# Patient Record
Sex: Male | Born: 1970
Health system: Southern US, Community
[De-identification: ages and names within clinical notes are randomized; demographics above are authoritative.]

## PROBLEM LIST (undated history)

## (undated) DIAGNOSIS — I639 Cerebral infarction, unspecified: Secondary | ICD-10-CM

## (undated) DIAGNOSIS — C8290 Follicular lymphoma, unspecified, unspecified site: Secondary | ICD-10-CM

## (undated) DIAGNOSIS — E7800 Pure hypercholesterolemia, unspecified: Secondary | ICD-10-CM

## (undated) DIAGNOSIS — R7303 Prediabetes: Secondary | ICD-10-CM

## (undated) DIAGNOSIS — M7989 Other specified soft tissue disorders: Secondary | ICD-10-CM

## (undated) DIAGNOSIS — R002 Palpitations: Secondary | ICD-10-CM

## (undated) DIAGNOSIS — M2550 Pain in unspecified joint: Secondary | ICD-10-CM

## (undated) DIAGNOSIS — Z91018 Allergy to other foods: Secondary | ICD-10-CM

## (undated) DIAGNOSIS — E119 Type 2 diabetes mellitus without complications: Secondary | ICD-10-CM

## (undated) DIAGNOSIS — R5383 Other fatigue: Secondary | ICD-10-CM

## (undated) DIAGNOSIS — Z9889 Other specified postprocedural states: Secondary | ICD-10-CM

## (undated) DIAGNOSIS — R0602 Shortness of breath: Secondary | ICD-10-CM

## (undated) DIAGNOSIS — C8280 Other types of follicular lymphoma, unspecified site: Secondary | ICD-10-CM

## (undated) DIAGNOSIS — E739 Lactose intolerance, unspecified: Secondary | ICD-10-CM

## (undated) DIAGNOSIS — G4730 Sleep apnea, unspecified: Secondary | ICD-10-CM

## (undated) DIAGNOSIS — T7840XA Allergy, unspecified, initial encounter: Secondary | ICD-10-CM

## (undated) DIAGNOSIS — F419 Anxiety disorder, unspecified: Secondary | ICD-10-CM

## (undated) DIAGNOSIS — R112 Nausea with vomiting, unspecified: Secondary | ICD-10-CM

## (undated) HISTORY — PX: SUTURE REMOVAL: SHX1060

## (undated) HISTORY — DX: Lactose intolerance, unspecified: E73.9

## (undated) HISTORY — DX: Other specified soft tissue disorders: M79.89

## (undated) HISTORY — DX: Cerebral infarction, unspecified: I63.9

## (undated) HISTORY — DX: Palpitations: R00.2

## (undated) HISTORY — PX: ACHILLES TENDON SURGERY: SHX542

## (undated) HISTORY — DX: Shortness of breath: R06.02

## (undated) HISTORY — DX: Prediabetes: R73.03

## (undated) HISTORY — DX: Type 2 diabetes mellitus without complications: E11.9

## (undated) HISTORY — DX: Allergy to other foods: Z91.018

## (undated) HISTORY — DX: Pain in unspecified joint: M25.50

## (undated) HISTORY — PX: HERNIA REPAIR: SHX51

## (undated) HISTORY — DX: Other types of follicular lymphoma, unspecified site: C82.80

## (undated) HISTORY — DX: Allergy, unspecified, initial encounter: T78.40XA

## (undated) HISTORY — DX: Other fatigue: R53.83

---
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
## 1992-02-03 HISTORY — PX: VENTRAL HERNIA REPAIR: SHX424

## 2009-10-17 ENCOUNTER — Encounter: Payer: Self-pay | Admitting: Family Medicine

## 2009-10-28 ENCOUNTER — Ambulatory Visit: Payer: Self-pay | Admitting: Sports Medicine

## 2009-10-28 ENCOUNTER — Encounter: Payer: Self-pay | Admitting: Family Medicine

## 2009-10-28 DIAGNOSIS — M7660 Achilles tendinitis, unspecified leg: Secondary | ICD-10-CM | POA: Insufficient documentation

## 2009-11-04 ENCOUNTER — Encounter: Payer: Self-pay | Admitting: Family Medicine

## 2010-01-12 ENCOUNTER — Encounter: Payer: Self-pay | Admitting: Family Medicine

## 2010-03-04 NOTE — Letter (Signed)
Summary: [facility]  [facility]   Imported By: Marily Memos 10/18/2009 11:51:56  _____________________________________________________________________  External Attachment:    Type:   Image     Comment:   External Document

## 2010-03-04 NOTE — Consult Note (Signed)
Summary: [facility]   Imported By: Marily Memos 11/11/2009 11:34:10  _____________________________________________________________________  External Attachment:    Type:   Image     Comment:   External Document

## 2010-03-04 NOTE — Letter (Signed)
Summary: [facility]  [facility]   Imported By: Marily Memos 10/29/2009 15:06:26  _____________________________________________________________________  External Attachment:    Type:   Image     Comment:   External Document

## 2010-03-04 NOTE — Assessment & Plan Note (Signed)
Summary: NP,U/S ACHILLIES TENDON RUPTURE,MC   Vital Signs:  Patient profile:   40 year old male Height:      71.75 inches Weight:      210 pounds BMI:     28.78 Pulse rate:   83 / minute BP sitting:   112 / 75  (right arm) CC: swollen l achilles x 1.5 months   CC:  swollen l achilles x 1.5 months.  History of Present Illness: Left achilles swelling with mild intermittent pain for several months. Increasing in size. recals no specific injury--did have surgery on this foot as a  child for congenital club foot--was in a cast for a long time and has had chronic calf atrophy since. Has nbeen wearing a cam walker boot for last week and that seems to help a litle bit. Hikes seveal times a week until about a mointh ago when this started to bother him. Tendon has continued to swell  in a nodular area.  No new activities. Recalls no specific injury.   Current Medications (verified): 1)  None  Past History:  Past Surgical History: right foot surgery as a child for congenital club foot  Social History: graduate srudent in accounting---former banker  Review of Systems  The patient denies anorexia, fever, and muscle weakness.         over last year he has lost 60 pounds, intentional and related to the stress of his employment status  Physical Exam  General:  alert, well-developed, well-nourished, and well-hydrated.   Msk:  LEFT calf is smaller than right, itis symmetrical with no obvious defect other than the 2 cm firm nodule in the achilles that is just proximal to the insertion of the achilles on the  calcaneus. Nonmobile. Plantar and dorsiflexion sytrength is 5/5.ROM is  intact i.  SKIN no redness, no lesions or skin changes around the area of the nodule.  NEURO: intact sensation to soft touch B in feet and lower legs  PUKSES: 2+ B = DP with normal cap refill. Additional Exam:  [place] Left achilles. Right achilles was ultrasounded for comparison and revealed a normal achilles in  architecture and size. The LEFT achilles was normal in architecture except fo rthe area of the nodule--at this area there was a swirl effect of the architecture that was inhomogeneous. No obvious defectnoted but definite abnormal architecture of  the nodule area seen on clinical exam. At the actual insertion onto the calcaneous, the tendon architecture and returned to normal.   Impression & Recommendations:  Problem # 1:  ACHILLES BURSITIS OR TENDINITIS (ICD-726.71)  Orders: Orthopedic Surgeon Referral (Ortho Surgeon) [place] LIMITED [phone]) This may be scar from a partial rupture or it could be from  his surgery as a child. Odd  taht it has en;larged so rapidly in last 1-2 months. Cannot definitively rule out intratendinous tumor. Refer. [place] images are printed and given to patient to take with him.  Patient Instructions: 1)  Appointment with Dr. Lajoyce Corners 11/04/09 @ 1:15pm. [address]. [phone].

## 2010-03-06 NOTE — Consult Note (Signed)
Summary: [facility]   Imported By: Marily Memos 01/14/2010 14:41:58  _____________________________________________________________________  External Attachment:    Type:   Image     Comment:   External Document

## 2011-06-25 DIAGNOSIS — Z720 Tobacco use: Secondary | ICD-10-CM | POA: Insufficient documentation

## 2015-11-12 ENCOUNTER — Other Ambulatory Visit: Payer: Self-pay | Admitting: Family

## 2015-11-12 ENCOUNTER — Emergency Department (HOSPITAL_COMMUNITY): Payer: 59 | Admitting: Anesthesiology

## 2015-11-12 ENCOUNTER — Encounter (HOSPITAL_COMMUNITY): Admission: EM | Disposition: A | Discharge status: Home / Self Care | Payer: Self-pay | Source: Home / Self Care

## 2015-11-12 ENCOUNTER — Encounter (HOSPITAL_COMMUNITY): Payer: Self-pay | Admitting: Emergency Medicine

## 2015-11-12 ENCOUNTER — Emergency Department (HOSPITAL_COMMUNITY): Payer: 59

## 2015-11-12 ENCOUNTER — Inpatient Hospital Stay (HOSPITAL_COMMUNITY)
Admission: EM | Admit: 2015-11-12 | Discharge: 2015-11-18 | DRG: 339 | Disposition: A | Discharge status: Home / Self Care | Payer: 59 | Attending: General Surgery | Admitting: General Surgery

## 2015-11-12 DIAGNOSIS — K3580 Unspecified acute appendicitis: Secondary | ICD-10-CM | POA: Diagnosis present

## 2015-11-12 DIAGNOSIS — K353 Acute appendicitis with localized peritonitis, without perforation or gangrene: Secondary | ICD-10-CM

## 2015-11-12 DIAGNOSIS — R1031 Right lower quadrant pain: Secondary | ICD-10-CM | POA: Diagnosis not present

## 2015-11-12 DIAGNOSIS — R339 Retention of urine, unspecified: Secondary | ICD-10-CM | POA: Diagnosis not present

## 2015-11-12 DIAGNOSIS — E669 Obesity, unspecified: Secondary | ICD-10-CM | POA: Diagnosis present

## 2015-11-12 DIAGNOSIS — R739 Hyperglycemia, unspecified: Secondary | ICD-10-CM | POA: Diagnosis present

## 2015-11-12 DIAGNOSIS — E785 Hyperlipidemia, unspecified: Secondary | ICD-10-CM | POA: Diagnosis present

## 2015-11-12 DIAGNOSIS — R000 Tachycardia, unspecified: Secondary | ICD-10-CM | POA: Diagnosis not present

## 2015-11-12 DIAGNOSIS — Z6830 Body mass index (BMI) 30.0-30.9, adult: Secondary | ICD-10-CM

## 2015-11-12 DIAGNOSIS — K567 Ileus, unspecified: Secondary | ICD-10-CM | POA: Diagnosis not present

## 2015-11-12 DIAGNOSIS — K3532 Acute appendicitis with perforation and localized peritonitis, without abscess: Secondary | ICD-10-CM | POA: Diagnosis present

## 2015-11-12 DIAGNOSIS — F17200 Nicotine dependence, unspecified, uncomplicated: Secondary | ICD-10-CM | POA: Diagnosis present

## 2015-11-12 HISTORY — DX: Pure hypercholesterolemia, unspecified: E78.00

## 2015-11-12 HISTORY — DX: Other specified postprocedural states: Z98.890

## 2015-11-12 HISTORY — DX: Sleep apnea, unspecified: G47.30

## 2015-11-12 HISTORY — PX: APPENDECTOMY: SHX54

## 2015-11-12 HISTORY — DX: Other specified postprocedural states: R11.2

## 2015-11-12 HISTORY — PX: LAPAROSCOPIC APPENDECTOMY: SHX408

## 2015-11-12 HISTORY — DX: Nausea with vomiting, unspecified: R11.2

## 2015-11-12 LAB — COMPREHENSIVE METABOLIC PANEL
ALBUMIN: 3.8 g/dL (ref 3.5–5.0)
ALK PHOS: 83 U/L (ref 38–126)
ALT: 17 U/L (ref 17–63)
ANION GAP: 8 (ref 5–15)
AST: 18 U/L (ref 15–41)
BUN: 8 mg/dL (ref 6–20)
CALCIUM: 9.4 mg/dL (ref 8.9–10.3)
CHLORIDE: 102 mmol/L (ref 101–111)
CO2: 28 mmol/L (ref 22–32)
Creatinine, Ser: 1.06 mg/dL (ref 0.61–1.24)
GFR calc Af Amer: >60 mL/min (ref >60)
GFR calc non Af Amer: >60 mL/min (ref >60)
GLUCOSE: 123 mg/dL — AB (ref 65–99)
Potassium: 4.6 mmol/L (ref 3.5–5.1)
SODIUM: 138 mmol/L (ref 135–145)
TOTAL PROTEIN: 7.1 g/dL (ref 6.5–8.1)
Total Bilirubin: 0.7 mg/dL (ref 0.3–1.2)

## 2015-11-12 LAB — CBC
HEMATOCRIT: 42.4 % (ref 39.0–52.0)
HEMOGLOBIN: 14.6 g/dL (ref 13.0–17.0)
MCH: 31.3 pg (ref 26.0–34.0)
MCHC: 34.4 g/dL (ref 30.0–36.0)
MCV: 90.8 fL (ref 78.0–100.0)
Platelets: 229 10*3/uL (ref 150–400)
RBC: 4.67 MIL/uL (ref 4.22–5.81)
RDW: 12.9 % (ref 11.5–15.5)
WBC: 12.9 10*3/uL — AB (ref 4.0–10.5)

## 2015-11-12 LAB — URINALYSIS, ROUTINE W REFLEX MICROSCOPIC
BILIRUBIN URINE: NEGATIVE
Glucose, UA: NEGATIVE mg/dL
Hgb urine dipstick: NEGATIVE
Ketones, ur: NEGATIVE mg/dL
LEUKOCYTES UA: NEGATIVE
NITRITE: NEGATIVE
PH: 6 (ref 5.0–8.0)
Protein, ur: NEGATIVE mg/dL
SPECIFIC GRAVITY, URINE: 1.01 (ref 1.005–1.030)

## 2015-11-12 LAB — LIPASE, BLOOD: LIPASE: 19 U/L (ref 11–51)

## 2015-11-12 LAB — GLUCOSE, CAPILLARY: Glucose-Capillary: 161 mg/dL — ABNORMAL HIGH (ref 65–99)

## 2015-11-12 SURGERY — APPENDECTOMY, LAPAROSCOPIC
Anesthesia: General | Site: Abdomen

## 2015-11-12 MED ORDER — BUPIVACAINE-EPINEPHRINE 0.25% -1:200000 IJ SOLN
INTRAMUSCULAR | Status: DC | PRN
  Administered 2015-11-12: 8 mL

## 2015-11-12 MED ORDER — LACTATED RINGERS IV SOLN
INTRAVENOUS | Status: DC
  Administered 2015-11-12 (×4): via INTRAVENOUS

## 2015-11-12 MED ORDER — INSULIN ASPART 100 UNIT/ML ~~LOC~~ SOLN
0.0000 [IU] | SUBCUTANEOUS | Status: DC
  Administered 2015-11-12: 2 [IU] via SUBCUTANEOUS
  Administered 2015-11-13 (×3): 1 [IU] via SUBCUTANEOUS
  Administered 2015-11-13: 2 [IU] via SUBCUTANEOUS
  Administered 2015-11-13: 1 [IU] via SUBCUTANEOUS
  Administered 2015-11-14 (×2): 2 [IU] via SUBCUTANEOUS
  Administered 2015-11-14: 1 [IU] via SUBCUTANEOUS
  Administered 2015-11-14 (×3): 2 [IU] via SUBCUTANEOUS
  Administered 2015-11-15 (×2): 1 [IU] via SUBCUTANEOUS
  Administered 2015-11-15 (×3): 2 [IU] via SUBCUTANEOUS
  Administered 2015-11-16: 1 [IU] via SUBCUTANEOUS

## 2015-11-12 MED ORDER — FENTANYL CITRATE (PF) 100 MCG/2ML IJ SOLN
INTRAMUSCULAR | Status: AC
  Filled 2015-11-12: qty 2

## 2015-11-12 MED ORDER — ROCURONIUM BROMIDE 100 MG/10ML IV SOLN
INTRAVENOUS | Status: DC | PRN
  Administered 2015-11-12 (×2): 10 mg via INTRAVENOUS
  Administered 2015-11-12: 20 mg via INTRAVENOUS
  Administered 2015-11-12: 10 mg via INTRAVENOUS
  Administered 2015-11-12: 50 mg via INTRAVENOUS
  Administered 2015-11-12: 20 mg via INTRAVENOUS
  Administered 2015-11-12: 30 mg via INTRAVENOUS

## 2015-11-12 MED ORDER — FAMOTIDINE IN NACL 20-0.9 MG/50ML-% IV SOLN
20.0000 mg | Freq: Two times a day (BID) | INTRAVENOUS | Status: DC
  Administered 2015-11-12 – 2015-11-17 (×10): 20 mg via INTRAVENOUS
  Filled 2015-11-12 (×11): qty 50

## 2015-11-12 MED ORDER — ACETAMINOPHEN 325 MG PO TABS
650.0000 mg | ORAL_TABLET | Freq: Four times a day (QID) | ORAL | Status: DC | PRN
  Filled 2015-11-12: qty 2

## 2015-11-12 MED ORDER — HYDROMORPHONE HCL 1 MG/ML IJ SOLN
2.0000 mg | Freq: Once | INTRAMUSCULAR | Status: AC
  Administered 2015-11-12: 2 mg via INTRAVENOUS
  Filled 2015-11-12: qty 2

## 2015-11-12 MED ORDER — MIDAZOLAM HCL 2 MG/2ML IJ SOLN
INTRAMUSCULAR | Status: AC
  Filled 2015-11-12: qty 2

## 2015-11-12 MED ORDER — SODIUM CHLORIDE 0.9 % IV BOLUS (SEPSIS)
1000.0000 mL | Freq: Once | INTRAVENOUS | Status: AC
  Administered 2015-11-12: 1000 mL via INTRAVENOUS

## 2015-11-12 MED ORDER — BUPIVACAINE-EPINEPHRINE 0.25% -1:200000 IJ SOLN
INTRAMUSCULAR | Status: AC
  Filled 2015-11-12: qty 1

## 2015-11-12 MED ORDER — MIDAZOLAM HCL 5 MG/5ML IJ SOLN
INTRAMUSCULAR | Status: DC | PRN
Start: 2015-11-12 — End: 2015-11-12
  Administered 2015-11-12: 2 mg via INTRAVENOUS

## 2015-11-12 MED ORDER — ACETAMINOPHEN 650 MG RE SUPP
650.0000 mg | Freq: Four times a day (QID) | RECTAL | Status: DC | PRN
  Administered 2015-11-13 (×2): 650 mg via RECTAL
  Filled 2015-11-12 (×2): qty 1

## 2015-11-12 MED ORDER — HYDROMORPHONE HCL 1 MG/ML IJ SOLN
0.5000 mg | INTRAMUSCULAR | Status: DC | PRN
  Administered 2015-11-13: 1 mg via INTRAVENOUS
  Administered 2015-11-13 (×2): 2 mg via INTRAVENOUS
  Filled 2015-11-12: qty 2
  Filled 2015-11-12: qty 1
  Filled 2015-11-12: qty 2

## 2015-11-12 MED ORDER — PHENYLEPHRINE HCL 10 MG/ML IJ SOLN
INTRAMUSCULAR | Status: DC | PRN
  Administered 2015-11-12 (×3): 80 ug via INTRAVENOUS

## 2015-11-12 MED ORDER — ONDANSETRON HCL 4 MG/2ML IJ SOLN: 4.0000 mg | Freq: Four times a day (QID) | INTRAMUSCULAR | Status: DC | PRN

## 2015-11-12 MED ORDER — PROMETHAZINE HCL 25 MG/ML IJ SOLN: 6.2500 mg | INTRAMUSCULAR | Status: DC | PRN

## 2015-11-12 MED ORDER — PROPOFOL 10 MG/ML IV BOLUS
INTRAVENOUS | Status: AC
  Filled 2015-11-12: qty 20

## 2015-11-12 MED ORDER — SUGAMMADEX SODIUM 500 MG/5ML IV SOLN
INTRAVENOUS | Status: DC | PRN
  Administered 2015-11-12: 200 mg via INTRAVENOUS

## 2015-11-12 MED ORDER — 0.9 % SODIUM CHLORIDE (POUR BTL) OPTIME
TOPICAL | Status: DC | PRN
  Administered 2015-11-12: 1000 mL

## 2015-11-12 MED ORDER — PROPOFOL 10 MG/ML IV BOLUS
INTRAVENOUS | Status: DC | PRN
  Administered 2015-11-12: 200 mg via INTRAVENOUS

## 2015-11-12 MED ORDER — SODIUM CHLORIDE 0.9 % IR SOLN
Status: DC | PRN
Start: 2015-11-12 — End: 2015-11-12
  Administered 2015-11-12 (×4): 1000 mL

## 2015-11-12 MED ORDER — FENTANYL CITRATE (PF) 100 MCG/2ML IJ SOLN
INTRAMUSCULAR | Status: AC
  Filled 2015-11-12: qty 4

## 2015-11-12 MED ORDER — SUGAMMADEX SODIUM 200 MG/2ML IV SOLN
INTRAVENOUS | Status: AC
  Filled 2015-11-12: qty 2

## 2015-11-12 MED ORDER — ONDANSETRON 4 MG PO TBDP: 4.0000 mg | ORAL_TABLET | Freq: Four times a day (QID) | ORAL | Status: DC | PRN

## 2015-11-12 MED ORDER — CEFOXITIN SODIUM 1 G IV SOLR
INTRAVENOUS | Status: AC
  Filled 2015-11-12 (×2): qty 1

## 2015-11-12 MED ORDER — PHENYLEPHRINE 40 MCG/ML (10ML) SYRINGE FOR IV PUSH (FOR BLOOD PRESSURE SUPPORT)
PREFILLED_SYRINGE | INTRAVENOUS | Status: AC
  Filled 2015-11-12: qty 10

## 2015-11-12 MED ORDER — DEXTROSE 5 % IV SOLN
2.0000 g | INTRAVENOUS | Status: AC
  Administered 2015-11-12: 2 g via INTRAVENOUS
  Filled 2015-11-12: qty 2

## 2015-11-12 MED ORDER — MORPHINE SULFATE (PF) 2 MG/ML IV SOLN
1.0000 mg | INTRAVENOUS | Status: DC | PRN
  Administered 2015-11-12: 2 mg via INTRAVENOUS
  Filled 2015-11-12: qty 1

## 2015-11-12 MED ORDER — KCL IN DEXTROSE-NACL 20-5-0.45 MEQ/L-%-% IV SOLN
INTRAVENOUS | Status: DC
  Administered 2015-11-12 – 2015-11-14 (×5): via INTRAVENOUS
  Administered 2015-11-15: 1 mL via INTRAVENOUS
  Administered 2015-11-15 – 2015-11-18 (×5): via INTRAVENOUS
  Filled 2015-11-12 (×11): qty 1000

## 2015-11-12 MED ORDER — ONDANSETRON HCL 4 MG/2ML IJ SOLN
INTRAMUSCULAR | Status: DC | PRN
  Administered 2015-11-12: 4 mg via INTRAVENOUS

## 2015-11-12 MED ORDER — DIPHENHYDRAMINE HCL 50 MG/ML IJ SOLN: 12.5000 mg | Freq: Four times a day (QID) | INTRAMUSCULAR | Status: DC | PRN

## 2015-11-12 MED ORDER — FENTANYL CITRATE (PF) 100 MCG/2ML IJ SOLN
INTRAMUSCULAR | Status: DC | PRN
  Administered 2015-11-12 (×2): 50 ug via INTRAVENOUS
  Administered 2015-11-12: 100 ug via INTRAVENOUS
  Administered 2015-11-12 (×8): 50 ug via INTRAVENOUS

## 2015-11-12 MED ORDER — ONDANSETRON HCL 4 MG/2ML IJ SOLN
4.0000 mg | Freq: Four times a day (QID) | INTRAMUSCULAR | Status: DC | PRN
  Administered 2015-11-14: 4 mg via INTRAVENOUS
  Filled 2015-11-12 (×2): qty 2

## 2015-11-12 MED ORDER — DIPHENHYDRAMINE HCL 12.5 MG/5ML PO ELIX: 12.5000 mg | ORAL_SOLUTION | Freq: Four times a day (QID) | ORAL | Status: DC | PRN

## 2015-11-12 MED ORDER — ENOXAPARIN SODIUM 40 MG/0.4ML ~~LOC~~ SOLN
40.0000 mg | SUBCUTANEOUS | Status: DC
Start: 2015-11-13 — End: 2015-11-18
  Administered 2015-11-13 – 2015-11-17 (×5): 40 mg via SUBCUTANEOUS
  Filled 2015-11-12 (×5): qty 0.4

## 2015-11-12 MED ORDER — IOPAMIDOL (ISOVUE-300) INJECTION 61%
INTRAVENOUS | Status: AC
  Administered 2015-11-12: 100 mL
  Filled 2015-11-12: qty 100

## 2015-11-12 MED ORDER — PIPERACILLIN-TAZOBACTAM 3.375 G IVPB
3.3750 g | Freq: Three times a day (TID) | INTRAVENOUS | Status: DC
  Administered 2015-11-12 – 2015-11-18 (×17): 3.375 g via INTRAVENOUS
  Filled 2015-11-12 (×19): qty 50

## 2015-11-12 MED ORDER — SUCCINYLCHOLINE CHLORIDE 20 MG/ML IJ SOLN
INTRAMUSCULAR | Status: DC | PRN
  Administered 2015-11-12: 120 mg via INTRAVENOUS

## 2015-11-12 MED ORDER — FENTANYL CITRATE (PF) 100 MCG/2ML IJ SOLN
25.0000 ug | INTRAMUSCULAR | Status: DC | PRN
Start: 2015-11-12 — End: 2015-11-12
  Administered 2015-11-12 (×2): 50 ug via INTRAVENOUS

## 2015-11-12 MED ORDER — PROMETHAZINE HCL 25 MG/ML IJ SOLN: 12.5000 mg | Freq: Four times a day (QID) | INTRAMUSCULAR | Status: DC | PRN

## 2015-11-12 SURGICAL SUPPLY — 70 items
APPLIER CLIP ROT 10 11.4 M/L (STAPLE) ×3
BANDAGE ADH SHEER 1  50/CT (GAUZE/BANDAGES/DRESSINGS) IMPLANT
BENZOIN TINCTURE PRP APPL 2/3 (GAUZE/BANDAGES/DRESSINGS) ×3 IMPLANT
BLADE SURG ROTATE 9660 (MISCELLANEOUS) ×3 IMPLANT
CANISTER SUCTION 2500CC (MISCELLANEOUS) ×3 IMPLANT
CHLORAPREP W/TINT 26ML (MISCELLANEOUS) ×3 IMPLANT
CLIP APPLIE ROT 10 11.4 M/L (STAPLE) ×1 IMPLANT
CLOSURE WOUND 1/2 X4 (GAUZE/BANDAGES/DRESSINGS)
COVER SURGICAL LIGHT HANDLE (MISCELLANEOUS) ×3 IMPLANT
CUTTER FLEX LINEAR 45M (STAPLE) ×3 IMPLANT
DERMABOND ADVANCED (GAUZE/BANDAGES/DRESSINGS)
DERMABOND ADVANCED .7 DNX12 (GAUZE/BANDAGES/DRESSINGS) IMPLANT
DEVICE TROCAR PUNCTURE CLOSURE (ENDOMECHANICALS) ×3 IMPLANT
DRAIN CHANNEL 19F RND (DRAIN) ×3 IMPLANT
DRAPE HALF SHEET 40X57 (DRAPES) ×3 IMPLANT
DRSG TEGADERM 2-3/8X2-3/4 SM (GAUZE/BANDAGES/DRESSINGS) ×6 IMPLANT
DRSG TEGADERM 4X4.75 (GAUZE/BANDAGES/DRESSINGS) IMPLANT
ELECT REM PT RETURN 9FT ADLT (ELECTROSURGICAL) ×3
ELECTRODE REM PT RTRN 9FT ADLT (ELECTROSURGICAL) ×1 IMPLANT
ENDOLOOP SUT PDS II  0 18 (SUTURE) ×2
ENDOLOOP SUT PDS II 0 18 (SUTURE) ×1 IMPLANT
EVACUATOR SILICONE 100CC (DRAIN) ×3 IMPLANT
GAUZE SPONGE 2X2 8PLY STRL LF (GAUZE/BANDAGES/DRESSINGS) ×1 IMPLANT
GLOVE BIOGEL M STRL SZ7.5 (GLOVE) ×3 IMPLANT
GLOVE BIOGEL PI IND STRL 7.5 (GLOVE) ×1 IMPLANT
GLOVE BIOGEL PI IND STRL 8 (GLOVE) ×1 IMPLANT
GLOVE BIOGEL PI IND STRL 8.5 (GLOVE) ×1 IMPLANT
GLOVE BIOGEL PI INDICATOR 7.5 (GLOVE) ×2
GLOVE BIOGEL PI INDICATOR 8 (GLOVE) ×2
GLOVE BIOGEL PI INDICATOR 8.5 (GLOVE) ×2
GLOVE ECLIPSE 6.5 STRL STRAW (GLOVE) ×3 IMPLANT
GLOVE SURG SS PI 7.5 STRL IVOR (GLOVE) ×3 IMPLANT
GOWN BRE IMP SLV AUR LG STRL (GOWN DISPOSABLE) ×9 IMPLANT
GOWN STRL REUS W/ TWL LRG LVL3 (GOWN DISPOSABLE) ×2 IMPLANT
GOWN STRL REUS W/ TWL LRG LVL4 (GOWN DISPOSABLE) ×1 IMPLANT
GOWN STRL REUS W/TWL 2XL LVL3 (GOWN DISPOSABLE) ×3 IMPLANT
GOWN STRL REUS W/TWL LRG LVL3 (GOWN DISPOSABLE) ×4
GOWN STRL REUS W/TWL LRG LVL4 (GOWN DISPOSABLE) ×2
GRASPER SUT TROCAR 14GX15 (MISCELLANEOUS) ×3 IMPLANT
KIT BASIN OR (CUSTOM PROCEDURE TRAY) ×3 IMPLANT
KIT ROOM TURNOVER OR (KITS) ×3 IMPLANT
NS IRRIG 1000ML POUR BTL (IV SOLUTION) ×3 IMPLANT
PAD ARMBOARD 7.5X6 YLW CONV (MISCELLANEOUS) ×6 IMPLANT
POUCH RETRIEVAL ECOSAC 10 (ENDOMECHANICALS) ×1 IMPLANT
POUCH RETRIEVAL ECOSAC 10MM (ENDOMECHANICALS) ×2
POUCH SPECIMEN RETRIEVAL 10MM (ENDOMECHANICALS) ×6 IMPLANT
RELOAD 45 VASCULAR/THIN (ENDOMECHANICALS) IMPLANT
RELOAD STAPLE TA45 3.5 REG BLU (ENDOMECHANICALS) IMPLANT
RELOAD STAPLER GREEN 60MM (STAPLE) ×2 IMPLANT
SCALPEL HARMONIC ACE (MISCELLANEOUS) ×3 IMPLANT
SCISSORS LAP 5X35 DISP (ENDOMECHANICALS) ×6 IMPLANT
SET IRRIG TUBING LAPAROSCOPIC (IRRIGATION / IRRIGATOR) ×3 IMPLANT
SPECIMEN JAR SMALL (MISCELLANEOUS) ×3 IMPLANT
SPONGE GAUZE 2X2 STER 10/PKG (GAUZE/BANDAGES/DRESSINGS) ×2
SPONGE GAUZE 4X4 12PLY STER LF (GAUZE/BANDAGES/DRESSINGS) ×3 IMPLANT
STAPLE ECHEON FLEX 60 POW ENDO (STAPLE) ×6 IMPLANT
STAPLER RELOAD GREEN 60MM (STAPLE) ×6
STRIP CLOSURE SKIN 1/2X4 (GAUZE/BANDAGES/DRESSINGS) IMPLANT
SUT ETHILON 2 0 FS 18 (SUTURE) ×3 IMPLANT
SUT MNCRL AB 4-0 PS2 18 (SUTURE) ×3 IMPLANT
SUT VICRYL 0 UR6 27IN ABS (SUTURE) ×9 IMPLANT
TAPE STRIPS DRAPE STRL (GAUZE/BANDAGES/DRESSINGS) ×3 IMPLANT
TOWEL OR 17X24 6PK STRL BLUE (TOWEL DISPOSABLE) ×3 IMPLANT
TOWEL OR 17X26 10 PK STRL BLUE (TOWEL DISPOSABLE) ×3 IMPLANT
TRAY FOLEY CATH 16FR SILVER (SET/KITS/TRAYS/PACK) ×3 IMPLANT
TRAY LAPAROSCOPIC MC (CUSTOM PROCEDURE TRAY) ×3 IMPLANT
TROCAR XCEL 12X100 BLDLESS (ENDOMECHANICALS) ×3 IMPLANT
TROCAR XCEL BLADELESS 5X75MML (TROCAR) ×6 IMPLANT
TROCAR XCEL BLUNT TIP 100MML (ENDOMECHANICALS) ×3 IMPLANT
TUBING INSUFFLATION (TUBING) ×3 IMPLANT

## 2015-11-12 NOTE — ED Notes (Signed)
Dr wilson at bedside

## 2015-11-12 NOTE — ED Notes (Signed)
Patient transported to CT 

## 2015-11-12 NOTE — Anesthesia Preprocedure Evaluation (Addendum)
Anesthesia Evaluation  Patient identified by MRN, date of birth, ID band Patient awake    Reviewed: Allergy & Precautions, NPO status , Patient's Chart, lab work & pertinent test results  History of Anesthesia Complications (+) PONV and history of anesthetic complications  Airway Mallampati: II  TM Distance: >3 FB Neck ROM: Full    Dental  (+) Teeth Intact, Dental Advisory Given   Pulmonary Current Smoker,    Pulmonary exam normal breath sounds clear to auscultation       Cardiovascular Exercise Tolerance: Good Normal cardiovascular exam Rhythm:Regular Rate:Normal  HLD   Neuro/Psych negative neurological ROS  negative psych ROS   GI/Hepatic Neg liver ROS, Appendicitis  previous ventral hernia repair with some sort of mesh in the 90's   Endo/Other  negative endocrine ROS  Renal/GU negative Renal ROS     Musculoskeletal negative musculoskeletal ROS (+)   Abdominal   Peds  Hematology negative hematology ROS (+)   Anesthesia Other Findings Day of surgery medications reviewed with the patient.  Reproductive/Obstetrics                            Anesthesia Physical Anesthesia Plan  ASA: II and emergent  Anesthesia Plan: General   Post-op Pain Management:    Induction: Intravenous and Rapid sequence  Airway Management Planned: Oral ETT  Additional Equipment:   Intra-op Plan:   Post-operative Plan: Extubation in OR  Informed Consent: I have reviewed the patients History and Physical, chart, labs and discussed the procedure including the risks, benefits and alternatives for the proposed anesthesia with the patient or authorized representative who has indicated his/her understanding and acceptance.   Dental advisory given  Plan Discussed with: CRNA  Anesthesia Plan Comments: (Risks/benefits of general anesthesia discussed with patient including risk of damage to teeth, lips, gum,  and tongue, nausea/vomiting, allergic reactions to medications, and the possibility of heart attack, stroke and death.  All patient questions answered.  Patient wishes to proceed.  Last meal 1100.  Will proceed as case is emergent.)       Anesthesia Quick Evaluation

## 2015-11-12 NOTE — Transfer of Care (Signed)
Immediate Anesthesia Transfer of Care Note  Patient: [facility]  Procedure(s) Performed: Procedure(s): APPENDECTOMY LAPAROSCOPIC (N/A)  Patient Location: PACU  Anesthesia Type:General  Level of Consciousness: awake  Airway & Oxygen Therapy: Patient Spontanous Breathing  Post-op Assessment: Report given to RN and Post -op Vital signs reviewed and stable  Post vital signs: Reviewed and stable  Last Vitals:  Vitals:   11/12/15 1500 11/12/15 2049  BP: 121/63   Pulse: 95   Resp: 16   Temp:  36.5 C    Last Pain:  Vitals:   11/12/15 1453  TempSrc: Oral  PainSc:          Complications: No apparent anesthesia complications

## 2015-11-12 NOTE — Brief Op Note (Signed)
11/12/2015  9:03 PM  PATIENT:  [facility]  45 y.o. male  PRE-OPERATIVE DIAGNOSIS:  Appendicitis  POST-OPERATIVE DIAGNOSIS:  Ruptured Appendicitis  PROCEDURE:  Procedure(s): APPENDECTOMY LAPAROSCOPIC (N/A)  SURGEON:  Surgeon(s) and Role:    * Greer Pickerel, MD - Primary  PHYSICIAN ASSISTANT:   ASSISTANTS: none  ANESTHESIA:   general  EBL:  Total I/O In: 1400 [I.V.:1400] Out: 175 [Urine:75; Blood:100]  BLOOD ADMINISTERED:none  DRAINS: (19 round) Jackson-Pratt drain(s) with closed bulb suction in the RLQ and Urinary Catheter (Foley)   LOCAL MEDICATIONS USED:  MARCAINE     SPECIMEN:  Source of Specimen:  appendix in pieces  DISPOSITION OF SPECIMEN:  PATHOLOGY  COUNTS:  YES  TOURNIQUET:  * No tourniquets in log *  DICTATION: .Other Dictation: Dictation Number [id number]  PLAN OF CARE: Admit to inpatient   PATIENT DISPOSITION:  PACU - hemodynamically stable.   Delay start of Pharmacological VTE agent (>24hrs) due to surgical blood loss or risk of bleeding: no  [name], MD, FACS General, Bariatric, & Minimally Invasive Surgery [facility], [place]

## 2015-11-12 NOTE — ED Provider Notes (Signed)
[facility] DEPT Provider Note   CSN: [id number] Arrival date & time: 11/12/15  1150     History   Chief Complaint Chief Complaint  Patient presents with  . Abdominal Pain    HPI James Jennings is a 45 y.o. male.  Patient is a 45 year old male who presents with abdominal pain. He states over the last 2 days he's had waxing and waning worsening pain to his right lower abdomen. It's an achy pain. He has no nausea or vomiting. He's having normal bowel movements although he's had a couple of loose stools. No urinary symptoms. He's had temperatures of 99 with some associated chills. He denies any prior abdominal surgeries. He has not really been taking anything at home for the pain. He saw his PCP who sent him here for further evaluation.    Abdominal Pain   Pertinent negatives include fever, diarrhea, nausea, vomiting, frequency, hematuria, headaches and arthralgias.    History reviewed. No pertinent past medical history.  Patient Active Problem List   Diagnosis Date Noted  . ACHILLES BURSITIS OR TENDINITIS 10/28/2009    History reviewed. No pertinent surgical history.     Home Medications    Prior to Admission medications   Not on File    Family History History reviewed. No pertinent family history.  Social History Social History  Substance Use Topics  . Smoking status: Current Every Day Smoker  . Smokeless tobacco: Never Used  . Alcohol use Yes     Allergies   Shellfish allergy   Review of Systems Review of Systems  Constitutional: Negative for chills, diaphoresis, fatigue and fever.  HENT: Negative for congestion, rhinorrhea and sneezing.   Eyes: Negative.   Respiratory: Negative for cough, chest tightness and shortness of breath.   Cardiovascular: Negative for chest pain and leg swelling.  Gastrointestinal: Positive for abdominal pain. Negative for blood in stool, diarrhea, nausea and vomiting.  Genitourinary: Negative for difficulty urinating, flank  pain, frequency and hematuria.  Musculoskeletal: Negative for arthralgias and back pain.  Skin: Negative for rash.  Neurological: Negative for dizziness, speech difficulty, weakness, numbness and headaches.     Physical Exam Updated Vital Signs BP 107/68   Pulse 90   Temp 98.9 F (37.2 C) (Oral)   Resp 13   SpO2 97%   Physical Exam  Constitutional: He is oriented to person, place, and time. He appears well-developed and well-nourished.  HENT:  Head: Normocephalic and atraumatic.  Eyes: Pupils are equal, round, and reactive to light.  Neck: Normal range of motion. Neck supple.  Cardiovascular: Normal rate, regular rhythm and normal heart sounds.   Pulmonary/Chest: Effort normal and breath sounds normal. No respiratory distress. He has no wheezes. He has no rales. He exhibits no tenderness.  Abdominal: Soft. Bowel sounds are normal. There is tenderness (Moderate tenderness to the right lower quadrant). There is no rebound and no guarding.  Musculoskeletal: Normal range of motion. He exhibits no edema.  Lymphadenopathy:    He has no cervical adenopathy.  Neurological: He is alert and oriented to person, place, and time.  Skin: Skin is warm and dry. No rash noted.  Psychiatric: He has a normal mood and affect.     ED Treatments / Results  Labs (all labs ordered are listed, but only abnormal results are displayed) Labs Reviewed  COMPREHENSIVE METABOLIC PANEL - Abnormal; Notable for the following:       Result Value   Glucose, Bld 123 (*)    All other components within  normal limits  CBC - Abnormal; Notable for the following:    WBC 12.9 (*)    All other components within normal limits  LIPASE, BLOOD  URINALYSIS, ROUTINE W REFLEX MICROSCOPIC (NOT AT [facility])    EKG  EKG Interpretation None       Radiology Ct Abdomen Pelvis W Contrast  Result Date: 11/12/2015 CLINICAL DATA:  Right lower quadrant abdominal pain for 3 days with fever. History of ventral abdominal  hernia repair. EXAM: CT ABDOMEN AND PELVIS WITH CONTRAST TECHNIQUE: Multidetector CT imaging of the abdomen and pelvis was performed using the standard protocol following bolus administration of intravenous contrast. CONTRAST:  167mL ISOVUE-300 IOPAMIDOL (ISOVUE-300) INJECTION 61% COMPARISON:  None. FINDINGS: Lower chest: Calcified 10 mm left lower lobe granuloma. Hepatobiliary: Normal liver with no liver mass. Normal gallbladder with no radiopaque cholelithiasis. No biliary ductal dilatation. Pancreas: Normal, with no mass or duct dilation. Spleen: Normal size. No mass. Adrenals/Urinary Tract: Normal adrenals. Normal kidneys with no hydronephrosis and no renal mass. Normal bladder. Stomach/Bowel: Grossly normal stomach. Normal caliber small bowel. Appendix is prominently dilated (23 mm diameter). Diffuse appendiceal wall thickening. Prominent periappendiceal fat stranding. Findings are consistent with acute appendicitis. Mild reactive wall thickening in the terminal ileum and base of cecum. Vascular/Lymphatic: Atherosclerotic nonaneurysmal abdominal aorta. Patent portal, splenic, hepatic and renal veins. There is confluent soft tissue density partial encasing the lower inferior vena cava and lower abdominal aorta near the bifurcation, measuring up to the 2.4 cm thickness (series 2/ image 54). Otherwise no pathologically enlarged abdominopelvic lymph nodes. Reproductive: Normal size prostate. Other: No pneumoperitoneum, ascites or focal fluid collection. Musculoskeletal: No aggressive appearing focal osseous lesions. Mild thoracolumbar spondylosis. IMPRESSION: 1. Acute appendicitis.  No abscess or free intraperitoneal air. 2. Confluent soft tissue density partially encasing the lower IVC and abdominal aorta near the bifurcation, worrisome for lymphoma. PET-CT would be useful for further characterization, although should be delayed until the patient has recovered from the acute appendicitis. 3. Aortic  atherosclerosis. These results were called by telephone at the time of interpretation on 11/12/2015 at 2:21 pm to Dr. Threasa Beards Ouita Nish , who verbally acknowledged these results. Electronically Signed   By: Ilona Sorrel M.D.   On: 11/12/2015 14:23    Procedures Procedures (including critical care time)  Medications Ordered in ED Medications  sodium chloride 0.9 % bolus 1,000 mL (1,000 mLs Intravenous New Bag/Given 11/12/15 1336)  iopamidol (ISOVUE-300) 61 % injection (100 mLs  Contrast Given 11/12/15 1359)     Initial Impression / Assessment and Plan / ED Course  I have reviewed the triage vital signs and the nursing notes.  Pertinent labs & imaging results that were available during my care of the patient were reviewed by me and considered in my medical decision making (see chart for details).  Clinical Course    Patient has evidence of appendicitis on CT. I contacted the surgery team who will see the patient. I also notified them that patient does have a soft tissue mass that is present as well. I discussed this with the patient as well.  Final Clinical Impressions(s) / ED Diagnoses   Final diagnoses:  Acute appendicitis with localized peritonitis    New Prescriptions New Prescriptions   No medications on file     Malvin Johns, MD 11/12/15 1447

## 2015-11-12 NOTE — Anesthesia Procedure Notes (Signed)
Procedure Name: Intubation Date/Time: 11/12/2015 4:47 PM Performed by: Ollen Bowl Pre-anesthesia Checklist: Patient identified, Emergency Drugs available, Suction available, Patient being monitored and Timeout performed Patient Re-evaluated:Patient Re-evaluated prior to inductionOxygen Delivery Method: Circle system utilized and Simple face mask Preoxygenation: Pre-oxygenation with 100% oxygen Intubation Type: IV induction, Rapid sequence and Cricoid Pressure applied Laryngoscope Size: Miller and 3 Grade View: Grade I Tube type: Oral Tube size: 7.5 mm Number of attempts: 1 Airway Equipment and Method: Patient positioned with wedge pillow and Stylet Placement Confirmation: ETT inserted through vocal cords under direct vision,  positive ETCO2 and breath sounds checked- equal and bilateral Secured at: 22 cm Tube secured with: Tape Dental Injury: Teeth and Oropharynx as per pre-operative assessment

## 2015-11-12 NOTE — H&P (Signed)
James [facility] is an 45 y.o. male.   Chief Complaint: Abd pain HPI: [facility] was in his usual state of health until he woke up Saturday morning with RLQ abd pain. This has been present constantly since then though has gotten worse with eating/drinking and certain movements. Denies N/V. Has had some low-grade fevers, chills, and sweats. He saw his PCP yesterday who ordered a CT scan but due to a delay in obtaining insurance authorization they told him to come to ED today. A CT scan showed acute appendicitis and incidentally a soft tissue mass adjacent to the aorta.  He admits to a previous ventral hernia repair with some sort of mesh in the 90's.  History reviewed. No pertinent past medical history.  History reviewed. No pertinent surgical history.  History reviewed. No pertinent family history. Social History:  reports that he has been smoking.  He has never used smokeless tobacco. He reports that he drinks alcohol. He reports that he does not use drugs.  Allergies:  Allergies  Allergen Reactions  . Shellfish Allergy     Results for orders placed or performed during the hospital encounter of 11/12/15 (from the past 48 hour(s))  Lipase, blood     Status: None   Collection Time: 11/12/15 12:15 PM  Result Value Ref Range   Lipase 19 11 - 51 U/L  Comprehensive metabolic panel     Status: Abnormal   Collection Time: 11/12/15 12:15 PM  Result Value Ref Range   Sodium 138 135 - 145 mmol/L   Potassium 4.6 3.5 - 5.1 mmol/L   Chloride 102 101 - 111 mmol/L   CO2 28 22 - 32 mmol/L   Glucose, Bld 123 (H) 65 - 99 mg/dL   BUN 8 6 - 20 mg/dL   Creatinine, Ser 1.06 0.61 - 1.24 mg/dL   Calcium 9.4 8.9 - 10.3 mg/dL   Total Protein 7.1 6.5 - 8.1 g/dL   Albumin 3.8 3.5 - 5.0 g/dL   AST 18 15 - 41 U/L   ALT 17 17 - 63 U/L   Alkaline Phosphatase 83 38 - 126 U/L   Total Bilirubin 0.7 0.3 - 1.2 mg/dL   GFR calc non Af Amer >60 >60 mL/min   GFR calc Af Amer >60 >60 mL/min    Comment: (NOTE) The eGFR has  been calculated using the CKD EPI equation. This calculation has not been validated in all clinical situations. eGFR's persistently <60 mL/min signify possible Chronic Kidney Disease.    Anion gap 8 5 - 15  CBC     Status: Abnormal   Collection Time: 11/12/15 12:15 PM  Result Value Ref Range   WBC 12.9 (H) 4.0 - 10.5 K/uL   RBC 4.67 4.22 - 5.81 MIL/uL   Hemoglobin 14.6 13.0 - 17.0 g/dL   HCT 42.4 39.0 - 52.0 %   MCV 90.8 78.0 - 100.0 fL   MCH 31.3 26.0 - 34.0 pg   MCHC 34.4 30.0 - 36.0 g/dL   RDW 12.9 11.5 - 15.5 %   Platelets 229 150 - 400 K/uL   Ct Abdomen Pelvis W Contrast  Result Date: 11/12/2015 CLINICAL DATA:  Right lower quadrant abdominal pain for 3 days with fever. History of ventral abdominal hernia repair. EXAM: CT ABDOMEN AND PELVIS WITH CONTRAST TECHNIQUE: Multidetector CT imaging of the abdomen and pelvis was performed using the standard protocol following bolus administration of intravenous contrast. CONTRAST:  138m ISOVUE-300 IOPAMIDOL (ISOVUE-300) INJECTION 61% COMPARISON:  None. FINDINGS: Lower chest: Calcified  10 mm left lower lobe granuloma. Hepatobiliary: Normal liver with no liver mass. Normal gallbladder with no radiopaque cholelithiasis. No biliary ductal dilatation. Pancreas: Normal, with no mass or duct dilation. Spleen: Normal size. No mass. Adrenals/Urinary Tract: Normal adrenals. Normal kidneys with no hydronephrosis and no renal mass. Normal bladder. Stomach/Bowel: Grossly normal stomach. Normal caliber small bowel. Appendix is prominently dilated (23 mm diameter). Diffuse appendiceal wall thickening. Prominent periappendiceal fat stranding. Findings are consistent with acute appendicitis. Mild reactive wall thickening in the terminal ileum and base of cecum. Vascular/Lymphatic: Atherosclerotic nonaneurysmal abdominal aorta. Patent portal, splenic, hepatic and renal veins. There is confluent soft tissue density partial encasing the lower inferior vena cava and  lower abdominal aorta near the bifurcation, measuring up to the 2.4 cm thickness (series 2/ image 54). Otherwise no pathologically enlarged abdominopelvic lymph nodes. Reproductive: Normal size prostate. Other: No pneumoperitoneum, ascites or focal fluid collection. Musculoskeletal: No aggressive appearing focal osseous lesions. Mild thoracolumbar spondylosis. IMPRESSION: 1. Acute appendicitis.  No abscess or free intraperitoneal air. 2. Confluent soft tissue density partially encasing the lower IVC and abdominal aorta near the bifurcation, worrisome for lymphoma. PET-CT would be useful for further characterization, although should be delayed until the patient has recovered from the acute appendicitis. 3. Aortic atherosclerosis. These results were called by telephone at the time of interpretation on 11/12/2015 at 2:21 pm to Dr. Threasa Beards BELFI , who verbally acknowledged these results. Electronically Signed   By: Ilona Sorrel M.D.   On: 11/12/2015 14:23    Review of Systems  Constitutional: Positive for chills and fever. Negative for weight loss.  HENT: Negative for sore throat.   Eyes: Negative for blurred vision.  Respiratory: Negative for cough.   Cardiovascular: Negative for chest pain.  Gastrointestinal: Positive for abdominal pain. Negative for blood in stool, constipation, diarrhea, nausea and vomiting.  Genitourinary: Negative for dysuria.  Musculoskeletal: Negative for myalgias.  Neurological: Positive for dizziness.  Endo/Heme/Allergies: Does not bruise/bleed easily.  Psychiatric/Behavioral: The patient does not have insomnia.     Blood pressure 132/74, pulse 95, temperature 98.7 F (37.1 C), temperature source Oral, resp. rate 19, SpO2 97 %. Physical Exam  Constitutional: He appears well-developed and well-nourished. No distress.  HENT:  Head: Normocephalic and atraumatic.  Right Ear: External ear normal.  Left Ear: External ear normal.  Mouth/Throat: Oropharynx is clear and moist.  No oropharyngeal exudate.  Eyes: Conjunctivae are normal. Right eye exhibits no discharge. Left eye exhibits no discharge. No scleral icterus.  Neck: Normal range of motion. Neck supple.  Cardiovascular: Normal rate, regular rhythm and normal heart sounds.  Exam reveals no gallop and no friction rub.   No murmur heard. Respiratory: Effort normal and breath sounds normal.  GI: Soft. Bowel sounds are decreased. There is tenderness in the right lower quadrant. There is tenderness at McBurney's point. There is no rigidity, no rebound and no guarding.  Musculoskeletal: Normal range of motion.  Lymphadenopathy:    He has no cervical adenopathy.  Neurological: He is alert.  Skin: Skin is warm and dry. He is not diaphoretic.  Psychiatric: He has a normal mood and affect. His behavior is normal.     Assessment/Plan Acute appendicitis -- Went over surgical and medical management and risks/benefits of both. He would like to proceed with appendectomy. RP mass -- CT worrisome for lymphoma. Will need OP workup. Hyperlipidemia -- Treating with diet Elevated blood sugars -- Will put on SSI, just treating with diet at home for now. New dx.  Tobacco use -- Counseled cessation    Lisette Abu, PA-C Pager: [phone] 11/12/2015, 2:59 PM

## 2015-11-12 NOTE — ED Triage Notes (Signed)
Pt c/o RLQ pain x 3 days with fever

## 2015-11-13 ENCOUNTER — Encounter (HOSPITAL_COMMUNITY): Payer: Self-pay | Admitting: General Surgery

## 2015-11-13 DIAGNOSIS — R739 Hyperglycemia, unspecified: Secondary | ICD-10-CM | POA: Diagnosis present

## 2015-11-13 DIAGNOSIS — Z6830 Body mass index (BMI) 30.0-30.9, adult: Secondary | ICD-10-CM | POA: Diagnosis not present

## 2015-11-13 DIAGNOSIS — E785 Hyperlipidemia, unspecified: Secondary | ICD-10-CM | POA: Diagnosis present

## 2015-11-13 DIAGNOSIS — K353 Acute appendicitis with localized peritonitis: Secondary | ICD-10-CM | POA: Diagnosis present

## 2015-11-13 DIAGNOSIS — R000 Tachycardia, unspecified: Secondary | ICD-10-CM | POA: Diagnosis not present

## 2015-11-13 DIAGNOSIS — K567 Ileus, unspecified: Secondary | ICD-10-CM | POA: Diagnosis not present

## 2015-11-13 DIAGNOSIS — R339 Retention of urine, unspecified: Secondary | ICD-10-CM | POA: Diagnosis not present

## 2015-11-13 DIAGNOSIS — K3532 Acute appendicitis with perforation and localized peritonitis, without abscess: Secondary | ICD-10-CM | POA: Diagnosis present

## 2015-11-13 DIAGNOSIS — R1031 Right lower quadrant pain: Secondary | ICD-10-CM | POA: Diagnosis present

## 2015-11-13 DIAGNOSIS — E669 Obesity, unspecified: Secondary | ICD-10-CM | POA: Diagnosis present

## 2015-11-13 DIAGNOSIS — F17200 Nicotine dependence, unspecified, uncomplicated: Secondary | ICD-10-CM | POA: Diagnosis present

## 2015-11-13 LAB — GLUCOSE, CAPILLARY
GLUCOSE-CAPILLARY: 169 mg/dL — AB (ref 65–99)
GLUCOSE-CAPILLARY: 142 mg/dL — AB (ref 65–99)
GLUCOSE-CAPILLARY: 129 mg/dL — AB (ref 65–99)
Glucose-Capillary: 140 mg/dL — ABNORMAL HIGH (ref 65–99)
Glucose-Capillary: 148 mg/dL — ABNORMAL HIGH (ref 65–99)

## 2015-11-13 LAB — BASIC METABOLIC PANEL
Anion gap: 6 (ref 5–15)
BUN: 6 mg/dL (ref 6–20)
CALCIUM: 8.1 mg/dL — AB (ref 8.9–10.3)
CO2: 27 mmol/L (ref 22–32)
Chloride: 103 mmol/L (ref 101–111)
Creatinine, Ser: 0.95 mg/dL (ref 0.61–1.24)
GFR calc Af Amer: >60 mL/min (ref >60)
GLUCOSE: 167 mg/dL — AB (ref 65–99)
Potassium: 4.2 mmol/L (ref 3.5–5.1)
SODIUM: 136 mmol/L (ref 135–145)

## 2015-11-13 LAB — CBC
HCT: 38.2 % — ABNORMAL LOW (ref 39.0–52.0)
Hemoglobin: 12.8 g/dL — ABNORMAL LOW (ref 13.0–17.0)
MCH: 30.8 pg (ref 26.0–34.0)
MCHC: 33.5 g/dL (ref 30.0–36.0)
MCV: 92 fL (ref 78.0–100.0)
PLATELETS: 190 10*3/uL (ref 150–400)
RBC: 4.15 MIL/uL — ABNORMAL LOW (ref 4.22–5.81)
RDW: 12.8 % (ref 11.5–15.5)
WBC: 11.5 10*3/uL — ABNORMAL HIGH (ref 4.0–10.5)

## 2015-11-13 LAB — MAGNESIUM: MAGNESIUM: 1.6 mg/dL — AB (ref 1.7–2.4)

## 2015-11-13 MED ORDER — MAGNESIUM SULFATE 2 GM/50ML IV SOLN
2.0000 g | Freq: Once | INTRAVENOUS | Status: AC
  Administered 2015-11-13: 2 g via INTRAVENOUS
  Filled 2015-11-13: qty 50

## 2015-11-13 MED ORDER — HYDROMORPHONE HCL 1 MG/ML IJ SOLN
0.5000 mg | INTRAMUSCULAR | Status: DC | PRN
  Administered 2015-11-13 – 2015-11-17 (×18): 1 mg via INTRAVENOUS
  Filled 2015-11-13 (×18): qty 1

## 2015-11-13 MED ORDER — DEXTROSE 5 % IV SOLN
1000.0000 mg | Freq: Four times a day (QID) | INTRAVENOUS | Status: DC | PRN
  Administered 2015-11-13 – 2015-11-14 (×3): 1000 mg via INTRAVENOUS
  Filled 2015-11-13 (×8): qty 10

## 2015-11-13 MED ORDER — OXYCODONE HCL 5 MG PO TABS
5.0000 mg | ORAL_TABLET | ORAL | Status: DC | PRN
  Administered 2015-11-14: 5 mg via ORAL
  Administered 2015-11-15 – 2015-11-18 (×10): 10 mg via ORAL
  Filled 2015-11-13 (×10): qty 2
  Filled 2015-11-13: qty 1
  Filled 2015-11-13: qty 2

## 2015-11-13 NOTE — Progress Notes (Signed)
Pt medicated with rectal tylenol for a fever of 100.4. Helped out of bed and assisted to the bathroom. Walked fairly well with assistance of 2. Has not voided since foley removal this morning. Pt now sitting up in chair

## 2015-11-13 NOTE — Progress Notes (Signed)
   11/13/15 1806  Urethral Catheter Ben Mikale Silversmith Latex 16 Fr.  Placement Date/Time: 11/13/15 1805   Person Inserting Catheter: Jolayne Panther  Person Assisting with Catheter Insertion: Davon  Catheter Type: Latex  Tube Size (Fr.): 16 Fr.  Catheter Balloon Size: 10 mL  Urine Returned: Yes  Patient/Family Education Prov...  Indication for Insertion or Continuance of Catheter Acute urinary retention  Site Assessment Clean;Intact;Dry  Catheter Maintenance Drainage bag/tubing not touching floor;Bag below level of bladder;Catheter secured  Collection Container Standard drainage bag  Pt was unable to void 6hrs after foley discontinuation this morning. Bladder scanned with 385cc at 1545hrs. MD gave a verbal order to reinsert foley if pt unable to void by 1700hrs

## 2015-11-13 NOTE — Progress Notes (Signed)
[facility] Progress Note  1 Day Post-Op  Subjective: NAE. Family in room. Abdominal pain improved with IV dilaudid, currently rated a 7/10. Reports IV Morphine does not help pain. Does not like how sedating dilaudid is. Denies flatus or BM. Denies fever, chills, nausea, vomiting.   Low-grade temp 100.4, mild tachycardia 103  Discussed likelihood of post-op ileus. discussed importance of IS and mobilization. Discussed gradual transition of IV to PO pain control.   RLQ Drain: 140cc/24h serosanguinous  Objective: Vital signs in last 24 hours: Temp:  [97.7 F (36.5 C)-100.4 F (38 C)] 99.7 F (37.6 C) (10/11 0945) Pulse Rate:  [90-120] 103 (10/11 0945) Resp:  [13-22] 18 (10/11 0945) BP: (107-153)/(57-83) 125/62 (10/11 0945) SpO2:  [92 %-98 %] 93 % (10/11 0945) Weight:  [218 lb 4.1 oz (99 kg)] 218 lb 4.1 oz (99 kg) (10/10 2152) Last BM Date: 11/12/15  Intake/Output from previous day: 10/10 0701 - 10/11 0700 In: 4595 [I.V.:3445; IV Piggyback:1150] Out: 1220 [Urine:980; Drains:140; Blood:100] Intake/Output this shift: Total I/O In: -  Out: 200 [Urine:200]  PE: Gen:  Sleepy, pleasant and cooperative Pulm:  CTA, no W/R/R Abd: Soft, appropriately tender, ND, few BS, incisions C/D/I, drain with mostly sanguinous drainage, no purulence Ext:  No erythema, edema, or tenderness   Lab Results:   Recent Labs  11/12/15 1215 11/13/15 0349  WBC 12.9* 11.5*  HGB 14.6 12.8*  HCT 42.4 38.2*  PLT 229 190   BMET  Recent Labs  11/12/15 1215 11/13/15 0349  NA 138 136  K 4.6 4.2  CL 102 103  CO2 28 27  GLUCOSE 123* 167*  BUN 8 6  CREATININE 1.06 0.95  CALCIUM 9.4 8.1*   CMP     Component Value Date/Time   NA 136 11/13/2015 0349   K 4.2 11/13/2015 0349   CL 103 11/13/2015 0349   CO2 27 11/13/2015 0349   GLUCOSE 167 (H) 11/13/2015 0349   BUN 6 11/13/2015 0349   CREATININE 0.95 11/13/2015 0349   CALCIUM 8.1 (L) 11/13/2015 0349   PROT 7.1 11/12/2015 1215    ALBUMIN 3.8 11/12/2015 1215   AST 18 11/12/2015 1215   ALT 17 11/12/2015 1215   ALKPHOS 83 11/12/2015 1215   BILITOT 0.7 11/12/2015 1215   GFRNONAA >60 11/13/2015 0349   GFRAA >60 11/13/2015 0349   Lipase     Component Value Date/Time   LIPASE 19 11/12/2015 1215   Studies/Results: Ct Abdomen Pelvis W Contrast  Result Date: 11/12/2015 CLINICAL DATA:  Right lower quadrant abdominal pain for 3 days with fever. History of ventral abdominal hernia repair. EXAM: CT ABDOMEN AND PELVIS WITH CONTRAST TECHNIQUE: Multidetector CT imaging of the abdomen and pelvis was performed using the standard protocol following bolus administration of intravenous contrast. CONTRAST:  114mL ISOVUE-300 IOPAMIDOL (ISOVUE-300) INJECTION 61% COMPARISON:  None. FINDINGS: Lower chest: Calcified 10 mm left lower lobe granuloma. Hepatobiliary: Normal liver with no liver mass. Normal gallbladder with no radiopaque cholelithiasis. No biliary ductal dilatation. Pancreas: Normal, with no mass or duct dilation. Spleen: Normal size. No mass. Adrenals/Urinary Tract: Normal adrenals. Normal kidneys with no hydronephrosis and no renal mass. Normal bladder. Stomach/Bowel: Grossly normal stomach. Normal caliber small bowel. Appendix is prominently dilated (23 mm diameter). Diffuse appendiceal wall thickening. Prominent periappendiceal fat stranding. Findings are consistent with acute appendicitis. Mild reactive wall thickening in the terminal ileum and base of cecum. Vascular/Lymphatic: Atherosclerotic nonaneurysmal abdominal aorta. Patent portal, splenic, hepatic and renal veins. There is confluent soft tissue  density partial encasing the lower inferior vena cava and lower abdominal aorta near the bifurcation, measuring up to the 2.4 cm thickness (series 2/ image 54). Otherwise no pathologically enlarged abdominopelvic lymph nodes. Reproductive: Normal size prostate. Other: No pneumoperitoneum, ascites or focal fluid collection.  Musculoskeletal: No aggressive appearing focal osseous lesions. Mild thoracolumbar spondylosis. IMPRESSION: 1. Acute appendicitis.  No abscess or free intraperitoneal air. 2. Confluent soft tissue density partially encasing the lower IVC and abdominal aorta near the bifurcation, worrisome for lymphoma. PET-CT would be useful for further characterization, although should be delayed until the patient has recovered from the acute appendicitis. 3. Aortic atherosclerosis. These results were called by telephone at the time of interpretation on 11/12/2015 at 2:21 pm to Dr. Threasa Beards BELFI , who verbally acknowledged these results. Electronically Signed   By: Ilona Sorrel M.D.   On: 11/12/2015 14:23    Anti-infectives: Anti-infectives    Start     Dose/Rate Route Frequency Ordered Stop   11/12/15 2200  piperacillin-tazobactam (ZOSYN) IVPB 3.375 g     3.375 g 12.5 mL/hr over 240 Minutes Intravenous Every 8 hours 11/12/15 2145     11/12/15 1645  cefOXitin (MEFOXIN) 2 g in dextrose 5 % 50 mL IVPB     2 g 100 mL/hr over 30 Minutes Intravenous To Short Stay 11/12/15 1625 11/12/15 1657   11/12/15 1631  dextrose 5 % with cefOXitin (MEFOXIN) ADS Med    Comments:  Merryl Hacker   : cabinet override      11/12/15 1631 11/13/15 0444     Assessment/Plan Ruptured appendicitis POD#1 S/p laparoscopic appendectomy and placement of RLQ JP drain, 11/12/15 Dr. Greer Pickerel - WBC 11.5, repeat CBC in AM - RLQ drain output serosanguinous - 140 cc/24 h - encourage IS and mobilize as tolerated  - pain control: IV dilaudid, Will add IV robaxin and PO pain meds   FEN: Sips/chips  ID: cefoxitin 10/10 once, Zosyn 10/10 >>  DVT Proph: Lovenox, SCD's    Plan: continue IV abx, follow CBC, await bowel function     LOS: 0 days    Jill Alexanders , [facility] 11/13/2015, 12:01 PM Pager: [phone] Consults: [phone] Mon-Fri 7:00 am-4:30 pm Sat-Sun 7:00 am-11:30 am

## 2015-11-13 NOTE — Op Note (Signed)
NAMELatravion, Schmalzried Jennings                   ACCOUNT NO.:  [id number]  MEDICAL RECORD NO.:  [id number]  LOCATION:  [id number]                        FACILITY:  [facility] [facility]  PHYSICIAN:  Khyden Davin. Redmond Pulling, MD, FACSDATE OF BIRTH:  Jun 23, 1970  DATE OF PROCEDURE:  11/12/2015 DATE OF DISCHARGE:                              OPERATIVE REPORT   PREOPERATIVE DIAGNOSIS:  Acute appendicitis.  POSTOPERATIVE DIAGNOSIS:  Ruptured appendicitis.  PROCEDURE:  Laparoscopic appendectomy.  SURGEON:  Decameron Hood. Redmond Pulling, MD, FACS.  ASSISTANT SURGEON:  None.  ANESTHESIA:  General.  ESTIMATED BLOOD LOSS:  100 mL.  SPECIMEN:  Appendix in pieces.  INDICATIONS FOR PROCEDURE:  The patient is a pleasant 45 year old gentleman who has had abdominal pain on and off since this past Friday. He had intermittent pain with intermittent nausea.  He ended up seeing urgent care yesterday and was recommended to get a CT scan, but they had difficulty getting insurance approval, so his doctor told him just to come to the emergency room for ongoing pain and he also started having subjective fever and chills last night.  A CT scan had evidence of acute appendicitis with periappendiceal stranding.  He was also found to have a soft tissue density around his aorta near the bifurcation around the IVC as well.  There was no other evidence of abdominal lymphadenopathy. I recommended addressing the more pressing infectious process at hand today, the acute appendicitis.  We discussed at length the risks and benefits of the procedure.  Please see my preoperative note for that discussion.  DESCRIPTION OF PROCEDURE:  After obtaining informed consent, the patient was taken to OR #1 at [facility], placed supine on the operating table.  General endotracheal anesthesia was established. Sequential compression devices were placed.  A Foley catheter was placed.  His left arm was tucked at the side with appropriate padding. A surgical time-out was  performed.  He received cefoxitin prior to skin incision.  Local was infiltrated in the infraumbilical position followed by incision with a #11 blade.  A surgical time-out had been performed prior to skin incision.  The fascia was grasped and lifted anteriorly. The fascia was incised and the abdominal cavity was entered.  A pursestring suture was placed around the fascial edges with 0 Vicryl.  A 12 mm Hasson trocar was placed, and abdominal cavity was entered. Pneumoperitoneum was smoothly established with no change in vital signs. The abdominal cavity was surveilled with the laparoscope.  There was no evidence of injury to surrounding structures.  A 5 mm trocar was placed in the suprapubic location and a 5-mm trocar was placed in the left lower quadrant all under direct visualization.  The patient was rotated to the left and placed in Trendelenburg.  The small bowel was reflected exposing very inflamed and densely adherent appendix to the right lateral abdominal wall, near the pelvic inlet.  I grasped the tip of it and the mid body of the appendix ruptured with stool spillage.  This was quickly evacuated.  He also had evidence of perforation that appeared to be near the base of the appendix.  This commenced several hour long mobilization of the mesoappendix and appendix.  I ended up upsizing the left lower quadrant trocar to a 12-mm trocar eventually.  The appendix ended up ripping in several pieces.  Some of the pieces came apart.  I placed him in an EndoCatch bag along with the fecaliths, which were numerous into the EndoCatch bag and removed it.  This was done twice.  I ended up mobilizing the terminal ilium off the sacral promontory in order to get a better visualization of the mesoappendix and attempt to find the base of the appendix to see if it would accommodate a stapler since there was a hole near the base of the appendix, at the junction of the cecum, and I wanted to make sure  that the terminal ilium would not be the staple line.  I came across the mesoappendix, just near the junction with the appendix with the Harmonic Scalpel.  There were some oozing intermittently from this.  I then took down some of the lateral attachments of the right colon and cecum.  I was able to get an atraumatic bowel grasper behind the base of the appendix, behind the tip of the cecum.  It looked like I could get a stapler across this.  I obtained an Ethicon Echelon laparoscopic stapler, 60 mm stapler with a green load and fired it.  The staple line looked good.  However, I did another load to completely transect it.  There was ample room and the compromise of the terminal ileum where it entered the cecum.  I then removed the remaining appendix distally, essentially just ripped in pieces from the remaining mesoappendix.  There was some bleeding from the remaining mesoappendix.  Hemostasis was achieved with a combination of clips, Harmonic Scalpel, and a PDS Endoloop.  I then irrigated the abdomen copiously with 4 L of saline.  There appeared to be a good hemostasis.  I reinspected the cecum and the staple line.  It was hemostatic.  There was no evidence of serosal tear.  There was a serosal tear to the cecum.  The staple lines appeared hemostatic and completely intact.  Due to the contamination of stool with fecaliths in the abdomen earlier, I did elect to leave a drain.  A 5-mm trocar was placed in the right mid abdomen to accommodate placement of a drain.  A 19-round Blake drain was introduced through the Hasson trocar.  The end of the drain was then brought out through the previously placed right upper quadrant trocar site.  It was secured to the skin with a 2-0 nylon suture.  The drain was positioned in the right pericolic gutter extending into the pelvis. At this point, the Hasson trocar was removed.  The pursestring suture that had been previously placed fell out.  So, I ended  up placing 2 interrupted 0 Vicryl sutures using an EndoClose suture device.  I then closed the 12-mm trocar site in the left lower quadrant under direct visualization using laparoscopic guidance with the PMI suture passer. There was no palpable defect.  Pneumoperitoneum was released.  The remaining 5-mm trocar in the suprapubic location was removed.  The skin incision at the umbilicus was irrigated with saline.  I then closed the 3 trocar sites with a 4-0 Monocryl in a subcuticular fashion followed by application of benzoin, Steri-Strips, and sterile bandages.  The patient was extubated, taken to recovery room in stable condition.  All needle and instrument counts were correct x2.     [name], MD, FACS     EMW/MEDQ  D:  11/12/2015  T:  11/13/2015  Job:  [id number]

## 2015-11-14 ENCOUNTER — Other Ambulatory Visit: Payer: Self-pay

## 2015-11-14 LAB — CBC WITH DIFFERENTIAL/PLATELET
Basophils Absolute: 0 10*3/uL (ref 0.0–0.1)
Basophils Relative: 0 %
EOS PCT: 1 %
Eosinophils Absolute: 0.1 10*3/uL (ref 0.0–0.7)
HCT: 42 % (ref 39.0–52.0)
HEMOGLOBIN: 14.3 g/dL (ref 13.0–17.0)
LYMPHS ABS: 1.3 10*3/uL (ref 0.7–4.0)
LYMPHS PCT: 9 %
MCH: 31.3 pg (ref 26.0–34.0)
MCHC: 34 g/dL (ref 30.0–36.0)
MCV: 91.9 fL (ref 78.0–100.0)
Monocytes Absolute: 1.2 10*3/uL — ABNORMAL HIGH (ref 0.1–1.0)
Monocytes Relative: 8 %
NEUTROS PCT: 82 %
Neutro Abs: 12 10*3/uL — ABNORMAL HIGH (ref 1.7–7.7)
Platelets: 203 10*3/uL (ref 150–400)
RBC: 4.57 MIL/uL (ref 4.22–5.81)
RDW: 13 % (ref 11.5–15.5)
WBC: 14.5 10*3/uL — AB (ref 4.0–10.5)

## 2015-11-14 LAB — BASIC METABOLIC PANEL
Anion gap: 8 (ref 5–15)
BUN: 7 mg/dL (ref 6–20)
CHLORIDE: 100 mmol/L — AB (ref 101–111)
CO2: 26 mmol/L (ref 22–32)
Calcium: 8.6 mg/dL — ABNORMAL LOW (ref 8.9–10.3)
Creatinine, Ser: 1.05 mg/dL (ref 0.61–1.24)
GFR calc Af Amer: >60 mL/min (ref >60)
GFR calc non Af Amer: >60 mL/min (ref >60)
GLUCOSE: 195 mg/dL — AB (ref 65–99)
POTASSIUM: 4.9 mmol/L (ref 3.5–5.1)
Sodium: 134 mmol/L — ABNORMAL LOW (ref 135–145)

## 2015-11-14 LAB — MAGNESIUM: Magnesium: 2.1 mg/dL (ref 1.7–2.4)

## 2015-11-14 LAB — GLUCOSE, CAPILLARY
GLUCOSE-CAPILLARY: 174 mg/dL — AB (ref 65–99)
GLUCOSE-CAPILLARY: 160 mg/dL — AB (ref 65–99)
Glucose-Capillary: 143 mg/dL — ABNORMAL HIGH (ref 65–99)
Glucose-Capillary: 168 mg/dL — ABNORMAL HIGH (ref 65–99)
Glucose-Capillary: 171 mg/dL — ABNORMAL HIGH (ref 65–99)
Glucose-Capillary: 196 mg/dL — ABNORMAL HIGH (ref 65–99)

## 2015-11-14 NOTE — Progress Notes (Signed)
2 Days Post-Op  Subjective: Complains of burning with foley. No flatus yet  Objective: Vital signs in last 24 hours: Temp:  [97.9 F (36.6 C)-101.7 F (38.7 C)] 97.9 F (36.6 C) (10/12 0537) Pulse Rate:  [101-112] 112 (10/12 0537) Resp:  [17-18] 17 (10/12 0537) BP: (121-144)/(68-79) 121/74 (10/12 0537) SpO2:  [90 %-94 %] 92 % (10/12 0537) Last BM Date: 11/12/15  Intake/Output from previous day: 10/11 0701 - 10/12 0700 In: 750 [I.V.:750] Out: 1450 [Urine:1125; Drains:325] Intake/Output this shift: No intake/output data recorded.  Resp: clear to auscultation bilaterally Cardio: regular rate and rhythm GI: soft, mild tenderness. incisions look good. few bs  Lab Results:   Recent Labs  11/13/15 0349 11/14/15 0503  WBC 11.5* 14.5*  HGB 12.8* 14.3  HCT 38.2* 42.0  PLT 190 203   BMET  Recent Labs  11/13/15 0349 11/14/15 0503  NA 136 134*  K 4.2 4.9  CL 103 100*  CO2 27 26  GLUCOSE 167* 195*  BUN 6 7  CREATININE 0.95 1.05  CALCIUM 8.1* 8.6*   PT/INR No results for input(s): LABPROT, INR in the last 72 hours. ABG No results for input(s): PHART, HCO3 in the last 72 hours.  Invalid input(s): PCO2, PO2  Studies/Results: Ct Abdomen Pelvis W Contrast  Result Date: 11/12/2015 CLINICAL DATA:  Right lower quadrant abdominal pain for 3 days with fever. History of ventral abdominal hernia repair. EXAM: CT ABDOMEN AND PELVIS WITH CONTRAST TECHNIQUE: Multidetector CT imaging of the abdomen and pelvis was performed using the standard protocol following bolus administration of intravenous contrast. CONTRAST:  157mL ISOVUE-300 IOPAMIDOL (ISOVUE-300) INJECTION 61% COMPARISON:  None. FINDINGS: Lower chest: Calcified 10 mm left lower lobe granuloma. Hepatobiliary: Normal liver with no liver mass. Normal gallbladder with no radiopaque cholelithiasis. No biliary ductal dilatation. Pancreas: Normal, with no mass or duct dilation. Spleen: Normal size. No mass. Adrenals/Urinary Tract:  Normal adrenals. Normal kidneys with no hydronephrosis and no renal mass. Normal bladder. Stomach/Bowel: Grossly normal stomach. Normal caliber small bowel. Appendix is prominently dilated (23 mm diameter). Diffuse appendiceal wall thickening. Prominent periappendiceal fat stranding. Findings are consistent with acute appendicitis. Mild reactive wall thickening in the terminal ileum and base of cecum. Vascular/Lymphatic: Atherosclerotic nonaneurysmal abdominal aorta. Patent portal, splenic, hepatic and renal veins. There is confluent soft tissue density partial encasing the lower inferior vena cava and lower abdominal aorta near the bifurcation, measuring up to the 2.4 cm thickness (series 2/ image 54). Otherwise no pathologically enlarged abdominopelvic lymph nodes. Reproductive: Normal size prostate. Other: No pneumoperitoneum, ascites or focal fluid collection. Musculoskeletal: No aggressive appearing focal osseous lesions. Mild thoracolumbar spondylosis. IMPRESSION: 1. Acute appendicitis.  No abscess or free intraperitoneal air. 2. Confluent soft tissue density partially encasing the lower IVC and abdominal aorta near the bifurcation, worrisome for lymphoma. PET-CT would be useful for further characterization, although should be delayed until the patient has recovered from the acute appendicitis. 3. Aortic atherosclerosis. These results were called by telephone at the time of interpretation on 11/12/2015 at 2:21 pm to Dr. Threasa Beards BELFI , who verbally acknowledged these results. Electronically Signed   By: Ilona Sorrel M.D.   On: 11/12/2015 14:23    Anti-infectives: Anti-infectives    Start     Dose/Rate Route Frequency Ordered Stop   11/12/15 2200  piperacillin-tazobactam (ZOSYN) IVPB 3.375 g     3.375 g 12.5 mL/hr over 240 Minutes Intravenous Every 8 hours 11/12/15 2145     11/12/15 1645  cefOXitin (MEFOXIN) 2 g  in dextrose 5 % 50 mL IVPB     2 g 100 mL/hr over 30 Minutes Intravenous To Short Stay  11/12/15 1625 11/12/15 1657   11/12/15 1631  dextrose 5 % with cefOXitin (MEFOXIN) ADS Med    Comments:  Merryl Hacker   : cabinet override      11/12/15 1631 11/13/15 0444      Assessment/Plan: s/p Procedure(s): APPENDECTOMY LAPAROSCOPIC (N/A) Ileus. advance diet when he seems to be able to tolerate  Urinary retention. Plan to try to remove foley again tomorrow am Zosyn for appendicitis ambulate  LOS: 1 day    TOTH III,PAUL S 11/14/2015

## 2015-11-14 NOTE — Progress Notes (Signed)
Patient seen and examined this afternoon. Resting comfortably. He had ambulated. No flatus. Abdomen still sore. Wife has questions  Asleep, easily arousable, nontoxic Clear Morbidly obese, soft, mild appropriate tenderness, drain serous  Urinary retention, continue Foley catheter Continue IV antibiotics due to ruptured appendicitis with contamination Await return of bowel function Ambulate, IS Chemical DVT prophylaxis Answered wife's questions. Reviewed intra-abdominal findings again with the wife & showed diagram of anatomy  James Jennings. Redmond Pulling, MD, FACS General, Bariatric, & Minimally Invasive Surgery [facility], [place]

## 2015-11-14 NOTE — Anesthesia Postprocedure Evaluation (Signed)
Anesthesia Post Note  Patient: James Jennings  Procedure(s) Performed: Procedure(s) (LRB): APPENDECTOMY LAPAROSCOPIC (N/A)  Patient location during evaluation: PACU Anesthesia Type: General Level of consciousness: awake and alert and patient cooperative Pain management: pain level controlled Vital Signs Assessment: post-procedure vital signs reviewed and stable Respiratory status: spontaneous breathing and respiratory function stable Cardiovascular status: stable Anesthetic complications: no    Last Vitals:  Vitals:   11/14/15 0307 11/14/15 0537  BP: 131/79 121/74  Pulse: (!) 112 (!) 112  Resp: 18 17  Temp: 37.4 C 36.6 C    Last Pain:  Vitals:   11/14/15 0612  TempSrc:   PainSc: Golden

## 2015-11-14 NOTE — Progress Notes (Signed)
Pt ambulated about 100 meters on unit with standby assistance this pm. Also was able to tolerate po pain medicine this evening. Pt more alert and interactive

## 2015-11-15 LAB — GLUCOSE, CAPILLARY
GLUCOSE-CAPILLARY: 136 mg/dL — AB (ref 65–99)
GLUCOSE-CAPILLARY: 151 mg/dL — AB (ref 65–99)
Glucose-Capillary: 156 mg/dL — ABNORMAL HIGH (ref 65–99)
Glucose-Capillary: 123 mg/dL — ABNORMAL HIGH (ref 65–99)
Glucose-Capillary: 120 mg/dL — ABNORMAL HIGH (ref 65–99)
Glucose-Capillary: 154 mg/dL — ABNORMAL HIGH (ref 65–99)

## 2015-11-15 LAB — CBC
HCT: 36.7 % — ABNORMAL LOW (ref 39.0–52.0)
Hemoglobin: 12.6 g/dL — ABNORMAL LOW (ref 13.0–17.0)
MCH: 31.3 pg (ref 26.0–34.0)
MCHC: 34.3 g/dL (ref 30.0–36.0)
MCV: 91.3 fL (ref 78.0–100.0)
PLATELETS: 187 10*3/uL (ref 150–400)
RBC: 4.02 MIL/uL — ABNORMAL LOW (ref 4.22–5.81)
RDW: 12.7 % (ref 11.5–15.5)
WBC: 10.6 10*3/uL — AB (ref 4.0–10.5)

## 2015-11-15 NOTE — Progress Notes (Signed)
3 Days Post-Op  Subjective: Patient resting comfortably in bed and is currently without complaints. States he is so thankful that Foley cath was removed early this morning and has already been up to restroom to urinate twice since catheter was removed. No flatus or BM yet. No nausea/vomiting. Pain manageable. Mother at bedside during exam and states she is quite concerned that patient's WBC count was rising yesterday and requests labs be re-checked today.   Objective: Vital signs in last 24 hours: Temp:  [98.7 F (37.1 C)-98.8 F (37.1 C)] 98.8 F (37.1 C) (10/13 0442) Pulse Rate:  [91-101] 91 (10/13 0442) Resp:  [16-18] 18 (10/13 0442) BP: (112-136)/(68-81) 112/81 (10/13 0442) SpO2:  [93 %-96 %] 94 % (10/13 0442) Last BM Date: 11/12/15  Intake/Output from previous day: 10/12 0701 - 10/13 0700 In: 3140 [P.O.:480; I.V.:2210; IV Piggyback:450] Out: [id number] [Urine:1120; Drains:565] Intake/Output this shift: Total I/O In: -  Out: 200 [Urine:200]  PE: Gen: WD obese male resting comfortably in bed CV: RRR PULM: CTA B/L ABD: soft and distended with expected incisional tenderness. Incisions C/D/I. Single JP drain with serous output (565 cc/24 hrs). Abdomen is quiet.    Lab Results:   Recent Labs  11/13/15 0349 11/14/15 0503  WBC 11.5* 14.5*  HGB 12.8* 14.3  HCT 38.2* 42.0  PLT 190 203   BMET  Recent Labs  11/13/15 0349 11/14/15 0503  NA 136 134*  K 4.2 4.9  CL 103 100*  CO2 27 26  GLUCOSE 167* 195*  BUN 6 7  CREATININE 0.95 1.05  CALCIUM 8.1* 8.6*   PT/INR No results for input(s): LABPROT, INR in the last 72 hours. CMP     Component Value Date/Time   NA 134 (L) 11/14/2015 0503   K 4.9 11/14/2015 0503   CL 100 (L) 11/14/2015 0503   CO2 26 11/14/2015 0503   GLUCOSE 195 (H) 11/14/2015 0503   BUN 7 11/14/2015 0503   CREATININE 1.05 11/14/2015 0503   CALCIUM 8.6 (L) 11/14/2015 0503   PROT 7.1 11/12/2015 1215   ALBUMIN 3.8 11/12/2015 1215   AST 18 11/12/2015  1215   ALT 17 11/12/2015 1215   ALKPHOS 83 11/12/2015 1215   BILITOT 0.7 11/12/2015 1215   GFRNONAA >60 11/14/2015 0503   GFRAA >60 11/14/2015 0503   Lipase     Component Value Date/Time   LIPASE 19 11/12/2015 1215       Studies/Results: No results found.  Anti-infectives: Anti-infectives    Start     Dose/Rate Route Frequency Ordered Stop   11/12/15 2200  piperacillin-tazobactam (ZOSYN) IVPB 3.375 g     3.375 g 12.5 mL/hr over 240 Minutes Intravenous Every 8 hours 11/12/15 2145     11/12/15 1645  cefOXitin (MEFOXIN) 2 g in dextrose 5 % 50 mL IVPB     2 g 100 mL/hr over 30 Minutes Intravenous To Short Stay 11/12/15 1625 11/12/15 1657   11/12/15 1631  dextrose 5 % with cefOXitin (MEFOXIN) ADS Med    Comments:  Merryl Hacker   : cabinet override      11/12/15 1631 11/13/15 0444       Assessment/Plan Urinary retention resolved. Continue IV antibiotics due to ruptured appendicitis with contamination. Await return of bowel function. Increase ambulation and time out of bed today. IS while in bed or chair. Chemical DVT prophylaxis On clears  LOS: 2 days    LEE Kahley Leib, [facility] 11/15/2015, 8:35 AM

## 2015-11-15 NOTE — Progress Notes (Signed)
Pt was complaining of pain and he wants to discontinue the foley cath, bladder scan shows 0 ml urine, no urinary retention, informed Dr. Georganna Skeans and ordered discontinue foley cath.

## 2015-11-16 LAB — GLUCOSE, CAPILLARY
GLUCOSE-CAPILLARY: 132 mg/dL — AB (ref 65–99)
GLUCOSE-CAPILLARY: 105 mg/dL — AB (ref 65–99)
Glucose-Capillary: 107 mg/dL — ABNORMAL HIGH (ref 65–99)

## 2015-11-16 NOTE — Progress Notes (Signed)
4 Days Post-Op  Subjective: No n/v. Tolerated clears. No flatus. No bm.  Ambulated  Objective: Vital signs in last 24 hours: Temp:  [98 F (36.7 C)-99.8 F (37.7 C)] 98 F (36.7 C) (10/14 0414) Pulse Rate:  [79-99] 87 (10/14 0414) Resp:  [18] 18 (10/14 0414) BP: (110-118)/(65-75) 112/65 (10/14 0414) SpO2:  [96 %-98 %] 96 % (10/14 0414) Last BM Date: 11/12/15  Intake/Output from previous day: 10/13 0701 - 10/14 0700 In: 2649 [P.O.:240; I.V.:2309; IV Piggyback:100] Out: 2080 [Urine:1850; Drains:230] Intake/Output this shift: No intake/output data recorded.  Alert, nad, not ill appearing cta Reg Soft, obese, min no TTP; drain serous  Lab Results:   Recent Labs  11/14/15 0503 11/15/15 0940  WBC 14.5* 10.6*  HGB 14.3 12.6*  HCT 42.0 36.7*  PLT 203 187   BMET  Recent Labs  11/14/15 0503  NA 134*  K 4.9  CL 100*  CO2 26  GLUCOSE 195*  BUN 7  CREATININE 1.05  CALCIUM 8.6*   PT/INR No results for input(s): LABPROT, INR in the last 72 hours. ABG No results for input(s): PHART, HCO3 in the last 72 hours.  Invalid input(s): PCO2, PO2  Studies/Results: No results found.  Anti-infectives: Anti-infectives    Start     Dose/Rate Route Frequency Ordered Stop   11/12/15 2200  piperacillin-tazobactam (ZOSYN) IVPB 3.375 g     3.375 g 12.5 mL/hr over 240 Minutes Intravenous Every 8 hours 11/12/15 2145     11/12/15 1645  cefOXitin (MEFOXIN) 2 g in dextrose 5 % 50 mL IVPB     2 g 100 mL/hr over 30 Minutes Intravenous To Short Stay 11/12/15 1625 11/12/15 1657   11/12/15 1631  dextrose 5 % with cefOXitin (MEFOXIN) ADS Med    Comments:  Merryl Hacker   : cabinet override      11/12/15 1631 11/13/15 0444      Assessment/Plan: s/p Procedure(s): APPENDECTOMY LAPAROSCOPIC (N/A) Urinary retention resolved. Continue IV antibiotics due to ruptured appendicitis with contamination. Total of 7 days Await return of bowel function. Increase ambulation and time out of  bed today. IS while in bed or chair. Chemical DVT prophylaxis Adv to fulls and hold diet until flatus Decrease IVF Stop blood sugar checks.  Discussed criteria for dc Explained today is last day on service for me.   [name], MD, FACS General, Bariatric, & Minimally Invasive Surgery [facility], [place]   LOS: 3 days    James Jennings, James Jennings 11/16/2015

## 2015-11-17 MED ORDER — FAMOTIDINE 20 MG PO TABS
20.0000 mg | ORAL_TABLET | Freq: Two times a day (BID) | ORAL | Status: DC
  Administered 2015-11-17: 20 mg via ORAL
  Filled 2015-11-17: qty 1

## 2015-11-17 NOTE — Progress Notes (Signed)
5 Days Post-Op  Subjective: Pt doing well + flatus tol FLD, with some cramping about 30 min after  Objective: Vital signs in last 24 hours: Temp:  [98.6 F (37 C)-98.9 F (37.2 C)] 98.6 F (37 C) (10/15 0536) Pulse Rate:  [82-94] 82 (10/15 0536) Resp:  [16-18] 16 (10/15 0536) BP: (117-127)/(65-77) 117/77 (10/15 0536) SpO2:  [96 %-100 %] 96 % (10/15 0536) Last BM Date: 11/12/15  Intake/Output from previous day: 10/14 0701 - 10/15 0700 In: 2349.2 [P.O.:360; I.V.:1559.2; IV Piggyback:430] Out: 270 [Drains:270] Intake/Output this shift: Total I/O In: 120 [P.O.:120] Out: -   General appearance: alert and cooperative GI: soft, non-tender; bowel sounds normal; no masses,  no organomegaly and JP serous  Lab Results:   Recent Labs  11/15/15 0940  WBC 10.6*  HGB 12.6*  HCT 36.7*  PLT 187   BMET No results for input(s): NA, K, CL, CO2, GLUCOSE, BUN, CREATININE, CALCIUM in the last 72 hours. PT/INR No results for input(s): LABPROT, INR in the last 72 hours. ABG No results for input(s): PHART, HCO3 in the last 72 hours.  Invalid input(s): PCO2, PO2  Studies/Results: No results found.  Anti-infectives: Anti-infectives    Start     Dose/Rate Route Frequency Ordered Stop   11/12/15 2200  piperacillin-tazobactam (ZOSYN) IVPB 3.375 g     3.375 g 12.5 mL/hr over 240 Minutes Intravenous Every 8 hours 11/12/15 2145     11/12/15 1645  cefOXitin (MEFOXIN) 2 g in dextrose 5 % 50 mL IVPB     2 g 100 mL/hr over 30 Minutes Intravenous To Short Stay 11/12/15 1625 11/12/15 1657   11/12/15 1631  dextrose 5 % with cefOXitin (MEFOXIN) ADS Med    Comments:  Merryl Hacker   : cabinet override      11/12/15 1631 11/13/15 0444      Assessment/Plan: s/p Procedure(s): APPENDECTOMY LAPAROSCOPIC (N/A) Advance diet as tol Await bowel function mobilize  LOS: 4 days    Rosario Jacks., [facility] 11/17/2015

## 2015-11-18 MED ORDER — OXYCODONE HCL 5 MG PO TABS: 5.0000 mg | ORAL_TABLET | ORAL | 0 refills | Status: DC | PRN

## 2015-11-18 MED ORDER — AMOXICILLIN-POT CLAVULANATE 875-125 MG PO TABS: 1.0000 | ORAL_TABLET | Freq: Two times a day (BID) | ORAL | 0 refills | Status: AC

## 2015-11-18 NOTE — Progress Notes (Addendum)
D/C papers gone over with pt. Prescriptions given to pt. IV taken out. No questions/complaints. Dressing changed prior to d/c and dressing materials (i.e. Gauze, tape) given to pt. To take home. JP teaching done. Output record sheet and measuring cup given to pt. And his wife. Pt. D/c'd successfully via w/c.

## 2015-11-18 NOTE — Discharge Instructions (Signed)
Bulb Drain Home Care A bulb drain consists of a thin rubber tube and a soft, round bulb that creates a gentle suction. The rubber tube is placed in the area where you had surgery. A bulb is attached to the end of the tube that is outside the body. The bulb drain removes excess fluid that normally builds up in a surgical wound after surgery. The color and amount of fluid will vary. Immediately after surgery, the fluid is bright red and is a little thicker than water. It may gradually change to a yellow or pink color and become more thin and water-like. When the amount decreases to about 1 or 2 tbsp in 24 hours, your health care provider will usually remove it. DAILY CARE  Keep the bulb flat (compressed) at all times, except while emptying it. The flatness creates suction. You can flatten the bulb by squeezing it firmly in the middle and then closing the cap.  Keep sites where the tube enters the skin dry and covered with a bandage (dressing).  Secure the tube 1-2 in (2.5-5.1 cm) below the insertion sites to keep it from pulling on your stitches. The tube is stitched in place and will not slip out.  Secure the bulb as directed by your health care provider.  For the first 3 days after surgery, there usually is more fluid in the bulb. Empty the bulb whenever it becomes half full because the bulb does not create enough suction if it is too full. The bulb could also overflow. Write down how much fluid you remove each time you empty your drain. Add up the amount removed in 24 hours.  Empty the bulb at the same time every day once the amount of fluid decreases and you only need to empty it once a day. Write down the amounts and the 24-hour totals to give to your health care provider. This helps your health care provider know when the tubes can be removed. EMPTYING THE BULB DRAIN Before emptying the bulb, get a measuring cup, a piece of paper and a pen, and wash your hands.  Gently run your fingers down the  tube (stripping) to empty any drainage from the tubing into the bulb. This may need to be done several times a day to clear the tubing of clots and tissue.  Open the bulb cap to release suction, which causes it to inflate. Do not touch the inside of the cap.  Gently run your fingers down the tube (stripping) to empty any drainage from the tubing into the bulb.  Hold the cap out of the way, and pour fluid into the measuring cup.   Squeeze the bulb to provide suction.  Replace the cap.   Check the tape that holds the tube to your skin. If it is becoming loose, you can remove the loose piece of tape and apply a new one. Then, pin the bulb to your shirt.   Write down the amount of fluid you emptied out. Write down the date and each time you emptied your bulb drain. (If there are 2 bulbs, note the amount of drainage from each bulb and keep the totals separate. Your health care provider will want to know the total amounts for each drain and which tube is draining more.)   Flush the fluid down the toilet and wash your hands.   Call your health care provider once you have less than 2 tbsp of fluid collecting in the bulb drain every 24 hours. If  there is drainage around the tube site, change dressings and keep the area dry. Cleanse around tube with sterile saline and place dry gauze around site. This gauze should be changed when it is soiled. If it stays clean and unsoiled, it should still be changed daily.  SEEK MEDICAL CARE IF:  Your drainage has a bad smell or is cloudy.   You have a fever.   Your drainage is increasing instead of decreasing.   Your tube fell out.   You have redness or swelling around the tube site.   You have drainage from a surgical wound.   Your bulb drain will not stay flat after you empty it.  MAKE SURE YOU:   Understand these instructions.  Will watch your condition.  Will get help right away if you are not doing well or get worse.   This  information is not intended to replace advice given to you by your health care provider. Make sure you discuss any questions you have with your health care provider.   Document Released: 01/17/2000 Document Revised: 02/09/2014 Document Reviewed: 08/08/2014 Elsevier Interactive Patient Education [employer].

## 2015-11-18 NOTE — Progress Notes (Signed)
Patient ID: James Jennings, male   DOB: Oct 20, 1970, 44 y.o.   MRN: [id number]  [facility] Surgery Progress Note  6 Days Post-Op  Subjective: No complaints. Tolerating small amounts of diet. Denies n/v. Has been ambulating. BM this AM.   Objective: Vital signs in last 24 hours: Temp:  [98.3 F (36.8 C)-98.8 F (37.1 C)] 98.3 F (36.8 C) (10/16 0556) Pulse Rate:  [76-98] 98 (10/16 0556) Resp:  [16] 16 (10/16 0556) BP: (113-117)/(62-83) 113/83 (10/16 0556) SpO2:  [90 %-96 %] 90 % (10/16 0556) Last BM Date: 11/17/15  Intake/Output from previous day: 10/15 0701 - 10/16 0700 In: 1420 [P.O.:360; I.V.:910; IV Piggyback:150] Out: 1465 [Urine:1200; Drains:265] Intake/Output this shift: No intake/output data recorded.  PE: Gen:  Alert, NAD, pleasant Abd: Soft, obese, mildly distended, +BS, incisions C/D/I, drain site cdi with minimal serous drainage  Right abdominal drain 265cc/24hr  Lab Results:  No results for input(s): WBC, HGB, HCT, PLT in the last 72 hours. BMET No results for input(s): NA, K, CL, CO2, GLUCOSE, BUN, CREATININE, CALCIUM in the last 72 hours. PT/INR No results for input(s): LABPROT, INR in the last 72 hours. CMP     Component Value Date/Time   NA 134 (L) 11/14/2015 0503   K 4.9 11/14/2015 0503   CL 100 (L) 11/14/2015 0503   CO2 26 11/14/2015 0503   GLUCOSE 195 (H) 11/14/2015 0503   BUN 7 11/14/2015 0503   CREATININE 1.05 11/14/2015 0503   CALCIUM 8.6 (L) 11/14/2015 0503   PROT 7.1 11/12/2015 1215   ALBUMIN 3.8 11/12/2015 1215   AST 18 11/12/2015 1215   ALT 17 11/12/2015 1215   ALKPHOS 83 11/12/2015 1215   BILITOT 0.7 11/12/2015 1215   GFRNONAA >60 11/14/2015 0503   GFRAA >60 11/14/2015 0503   Lipase     Component Value Date/Time   LIPASE 19 11/12/2015 1215       Studies/Results: No results found.  Anti-infectives: Anti-infectives    Start     Dose/Rate Route Frequency Ordered Stop   11/12/15 2200  piperacillin-tazobactam (ZOSYN) IVPB  3.375 g     3.375 g 12.5 mL/hr over 240 Minutes Intravenous Every 8 hours 11/12/15 2145     11/12/15 1645  cefOXitin (MEFOXIN) 2 g in dextrose 5 % 50 mL IVPB     2 g 100 mL/hr over 30 Minutes Intravenous To Short Stay 11/12/15 1625 11/12/15 1657   11/12/15 1631  dextrose 5 % with cefOXitin (MEFOXIN) ADS Med    Comments:  Merryl Hacker   : cabinet override      11/12/15 1631 11/13/15 0444       Assessment/Plan s/p Procedure(s): APPENDECTOMY LAPAROSCOPIC (N/A) 11/13/15 Dr. Redmond Pulling - POD 5 - Right abdominal drain 265cc/24hr - afebrile, pain well controlled - had BM this morning  ID - zosyn 10/10>> FEN - soft, advance as tolerated VTE - lovenox  Plan - ready for discharge. Will send patient home on 1 week of augmentin, leave drain in place (patient comfortable emptying drain on his own, does not need HH), and follow-up with Dr. Redmond Pulling next week.   LOS: 5 days    Jerrye Beavers , [facility] Surgery 11/18/2015, 9:50 AM Pager: [phone] Consults: [phone] Mon-Fri 7:00 am-4:30 pm Sat-Sun 7:00 am-11:30 am  Agree with above.  Alphonsa Overall, MD, [facility] Surgery Pager: [phone] Office phone:  [phone]

## 2015-11-20 NOTE — Discharge Summary (Signed)
[facility] Discharge Summary   Patient ID: James Jennings MRN: [id number] DOB/AGE: 47-Mar-1972 47 y.o.  Admit date: 11/12/2015 Discharge date: 11/20/2015  Admitting Diagnosis: Acute appendicitis  Discharge Diagnosis Patient Active Problem List   Diagnosis Date Noted  . Acute appendicitis with rupture 11/13/2015  . Acute appendicitis 11/12/2015  . ACHILLES BURSITIS OR TENDINITIS 10/28/2009    Consultants None  Imaging: CT abdomen pelvis w contrast 11/12/15: 1. Acute appendicitis.  No abscess or free intraperitoneal air. 2. Confluent soft tissue density partially encasing the lower IVC and abdominal aorta near the bifurcation, worrisome for lymphoma. PET-CT would be useful for further characterization, although should be delayed until the patient has recovered from the acute appendicitis. 3. Aortic atherosclerosis.  Procedures Dr. Redmond Pulling (11/12/15) - Laparoscopic Appendectomy  Hospital Course:  James Jennings is a 45yo male who presented to [facility] 11/12/15 with acute onset RLQ abdominal pain.  Workup included a CT scan which showed acute appendicitis.  Patient was admitted and underwent procedure listed above.  Tolerated procedure well and was transferred to the floor.  He did have a postoperative ileus that took some time to resolve.  A drain was placed during surgery and was draining 265cc/24hr on POD5. On POD5 the patient was voiding well, tolerating diet, ambulating well, pain well controlled, vital signs stable, incisions c/d/i and felt stable for discharge home.  He was discharged on 1 week of augmentin. He has an appointment in [place] office with a nurse later this week for drain removal. He will follow-up with Dr. Redmond Pulling after that appointment.  Physical Exam: Gen:  Alert, NAD, pleasant Abd: Soft, obese, mildly distended, +BS, incisions C/D/I, drain site cdi with minimal serous drainage  Right abdominal drain 265cc/24hr    Medication List    STOP taking these  medications   ibuprofen 200 MG tablet Commonly known as:  ADVIL,MOTRIN     TAKE these medications   amoxicillin-clavulanate 875-125 MG tablet Commonly known as:  AUGMENTIN Take 1 tablet by mouth 2 (two) times daily.   cetirizine 10 MG tablet Commonly known as:  ZYRTEC Take 10 mg by mouth daily as needed for allergies.   EXCEDRIN PM 38-500 MG Tabs Generic drug:  Diphenhydramine-APAP (sleep) Take 1 tablet by mouth at bedtime as needed (sleep).   multivitamin Tabs tablet Take 1 tablet by mouth daily.   oxyCODONE 5 MG immediate release tablet Commonly known as:  Oxy IR/ROXICODONE Take 1-2 tablets (5-10 mg total) by mouth every 4 (four) hours as needed for moderate pain or severe pain.        Follow-up Information    Cammy Copa, MD. Schedule an appointment as soon as possible for a visit today.   Specialty:  Family Medicine Why:  for a medical oncology referral in follow-up from the abnormality on recent CT scan. Contact information: [id number] [address], [address] [place] [address] [id number]        Gayland Curry, MD. Call today.   Specialty:  General Surgery Why:  Dr. Dois Davenport office will be contacting you with an appointment. Contact information: [address] [place] [address] [phone]        [facility], [place] .   Specialty:  General Surgery Why:  Your appointment is 11/21/15 at 2pm with nurse to have drain removed. Please arrive 30 minutes prior to your appointment time to fill out necessary paperwork. Contact information: [address] [place] [phone]  Signed: Jerrye Beavers, [facility] Surgery 11/20/2015, 2:32 PM Pager: [phone] Consults: [phone] Mon-Fri 7:00 am-4:30 pm Sat-Sun 7:00 am-11:30 am  Agree with above.  Alphonsa Overall, MD, [facility] Surgery Pager: [phone] Office phone:  [phone]

## 2015-12-04 ENCOUNTER — Telehealth: Payer: Self-pay | Admitting: Hematology

## 2015-12-04 NOTE — Telephone Encounter (Signed)
Pt's wife informed pt has an 1:45 f/u appt and requested a later time. 2:30 appt was scheduled pending approval of md. In basket provider

## 2015-12-06 ENCOUNTER — Ambulatory Visit (HOSPITAL_BASED_OUTPATIENT_CLINIC_OR_DEPARTMENT_OTHER): Payer: 59

## 2015-12-06 ENCOUNTER — Telehealth: Payer: Self-pay | Admitting: Hematology

## 2015-12-06 ENCOUNTER — Ambulatory Visit (HOSPITAL_BASED_OUTPATIENT_CLINIC_OR_DEPARTMENT_OTHER): Payer: 59 | Admitting: Hematology

## 2015-12-06 ENCOUNTER — Encounter: Payer: Self-pay | Admitting: Hematology

## 2015-12-06 VITALS — BP 142/84 | HR 113 | Temp 98.3°F | Resp 18 | Ht 71.0 in | Wt 234.2 lb

## 2015-12-06 DIAGNOSIS — F1021 Alcohol dependence, in remission: Secondary | ICD-10-CM | POA: Diagnosis not present

## 2015-12-06 DIAGNOSIS — R1900 Intra-abdominal and pelvic swelling, mass and lump, unspecified site: Secondary | ICD-10-CM

## 2015-12-06 DIAGNOSIS — Z87891 Personal history of nicotine dependence: Secondary | ICD-10-CM

## 2015-12-06 DIAGNOSIS — C801 Malignant (primary) neoplasm, unspecified: Secondary | ICD-10-CM

## 2015-12-06 DIAGNOSIS — E785 Hyperlipidemia, unspecified: Secondary | ICD-10-CM

## 2015-12-06 LAB — CBC & DIFF AND RETIC
BASO%: 0.4 % (ref 0.0–2.0)
BASOS ABS: 0 10*3/uL (ref 0.0–0.1)
EOS ABS: 0.4 10*3/uL (ref 0.0–0.5)
EOS%: 4.4 % (ref 0.0–7.0)
HEMATOCRIT: 40.9 % (ref 38.4–49.9)
HEMOGLOBIN: 14.2 g/dL (ref 13.0–17.1)
IMMATURE RETIC FRACT: 3.2 % (ref 3.00–10.60)
LYMPH%: 32.9 % (ref 14.0–49.0)
MCH: 31.1 pg (ref 27.2–33.4)
MCHC: 34.7 g/dL (ref 32.0–36.0)
MCV: 89.7 fL (ref 79.3–98.0)
MONO#: 0.7 10*3/uL (ref 0.1–0.9)
MONO%: 7.4 % (ref 0.0–14.0)
NEUT#: 5.2 10*3/uL (ref 1.5–6.5)
NEUT%: 54.9 % (ref 39.0–75.0)
NRBC: 0 % (ref 0–0)
Platelets: 257 10*3/uL (ref 140–400)
RBC: 4.56 10*6/uL (ref 4.20–5.82)
RDW: 13 % (ref 11.0–14.6)
Retic %: 1.07 % (ref 0.80–1.80)
Retic Ct Abs: 48.79 10*3/uL (ref 34.80–93.90)
WBC: 9.5 10*3/uL (ref 4.0–10.3)
lymph#: 3.1 10*3/uL (ref 0.9–3.3)

## 2015-12-06 LAB — COMPREHENSIVE METABOLIC PANEL
ALBUMIN: 3.6 g/dL (ref 3.5–5.0)
ALK PHOS: 124 U/L (ref 40–150)
ALT: 28 U/L (ref 0–55)
AST: 31 U/L (ref 5–34)
Anion Gap: 11 mEq/L (ref 3–11)
BILIRUBIN TOTAL: 0.37 mg/dL (ref 0.20–1.20)
BUN: 7.4 mg/dL (ref 7.0–26.0)
CO2: 25 mEq/L (ref 22–29)
Calcium: 9.6 mg/dL (ref 8.4–10.4)
Chloride: 106 mEq/L (ref 98–109)
Creatinine: 0.8 mg/dL (ref 0.7–1.3)
GLUCOSE: 92 mg/dL (ref 70–140)
Potassium: 4.2 mEq/L (ref 3.5–5.1)
SODIUM: 141 meq/L (ref 136–145)
TOTAL PROTEIN: 7.6 g/dL (ref 6.4–8.3)

## 2015-12-06 NOTE — Progress Notes (Signed)
[name]    HEMATOLOGY/ONCOLOGY CONSULTATION NOTE  Date of Service: 12/06/2015  Patient Care Team: Aura Dials, MD as PCP - General (Family Medicine)  CHIEF COMPLAINTS/PURPOSE OF CONSULTATION:  Retroperitoneal mass encasing IVC and aorta  HISTORY OF PRESENTING ILLNESS:   James Jennings is a wonderful 45 y.o. male who has been referred to [place] by Dr Greer Pickerel MD and .Cammy Copa, MD  for evaluation and management of newly diagnosed retroperitoneal mass.  Patient has a history of dyslipidemia, obesity, smoker 24pk-yrs old, sleep apnea and underwent a complicated laparoscopic appendectomy on 11/12/2015 for ruptured appendicitis with abscess . It apparently took 3 hours to identify and remove his appendix and he was eventually discharged on October 18 . His CT scan on admission that showed a soft tissue density partially encasing the lower IVC and abdominal aorta near the bifurcation which was worrisome for a tumor possibly lymphoma .  He was referred to [place] for further evaluation. This mass has not been biopsied at this time. Patient reports that he has been falling weight loss diet and has lost about 20 pounds with the diet. No abdominal pain at this time. Healing surgical incisions. He has never had a colonoscopy in the past. No change in bowel habits. No rectal bleeding or hematochezia no melena. No change in urinary symptoms. No testicular pain or swelling. No fevers no chills no night sweats. He has had extensive travel in the past while in the [employer] and was posted in [place], [place], [place] and was brought up in [place].   MEDICAL HISTORY:  Past Medical History:  Diagnosis Date  . High cholesterol   . PONV (postoperative nausea and vomiting)   . Sleep apnea    "dx'd ~ 2008; never RX'd mask" (11/12/2015)  Sleep apnea has not officially had a sleep study and does not use CPAP machine. Obesity .Body mass index is 32.66 kg/m.  Dyslipidemia Smoker one pack per day  since age 58 years Shingles 2 months ago. Ruptured appendicitis October 2017  SURGICAL HISTORY: Past Surgical History:  Procedure Laterality Date  . ACHILLES TENDON SURGERY Left ~ 2012  . APPENDECTOMY  11/12/2015   lap appy  . CLUB FOOT RELEASE Bilateral 1971  . HERNIA REPAIR    . LAPAROSCOPIC APPENDECTOMY N/A 11/12/2015   Procedure: APPENDECTOMY LAPAROSCOPIC;  Surgeon: Greer Pickerel, MD;  Location: [facility];  Service: General;  Laterality: N/A;  . SUTURE REMOVAL Left ~ 2016-2017 X 3   "achilles; under anesthesia"  . VENTRAL HERNIA REPAIR  1994    SOCIAL HISTORY: Social History   Social History  . Marital status: Married    Spouse name: N/A  . Number of children: N/A  . Years of education: N/A   Occupational History  . Not on file.   Social History Main Topics  . Smoking status: Current Every Day Smoker    Packs/day: 1.00    Years: 30.00  . Smokeless tobacco: Former Systems developer     Comment: 11/12/2015 "quit using chew in ~ 1997"  . Alcohol use 2.4 oz/week    4 Shots of liquor per week  . Drug use: No  . Sexual activity: Yes   Other Topics Concern  . Not on file   Social History Narrative  . No narrative on file  Previously had heavy alcohol consumption . Currently no alcohol use Drinks a lot of coffee about 40 ounces a day and 2-3 Pepsi's a day .  no other drug use.  FAMILY HISTORY:  has history of heart disease in the family . 2 uncles had heart disease in their 71s . No known family history of cancer or blood disorders.    ALLERGIES:  is allergic to shellfish allergy; lactose intolerance (gi); and other.  MEDICATIONS:  Current Outpatient Prescriptions  Medication Sig Dispense Refill  . cetirizine (ZYRTEC) 10 MG tablet Take 10 mg by mouth daily as needed for allergies.    . multivitamin (ONE-A-DAY MEN'S) TABS tablet Take 1 tablet by mouth daily.     No current facility-administered medications for this visit.     REVIEW OF SYSTEMS:    10 Point review of  Systems was done is negative except as noted above.  PHYSICAL EXAMINATION: ECOG PERFORMANCE STATUS: 1 - Symptomatic but completely ambulatory  . Vitals:   12/06/15 1434  BP: (!) 142/84  Pulse: (!) 113  Resp: 18  Temp: 98.3 F (36.8 C)   Filed Weights   12/06/15 1434  Weight: 234 lb 3.2 oz (106.2 kg)   .Body mass index is 32.66 kg/m.  GENERAL:alert, in no acute distress and comfortable SKIN: skin color, texture, turgor are normal, no rashes or significant lesions EYES: normal, conjunctiva are pink and non-injected, sclera clear OROPHARYNX:no exudate, no erythema and lips, buccal mucosa, and tongue normal  NECK: supple, no JVD, thyroid normal size, non-tender, without nodularity LYMPH:  no palpable lymphadenopathy in the cervical, axillary or inguinal LUNGS: clear to auscultation with normal respiratory effort HEART: regular rate & rhythm,  no murmurs and no lower extremity edema ABDOMEN: abdomen soft, non-tender, normoactive bowel sounds , Healing surgical incisions, no palpable hepatosplenomegaly . Musculoskeletal: no cyanosis of digits and no clubbing  PSYCH: alert & oriented x 3 with fluent speech NEURO: no focal motor/sensory deficits  LABORATORY DATA:  I have reviewed the data as listed  . CBC Latest Ref Rng & Units 12/06/2015 11/15/2015 11/14/2015  WBC 4.0 - 10.3 10e3/uL 9.5 10.6(H) 14.5(H)  Hemoglobin 13.0 - 17.1 g/dL 14.2 12.6(L) 14.3  Hematocrit 38.4 - 49.9 % 40.9 36.7(L) 42.0  Platelets 140 - 400 10e3/uL 257 187 203    . CMP Latest Ref Rng & Units 12/06/2015 11/14/2015 11/13/2015  Glucose 70 - 140 mg/dl 92 195(H) 167(H)  BUN 7.0 - 26.0 mg/dL 7.4 7 6   Creatinine 0.7 - 1.3 mg/dL 0.8 1.05 0.95  Sodium 136 - 145 mEq/L 141 134(L) 136  Potassium 3.5 - 5.1 mEq/L 4.2 4.9 4.2  Chloride 101 - 111 mmol/L - 100(L) 103  CO2 22 - 29 mEq/L 25 26 27   Calcium 8.4 - 10.4 mg/dL 9.6 8.6(L) 8.1(L)  Total Protein 6.4 - 8.3 g/dL 7.6 - -  Total Bilirubin 0.20 - 1.20 mg/dL 0.37  - -  Alkaline Phos 40 - 150 U/L 124 - -  AST 5 - 34 U/L 31 - -  ALT 0 - 55 U/L 28 - -   . Lab Results  Component Value Date   LDH 185 12/06/2015     RADIOGRAPHIC STUDIES: I have personally reviewed the radiological images as listed and agreed with the findings in the report. Ct Abdomen Pelvis W Contrast  Result Date: 11/12/2015 CLINICAL DATA:  Right lower quadrant abdominal pain for 3 days with fever. History of ventral abdominal hernia repair. EXAM: CT ABDOMEN AND PELVIS WITH CONTRAST TECHNIQUE: Multidetector CT imaging of the abdomen and pelvis was performed using the standard protocol following bolus administration of intravenous contrast. CONTRAST:  113mL ISOVUE-300 IOPAMIDOL (ISOVUE-300) INJECTION 61% COMPARISON:  None. FINDINGS: Lower chest:  Calcified 10 mm left lower lobe granuloma. Hepatobiliary: Normal liver with no liver mass. Normal gallbladder with no radiopaque cholelithiasis. No biliary ductal dilatation. Pancreas: Normal, with no mass or duct dilation. Spleen: Normal size. No mass. Adrenals/Urinary Tract: Normal adrenals. Normal kidneys with no hydronephrosis and no renal mass. Normal bladder. Stomach/Bowel: Grossly normal stomach. Normal caliber small bowel. Appendix is prominently dilated (23 mm diameter). Diffuse appendiceal wall thickening. Prominent periappendiceal fat stranding. Findings are consistent with acute appendicitis. Mild reactive wall thickening in the terminal ileum and base of cecum. Vascular/Lymphatic: Atherosclerotic nonaneurysmal abdominal aorta. Patent portal, splenic, hepatic and renal veins. There is confluent soft tissue density partial encasing the lower inferior vena cava and lower abdominal aorta near the bifurcation, measuring up to the 2.4 cm thickness (series 2/ image 54). Otherwise no pathologically enlarged abdominopelvic lymph nodes. Reproductive: Normal size prostate. Other: No pneumoperitoneum, ascites or focal fluid collection. Musculoskeletal: No  aggressive appearing focal osseous lesions. Mild thoracolumbar spondylosis. IMPRESSION: 1. Acute appendicitis.  No abscess or free intraperitoneal air. 2. Confluent soft tissue density partially encasing the lower IVC and abdominal aorta near the bifurcation, worrisome for lymphoma. PET-CT would be useful for further characterization, although should be delayed until the patient has recovered from the acute appendicitis. 3. Aortic atherosclerosis. These results were called by telephone at the time of interpretation on 11/12/2015 at 2:21 pm to Dr. Threasa Beards BELFI , who verbally acknowledged these results. Electronically Signed   By: Ilona Sorrel M.D.   On: 11/12/2015 14:23    ASSESSMENT & PLAN:   45 year old Caucasian male with a history of smoking, previous heavy alcohol use with recent ruptured appendicitis with abscess requiring laparoscopic appendectomy on 11/12/2015 with   1) Incidentally Noted Retroperitoneal mass partially encasing the lower IVC abd abdominal aorta near the bifurcation. This is worrisome for possible lymphoma Could also represent other potential tumors including retroperitoneal sarcoma including others (germ cell tumors, other abdominal primaries etc) This has not been biopsied currently. PLAN -The imaging findings were discussed with the patient and his wife in details including the possible differential diagnosis . -We discussed that the findings were concerning enough that a tissue diagnosis would need to be pursued as opposed to await and watch policy . The current tumor is in a difficult to biopsy area percutaneously. -We shall get a PET/CT scan to further evaluate this mass and the presence of other lymphadenopathy or disease that might suggest an alternative primary origin, easier to biopsy site and will help staging the tumor . -We will Order some tumor markers to try to evaluate the origin of this tumorous process.  -TB quantiferon assay.  2) dyslipidemia, sleep apnea  and other diagnoses -continue f/u with PCP   Return to care with Dr. Irene Limbo in 2 weeks with PET/CT and ordered lab results.  Orders Placed This Encounter  Procedures  . NM PET Image Initial (PI) Skull Base To Thigh    Standing Status:   Future    Number of Occurrences:   1    Standing Expiration Date:   01/09/2017    Order Specific Question:   Reason for exam:    Answer:   abdominal mass concerning for lymphoma/carcinoma of unknown primary    Order Specific Question:   Preferred imaging location?    Answer:   [facility]  . CBC & Diff and Retic    Standing Status:   Future    Number of Occurrences:   1    Standing Expiration  Date:   12/05/2016  . Comprehensive metabolic panel    Standing Status:   Future    Number of Occurrences:   1    Standing Expiration Date:   12/05/2016  . Lactate dehydrogenase    Standing Status:   Future    Number of Occurrences:   1    Standing Expiration Date:   12/05/2016  . AFP tumor marker    Standing Status:   Future    Number of Occurrences:   1    Standing Expiration Date:   12/05/2016  . CEA    Standing Status:   Future    Number of Occurrences:   1    Standing Expiration Date:   12/05/2016  . Cancer antigen 19-9    Standing Status:   Future    Number of Occurrences:   1    Standing Expiration Date:   01/09/2017  . Beta HCG, Quant (tumor marker)    Standing Status:   Future    Number of Occurrences:   1    Standing Expiration Date:   12/05/2016  . Protime-INR    Standing Status:   Future    Number of Occurrences:   1    Standing Expiration Date:   12/05/2016  . APTT    Standing Status:   Future    Number of Occurrences:   1    Standing Expiration Date:   12/05/2016  . Quantiferon tb gold assay    Standing Status:   Future    Number of Occurrences:   1    Standing Expiration Date:   12/05/2016      All of the patients questions were answered with apparent satisfaction. The patient knows to call the clinic with any problems,  questions or concerns.  I spent 45 minutes counseling the patient face to face. The total time spent in the appointment was 60 minutes and more than 50% was on counseling and direct patient cares.    Sullivan Lone MD [place] AAHIVMS [facility] Hematology/Oncology Physician [facility]  (Office):       [phone] (Work cell):  [phone] (Fax):           [phone]  12/06/2015 2:47 PM

## 2015-12-06 NOTE — Telephone Encounter (Signed)
Lab added for today, per 12/06/15 los. Follow up Appointments scheduled per 12/06/15 los. AVS report and appointment schedule given to patient, per 12/06/15 los.

## 2015-12-07 LAB — CANCER ANTIGEN 19-9: CAN 19-9: 7 U/mL (ref 0–35)

## 2015-12-07 LAB — PROTHROMBIN TIME (PT)
INR: 1 (ref 0.8–1.2)
Prothrombin Time: 10.3 s (ref 9.1–12.0)

## 2015-12-07 LAB — BETA HCG QUANT (REF LAB): hCG Quant: <1 m[IU]/mL (ref 0–3)

## 2015-12-07 LAB — APTT: APTT: 29 s (ref 24–33)

## 2015-12-07 LAB — AFP TUMOR MARKER: AFP, Serum, Tumor Marker: 2.4 ng/mL (ref 0.0–8.3)

## 2015-12-09 LAB — LACTATE DEHYDROGENASE: LDH: 185 U/L (ref 125–245)

## 2015-12-09 LAB — CEA (IN HOUSE-CHCC): CEA (CHCC-IN HOUSE): 2.65 ng/mL (ref 0.00–5.00)

## 2015-12-10 ENCOUNTER — Other Ambulatory Visit: Payer: Self-pay | Admitting: *Deleted

## 2015-12-10 LAB — QUANTIFERON TB GOLD ASSAY (BLOOD)
QFT TB AG MINUS NIL VALUE: 0 IU/mL
QUANTIFERON MITOGEN VALUE: 6.17 [IU]/mL
QUANTIFERON NIL VALUE: 0.02 [IU]/mL
QUANTIFERON TB AG VALUE: 0.02 [IU]/mL
QUANTIFERON TB GOLD: NEGATIVE

## 2015-12-20 ENCOUNTER — Encounter (HOSPITAL_COMMUNITY)
Admission: RE | Admit: 2015-12-20 | Discharge: 2015-12-20 | Disposition: A | Discharge status: Home / Self Care | Payer: 59 | Source: Ambulatory Visit | Attending: Hematology | Admitting: Hematology

## 2015-12-20 DIAGNOSIS — C801 Malignant (primary) neoplasm, unspecified: Secondary | ICD-10-CM | POA: Insufficient documentation

## 2015-12-20 DIAGNOSIS — R1900 Intra-abdominal and pelvic swelling, mass and lump, unspecified site: Secondary | ICD-10-CM | POA: Insufficient documentation

## 2015-12-20 LAB — GLUCOSE, CAPILLARY: Glucose-Capillary: 130 mg/dL — ABNORMAL HIGH (ref 65–99)

## 2015-12-20 MED ORDER — FLUDEOXYGLUCOSE F - 18 (FDG) INJECTION
12.2200 | Freq: Once | INTRAVENOUS | Status: AC | PRN
  Administered 2015-12-20: 12.22 via INTRAVENOUS

## 2015-12-23 ENCOUNTER — Ambulatory Visit (HOSPITAL_BASED_OUTPATIENT_CLINIC_OR_DEPARTMENT_OTHER): Payer: 59 | Admitting: Hematology

## 2015-12-23 ENCOUNTER — Encounter: Payer: Self-pay | Admitting: Hematology

## 2015-12-23 VITALS — BP 152/89 | HR 102 | Temp 98.5°F | Resp 18 | Ht 71.0 in | Wt 239.4 lb

## 2015-12-23 DIAGNOSIS — R591 Generalized enlarged lymph nodes: Secondary | ICD-10-CM

## 2015-12-23 DIAGNOSIS — R1900 Intra-abdominal and pelvic swelling, mass and lump, unspecified site: Secondary | ICD-10-CM | POA: Diagnosis not present

## 2015-12-23 DIAGNOSIS — C801 Malignant (primary) neoplasm, unspecified: Secondary | ICD-10-CM

## 2015-12-30 NOTE — Progress Notes (Signed)
[name]    HEMATOLOGY/ONCOLOGY CLINIC NOTE  Date of Service: .12/23/2015  Patient Care Team: Aura Dials, MD as PCP - General (Family Medicine) Greer Pickerel MD (General surgery)  CHIEF COMPLAINTS/PURPOSE OF CONSULTATION:  Retroperitoneal mass encasing IVC and aorta  HISTORY OF PRESENTING ILLNESS:   James Jennings is a wonderful 45 y.o. male who has been referred to [place] by Dr Greer Pickerel MD and .Cammy Copa, MD  for evaluation and management of newly diagnosed retroperitoneal mass.  Patient has a history of dyslipidemia, obesity, smoker 60pk-yrs old, sleep apnea and underwent a complicated laparoscopic appendectomy on 11/12/2015 for ruptured appendicitis with abscess . It apparently took 3 hours to identify and remove his appendix and he was eventually discharged on October 18 . His CT scan on admission that showed a soft tissue density partially encasing the lower IVC and abdominal aorta near the bifurcation which was worrisome for a tumor possibly lymphoma .  He was referred to [place] for further evaluation. This mass has not been biopsied at this time. Patient reports that he has been falling weight loss diet and has lost about 20 pounds with the diet. No abdominal pain at this time. Healing surgical incisions. He has never had a colonoscopy in the past. No change in bowel habits. No rectal bleeding or hematochezia no melena. No change in urinary symptoms. No testicular pain or swelling. No fevers no chills no night sweats. He has had extensive travel in the past while in the [employer] and was posted in [place], [place], [place], [place] and was brought up in [place].  INTERVAL HISTORY  Patient is here with his wife to f/u on the results of his PET/CT and labs. PET/CT shows Hypermetabolic 4.6 x 2.4 cm lower retroperitoneal mass partially encasing the lower abdominal aorta and IVC, stable in size since 11/12/2015, most consistent with lymphoma. 2. Solitary hypermetabolic right inguinal lymph  node, which is nonspecific and may be representative of the suspected lymphoma. 3. Interval appendectomy. Nonspecific ill-defined hypermetabolic 1.9 x 1.3 cm soft tissue density focus in the right pelvic peritoneal sidewall medial to the right common iliac vessels and superior to the distal sigmoid colon, new since 11/12/2015, favor an inflammatory focus, although neoplasm is not excluded. Tumor markers were unrevealing. No abdominal pain. No symptoms of vascular compromise in his lower extremities.  MEDICAL HISTORY:  Past Medical History:  Diagnosis Date  . High cholesterol   . PONV (postoperative nausea and vomiting)   . Sleep apnea    "dx'd ~ 2008; never RX'd mask" (11/12/2015)  Sleep apnea has not officially had a sleep study and does not use CPAP machine. Obesity .Body mass index is 33.39 kg/m.  Dyslipidemia Smoker one pack per day since age 57 years Shingles 2 months ago. Ruptured appendicitis October 2017  SURGICAL HISTORY: Past Surgical History:  Procedure Laterality Date  . ACHILLES TENDON SURGERY Left ~ 2012  . APPENDECTOMY  11/12/2015   lap appy  . CLUB FOOT RELEASE Bilateral 1971  . HERNIA REPAIR    . LAPAROSCOPIC APPENDECTOMY N/A 11/12/2015   Procedure: APPENDECTOMY LAPAROSCOPIC;  Surgeon: Greer Pickerel, MD;  Location: [facility];  Service: General;  Laterality: N/A;  . SUTURE REMOVAL Left ~ 2016-2017 X 3   "achilles; under anesthesia"  . VENTRAL HERNIA REPAIR  1994    SOCIAL HISTORY: Social History   Social History  . Marital status: Married    Spouse name: N/A  . Number of children: N/A  . Years of education: N/A  Occupational History  . Not on file.   Social History Main Topics  . Smoking status: Current Every Day Smoker    Packs/day: 1.00    Years: 30.00  . Smokeless tobacco: Former Systems developer     Comment: 11/12/2015 "quit using chew in ~ 1997"  . Alcohol use 2.4 oz/week    4 Shots of liquor per week  . Drug use: No  . Sexual activity: Yes   Other Topics  Concern  . Not on file   Social History Narrative  . No narrative on file  Previously had heavy alcohol consumption . Currently no alcohol use Drinks a lot of coffee about 40 ounces a day and 2-3 Pepsi's a day .  no other drug use.  FAMILY HISTORY:  has history of heart disease in the family . 2 uncles had heart disease in their 76s . No known family history of cancer or blood disorders.    ALLERGIES:  is allergic to shellfish allergy; lactose intolerance (gi); and other.  MEDICATIONS:  Current Outpatient Prescriptions  Medication Sig Dispense Refill  . cetirizine (ZYRTEC) 10 MG tablet Take 10 mg by mouth daily as needed for allergies.    . multivitamin (ONE-A-DAY MEN'S) TABS tablet Take 1 tablet by mouth daily.     No current facility-administered medications for this visit.     REVIEW OF SYSTEMS:    10 Point review of Systems was done is negative except as noted above.  PHYSICAL EXAMINATION: ECOG PERFORMANCE STATUS: 1 - Symptomatic but completely ambulatory  . Vitals:   12/23/15 1537  BP: (!) 152/89  Pulse: (!) 102  Resp: 18  Temp: 98.5 F (36.9 C)   Filed Weights   12/23/15 1537  Weight: 239 lb 6.4 oz (108.6 kg)   .Body mass index is 33.39 kg/m.  GENERAL:alert, in no acute distress and comfortable SKIN: skin color, texture, turgor are normal, no rashes or significant lesions EYES: normal, conjunctiva are pink and non-injected, sclera clear OROPHARYNX:no exudate, no erythema and lips, buccal mucosa, and tongue normal  NECK: supple, no JVD, thyroid normal size, non-tender, without nodularity LYMPH:  no palpable lymphadenopathy in the cervical, axillary or inguinal LUNGS: clear to auscultation with normal respiratory effort HEART: regular rate & rhythm,  no murmurs and no lower extremity edema ABDOMEN: abdomen soft, non-tender, normoactive bowel sounds , Healing surgical incisions, no palpable hepatosplenomegaly . Musculoskeletal: no cyanosis of digits and  no clubbing  PSYCH: alert & oriented x 3 with fluent speech NEURO: no focal motor/sensory deficits  LABORATORY DATA:  I have reviewed the data as listed  . CBC Latest Ref Rng & Units 12/06/2015 11/15/2015 11/14/2015  WBC 4.0 - 10.3 10e3/uL 9.5 10.6(H) 14.5(H)  Hemoglobin 13.0 - 17.1 g/dL 14.2 12.6(L) 14.3  Hematocrit 38.4 - 49.9 % 40.9 36.7(L) 42.0  Platelets 140 - 400 10e3/uL 257 187 203    . CMP Latest Ref Rng & Units 12/06/2015 11/14/2015 11/13/2015  Glucose 70 - 140 mg/dl 92 195(H) 167(H)  BUN 7.0 - 26.0 mg/dL 7.4 7 6   Creatinine 0.7 - 1.3 mg/dL 0.8 1.05 0.95  Sodium 136 - 145 mEq/L 141 134(L) 136  Potassium 3.5 - 5.1 mEq/L 4.2 4.9 4.2  Chloride 101 - 111 mmol/L - 100(L) 103  CO2 22 - 29 mEq/L 25 26 27   Calcium 8.4 - 10.4 mg/dL 9.6 8.6(L) 8.1(L)  Total Protein 6.4 - 8.3 g/dL 7.6 - -  Total Bilirubin 0.20 - 1.20 mg/dL 0.37 - -  Alkaline Phos 40 -  150 U/L 124 - -  AST 5 - 34 U/L 31 - -  ALT 0 - 55 U/L 28 - -   . Lab Results  Component Value Date   LDH 185 12/06/2015   Component     Latest Ref Rng & Units 12/06/2015  QUANTIFERON INCUBATION      Comment  QUANTIFERON TB GOLD     Negative Negative  QUANTIFERON CRITERIA      Comment  QUANTIFERON TB AG VALUE     IU/mL 0.02  Quantiferon Nil Value     IU/mL 0.02  QUANTIFERON MITOGEN VALUE     IU/mL 6.17  QFT TB AG MINUS NIL VALUE     IU/mL 0.00  Interpretation      Comment  LDH     125 - 245 U/L 185  AFP, Serum, Tumor Marker     0.0 - 8.3 ng/mL 2.4  CA 19-9     0 - 35 U/mL 7  hCG Quant     0 - 3 mIU/mL <1  CEA ([facility]In House)     0.00 - 5.00 ng/mL 2.65    RADIOGRAPHIC STUDIES: I have personally reviewed the radiological images as listed and agreed with the findings in the report. Nm Pet Image Initial (pi) Skull Base To Thigh  Result Date: 12/20/2015 CLINICAL DATA:  Initial treatment strategy for retroperitoneal mass partially encasing the lower aortocaval region, detected incidentally on recent CT  abdomen/pelvis study performed for acute appendicitis. EXAM: NUCLEAR MEDICINE PET SKULL BASE TO THIGH TECHNIQUE: 12.2 mCi F-18 FDG was injected intravenously. Full-ring PET imaging was performed from the skull base to thigh after the radiotracer. CT data was obtained and used for attenuation correction and anatomic localization. FASTING BLOOD GLUCOSE:  Value: 130 mg/dl COMPARISON:  11/12/2015 CT abdomen/pelvis. FINDINGS: NECK No hypermetabolic lymph nodes in the neck. Nonspecific symmetric hypermetabolism in the nasopharynx and palatine tonsils, without discrete mass correlate on the CT images, favor physiologic/reactive etiology. CHEST Coarsely calcified granulomatous right lower paratracheal and left hilar lymph nodes. No hypermetabolic axillary, mediastinal or hilar lymph nodes. No pleural effusions. Calcified 11 mm left lower lobe granuloma. Mild paraseptal emphysema. No acute consolidative airspace disease, lung masses or significant noncalcified pulmonary nodules. ABDOMEN/PELVIS Hypermetabolic 4.6 x 2.4 cm lower retroperitoneal mass partially encasing the lower abdominal aorta and IVC (series 4/image 146) with max SUV 8.7, stable in size since 11/12/2015. Interval appendectomy. Expected mild postoperative fat stranding adjacent to the appendectomy site. Nonspecific ill-defined hypermetabolic 1.9 x 1.3 cm soft tissue density focus in the right pelvic peritoneal sidewall with max SUV 17.2 (Series 4/image 169), located medial to the right common iliac vessels and superior to the distal sigmoid colon, new since 11/12/2015. Solitary hypermetabolic 1.3 cm right inguinal lymph node with max SUV 5.0 (series 4/image 190). No additional enlarged or hypermetabolic abdominopelvic lymph nodes. No abnormal hypermetabolic activity within the liver, pancreas, adrenal glands, or spleen. Atherosclerotic nonaneurysmal abdominal aorta. No pneumoperitoneum or ascites. SKELETON No focal hypermetabolic activity to suggest skeletal  metastasis. IMPRESSION: 1. Hypermetabolic 4.6 x 2.4 cm lower retroperitoneal mass partially encasing the lower abdominal aorta and IVC, stable in size since 11/12/2015, most consistent with lymphoma. 2. Solitary hypermetabolic right inguinal lymph node, which is nonspecific and may be representative of the suspected lymphoma. 3. Interval appendectomy. Nonspecific ill-defined hypermetabolic 1.9 x 1.3 cm soft tissue density focus in the right pelvic peritoneal sidewall medial to the right common iliac vessels and superior to the distal sigmoid colon, new since 11/12/2015,  favor an inflammatory focus, although neoplasm is not excluded. Recommend attention on short-term follow-up CT abdomen/pelvis with IV and oral contrast. 4. Normal size non-hypermetabolic spleen. 5. Nonspecific symmetric hypermetabolism in the nasopharynx and palatine tonsils, without discrete mass correlate on the CT images, favor physiologic/reactive etiology. 6. Aortic atherosclerosis. Electronically Signed   By: Ilona Sorrel M.D.   On: 12/20/2015 11:55    ASSESSMENT & PLAN:   45 year old Caucasian male with a history of smoking, previous heavy alcohol use with recent ruptured appendicitis with abscess requiring laparoscopic appendectomy on 11/12/2015 with   1) Incidentally Noted Retroperitoneal mass partially encasing the lower IVC abd abdominal aorta near the bifurcation. This likely represents a slow growing lymphoma given normal LDH, no size change since 11/12/2015 and SUV of 8.7 He is also noted to have a solitary rt inguinal LN that might represent the same lymphoma process and would be the easiest to biopsy.  Tumor marker levels are unrevealing as documented above.  PLAN  -discussed PET/CT results and lab results in details with the patient and his wife -we will refer him to Dr Greer Pickerel his surgeon to consider Rt inguinal LN excisional biopsy since lymphoma is primary concern. -I shall see him back about 4-5 days after LN  biopsy to discuss results.  2) dyslipidemia, sleep apnea and other diagnoses -continue f/u with PCP  Urgent referral to general surgery for rt inguinal LN biopsy for diagnosis. Return to care with Dr. Irene Limbo in 4-5 days after LN biopsy for review of pathology results and further management.  Orders Placed This Encounter  Procedures  . Ambulatory referral to General Surgery    Referral Priority:   Urgent    Referral Type:   Surgical    Referral Reason:   Specialty Services Required    Requested Specialty:   General Surgery    Number of Visits Requested:   1      All of the patients questions were answered with apparent satisfaction. The patient knows to call the clinic with any problems, questions or concerns.  I spent 20 minutes counseling the patient face to face. The total time spent in the appointment was 25 minutes and more than 50% was on counseling and direct patient cares.    Sullivan Lone MD [place] AAHIVMS [facility] Hematology/Oncology Physician [facility]  (Office):       [phone] (Work cell):  [phone] (Fax):           [phone]  01/13/2016 12:27 PM

## 2016-01-13 ENCOUNTER — Other Ambulatory Visit: Payer: Self-pay | Admitting: *Deleted

## 2016-01-13 ENCOUNTER — Telehealth: Payer: Self-pay | Admitting: *Deleted

## 2016-01-13 NOTE — Telephone Encounter (Signed)
Per Staff message, LVM for Dr. Greer Pickerel at surgical center for referral for urgent biopsy of right inguinal lymph node.  Will contact patient upon call back from surgical center.

## 2016-01-14 ENCOUNTER — Telehealth: Payer: Self-pay | Admitting: *Deleted

## 2016-01-14 NOTE — Telephone Encounter (Signed)
Spoke with patient regarding apt set up with Dr. Redmond Pulling at surgery center for consult for lymph node biopsy scheduled 12/13 @ 11am, explained that it is required to have consult prior to setting up apt for actual biopsy.

## 2016-01-15 ENCOUNTER — Ambulatory Visit: Payer: Self-pay | Admitting: General Surgery

## 2016-01-15 NOTE — H&P (Signed)
James Jennings 01/15/2016 11:08 AM Location: [facility] Patient #: [id number] DOB: 10-03-1970 Married / Language: English / Race: White Male  History of Present Illness James M. Kiegan Macaraeg MD; 01/15/2016 12:18 PM) The patient is a 45 year old male who presents with a complaint of enlarge lymph nodes. He comes in for additional follow-up after being initially met in the hospital for acute appendicitis. On his admitting CT he was found to have a soft tissue mass around the vena cava and abdominal aorta. He was referred to medical oncology and subsequent underwent a PET/CT. The lower retroperitoneal mass was hypermetabolic on PET imaging consistent with lymphoma. He was also found to have a hypermetabolic right inguinal lymph node measuring 1.3 cm. He is referred back to discuss excisional biopsy of the right inguinal lymph node. Otherwise he denies any changes since he was seen at his postoperative appointment. He denies any fever, chills, nausea, vomiting, diarrhea or constipation. He continues to smoke.   Problem List/Past Medical James M. Redmond Pulling, MD; 01/15/2016 12:18 PM) S/P LAPAROSCOPIC APPENDECTOMY (Z90.49) s/p lap appy 11/12/15 CHANGE OR REMOVAL OF DRAINS (Z48.03) s/p lap appy 11/12/15 LYMPHADENOPATHY, INGUINAL (R59.0) RETROPERITONEAL MASS (R19.00)  Past Surgical History James M. Redmond Pulling, MD; 01/15/2016 12:18 PM) Appendectomy Foot Surgery Left.  Diagnostic Studies History James M. Redmond Pulling, MD; 01/15/2016 12:18 PM) Colonoscopy never  Allergies (James Jennings, CMA; 01/15/2016 11:09 AM) Shellfish-derived Products Lactose Intolerance Diarrhea. No Known Drug Allergies 12/06/2015 (Marked as Inactive)  Medication History (James Jennings, CMA; 01/15/2016 11:09 AM) ZyrTEC (10MG Tablet, Oral) Active. Diphenhydramine w/APAP (38-500MG Tablet, Oral) Active. Multiple Vitamin (Oral) Active. (one a day men's) Excedrin (250-250-65MG Tablet, Oral) Active. Medications  Reconciled  Social History James M. Redmond Pulling, MD; 01/15/2016 12:18 PM) Tobacco use Current every day smoker. Illicit drug use Remotely quit drug use. Caffeine use Carbonated beverages, Coffee. Alcohol use Occasional alcohol use.  Family History James M. Redmond Pulling, MD; 01/15/2016 12:18 PM) No pertinent family history First Degree Relatives    Vitals (James Jennings; 01/15/2016 11:09 AM) 01/15/2016 11:09 AM Weight: 238 lb Height: 71in Body Surface Area: 2.27 m Body Mass Index: 33.19 kg/m  Temp.: 97.19F(Temporal)  Pulse: 81 (Regular)  BP: 124/74 (Sitting, Left Arm, Standard)      Physical Exam James Hiss M. Merilynn Haydu MD; 01/15/2016 12:16 PM)  General Mental Status-Alert. General Appearance-Consistent with stated age. Hydration-Well hydrated. Voice-Normal.  Head and Neck Head-normocephalic, atraumatic with no lesions or palpable masses. Trachea-midline. Thyroid Gland Characteristics - normal size and consistency.  Eye Eyeball - Bilateral-Normal. Sclera/Conjunctiva - Bilateral-No scleral icterus.  Chest and Lung Exam Chest and lung exam reveals -quiet, even and easy respiratory effort with no use of accessory muscles and on auscultation, normal breath sounds, no adventitious sounds and normal vocal resonance. Inspection Chest Wall - Normal. Back - normal.  Breast - Did not examine.  Cardiovascular Cardiovascular examination reveals -normal heart sounds, regular rate and rhythm with no murmurs and normal pedal pulses bilaterally.  Abdomen Inspection Inspection of the abdomen reveals - No Hernias. Skin - Scar - Note: well healed trocar scars. Palpation/Percussion Palpation and Percussion of the abdomen reveal - Soft, Non Tender, No Rebound tenderness, No Rigidity (guarding) and No hepatosplenomegaly. Auscultation Auscultation of the abdomen reveals - Bowel sounds normal.  Peripheral Vascular Upper Extremity Palpation - Pulses  bilaterally normal.  Neurologic Neurologic evaluation reveals -alert and oriented x 3 with no impairment of recent or remote memory. Mental Status-Normal.  Neuropsychiatric The patient's mood and affect are described as -normal. Judgment  and Insight-insight is appropriate concerning matters relevant to self.  Musculoskeletal Normal Exam - Left-Upper Extremity Strength Normal and Lower Extremity Strength Normal. Normal Exam - Right-Upper Extremity Strength Normal and Lower Extremity Strength Normal.  Lymphatic Head & Neck  General Head & Neck Lymphatics: Bilateral - Description - Normal. Axillary - Did not examine. Femoral & Inguinal -Note:no real enlarged LN.     Assessment & Plan James M.  MD; 01/15/2016 12:18 PM)  RETROPERITONEAL MASS (R19.00) Impression: has medical oncology f/u today; per oncology  LYMPHADENOPATHY, INGUINAL (R59.0) Impression: He has a slightly enlarged right inguinal lymph node on CT imaging. This would be the most likely target to go after for excisional biopsy. We discussed the risk and benefits of surgery including but not limited to bleeding, infection, injury to surrounding structures, seroma, lymph leak, blood clot formation, nondiagnostic specimen, perioperative cardiac and pulmonary events as well as typical recovery period. All of his questions were asked and answered. We discussed that he was at increased risk for infection due to his tobacco use.  Current Plans You are being scheduled for surgery- Our schedulers will call you.  You should hear from our office's scheduling department within 5 working days about the location, date, and time of surgery. We try to make accommodations for patient's preferences in scheduling surgery, but sometimes the OR schedule or the surgeon's schedule prevents [place] from making those accommodations.  If you have not heard from our office [phone]) in 5 working days, call the office and ask  for your surgeon's nurse.  If you have other questions about your diagnosis, plan, or surgery, call the office and ask for your surgeon's nurse.  James Ruff. Redmond Pulling, MD, FACS General, Bariatric, & Minimally Invasive Surgery [facility], [place]

## 2016-01-16 ENCOUNTER — Other Ambulatory Visit: Payer: Self-pay | Admitting: *Deleted

## 2016-01-17 ENCOUNTER — Other Ambulatory Visit: Payer: Self-pay | Admitting: *Deleted

## 2016-01-17 NOTE — Progress Notes (Signed)
Patient called with pre-op instructions. To arrive at 1230 Monday 01/20/2016. Instructed to remain NPO after midnight. May take Zyrtec in the am. Instruct ed to drink 1  Boost Breeze at 10:30 (two hours prior to arrival). Pt was instructed that he may pick up the boost here in our pre-admissions office or he may obtain it at a local pharmacy. Denies cardiac, renal, vascular or pulmonary disease.

## 2016-01-20 ENCOUNTER — Ambulatory Visit (HOSPITAL_COMMUNITY): Payer: 59 | Admitting: Anesthesiology

## 2016-01-20 ENCOUNTER — Encounter (HOSPITAL_COMMUNITY)
Admission: RE | Disposition: A | Discharge status: Home / Self Care | Payer: Self-pay | Source: Ambulatory Visit | Attending: General Surgery

## 2016-01-20 ENCOUNTER — Encounter (HOSPITAL_COMMUNITY): Payer: Self-pay | Admitting: *Deleted

## 2016-01-20 ENCOUNTER — Ambulatory Visit: Payer: 59 | Admitting: Hematology

## 2016-01-20 ENCOUNTER — Ambulatory Visit (HOSPITAL_COMMUNITY)
Admission: RE | Admit: 2016-01-20 | Discharge: 2016-01-20 | Disposition: A | Discharge status: Home / Self Care | Payer: 59 | Source: Ambulatory Visit | Attending: General Surgery | Admitting: General Surgery

## 2016-01-20 ENCOUNTER — Telehealth: Payer: Self-pay | Admitting: Hematology

## 2016-01-20 ENCOUNTER — Other Ambulatory Visit: Payer: Self-pay | Admitting: Lab

## 2016-01-20 ENCOUNTER — Other Ambulatory Visit (HOSPITAL_BASED_OUTPATIENT_CLINIC_OR_DEPARTMENT_OTHER): Payer: 59

## 2016-01-20 ENCOUNTER — Ambulatory Visit (HOSPITAL_BASED_OUTPATIENT_CLINIC_OR_DEPARTMENT_OTHER): Payer: 59 | Admitting: Hematology & Oncology

## 2016-01-20 VITALS — BP 144/86 | HR 85 | Temp 97.6°F | Wt 236.1 lb

## 2016-01-20 DIAGNOSIS — R590 Localized enlarged lymph nodes: Secondary | ICD-10-CM

## 2016-01-20 DIAGNOSIS — G4730 Sleep apnea, unspecified: Secondary | ICD-10-CM | POA: Insufficient documentation

## 2016-01-20 DIAGNOSIS — F17210 Nicotine dependence, cigarettes, uncomplicated: Secondary | ICD-10-CM | POA: Diagnosis not present

## 2016-01-20 DIAGNOSIS — E669 Obesity, unspecified: Secondary | ICD-10-CM | POA: Insufficient documentation

## 2016-01-20 DIAGNOSIS — C8513 Unspecified B-cell lymphoma, intra-abdominal lymph nodes: Secondary | ICD-10-CM | POA: Insufficient documentation

## 2016-01-20 DIAGNOSIS — Z6833 Body mass index (BMI) 33.0-33.9, adult: Secondary | ICD-10-CM | POA: Insufficient documentation

## 2016-01-20 DIAGNOSIS — R1909 Other intra-abdominal and pelvic swelling, mass and lump: Secondary | ICD-10-CM

## 2016-01-20 HISTORY — PX: INGUINAL LYMPH NODE BIOPSY: SHX5865

## 2016-01-20 LAB — COMPREHENSIVE METABOLIC PANEL
ALT: 19 U/L (ref 0–55)
AST: 17 U/L (ref 5–34)
Albumin: 3.7 g/dL (ref 3.5–5.0)
Alkaline Phosphatase: 132 U/L (ref 40–150)
Anion Gap: 10 mEq/L (ref 3–11)
BUN: 10.6 mg/dL (ref 7.0–26.0)
CHLORIDE: 106 meq/L (ref 98–109)
CO2: 26 meq/L (ref 22–29)
Calcium: 9.4 mg/dL (ref 8.4–10.4)
Creatinine: 0.8 mg/dL (ref 0.7–1.3)
EGFR: >90 mL/min/{1.73_m2} (ref >90)
GLUCOSE: 113 mg/dL (ref 70–140)
POTASSIUM: 4.4 meq/L (ref 3.5–5.1)
SODIUM: 142 meq/L (ref 136–145)
Total Bilirubin: 0.34 mg/dL (ref 0.20–1.20)
Total Protein: 7.5 g/dL (ref 6.4–8.3)

## 2016-01-20 LAB — CBC WITH DIFFERENTIAL (CANCER CENTER ONLY)
BASO#: 0 10*3/uL (ref 0.0–0.2)
BASO%: 0.1 % (ref 0.0–2.0)
EOS%: 3.6 % (ref 0.0–7.0)
Eosinophils Absolute: 0.3 10*3/uL (ref 0.0–0.5)
HCT: 43.3 % (ref 38.7–49.9)
HGB: 15 g/dL (ref 13.0–17.1)
LYMPH#: 2.3 10*3/uL (ref 0.9–3.3)
LYMPH%: 28.8 % (ref 14.0–48.0)
MCH: 30.9 pg (ref 28.0–33.4)
MCHC: 34.6 g/dL (ref 32.0–35.9)
MCV: 89 fL (ref 82–98)
MONO#: 0.5 10*3/uL (ref 0.1–0.9)
MONO%: 6.7 % (ref 0.0–13.0)
NEUT#: 4.9 10*3/uL (ref 1.5–6.5)
NEUT%: 60.8 % (ref 40.0–80.0)
PLATELETS: 249 10*3/uL (ref 145–400)
RBC: 4.85 10*6/uL (ref 4.20–5.70)
RDW: 13.1 % (ref 11.1–15.7)
WBC: 8 10*3/uL (ref 4.0–10.0)

## 2016-01-20 LAB — LACTATE DEHYDROGENASE: LDH: 173 U/L (ref 125–245)

## 2016-01-20 SURGERY — BIOPSY, LYMPH NODE, INGUINAL, OPEN
Anesthesia: General | Site: Groin | Laterality: Right

## 2016-01-20 MED ORDER — NEOSTIGMINE METHYLSULFATE 5 MG/5ML IV SOSY
PREFILLED_SYRINGE | INTRAVENOUS | Status: AC
  Filled 2016-01-20: qty 10

## 2016-01-20 MED ORDER — PROPOFOL 10 MG/ML IV BOLUS
INTRAVENOUS | Status: AC
  Filled 2016-01-20: qty 20

## 2016-01-20 MED ORDER — OXYCODONE HCL 5 MG PO TABS: 5.0000 mg | ORAL_TABLET | ORAL | Status: DC | PRN

## 2016-01-20 MED ORDER — BUPIVACAINE-EPINEPHRINE (PF) 0.25% -1:200000 IJ SOLN
INTRAMUSCULAR | Status: AC
  Filled 2016-01-20: qty 30

## 2016-01-20 MED ORDER — MIDAZOLAM HCL 2 MG/2ML IJ SOLN
INTRAMUSCULAR | Status: AC
  Filled 2016-01-20: qty 2

## 2016-01-20 MED ORDER — EPINEPHRINE PF 1 MG/ML IJ SOLN
INTRAMUSCULAR | Status: AC
  Filled 2016-01-20: qty 1

## 2016-01-20 MED ORDER — LACTATED RINGERS IV SOLN
INTRAVENOUS | Status: DC
  Administered 2016-01-20 (×2): via INTRAVENOUS

## 2016-01-20 MED ORDER — 0.9 % SODIUM CHLORIDE (POUR BTL) OPTIME
TOPICAL | Status: DC | PRN
  Administered 2016-01-20: 1000 mL

## 2016-01-20 MED ORDER — LIDOCAINE 2% (20 MG/ML) 5 ML SYRINGE
INTRAMUSCULAR | Status: AC
  Filled 2016-01-20: qty 20

## 2016-01-20 MED ORDER — LIDOCAINE HCL (CARDIAC) 20 MG/ML IV SOLN
INTRAVENOUS | Status: DC | PRN
  Administered 2016-01-20: 100 mg via INTRAVENOUS

## 2016-01-20 MED ORDER — EPHEDRINE 5 MG/ML INJ
INTRAVENOUS | Status: AC
  Filled 2016-01-20: qty 20

## 2016-01-20 MED ORDER — PHENYLEPHRINE HCL 10 MG/ML IJ SOLN
INTRAMUSCULAR | Status: DC | PRN
  Administered 2016-01-20 (×2): 80 ug via INTRAVENOUS
  Administered 2016-01-20: 40 ug via INTRAVENOUS

## 2016-01-20 MED ORDER — ACETAMINOPHEN 500 MG PO TABS
1000.0000 mg | ORAL_TABLET | ORAL | Status: AC
  Administered 2016-01-20: 1000 mg via ORAL
  Filled 2016-01-20: qty 2

## 2016-01-20 MED ORDER — PROPOFOL 10 MG/ML IV BOLUS
INTRAVENOUS | Status: DC | PRN
  Administered 2016-01-20: 200 mg via INTRAVENOUS

## 2016-01-20 MED ORDER — EPHEDRINE SULFATE 50 MG/ML IJ SOLN
INTRAMUSCULAR | Status: DC | PRN
  Administered 2016-01-20 (×2): 10 mg via INTRAVENOUS

## 2016-01-20 MED ORDER — CELECOXIB 200 MG PO CAPS
400.0000 mg | ORAL_CAPSULE | ORAL | Status: AC
  Administered 2016-01-20: 400 mg via ORAL
  Filled 2016-01-20: qty 2

## 2016-01-20 MED ORDER — OXYCODONE HCL 5 MG PO TABS: 5.0000 mg | ORAL_TABLET | Freq: Four times a day (QID) | ORAL | 0 refills | Status: DC | PRN

## 2016-01-20 MED ORDER — MIDAZOLAM HCL 5 MG/5ML IJ SOLN
INTRAMUSCULAR | Status: DC | PRN
  Administered 2016-01-20: 2 mg via INTRAVENOUS

## 2016-01-20 MED ORDER — BUPIVACAINE HCL (PF) 0.5 % IJ SOLN
INTRAMUSCULAR | Status: AC
  Filled 2016-01-20: qty 30

## 2016-01-20 MED ORDER — CEFAZOLIN SODIUM-DEXTROSE 2-4 GM/100ML-% IV SOLN
2.0000 g | INTRAVENOUS | Status: AC
  Administered 2016-01-20: 2 g via INTRAVENOUS
  Filled 2016-01-20: qty 100

## 2016-01-20 MED ORDER — GABAPENTIN 300 MG PO CAPS
300.0000 mg | ORAL_CAPSULE | ORAL | Status: AC
  Administered 2016-01-20: 300 mg via ORAL
  Filled 2016-01-20: qty 1

## 2016-01-20 MED ORDER — PHENYLEPHRINE 40 MCG/ML (10ML) SYRINGE FOR IV PUSH (FOR BLOOD PRESSURE SUPPORT)
PREFILLED_SYRINGE | INTRAVENOUS | Status: AC
  Filled 2016-01-20: qty 20

## 2016-01-20 MED ORDER — FENTANYL CITRATE (PF) 100 MCG/2ML IJ SOLN
INTRAMUSCULAR | Status: DC | PRN
  Administered 2016-01-20: 100 ug via INTRAVENOUS

## 2016-01-20 MED ORDER — FENTANYL CITRATE (PF) 100 MCG/2ML IJ SOLN: 25.0000 ug | INTRAMUSCULAR | Status: DC | PRN

## 2016-01-20 MED ORDER — FENTANYL CITRATE (PF) 100 MCG/2ML IJ SOLN
INTRAMUSCULAR | Status: AC
  Filled 2016-01-20: qty 2

## 2016-01-20 MED ORDER — KETOROLAC TROMETHAMINE 30 MG/ML IJ SOLN
INTRAMUSCULAR | Status: AC
  Filled 2016-01-20: qty 1

## 2016-01-20 MED ORDER — OXYCODONE HCL 5 MG/5ML PO SOLN: 5.0000 mg | Freq: Once | ORAL | Status: AC | PRN

## 2016-01-20 MED ORDER — KETOROLAC TROMETHAMINE 30 MG/ML IJ SOLN
INTRAMUSCULAR | Status: DC | PRN
  Administered 2016-01-20: 30 mg via INTRAVENOUS

## 2016-01-20 MED ORDER — BUPIVACAINE-EPINEPHRINE (PF) 0.25% -1:200000 IJ SOLN
INTRAMUSCULAR | Status: DC | PRN
  Administered 2016-01-20: 20 mL

## 2016-01-20 MED ORDER — OXYCODONE HCL 5 MG PO TABS
5.0000 mg | ORAL_TABLET | Freq: Once | ORAL | Status: AC | PRN
  Administered 2016-01-20: 5 mg via ORAL

## 2016-01-20 MED ORDER — DEXAMETHASONE SODIUM PHOSPHATE 10 MG/ML IJ SOLN
INTRAMUSCULAR | Status: DC | PRN
  Administered 2016-01-20: 10 mg via INTRAVENOUS

## 2016-01-20 MED ORDER — CHLORHEXIDINE GLUCONATE CLOTH 2 % EX PADS: 6.0000 | MEDICATED_PAD | Freq: Once | CUTANEOUS | Status: DC

## 2016-01-20 MED ORDER — ONDANSETRON HCL 4 MG/2ML IJ SOLN
INTRAMUSCULAR | Status: AC
  Filled 2016-01-20: qty 2

## 2016-01-20 MED ORDER — OXYCODONE HCL 5 MG PO TABS
ORAL_TABLET | ORAL | Status: AC
  Filled 2016-01-20: qty 1

## 2016-01-20 MED ORDER — ONDANSETRON HCL 4 MG/2ML IJ SOLN: 4.0000 mg | Freq: Four times a day (QID) | INTRAMUSCULAR | Status: DC | PRN

## 2016-01-20 SURGICAL SUPPLY — 41 items
CHLORAPREP W/TINT 26ML (MISCELLANEOUS) ×3 IMPLANT
CLIP TI LARGE 6 (CLIP) ×3 IMPLANT
CLIP TI MEDIUM 24 (CLIP) ×3 IMPLANT
CLIP TI WIDE RED SMALL 24 (CLIP) ×3 IMPLANT
CONT SPEC 4OZ CLIKSEAL STRL BL (MISCELLANEOUS) ×3 IMPLANT
COVER SURGICAL LIGHT HANDLE (MISCELLANEOUS) ×3 IMPLANT
DECANTER SPIKE VIAL GLASS SM (MISCELLANEOUS) ×3 IMPLANT
DERMABOND ADVANCED (GAUZE/BANDAGES/DRESSINGS) ×2
DERMABOND ADVANCED .7 DNX12 (GAUZE/BANDAGES/DRESSINGS) ×1 IMPLANT
DRAPE LAPAROTOMY 100X72 PEDS (DRAPES) ×3 IMPLANT
ELECT CAUTERY BLADE 6.4 (BLADE) ×3 IMPLANT
ELECT REM PT RETURN 9FT ADLT (ELECTROSURGICAL) ×3
ELECTRODE REM PT RTRN 9FT ADLT (ELECTROSURGICAL) ×1 IMPLANT
GAUZE SPONGE 4X4 16PLY XRAY LF (GAUZE/BANDAGES/DRESSINGS) ×3 IMPLANT
GLOVE BIO SURGEON STRL SZ 6.5 (GLOVE) ×2 IMPLANT
GLOVE BIO SURGEONS STRL SZ 6.5 (GLOVE) ×1
GLOVE BIOGEL M STRL SZ7.5 (GLOVE) ×3 IMPLANT
GLOVE BIOGEL PI IND STRL 6.5 (GLOVE) ×2 IMPLANT
GLOVE BIOGEL PI IND STRL 8 (GLOVE) ×1 IMPLANT
GLOVE BIOGEL PI INDICATOR 6.5 (GLOVE) ×4
GLOVE BIOGEL PI INDICATOR 8 (GLOVE) ×2
GOWN STRL REUS W/ TWL LRG LVL3 (GOWN DISPOSABLE) ×2 IMPLANT
GOWN STRL REUS W/TWL 2XL LVL3 (GOWN DISPOSABLE) ×3 IMPLANT
GOWN STRL REUS W/TWL LRG LVL3 (GOWN DISPOSABLE) ×4
KIT BASIN OR (CUSTOM PROCEDURE TRAY) ×3 IMPLANT
KIT ROOM TURNOVER OR (KITS) ×3 IMPLANT
NEEDLE HYPO 25GX1X1/2 BEV (NEEDLE) ×3 IMPLANT
NS IRRIG 1000ML POUR BTL (IV SOLUTION) ×3 IMPLANT
PACK SURGICAL SETUP 50X90 (CUSTOM PROCEDURE TRAY) ×3 IMPLANT
PAD ARMBOARD 7.5X6 YLW CONV (MISCELLANEOUS) ×3 IMPLANT
PENCIL BUTTON HOLSTER BLD 10FT (ELECTRODE) ×3 IMPLANT
SUT MNCRL AB 4-0 PS2 18 (SUTURE) ×3 IMPLANT
SUT VIC AB 3-0 SH 27 (SUTURE) ×2
SUT VIC AB 3-0 SH 27XBRD (SUTURE) ×1 IMPLANT
SYR BULB IRRIGATION 50ML (SYRINGE) ×3 IMPLANT
SYR CONTROL 10ML LL (SYRINGE) ×3 IMPLANT
TOWEL OR 17X24 6PK STRL BLUE (TOWEL DISPOSABLE) ×3 IMPLANT
TOWEL OR 17X26 10 PK STRL BLUE (TOWEL DISPOSABLE) ×3 IMPLANT
TUBE CONNECTING 12'X1/4 (SUCTIONS) ×1
TUBE CONNECTING 12X1/4 (SUCTIONS) ×2 IMPLANT
YANKAUER SUCT BULB TIP NO VENT (SUCTIONS) ×3 IMPLANT

## 2016-01-20 NOTE — Telephone Encounter (Signed)
lvm to inform pt of 12/18 appt date/time per LOS °

## 2016-01-20 NOTE — Anesthesia Procedure Notes (Signed)
Procedure Name: LMA Insertion Date/Time: 01/20/2016 4:22 PM Performed by: Ollen Bowl Pre-anesthesia Checklist: Patient identified, Emergency Drugs available, Suction available, Patient being monitored and Timeout performed Patient Re-evaluated:Patient Re-evaluated prior to inductionOxygen Delivery Method: Circle system utilized and Simple face mask Preoxygenation: Pre-oxygenation with 100% oxygen Intubation Type: IV induction Ventilation: Mask ventilation without difficulty LMA: LMA inserted LMA Size: 5.0 Number of attempts: 1 Airway Equipment and Method: Patient positioned with wedge pillow Placement Confirmation: positive ETCO2 and breath sounds checked- equal and bilateral Tube secured with: Tape Dental Injury: Teeth and Oropharynx as per pre-operative assessment

## 2016-01-20 NOTE — H&P (View-Only) (Signed)
James Jennings 01/15/2016 11:08 AM Location: [facility] Patient #: [id number] DOB: 10-03-1970 Married / Language: English / Race: White Male  History of Present Illness James M. Aitanna Haubner MD; 01/15/2016 12:18 PM) The patient is a 45 year old male who presents with a complaint of enlarge lymph nodes. He comes in for additional follow-up after being initially met in the hospital for acute appendicitis. On his admitting CT he was found to have a soft tissue mass around the vena cava and abdominal aorta. He was referred to medical oncology and subsequent underwent a PET/CT. The lower retroperitoneal mass was hypermetabolic on PET imaging consistent with lymphoma. He was also found to have a hypermetabolic right inguinal lymph node measuring 1.3 cm. He is referred back to discuss excisional biopsy of the right inguinal lymph node. Otherwise he denies any changes since he was seen at his postoperative appointment. He denies any fever, chills, nausea, vomiting, diarrhea or constipation. He continues to smoke.   Problem List/Past Medical James M. Redmond Pulling, MD; 01/15/2016 12:18 PM) S/P LAPAROSCOPIC APPENDECTOMY (Z90.49) s/p lap appy 11/12/15 CHANGE OR REMOVAL OF DRAINS (Z48.03) s/p lap appy 11/12/15 LYMPHADENOPATHY, INGUINAL (R59.0) RETROPERITONEAL MASS (R19.00)  Past Surgical History James M. Redmond Pulling, MD; 01/15/2016 12:18 PM) Appendectomy Foot Surgery Left.  Diagnostic Studies History James M. Redmond Pulling, MD; 01/15/2016 12:18 PM) Colonoscopy never  Allergies (James Jennings, CMA; 01/15/2016 11:09 AM) Shellfish-derived Products Lactose Intolerance Diarrhea. No Known Drug Allergies 12/06/2015 (Marked as Inactive)  Medication History (James Jennings, CMA; 01/15/2016 11:09 AM) ZyrTEC (10MG Tablet, Oral) Active. Diphenhydramine w/APAP (38-500MG Tablet, Oral) Active. Multiple Vitamin (Oral) Active. (one a day men's) Excedrin (250-250-65MG Tablet, Oral) Active. Medications  Reconciled  Social History James M. Redmond Pulling, MD; 01/15/2016 12:18 PM) Tobacco use Current every day smoker. Illicit drug use Remotely quit drug use. Caffeine use Carbonated beverages, Coffee. Alcohol use Occasional alcohol use.  Family History James M. Redmond Pulling, MD; 01/15/2016 12:18 PM) No pertinent family history First Degree Relatives    Vitals (James Jennings; 01/15/2016 11:09 AM) 01/15/2016 11:09 AM Weight: 238 lb Height: 71in Body Surface Area: 2.27 m Body Mass Index: 33.19 kg/m  Temp.: 97.19F(Temporal)  Pulse: 81 (Regular)  BP: 124/74 (Sitting, Left Arm, Standard)      Physical Exam James Hiss M. Santina Trillo MD; 01/15/2016 12:16 PM)  General Mental Status-Alert. General Appearance-Consistent with stated age. Hydration-Well hydrated. Voice-Normal.  Head and Neck Head-normocephalic, atraumatic with no lesions or palpable masses. Trachea-midline. Thyroid Gland Characteristics - normal size and consistency.  Eye Eyeball - Bilateral-Normal. Sclera/Conjunctiva - Bilateral-No scleral icterus.  Chest and Lung Exam Chest and lung exam reveals -quiet, even and easy respiratory effort with no use of accessory muscles and on auscultation, normal breath sounds, no adventitious sounds and normal vocal resonance. Inspection Chest Wall - Normal. Back - normal.  Breast - Did not examine.  Cardiovascular Cardiovascular examination reveals -normal heart sounds, regular rate and rhythm with no murmurs and normal pedal pulses bilaterally.  Abdomen Inspection Inspection of the abdomen reveals - No Hernias. Skin - Scar - Note: well healed trocar scars. Palpation/Percussion Palpation and Percussion of the abdomen reveal - Soft, Non Tender, No Rebound tenderness, No Rigidity (guarding) and No hepatosplenomegaly. Auscultation Auscultation of the abdomen reveals - Bowel sounds normal.  Peripheral Vascular Upper Extremity Palpation - Pulses  bilaterally normal.  Neurologic Neurologic evaluation reveals -alert and oriented x 3 with no impairment of recent or remote memory. Mental Status-Normal.  Neuropsychiatric The patient's mood and affect are described as -normal. Judgment  and Insight-insight is appropriate concerning matters relevant to self.  Musculoskeletal Normal Exam - Left-Upper Extremity Strength Normal and Lower Extremity Strength Normal. Normal Exam - Right-Upper Extremity Strength Normal and Lower Extremity Strength Normal.  Lymphatic Head & Neck  General Head & Neck Lymphatics: Bilateral - Description - Normal. Axillary - Did not examine. Femoral & Inguinal -Note:no real enlarged LN.     Assessment & Plan James M. Ilithyia Titzer MD; 01/15/2016 12:18 PM)  RETROPERITONEAL MASS (R19.00) Impression: has medical oncology f/u today; per oncology  LYMPHADENOPATHY, INGUINAL (R59.0) Impression: He has a slightly enlarged right inguinal lymph node on CT imaging. This would be the most likely target to go after for excisional biopsy. We discussed the risk and benefits of surgery including but not limited to bleeding, infection, injury to surrounding structures, seroma, lymph leak, blood clot formation, nondiagnostic specimen, perioperative cardiac and pulmonary events as well as typical recovery period. All of his questions were asked and answered. We discussed that he was at increased risk for infection due to his tobacco use.  Current Plans You are being scheduled for surgery- Our schedulers will call you.  You should hear from our office's scheduling department within 5 working days about the location, date, and time of surgery. We try to make accommodations for patient's preferences in scheduling surgery, but sometimes the OR schedule or the surgeon's schedule prevents [place] from making those accommodations.  If you have not heard from our office [phone]) in 5 working days, call the office and ask  for your surgeon's nurse.  If you have other questions about your diagnosis, plan, or surgery, call the office and ask for your surgeon's nurse.  James Ruff. Redmond Pulling, MD, FACS General, Bariatric, & Minimally Invasive Surgery [facility], [place]

## 2016-01-20 NOTE — Progress Notes (Signed)
Hematology and Oncology Follow Up Visit  James Jennings [id number] 26-May-1970 44 y.o. 01/20/2016   Principle Diagnosis:   Retroperitoneal mass  Current Therapy:    Observation     Interim History:  James Jennings is in for his first office visit. He had been seen at the main [facility]. However, it is logistically easier for him to come out to our office.  He has a very interesting history. He had a appendectomy back in October. Apparently there were some issues with the appendectomy. At the time, he had a CT scan which showed this retro-. [name] mass. This appeared to be encasing the lower inferior vena cava and lower abdominal aorta. It measured 2.4 cm in thickness. There do not appear to be any obvious enlarged lymph nodes. He was noted also have a calcified 10 mm left lower lobe granuloma.  He had lab work done. The lab work was pretty much on remarkable. His CEA was 2.65. His CA 19-9 was 7. His alpha-fetoprotein was 2.4.  His laboratory studies overall were unremarkable. His LDH was 173. His CBC also looked okay.  He recently had a PET scan done. This was done on November 17. This showed a hypermetabolic 4.6 x 2.4 cm retroperitoneal mass. This was stable in size compared to the CT scan. May solitary hypermetabolic right inguinal lymph node is noted. Otherwise, nothing else was noted. The spleen appeared to be non-metabolic. Bones all looked fine. The calcified granuloma in the left lung did not light up. However, a nonspecific 1.9 x 1.3 cm right pelvic sidewall mass was noted.  He does smoke. He probably has about a 35-pack-year history of tobacco use. He is not involve any type of occupational hazards.  There is no history of risk factors for hepatitis or HIV.  There is no family history of malignancy.  He comes in with his wife. Both are very very nice. They aren't very eloquent in [place] and can provide a lot of history.  He has his biopsy of the right inguinal lymph node  today.   Overall, his performance status is ECOG 0.  Medications: No current facility-administered medications for this visit.  No current outpatient prescriptions on file.  Facility-Administered Medications Ordered in Other Visits:  .  [START ON 01/21/2016] ceFAZolin (ANCEF) IVPB 2g/100 mL premix, 2 g, Intravenous, On Call to OR, Greer Pickerel, MD .  6 CHG cloth bath night before surgery, , , Once **AND** [START ON 01/21/2016] 6 CHG cloth bath AM of surgery, , , Once **AND** Chlorhexidine Gluconate Cloth 2 % PADS 6 each, 6 each, Topical, Once **AND** Chlorhexidine Gluconate Cloth 2 % PADS 6 each, 6 each, Topical, Once, Greer Pickerel, MD .  lactated ringers infusion, , Intravenous, Continuous, Oleta Mouse, MD, Last Rate: 10 mL/hr at 01/20/16 1323  Allergies:  Allergies  Allergen Reactions  . Shellfish Allergy Anaphylaxis  . Lactose Intolerance (Gi) Diarrhea  . Mango Flavor Swelling    MANGO(S) LIPS SWELL    Past Medical History, Surgical history, Social history, and Family History were reviewed and updated.  Review of Systems:  As above  Physical Exam:  weight is 236 lb 1.9 oz (107.1 kg). His oral temperature is 97.6 F (36.4 C). His blood pressure is 144/86 (abnormal) and his pulse is 85.   Wt Readings from Last 3 Encounters:  01/20/16 236 lb (107 kg)  01/20/16 236 lb 1.9 oz (107.1 kg)  12/23/15 239 lb 6.4 oz (108.6 kg)      Well-developed  and well-nourished white male in no obvious distress. Head and neck exam shows no ocular or oral lesions. There are no palpable cervical or supraclavicular lymph nodes. Lungs are clear bilaterally. Cardiac exam regular rate and rhythm with no murmurs, rubs or bruits. Abdomen is soft. He is mildly obese. He has no fluid wave. There is no palpable liver or spleen tip. Axillary exam shows no bilateral axillary adenopathy. Extremities shows no clubbing, cyanosis or edema. Neurological exam shows no focal neurological deficits. Skin exam shows  no rashes, ecchymoses or petechia.  Lab Results  Component Value Date   WBC 8.0 01/20/2016   HGB 15.0 01/20/2016   HCT 43.3 01/20/2016   MCV 89 01/20/2016   PLT 249 01/20/2016     Chemistry      Component Value Date/Time   NA 142 01/20/2016 0848   K 4.4 01/20/2016 0848   CL 100 (L) 11/14/2015 0503   CO2 26 01/20/2016 0848   BUN 10.6 01/20/2016 0848   CREATININE 0.8 01/20/2016 0848      Component Value Date/Time   CALCIUM 9.4 01/20/2016 0848   ALKPHOS 132 01/20/2016 0848   AST 17 01/20/2016 0848   ALT 19 01/20/2016 0848   BILITOT 0.34 01/20/2016 0848         Impression and Plan: James Jennings is A 52 her old white male. He was on have his retroperitoneal mass incidentally at the time of appendicitis workup.  One would have to think that this would be a lymphoma at this is a malignancy. A germ cell tumor also would be reasonable given that he is a male that is not that old. However, he has not noted any type of testicular swelling.  I suppose this also could be some type of nonmalignant process. Idiopathic retroperitoneal fibrosis is a possibility area and sarcoidosis is also possible although he is not of the right gender or ethnic group.  We will just have to wait to see what the pathology shows. Hopefully this lymph node is malignant and not reactive 20 had the appendicitis. If this is reactive, then he is going to need an open procedure to get at this retroperitoneal mass.  I spent about an hour with he and his wife. They're both very nice. They have a 35-year-old son. We need to make sure that he is around to see his son graduated from college and get married.  It was fun talking with he and his wife. Again they're both very astute and understand very well what needs to be done.  Once again the path report back, then we will move ahead and get him back in and talk what treatment options might be.   [name], MD 12/18/20173:14 PM

## 2016-01-20 NOTE — Interval H&P Note (Signed)
History and Physical Interval Note:  01/20/2016 3:09 PM  James Jennings  has presented today for surgery, with the diagnosis of enlarged right inguinal lymph node  The various methods of treatment have been discussed with the patient and family. After consideration of risks, benefits and other options for treatment, the patient has consented to  Procedure(s): EXCISIONAL BIOPSY OF RIGHT INGUINAL LYMPH NODE (Right) as a surgical intervention .  The patient's history has been reviewed, patient examined, no change in status, stable for surgery.  I have reviewed the patient's chart and labs.  Questions were answered to the patient's satisfaction.    [name], MD, [place], Bariatric, & Minimally Invasive Surgery [facility], [place]  [facility]

## 2016-01-20 NOTE — Transfer of Care (Signed)
Immediate Anesthesia Transfer of Care Note  Patient: James Jennings  Procedure(s) Performed: Procedure(s): EXCISIONAL BIOPSY OF RIGHT INGUINAL LYMPH NODE (Right)  Patient Location: PACU  Anesthesia Type:General  Level of Consciousness: awake, alert  and oriented  Airway & Oxygen Therapy: Patient Spontanous Breathing and Patient connected to nasal cannula oxygen  Post-op Assessment: Report given to RN and Post -op Vital signs reviewed and stable  Post vital signs: Reviewed and stable  Last Vitals:  Vitals:   01/20/16 1255 01/20/16 1734  BP: 132/78 130/79  Pulse: 91 100  Resp: 20 10  Temp: 36.8 C 36.2 C    Last Pain:  Vitals:   01/20/16 1255  TempSrc: Oral         Complications: No apparent anesthesia complications

## 2016-01-20 NOTE — Op Note (Signed)
01/20/2016  5:27 PM  PATIENT:  James Jennings  45 y.o. male  PRE-OPERATIVE DIAGNOSIS:  enlarged right inguinal lymph node, periaortic and vena caval soft tissue mass concerning for lymphoma  POST-OPERATIVE DIAGNOSIS:  Same  PROCEDURE:  Procedure(s): EXCISIONAL BIOPSY OF RIGHT INGUINAL LYMPH NODE  SURGEON:  Surgeon(s): Greer Pickerel, MD  ASSISTANTS: none   ANESTHESIA:   general  DRAINS: none   LOCAL MEDICATIONS USED:  MARCAINE     SPECIMEN:  Source of Specimen:  Right inguinal lymph node, lymphoma workup  EBL: Minimal  DISPOSITION OF SPECIMEN:  PATHOLOGY  COUNTS:  YES  INDICATION FOR PROCEDURE: 45 year old Caucasian male who was admitted last month with acute appendicitis. On his CT scan he was found to have a retroperitoneal soft tissue mass encasing the distal aorta and vena cava. This was concerning for lymphoma. He is referred to medical oncology to performed a PET/CT. The PET/CT did show positivity in this retroperitoneal mass along with a long nodule in right inguinal lymph node. This is all concerning for lymphoma. He was sent back to discuss excisional biopsy of the right internal lymph node for diagnosis. Please see my note for discussion regarding risk and benefits of surgery  PROCEDURE: After attaining informed consent and marking the right groin in the holding area with the patient confirming the operative side he was taken to the OR 12 at [facility] placed supine on the operating table. Sequential compression devices were placed. His lower abdomen right groin and proximal thigh were then prepped and draped in the usual standard surgical fashion with ChloraPrep. He received IV antibiotic prior to skin incision. He received oral Tylenol and Neurontin preoperatively. A surgical timeout was performed. I reviewed the imaging which revealed a superficial lymph node lateral to the femoral vessels about 1.5 cm from the skin surface. Upon deep palpation of the right groin I was  unable to palpate a firm soft tissue mass. Oblique incision was made directly over this for possibly 5 cm. Subcutaneous tissue was divided with electrocautery. A self-retaining retractors placed. I was able to palpate and visualize the abnormal lymph node. It was circumferentially mobilized from surrounding structures with care using the Bovie electrocautery as well as permanent clips. The specimen was excised in its entirety. There is no palpable remaining abnormalities. This lymph node was superficial and lateral to the femoral vessels. The cavity was irrigated and hemostasis was achieved. Local was infiltrated. The deep subcutaneous tissue was closed with inverted interrupted 3-0 Vicryl sutures. Skin was closed with a running 4-0 Monocryl in a subsequent fashion. Dermabond was applied. The patient was extubated and taken to the recovery room in stable condition. All needle, instrument, and sponge counts were correct 2.  PLAN OF CARE: Discharge to home after PACU  PATIENT DISPOSITION:  PACU - hemodynamically stable.   Delay start of Pharmacological VTE agent (>24hrs) due to surgical blood loss or risk of bleeding:  not applicable  [name], MD, FACS General, Bariatric, & Minimally Invasive Surgery [facility], [place]

## 2016-01-20 NOTE — Anesthesia Preprocedure Evaluation (Addendum)
Anesthesia Evaluation  Patient identified by MRN, date of birth, ID band Patient awake    Reviewed: Allergy & Precautions, H&P , NPO status , Patient's Chart, lab work & pertinent test results  History of Anesthesia Complications (+) PONV  Airway Mallampati: II   Neck ROM: full    Dental   Pulmonary sleep apnea , Current Smoker,    breath sounds clear to auscultation       Cardiovascular negative cardio ROS   Rhythm:regular Rate:Normal     Neuro/Psych    GI/Hepatic   Endo/Other  obese  Renal/GU      Musculoskeletal   Abdominal   Peds  Hematology   Anesthesia Other Findings   Reproductive/Obstetrics                            Anesthesia Physical Anesthesia Plan  ASA: II  Anesthesia Plan: General   Post-op Pain Management:    Induction: Intravenous  Airway Management Planned: LMA  Additional Equipment:   Intra-op Plan:   Post-operative Plan: Extubation in OR  Informed Consent: I have reviewed the patients History and Physical, chart, labs and discussed the procedure including the risks, benefits and alternatives for the proposed anesthesia with the patient or authorized representative who has indicated his/her understanding and acceptance.   Dental advisory given  Plan Discussed with: CRNA  Anesthesia Plan Comments:        Anesthesia Quick Evaluation

## 2016-01-20 NOTE — Discharge Instructions (Signed)
[place] Office Phone Number [phone]   POST OP INSTRUCTIONS  Always review your discharge instruction sheet given to you by the facility where your surgery was performed.  IF YOU HAVE DISABILITY OR FAMILY LEAVE FORMS, YOU MUST BRING THEM TO THE OFFICE FOR PROCESSING.  DO NOT GIVE THEM TO YOUR DOCTOR.  1. A prescription for pain medication may be given to you upon discharge.  Take your pain medication as prescribed, if needed.  If narcotic pain medicine is not needed, then you may take acetaminophen (Tylenol) &/or ibuprofen (Advil) as needed. 2. Take your usually prescribed medications unless otherwise directed 3. If you need a refill on your pain medication, please contact your pharmacy.  They will contact our office to request authorization.  Prescriptions will not be filled after 5pm or on week-ends. 4. You should eat very light the first 24 hours after surgery, such as soup, crackers, pudding, etc.  Resume your normal diet the day after surgery. 5. Most patients will experience some swelling and bruising in the incision.  Ice packs will help.  Swelling and bruising can take several days to resolve.  6. It is common to experience some constipation if taking pain medication after surgery.  Increasing fluid intake and taking a stool softener will usually help or prevent this problem from occurring.  A mild laxative (Milk of Magnesia or Miralax) should be taken according to package directions if there are no bowel movements after 48 hours. 7.  If your surgeon used skin glue on the incision, you may shower in 24 hours.  The glue will flake off over the next 2-3 weeks.  Any sutures or staples will be removed at the office during your follow-up visit. 8. ACTIVITIES:  You may resume regular daily activities (gradually increasing) beginning the next day.  Wearing a good support bra or sports bra minimizes pain and swelling.  You may have sexual intercourse when it is  comfortable. a. You may drive when you no longer are taking prescription pain medication, you can comfortably wear a seatbelt, and you can safely maneuver your car and apply brakes. b. RETURN TO WORK:   9. You should see your doctor in the office for a follow-up appointment approximately two weeks after your surgery.  Your doctors nurse will typically make your follow-up appointment when she calls you with your pathology report.  Expect your pathology report 5 business days after your surgery.  You may call to check if you do not hear from [place] after three days. OTHER INSTRUCTIONS:  WHEN TO CALL YOUR DOCTOR: 1. Fever over 101.0 2. Nausea and/or vomiting. 3. Extreme swelling or bruising. 4. Continued bleeding from incision. 5. Increased pain, redness, or drainage from the incision.  The clinic staff is available to answer your questions during regular business hours.  Please dont hesitate to call and ask to speak to one of the nurses for clinical concerns.  If you have a medical emergency, go to the nearest emergency room or call 911.  A surgeon from [facility] is always on call at the hospital.  For further questions, please visit [email or web address]

## 2016-01-21 ENCOUNTER — Encounter (HOSPITAL_COMMUNITY): Payer: Self-pay | Admitting: General Surgery

## 2016-01-21 LAB — BETA HCG QUANT (REF LAB)

## 2016-01-21 LAB — AFP TUMOR MARKER: AFP, SERUM, TUMOR MARKER: 2.4 ng/mL (ref 0.0–8.3)

## 2016-01-21 LAB — IGG, IGA, IGM
IGA/IMMUNOGLOBULIN A, SERUM: 170 mg/dL (ref 90–386)
IGG (IMMUNOGLOBIN G), SERUM: 865 mg/dL (ref 700–1600)
IgM, Qn, Serum: 89 mg/dL (ref 20–172)

## 2016-01-21 LAB — BETA 2 MICROGLOBULIN, SERUM: BETA 2: 1.6 mg/L (ref 0.6–2.4)

## 2016-01-21 NOTE — Anesthesia Postprocedure Evaluation (Signed)
Anesthesia Post Note  Patient: James Jennings  Procedure(s) Performed: Procedure(s) (LRB): EXCISIONAL BIOPSY OF RIGHT INGUINAL LYMPH NODE (Right)  Patient location during evaluation: PACU Anesthesia Type: General Level of consciousness: awake and alert and patient cooperative Pain management: pain level controlled Vital Signs Assessment: post-procedure vital signs reviewed and stable Respiratory status: spontaneous breathing and respiratory function stable Cardiovascular status: stable Anesthetic complications: no        Last Vitals:  Vitals:   01/20/16 1820 01/20/16 1823  BP: 134/76   Pulse:  97  Resp:  19  Temp:      Last Pain:  Vitals:   01/20/16 1803  TempSrc:   PainSc: 4    Pain Goal:                 Stephen Turnbaugh S

## 2016-01-22 ENCOUNTER — Telehealth: Payer: Self-pay | Admitting: *Deleted

## 2016-01-22 NOTE — Telephone Encounter (Addendum)
Patient aware of results  ----- Message from [name], MD sent at 01/21/2016  8:28 AM EST ----- Call - immunoglobulin level is ok!!  pete

## 2016-01-30 ENCOUNTER — Other Ambulatory Visit: Payer: Self-pay | Admitting: Hematology & Oncology

## 2016-01-30 DIAGNOSIS — C8223 Follicular lymphoma grade III, unspecified, intra-abdominal lymph nodes: Secondary | ICD-10-CM

## 2016-02-04 ENCOUNTER — Other Ambulatory Visit: Payer: Self-pay | Admitting: General Surgery

## 2016-02-05 ENCOUNTER — Ambulatory Visit (HOSPITAL_COMMUNITY)
Admission: RE | Admit: 2016-02-05 | Discharge: 2016-02-05 | Disposition: A | Discharge status: Home / Self Care | Payer: 59 | Source: Ambulatory Visit | Attending: Hematology & Oncology | Admitting: Hematology & Oncology

## 2016-02-05 ENCOUNTER — Encounter (HOSPITAL_COMMUNITY): Payer: Self-pay

## 2016-02-05 DIAGNOSIS — Z79899 Other long term (current) drug therapy: Secondary | ICD-10-CM | POA: Insufficient documentation

## 2016-02-05 DIAGNOSIS — G4730 Sleep apnea, unspecified: Secondary | ICD-10-CM | POA: Diagnosis not present

## 2016-02-05 DIAGNOSIS — F17210 Nicotine dependence, cigarettes, uncomplicated: Secondary | ICD-10-CM | POA: Diagnosis not present

## 2016-02-05 DIAGNOSIS — C8223 Follicular lymphoma grade III, unspecified, intra-abdominal lymph nodes: Secondary | ICD-10-CM | POA: Diagnosis present

## 2016-02-05 DIAGNOSIS — E7800 Pure hypercholesterolemia, unspecified: Secondary | ICD-10-CM | POA: Diagnosis not present

## 2016-02-05 DIAGNOSIS — D7589 Other specified diseases of blood and blood-forming organs: Secondary | ICD-10-CM | POA: Diagnosis not present

## 2016-02-05 DIAGNOSIS — C8510 Unspecified B-cell lymphoma, unspecified site: Secondary | ICD-10-CM | POA: Diagnosis not present

## 2016-02-05 LAB — PROTIME-INR
INR: 0.88
Prothrombin Time: 11.9 seconds (ref 11.4–15.2)

## 2016-02-05 LAB — CBC
HEMATOCRIT: 42.4 % (ref 39.0–52.0)
HEMOGLOBIN: 15.2 g/dL (ref 13.0–17.0)
MCH: 31.4 pg (ref 26.0–34.0)
MCHC: 35.8 g/dL (ref 30.0–36.0)
MCV: 87.6 fL (ref 78.0–100.0)
Platelets: 223 10*3/uL (ref 150–400)
RBC: 4.84 MIL/uL (ref 4.22–5.81)
RDW: 13 % (ref 11.5–15.5)
WBC: 8 10*3/uL (ref 4.0–10.5)

## 2016-02-05 LAB — BONE MARROW EXAM

## 2016-02-05 LAB — APTT: APTT: 31 s (ref 24–36)

## 2016-02-05 MED ORDER — MIDAZOLAM HCL 2 MG/2ML IJ SOLN
INTRAMUSCULAR | Status: AC | PRN
  Administered 2016-02-05 (×5): 1 mg via INTRAVENOUS

## 2016-02-05 MED ORDER — FENTANYL CITRATE (PF) 100 MCG/2ML IJ SOLN
INTRAMUSCULAR | Status: AC
  Filled 2016-02-05: qty 4

## 2016-02-05 MED ORDER — FENTANYL CITRATE (PF) 100 MCG/2ML IJ SOLN
INTRAMUSCULAR | Status: AC | PRN
  Administered 2016-02-05: 25 ug via INTRAVENOUS
  Administered 2016-02-05: 50 ug via INTRAVENOUS
  Administered 2016-02-05: 25 ug via INTRAVENOUS

## 2016-02-05 MED ORDER — MIDAZOLAM HCL 2 MG/2ML IJ SOLN
INTRAMUSCULAR | Status: AC
  Filled 2016-02-05: qty 6

## 2016-02-05 MED ORDER — SODIUM CHLORIDE 0.9 % IV SOLN
INTRAVENOUS | Status: DC
  Administered 2016-02-05: 08:00:00 via INTRAVENOUS

## 2016-02-05 MED ORDER — HYDROCODONE-ACETAMINOPHEN 5-325 MG PO TABS: 1.0000 | ORAL_TABLET | ORAL | Status: DC | PRN

## 2016-02-05 NOTE — Discharge Instructions (Signed)
Moderate Conscious Sedation, Adult, Care After °These instructions provide you with information about caring for yourself after your procedure. Your health care provider may also give you more specific instructions. Your treatment has been planned according to current medical practices, but problems sometimes occur. Call your health care provider if you have any problems or questions after your procedure. °What can I expect after the procedure? °After your procedure, it is common: °· To feel sleepy for several hours. °· To feel clumsy and have poor balance for several hours. °· To have poor judgment for several hours. °· To vomit if you eat too soon. °Follow these instructions at home: °For at least 24 hours after the procedure:  ° °· Do not: °¨ Participate in activities where you could fall or become injured. °¨ Drive. °¨ Use heavy machinery. °¨ Drink alcohol. °¨ Take sleeping pills or medicines that cause drowsiness. °¨ Make important decisions or sign legal documents. °¨ Take care of children on your own. °· Rest. °Eating and drinking  °· Follow the diet recommended by your health care provider. °· If you vomit: °¨ Drink water, juice, or soup when you can drink without vomiting. °¨ Make sure you have little or no nausea before eating solid foods. °General instructions  °· Have a responsible adult stay with you until you are awake and alert. °· Take over-the-counter and prescription medicines only as told by your health care provider. °· If you smoke, do not smoke without supervision. °· Keep all follow-up visits as told by your health care provider. This is important. °Contact a health care provider if: °· You keep feeling nauseous or you keep vomiting. °· You feel light-headed. °· You develop a rash. °· You have a fever. °Get help right away if: °· You have trouble breathing. °This information is not intended to replace advice given to you by your health care provider. Make sure you discuss any questions you  have with your health care provider. °Document Released: 11/09/2012 Document Revised: 06/24/2015 Document Reviewed: 05/11/2015 °Elsevier Interactive Patient Education © 2017 [employer] ° ° °Bone Marrow Aspiration and Bone Marrow Biopsy, Adult, Care After °This sheet gives you information about how to care for yourself after your procedure. Your health care provider may also give you more specific instructions. If you have problems or questions, contact your health care provider. °What can I expect after the procedure? °After the procedure, it is common to have: °· Mild pain and tenderness. °· Swelling. °· Bruising. °Follow these instructions at home: °· Take over-the-counter or prescription medicines only as told by your health care provider. °· Do not take baths, swim, or use a hot tub until your health care provider approves. Ask if you can take a shower or have a sponge bath. °· Follow instructions from your health care provider about how to take care of the puncture site. Make sure you: °¨ Wash your hands with soap and water before you change your bandage (dressing). If soap and water are not available, use hand sanitizer. °¨ Change your dressing as told by your health care provider. °· Check your puncture site every day for signs of infection. Check for: °¨ More redness, swelling, or pain. °¨ More fluid or blood. °¨ Warmth. °¨ Pus or a bad smell. °· Return to your normal activities as told by your health care provider. Ask your health care provider what activities are safe for you. °· Do not drive for 24 hours if you were given a medicine to help   all follow-up visits as told by your health care provider. This is important. Contact a health care provider if:  You have more redness, swelling, or pain around the puncture site.  You have more fluid or blood coming from the puncture site.  Your puncture site feels warm to the touch.  You have pus or a bad smell coming from the  puncture site.  You have a fever.  Your pain is not controlled with medicine. This information is not intended to replace advice given to you by your health care provider. Make sure you discuss any questions you have with your health care provider. Document Released: 08/08/2004 Document Revised: 08/09/2015 Document Reviewed: 07/03/2015 Elsevier Interactive Patient Education  2017 [employer].

## 2016-02-05 NOTE — Procedures (Signed)
Successful CT-guided bone marrow biopsy.  Two aspirates and two cores obtained.  No immediate complication.  Minimal blood loss.

## 2016-02-05 NOTE — H&P (Signed)
Chief Complaint: Patient was seen in consultation today for lymphoma  Referring Physician(s):  Dr. [name]  Supervising Physician: Markus Daft  Patient Status: [facility] - Out-pt  History of Present Illness: [facility] is a 46 y.o. male with past medical history of sleep apnea, hypercholesterolemia, presented to [facility] with abdominal pain found to have acute appendicitis.  CT imaging for his appendicitis also showed confluent soft tissue density around the lower IVC and abdominal aorta worrisome for lymphoma.   PET scan obtained 12/20/15 showed: 1. Hypermetabolic 4.6 x 2.4 cm lower retroperitoneal mass partially encasing the lower abdominal aorta and IVC, stable in size since 11/12/2015, most consistent with lymphoma. 2. Solitary hypermetabolic right inguinal lymph node, which is nonspecific and may be representative of the suspected lymphoma. 3. Interval appendectomy. Nonspecific ill-defined hypermetabolic 1.9 x 1.3 cm soft tissue density focus in the right pelvic peritoneal sidewall medial to the right common iliac vessels and superior to the distal sigmoid colon, new since 11/12/2015, favor an inflammatory focus, although neoplasm is not excluded. Recommend attention on short-term follow-up CT abdomen/pelvis with IV and oral contrast. 4. Normal size non-hypermetabolic spleen. 5. Nonspecific symmetric hypermetabolism in the nasopharynx and palatine tonsils, without discrete mass correlate on the CT images, favor physiologic/reactive etiology. 6. Aortic atherosclerosis.  Patient underwent surgical excision of the right inguinal lymph node 01/20/16 by Dr. Greer Pickerel.  Results from this excision showed markedly atypical lymphoid proliferation consistent with non-hodgkin's B-cell lymphoma.  Patient presents to IR today for bone marrow biopsy at the request of Dr. Burney Gauze for further evaluation of his lymphoma.   Patient has been NPO since last night.  He does not take blood  thinners.   Past Medical History:  Diagnosis Date  . H/O ventral hernia   . High cholesterol   . PONV (postoperative nausea and vomiting)   . Sleep apnea    "dx'd ~ 2008; never RX'd mask" (11/12/2015)    Past Surgical History:  Procedure Laterality Date  . ACHILLES TENDON SURGERY Left ~ 2012  . APPENDECTOMY  11/12/2015   lap appy  . CLUB FOOT RELEASE Bilateral 1971  . HERNIA REPAIR    . INGUINAL LYMPH NODE BIOPSY Right 01/20/2016   Procedure: EXCISIONAL BIOPSY OF RIGHT INGUINAL LYMPH NODE;  Surgeon: Greer Pickerel, MD;  Location: [facility];  Service: General;  Laterality: Right;  . LAPAROSCOPIC APPENDECTOMY N/A 11/12/2015   Procedure: APPENDECTOMY LAPAROSCOPIC;  Surgeon: Greer Pickerel, MD;  Location: [facility];  Service: General;  Laterality: N/A;  . SUTURE REMOVAL Left ~ 2016-2017 X 3   "achilles; under anesthesia"  . VENTRAL HERNIA REPAIR  1994    Allergies: Shellfish allergy; Lactose intolerance (gi); and Mango flavor  Medications: Prior to Admission medications   Medication Sig Start Date End Date Taking? Authorizing Provider  cetirizine (ZYRTEC) 10 MG tablet Take 10 mg by mouth daily as needed for allergies.   Yes Historical Provider, MD  Ibuprofen 200 MG CAPS Take 400 mg by mouth at bedtime as needed.   Yes Historical Provider, MD  multivitamin (ONE-A-DAY MEN'S) TABS tablet Take 1 tablet by mouth daily.   Yes Historical Provider, MD  oxyCODONE (OXY IR/ROXICODONE) 5 MG immediate release tablet Take 1-2 tablets (5-10 mg total) by mouth every 6 (six) hours as needed for moderate pain, severe pain or breakthrough pain. 01/20/16  Yes Greer Pickerel, MD     History reviewed. No pertinent family history.  Social History   Social History  . Marital  status: Married    Spouse name: N/A  . Number of children: N/A  . Years of education: N/A   Social History Main Topics  . Smoking status: Current Every Day Smoker    Packs/day: 1.00    Years: 30.00    Types: Cigarettes  . Smokeless  tobacco: Former Systems developer    Types: Snuff     Comment: 11/12/2015 "quit using chew in ~ 1997"  . Alcohol use 2.4 oz/week    4 Shots of liquor per week     Comment: occas drink 1-2 times per week   . Drug use: No  . Sexual activity: Yes   Other Topics Concern  . None   Social History Narrative  . None     Review of Systems  Constitutional: Negative for activity change, appetite change, fatigue and fever.  Respiratory: Negative for cough and shortness of breath.   Cardiovascular: Negative for chest pain.  Gastrointestinal: Negative for abdominal pain.  Musculoskeletal: Negative for back pain.  Psychiatric/Behavioral: Negative for behavioral problems and confusion.    Vital Signs: BP 131/77 (BP Location: Left Arm)   Pulse 90   Temp 97.9 F (36.6 C) (Oral)   Resp 18   Ht 5' 11.75" (1.822 m)   Wt 234 lb (106.1 kg)   SpO2 99%   BMI 31.96 kg/m   Physical Exam  Constitutional: He is oriented to person, place, and time. He appears well-developed. No distress.  Cardiovascular: Normal rate, regular rhythm and normal heart sounds.   Pulmonary/Chest: Effort normal and breath sounds normal. No respiratory distress.  Abdominal: Soft.  Neurological: He is alert and oriented to person, place, and time.  Skin: Skin is warm and dry.  Psychiatric: He has a normal mood and affect. His behavior is normal. Judgment and thought content normal.  Nursing note and vitals reviewed.   Mallampati Score:  MD Evaluation Airway: WNL Heart: WNL Abdomen: WNL Chest/ Lungs: WNL ASA  Classification: 3 Mallampati/Airway Score: Two  Imaging: No results found.  Labs:  CBC:  Recent Labs  11/15/15 0940 12/06/15 1606 01/20/16 0848 02/05/16 0729  WBC 10.6* 9.5 8.0 8.0  HGB 12.6* 14.2 15.0 15.2  HCT 36.7* 40.9 43.3 42.4  PLT 187 257 249 223    COAGS:  Recent Labs  12/06/15 1607 02/05/16 0729  INR 1.0 0.88  APTT  --  31    BMP:  Recent Labs  11/12/15 1215 11/13/15 0349  11/14/15 0503 12/06/15 1606 01/20/16 0848  NA 138 136 134* 141 142  K 4.6 4.2 4.9 4.2 4.4  CL 102 103 100*  --   --   CO2 _0 GLUCOSE 123* 167* 195* 92 113  BUN _1 7.4 10.6  CALCIUM 9.4 8.1* 8.6* 9.6 9.4  CREATININE 1.06 0.95 1.05 0.8 0.8  GFRNONAA >60 >60 >60  --   --   GFRAA >60 >60 >60  --   --     LIVER FUNCTION TESTS:  Recent Labs  11/12/15 1215 12/06/15 1606 01/20/16 0848  BILITOT 0.7 0.37 0.34  AST _2 ALT _3 ALKPHOS 83 124 132  PROT 7.1 7.6 7.5  ALBUMIN 3.8 3.6 3.7    TUMOR MARKERS: No results for input(s): AFPTM, CEA, CA199, CHROMGRNA in the last 8760 hours.  Assessment and Plan: Patient with history of appendicitis found to have retroperitoneal mass at time of surgery has since started evaluation for possible lymphoma.  IR consulted  for bone marrow biopsy at the request of Dr. Marin Olp for further assessment of the extent of his disease.  Patient has been NPO.  He is not on any blood thinners.  Risks and benefits discussed with the patient including, but not limited to bleeding, infection, damage to adjacent structures or low yield requiring additional tests. All of the patient's questions were answered, patient is agreeable to proceed. Consent signed and in chart.   Thank you for this interesting consult.  I greatly enjoyed meeting James [facility] and look forward to participating in their care.  A copy of this report was sent to the requesting provider on this date.  Electronically Signed: Docia Barrier 02/05/2016, 8:43 AM   I spent a total of  30 Minutes  in face to face in clinical consultation, greater than 50% of which was counseling/coordinating care for lymphoma.

## 2016-02-07 ENCOUNTER — Ambulatory Visit (HOSPITAL_BASED_OUTPATIENT_CLINIC_OR_DEPARTMENT_OTHER): Payer: 59 | Admitting: Hematology & Oncology

## 2016-02-07 ENCOUNTER — Other Ambulatory Visit (HOSPITAL_BASED_OUTPATIENT_CLINIC_OR_DEPARTMENT_OTHER): Payer: 59

## 2016-02-07 VITALS — BP 140/78 | HR 67 | Temp 97.9°F | Resp 16 | Wt 240.0 lb

## 2016-02-07 DIAGNOSIS — C8223 Follicular lymphoma grade III, unspecified, intra-abdominal lymph nodes: Secondary | ICD-10-CM

## 2016-02-07 LAB — CBC WITH DIFFERENTIAL (CANCER CENTER ONLY)
BASO#: 0 10*3/uL (ref 0.0–0.2)
BASO%: 0.3 % (ref 0.0–2.0)
EOS ABS: 0.3 10*3/uL (ref 0.0–0.5)
EOS%: 4.6 % (ref 0.0–7.0)
HEMATOCRIT: 42.8 % (ref 38.7–49.9)
HGB: 14.6 g/dL (ref 13.0–17.1)
LYMPH#: 2.4 10*3/uL (ref 0.9–3.3)
LYMPH%: 32.1 % (ref 14.0–48.0)
MCH: 30.7 pg (ref 28.0–33.4)
MCHC: 34.1 g/dL (ref 32.0–35.9)
MCV: 90 fL (ref 82–98)
MONO#: 0.6 10*3/uL (ref 0.1–0.9)
MONO%: 8.5 % (ref 0.0–13.0)
NEUT#: 4 10*3/uL (ref 1.5–6.5)
NEUT%: 54.5 % (ref 40.0–80.0)
PLATELETS: 229 10*3/uL (ref 145–400)
RBC: 4.75 10*6/uL (ref 4.20–5.70)
RDW: 12.9 % (ref 11.1–15.7)
WBC: 7.4 10*3/uL (ref 4.0–10.0)

## 2016-02-07 LAB — COMPREHENSIVE METABOLIC PANEL
ALBUMIN: 3.9 g/dL (ref 3.5–5.0)
ALK PHOS: 127 U/L (ref 40–150)
ALT: 16 U/L (ref 0–55)
ANION GAP: 9 meq/L (ref 3–11)
AST: 17 U/L (ref 5–34)
BILIRUBIN TOTAL: 0.42 mg/dL (ref 0.20–1.20)
BUN: 10.9 mg/dL (ref 7.0–26.0)
CALCIUM: 9.6 mg/dL (ref 8.4–10.4)
CHLORIDE: 105 meq/L (ref 98–109)
CO2: 27 mEq/L (ref 22–29)
Creatinine: 0.9 mg/dL (ref 0.7–1.3)
EGFR: >90 mL/min/{1.73_m2} (ref >90)
Glucose: 82 mg/dl (ref 70–140)
Potassium: 4.3 mEq/L (ref 3.5–5.1)
Sodium: 141 mEq/L (ref 136–145)
Total Protein: 7.4 g/dL (ref 6.4–8.3)

## 2016-02-07 LAB — TISSUE HYBRIDIZATION TO NCBH

## 2016-02-07 LAB — LACTATE DEHYDROGENASE: LDH: 166 U/L (ref 125–245)

## 2016-02-07 MED ORDER — FAMCICLOVIR 500 MG PO TABS: 500.0000 mg | ORAL_TABLET | Freq: Every day | ORAL | 12 refills | Status: DC

## 2016-02-07 NOTE — Progress Notes (Signed)
START ON PATHWAY REGIMEN - Lymphoma and CLL  LYOS275: Bendamustine + Rituximab (90/375) q28 Days x 6 Cycles   A cycle is every 28 days:     Bendamustine (Bendeka(TM)) 90 mg/m2 in 50 mL NS IV over 10 minutes days 1 and 2. Administer first Dose Mod: None     Rituximab (Rituxan(R)) 375 mg/m2 in _____ mL NS IV day 1 only.  Initiate first dose at a rate of 50 mg/hr.  In the absence of infusion toxicity, increase infusion rate by 50 mg/hr increments every 30 minutes, to a maximum of 400 mg/hr. If first dose  tolerated, may initiate subsequent infusions at an initial rate of 100 mg/hr.  In the absence of infusion toxicity, may increase rate by 100 mg/hr increments at 30-min intervals to a maximum of 400 mg/hr. For follicular and DLBC lymphoma patients see  "Rapid infusion protocol" link for more information about accelerating the infusion time of rituximab Dose Mod: None Additional Orders: Bendamustine use not recommended in patients with CrCl < 40 mL/min or with moderate (AST/ALT 2.5-10 xULN and total bili 1.5-3 xULN) or severe (total bili > 3 xULN) hepatic impairment. Hepatitis B&C testing recommended prior to  rituximab use on all patients. Final concentration of rituximab must be between 1 and 4 mg/mL.  **Always confirm dose/schedule in your pharmacy ordering system**    Patient Characteristics: Follicular, Grades 1, 2, and 3A, First Line, Stage III / IV, Symptomatic or Bulky Disease Disease Type: Follicular Disease Type: Not Applicable Grade: Grades 1, 2, and 3A [place] Stage: IIIAE Line of therapy: First Line Disease Characteristics: Symptomatic or Bulky Disease  Intent of Therapy: Curative Intent, Discussed with Patient

## 2016-02-07 NOTE — Progress Notes (Signed)
Hematology and Oncology Follow Up Visit  James [facility] [id number] Oct 01, 1970 46 y.o. 02/07/2016   Principle Diagnosis:   Follicular large cell non-Hodgkin's lymphoma (FLIPI-2 = 0)  Current Therapy:    Rituxan/bendamustine-start on 02/13/2016     Interim History:  James [facility] is back for follow-up. We do have a diagnosis now. He underwent lymph node biopsy on December 18. He had an excisional biopsy done. Dr. Redmond Pulling did a very good job. The pathology report ([id number]) showed a non-Hodgkin's lymphoma that appear to be consistent with a large cell follicular lymphoma. This is a B cell lymphoma as it was CD20 positive.  He did have a bone marrow biopsy done. This was done on January 3. The pathology report ([id number]) showed a normal bone marrow without evidence of lymphoma.  By his FLIPI-2 Score of 0, he is in a very good prognostic group.  He now comes in with his wife to talk about chemotherapy.  Of note, when he had his PET scan done in November, this showed the hypermetabolic mass in the retroperitoneum that measured 4.6 x 2.4 cm. Also noted was the right peritoneal sidewall mass. He had the inguinal lymph node. As such, I think his disease is pretty much confined to his abdomen.  He has a very good performance status. His ECOG status is 0.  His appetite is good. He's had a problem with bowels or bladder. He's had no cough or shortness of breath. He's had no rashes.  Medications:  Current Outpatient Prescriptions:  .  cetirizine (ZYRTEC) 10 MG tablet, Take 10 mg by mouth daily as needed for allergies., Disp: , Rfl:  .  Ibuprofen 200 MG CAPS, Take 400 mg by mouth at bedtime as needed., Disp: , Rfl:  .  multivitamin (ONE-A-DAY MEN'S) TABS tablet, Take 1 tablet by mouth daily., Disp: , Rfl:  .  oxyCODONE (OXY IR/ROXICODONE) 5 MG immediate release tablet, Take 1-2 tablets (5-10 mg total) by mouth every 6 (six) hours as needed for moderate pain, severe pain or breakthrough pain., Disp: 15  tablet, Rfl: 0 .  famciclovir (FAMVIR) 500 MG tablet, Take 1 tablet (500 mg total) by mouth daily., Disp: 30 tablet, Rfl: 12  Allergies:  Allergies  Allergen Reactions  . Shellfish Allergy Anaphylaxis  . Lactose Intolerance (Gi) Diarrhea  . Mango Flavor Swelling    MANGO(S) LIPS SWELL    Past Medical History, Surgical history, Social history, and Family History were reviewed and updated.  Review of Systems:  As above  Physical Exam:  weight is 240 lb (108.9 kg). His oral temperature is 97.9 F (36.6 C). His blood pressure is 140/78 and his pulse is 67. His respiration is 16 and oxygen saturation is 100%.   Wt Readings from Last 3 Encounters:  02/07/16 240 lb (108.9 kg)  02/05/16 234 lb (106.1 kg)  01/20/16 236 lb (107 kg)      Well-developed and well-nourished white male in no obvious distress. Head and neck exam shows no ocular or oral lesions. He has no palpable cervical or supraclavicular lymph nodes. Lungs are clear bilaterally. Cardiac exam regular rate and rhythm with no murmurs, rubs or bruits. Abdomen is soft. He has good bowel sounds. Has the laparoscopic scar from his recent lymph node biopsy and appendectomy. There is no palpable liver or spleen tip. Back exam shows no tenderness over the spine, ribs or hips. Extremities shows no clubbing, cyanosis or edema. He has good range of motion of his joints. Skin exam shows no  rashes, ecchymosis or petechia. Neurological exam shows no focal neurological deficits.  Lab Results  Component Value Date   WBC 7.4 02/07/2016   HGB 14.6 02/07/2016   HCT 42.8 02/07/2016   MCV 90 02/07/2016   PLT 229 02/07/2016     Chemistry      Component Value Date/Time   NA 142 01/20/2016 0848   K 4.4 01/20/2016 0848   CL 100 (L) 11/14/2015 0503   CO2 26 01/20/2016 0848   BUN 10.6 01/20/2016 0848   CREATININE 0.8 01/20/2016 0848      Component Value Date/Time   CALCIUM 9.4 01/20/2016 0848   ALKPHOS 132 01/20/2016 0848   AST 17  01/20/2016 0848   ALT 19 01/20/2016 0848   BILITOT 0.34 01/20/2016 0848         Impression and Plan: James [facility] is A 46 year old white male. He has a follicular large cell non-Hodgkin's lymphoma. He has a very good FLIPI-2 score.  I think he will be a very good candidate for systemic chemotherapy. I would utilize Rituxan/bendamustine. I believe this would be considered standard of care for front-line treatment for follicular non-Hodgkin's lymphoma.  He is going to try to go without a Port-A-Cath. He does not have the best peripheral access. However, we will try to treat him without a Port-A-Cath.  I gave him his wife information sheets about the medications. I went over the side effects. I will call in some Famvir for him.  He agrees to start therapy. We will start next week.  Isolated him the risk of fatigue, reaction to the Rituxan. I told him that the Rituxan reaction show only happen with the first infusion. I told him that the first infusion takes about 6 hours or so. After that, the next infusions only take about an hour and a half. I suppose we could use the new subcutaneous Rituxan. I'm unsure if our pharmacy carries this. I explained him the possibility of infection, diarrhea, mouth sores, cough, rashes. I think the risk of these happening probably would be less than 10%.  I spent about 50 minutes with he and his wife. I gave him a prayer blanket when I first saw him. He is very thankful for this. I answered all their questions.  They feel very assured as to the outcome. I would think given his very low FLIPI-2 score, his 3 year survival she easily be above 90%.  I will see him back when he starts his second cycle of treatment. I will then repeat a PET scan after his second cycle of treatment.  [name], MD 1/5/20183:20 PM

## 2016-02-08 LAB — BETA 2 MICROGLOBULIN, SERUM: Beta-2: 1.6 mg/L (ref 0.6–2.4)

## 2016-02-10 ENCOUNTER — Other Ambulatory Visit: Payer: Self-pay | Admitting: *Deleted

## 2016-02-10 DIAGNOSIS — C8223 Follicular lymphoma grade III, unspecified, intra-abdominal lymph nodes: Secondary | ICD-10-CM

## 2016-02-11 ENCOUNTER — Encounter: Payer: Self-pay | Admitting: *Deleted

## 2016-02-11 ENCOUNTER — Other Ambulatory Visit: Payer: 59

## 2016-02-13 ENCOUNTER — Ambulatory Visit (HOSPITAL_BASED_OUTPATIENT_CLINIC_OR_DEPARTMENT_OTHER): Payer: 59

## 2016-02-13 ENCOUNTER — Other Ambulatory Visit: Payer: Self-pay | Admitting: Family

## 2016-02-13 ENCOUNTER — Other Ambulatory Visit: Payer: 59

## 2016-02-13 ENCOUNTER — Other Ambulatory Visit: Payer: Self-pay

## 2016-02-13 ENCOUNTER — Encounter: Payer: Self-pay | Admitting: Hematology & Oncology

## 2016-02-13 VITALS — BP 113/67 | HR 84 | Temp 97.9°F | Resp 18

## 2016-02-13 DIAGNOSIS — C8223 Follicular lymphoma grade III, unspecified, intra-abdominal lymph nodes: Secondary | ICD-10-CM

## 2016-02-13 DIAGNOSIS — Z5112 Encounter for antineoplastic immunotherapy: Secondary | ICD-10-CM

## 2016-02-13 LAB — CHROMOSOME ANALYSIS, BONE MARROW

## 2016-02-13 MED ORDER — LORAZEPAM 0.5 MG PO TABS: 0.5000 mg | ORAL_TABLET | Freq: Four times a day (QID) | ORAL | 0 refills | Status: DC | PRN

## 2016-02-13 MED ORDER — DEXAMETHASONE SODIUM PHOSPHATE 10 MG/ML IJ SOLN
INTRAMUSCULAR | Status: AC
  Filled 2016-02-13: qty 1

## 2016-02-13 MED ORDER — FAMOTIDINE IN NACL 20-0.9 MG/50ML-% IV SOLN
20.0000 mg | Freq: Once | INTRAVENOUS | Status: AC | PRN
  Administered 2016-02-13: 40 mg via INTRAVENOUS

## 2016-02-13 MED ORDER — SODIUM CHLORIDE 0.9% FLUSH
10.0000 mL | INTRAVENOUS | Status: DC | PRN
  Filled 2016-02-13: qty 10

## 2016-02-13 MED ORDER — LORATADINE 10 MG PO TABS
10.0000 mg | ORAL_TABLET | Freq: Once | ORAL | Status: AC
  Administered 2016-02-13: 10 mg via ORAL
  Filled 2016-02-13: qty 1

## 2016-02-13 MED ORDER — DIPHENHYDRAMINE HCL 25 MG PO CAPS
50.0000 mg | ORAL_CAPSULE | Freq: Once | ORAL | Status: AC
  Administered 2016-02-13: 50 mg via ORAL

## 2016-02-13 MED ORDER — ACYCLOVIR 400 MG PO TABS: 400.0000 mg | ORAL_TABLET | Freq: Every day | ORAL | 3 refills | Status: DC

## 2016-02-13 MED ORDER — DIPHENHYDRAMINE HCL 50 MG/ML IJ SOLN
50.0000 mg | Freq: Once | INTRAMUSCULAR | Status: AC | PRN
  Administered 2016-02-13: 25 mg via INTRAVENOUS

## 2016-02-13 MED ORDER — ONDANSETRON HCL 8 MG PO TABS: 8.0000 mg | ORAL_TABLET | Freq: Two times a day (BID) | ORAL | 1 refills | Status: DC | PRN

## 2016-02-13 MED ORDER — SODIUM CHLORIDE 0.9 % IV SOLN
375.0000 mg/m2 | Freq: Once | INTRAVENOUS | Status: AC
  Administered 2016-02-13: 900 mg via INTRAVENOUS
  Filled 2016-02-13: qty 50

## 2016-02-13 MED ORDER — FAMOTIDINE IN NACL 20-0.9 MG/50ML-% IV SOLN
INTRAVENOUS | Status: AC
  Filled 2016-02-13: qty 50

## 2016-02-13 MED ORDER — METHYLPREDNISOLONE SODIUM SUCC 125 MG IJ SOLR
125.0000 mg | Freq: Once | INTRAMUSCULAR | Status: AC | PRN
  Administered 2016-02-13: 125 mg via INTRAVENOUS

## 2016-02-13 MED ORDER — SODIUM CHLORIDE 0.9 % IV SOLN
90.0000 mg/m2 | Freq: Once | INTRAVENOUS | Status: AC
  Administered 2016-02-13: 200 mg via INTRAVENOUS
  Filled 2016-02-13: qty 4

## 2016-02-13 MED ORDER — DEXAMETHASONE SODIUM PHOSPHATE 10 MG/ML IJ SOLN
10.0000 mg | Freq: Once | INTRAMUSCULAR | Status: AC
  Administered 2016-02-13: 10 mg via INTRAVENOUS

## 2016-02-13 MED ORDER — PALONOSETRON HCL INJECTION 0.25 MG/5ML
0.2500 mg | Freq: Once | INTRAVENOUS | Status: AC
  Administered 2016-02-13: 0.25 mg via INTRAVENOUS

## 2016-02-13 MED ORDER — SODIUM CHLORIDE 0.9 % IV SOLN
Freq: Once | INTRAVENOUS | Status: AC
  Administered 2016-02-13: 09:00:00 via INTRAVENOUS

## 2016-02-13 MED ORDER — PALONOSETRON HCL INJECTION 0.25 MG/5ML
INTRAVENOUS | Status: AC
  Filled 2016-02-13: qty 5

## 2016-02-13 MED ORDER — DIPHENHYDRAMINE HCL 25 MG PO CAPS
ORAL_CAPSULE | ORAL | Status: AC
  Filled 2016-02-13: qty 2

## 2016-02-13 MED ORDER — PROCHLORPERAZINE MALEATE 10 MG PO TABS: 10.0000 mg | ORAL_TABLET | Freq: Four times a day (QID) | ORAL | 1 refills | Status: DC | PRN

## 2016-02-13 MED ORDER — DEXAMETHASONE 4 MG PO TABS: 8.0000 mg | ORAL_TABLET | Freq: Every day | ORAL | 1 refills | Status: DC

## 2016-02-13 MED ORDER — ACETAMINOPHEN 325 MG PO TABS
ORAL_TABLET | ORAL | Status: AC
  Filled 2016-02-13: qty 2

## 2016-02-13 MED ORDER — HEPARIN SOD (PORK) LOCK FLUSH 100 UNIT/ML IV SOLN
500.0000 [IU] | Freq: Once | INTRAVENOUS | Status: DC | PRN
  Filled 2016-02-13: qty 5

## 2016-02-13 MED ORDER — HEPARIN SOD (PORK) LOCK FLUSH 100 UNIT/ML IV SOLN
250.0000 [IU] | Freq: Once | INTRAVENOUS | Status: DC | PRN
  Filled 2016-02-13: qty 5

## 2016-02-13 MED ORDER — ALTEPLASE 2 MG IJ SOLR
2.0000 mg | Freq: Once | INTRAMUSCULAR | Status: DC | PRN
  Filled 2016-02-13: qty 2

## 2016-02-13 MED ORDER — SODIUM CHLORIDE 0.9% FLUSH
3.0000 mL | INTRAVENOUS | Status: DC | PRN
  Filled 2016-02-13: qty 10

## 2016-02-13 MED ORDER — ACETAMINOPHEN 325 MG PO TABS
650.0000 mg | ORAL_TABLET | Freq: Once | ORAL | Status: AC
  Administered 2016-02-13: 650 mg via ORAL

## 2016-02-13 MED ORDER — ALLOPURINOL 300 MG PO TABS: 300.0000 mg | ORAL_TABLET | Freq: Every day | ORAL | 3 refills | Status: DC

## 2016-02-13 MED ORDER — SODIUM CHLORIDE 0.9 % IV SOLN: 10.0000 mg | Freq: Once | INTRAVENOUS | Status: DC

## 2016-02-13 NOTE — Patient Instructions (Addendum)
Bendamustine Injection What is this medicine? BENDAMUSTINE (BEN da MUS teen) is a chemotherapy drug. It is used to treat chronic lymphocytic leukemia and non-Hodgkin lymphoma. COMMON BRAND NAME(S): BENDEKA, Treanda What should I tell my health care provider before I take this medicine? They need to know if you have any of these conditions: -infection (especially a virus infection such as chickenpox, cold sores, or herpes) -kidney disease -liver disease -an unusual or allergic reaction to bendamustine, mannitol, other medicines, foods, dyes, or preservatives -pregnant or trying to get pregnant -breast-feeding How should I use this medicine? This medicine is for infusion into a vein. It is given by a health care professional in a hospital or clinic setting. Talk to your pediatrician regarding the use of this medicine in children. Special care may be needed. What if I miss a dose? It is important not to miss your dose. Call your doctor or health care professional if you are unable to keep an appointment. What may interact with this medicine? Do not take this medicine with any of the following medications: -clozapine This medicine may also interact with the following medications: -atazanavir -cimetidine -ciprofloxacin -enoxacin -fluvoxamine -medicines for seizures like carbamazepine and phenobarbital -mexiletine -rifampin -tacrine -thiabendazole -zileuton What should I watch for while using this medicine? This drug may make you feel generally unwell. This is not uncommon, as chemotherapy can affect healthy cells as well as cancer cells. Report any side effects. Continue your course of treatment even though you feel ill unless your doctor tells you to stop. You may need blood work done while you are taking this medicine. Call your doctor or health care professional for advice if you get a fever, chills or sore throat, or other symptoms of a cold or flu. Do not treat yourself. This drug  decreases your body's ability to fight infections. Try to avoid being around people who are sick. This medicine may increase your risk to bruise or bleed. Call your doctor or health care professional if you notice any unusual bleeding. Talk to your doctor about your risk of cancer. You may be more at risk for certain types of cancers if you take this medicine. Do not become pregnant while taking this medicine or for 3 months after stopping it. Women should inform their doctor if they wish to become pregnant or think they might be pregnant. Men should not father a child while taking this medicine and for 3 months after stopping it.There is a potential for serious side effects to an unborn child. Talk to your health care professional or pharmacist for more information. Do not breast-feed an infant while taking this medicine. This medicine may interfere with the ability to have a child. You should talk with your doctor or health care professional if you are concerned about your fertility. What side effects may I notice from receiving this medicine? Side effects that you should report to your doctor or health care professional as soon as possible: -allergic reactions like skin rash, itching or hives, swelling of the face, lips, or tongue -low blood counts - this medicine may decrease the number of white blood cells, red blood cells and platelets. You may be at increased risk for infections and bleeding. -redness, blistering, peeling or loosening of the skin, including inside the mouth -signs of infection - fever or chills, cough, sore throat, pain or difficulty passing urine -signs of decreased platelets or bleeding - bruising, pinpoint red spots on the skin, black, tarry stools, blood in the urine -signs  of decreased red blood cells - unusually weak or tired, fainting spells, lightheadedness -signs and symptoms of kidney injury like trouble passing urine or change in the amount of urine -signs and  symptoms of liver injury like dark yellow or brown urine; general ill feeling or flu-like symptoms; light-colored stools; loss of appetite; nausea; right upper belly pain; unusually weak or tired; yellowing of the eyes or skin Side effects that usually do not require medical attention (report to your doctor or health care professional if they continue or are bothersome): -constipation -decreased appetite -diarrhea -headache -mouth sores -nausea/vomiting -tiredness Where should I keep my medicine? This drug is given in a hospital or clinic and will not be stored at home.  2017 Elsevier/Gold Standard (2014-11-22 08:45:41) Rituximab injection What is this medicine? RITUXIMAB (ri TUX i mab) is a monoclonal antibody. It is used to treat non-Hodgkin lymphoma and chronic lymphocytic leukemia. It is also used to treat rheumatoid arthritis (RA). In RA, this medicine slows the inflammatory process and help reduce joint pain and swelling. This medicine is often used with other cancer or arthritis medications. This medicine may be used for other purposes; ask your health care provider or pharmacist if you have questions. COMMON BRAND NAME(S): Rituxan What should I tell my health care provider before I take this medicine? They need to know if you have any of these conditions: -heart disease -infection (especially a virus infection such as hepatitis B, chickenpox, cold sores, or herpes) -immune system problems -irregular heartbeat -kidney disease -lung or breathing disease, like asthma -recently received or scheduled to receive a vaccine -an unusual or allergic reaction to rituximab, mouse proteins, other medicines, foods, dyes, or preservatives -pregnant or trying to get pregnant -breast-feeding How should I use this medicine? This medicine is for infusion into a vein. It is administered in a hospital or clinic by a specially trained health care professional. A special MedGuide will be given to  you by the pharmacist with each prescription and refill. Be sure to read this information carefully each time. Talk to your pediatrician regarding the use of this medicine in children. This medicine is not approved for use in children. Overdosage: If you think you have taken too much of this medicine contact a poison control center or emergency room at once. NOTE: This medicine is only for you. Do not share this medicine with others. What if I miss a dose? It is important not to miss a dose. Call your doctor or health care professional if you are unable to keep an appointment. What may interact with this medicine? -cisplatin -medicines for blood pressure -some other medicines for arthritis -vaccines This list may not describe all possible interactions. Give your health care provider a list of all the medicines, herbs, non-prescription drugs, or dietary supplements you use. Also tell them if you smoke, drink alcohol, or use illegal drugs. Some items may interact with your medicine. What should I watch for while using this medicine? Report any side effects that you notice during your treatment right away, such as changes in your breathing, fever, chills, dizziness or lightheadedness. These effects are more common with the first dose. Visit your prescriber or health care professional for checks on your progress. You will need to have regular blood work. Report any other side effects. The side effects of this medicine can continue after you finish your treatment. Continue your course of treatment even though you feel ill unless your doctor tells you to stop. Call your doctor or  health care professional for advice if you get a fever, chills or sore throat, or other symptoms of a cold or flu. Do not treat yourself. This drug decreases your body's ability to fight infections. Try to avoid being around people who are sick. This medicine may increase your risk to bruise or bleed. Call your doctor or health  care professional if you notice any unusual bleeding. Be careful brushing and flossing your teeth or using a toothpick because you may get an infection or bleed more easily. If you have any dental work done, tell your dentist you are receiving this medicine. Avoid taking products that contain aspirin, acetaminophen, ibuprofen, naproxen, or ketoprofen unless instructed by your doctor. These medicines may hide a fever. Do not become pregnant while taking this medicine. Women should inform their doctor if they wish to become pregnant or think they might be pregnant. There is a potential for serious side effects to an unborn child. Talk to your health care professional or pharmacist for more information. Do not breast-feed an infant while taking this medicine. What side effects may I notice from receiving this medicine? Side effects that you should report to your doctor or health care professional as soon as possible: -allergic reactions like skin rash, itching or hives, swelling of the face, lips, or tongue -low blood counts - this medicine may decrease the number of white blood cells, red blood cells and platelets. You may be at increased risk for infections and bleeding. -signs of infection - fever or chills, cough, sore throat, pain or difficulty passing urine -signs of decreased platelets or bleeding - bruising, pinpoint red spots on the skin, black, tarry stools, blood in the urine -signs of decreased red blood cells - unusually weak or tired, fainting spells, lightheadedness -breathing problems -confused, not responsive -chest pain -fast, irregular heartbeat -feeling faint or lightheaded, falls -mouth sores -redness, blistering, peeling or loosening of the skin, including inside the mouth -stomach pain -swelling of the ankles, feet, or hands -trouble passing urine or change in the amount of urine Side effects that usually do not require medical attention (report to your doctor or health care  professional if they continue or are bothersome): -anxiety -headache -loss of appetite -muscle aches -nausea -night sweats This list may not describe all possible side effects. Call your doctor for medical advice about side effects. You may report side effects to FDA at 1-800-FDA-1088. Where should I keep my medicine? This drug is given in a hospital or clinic and will not be stored at home. NOTE: This sheet is a summary. It may not cover all possible information. If you have questions about this medicine, talk to your doctor, pharmacist, or health care provider.  2017 Elsevier/Gold Standard (2015-08-01 17:23:26) [facility] Discharge Instructions for Patients Receiving Chemotherapy  Today you received the following chemotherapy agents Rituxan, Bendeka.  To help prevent nausea and vomiting after your treatment, we encourage you to take your nausea medication  Zofran: Saturday - Monday Decadron: Saturday & Sunday Compazine & Ativan: As needed.   If you develop nausea and vomiting that is not controlled by your nausea medication, call the clinic.   BELOW ARE SYMPTOMS THAT SHOULD BE REPORTED IMMEDIATELY:  *FEVER GREATER THAN 100.5 F  *CHILLS WITH OR WITHOUT FEVER  NAUSEA AND VOMITING THAT IS NOT CONTROLLED WITH YOUR NAUSEA MEDICATION  *UNUSUAL SHORTNESS OF BREATH  *UNUSUAL BRUISING OR BLEEDING  TENDERNESS IN MOUTH AND THROAT WITH OR WITHOUT PRESENCE OF ULCERS  *URINARY PROBLEMS  *  BOWEL PROBLEMS  UNUSUAL RASH Items with * indicate a potential emergency and should be followed up as soon as possible.  Feel free to call the clinic you have any questions or concerns. The clinic phone number is [phone].  Please show the [place] at check-in to the Emergency Department and triage nurse.

## 2016-02-14 ENCOUNTER — Other Ambulatory Visit: Payer: Self-pay | Admitting: *Deleted

## 2016-02-14 ENCOUNTER — Ambulatory Visit (HOSPITAL_BASED_OUTPATIENT_CLINIC_OR_DEPARTMENT_OTHER): Payer: 59

## 2016-02-14 ENCOUNTER — Other Ambulatory Visit: Payer: Self-pay | Admitting: Hematology & Oncology

## 2016-02-14 VITALS — BP 118/73 | HR 98 | Temp 98.0°F | Resp 18

## 2016-02-14 DIAGNOSIS — C8293 Follicular lymphoma, unspecified, intra-abdominal lymph nodes: Secondary | ICD-10-CM

## 2016-02-14 DIAGNOSIS — Z5111 Encounter for antineoplastic chemotherapy: Secondary | ICD-10-CM

## 2016-02-14 DIAGNOSIS — C8223 Follicular lymphoma grade III, unspecified, intra-abdominal lymph nodes: Secondary | ICD-10-CM

## 2016-02-14 LAB — HEPATITIS B CORE ANTIBODY, TOTAL: Hep B Core Ab, Tot: NEGATIVE

## 2016-02-14 LAB — HEPATITIS B SURFACE ANTIGEN: HBsAg Screen: NEGATIVE

## 2016-02-14 LAB — HEPATITIS B SURFACE ANTIBODY,QUALITATIVE: Hep B Surface Ab, Qual: NONREACTIVE

## 2016-02-14 MED ORDER — PROCHLORPERAZINE MALEATE 10 MG PO TABS
ORAL_TABLET | ORAL | Status: AC
  Filled 2016-02-14: qty 1

## 2016-02-14 MED ORDER — PROCHLORPERAZINE MALEATE 10 MG PO TABS
10.0000 mg | ORAL_TABLET | Freq: Once | ORAL | Status: AC
  Administered 2016-02-14: 10 mg via ORAL

## 2016-02-14 MED ORDER — SODIUM CHLORIDE 0.9 % IV SOLN
Freq: Once | INTRAVENOUS | Status: AC
  Administered 2016-02-14: 09:00:00 via INTRAVENOUS

## 2016-02-14 MED ORDER — ONDANSETRON HCL 8 MG PO TABS: ORAL_TABLET | ORAL | 1 refills | Status: DC

## 2016-02-14 MED ORDER — SODIUM CHLORIDE 0.9 % IV SOLN
90.0000 mg/m2 | Freq: Once | INTRAVENOUS | Status: AC
  Administered 2016-02-14: 200 mg via INTRAVENOUS
  Filled 2016-02-14: qty 8

## 2016-02-14 MED ORDER — ONDANSETRON HCL 8 MG PO TABS: 8.0000 mg | ORAL_TABLET | Freq: Two times a day (BID) | ORAL | 1 refills | Status: DC | PRN

## 2016-02-14 NOTE — Patient Instructions (Signed)
[place] Discharge Instructions for Patients Receiving Chemotherapy  Today you received the following chemotherapy agents Bendeka.  To help prevent nausea and vomiting after your treatment, we encourage you to take your nausea medication as directed. NO ZOFRAN FOR 2 MORE DAYS - TAKE COMPAZINE INSTEAD.   If you develop nausea and vomiting that is not controlled by your nausea medication, call the clinic.   BELOW ARE SYMPTOMS THAT SHOULD BE REPORTED IMMEDIATELY:  *FEVER GREATER THAN 100.5 F  *CHILLS WITH OR WITHOUT FEVER  NAUSEA AND VOMITING THAT IS NOT CONTROLLED WITH YOUR NAUSEA MEDICATION  *UNUSUAL SHORTNESS OF BREATH  *UNUSUAL BRUISING OR BLEEDING  TENDERNESS IN MOUTH AND THROAT WITH OR WITHOUT PRESENCE OF ULCERS  *URINARY PROBLEMS  *BOWEL PROBLEMS  UNUSUAL RASH Items with * indicate a potential emergency and should be followed up as soon as possible.  Feel free to call the clinic you have any questions or concerns. The clinic phone number is [phone].  Please show the [place] at check-in to the Emergency Department and triage nurse.

## 2016-02-14 NOTE — Progress Notes (Signed)
CHEMO FOLLOW UP - done here in clinic when patient returned. He felt well last night and slept well but this morning he has some mild nausea and felt flush with increased pulse. He denies any pain or vomiting. He did not take any home nausea medicine - we reviewed his medications needs and orders together and with spouse.

## 2016-02-14 NOTE — Progress Notes (Signed)
NO more Decadron today per MD Marin Olp

## 2016-02-19 ENCOUNTER — Other Ambulatory Visit: Payer: Self-pay | Admitting: Radiology

## 2016-02-21 ENCOUNTER — Inpatient Hospital Stay (HOSPITAL_COMMUNITY): Admission: RE | Admit: 2016-02-21 | Payer: 59 | Source: Ambulatory Visit

## 2016-02-21 ENCOUNTER — Ambulatory Visit (HOSPITAL_COMMUNITY): Admission: RE | Admit: 2016-02-21 | Payer: 59 | Source: Ambulatory Visit

## 2016-02-24 ENCOUNTER — Telehealth: Payer: Self-pay | Admitting: *Deleted

## 2016-02-24 ENCOUNTER — Other Ambulatory Visit: Payer: Self-pay | Admitting: General Surgery

## 2016-02-24 NOTE — Telephone Encounter (Signed)
Patient c/o rash to his torso, back, shoulders and thighs. He just noticed it last pm. He is 10 days post treatment. He denies any known new exposures.  The rash is red with small round spots. It is raised in some areas. It is not itchy, nor does it hurt. Not warm to the touch.   He hasn't tried anything to treat the rash.  Spoke with Dr Marin Olp. He would like the patient to try OTC Benadryl and an antihistamine. Patient is aware of instructions. Verified with teach back.

## 2016-02-25 ENCOUNTER — Telehealth: Payer: Self-pay | Admitting: *Deleted

## 2016-02-25 ENCOUNTER — Encounter (HOSPITAL_COMMUNITY): Payer: Self-pay

## 2016-02-25 ENCOUNTER — Ambulatory Visit (HOSPITAL_COMMUNITY): Admission: RE | Admit: 2016-02-25 | Payer: 59 | Source: Ambulatory Visit

## 2016-02-25 ENCOUNTER — Other Ambulatory Visit (HOSPITAL_COMMUNITY): Payer: 59

## 2016-02-25 NOTE — Telephone Encounter (Signed)
Patient states has had a fever of 99.7 which seems to go up and down.  Still has rash .  Dr. Marin Olp wants to see rash sometime on Wednesday.  Patient notified of this informaiton.

## 2016-02-26 ENCOUNTER — Encounter (HOSPITAL_BASED_OUTPATIENT_CLINIC_OR_DEPARTMENT_OTHER): Payer: Self-pay

## 2016-02-26 ENCOUNTER — Emergency Department (HOSPITAL_BASED_OUTPATIENT_CLINIC_OR_DEPARTMENT_OTHER): Payer: 59

## 2016-02-26 ENCOUNTER — Ambulatory Visit (HOSPITAL_BASED_OUTPATIENT_CLINIC_OR_DEPARTMENT_OTHER): Payer: 59

## 2016-02-26 ENCOUNTER — Other Ambulatory Visit: Payer: Self-pay

## 2016-02-26 ENCOUNTER — Encounter: Payer: Self-pay | Admitting: Hematology & Oncology

## 2016-02-26 ENCOUNTER — Encounter: Payer: Self-pay | Admitting: Family

## 2016-02-26 ENCOUNTER — Ambulatory Visit (HOSPITAL_BASED_OUTPATIENT_CLINIC_OR_DEPARTMENT_OTHER): Payer: 59 | Admitting: Family

## 2016-02-26 ENCOUNTER — Inpatient Hospital Stay (HOSPITAL_BASED_OUTPATIENT_CLINIC_OR_DEPARTMENT_OTHER)
Admission: EM | Admit: 2016-02-26 | Discharge: 2016-03-01 | DRG: 315 | Disposition: A | Discharge status: Home / Self Care | Payer: 59 | Attending: Family Medicine | Admitting: Family Medicine

## 2016-02-26 ENCOUNTER — Ambulatory Visit: Payer: 59

## 2016-02-26 VITALS — BP 106/66 | HR 128 | Temp 102.0°F | Resp 18

## 2016-02-26 DIAGNOSIS — M2550 Pain in unspecified joint: Secondary | ICD-10-CM | POA: Diagnosis present

## 2016-02-26 DIAGNOSIS — R21 Rash and other nonspecific skin eruption: Secondary | ICD-10-CM

## 2016-02-26 DIAGNOSIS — R6521 Severe sepsis with septic shock: Secondary | ICD-10-CM | POA: Diagnosis not present

## 2016-02-26 DIAGNOSIS — M791 Myalgia: Secondary | ICD-10-CM | POA: Diagnosis present

## 2016-02-26 DIAGNOSIS — E1165 Type 2 diabetes mellitus with hyperglycemia: Secondary | ICD-10-CM | POA: Diagnosis present

## 2016-02-26 DIAGNOSIS — Z91013 Allergy to seafood: Secondary | ICD-10-CM | POA: Diagnosis not present

## 2016-02-26 DIAGNOSIS — I9589 Other hypotension: Secondary | ICD-10-CM | POA: Diagnosis not present

## 2016-02-26 DIAGNOSIS — C8280 Other types of follicular lymphoma, unspecified site: Secondary | ICD-10-CM | POA: Diagnosis not present

## 2016-02-26 DIAGNOSIS — R519 Headache, unspecified: Secondary | ICD-10-CM

## 2016-02-26 DIAGNOSIS — R112 Nausea with vomiting, unspecified: Secondary | ICD-10-CM

## 2016-02-26 DIAGNOSIS — T451X5A Adverse effect of antineoplastic and immunosuppressive drugs, initial encounter: Secondary | ICD-10-CM | POA: Diagnosis present

## 2016-02-26 DIAGNOSIS — R262 Difficulty in walking, not elsewhere classified: Secondary | ICD-10-CM

## 2016-02-26 DIAGNOSIS — E739 Lactose intolerance, unspecified: Secondary | ICD-10-CM | POA: Diagnosis present

## 2016-02-26 DIAGNOSIS — R1310 Dysphagia, unspecified: Secondary | ICD-10-CM | POA: Diagnosis present

## 2016-02-26 DIAGNOSIS — Z87892 Personal history of anaphylaxis: Secondary | ICD-10-CM

## 2016-02-26 DIAGNOSIS — Z8776 Personal history of (corrected) congenital malformations of integument, limbs and musculoskeletal system: Secondary | ICD-10-CM | POA: Diagnosis not present

## 2016-02-26 DIAGNOSIS — Z91018 Allergy to other foods: Secondary | ICD-10-CM | POA: Diagnosis not present

## 2016-02-26 DIAGNOSIS — Z7952 Long term (current) use of systemic steroids: Secondary | ICD-10-CM | POA: Diagnosis not present

## 2016-02-26 DIAGNOSIS — Y92009 Unspecified place in unspecified non-institutional (private) residence as the place of occurrence of the external cause: Secondary | ICD-10-CM | POA: Diagnosis not present

## 2016-02-26 DIAGNOSIS — C8223 Follicular lymphoma grade III, unspecified, intra-abdominal lymph nodes: Secondary | ICD-10-CM

## 2016-02-26 DIAGNOSIS — R509 Fever, unspecified: Secondary | ICD-10-CM | POA: Diagnosis not present

## 2016-02-26 DIAGNOSIS — J029 Acute pharyngitis, unspecified: Secondary | ICD-10-CM | POA: Diagnosis present

## 2016-02-26 DIAGNOSIS — A419 Sepsis, unspecified organism: Secondary | ICD-10-CM | POA: Diagnosis present

## 2016-02-26 DIAGNOSIS — Z79899 Other long term (current) drug therapy: Secondary | ICD-10-CM | POA: Diagnosis not present

## 2016-02-26 DIAGNOSIS — I952 Hypotension due to drugs: Secondary | ICD-10-CM | POA: Diagnosis present

## 2016-02-26 DIAGNOSIS — Z720 Tobacco use: Secondary | ICD-10-CM | POA: Diagnosis present

## 2016-02-26 DIAGNOSIS — M436 Torticollis: Secondary | ICD-10-CM

## 2016-02-26 DIAGNOSIS — B349 Viral infection, unspecified: Secondary | ICD-10-CM | POA: Diagnosis present

## 2016-02-26 DIAGNOSIS — J390 Retropharyngeal and parapharyngeal abscess: Secondary | ICD-10-CM | POA: Diagnosis not present

## 2016-02-26 DIAGNOSIS — R51 Headache: Secondary | ICD-10-CM | POA: Diagnosis not present

## 2016-02-26 DIAGNOSIS — Z792 Long term (current) use of antibiotics: Secondary | ICD-10-CM

## 2016-02-26 DIAGNOSIS — C8590 Non-Hodgkin lymphoma, unspecified, unspecified site: Secondary | ICD-10-CM | POA: Diagnosis not present

## 2016-02-26 DIAGNOSIS — R05 Cough: Secondary | ICD-10-CM | POA: Diagnosis not present

## 2016-02-26 DIAGNOSIS — F17210 Nicotine dependence, cigarettes, uncomplicated: Secondary | ICD-10-CM | POA: Diagnosis present

## 2016-02-26 HISTORY — DX: Follicular lymphoma, unspecified, unspecified site: C82.90

## 2016-02-26 LAB — CMP (CANCER CENTER ONLY)
ALK PHOS: 96 U/L — AB (ref 26–84)
ALT: 29 U/L (ref 10–47)
AST: 26 U/L (ref 11–38)
Albumin: 3.6 g/dL (ref 3.3–5.5)
BUN, Bld: 8 mg/dL (ref 7–22)
CALCIUM: 8.9 mg/dL (ref 8.0–10.3)
CHLORIDE: 101 meq/L (ref 98–108)
CO2: 25 mEq/L (ref 18–33)
Creat: 1.1 mg/dl (ref 0.6–1.2)
GLUCOSE: 141 mg/dL — AB (ref 73–118)
POTASSIUM: 4 meq/L (ref 3.3–4.7)
Sodium: 136 mEq/L (ref 128–145)
TOTAL PROTEIN: 6.8 g/dL (ref 6.4–8.1)
Total Bilirubin: 0.9 mg/dl (ref 0.20–1.60)

## 2016-02-26 LAB — CBC WITH DIFFERENTIAL (CANCER CENTER ONLY)
BASO#: 0 10*3/uL (ref 0.0–0.2)
BASO%: 0.2 % (ref 0.0–2.0)
EOS ABS: 0.1 10*3/uL (ref 0.0–0.5)
EOS%: 0.7 % (ref 0.0–7.0)
HEMATOCRIT: 43.8 % (ref 38.7–49.9)
HGB: 15.4 g/dL (ref 13.0–17.1)
LYMPH#: 0.7 10*3/uL — ABNORMAL LOW (ref 0.9–3.3)
LYMPH%: 6.2 % — AB (ref 14.0–48.0)
MCH: 31 pg (ref 28.0–33.4)
MCHC: 35.2 g/dL (ref 32.0–35.9)
MCV: 88 fL (ref 82–98)
MONO#: 0.6 10*3/uL (ref 0.1–0.9)
MONO%: 5.9 % (ref 0.0–13.0)
NEUT#: 9.3 10*3/uL — ABNORMAL HIGH (ref 1.5–6.5)
NEUT%: 87 % — AB (ref 40.0–80.0)
PLATELETS: 164 10*3/uL (ref 145–400)
RBC: 4.96 10*6/uL (ref 4.20–5.70)
RDW: 13.2 % (ref 11.1–15.7)
WBC: 10.7 10*3/uL — ABNORMAL HIGH (ref 4.0–10.0)

## 2016-02-26 LAB — COMPREHENSIVE METABOLIC PANEL
ALT: 23 U/L (ref 17–63)
AST: 26 U/L (ref 15–41)
Albumin: 3.5 g/dL (ref 3.5–5.0)
Alkaline Phosphatase: 82 U/L (ref 38–126)
Anion gap: 7 (ref 5–15)
BUN: 9 mg/dL (ref 6–20)
CHLORIDE: 99 mmol/L — AB (ref 101–111)
CO2: 25 mmol/L (ref 22–32)
Calcium: 8.4 mg/dL — ABNORMAL LOW (ref 8.9–10.3)
Creatinine, Ser: 1.06 mg/dL (ref 0.61–1.24)
GFR calc Af Amer: >60 mL/min (ref >60)
GFR calc non Af Amer: >60 mL/min (ref >60)
Glucose, Bld: 154 mg/dL — ABNORMAL HIGH (ref 65–99)
POTASSIUM: 3.6 mmol/L (ref 3.5–5.1)
SODIUM: 131 mmol/L — AB (ref 135–145)
Total Bilirubin: 0.7 mg/dL (ref 0.3–1.2)
Total Protein: 6.8 g/dL (ref 6.5–8.1)

## 2016-02-26 LAB — URINALYSIS, ROUTINE W REFLEX MICROSCOPIC
Bilirubin Urine: NEGATIVE
GLUCOSE, UA: NEGATIVE mg/dL
HGB URINE DIPSTICK: NEGATIVE
Ketones, ur: NEGATIVE mg/dL
Leukocytes, UA: NEGATIVE
Nitrite: NEGATIVE
PH: 6 (ref 5.0–8.0)
Protein, ur: NEGATIVE mg/dL
SPECIFIC GRAVITY, URINE: 1.013 (ref 1.005–1.030)

## 2016-02-26 LAB — GLUCOSE, CAPILLARY: GLUCOSE-CAPILLARY: 147 mg/dL — AB (ref 65–99)

## 2016-02-26 LAB — MRSA PCR SCREENING: MRSA by PCR: NEGATIVE

## 2016-02-26 LAB — INFLUENZA PANEL BY PCR (TYPE A & B)
INFLAPCR: NEGATIVE
Influenza B By PCR: NEGATIVE

## 2016-02-26 LAB — TECHNOLOGIST REVIEW CHCC SATELLITE

## 2016-02-26 LAB — I-STAT CG4 LACTIC ACID, ED
LACTIC ACID, VENOUS: 1.41 mmol/L (ref 0.5–1.9)
Lactic Acid, Venous: 0.78 mmol/L (ref 0.5–1.9)

## 2016-02-26 LAB — LACTATE DEHYDROGENASE: LDH: 241 U/L (ref 125–245)

## 2016-02-26 MED ORDER — SODIUM CHLORIDE 0.9 % IV SOLN
INTRAVENOUS | Status: AC
  Administered 2016-02-26: 150 mL/h via INTRAVENOUS

## 2016-02-26 MED ORDER — INSULIN ASPART 100 UNIT/ML ~~LOC~~ SOLN
0.0000 [IU] | SUBCUTANEOUS | Status: DC
  Administered 2016-02-27 (×2): 2 [IU] via SUBCUTANEOUS
  Administered 2016-02-27: 1 [IU] via SUBCUTANEOUS
  Administered 2016-02-27 – 2016-02-28 (×2): 2 [IU] via SUBCUTANEOUS
  Administered 2016-02-28 – 2016-03-01 (×5): 1 [IU] via SUBCUTANEOUS

## 2016-02-26 MED ORDER — VALACYCLOVIR HCL 500 MG PO TABS
1000.0000 mg | ORAL_TABLET | Freq: Every day | ORAL | Status: DC
  Administered 2016-02-27 – 2016-03-01 (×4): 1000 mg via ORAL
  Filled 2016-02-26 (×4): qty 2

## 2016-02-26 MED ORDER — SODIUM CHLORIDE 0.9 % IV BOLUS (SEPSIS)
1000.0000 mL | Freq: Once | INTRAVENOUS | Status: AC
  Administered 2016-02-26: 1000 mL via INTRAVENOUS

## 2016-02-26 MED ORDER — PIPERACILLIN-TAZOBACTAM 3.375 G IVPB
3.3750 g | Freq: Three times a day (TID) | INTRAVENOUS | Status: DC
  Administered 2016-02-27 – 2016-02-28 (×4): 3.375 g via INTRAVENOUS
  Filled 2016-02-26 (×5): qty 50

## 2016-02-26 MED ORDER — SODIUM CHLORIDE 0.9 % IV SOLN: Freq: Once | INTRAVENOUS | Status: DC

## 2016-02-26 MED ORDER — EPINEPHRINE 30 MG/30ML IJ SOLN
INTRAMUSCULAR | Status: AC
  Filled 2016-02-26: qty 1

## 2016-02-26 MED ORDER — NOREPINEPHRINE BITARTRATE 1 MG/ML IV SOLN
0.0000 ug/min | Freq: Once | INTRAVENOUS | Status: AC
  Administered 2016-02-26: 2 ug/min via INTRAVENOUS
  Filled 2016-02-26: qty 4

## 2016-02-26 MED ORDER — VANCOMYCIN HCL IN DEXTROSE 1-5 GM/200ML-% IV SOLN
1000.0000 mg | Freq: Three times a day (TID) | INTRAVENOUS | Status: DC
  Administered 2016-02-27 – 2016-02-28 (×5): 1000 mg via INTRAVENOUS
  Filled 2016-02-26 (×6): qty 200

## 2016-02-26 MED ORDER — IBUPROFEN 800 MG PO TABS
800.0000 mg | ORAL_TABLET | Freq: Once | ORAL | Status: AC
  Administered 2016-02-26: 800 mg via ORAL
  Filled 2016-02-26: qty 1

## 2016-02-26 MED ORDER — ACETAMINOPHEN 500 MG PO TABS
1000.0000 mg | ORAL_TABLET | Freq: Once | ORAL | Status: AC
  Administered 2016-02-26: 1000 mg via ORAL
  Filled 2016-02-26: qty 2

## 2016-02-26 MED ORDER — HEPARIN SODIUM (PORCINE) 5000 UNIT/ML IJ SOLN
5000.0000 [IU] | Freq: Three times a day (TID) | INTRAMUSCULAR | Status: DC
  Administered 2016-02-26 – 2016-03-01 (×11): 5000 [IU] via SUBCUTANEOUS
  Filled 2016-02-26 (×12): qty 1

## 2016-02-26 MED ORDER — EPINEPHRINE PF 1 MG/ML IJ SOLN
INTRAMUSCULAR | Status: AC
  Filled 2016-02-26: qty 4

## 2016-02-26 MED ORDER — VANCOMYCIN HCL IN DEXTROSE 1-5 GM/200ML-% IV SOLN
1000.0000 mg | INTRAVENOUS | Status: AC
  Administered 2016-02-26: 1000 mg via INTRAVENOUS

## 2016-02-26 MED ORDER — DOPAMINE-DEXTROSE 3.2-5 MG/ML-% IV SOLN
2.0000 ug/kg/min | Freq: Once | INTRAVENOUS | Status: AC
  Administered 2016-02-26: 5 ug/kg/min via INTRAVENOUS

## 2016-02-26 MED ORDER — VANCOMYCIN HCL IN DEXTROSE 1-5 GM/200ML-% IV SOLN
1000.0000 mg | Freq: Once | INTRAVENOUS | Status: DC
  Filled 2016-02-26: qty 200

## 2016-02-26 MED ORDER — SODIUM CHLORIDE 0.9 % IV BOLUS (SEPSIS): 1000.0000 mL | Freq: Once | INTRAVENOUS | Status: DC

## 2016-02-26 MED ORDER — OSELTAMIVIR PHOSPHATE 75 MG PO CAPS
75.0000 mg | ORAL_CAPSULE | Freq: Once | ORAL | Status: AC
  Administered 2016-02-26: 75 mg via ORAL
  Filled 2016-02-26: qty 1

## 2016-02-26 MED ORDER — ACETAMINOPHEN 325 MG PO TABS
650.0000 mg | ORAL_TABLET | ORAL | Status: DC | PRN
  Administered 2016-02-27 – 2016-02-29 (×4): 650 mg via ORAL
  Filled 2016-02-26 (×4): qty 2

## 2016-02-26 MED ORDER — ONDANSETRON HCL 4 MG/2ML IJ SOLN: 4.0000 mg | Freq: Four times a day (QID) | INTRAMUSCULAR | Status: DC | PRN

## 2016-02-26 MED ORDER — DOPAMINE-DEXTROSE 3.2-5 MG/ML-% IV SOLN
INTRAVENOUS | Status: AC
  Filled 2016-02-26: qty 250

## 2016-02-26 MED ORDER — FAMOTIDINE IN NACL 20-0.9 MG/50ML-% IV SOLN
20.0000 mg | Freq: Two times a day (BID) | INTRAVENOUS | Status: DC
  Administered 2016-02-26 – 2016-02-27 (×2): 20 mg via INTRAVENOUS
  Filled 2016-02-26 (×2): qty 50

## 2016-02-26 MED ORDER — DEXTROSE 5 % IV SOLN
0.0000 ug/min | INTRAVENOUS | Status: DC
  Administered 2016-02-26: 3 ug/min via INTRAVENOUS
  Filled 2016-02-26: qty 4

## 2016-02-26 MED ORDER — PIPERACILLIN-TAZOBACTAM 3.375 G IVPB 30 MIN
3.3750 g | Freq: Once | INTRAVENOUS | Status: AC
  Administered 2016-02-26: 3.375 g via INTRAVENOUS
  Filled 2016-02-26 (×2): qty 50

## 2016-02-26 MED ORDER — EPINEPHRINE PF 1 MG/ML IJ SOLN
0.5000 ug/min | INTRAVENOUS | Status: DC
  Filled 2016-02-26: qty 4

## 2016-02-26 MED ORDER — SODIUM CHLORIDE 0.9 % IV SOLN
Freq: Once | INTRAVENOUS | Status: AC
  Administered 2016-02-26: 15:00:00 via INTRAVENOUS

## 2016-02-26 MED ORDER — SODIUM CHLORIDE 0.9 % IV SOLN
250.0000 mL | INTRAVENOUS | Status: DC | PRN
  Administered 2016-02-27: 250 mL via INTRAVENOUS

## 2016-02-26 NOTE — Progress Notes (Signed)
Pharmacy Antibiotic Note  James Jennings is a 46 y.o. male admitted on 02/26/2016 with sepsis.  Pharmacy has been consulted for vancomycin and zosyn dosing.  WBC 10.7, ANC 9.3, creat 1.1, lactate 1.41, temp 102.4, wt 109.3 kg. Pt being treated for lymphoma, c/o rash x 4 days, fever x 3 days, vomiting.   Plan: vanc 1000 mg x 2 doses to make 2 gm loading dose Vancomycin 1000 mg IV every 8 hours.  Goal trough 15-20 mcg/mL. Zosyn 3.375g IV q8h (4 hour infusion).  F/u renal fxn, wbc, temp, culture data Vancomycin levels as needed  Height: 5' 11.75" (182.2 cm) Weight: 241 lb (109.3 kg) IBW/kg (Calculated) : 77.03  Temp (24hrs), Avg:102.4 F (39.1 C), Min:102 F (38.9 C), Max:102.8 F (39.3 C)   Recent Labs Lab 02/26/16 1002 02/26/16 1305  WBC 10.7*  --   CREATININE 1.1  --   LATICACIDVEN  --  1.41    Estimated Creatinine Clearance: 105.6 mL/min (by C-G formula based on SCr of 1.1 mg/dL).    Allergies  Allergen Reactions  . Shellfish Allergy Anaphylaxis  . Lactose Intolerance (Gi) Diarrhea  . Mango Flavor Swelling    MANGO(S) LIPS SWELL   Eudelia Bunch, Pharm.D. [id number] 02/26/2016 1:38 PM

## 2016-02-26 NOTE — ED Notes (Signed)
Report given to [facility] 

## 2016-02-26 NOTE — ED Notes (Signed)
Pt c/o "something not right" pt c/o seeing spots and room getting dark, BP 70/40, MD made aware , no new orders

## 2016-02-26 NOTE — ED Notes (Signed)
MD made aware of continuing low BP , 2nd lactic acid drawn

## 2016-02-26 NOTE — ED Notes (Signed)
Report given to [facility] RN ICU

## 2016-02-26 NOTE — Progress Notes (Signed)
eLink Physician-Brief Progress Note Patient Name: James Jennings DOB: [birth year] MRN: [id number]   Date of Service  02/26/2016  HPI/Events of Note  Patient was transferred from [place] center.   He has lymphoma, presenting with hypotension. Received 6 L IV fluids. Is on 150 and most of IV fluids prior to transport.   Being treated as sepsis, unknown source.  Patient seen, comfortable, communicating with the residents, not in distress.   Blood pressure 104/70, heart rate 120, respiratory rate 24, sats 97% on nasal cannula.   eICU Interventions  Cont current management. Cont abx. Cont IVF.      Intervention Category Evaluation Type: New Patient Evaluation  Rush Landmark 02/26/2016, 10:06 PM

## 2016-02-26 NOTE — ED Triage Notes (Addendum)
Pt sent from oncology in the building-being treated for lymphoma-pt c/o rash x 4 days-fever x 3 days-vomited x 1 after drinking water then was able to handle po-NAD-steady gait

## 2016-02-26 NOTE — Progress Notes (Signed)
Hematology and Oncology Follow Up Visit  Khyree Catton [id number] [birth year] 46 y.o. 02/26/2016   Principle Diagnosis:  Follicular large cell non-Hodgkin's lymphoma (FLIPI-2 = 0)  Current Therapy:   Rituxan/bendamustine-start on 02/13/2016    Interim History:  Mr. Moscone is her today with c/o rash that started on Sunday after he showered. Since then he has developed n/v, headache, sensitivity to light/"eyes hurt", neck stiffness and fever. He is dehydrated at this time and admits that he has not been able to stay well hydrated. He is tachycardic with a heart rate of 128. temp is 102.0. His states that his throat is scratchy but not sore.  He has a raised red sand paper rash from head to toe that is not painful or itchy.  He had called the on call service last night and they recommended he go to the ED but he preferred to stay home so they called in Augmentin for him and he has had one dose so far.  He has been taking Tylenol for his fever without much relief. His fever returns once the tylenol wears off.  His wife works at a local high school and their three year old son attends a local day care. No cough, dizziness, SOB, chest pain, palpitations, abdominal pain or changes in bowel or bladder habits. No liver or spleen tip palpated on exam.  He is able to turn his head from side to side and up and down without difficulty but his neck is tender. No swelling, tenderness, numbness or tingling in his extremities. His weight is stable.    Medications:  Allergies as of 02/26/2016      Reactions   Shellfish Allergy Anaphylaxis   Lactose Intolerance (gi) Diarrhea   Mango Flavor Swelling   MANGO(S) LIPS SWELL      Medication List       Accurate as of 02/26/16 10:23 AM. Always use your most recent med list.          cetirizine 10 MG tablet Commonly known as:  ZYRTEC Take 10 mg by mouth daily as needed for allergies.   dexamethasone 4 MG tablet Commonly known as:  DECADRON Take 2 tablets  (8 mg total) by mouth daily. Start the day after bendamustine chemotherapy for 2 days. Take with food.   famciclovir 500 MG tablet Commonly known as:  FAMVIR Take 1 tablet (500 mg total) by mouth daily.   Ibuprofen 200 MG Caps Take 400 mg by mouth at bedtime as needed.   LORazepam 0.5 MG tablet Commonly known as:  ATIVAN Take 1 tablet (0.5 mg total) by mouth every 6 (six) hours as needed (Nausea or vomiting).   multivitamin Tabs tablet Take 1 tablet by mouth daily.   ondansetron 8 MG tablet Commonly known as:  ZOFRAN Start on day 2 after bendamustine chemotherapy.   oxyCODONE 5 MG immediate release tablet Commonly known as:  Oxy IR/ROXICODONE Take 1-2 tablets (5-10 mg total) by mouth every 6 (six) hours as needed for moderate pain, severe pain or breakthrough pain.   prochlorperazine 10 MG tablet Commonly known as:  COMPAZINE Take 1 tablet (10 mg total) by mouth every 6 (six) hours as needed (Nausea or vomiting).       Allergies:  Allergies  Allergen Reactions  . Shellfish Allergy Anaphylaxis  . Lactose Intolerance (Gi) Diarrhea  . Mango Flavor Swelling    MANGO(S) LIPS SWELL    Past Medical History, Surgical history, Social history, and Family History were reviewed and updated.  Review of Systems: All other 10 point review of systems is negative.   Physical Exam:  vitals were not taken for this visit.  Wt Readings from Last 3 Encounters:  02/07/16 240 lb (108.9 kg)  02/05/16 234 lb (106.1 kg)  01/20/16 236 lb (107 kg)    Ocular: Sclerae unicteric, pupils equal, round and reactive to light Ear-nose-throat: Oropharynx clear, dentition fair Lymphatic: No cervical supraclavicular or axillary adenopathy Lungs no rales or rhonchi, good excursion bilaterally Heart regular rate and rhythm, no murmur appreciated Abd soft, nontender, positive bowel sounds, no liver or spleen tip palpated on exam MSK no focal spinal tenderness, no joint edema Neuro: non-focal,  well-oriented, appropriate affect Breasts: Deferred  Lab Results  Component Value Date   WBC 7.4 02/07/2016   HGB 14.6 02/07/2016   HCT 42.8 02/07/2016   MCV 90 02/07/2016   PLT 229 02/07/2016   No results found for: FERRITIN, IRON, TIBC, UIBC, IRONPCTSAT Lab Results  Component Value Date   RETICCTPCT 1.07 12/06/2015   RBC 4.75 02/07/2016   RETICCTABS 48.79 12/06/2015   No results found for: Nils Pyle [facility] Lab Results  Component Value Date   IGGSERUM 865 01/20/2016   IGMSERUM 89 01/20/2016   No results found for: Odetta Pink, SPEI   Chemistry      Component Value Date/Time   NA 141 02/07/2016 1149   K 4.3 02/07/2016 1149   CL 100 (L) 11/14/2015 0503   CO2 27 02/07/2016 1149   BUN 10.9 02/07/2016 1149   CREATININE 0.9 02/07/2016 1149      Component Value Date/Time   CALCIUM 9.6 02/07/2016 1149   ALKPHOS 127 02/07/2016 1149   AST 17 02/07/2016 1149   ALT 16 02/07/2016 1149   BILITOT 0.42 02/07/2016 1149     Impression and Plan: Mr. Griesser is a pleasant 46 yo white male with follicular large cell non-Hodgkin's lymphoma. He received his first cycle of Rituxan/bendamustine 2 weeks ago and tolerated this well. He [place] taking Famvir 500 mg PO daily as prescribed. He is not on Allopurinol.  He has presented today with a progressive head to toe red, raised, sand paper rash that started on Sunday after he showered. He is now fatigued and weak with n/v, headache, sensitivity to light/"eyes hurt", neck stiffness and fever. His HR is 128 and he is febrile with a temp of 102.0. He has had one dose of Augmentin.  He has had mono in the past. He does not have a sore throat and no abdominal pain.  We will go ahead and send him down stairs to the ED for further work up. Both he and his wife are in agreement with the plan.  Dr. Marin Olp will follow-up with the patient in hospital in the morning.   Eliezer Bottom, NP 1/24/201810:23 AM

## 2016-02-26 NOTE — ED Notes (Addendum)
TIME OUT .... Linker MD , right patient , right procedure . NO consent signed for central line procedure, d/t emergent need, pt verbalized understanding of reason for procedure and agreed

## 2016-02-26 NOTE — ED Provider Notes (Signed)
[facility] DEPT [facility] Provider Note   CSN: [id number] Arrival date & time: 02/26/16  1132     History   Chief Complaint Chief Complaint  Patient presents with  . Fever    HPI James Jennings is a 46 y.o. male.  HPI  Pt presenting with c/;o fever. He has hx of lymphoma and is currently undergoing chemotherapy.  Pt had last infusion approx 2 weeks ago.  He has had fever for the past 2 days.  No significant cough or congestion.  No sore throat.  No abdominal pain.  Had one episode of emesis this morning.  Has been able to drink fluids since that time without further vomiting.  No diarrhea.  Pt currently c/o feeling weak, body aches.  He was seen at Dr. Antonieta Pert office upstairs and referred to the ED due to fever.  There are no other associated systemic symptoms, there are no other alleviating or modifying factors.  Pt also developed a red bumpy rash over right flank and now on back, arms, chest.  Not itchy or painful.  This began 2 day ago with fever.  Fever was initially 99, then this morning tmax at home was 101.7.   Past Medical History:  Diagnosis Date  . H/O ventral hernia   . High cholesterol   . PONV (postoperative nausea and vomiting)   . Sleep apnea    "dx'd ~ 2008; never RX'd mask" (11/12/2015)    Patient Active Problem List   Diagnosis Date Noted  . Septic shock ([place]) 02/26/2016  . Follicular lymphoma grade III of intra-abdominal lymph nodes ([place]) 01/30/2016  . Acute appendicitis with rupture 11/13/2015  . Acute appendicitis 11/12/2015  . Tobacco use 06/25/2011  . ACHILLES BURSITIS OR TENDINITIS 10/28/2009    Past Surgical History:  Procedure Laterality Date  . ACHILLES TENDON SURGERY Left ~ 2012  . APPENDECTOMY  11/12/2015   lap appy  . CLUB FOOT RELEASE Bilateral 1971  . HERNIA REPAIR    . INGUINAL LYMPH NODE BIOPSY Right 01/20/2016   Procedure: EXCISIONAL BIOPSY OF RIGHT INGUINAL LYMPH NODE;  Surgeon: Greer Pickerel, MD;  Location: [facility];  Service: General;   Laterality: Right;  . LAPAROSCOPIC APPENDECTOMY N/A 11/12/2015   Procedure: APPENDECTOMY LAPAROSCOPIC;  Surgeon: Greer Pickerel, MD;  Location: [facility];  Service: General;  Laterality: N/A;  . SUTURE REMOVAL Left ~ 2016-2017 X 3   "achilles; under anesthesia"  . VENTRAL HERNIA REPAIR  1994       Home Medications    Prior to Admission medications   Medication Sig Start Date End Date Taking? Authorizing Provider  Amoxicillin-Pot Clavulanate (AUGMENTIN PO) Take by mouth.   Yes Historical Provider, MD  cetirizine (ZYRTEC) 10 MG tablet Take 10 mg by mouth daily as needed for allergies.    Historical Provider, MD  dexamethasone (DECADRON) 4 MG tablet Take 2 tablets (8 mg total) by mouth daily. Start the day after bendamustine chemotherapy for 2 days. Take with food. 02/13/16   [name], MD  famciclovir (FAMVIR) 500 MG tablet Take 1 tablet (500 mg total) by mouth daily. 02/07/16   [name], MD  Ibuprofen 200 MG CAPS Take 400 mg by mouth at bedtime as needed.    Historical Provider, MD  LORazepam (ATIVAN) 0.5 MG tablet Take 1 tablet (0.5 mg total) by mouth every 6 (six) hours as needed (Nausea or vomiting). 02/13/16   [name], MD  multivitamin (ONE-A-DAY MEN'S) TABS tablet Take 1 tablet by mouth daily.  Historical Provider, MD  ondansetron (ZOFRAN) 8 MG tablet Start on day 2 after bendamustine chemotherapy. 02/14/16   [name], MD  oxyCODONE (OXY IR/ROXICODONE) 5 MG immediate release tablet Take 1-2 tablets (5-10 mg total) by mouth every 6 (six) hours as needed for moderate pain, severe pain or breakthrough pain. 01/20/16   Greer Pickerel, MD  prochlorperazine (COMPAZINE) 10 MG tablet Take 1 tablet (10 mg total) by mouth every 6 (six) hours as needed (Nausea or vomiting). Patient not taking: Reported on 02/26/2016 02/13/16   [name], MD    Family History No family history on file.  Social History Social History  Substance Use Topics  . Smoking status: Current Every  Day Smoker    Packs/day: 1.00    Years: 30.00    Types: Cigarettes  . Smokeless tobacco: Former Systems developer    Types: Snuff     Comment: 11/12/2015 "quit using chew in ~ 1997"  . Alcohol use 2.4 oz/week    4 Shots of liquor per week     Comment: occas drink 1-2 times per week      Allergies   Shellfish allergy; Lactose intolerance (gi); and Mango flavor   Review of Systems Review of Systems  ROS reviewed and all otherwise negative except for mentioned in HPI   Physical Exam Updated Vital Signs BP 96/79   Pulse 112   Temp 98.8 F (37.1 C)   Resp 20   Ht 5' 11.75" (1.822 m)   Wt 241 lb (109.3 kg)   SpO2 100%   BMI 32.91 kg/m  Vitals reviewed Physical Exam Physical Examination: General appearance - alert, well appearing, and in no distress Mental status - alert, oriented to person, place, and time Eyes - no conjunctival injection, no scleral icterus Mouth - mucous membranes moist, pharynx normal without lesions Neck - supple, no significant adenopathy Chest - clear to auscultation, no wheezes, rales or rhonchi, symmetric air entry Heart - normal rate, regular rhythm, normal S1, S2, no murmurs, rubs, clicks or gallops Abdomen - soft, nontender, nondistended, no masses or organomegaly, nabs Neurological - alert, oriented, normal speech Extremities - peripheral pulses normal, no pedal edema, no clubbing or cyanosis Skin - normal coloration and turgor, scattered erythematous papular rash- over back, trunk, bilateral arms, thighs, no petechia, no vesicles  ED Treatments / Results  Labs (all labs ordered are listed, but only abnormal results are displayed) Labs Reviewed  COMPREHENSIVE METABOLIC PANEL - Abnormal; Notable for the following:       Result Value   Sodium 131 (*)    Chloride 99 (*)    Glucose, Bld 154 (*)    Calcium 8.4 (*)    All other components within normal limits  CULTURE, BLOOD (ROUTINE X 2)  CULTURE, BLOOD (ROUTINE X 2)  URINE CULTURE  URINALYSIS,  ROUTINE W REFLEX MICROSCOPIC  CBC WITH DIFFERENTIAL/PLATELET  CBC WITH DIFFERENTIAL/PLATELET  INFLUENZA PANEL BY PCR (TYPE A & B)  I-STAT CG4 LACTIC ACID, ED  I-STAT CG4 LACTIC ACID, ED  I-STAT CG4 LACTIC ACID, ED  I-STAT CG4 LACTIC ACID, ED    EKG  EKG Interpretation None     ED ECG REPORT   Date: 02/26/2016  Rate: 124  Rhythm: sinus tachycardia  QRS Axis: normal  Intervals: normal  ST/T Wave abnormalities: normal  Conduction Disutrbances:none  Narrative Interpretation:   Old EKG Reviewed: none available EKG link not available in epic for interpretation into muse    Radiology Dg Chest 2 View  Result Date: 02/26/2016 CLINICAL DATA:  Cough, rash and fever for 3 days.  Lymphoma. EXAM: CHEST  2 VIEW COMPARISON:  None. FINDINGS: Trachea is midline. Heart size normal. Lungs are clear. No pleural fluid. IMPRESSION: No acute findings. Electronically Signed   By: Lorin Picket M.D.   On: 02/26/2016 13:34    Procedures Procedures (including critical care time)  Medications Ordered in ED Medications  vancomycin (VANCOCIN) IVPB 1000 mg/200 mL premix (1,000 mg Intravenous New Bag/Given 02/26/16 1419)  piperacillin-tazobactam (ZOSYN) IVPB 3.375 g (not administered)  vancomycin (VANCOCIN) IVPB 1000 mg/200 mL premix (not administered)  DOPamine (INTROPIN) 3.2-5 MG/ML-% infusion (not administered)  EPINEPHrine (ADRENALIN) 4 mg in dextrose 5 % 250 mL (0.016 mg/mL) infusion (not administered)  EPINEPHrine (ADRENALIN) 1 MG/ML (not administered)  EPINEPHrine (ADRENALIN) 30 MG/30ML injection (not administered)  0.9 %  sodium chloride infusion (not administered)  sodium chloride 0.9 % bolus 1,000 mL (not administered)  ibuprofen (ADVIL,MOTRIN) tablet 800 mg (800 mg Oral Given 02/26/16 1202)  piperacillin-tazobactam (ZOSYN) IVPB 3.375 g (0 g Intravenous Stopped 02/26/16 1412)  sodium chloride 0.9 % bolus 1,000 mL (0 mLs Intravenous Stopped 02/26/16 1418)  sodium chloride 0.9 % bolus 1,000  mL (0 mLs Intravenous Stopped 02/26/16 1418)  0.9 %  sodium chloride infusion ( Intravenous Stopped 02/26/16 1704)  acetaminophen (TYLENOL) tablet 1,000 mg (1,000 mg Oral Given 02/26/16 1504)  sodium chloride 0.9 % bolus 1,000 mL (1,000 mLs Intravenous New Bag/Given 02/26/16 1503)  oseltamivir (TAMIFLU) capsule 75 mg (75 mg Oral Given 02/26/16 1536)  norepinephrine (LEVOPHED) 4 mg in dextrose 5 % 250 mL (0.016 mg/mL) infusion (30 mcg/min Intravenous Rate/Dose Change 02/26/16 1652)  sodium chloride 0.9 % bolus 1,000 mL (0 mLs Intravenous Stopped 02/26/16 1704)  DOPamine (INTROPIN) 800 mg in dextrose 5 % 250 mL (3.2 mg/mL) infusion (0 mcg/kg/min  109.3 kg Intravenous Paused 02/26/16 1610)   CRITICAL CARE Performed by: Threasa Beards Total critical care time: 90 minutes Critical care time was exclusive of separately billable procedures and treating other patients. Critical care was necessary to treat or prevent imminent or life-threatening deterioration. Critical care was time spent personally by me on the following activities: development of treatment plan with patient and/or surrogate as well as nursing, discussions with consultants, evaluation of patient's response to treatment, examination of patient, obtaining history from patient or surrogate, ordering and performing treatments and interventions, ordering and review of laboratory studies, ordering and review of radiographic studies, pulse oximetry and re-evaluation of patient's condition.  CENTRAL LINE Performed by: Threasa Beards Consent: The procedure was performed in an emergent situation. Required items: required blood products, implants, devices, and special equipment available Patient identity confirmed: arm band and provided demographic data Time out: Immediately prior to procedure a "time out" was called to verify the correct patient, procedure, equipment, support staff and site/side marked as required. Indications: vascular  access Anesthesia: local infiltration Local anesthetic: lidocaine 1% with epinephrine Anesthetic total: 3 ml Patient sedated: no Preparation: skin prepped with 2% chlorhexidine Skin prep agent dried: skin prep agent completely dried prior to procedure Sterile barriers: all five maximum sterile barriers used - cap, mask, sterile gown, sterile gloves, and large sterile sheet Hand hygiene: hand hygiene performed prior to central venous catheter insertion  Location details: right femoral vein  Catheter type: triple lumen Catheter size: 8 Fr Pre-procedure: landmarks identified Ultrasound guidance: yes, femoral vein and artery  Successful placement: yes Post-procedure: line sutured and dressing applied Assessment: blood return through all parts, free fluid  flow Patient tolerance: Patient tolerated the procedure well with no immediate complications.  Initial Impression / Assessment and Plan / ED Course  I have reviewed the triage vital signs and the nursing notes.  Pertinent labs & imaging results that were available during my care of the patient were reviewed by me and considered in my medical decision making (see chart for details).    1:47 PM cbc hemolyzed- but CBC from this morning at oncology office shows WBC 10.7- the remainder is in epic for review- no other significant abnormalities.    3:40 PM pt has had 30cc/kg bolus- fever is down, HR is down to 110s, blood pressure has decreased, but lactate is improved, pt is awake, alert- not symptomatic from blood pressure.  Calling to arrange for admission- will need step down bed.   3:53 PM on another recheck patient is more hypotensive, SBP in 70s- he is now feeling lightheaded- will start norepi drip and call ICU  5:04 PM have called multiple hospitals and there are no ICU beds avaialbe at [facility], [facility], [facility] or [facility].  I have talked with Dr. Nelda Marseille twice with critical care- he may be able to have patient come to the ED to  wait for a bed- would prefer [facility].  Requests that we check with [facility].  We have a call in to [facility] and are waiting for them to check their beds.  Pt most recent BP is systolic [id number]- weaning norepi now.  Will continue fluids at 150cc/hour- pt is awake, alert,  Have talked to him about central line placement.  He would prefer to wait to see if we can wean his meds first.  Although he would consent if BP remains low.    5:31 PM [facility] charge nurse states they are not able to take patient safely to their ED.  Charge nurse here has spoken to the [facility] who requested that I put the bed request in for [facility] ICU bed and she would do everything she could to get him the bed there.    6:31 PM pt now has [facility] ICU bed.  I have placed right femoral central line under sterile conditions, norepi is at 15, SBP is approx 100- pt remains awake, alert, interactive, tolerated procedure well. Of note, [facility] did call back and they have no ICU beds available.      Final Clinical Impressions(s) / ED Diagnoses   Final diagnoses:  Septic shock ([place])  Febrile illness    New Prescriptions New Prescriptions   No medications on file     Alfonzo Beers, MD 02/26/16 1842

## 2016-02-26 NOTE — ED Notes (Signed)
EDP made aware of patients BP and HR.  3rd liter bolus given.

## 2016-02-26 NOTE — ED Notes (Signed)
Md made aware of symptomatic low BP

## 2016-02-26 NOTE — ED Notes (Signed)
ED Provider at bedside. 

## 2016-02-26 NOTE — H&P (Signed)
Name: James Jennings MRN: [id number] DOB: 11-Apr-1970    LOS: 0  PCCM ADMISSION NOTE  History of Present Illness: Patient is a 46 yo M with pmhx of grade 3 non-hodgkin's B-cell lymphoma currently receiving chemotherapy (Rituxan/bendamustine-start on 02/13/2016). He was seen in clinic today by Dr. Marin Olp for  rash x 4 days, fever x 3 days, and one episode of emesis. He was subsequently sent to the ED for further evaluation. In the ED, patient was febrile (T max 102.8) and hypotensive (70s/50s) despite fluid resuscitation. A right femoral central line was placed per EDP and he was started on levo, epi, and dopamine with improvement in his pressures. Labs significant for a white count of 10.7. CMP unremarkable. LDH 241 and lactic acid 1.41. He received empiric tamiflu, vanc, and zosyn. He was transferred to [facility] for ICU admission.   On arrival to [facility], patient was hemodynamically stable (T 99.3, HR 123, RR 24, BP 104/78, Oxygen 99% on RA). Pressors were successfully weaned to levo alone. Patient was alert and speaking in full sentences. He reported that he first developed a diffuse, non painful, non itchy rash on Sunday. On Monday he developed intermittent fevers and chills. Today he woke up around 4am and tried to drink glass of water but developed emesis. He also developed a headache and neck stiffness. He reports having painful/sensative eyes but denies and visual changes. He also endorsed fatigue, sore throat, non productive cough, and diffuse muscle and joint pains. Sick contacts include his 50 year old son who is recovering from an ear infection and his wife who is a Education officer, museum. On ROS patient denied chest pain, SOB, abdominal pain, constipation, and diarrhea. He does report loose stool that started with his chemo last week but have since normalized.   Lines / Drains: 1/24 right femoral central line >>   Cultures: Blood 1/24 >>  Urine 1/24 >>  Influenza PCR 1/24 >> Checking RSV, CMV, EBV, Parvovirus  1/24 >>   Antibiotics: 1/24 Vanco >> 1/24 Zosyn >> 1/24 Tamiflu >>   Tests / Events: Transfer/admit 1/24 >>  Past Medical History:  Diagnosis Date  . H/O ventral hernia   . High cholesterol   . PONV (postoperative nausea and vomiting)   . Sleep apnea    "dx'd ~ 2008; never RX'd mask" (11/12/2015)   Past Surgical History:  Procedure Laterality Date  . ACHILLES TENDON SURGERY Left ~ 2012  . APPENDECTOMY  11/12/2015   lap appy  . CLUB FOOT RELEASE Bilateral 1971  . HERNIA REPAIR    . INGUINAL LYMPH NODE BIOPSY Right 01/20/2016   Procedure: EXCISIONAL BIOPSY OF RIGHT INGUINAL LYMPH NODE;  Surgeon: Greer Pickerel, MD;  Location: [facility];  Service: General;  Laterality: Right;  . LAPAROSCOPIC APPENDECTOMY N/A 11/12/2015   Procedure: APPENDECTOMY LAPAROSCOPIC;  Surgeon: Greer Pickerel, MD;  Location: [facility];  Service: General;  Laterality: N/A;  . SUTURE REMOVAL Left ~ 2016-2017 X 3   "achilles; under anesthesia"  . [place]   Prior to Admission medications   Medication Sig Start Date End Date Taking? Authorizing Provider  Amoxicillin-Pot Clavulanate (AUGMENTIN PO) Take by mouth.   Yes Historical Provider, MD  cetirizine (ZYRTEC) 10 MG tablet Take 10 mg by mouth daily as needed for allergies.    Historical Provider, MD  dexamethasone (DECADRON) 4 MG tablet Take 2 tablets (8 mg total) by mouth daily. Start the day after bendamustine chemotherapy for 2 days. Take with food. 02/13/16  [name], MD  famciclovir (FAMVIR) 500 MG tablet Take 1 tablet (500 mg total) by mouth daily. 02/07/16   [name], MD  Ibuprofen 200 MG CAPS Take 400 mg by mouth at bedtime as needed.    Historical Provider, MD  LORazepam (ATIVAN) 0.5 MG tablet Take 1 tablet (0.5 mg total) by mouth every 6 (six) hours as needed (Nausea or vomiting). 02/13/16   [name], MD  multivitamin (ONE-A-DAY MEN'S) TABS tablet Take 1 tablet by mouth daily.    Historical Provider, MD  ondansetron (ZOFRAN)  8 MG tablet Start on day 2 after bendamustine chemotherapy. 02/14/16   [name], MD  oxyCODONE (OXY IR/ROXICODONE) 5 MG immediate release tablet Take 1-2 tablets (5-10 mg total) by mouth every 6 (six) hours as needed for moderate pain, severe pain or breakthrough pain. 01/20/16   Greer Pickerel, MD  prochlorperazine (COMPAZINE) 10 MG tablet Take 1 tablet (10 mg total) by mouth every 6 (six) hours as needed (Nausea or vomiting). Patient not taking: Reported on 02/26/2016 02/13/16   [name], MD   Allergies Allergies  Allergen Reactions  . Shellfish Allergy Anaphylaxis  . Lactose Intolerance (Gi) Diarrhea  . Mango Flavor Swelling    MANGO(S) LIPS SWELL    Family History No family history on file.  Social History  reports that he has been smoking Cigarettes.  He has a 30.00 pack-year smoking history. He has quit using smokeless tobacco. His smokeless tobacco use included Snuff. He reports that he drinks about 2.4 oz of alcohol per week . He reports that he does not use drugs.  Review Of Systems  11 points review of systems is negative with an exception of listed in HPI.  Vital Signs: Temp:  [98.6 F (37 C)-102.8 F (39.3 C)] 99.3 F (37.4 C) (01/24 1941) Pulse Rate:  [91-138] 124 (01/24 1945) Resp:  [14-24] 24 (01/24 1945) BP: (71-126)/(49-85) 112/84 (01/24 1945) SpO2:  [96 %-100 %] 100 % (01/24 1945) Weight:  [109.3 kg (241 lb)] 109.3 kg (241 lb) (01/24 1257) No intake/output data recorded.  Physical Examination: General:  NAD Neuro:  A&O x 3, CN II - XII grossly intact, facial symmetry intact, no gross deficits, negative brudzinski sign HEENT: PERRL, normal oropharynx  Neck:  Supple, trachea midline  Cardiovascular:  Tachycardic but regular rhythm  Lungs:  CTAB, no wheezes, rales, or rhonchi  Abdomen:  Soft, non tender, non distended, normal bowel sounds  Extremties:  No edema, warm and well perfused Skin:  Diffuse, fine papular erythematous rash over his lower  extremities, abdomen, chest, and back   Ventilator settings:    Labs and Imaging:  Personally reviewed.  CXR 1/24 >> negative    Assessment and Plan:  PULMONARY  ASSESSMENT: No acute issues  Breathing comfortably on room air  PLAN:   Continue to monitor   CARDIOVASCULAR  ASSESSMENT:  Hypotension  Septic shock  PLAN:  Levophed  Off epi and dopamine   RENAL  ASSESSMENT:   No acute issues  PLAN:   Follow BMP  NS @ 125   GASTROINTESTINAL  ASSESSMENT:   Emesis, now tolerating some PO  PLAN:   Regular diet  Famotidine IV ppx  Zofran prn for nausea   HEMATOLOGIC  ASSESSMENT:   Mild leukocytosis  Immunocompromised  Follicular non-hodgkin's lymphoma grade III (follow with Dr. Marin Olp) PLAN:  Empiric abx  Follow CBC Sub q heparin for ppx   INFECTIOUS  ASSESSMENT:   Septic shock -  Fever and hypotension in an immunocompromised host, likely secondary to viral infection Unclear source, rash is likely viral exanthem vs drug reaction PLAN:   1/24 Vanco >> 1/24 Zosyn >> 1/24 Tamiflu >>  Home Famvir changed to valacyclovir for shingles ppx  Follow cultures  Follow influenza pcr  Checking parvovirus, EBV, CMV, RSV   Droplet precautions   ENDOCRINE  ASSESSMENT:   Hyperglycemia   PLAN:   SSI Follow CBGs Checking AM cortisol   NEUROLOGIC  ASSESSMENT:   No acute issues PLAN:   RASS goal 0 No sedation    Family Update: No family at bedside. Patient alert and provided history. Discussed code status, patient is full code and would like to be intubated if necessary.   The patient is critically ill with multiple organ systems failure and requires high complexity decision making for assessment and support, frequent evaluation and titration of therapies, application of advanced monitoring technologies and extensive interpretation of multiple databases. Critical Care Time devoted to patient care services described in this note is 35 minutes.  Velna Ochs 02/26/2016, 8:51 PM

## 2016-02-26 NOTE — ED Notes (Signed)
Pt is aware that a urine sample is needed but is unaware to obtain one at this time.

## 2016-02-26 NOTE — ED Notes (Signed)
Md aware of bp and new symptoms,

## 2016-02-26 NOTE — ED Notes (Signed)
Attempt to call report to ICU , nurse not available , in  Shift change

## 2016-02-27 ENCOUNTER — Other Ambulatory Visit: Payer: Self-pay | Admitting: Radiology

## 2016-02-27 ENCOUNTER — Inpatient Hospital Stay (HOSPITAL_COMMUNITY): Payer: 59

## 2016-02-27 DIAGNOSIS — R509 Fever, unspecified: Secondary | ICD-10-CM

## 2016-02-27 DIAGNOSIS — R6521 Severe sepsis with septic shock: Secondary | ICD-10-CM

## 2016-02-27 DIAGNOSIS — A419 Sepsis, unspecified organism: Secondary | ICD-10-CM

## 2016-02-27 LAB — GLUCOSE, CAPILLARY
GLUCOSE-CAPILLARY: 112 mg/dL — AB (ref 65–99)
GLUCOSE-CAPILLARY: 141 mg/dL — AB (ref 65–99)
GLUCOSE-CAPILLARY: 155 mg/dL — AB (ref 65–99)
GLUCOSE-CAPILLARY: 175 mg/dL — AB (ref 65–99)
Glucose-Capillary: 171 mg/dL — ABNORMAL HIGH (ref 65–99)
Glucose-Capillary: 108 mg/dL — ABNORMAL HIGH (ref 65–99)
Glucose-Capillary: 99 mg/dL (ref 65–99)

## 2016-02-27 LAB — BASIC METABOLIC PANEL
ANION GAP: 5 (ref 5–15)
BUN: <5 mg/dL — ABNORMAL LOW (ref 6–20)
CALCIUM: 7.2 mg/dL — AB (ref 8.9–10.3)
CO2: 21 mmol/L — ABNORMAL LOW (ref 22–32)
Chloride: 109 mmol/L (ref 101–111)
Creatinine, Ser: 0.91 mg/dL (ref 0.61–1.24)
Glucose, Bld: 113 mg/dL — ABNORMAL HIGH (ref 65–99)
POTASSIUM: 3.6 mmol/L (ref 3.5–5.1)
SODIUM: 135 mmol/L (ref 135–145)

## 2016-02-27 LAB — RESPIRATORY PANEL BY PCR
ADENOVIRUS-RVPPCR: NOT DETECTED
BORDETELLA PERTUSSIS-RVPCR: NOT DETECTED
CHLAMYDOPHILA PNEUMONIAE-RVPPCR: NOT DETECTED
CORONAVIRUS HKU1-RVPPCR: NOT DETECTED
CORONAVIRUS NL63-RVPPCR: NOT DETECTED
Coronavirus 229E: NOT DETECTED
Coronavirus OC43: NOT DETECTED
INFLUENZA A-RVPPCR: NOT DETECTED
Influenza B: NOT DETECTED
MYCOPLASMA PNEUMONIAE-RVPPCR: NOT DETECTED
Metapneumovirus: NOT DETECTED
Parainfluenza Virus 1: NOT DETECTED
Parainfluenza Virus 2: NOT DETECTED
Parainfluenza Virus 3: NOT DETECTED
Parainfluenza Virus 4: NOT DETECTED
RHINOVIRUS / ENTEROVIRUS - RVPPCR: NOT DETECTED
Respiratory Syncytial Virus: NOT DETECTED

## 2016-02-27 LAB — CBC
HCT: 32.8 % — ABNORMAL LOW (ref 39.0–52.0)
Hemoglobin: 11 g/dL — ABNORMAL LOW (ref 13.0–17.0)
MCH: 29.7 pg (ref 26.0–34.0)
MCHC: 33.5 g/dL (ref 30.0–36.0)
MCV: 88.6 fL (ref 78.0–100.0)
Platelets: 124 10*3/uL — ABNORMAL LOW (ref 150–400)
RBC: 3.7 MIL/uL — AB (ref 4.22–5.81)
RDW: 13.7 % (ref 11.5–15.5)
WBC: 7.4 10*3/uL (ref 4.0–10.5)

## 2016-02-27 LAB — URINE CULTURE: Culture: NO GROWTH

## 2016-02-27 LAB — PROCALCITONIN: Procalcitonin: 0.6 ng/mL

## 2016-02-27 LAB — IGG, IGA, IGM
IGM (IMMUNOGLOBIN M), SRM: 81 mg/dL (ref 20–172)
IgA, Qn, Serum: 173 mg/dL (ref 90–386)
IgG, Qn, Serum: 729 mg/dL (ref 700–1600)

## 2016-02-27 LAB — CK: CK TOTAL: 42 U/L — AB (ref 49–397)

## 2016-02-27 LAB — PHOSPHORUS: Phosphorus: 2 mg/dL — ABNORMAL LOW (ref 2.5–4.6)

## 2016-02-27 LAB — MAGNESIUM: MAGNESIUM: 1.3 mg/dL — AB (ref 1.7–2.4)

## 2016-02-27 LAB — BETA 2 MICROGLOBULIN, SERUM: Beta-2: 2.3 mg/L (ref 0.6–2.4)

## 2016-02-27 LAB — CORTISOL: CORTISOL PLASMA: 23 ug/dL

## 2016-02-27 MED ORDER — SODIUM CHLORIDE 0.9 % IV BOLUS (SEPSIS)
1000.0000 mL | Freq: Once | INTRAVENOUS | Status: AC
  Administered 2016-02-27: 1000 mL via INTRAVENOUS

## 2016-02-27 MED ORDER — OXYCODONE HCL 5 MG PO TABS
5.0000 mg | ORAL_TABLET | Freq: Four times a day (QID) | ORAL | Status: DC | PRN
  Administered 2016-02-27 – 2016-02-28 (×3): 5 mg via ORAL
  Filled 2016-02-27 (×3): qty 1

## 2016-02-27 MED ORDER — MAGNESIUM SULFATE 4 GM/100ML IV SOLN
4.0000 g | Freq: Once | INTRAVENOUS | Status: AC
  Administered 2016-02-27: 4 g via INTRAVENOUS
  Filled 2016-02-27: qty 100

## 2016-02-27 MED ORDER — POTASSIUM & SODIUM PHOSPHATES 280-160-250 MG PO PACK
2.0000 | PACK | Freq: Once | ORAL | Status: AC
  Administered 2016-02-27: 2 via ORAL
  Filled 2016-02-27: qty 2

## 2016-02-27 MED ORDER — IOPAMIDOL (ISOVUE-300) INJECTION 61%
INTRAVENOUS | Status: AC
  Administered 2016-02-27: 75 mL
  Filled 2016-02-27: qty 75

## 2016-02-27 MED ORDER — OXYCODONE HCL 5 MG PO TABS: 5.0000 mg | ORAL_TABLET | Freq: Four times a day (QID) | ORAL | Status: DC | PRN

## 2016-02-27 NOTE — Progress Notes (Signed)
Name: James Jennings MRN: [id number] DOB: 1970-10-13     History of Present Illness: Patient is a 46 yo M with pmhx of grade 3 non-hodgkin's B-cell lymphoma currently receiving chemotherapy (Rituxan/bendamustine-start on 02/13/2016). He was seen in clinic today by Dr. Marin Jennings for  rash x 4 days, fever x 3 days, and one episode of emesis. He was subsequently sent to the ED for further evaluation. In the ED, patient was febrile (T max 102.8) and hypotensive (70s/50s) despite fluid resuscitation. A right femoral central line was placed per EDP and he was started on levo, epi, and dopamine with improvement in his pressures. Labs significant for a white count of 10.7. CMP unremarkable. LDH 241 and lactic acid 1.41. He received empiric tamiflu, vanc, and zosyn. He was transferred to [facility] for ICU admission.   On arrival to [facility], patient was hemodynamically stable (T 99.3, HR 123, RR 24, BP 104/78, Oxygen 99% on RA). Pressors were successfully weaned to levo alone. Patient was alert and speaking in full sentences. He reported that he first developed a diffuse, non painful, non itchy rash on Sunday. On Monday he developed intermittent fevers and chills. Today he woke up around 4am and tried to drink glass of water but developed emesis. He also developed a headache and neck stiffness. He reports having painful/sensative eyes but denies and visual changes. He also endorsed fatigue, sore throat, non productive cough, and diffuse muscle and joint pains. Sick contacts include his 59 year old son who is recovering from an ear infection and his wife who is a Education officer, museum. On ROS patient denied chest pain, SOB, abdominal pain, constipation, and diarrhea. He does report loose stool that started with his chemo last week but have since normalized.   Lines / Drains: 1/24 right femoral central line >>   Cultures: Blood 1/24 >> pending Urine 1/24 >> pending Influenza PCR 1/24 >> pending Checking RSV, CMV, EBV, Parvovirus 1/24 >>  pending  Antibiotics: 1/24 Vanco >> 1/24 Zosyn >> 1/24 Tamiflu >>   Tests / Events: Transfer/admit 1/24 >>  Subjective: [name] reported that most of his symptoms including fever, joint pain and malaise started yesterday. He was recently started on chemotherapy on 1/11 and has received one session of chemo.  He has never had chemo therapy prior to 1/11.  During his office visit with Dr. Marin Jennings he was told to go to the ER.  Patient states he had a rash with raised bumps throughout his body that has improved. He states his other symptoms have improved slightly from yesterday.  He continues to have jaw pain and a sore throat but denies any difficulty breathing or swelling in his airway. He denies nuchal rigidity. Patient was started on broad-spectrum antibiotics and was febrile overnight.  Vital Signs: Temp:  [98.6 F (37 C)-102.8 F (39.3 C)] 101.9 F (38.8 C) (01/25 0337) Pulse Rate:  [91-138] 120 (01/25 0600) Resp:  [14-28] 20 (01/25 0600) BP: (71-126)/(49-85) 89/65 (01/25 0600) SpO2:  [96 %-100 %] 98 % (01/25 0600) Weight:  [109.3 kg (241 lb)-113.3 kg (249 lb 12.5 oz)] 113.3 kg (249 lb 12.5 oz) (01/25 0500) I/O last 3 completed shifts: In: 2849.8 [I.V.:2549.8; IV Piggyback:300] Out: 1450 [Urine:1450]  Physical Examination: General:  NAD Neuro:  A&O x 3, no nuchal rigidity  HEENT: PERRL, normal oropharynx, dry buccal mucosa   Neck:  Supple, trachea midline  Cardiovascular:  RRR, no m/g/r Lungs:  CTAB, no wheezes, rales, or rhonchi  Abdomen:  Soft, non tender, non  distended  Extremties:  No edema, warm and well perfused Skin:  Minimal, Diffuse, fine papular erythematous rash over his lower extremities, abdomen, and chest   Ventilator settings:    Labs and Imaging:  Personally reviewed.  CXR 1/24 >> negative    Assessment and Plan:  PULMONARY ASSESSMENT: No acute issues  Satting well on room air  PLAN:   Continue to monitor   CARDIOVASCULAR ASSESSMENT:   Hypotension  Septic shock  PLAN:  Levophed    RENAL ASSESSMENT:   No acute issues  PLAN:   Follow BMP    GASTROINTESTINAL ASSESSMENT:   Emesis, now tolerating some PO  PLAN:   Regular diet  Famotidine IV ppx  Zofran prn for nausea   HEMATOLOGIC ASSESSMENT:   Mild leukocytosis - resolved Immunocompromised  Follicular non-hodgkin's lymphoma grade III (follows with Dr. Marin Jennings) PLAN:  Empiric abx  Follow CBC Sub q heparin for ppx   INFECTIOUS ASSESSMENT:   Septic shock - Fever and hypotension in an immunocompromised host, likely secondary to viral infection Unclear source, rash is likely viral exanthem vs drug reaction PLAN:   1/24 Vanco >> 1/24 Zosyn >> 1/24 Tamiflu >>  Home Famvir changed to valacyclovir for shingles ppx  Follow cultures  Follow influenza pcr  Checking parvovirus, EBV, CMV, RSV   Droplet precautions   ENDOCRINE ASSESSMENT:   Hyperglycemia   Morning cortisol normal  PLAN:   SSI Follow CBGs  NEUROLOGIC ASSESSMENT:   No acute issues PLAN:   RASS goal 0 No sedation    Family Update: No family at bedside. Patient is full code and would like to be intubated if necessary.

## 2016-02-27 NOTE — Care Management Note (Signed)
Case Management Note  Patient Details  Name: Moices Morrice MRN: [id number] Date of Birth: 1970/02/16  Subjective/Objective:    Pt admitted with hypotension (requiring pressors), fever and rash                  Action/Plan:  Pt alert and oriented during assessment.   PTA from home independent.  CM will continue to follow for discharge needs   Expected Discharge Date:                  Expected Discharge Plan:  Home/Self Care  In-House Referral:     Discharge planning Services  CM Consult  Post Acute Care Choice:    Choice offered to:     DME Arranged:    DME Agency:     HH Arranged:    HH Agency:     Status of Service:  In process, will continue to follow  If discussed at Long Length of Stay Meetings, dates discussed:    Additional Comments:  Maryclare Labrador, RN 02/27/2016, 2:23 PM

## 2016-02-27 NOTE — H&P (Deleted)
Name: James Jennings MRN: [id number] DOB: 1970/07/15    LOS: 1   History of Present Illness: Patient is a 46 yo M with pmhx of grade 3 non-hodgkin's B-cell lymphoma currently receiving chemotherapy (Rituxan/bendamustine-start on 02/13/2016). He was seen in clinic today by Dr. Marin Jennings for  rash x 4 days, fever x 3 days, and one episode of emesis. He was subsequently sent to the ED for further evaluation. In the ED, patient was febrile (T max 102.8) and hypotensive (70s/50s) despite fluid resuscitation. A right femoral central line was placed per EDP and he was started on levo, epi, and dopamine with improvement in his pressures. Labs significant for a white count of 10.7. CMP unremarkable. LDH 241 and lactic acid 1.41. He received empiric tamiflu, vanc, and zosyn. He was transferred to [facility] for ICU admission.   On arrival to [facility], patient was hemodynamically stable (T 99.3, HR 123, RR 24, BP 104/78, Oxygen 99% on RA). Pressors were successfully weaned to levo alone. Patient was alert and speaking in full sentences. He reported that he first developed a diffuse, non painful, non itchy rash on Sunday. On Monday he developed intermittent fevers and chills. Today he woke up around 4am and tried to drink glass of water but developed emesis. He also developed a headache and neck stiffness. He reports having painful/sensative eyes but denies and visual changes. He also endorsed fatigue, sore throat, non productive cough, and diffuse muscle and joint pains. Sick contacts include his 49 year old son who is recovering from an ear infection and his wife who is a Education officer, museum. On ROS patient denied chest pain, SOB, abdominal pain, constipation, and diarrhea. He does report loose stool that started with his chemo last week but have since normalized.   Lines / Drains: 1/24 right femoral central line >>   Cultures: Blood 1/24 >> pending Urine 1/24 >> pending Influenza PCR 1/24 >> pending Checking RSV, CMV, EBV, Parvovirus  1/24 >> pending  Antibiotics: 1/24 Vanco >> 1/24 Zosyn >> 1/24 Tamiflu >>   Tests / Events: Transfer/admit 1/24 >>  Subjective: James Jennings reported that most of his symptoms including fever, joint pain and malaise started yesterday. He was recently started on chemotherapy on 1/11 and has received one session of chemo.  He has never had chemo therapy prior to 1/11.  During his office visit with Dr. Marin Jennings he was told to go to the ER.  Patient states he had a rash with raised bumps throughout his body that has improved. He states his other symptoms have improved slightly from yesterday.  He continues to have jaw pain and a sore throat but denies any difficulty breathing or swelling in his airway. Patient was started on broad-spectrum antibiotics and was febrile overnight.  Vital Signs: Temp:  [98.6 F (37 C)-102.8 F (39.3 C)] 101.9 F (38.8 C) (01/25 0337) Pulse Rate:  [91-138] 120 (01/25 0600) Resp:  [14-28] 20 (01/25 0600) BP: (71-126)/(49-85) 89/65 (01/25 0600) SpO2:  [96 %-100 %] 98 % (01/25 0600) Weight:  [109.3 kg (241 lb)-113.3 kg (249 lb 12.5 oz)] 113.3 kg (249 lb 12.5 oz) (01/25 0500) I/O last 3 completed shifts: In: 2849.8 [I.V.:2549.8; IV Piggyback:300] Out: 1450 [Urine:1450]  Physical Examination: General:  NAD Neuro:  A&O x 3 HEENT: PERRL, normal oropharynx, dry buccal mucosa   Neck:  Supple, trachea midline  Cardiovascular:  RRR, no m/g/r Lungs:  CTAB, no wheezes, rales, or rhonchi  Abdomen:  Soft, non tender, non distended  Extremties:  No  edema, warm and well perfused Skin:  Minimal, Diffuse, fine papular erythematous rash over his lower extremities, abdomen, and chest   Ventilator settings:    Labs and Imaging:  Personally reviewed.  CXR 1/24 >> negative    Assessment and Plan:  PULMONARY ASSESSMENT: No acute issues  Satting well on room air  PLAN:   Continue to monitor   CARDIOVASCULAR ASSESSMENT:  Hypotension  Septic shock  PLAN:  Levophed     RENAL ASSESSMENT:   No acute issues  PLAN:   Follow BMP    GASTROINTESTINAL ASSESSMENT:   Emesis, now tolerating some PO  PLAN:   Regular diet  Famotidine IV ppx  Zofran prn for nausea   HEMATOLOGIC ASSESSMENT:   Mild leukocytosis - resolved Immunocompromised  Follicular non-hodgkin's lymphoma grade III (follows with Dr. Marin Jennings) PLAN:  Empiric abx  Follow CBC Sub q heparin for ppx   INFECTIOUS ASSESSMENT:   Septic shock - Fever and hypotension in an immunocompromised host, likely secondary to viral infection Unclear source, rash is likely viral exanthem vs drug reaction PLAN:   1/24 Vanco >> 1/24 Zosyn >> 1/24 Tamiflu >>  Home Famvir changed to valacyclovir for shingles ppx  Follow cultures  Follow influenza pcr  Checking parvovirus, EBV, CMV, RSV   Droplet precautions   ENDOCRINE ASSESSMENT:   Hyperglycemia   Morning cortisol normal  PLAN:   SSI Follow CBGs  NEUROLOGIC ASSESSMENT:   No acute issues PLAN:   RASS goal 0 No sedation    Family Update: No family at bedside. Patient is full code and would like to be intubated if necessary.

## 2016-02-28 ENCOUNTER — Encounter (HOSPITAL_COMMUNITY): Payer: Self-pay | Admitting: General Practice

## 2016-02-28 DIAGNOSIS — R21 Rash and other nonspecific skin eruption: Secondary | ICD-10-CM

## 2016-02-28 DIAGNOSIS — C8280 Other types of follicular lymphoma, unspecified site: Secondary | ICD-10-CM

## 2016-02-28 LAB — PARVOVIRUS B19 ANTIBODY, IGG AND IGM
PAROVIRUS B19 IGG ABS: 2.1 {index} — AB (ref 0.0–0.8)
PAROVIRUS B19 IGM ABS: 0.2 {index} (ref 0.0–0.8)

## 2016-02-28 LAB — GLUCOSE, CAPILLARY
GLUCOSE-CAPILLARY: 117 mg/dL — AB (ref 65–99)
GLUCOSE-CAPILLARY: 124 mg/dL — AB (ref 65–99)
Glucose-Capillary: 152 mg/dL — ABNORMAL HIGH (ref 65–99)
Glucose-Capillary: 108 mg/dL — ABNORMAL HIGH (ref 65–99)
Glucose-Capillary: 134 mg/dL — ABNORMAL HIGH (ref 65–99)

## 2016-02-28 LAB — BASIC METABOLIC PANEL
Anion gap: 7 (ref 5–15)
BUN: 6 mg/dL (ref 6–20)
CALCIUM: 7.8 mg/dL — AB (ref 8.9–10.3)
CHLORIDE: 104 mmol/L (ref 101–111)
CO2: 25 mmol/L (ref 22–32)
Creatinine, Ser: 0.95 mg/dL (ref 0.61–1.24)
GFR calc non Af Amer: >60 mL/min (ref >60)
GLUCOSE: 148 mg/dL — AB (ref 65–99)
Potassium: 3.7 mmol/L (ref 3.5–5.1)
Sodium: 136 mmol/L (ref 135–145)

## 2016-02-28 LAB — CBC
HEMATOCRIT: 31.9 % — AB (ref 39.0–52.0)
HEMOGLOBIN: 10.8 g/dL — AB (ref 13.0–17.0)
MCH: 30.3 pg (ref 26.0–34.0)
MCHC: 33.9 g/dL (ref 30.0–36.0)
MCV: 89.4 fL (ref 78.0–100.0)
Platelets: 121 10*3/uL — ABNORMAL LOW (ref 150–400)
RBC: 3.57 MIL/uL — ABNORMAL LOW (ref 4.22–5.81)
RDW: 13.9 % (ref 11.5–15.5)
WBC: 6.3 10*3/uL (ref 4.0–10.5)

## 2016-02-28 LAB — EPSTEIN-BARR VIRUS VCA, IGM: EBV VCA IgM: <36 U/mL (ref 0.0–35.9)

## 2016-02-28 LAB — PROCALCITONIN: PROCALCITONIN: 0.63 ng/mL

## 2016-02-28 LAB — MAGNESIUM: Magnesium: 2 mg/dL (ref 1.7–2.4)

## 2016-02-28 LAB — EPSTEIN-BARR VIRUS VCA, IGG: EBV VCA IGG: 138 U/mL — AB (ref 0.0–17.9)

## 2016-02-28 LAB — CMV IGM: CMV IgM: <30 AU/mL (ref 0.0–29.9)

## 2016-02-28 LAB — PHOSPHORUS: Phosphorus: 2.2 mg/dL — ABNORMAL LOW (ref 2.5–4.6)

## 2016-02-28 MED ORDER — SODIUM PHOSPHATES 45 MMOLE/15ML IV SOLN
10.0000 mmol | Freq: Once | INTRAVENOUS | Status: AC
  Administered 2016-02-28: 10 mmol via INTRAVENOUS
  Filled 2016-02-28: qty 3.33

## 2016-02-28 NOTE — Progress Notes (Signed)
Rt Femoral central line removed per order, tolerated well. Pressure dressing applied, positioned flat in bed for 30 mts. No concerns.  James Jennings

## 2016-02-28 NOTE — Consult Note (Signed)
Referral MD  Reason for Referral: Sepsis, history of follicular large cell non-Hodgkin's lymphoma   Chief Complaint  Patient presents with  . Fever  : I had a fever and no rash.  HPI: James Jennings is well-known to me. He is a 46 year old white male. He is really diagnosed with follicular large cell lymphoma. He received his first cycle chemotherapy with Rituxan/bendamustine in January 11. He tolerated this well.  Earlier this week, he began to have a rash. He had a low-grade temperature. This appeared to worsen. He came to the office on Wednesday. He obviously was not feeling well. He was not neutropenic. He had no cough. He had no diarrhea. He had no mouth sores. There is no odynophagia.  He was admitted. He had hypotension. He is placed on levo fed. He's on IV antibiotics. All cultures are negative. Again he is not neutropenic.  Viral titers have all been negative. Influenza studies have been negative.  CT of the face. There is no retropharyngeal abscess.  He is feeling better. We are seeing him to try to help with any management issues.  His immunoglobulin studies have been okay. Sometimes, with lymphoma, these can be on the low side.  He says his rash is improving. His temperature is not as high. He said he had a high temperature of 102.5.  He'll normal pro calcitonin level.  His fosphenytoin easily have a low side. His renal function has been okay.  He's had some nausea. I'll think there's been any vomiting.  The staff in the ICU definitely have been doing a great job try to help him.      Past Medical History:  Diagnosis Date  . H/O ventral hernia   . High cholesterol   . PONV (postoperative nausea and vomiting)   . Sleep apnea    "dx'd ~ 2008; never RX'd mask" (11/12/2015)  :  Past Surgical History:  Procedure Laterality Date  . ACHILLES TENDON SURGERY Left ~ 2012  . APPENDECTOMY  11/12/2015   lap appy  . CLUB FOOT RELEASE Bilateral 1971  . HERNIA REPAIR    .  INGUINAL LYMPH NODE BIOPSY Right 01/20/2016   Procedure: EXCISIONAL BIOPSY OF RIGHT INGUINAL LYMPH NODE;  Surgeon: Greer Pickerel, MD;  Location: [facility];  Service: General;  Laterality: Right;  . LAPAROSCOPIC APPENDECTOMY N/A 11/12/2015   Procedure: APPENDECTOMY LAPAROSCOPIC;  Surgeon: Greer Pickerel, MD;  Location: [facility];  Service: General;  Laterality: N/A;  . SUTURE REMOVAL Left ~ 2016-2017 X 3   "achilles; under anesthesia"  . VENTRAL HERNIA REPAIR  1994  :   Current Facility-Administered Medications:  .  0.9 %  sodium chloride infusion, 250 mL, Intravenous, PRN, Florinda Marker, MD, Last Rate: 20 mL/hr at 02/27/16 0916, 250 mL at 02/27/16 0916 .  acetaminophen (TYLENOL) tablet 650 mg, 650 mg, Oral, Q4H PRN, Alexa R Burns, MD, 650 mg at 02/27/16 1707 .  heparin injection 5,000 Units, 5,000 Units, Subcutaneous, Q8H, Alexa R Burns, MD, 5,000 Units at 02/28/16 0600 .  insulin aspart (novoLOG) injection 0-9 Units, 0-9 Units, Subcutaneous, Q4H, Alexa Angela Burke, MD, 2 Units at 02/28/16 0359 .  norepinephrine (LEVOPHED) 4 mg in dextrose 5 % 250 mL (0.016 mg/mL) infusion, 0-40 mcg/min, Intravenous, Titrated, Alexa Angela Burke, MD, Stopped at 02/27/16 0100 .  ondansetron (ZOFRAN) injection 4 mg, 4 mg, Intravenous, Q6H PRN, Alexa R Burns, MD .  oxyCODONE (Oxy IR/ROXICODONE) immediate release tablet 5 mg, 5 mg, Oral, Q6H PRN, Valinda Party,  DO, 5 mg at 02/28/16 0415 .  piperacillin-tazobactam (ZOSYN) IVPB 3.375 g, 3.375 g, Intravenous, Q8H, Alfonzo Beers, MD, 3.375 g at 02/28/16 0400 .  sodium chloride 0.9 % bolus 1,000 mL, 1,000 mL, Intravenous, Once, Alfonzo Beers, MD .  valACYclovir (VALTREX) tablet 1,000 mg, 1,000 mg, Oral, Daily, Alexa R Quay Burow, MD, 1,000 mg at 02/27/16 0919 .  vancomycin (VANCOCIN) IVPB 1000 mg/200 mL premix, 1,000 mg, Intravenous, Q8H, Alfonzo Beers, MD, 1,000 mg at 02/27/16 2326:  . heparin  5,000 Units Subcutaneous Q8H  . insulin aspart  0-9 Units Subcutaneous Q4H  .  piperacillin-tazobactam (ZOSYN)  IV  3.375 g Intravenous Q8H  . sodium chloride  1,000 mL Intravenous Once  . valACYclovir  1,000 mg Oral Daily  . vancomycin  1,000 mg Intravenous Q8H  :  Allergies  Allergen Reactions  . Shellfish Allergy Anaphylaxis  . Lactose Intolerance (Gi) Diarrhea  . Mango Flavor Swelling and Other (See Comments)    MANGO(S) LIPS SWELL  :  No family history on file.:  Social History   Social History  . Marital status: Married    Spouse name: N/A  . Number of children: N/A  . Years of education: N/A   Occupational History  . Not on file.   Social History Main Topics  . Smoking status: Current Every Day Smoker    Packs/day: 1.00    Years: 30.00    Types: Cigarettes  . Smokeless tobacco: Former Systems developer    Types: Snuff     Comment: 11/12/2015 "quit using chew in ~ 1997"  . Alcohol use 2.4 oz/week    4 Shots of liquor per week     Comment: occas drink 1-2 times per week   . Drug use: No  . Sexual activity: Yes   Other Topics Concern  . Not on file   Social History Narrative  . No narrative on file  :  Pertinent items are noted in HPI.  Exam: Patient Vitals for the past 24 hrs:  BP Temp Temp src Pulse Resp SpO2 Weight  02/28/16 0600 (!) 88/59 - - 94 20 96 % -  02/28/16 0500 (!) 106/56 - - 90 (!) 21 97 % -  02/28/16 0409 - - - - - - 249 lb 12.5 oz (113.3 kg)  02/28/16 0400 96/61 - - 100 17 96 % -  02/28/16 0335 - 99.7 F (37.6 C) Oral - - - -  02/28/16 0300 (!) 95/55 - - (!) 109 14 96 % -  02/28/16 0200 (!) 84/55 - - (!) 108 (!) 22 94 % -  02/28/16 0100 113/69 - - 99 17 98 % -  02/28/16 0000 97/69 - - 98 (!) 22 96 % -  02/27/16 2317 - 97.8 F (36.6 C) Oral - - - -  02/27/16 2300 95/64 - - 98 (!) 22 98 % -  02/27/16 2200 (!) 91/52 - - 90 (!) 25 99 % -  02/27/16 2100 (!) 95/57 - - 96 (!) 21 96 % -  02/27/16 2000 (!) 97/50 - - (!) 103 (!) 22 96 % -  02/27/16 1959 - 99 F (37.2 C) Oral - - - -  02/27/16 1900 (!) 100/54 - - (!) 109 (!)  23 95 % -  02/27/16 1800 102/60 - - (!) 114 16 96 % -  02/27/16 1730 98/64 - - (!) 111 (!) 27 96 % -  02/27/16 1700 95/63 - - (!) 106 (!) 24 99 % -  02/27/16  1630 110/72 - - (!) 103 (!) 28 98 % -  02/27/16 1600 102/67 - - (!) 101 (!) 25 99 % -  02/27/16 1549 - 98.9 F (37.2 C) Oral - - - -  02/27/16 1300 (!) 87/62 - - (!) 116 (!) 26 93 % -  02/27/16 1230 106/61 - - (!) 117 (!) 21 98 % -  02/27/16 1200 121/73 - - (!) 111 (!) 23 97 % -  02/27/16 1138 - 99.3 F (37.4 C) Oral - - - -  02/27/16 1130 98/61 - - (!) 113 (!) 23 98 % -  02/27/16 1114 (!) 103/56 - - (!) 115 (!) 24 98 % -  02/27/16 1100 - - - (!) 111 (!) 22 97 % -  02/27/16 1000 106/66 - - (!) 108 19 96 % -  02/27/16 0900 (!) 81/65 - - (!) 109 (!) 27 98 % -  02/27/16 0801 - 99.8 F (37.7 C) Oral - - - -  02/27/16 0800 106/60 - - (!) 111 (!) 25 94 % -    As above    Recent Labs  02/27/16 0459 02/28/16 0406  WBC 7.4 6.3  HGB 11.0* 10.8*  HCT 32.8* 31.9*  PLT 124* 121*    Recent Labs  02/27/16 0459 02/28/16 0406  NA 135 136  K 3.6 3.7  CL 109 104  CO2 21* 25  GLUCOSE 113* 148*  BUN <5* 6  CREATININE 0.91 0.95  CALCIUM 7.2* 7.8*    Blood smear review:  None  Pathology: None     Assessment and Plan:  [name] is a 46 year old white male. He has a follicular large cell lymphoma. His prognostic category is very good. He really does not have bulky disease by any means.  I'll still unsure as to why he has this "sepsis". Cultures are all negative. I would not think that this is from chemotherapy. He has not been on allopurinol. I don't know if Famvir could cause the rash.  He has not been neutropenic. His hemoglobin is a little bit on the lower side but this is not surprising given his chemotherapy.  We will follow along. He is not immunosuppressed with respect to low immunoglobulins.  I appreciate all great help that he is getting for everybody in the ICU. Hopefully, he will be moved out of the ICU today or  tomorrow.  Lattie Haw, MD  Darlyn Chamber 29:11

## 2016-02-28 NOTE — Progress Notes (Signed)
Name: James Jennings MRN: [id number] DOB: Apr 03, 1970     History of Present Illness: Patient is a 46 yo M with pmhx of grade 3 non-hodgkin's B-cell lymphoma currently receiving chemotherapy (Rituxan/bendamustine-start on 02/13/2016). He was seen in clinic today by Dr. Marin Olp for  rash x 4 days, fever x 3 days, and one episode of emesis. He was subsequently sent to the ED for further evaluation. In the ED, patient was febrile (T max 102.8) and hypotensive (70s/50s) despite fluid resuscitation. A right femoral central line was placed per EDP and he was started on levo, epi, and dopamine with improvement in his pressures. Labs significant for a white count of 10.7. CMP unremarkable. LDH 241 and lactic acid 1.41. He received empiric tamiflu, vanc, and zosyn. He was transferred to [facility] for ICU admission.   On arrival to [facility], patient was hemodynamically stable (T 99.3, HR 123, RR 24, BP 104/78, Oxygen 99% on RA). Pressors were successfully weaned to levo alone. Patient was alert and speaking in full sentences. He reported that he first developed a diffuse, non painful, non itchy rash on Sunday. On Monday he developed intermittent fevers and chills. Today he woke up around 4am and tried to drink glass of water but developed emesis. He also developed a headache and neck stiffness. He reports having painful/sensative eyes but denies and visual changes. He also endorsed fatigue, sore throat, non productive cough, and diffuse muscle and joint pains. Sick contacts include his 89 year old son who is recovering from an ear infection and his wife who is a Education officer, museum. On ROS patient denied chest pain, SOB, abdominal pain, constipation, and diarrhea. He does report loose stool that started with his chemo last week but have since normalized.   Lines / Drains: 1/24 right femoral central line >>   Cultures: Blood 1/24 >> NGTD Urine 1/24 >> no growth Influenza PCR 1/24 >> not detected Respiratory panel 1/25 > none  detected Checking RSV, CMV, EBV, Parvovirus 1/24 >> pending  Antibiotics: 1/24 Vanco >> 1/24 Zosyn >> 1/24 Tamiflu >>1/25   Tests / Events: Transfer/admit 1/24 >> CT neck 1/23 > no evidence of inflammatory neck pathology. No evidence of retropharyngeal abscess.  Subjective: Afebrile for past 24 hrs. Patient states that his joint pains and myalgias have improved.  He states that his jaw pain has resolved.    Vital Signs: Temp:  [98.6 F (37 C)-102.8 F (39.3 C)] 101.9 F (38.8 C) (01/25 0337) Pulse Rate:  [91-138] 120 (01/25 0600) Resp:  [14-28] 20 (01/25 0600) BP: (71-126)/(49-85) 89/65 (01/25 0600) SpO2:  [96 %-100 %] 98 % (01/25 0600) Weight:  [109.3 kg (241 lb)-113.3 kg (249 lb 12.5 oz)] 113.3 kg (249 lb 12.5 oz) (01/25 0500) I/O last 3 completed shifts: In: 2849.8 [I.V.:2549.8; IV Piggyback:300] Out: 1450 [Urine:1450]  Physical Examination: General:  NAD Neuro:  A&O x 3 HEENT: PERRL, normal oropharynx Neck:  Supple, trachea midline  Cardiovascular:  RRR, no m/g/r Lungs:  CTAB, no wheezes, rales, or rhonchi  Abdomen:  Soft, non tender, non distended  Extremties:  No edema, warm and well perfused Skin:  Warm and dry, no rash   Ventilator settings:    Labs and Imaging:  Personally reviewed.  CXR 1/24 >> negative    Assessment and Plan:  PULMONARY ASSESSMENT: No acute issues  Satting well on room air  PLAN:   Continue to monitor   CARDIOVASCULAR ASSESSMENT:  Hypotension  Septic shock  PLAN:  Not on pressors Continue to monitor  BP    RENAL ASSESSMENT:   No acute issues  PLAN:   Follow BMP    GASTROINTESTINAL ASSESSMENT:   Emesis, now tolerating PO PLAN:   Regular diet  Zofran prn for nausea   HEMATOLOGIC ASSESSMENT:   Mild leukocytosis - resolved  Follicular non-hodgkin's lymphoma grade III (follows with Dr. Marin Olp) PLAN:  Empiric abx  Follow CBC Sub q heparin for ppx   INFECTIOUS ASSESSMENT:   Septic shock - Fever and  hypotension in an immunocompromised host, likely secondary to viral infection Rash - Unclear source, rash is likely viral exanthem vs drug reaction - resolved PLAN:   1/24 Vanco >> 1/24 Zosyn >> 1/24 Tamiflu >> 1/25 Home Famvir changed to valacyclovir for shingles ppx  Follow cultures  Follow influenza pcr - negative Checking parvovirus, EBV, CMV, RSV   Droplet precautions   ENDOCRINE ASSESSMENT:   Hyperglycemia   Morning cortisol normal  PLAN:   SSI Follow CBGs  NEUROLOGIC ASSESSMENT:   No acute issues PLAN:   RASS goal 0 No sedation    Family Update: No family at bedside. Patient is full code and would like to be intubated if necessary.

## 2016-02-29 DIAGNOSIS — C8223 Follicular lymphoma grade III, unspecified, intra-abdominal lymph nodes: Secondary | ICD-10-CM

## 2016-02-29 LAB — RSV(RESPIRATORY SYNCYTIAL VIRUS) AB, BLOOD: RSV Ab: 1:16 {titer} — ABNORMAL HIGH

## 2016-02-29 LAB — GLUCOSE, CAPILLARY
GLUCOSE-CAPILLARY: 126 mg/dL — AB (ref 65–99)
GLUCOSE-CAPILLARY: 86 mg/dL (ref 65–99)
Glucose-Capillary: 146 mg/dL — ABNORMAL HIGH (ref 65–99)
Glucose-Capillary: 99 mg/dL (ref 65–99)
Glucose-Capillary: 91 mg/dL (ref 65–99)
Glucose-Capillary: 114 mg/dL — ABNORMAL HIGH (ref 65–99)

## 2016-02-29 LAB — BASIC METABOLIC PANEL
Anion gap: 8 (ref 5–15)
BUN: 6 mg/dL (ref 6–20)
CO2: 26 mmol/L (ref 22–32)
Calcium: 8.3 mg/dL — ABNORMAL LOW (ref 8.9–10.3)
Chloride: 105 mmol/L (ref 101–111)
Creatinine, Ser: 0.77 mg/dL (ref 0.61–1.24)
Glucose, Bld: 106 mg/dL — ABNORMAL HIGH (ref 65–99)
Potassium: 3.9 mmol/L (ref 3.5–5.1)
SODIUM: 139 mmol/L (ref 135–145)

## 2016-02-29 LAB — CBC
HCT: 35.4 % — ABNORMAL LOW (ref 39.0–52.0)
Hemoglobin: 11.8 g/dL — ABNORMAL LOW (ref 13.0–17.0)
MCH: 29.7 pg (ref 26.0–34.0)
MCHC: 33.3 g/dL (ref 30.0–36.0)
MCV: 89.2 fL (ref 78.0–100.0)
PLATELETS: 152 10*3/uL (ref 150–400)
RBC: 3.97 MIL/uL — AB (ref 4.22–5.81)
RDW: 13.4 % (ref 11.5–15.5)
WBC: 4.1 10*3/uL (ref 4.0–10.5)

## 2016-02-29 LAB — MAGNESIUM: MAGNESIUM: 2.1 mg/dL (ref 1.7–2.4)

## 2016-02-29 LAB — PHOSPHORUS: PHOSPHORUS: 3.3 mg/dL (ref 2.5–4.6)

## 2016-02-29 NOTE — Progress Notes (Signed)
[facility]  PROGRESS NOTE  James Jennings [id number] DOB: Feb 24, 1970 DOA: 02/26/2016 PCP: Cammy Copa, MD   Brief HPI:    46 yo M with pmhx of grade 3 non-hodgkin's B-cell lymphoma currently receiving chemotherapy (Rituxan/bendamustine-start on 02/13/2016). He was seen in clinic today by Dr. Marin Olp for  rash x 4 days, fever x 3 days, and one episode of emesis. He was subsequently sent to the ED for further evaluation. In the ED, patient was febrile (T max 102.8) and hypotensive (70s/50s) despite fluid resuscitation. Patient was empirically started on vancomycin and Zosyn for sepsis from unknown source. At this time hypotension has resolved. Patient is off pressors, antibiotics have been discontinued. Patient was moved out of ICU and [facility] was called to resume care on 02/29/2016..    Subjective   This morning patient denies any chest pain or shortness of breath. Blood pressure has been stable off antibiotics.   Assessment/Plan:     1. Hypotension- Resolved, likely induced by a viral illness versus drug reaction from chemotherapeutic agent. Antibiotics have been discontinued. We will continue to monitor patient for next 24 hours. If blood pressure stable will discharge in a.m. 2. Diabetes mellitus-continue sliding scale insulin with NovoLog 3. Follicular non-Hodgkin lymphoma grade 3- followed by oncology as outpatient. Dr. Marin Olp has been following patient in the hospital. 4. Herpes zoster prophylaxis- continue Valtrex    DVT prophylaxis: Heparin  Code Status: Full code  Family Communication: No family present at bedside   Disposition Plan: Likely home in next 24-48 hours   Consultants:  PC CM  Procedures:  None  Continuous infusions     Antibiotics:   Anti-infectives    Start     Dose/Rate Route Frequency Ordered Stop   02/27/16 1000  valACYclovir (VALTREX) tablet 1,000 mg     1,000 mg Oral Daily 02/26/16 2209     02/27/16 0000   vancomycin (VANCOCIN) IVPB 1000 mg/200 mL premix  Status:  Discontinued     1,000 mg 200 mL/hr over 60 Minutes Intravenous Every 8 hours 02/26/16 1342 02/28/16 1020   02/26/16 2000  piperacillin-tazobactam (ZOSYN) IVPB 3.375 g  Status:  Discontinued     3.375 g 12.5 mL/hr over 240 Minutes Intravenous Every 8 hours 02/26/16 1342 02/28/16 1020   02/26/16 1530  oseltamivir (TAMIFLU) capsule 75 mg     75 mg Oral  Once 02/26/16 1526 02/26/16 1536   02/26/16 1345  vancomycin (VANCOCIN) IVPB 1000 mg/200 mL premix     1,000 mg 200 mL/hr over 60 Minutes Intravenous Every hour 02/26/16 1340 02/26/16 1544   02/26/16 1300  piperacillin-tazobactam (ZOSYN) IVPB 3.375 g     3.375 g 100 mL/hr over 30 Minutes Intravenous  Once 02/26/16 1252 02/26/16 1412   02/26/16 1300  vancomycin (VANCOCIN) IVPB 1000 mg/200 mL premix  Status:  Discontinued     1,000 mg 200 mL/hr over 60 Minutes Intravenous  Once 02/26/16 1252 02/26/16 1341       Objective   Vitals:   02/28/16 1330 02/28/16 1427 02/28/16 2228 02/29/16 0510  BP: 104/63 122/74 110/69 (!) 107/55  Pulse: 94 99 85 78  Resp: 20  20 18   Temp:  99.1 F (37.3 C) 97.7 F (36.5 C) 98.5 F (36.9 C)  TempSrc:  Oral Oral   SpO2: 97% 99% 98% 95%  Weight:    113.8 kg (250 lb 14.1 oz)  Height:        Intake/Output Summary (Last 24 hours) at 02/29/16 1257 Last data  filed at 02/29/16 0900  Gross per 24 hour  Intake              180 ml  Output              800 ml  Net             -620 ml   Filed Weights   02/27/16 0500 02/28/16 0409 02/29/16 0510  Weight: 113.3 kg (249 lb 12.5 oz) 113.3 kg (249 lb 12.5 oz) 113.8 kg (250 lb 14.1 oz)     Physical Examination:  General exam: Appears calm and comfortable. Respiratory system: Clear to auscultation. Respiratory effort normal. Cardiovascular system:  RRR. No  murmurs, rubs, gallops. No pedal edema. GI system: Abdomen is nondistended, soft and nontender. No organomegaly.  Central nervous system. No  focal neurological deficits. 5 x 5 power in all extremities. Skin: No rashes, lesions or ulcers. Psychiatry: Alert, oriented x 3.Judgement and insight appear normal. Affect normal.    Data Reviewed: I have personally reviewed following labs and imaging studies  CBG:  Recent Labs Lab 02/28/16 1608 02/28/16 2010 02/29/16 0005 02/29/16 0421 02/29/16 0811  GLUCAP 124* 134* 86 146* 99    CBC:  Recent Labs Lab 02/26/16 1002 02/27/16 0459 02/28/16 0406 02/29/16 0720  WBC 10.7* 7.4 6.3 4.1  NEUTROABS 9.3*  --   --   --   HGB 15.4 11.0* 10.8* 11.8*  HCT 43.8 32.8* 31.9* 35.4*  MCV 88 88.6 89.4 89.2  PLT 164 124* 121* 0000000    Basic Metabolic Panel:  Recent Labs Lab 02/26/16 1002 02/26/16 1258 02/27/16 0143 02/27/16 0459 02/28/16 0406 02/29/16 0720  NA 136 131*  --  135 136 139  K 4.0 3.6  --  3.6 3.7 3.9  CL 101 99*  --  109 104 105  CO2 25 25  --  21* 25 26  GLUCOSE 141* 154*  --  113* 148* 106*  BUN 8 9  --  <5* 6 6  CREATININE 1.1 1.06  --  0.91 0.95 0.77  CALCIUM 8.9 8.4*  --  7.2* 7.8* 8.3*  MG  --   --  1.3*  --  2.0 2.1  PHOS  --   --  2.0*  --  2.2* 3.3    Recent Results (from the past 240 hour(s))  Blood Culture (routine x 2)     Status: None (Preliminary result)   Collection Time: 02/26/16 12:55 PM  Result Value Ref Range Status   Specimen Description BLOOD RIGHT ANTECUBITAL  Final   Special Requests BOTTLES DRAWN AEROBIC AND ANAEROBIC 5CC  Final   Culture   Final    NO GROWTH 3 DAYS Performed at [facility], [address], [place], [place] [address]    Report Status PENDING  Incomplete  Blood Culture (routine x 2)     Status: None (Preliminary result)   Collection Time: 02/26/16  1:15 PM  Result Value Ref Range Status   Specimen Description BLOOD BLOOD LEFT WRIST  Final   Special Requests BOTTLES DRAWN AEROBIC AND ANAEROBIC 5CC  Final   Culture   Final    NO GROWTH 3 DAYS Performed at [facility], [place] [address]  [place], [place] [address]    Report Status PENDING  Incomplete  Urine culture     Status: None   Collection Time: 02/26/16  2:30 PM  Result Value Ref Range Status   Specimen Description URINE, RANDOM  Final  Special Requests NONE  Final   Culture   Final    NO GROWTH Performed at [facility], [place] [address], [place],  [address]    Report Status 02/27/2016 FINAL  Final  MRSA PCR Screening     Status: None   Collection Time: 02/26/16  9:17 PM  Result Value Ref Range Status   MRSA by PCR NEGATIVE NEGATIVE Final    Comment:        The GeneXpert MRSA Assay (FDA approved for NASAL specimens only), is one component of a comprehensive MRSA colonization surveillance program. It is not intended to diagnose MRSA infection nor to guide or monitor treatment for MRSA infections.   Respiratory Panel by PCR     Status: None   Collection Time: 02/27/16  1:04 AM  Result Value Ref Range Status   Adenovirus NOT DETECTED NOT DETECTED Final   Coronavirus 229E NOT DETECTED NOT DETECTED Final   Coronavirus HKU1 NOT DETECTED NOT DETECTED Final   Coronavirus NL63 NOT DETECTED NOT DETECTED Final   Coronavirus OC43 NOT DETECTED NOT DETECTED Final   Metapneumovirus NOT DETECTED NOT DETECTED Final   Rhinovirus / Enterovirus NOT DETECTED NOT DETECTED Final   Influenza A NOT DETECTED NOT DETECTED Final   Influenza B NOT DETECTED NOT DETECTED Final   Parainfluenza Virus 1 NOT DETECTED NOT DETECTED Final   Parainfluenza Virus 2 NOT DETECTED NOT DETECTED Final   Parainfluenza Virus 3 NOT DETECTED NOT DETECTED Final   Parainfluenza Virus 4 NOT DETECTED NOT DETECTED Final   Respiratory Syncytial Virus NOT DETECTED NOT DETECTED Final   Bordetella pertussis NOT DETECTED NOT DETECTED Final   Chlamydophila pneumoniae NOT DETECTED NOT DETECTED Final   Mycoplasma pneumoniae NOT DETECTED NOT DETECTED Final     Liver Function Tests:  Recent Labs Lab 02/26/16 1002 02/26/16 1258  AST 26 26  ALT  29 23  ALKPHOS 96* 82  BILITOT 0.90 0.7  PROT 6.8 6.8  ALBUMIN 3.6 3.5   No results for input(s): LIPASE, AMYLASE in the last 168 hours. No results for input(s): AMMONIA in the last 168 hours.  Cardiac Enzymes:  Recent Labs Lab 02/27/16 0115  CKTOTAL 42*   BNP (last 3 results) No results for input(s): BNP in the last 8760 hours.  ProBNP (last 3 results) No results for input(s): PROBNP in the last 8760 hours.    Studies: Ct Soft Tissue Neck W Contrast  Result Date: 02/27/2016 CLINICAL DATA:  Lymphoma with jaw pain over the last 2 weeks. Retropharyngeal abscess. EXAM: CT NECK WITH CONTRAST TECHNIQUE: Multidetector CT imaging of the neck was performed using the standard protocol following the bolus administration of intravenous contrast. CONTRAST:  30mL ISOVUE-300 IOPAMIDOL (ISOVUE-300) INJECTION 61% COMPARISON:  None. FINDINGS: Pharynx and larynx: No mucosal or submucosal lesion is seen. Question paresis of the right vocal fold which is slightly midline and associated with slight asymmetric prominence of the right piriform sinus. No evidence of tonsillar or peritonsillar inflammation. No evidence of retropharyngeal effusion, inflammatory change or abscess. Salivary glands: Submandibular and parotid glands are symmetric and normal. Thyroid: Normal Lymph nodes: No enlarged or low-density nodes on either side of the neck. Vascular: Mild atherosclerotic change of the carotid bifurcations without stenosis. No acute vascular finding. Limited intracranial: Normal Visualized orbits: Normal Mastoids and visualized paranasal sinuses: Clear Skeleton: Spine is negative. No evidence of mandibular or maxillary pathology. No evidence of dental or periodontal disease. Upper chest: Emphysema at the lung apices.  No acute finding.  Other: None significant IMPRESSION: No evidence of inflammatory neck pathology. No evidence of retropharyngeal abscess. No cause of jaw pain identified. Emphysema at the lung  apices. Atherosclerotic change of the carotid bifurcations without stenosis, premature for age. Question right vocal fold paresis. Vocal fold is more apposed to the midline and there is slight asymmetric prominence of the right piriform sinus. Electronically Signed   By: Nelson Chimes M.D.   On: 02/27/2016 15:42    Scheduled Meds: . heparin  5,000 Units Subcutaneous Q8H  . insulin aspart  0-9 Units Subcutaneous Q4H  . sodium chloride  1,000 mL Intravenous Once  . valACYclovir  1,000 mg Oral Daily      Time spent: 25 min  [facility] Pager [phone]. If 7PM-7AM, please contact night-coverage at [email or web address], Office  [phone]  password [email or web address] 02/29/2016, 12:57 PM  LOS: 3 days

## 2016-02-29 NOTE — Evaluation (Signed)
Physical Therapy Evaluation Patient Details Name: Tristyn Harne MRN: [id number] DOB: 28-Mar-1970 Today's Date: 02/29/2016   History of Present Illness   46 yo M with pmhx of grade 3 non-hodgkin's B-cell lymphoma currently receiving chemotherapy (Rituxan/bendamustine-start on 02/13/2016) admitted 02-26-16 for septic shock.   Clinical Impression  PT eval complete. Pt is independent with all functional mobility. No further PT services indicated. PT signing off.    Follow Up Recommendations No PT follow up    Equipment Recommendations  None recommended by PT    Recommendations for Other Services       Precautions / Restrictions Precautions Precautions: None      Mobility  Bed Mobility Overal bed mobility: Independent                Transfers Overall transfer level: Independent                  Ambulation/Gait Ambulation/Gait assistance: Independent Ambulation Distance (Feet): 500 Feet Assistive device: None Gait Pattern/deviations: WFL(Within Functional Limits)   Gait velocity interpretation: >2.62 ft/sec, indicative of independent community ambulator    Stairs Stairs: Yes Stairs assistance: Modified independent (Device/Increase time) Stair Management: One rail Left;Forwards;Alternating pattern Number of Stairs: 4    Wheelchair Mobility    Modified Rankin (Stroke Patients Only)       Balance Overall balance assessment: No apparent balance deficits (not formally assessed)                                           Pertinent Vitals/Pain Pain Assessment: No/denies pain    Home Living Family/patient expects to be discharged to:: Private residence Living Arrangements: Spouse/significant other;Children Available Help at Discharge: Family;Available 24 hours/day Type of Home: House Home Access: Stairs to enter   [employer] of Steps: 2 Home Layout: Two level;Bed/bath upstairs Home Equipment: Shower seat      Prior  Function Level of Independence: Independent         Comments: Works outside the home. Drives. Active in community.     Hand Dominance        Extremity/Trunk Assessment   Upper Extremity Assessment Upper Extremity Assessment: Overall WFL for tasks assessed    Lower Extremity Assessment Lower Extremity Assessment: Overall WFL for tasks assessed    Cervical / Trunk Assessment Cervical / Trunk Assessment: Normal  Communication   Communication: No difficulties  Cognition Arousal/Alertness: Awake/alert Behavior During Therapy: WFL for tasks assessed/performed Overall Cognitive Status: Within Functional Limits for tasks assessed                      General Comments      Exercises     Assessment/Plan    PT Assessment Patent does not need any further PT services  PT Problem List            PT Treatment Interventions      PT Goals (Current goals can be found in the Care Plan section)  Acute Rehab PT Goals Patient Stated Goal: home PT Goal Formulation: All assessment and education complete, DC therapy    Frequency     Barriers to discharge        Co-evaluation               End of Session Equipment Utilized During Treatment: Gait belt Activity Tolerance: Patient tolerated treatment well Patient left: in bed;with call bell/phone  within reach Nurse Communication: Mobility status         Time: 0910-0922 PT Time Calculation (min) (ACUTE ONLY): 12 min   Charges:   PT Evaluation $PT Eval Low Complexity: 1 Procedure     PT G Codes:        Lorriane Shire 02/29/2016, 9:44 AM

## 2016-03-01 LAB — MAGNESIUM: MAGNESIUM: 1.9 mg/dL (ref 1.7–2.4)

## 2016-03-01 LAB — GLUCOSE, CAPILLARY
GLUCOSE-CAPILLARY: 109 mg/dL — AB (ref 65–99)
GLUCOSE-CAPILLARY: 128 mg/dL — AB (ref 65–99)
Glucose-Capillary: 109 mg/dL — ABNORMAL HIGH (ref 65–99)

## 2016-03-01 LAB — PHOSPHORUS: PHOSPHORUS: 3.3 mg/dL (ref 2.5–4.6)

## 2016-03-01 NOTE — Discharge Summary (Signed)
Physician Discharge Summary  Eliyahu Bille IRW:[id number] DOB: [birth year] DOA: 02/26/2016  PCP: Cammy Copa, MD  Admit date: 02/26/2016 Discharge date: 03/01/2016  Time spent: 25* minutes  Recommendations for Outpatient Follow-up:  1. Follow up Oncology as outpatient 2. Follow up PCP in one week 3. Follow final blood culture report   Discharge Diagnoses:  Active Problems:   Follicular lymphoma grade III of intra-abdominal lymph nodes ([name])   Tobacco use   Septic shock ([name])   Febrile illness   Discharge Condition: Stable  Diet recommendation: Regular diet  Filed Weights   02/28/16 0409 02/29/16 0510 03/01/16 0417  Weight: 113.3 kg (249 lb 12.5 oz) 113.8 kg (250 lb 14.1 oz) 107.2 kg (236 lb 4.8 oz)    History of present illness:  46 yo M with pmhx of grade 3 non-hodgkin's B-cell lymphoma currently receiving chemotherapy (Rituxan/bendamustine-start on 02/13/2016). He was seen in clinic today by Dr. Marin Olp for rash x 4 days, fever x 3 days, and one episode of emesis. He was subsequently sent to the ED for further evaluation. In the ED, patient was febrile (T max 102.8) and hypotensive (70s/50s) despite fluid resuscitation. Patient was empirically started on vancomycin and Zosyn for sepsis from unknown source. At this time hypotension has resolved. Patient is off pressors, antibiotics have been discontinued. Patient was moved out of ICU and [facility] was called to resume care on 02/29/2016.[name]  Hospital Course:  1. Hypotension- Resolved, likely induced by a viral illness versus drug reaction from chemotherapeutic agent. Antibiotics have been discontinued. blood pressure stable for past 24 hrs. Will discharge home. Blood cultures negative to date. 2. Diabetes mellitus- not on medications at home. Blood glucose was stable in the hospital, need to follow up PCP for further recommendations. 3. Follicular non-Hodgkin lymphoma grade 3- followed by oncology as outpatient.   4. Herpes zoster prophylaxis- continue Valtrex  Procedures:  None   Consultations:  PCCM  Discharge Exam: Vitals:   02/29/16 2020 03/01/16 0417  BP: 115/63 116/62  Pulse: 72 77  Resp: 18 17  Temp: 97.8 F (36.6 C) 97.6 F (36.4 C)    General: Appears in no acute distress Cardiovascular: RRR Respiratory: Clear bilaterally  Discharge Instructions   Discharge Instructions    Diet - low sodium heart healthy    Complete by:  As directed    Increase activity slowly    Complete by:  As directed      Current Discharge Medication List    CONTINUE these medications which have NOT CHANGED   Details  acetaminophen (TYLENOL) 325 MG tablet Take 650 mg by mouth every 6 (six) hours as needed for moderate pain or headache.    cetirizine (ZYRTEC) 10 MG tablet Take 10 mg by mouth daily as needed for allergies.    dexamethasone (DECADRON) 4 MG tablet Take 2 tablets (8 mg total) by mouth daily. Start the day after bendamustine chemotherapy for 2 days. Take with food. Qty: 30 tablet, Refills: 1   Associated Diagnoses: Follicular lymphoma grade III of intra-abdominal lymph nodes ([name])    famciclovir (FAMVIR) 500 MG tablet Take 1 tablet (500 mg total) by mouth daily. Qty: 30 tablet, Refills: 12   Associated Diagnoses: Follicular lymphoma grade III of intra-abdominal lymph nodes ([name])    LORazepam (ATIVAN) 0.5 MG tablet Take 1 tablet (0.5 mg total) by mouth every 6 (six) hours as needed (Nausea or vomiting). Qty: 30 tablet, Refills: 0   Associated Diagnoses: Follicular lymphoma grade III of intra-abdominal lymph nodes (  [place])    multivitamin (ONE-A-DAY MEN'S) TABS tablet Take 1 tablet by mouth daily.    ondansetron (ZOFRAN) 8 MG tablet Start on day 2 after bendamustine chemotherapy. Qty: 30 tablet, Refills: 1   Associated Diagnoses: Follicular lymphoma grade III of intra-abdominal lymph nodes ([name])    prochlorperazine (COMPAZINE) 10 MG tablet Take 1 tablet (10 mg total) by mouth  every 6 (six) hours as needed (Nausea or vomiting). Qty: 30 tablet, Refills: 1   Associated Diagnoses: Follicular lymphoma grade III of intra-abdominal lymph nodes ([name])    oxyCODONE (OXY IR/ROXICODONE) 5 MG immediate release tablet Take 1-2 tablets (5-10 mg total) by mouth every 6 (six) hours as needed for moderate pain, severe pain or breakthrough pain. Qty: 15 tablet, Refills: 0      STOP taking these medications     amoxicillin-clavulanate (AUGMENTIN) 875-125 MG tablet        Allergies  Allergen Reactions  . Shellfish Allergy Anaphylaxis  . Lactose Intolerance (Gi) Diarrhea  . Mango Flavor Swelling and Other (See Comments)    MANGO(S) LIPS SWELL      The results of significant diagnostics from this hospitalization (including imaging, microbiology, ancillary and laboratory) are listed below for reference.    Significant Diagnostic Studies: Dg Chest 2 View  Result Date: 02/26/2016 CLINICAL DATA:  Cough, rash and fever for 3 days.  Lymphoma. EXAM: CHEST  2 VIEW COMPARISON:  None. FINDINGS: Trachea is midline. Heart size normal. Lungs are clear. No pleural fluid. IMPRESSION: No acute findings. Electronically Signed   By: Lorin Picket M.D.   On: 02/26/2016 13:34   Ct Soft Tissue Neck W Contrast  Result Date: 02/27/2016 CLINICAL DATA:  Lymphoma with jaw pain over the last 2 weeks. Retropharyngeal abscess. EXAM: CT NECK WITH CONTRAST TECHNIQUE: Multidetector CT imaging of the neck was performed using the standard protocol following the bolus administration of intravenous contrast. CONTRAST:  64m ISOVUE-300 IOPAMIDOL (ISOVUE-300) INJECTION 61% COMPARISON:  None. FINDINGS: Pharynx and larynx: No mucosal or submucosal lesion is seen. Question paresis of the right vocal fold which is slightly midline and associated with slight asymmetric prominence of the right piriform sinus. No evidence of tonsillar or peritonsillar inflammation. No evidence of retropharyngeal effusion, inflammatory  change or abscess. Salivary glands: Submandibular and parotid glands are symmetric and normal. Thyroid: Normal Lymph nodes: No enlarged or low-density nodes on either side of the neck. Vascular: Mild atherosclerotic change of the carotid bifurcations without stenosis. No acute vascular finding. Limited intracranial: Normal Visualized orbits: Normal Mastoids and visualized paranasal sinuses: Clear Skeleton: Spine is negative. No evidence of mandibular or maxillary pathology. No evidence of dental or periodontal disease. Upper chest: Emphysema at the lung apices.  No acute finding. Other: None significant IMPRESSION: No evidence of inflammatory neck pathology. No evidence of retropharyngeal abscess. No cause of jaw pain identified. Emphysema at the lung apices. Atherosclerotic change of the carotid bifurcations without stenosis, premature for age. Question right vocal fold paresis. Vocal fold is more apposed to the midline and there is slight asymmetric prominence of the right piriform sinus. Electronically Signed   By: MNelson ChimesM.D.   On: 02/27/2016 15:42   Ct Biopsy  Result Date: 02/05/2016 INDICATION: 46year old with a diagnosis of B-cell lymphoma and request for bone marrow biopsy. EXAM: CT GUIDED BONE MARROW ASPIRATES AND BIOPSY Physician: AStephan Minister HAnselm Pancoast MD MEDICATIONS: None. ANESTHESIA/SEDATION: Fentanyl 5.0 mcg IV; Versed 100 mg IV Moderate Sedation Time:  18 minutes The patient was continuously monitored during the  procedure by the interventional radiology nurse under my direct supervision. COMPLICATIONS: None immediate. PROCEDURE: The procedure was explained to the patient. The risks and benefits of the procedure were discussed and the patient's questions were addressed. Informed consent was obtained from the patient. The patient was placed prone on CT scan. Images of the pelvis were obtained. The right side of back was prepped and draped in sterile fashion. The skin and right posterior iliac bone were  anesthetized with 1% lidocaine. 11 gauge bone needle was directed into the right iliac bone with CT guidance. Two aspirates and two core biopsies obtained. Bandage placed over the puncture site. FINDINGS: Biopsy needle directed into the posterior right ilium. IMPRESSION: CT guided bone marrow aspirates and core biopsy. Electronically Signed   By: Markus Daft M.D.   On: 02/05/2016 10:55    Microbiology: Recent Results (from the past 240 hour(s))  Blood Culture (routine x 2)     Status: None (Preliminary result)   Collection Time: 02/26/16 12:55 PM  Result Value Ref Range Status   Specimen Description BLOOD RIGHT ANTECUBITAL  Final   Special Requests BOTTLES DRAWN AEROBIC AND ANAEROBIC 5CC  Final   Culture   Final    NO GROWTH 3 DAYS Performed at [facility], [address], [place], [place] [address]    Report Status PENDING  Incomplete  Blood Culture (routine x 2)     Status: None (Preliminary result)   Collection Time: 02/26/16  1:15 PM  Result Value Ref Range Status   Specimen Description BLOOD BLOOD LEFT WRIST  Final   Special Requests BOTTLES DRAWN AEROBIC AND ANAEROBIC 5CC  Final   Culture   Final    NO GROWTH 3 DAYS Performed at [facility], [place] [address] [place], [place] [address]    Report Status PENDING  Incomplete  Urine culture     Status: None   Collection Time: 02/26/16  2:30 PM  Result Value Ref Range Status   Specimen Description URINE, RANDOM  Final   Special Requests NONE  Final   Culture   Final    NO GROWTH Performed at [facility], [place] [address], [place],  [address]    Report Status 02/27/2016 FINAL  Final  MRSA PCR Screening     Status: None   Collection Time: 02/26/16  9:17 PM  Result Value Ref Range Status   MRSA by PCR NEGATIVE NEGATIVE Final    Comment:        The GeneXpert MRSA Assay (FDA approved for NASAL specimens only), is one component of a comprehensive MRSA colonization surveillance program. It is not intended  to diagnose MRSA infection nor to guide or monitor treatment for MRSA infections.   Respiratory Panel by PCR     Status: None   Collection Time: 02/27/16  1:04 AM  Result Value Ref Range Status   Adenovirus NOT DETECTED NOT DETECTED Final   Coronavirus 229E NOT DETECTED NOT DETECTED Final   Coronavirus HKU1 NOT DETECTED NOT DETECTED Final   Coronavirus NL63 NOT DETECTED NOT DETECTED Final   Coronavirus OC43 NOT DETECTED NOT DETECTED Final   Metapneumovirus NOT DETECTED NOT DETECTED Final   Rhinovirus / Enterovirus NOT DETECTED NOT DETECTED Final   Influenza A NOT DETECTED NOT DETECTED Final   Influenza B NOT DETECTED NOT DETECTED Final   Parainfluenza Virus 1 NOT DETECTED NOT DETECTED Final   Parainfluenza Virus 2 NOT DETECTED NOT DETECTED Final   Parainfluenza Virus  3 NOT DETECTED NOT DETECTED Final   Parainfluenza Virus 4 NOT DETECTED NOT DETECTED Final   Respiratory Syncytial Virus NOT DETECTED NOT DETECTED Final   Bordetella pertussis NOT DETECTED NOT DETECTED Final   Chlamydophila pneumoniae NOT DETECTED NOT DETECTED Final   Mycoplasma pneumoniae NOT DETECTED NOT DETECTED Final     Labs: Basic Metabolic Panel:  Recent Labs Lab 02/26/16 1002 02/26/16 1258 02/27/16 0143 02/27/16 0459 02/28/16 0406 02/29/16 0720 03/01/16 0510  NA 136 131*  --  135 136 139  --   K 4.0 3.6  --  3.6 3.7 3.9  --   CL 101 99*  --  109 104 105  --   CO2 25 25  --  21* 25 26  --   GLUCOSE 141* 154*  --  113* 148* 106*  --   BUN 8 9  --  <5* 6 6  --   CREATININE 1.1 1.06  --  0.91 0.95 0.77  --   CALCIUM 8.9 8.4*  --  7.2* 7.8* 8.3*  --   MG  --   --  1.3*  --  2.0 2.1 1.9  PHOS  --   --  2.0*  --  2.2* 3.3 3.3   Liver Function Tests:  Recent Labs Lab 02/26/16 1002 02/26/16 1258  AST 26 26  ALT 29 23  ALKPHOS 96* 82  BILITOT 0.90 0.7  PROT 6.8 6.8  ALBUMIN 3.6 3.5   No results for input(s): LIPASE, AMYLASE in the last 168 hours. No results for input(s): AMMONIA in the last  168 hours. CBC:  Recent Labs Lab 02/26/16 1002 02/27/16 0459 02/28/16 0406 02/29/16 0720  WBC 10.7* 7.4 6.3 4.1  NEUTROABS 9.3*  --   --   --   HGB 15.4 11.0* 10.8* 11.8*  HCT 43.8 32.8* 31.9* 35.4*  MCV 88 88.6 89.4 89.2  PLT 164 124* 121* 152   Cardiac Enzymes:  Recent Labs Lab 02/27/16 0115  CKTOTAL 42*   BNP:  CBG:  Recent Labs Lab 02/29/16 1639 02/29/16 2020 03/01/16 0007 03/01/16 0414 03/01/16 0814  GLUCAP 114* 126* 128* 109* 109*       Signed:  Oswald Hillock MD.  [facility] 03/01/2016, 8:52 AM

## 2016-03-02 ENCOUNTER — Ambulatory Visit (HOSPITAL_COMMUNITY): Payer: 59

## 2016-03-02 ENCOUNTER — Inpatient Hospital Stay (HOSPITAL_COMMUNITY): Admission: RE | Admit: 2016-03-02 | Payer: 59 | Source: Ambulatory Visit

## 2016-03-02 LAB — CULTURE, BLOOD (ROUTINE X 2)
CULTURE: NO GROWTH
Culture: NO GROWTH

## 2016-03-11 ENCOUNTER — Other Ambulatory Visit: Payer: Self-pay | Admitting: *Deleted

## 2016-03-11 DIAGNOSIS — C8223 Follicular lymphoma grade III, unspecified, intra-abdominal lymph nodes: Secondary | ICD-10-CM

## 2016-03-12 ENCOUNTER — Other Ambulatory Visit: Payer: 59

## 2016-03-12 ENCOUNTER — Ambulatory Visit (HOSPITAL_BASED_OUTPATIENT_CLINIC_OR_DEPARTMENT_OTHER): Payer: 59 | Admitting: Hematology & Oncology

## 2016-03-12 ENCOUNTER — Ambulatory Visit (HOSPITAL_BASED_OUTPATIENT_CLINIC_OR_DEPARTMENT_OTHER): Payer: 59

## 2016-03-12 VITALS — BP 114/75 | HR 110 | Temp 98.4°F | Resp 18 | Wt 237.0 lb

## 2016-03-12 VITALS — HR 95

## 2016-03-12 DIAGNOSIS — C8223 Follicular lymphoma grade III, unspecified, intra-abdominal lymph nodes: Secondary | ICD-10-CM

## 2016-03-12 DIAGNOSIS — R0602 Shortness of breath: Secondary | ICD-10-CM

## 2016-03-12 DIAGNOSIS — R509 Fever, unspecified: Secondary | ICD-10-CM | POA: Diagnosis not present

## 2016-03-12 DIAGNOSIS — Z5111 Encounter for antineoplastic chemotherapy: Secondary | ICD-10-CM

## 2016-03-12 DIAGNOSIS — R21 Rash and other nonspecific skin eruption: Secondary | ICD-10-CM

## 2016-03-12 LAB — CMP (CANCER CENTER ONLY)
ALBUMIN: 3.3 g/dL (ref 3.3–5.5)
ALK PHOS: 116 U/L — AB (ref 26–84)
ALT: 22 U/L (ref 10–47)
AST: 24 U/L (ref 11–38)
BUN: 9 mg/dL (ref 7–22)
CHLORIDE: 102 meq/L (ref 98–108)
CO2: 29 mEq/L (ref 18–33)
Calcium: 9.3 mg/dL (ref 8.0–10.3)
Creat: 0.9 mg/dl (ref 0.6–1.2)
Glucose, Bld: 111 mg/dL (ref 73–118)
Potassium: 4.2 mEq/L (ref 3.3–4.7)
Sodium: 143 mEq/L (ref 128–145)
TOTAL PROTEIN: 7.4 g/dL (ref 6.4–8.1)
Total Bilirubin: 0.6 mg/dl (ref 0.20–1.60)

## 2016-03-12 LAB — CBC WITH DIFFERENTIAL (CANCER CENTER ONLY)
BASO#: 0 10*3/uL (ref 0.0–0.2)
BASO%: 0.4 % (ref 0.0–2.0)
EOS%: 3.7 % (ref 0.0–7.0)
Eosinophils Absolute: 0.3 10*3/uL (ref 0.0–0.5)
HCT: 39.9 % (ref 38.7–49.9)
HEMOGLOBIN: 13.6 g/dL (ref 13.0–17.1)
LYMPH#: 0.8 10*3/uL — ABNORMAL LOW (ref 0.9–3.3)
LYMPH%: 11 % — AB (ref 14.0–48.0)
MCH: 30.6 pg (ref 28.0–33.4)
MCHC: 34.1 g/dL (ref 32.0–35.9)
MCV: 90 fL (ref 82–98)
MONO#: 0.8 10*3/uL (ref 0.1–0.9)
MONO%: 11 % (ref 0.0–13.0)
NEUT%: 73.9 % (ref 40.0–80.0)
NEUTROS ABS: 5.4 10*3/uL (ref 1.5–6.5)
PLATELETS: 305 10*3/uL (ref 145–400)
RBC: 4.45 10*6/uL (ref 4.20–5.70)
RDW: 13.3 % (ref 11.1–15.7)
WBC: 7.3 10*3/uL (ref 4.0–10.0)

## 2016-03-12 MED ORDER — DEXAMETHASONE SODIUM PHOSPHATE 10 MG/ML IJ SOLN
10.0000 mg | Freq: Once | INTRAMUSCULAR | Status: AC
  Administered 2016-03-12: 10 mg via INTRAVENOUS

## 2016-03-12 MED ORDER — DEXAMETHASONE SODIUM PHOSPHATE 10 MG/ML IJ SOLN
INTRAMUSCULAR | Status: AC
  Filled 2016-03-12: qty 1

## 2016-03-12 MED ORDER — PALONOSETRON HCL INJECTION 0.25 MG/5ML
INTRAVENOUS | Status: AC
Start: 2016-03-12 — End: 2016-03-12
  Filled 2016-03-12: qty 5

## 2016-03-12 MED ORDER — SODIUM CHLORIDE 0.9 % IV SOLN
90.0000 mg/m2 | Freq: Once | INTRAVENOUS | Status: AC
  Administered 2016-03-12: 200 mg via INTRAVENOUS
  Filled 2016-03-12: qty 8

## 2016-03-12 MED ORDER — PALONOSETRON HCL INJECTION 0.25 MG/5ML
0.2500 mg | Freq: Once | INTRAVENOUS | Status: AC
  Administered 2016-03-12: 0.25 mg via INTRAVENOUS

## 2016-03-12 MED ORDER — SODIUM CHLORIDE 0.9 % IV SOLN
Freq: Once | INTRAVENOUS | Status: AC
  Administered 2016-03-12: 10:00:00 via INTRAVENOUS

## 2016-03-12 NOTE — Patient Instructions (Signed)
[facility] Discharge Instructions for Patients Receiving Chemotherapy  Today you received the following chemotherapy agents Bendeka.  To help prevent nausea and vomiting after your treatment, we encourage you to take your nausea medication as directed. NO ZOFRAN FOR 3 DAYS. If you develop nausea and vomiting that is not controlled by your nausea medication, call the clinic.   BELOW ARE SYMPTOMS THAT SHOULD BE REPORTED IMMEDIATELY:  *FEVER GREATER THAN 100.5 F  *CHILLS WITH OR WITHOUT FEVER  NAUSEA AND VOMITING THAT IS NOT CONTROLLED WITH YOUR NAUSEA MEDICATION  *UNUSUAL SHORTNESS OF BREATH  *UNUSUAL BRUISING OR BLEEDING  TENDERNESS IN MOUTH AND THROAT WITH OR WITHOUT PRESENCE OF ULCERS  *URINARY PROBLEMS  *BOWEL PROBLEMS  UNUSUAL RASH Items with * indicate a potential emergency and should be followed up as soon as possible.  Feel free to call the clinic you have any questions or concerns. The clinic phone number is [phone].  Please show the CHEMO ALERT CARD at check-in to the Emergency Department and triage nurse.    

## 2016-03-12 NOTE — Progress Notes (Signed)
No Tylenol or Benadryl since NO rituxan per MD Ennever

## 2016-03-12 NOTE — Progress Notes (Signed)
Hematology and Oncology Follow Up Visit  James Jennings [id number] 1970/11/07 46 y.o. 03/12/2016   Principle Diagnosis:  Follicular large cell non-Hodgkin's lymphoma (FLIPI-2 = 0)  Current Therapy:   Rituxan/bendamustine-s/p cycle #1 (Will hold Rituxan today)    Interim History:  James Jennings is back for follow-up. He was hospitalized after his first cycle of chemotherapy. He had a fever. He had a rash. All cultures were negative. I'm not sure if this may been some, reaction to Rituxan. As such, I want to hold Rituxan this cycle.  He looks quite good. He's had a problem with cough or shortness of breath. He's had no nausea or vomiting. He's had no rashes. He's had no leg swelling. He's had no abdominal pain.  He's had no headache. He's had no mouth sores. The been no visual problems.  Overall, his performance status is ECOG 0. Eyes Medications:  Allergies as of 03/12/2016      Reactions   Shellfish Allergy Anaphylaxis   Lactose Intolerance (gi) Diarrhea   Mango Flavor Swelling, Other (See Comments)   MANGO(S) LIPS SWELL      Medication List       Accurate as of 03/12/16  9:30 AM. Always use your most recent med list.          acetaminophen 325 MG tablet Commonly known as:  TYLENOL Take 650 mg by mouth every 6 (six) hours as needed for moderate pain or headache.   cetirizine 10 MG tablet Commonly known as:  ZYRTEC Take 10 mg by mouth daily as needed for allergies.   dexamethasone 4 MG tablet Commonly known as:  DECADRON Take 2 tablets (8 mg total) by mouth daily. Start the day after bendamustine chemotherapy for 2 days. Take with food.   famciclovir 500 MG tablet Commonly known as:  FAMVIR Take 1 tablet (500 mg total) by mouth daily.   LORazepam 0.5 MG tablet Commonly known as:  ATIVAN Take 1 tablet (0.5 mg total) by mouth every 6 (six) hours as needed (Nausea or vomiting).   multivitamin Tabs tablet Take 1 tablet by mouth daily.   ondansetron 8 MG tablet Commonly  known as:  ZOFRAN Start on day 2 after bendamustine chemotherapy.   prochlorperazine 10 MG tablet Commonly known as:  COMPAZINE Take 1 tablet (10 mg total) by mouth every 6 (six) hours as needed (Nausea or vomiting).       Allergies:  Allergies  Allergen Reactions  . Shellfish Allergy Anaphylaxis  . Lactose Intolerance (Gi) Diarrhea  . Mango Flavor Swelling and Other (See Comments)    MANGO(S) LIPS SWELL    Past Medical History, Surgical history, Social history, and Family History were reviewed and updated.  Review of Systems: All other 10 point review of systems is negative.   Physical Exam:  weight is 237 lb (107.5 kg). His oral temperature is 98.4 F (36.9 C). His blood pressure is 114/75 and his pulse is 110 (abnormal). His respiration is 18 and oxygen saturation is 99%.   Wt Readings from Last 3 Encounters:  03/12/16 237 lb (107.5 kg)  03/01/16 236 lb 4.8 oz (107.2 kg)  02/07/16 240 lb (108.9 kg)    Well-developed well-nourished white male. Head and neck exam shows no ocular or oral lesions. He has no palpable adenopathy in the neck. Thyroid is nonpalpable. Lungs are clear bilaterally. Cardiac exam regular rate and rhythm with no murmurs, rubs or bruits. Abdomen is soft. Has good bowel sounds. There is no fluid wave. There is  no palpable liver or spleen tip. Back exam shows no tenderness over the spine, ribs or hips. Extremities shows no clubbing, cyanosis or edema. Neurological exam shows no focal neurological deficits. Skin exam shows no rashes, ecchymosis or petechia.  Lab Results  Component Value Date   WBC 7.3 03/12/2016   HGB 13.6 03/12/2016   HCT 39.9 03/12/2016   MCV 90 03/12/2016   PLT 305 03/12/2016   No results found for: FERRITIN, IRON, TIBC, UIBC, IRONPCTSAT Lab Results  Component Value Date   RETICCTPCT 1.07 12/06/2015   RBC 4.45 03/12/2016   RETICCTABS 48.79 12/06/2015   No results found for: Nils Pyle [facility] Lab Results   Component Value Date   IGGSERUM 729 02/26/2016   IGMSERUM 81 02/26/2016   No results found for: Odetta Pink, SPEI   Chemistry      Component Value Date/Time   NA 139 02/29/2016 0720   NA 136 02/26/2016 1002   NA 141 02/07/2016 1149   K 3.9 02/29/2016 0720   K 4.0 02/26/2016 1002   K 4.3 02/07/2016 1149   CL 105 02/29/2016 0720   CL 101 02/26/2016 1002   CO2 26 02/29/2016 0720   CO2 25 02/26/2016 1002   CO2 27 02/07/2016 1149   BUN 6 02/29/2016 0720   BUN 8 02/26/2016 1002   BUN 10.9 02/07/2016 1149   CREATININE 0.77 02/29/2016 0720   CREATININE 1.1 02/26/2016 1002   CREATININE 0.9 02/07/2016 1149      Component Value Date/Time   CALCIUM 8.3 (L) 02/29/2016 0720   CALCIUM 8.9 02/26/2016 1002   CALCIUM 9.6 02/07/2016 1149   ALKPHOS 82 02/26/2016 1258   ALKPHOS 96 (H) 02/26/2016 1002   ALKPHOS 127 02/07/2016 1149   AST 26 02/26/2016 1258   AST 26 02/26/2016 1002   AST 17 02/07/2016 1149   ALT 23 02/26/2016 1258   ALT 29 02/26/2016 1002   ALT 16 02/07/2016 1149   BILITOT 0.7 02/26/2016 1258   BILITOT 0.90 02/26/2016 1002   BILITOT 0.42 02/07/2016 1149     Impression and Plan: James Jennings is a pleasant 46 yo white male with follicular large cell non-Hodgkin's lymphoma.   We'll go ahead with his second cycle of treatment. Again, we are holding the Rituxan. I'm still not sure as to what happened to him last visit. I'm not sure what may have caused this symptoms that he had. Maybe he did have some count of viral syndrome.  After this cycle, we will repeat a PET scan and see how things look. I'll like to hope that he is in remission.   If he has no problems with this cycle of treatment, I may re-add the Rituxan with his next cycle. I truly would like to give Rituxan if possible as I think this really makes a significant benefit   We will see him back in one month.   [name], MD 2/8/20189:30 AM

## 2016-03-13 ENCOUNTER — Ambulatory Visit (HOSPITAL_BASED_OUTPATIENT_CLINIC_OR_DEPARTMENT_OTHER): Payer: 59

## 2016-03-13 VITALS — BP 130/65 | HR 99 | Temp 97.8°F | Resp 18

## 2016-03-13 DIAGNOSIS — Z5111 Encounter for antineoplastic chemotherapy: Secondary | ICD-10-CM

## 2016-03-13 DIAGNOSIS — C8223 Follicular lymphoma grade III, unspecified, intra-abdominal lymph nodes: Secondary | ICD-10-CM

## 2016-03-13 MED ORDER — HEPARIN SOD (PORK) LOCK FLUSH 100 UNIT/ML IV SOLN
500.0000 [IU] | Freq: Once | INTRAVENOUS | Status: DC | PRN
  Filled 2016-03-13: qty 5

## 2016-03-13 MED ORDER — DEXAMETHASONE SODIUM PHOSPHATE 10 MG/ML IJ SOLN
10.0000 mg | Freq: Once | INTRAMUSCULAR | Status: AC
  Administered 2016-03-13: 10 mg via INTRAVENOUS

## 2016-03-13 MED ORDER — DEXAMETHASONE SODIUM PHOSPHATE 10 MG/ML IJ SOLN
INTRAMUSCULAR | Status: AC
  Filled 2016-03-13: qty 1

## 2016-03-13 MED ORDER — SODIUM CHLORIDE 0.9% FLUSH
10.0000 mL | INTRAVENOUS | Status: DC | PRN
  Filled 2016-03-13: qty 10

## 2016-03-13 MED ORDER — SODIUM CHLORIDE 0.9 % IV SOLN
Freq: Once | INTRAVENOUS | Status: AC
  Administered 2016-03-13: 09:00:00 via INTRAVENOUS

## 2016-03-13 MED ORDER — SODIUM CHLORIDE 0.9 % IV SOLN
90.0000 mg/m2 | Freq: Once | INTRAVENOUS | Status: AC
  Administered 2016-03-13: 200 mg via INTRAVENOUS
  Filled 2016-03-13: qty 8

## 2016-03-13 NOTE — Patient Instructions (Signed)
Bendamustine Injection What is this medicine? BENDAMUSTINE (BEN da MUS teen) is a chemotherapy drug. It is used to treat chronic lymphocytic leukemia and non-Hodgkin lymphoma. COMMON BRAND NAME(S): BENDEKA, Treanda What should I tell my health care provider before I take this medicine? They need to know if you have any of these conditions: -infection (especially a virus infection such as chickenpox, cold sores, or herpes) -kidney disease -liver disease -an unusual or allergic reaction to bendamustine, mannitol, other medicines, foods, dyes, or preservatives -pregnant or trying to get pregnant -breast-feeding How should I use this medicine? This medicine is for infusion into a vein. It is given by a health care professional in a hospital or clinic setting. Talk to your pediatrician regarding the use of this medicine in children. Special care may be needed. What if I miss a dose? It is important not to miss your dose. Call your doctor or health care professional if you are unable to keep an appointment. What may interact with this medicine? Do not take this medicine with any of the following medications: -clozapine This medicine may also interact with the following medications: -atazanavir -cimetidine -ciprofloxacin -enoxacin -fluvoxamine -medicines for seizures like carbamazepine and phenobarbital -mexiletine -rifampin -tacrine -thiabendazole -zileuton What should I watch for while using this medicine? This drug may make you feel generally unwell. This is not uncommon, as chemotherapy can affect healthy cells as well as cancer cells. Report any side effects. Continue your course of treatment even though you feel ill unless your doctor tells you to stop. You may need blood work done while you are taking this medicine. Call your doctor or health care professional for advice if you get a fever, chills or sore throat, or other symptoms of a cold or flu. Do not treat yourself. This drug  decreases your body's ability to fight infections. Try to avoid being around people who are sick. This medicine may increase your risk to bruise or bleed. Call your doctor or health care professional if you notice any unusual bleeding. Talk to your doctor about your risk of cancer. You may be more at risk for certain types of cancers if you take this medicine. Do not become pregnant while taking this medicine or for 3 months after stopping it. Women should inform their doctor if they wish to become pregnant or think they might be pregnant. Men should not father a child while taking this medicine and for 3 months after stopping it.There is a potential for serious side effects to an unborn child. Talk to your health care professional or pharmacist for more information. Do not breast-feed an infant while taking this medicine. This medicine may interfere with the ability to have a child. You should talk with your doctor or health care professional if you are concerned about your fertility. What side effects may I notice from receiving this medicine? Side effects that you should report to your doctor or health care professional as soon as possible: -allergic reactions like skin rash, itching or hives, swelling of the face, lips, or tongue -low blood counts - this medicine may decrease the number of white blood cells, red blood cells and platelets. You may be at increased risk for infections and bleeding. -redness, blistering, peeling or loosening of the skin, including inside the mouth -signs of infection - fever or chills, cough, sore throat, pain or difficulty passing urine -signs of decreased platelets or bleeding - bruising, pinpoint red spots on the skin, black, tarry stools, blood in the urine -signs  of decreased red blood cells - unusually weak or tired, fainting spells, lightheadedness -signs and symptoms of kidney injury like trouble passing urine or change in the amount of urine -signs and  symptoms of liver injury like dark yellow or brown urine; general ill feeling or flu-like symptoms; light-colored stools; loss of appetite; nausea; right upper belly pain; unusually weak or tired; yellowing of the eyes or skin Side effects that usually do not require medical attention (report to your doctor or health care professional if they continue or are bothersome): -constipation -decreased appetite -diarrhea -headache -mouth sores -nausea/vomiting -tiredness Where should I keep my medicine? This drug is given in a hospital or clinic and will not be stored at home.  2017 Elsevier/Gold Standard (2014-11-22 08:45:41)

## 2016-03-18 DIAGNOSIS — R05 Cough: Secondary | ICD-10-CM | POA: Diagnosis not present

## 2016-03-19 ENCOUNTER — Telehealth: Payer: Self-pay | Admitting: *Deleted

## 2016-03-19 NOTE — Telephone Encounter (Signed)
Complete

## 2016-04-03 ENCOUNTER — Ambulatory Visit (HOSPITAL_COMMUNITY)
Admission: RE | Admit: 2016-04-03 | Discharge: 2016-04-03 | Disposition: A | Discharge status: Home / Self Care | Payer: 59 | Source: Ambulatory Visit | Attending: Hematology & Oncology | Admitting: Hematology & Oncology

## 2016-04-03 DIAGNOSIS — C8223 Follicular lymphoma grade III, unspecified, intra-abdominal lymph nodes: Secondary | ICD-10-CM | POA: Insufficient documentation

## 2016-04-03 LAB — GLUCOSE, CAPILLARY: GLUCOSE-CAPILLARY: 128 mg/dL — AB (ref 65–99)

## 2016-04-03 MED ORDER — FLUDEOXYGLUCOSE F - 18 (FDG) INJECTION
11.7600 | Freq: Once | INTRAVENOUS | Status: AC | PRN
  Administered 2016-04-03: 11.76 via INTRAVENOUS

## 2016-04-09 ENCOUNTER — Ambulatory Visit (HOSPITAL_BASED_OUTPATIENT_CLINIC_OR_DEPARTMENT_OTHER): Payer: 59 | Admitting: Hematology & Oncology

## 2016-04-09 ENCOUNTER — Other Ambulatory Visit: Payer: 59

## 2016-04-09 ENCOUNTER — Other Ambulatory Visit (HOSPITAL_BASED_OUTPATIENT_CLINIC_OR_DEPARTMENT_OTHER): Payer: 59

## 2016-04-09 ENCOUNTER — Encounter: Payer: Self-pay | Admitting: Hematology & Oncology

## 2016-04-09 ENCOUNTER — Ambulatory Visit (HOSPITAL_BASED_OUTPATIENT_CLINIC_OR_DEPARTMENT_OTHER): Payer: 59

## 2016-04-09 VITALS — BP 125/81 | HR 99 | Temp 98.2°F | Resp 16 | Ht 71.0 in | Wt 241.0 lb

## 2016-04-09 DIAGNOSIS — C8223 Follicular lymphoma grade III, unspecified, intra-abdominal lymph nodes: Secondary | ICD-10-CM

## 2016-04-09 DIAGNOSIS — Z5111 Encounter for antineoplastic chemotherapy: Secondary | ICD-10-CM

## 2016-04-09 LAB — CMP (CANCER CENTER ONLY)
ALBUMIN: 3.4 g/dL (ref 3.3–5.5)
ALT(SGPT): 26 U/L (ref 10–47)
AST: 27 U/L (ref 11–38)
Alkaline Phosphatase: 109 U/L — ABNORMAL HIGH (ref 26–84)
BUN, Bld: 9 mg/dL (ref 7–22)
CALCIUM: 9.4 mg/dL (ref 8.0–10.3)
CHLORIDE: 103 meq/L (ref 98–108)
CO2: 27 meq/L (ref 18–33)
Creat: 0.9 mg/dl (ref 0.6–1.2)
GLUCOSE: 153 mg/dL — AB (ref 73–118)
POTASSIUM: 4.1 meq/L (ref 3.3–4.7)
Sodium: 141 mEq/L (ref 128–145)
Total Bilirubin: 0.6 mg/dl (ref 0.20–1.60)
Total Protein: 6.9 g/dL (ref 6.4–8.1)

## 2016-04-09 LAB — CBC WITH DIFFERENTIAL (CANCER CENTER ONLY)
BASO#: 0 10*3/uL (ref 0.0–0.2)
BASO%: 0.2 % (ref 0.0–2.0)
EOS ABS: 0.2 10*3/uL (ref 0.0–0.5)
EOS%: 4.1 % (ref 0.0–7.0)
HEMATOCRIT: 38.5 % — AB (ref 38.7–49.9)
HGB: 13.3 g/dL (ref 13.0–17.1)
LYMPH#: 0.7 10*3/uL — AB (ref 0.9–3.3)
LYMPH%: 13.6 % — ABNORMAL LOW (ref 14.0–48.0)
MCH: 31.3 pg (ref 28.0–33.4)
MCHC: 34.5 g/dL (ref 32.0–35.9)
MCV: 91 fL (ref 82–98)
MONO#: 0.5 10*3/uL (ref 0.1–0.9)
MONO%: 9.9 % (ref 0.0–13.0)
NEUT#: 3.7 10*3/uL (ref 1.5–6.5)
NEUT%: 72.2 % (ref 40.0–80.0)
Platelets: 220 10*3/uL (ref 145–400)
RBC: 4.25 10*6/uL (ref 4.20–5.70)
RDW: 13.6 % (ref 11.1–15.7)
WBC: 5.2 10*3/uL (ref 4.0–10.0)

## 2016-04-09 LAB — LACTATE DEHYDROGENASE: LDH: 157 U/L (ref 125–245)

## 2016-04-09 MED ORDER — PALONOSETRON HCL INJECTION 0.25 MG/5ML
INTRAVENOUS | Status: AC
  Filled 2016-04-09: qty 5

## 2016-04-09 MED ORDER — SODIUM CHLORIDE 0.9 % IV SOLN: 90.0000 mg/m2 | Freq: Once | INTRAVENOUS | Status: DC

## 2016-04-09 MED ORDER — BENDAMUSTINE HCL (LYOPHILIZED PWD) CHEMO INJECTION 100MG
86.0000 mg/m2 | Freq: Once | INTRAVENOUS | Status: AC
  Administered 2016-04-09: 200 mg via INTRAVENOUS
  Filled 2016-04-09: qty 40

## 2016-04-09 MED ORDER — DEXAMETHASONE SODIUM PHOSPHATE 10 MG/ML IJ SOLN
10.0000 mg | Freq: Once | INTRAMUSCULAR | Status: AC
  Administered 2016-04-09: 10 mg via INTRAVENOUS

## 2016-04-09 MED ORDER — DEXAMETHASONE SODIUM PHOSPHATE 10 MG/ML IJ SOLN
INTRAMUSCULAR | Status: AC
  Filled 2016-04-09: qty 1

## 2016-04-09 MED ORDER — SODIUM CHLORIDE 0.9 % IV SOLN
Freq: Once | INTRAVENOUS | Status: AC
  Administered 2016-04-09: 10:00:00 via INTRAVENOUS

## 2016-04-09 MED ORDER — PALONOSETRON HCL INJECTION 0.25 MG/5ML
0.2500 mg | Freq: Once | INTRAVENOUS | Status: AC
  Administered 2016-04-09: 0.25 mg via INTRAVENOUS

## 2016-04-09 NOTE — Patient Instructions (Signed)

## 2016-04-09 NOTE — Progress Notes (Signed)
Hematology and Oncology Follow Up Visit  James Jennings [id number] 07-01-1970 46 y.o. 04/09/2016   Principle Diagnosis:  Follicular large cell non-Hodgkin's lymphoma (FLIPI-2 = 0)  Current Therapy:   Rituxan/bendamustine-s/p cycle #2  (Will hold Rituxan today)    Interim History:  James Jennings is back for follow-up. He looks quite good. He feels good. We held Rituxan with his second cycle of treatment. I'm not sure if he had a delayed reaction to Rituxan that cost him to be in the ICU after his first cycle.  We did go ahead and do a PET scan on him. The PET scan showed resolution of lymphadenopathy and activity in his retroperitoneum. There was some activity localized and some mediastinal calcified lymph nodes.  He has had no problems with fever. He's had a problem with abdominal pain. He's had no cough. He's had no chest wall pain. He's had no change in bowel or bladder habits.   Overall, his performance status is ECOG 0.  Medications:  Allergies as of 04/09/2016      Reactions   Shellfish Allergy Anaphylaxis   Lactose Intolerance (gi) Diarrhea   Mango Flavor Swelling, Other (See Comments)   MANGO(S) LIPS SWELL      Medication List       Accurate as of 04/09/16  9:22 AM. Always use your most recent med list.          acetaminophen 325 MG tablet Commonly known as:  TYLENOL Take 650 mg by mouth every 6 (six) hours as needed for moderate pain or headache.   cetirizine 10 MG tablet Commonly known as:  ZYRTEC Take 10 mg by mouth daily as needed for allergies.   dexamethasone 4 MG tablet Commonly known as:  DECADRON Take 2 tablets (8 mg total) by mouth daily. Start the day after bendamustine chemotherapy for 2 days. Take with food.   famciclovir 500 MG tablet Commonly known as:  FAMVIR Take 1 tablet (500 mg total) by mouth daily.   LORazepam 0.5 MG tablet Commonly known as:  ATIVAN Take 1 tablet (0.5 mg total) by mouth every 6 (six) hours as needed (Nausea or vomiting).    multivitamin Tabs tablet Take 1 tablet by mouth daily.   ondansetron 8 MG tablet Commonly known as:  ZOFRAN Start on day 2 after bendamustine chemotherapy.   prochlorperazine 10 MG tablet Commonly known as:  COMPAZINE Take 1 tablet (10 mg total) by mouth every 6 (six) hours as needed (Nausea or vomiting).       Allergies:  Allergies  Allergen Reactions  . Shellfish Allergy Anaphylaxis  . Lactose Intolerance (Gi) Diarrhea  . Mango Flavor Swelling and Other (See Comments)    MANGO(S) LIPS SWELL    Past Medical History, Surgical history, Social history, and Family History were reviewed and updated.  Review of Systems: All other 10 point review of systems is negative.   Physical Exam:  height is 5\' 11"  (1.803 m) and weight is 241 lb (109.3 kg). His oral temperature is 98.2 F (36.8 C). His blood pressure is 125/81 and his pulse is 99. His respiration is 16.   Wt Readings from Last 3 Encounters:  04/09/16 241 lb (109.3 kg)  03/12/16 237 lb (107.5 kg)  03/01/16 236 lb 4.8 oz (107.2 kg)    Well-developed well-nourished white male. Head and neck exam shows no ocular or oral lesions. He has no palpable adenopathy in the neck. Thyroid is nonpalpable. Lungs are clear bilaterally. Cardiac exam regular rate and rhythm  with no murmurs, rubs or bruits. Abdomen is soft. Has good bowel sounds. There is no fluid wave. There is no palpable liver or spleen tip. Back exam shows no tenderness over the spine, ribs or hips. Extremities shows no clubbing, cyanosis or edema. Neurological exam shows no focal neurological deficits. Skin exam shows no rashes, ecchymosis or petechia.  Lab Results  Component Value Date   WBC 5.2 04/09/2016   HGB 13.3 04/09/2016   HCT 38.5 (L) 04/09/2016   MCV 91 04/09/2016   PLT 220 04/09/2016   No results found for: FERRITIN, IRON, TIBC, UIBC, IRONPCTSAT Lab Results  Component Value Date   RETICCTPCT 1.07 12/06/2015   RBC 4.25 04/09/2016   RETICCTABS 48.79  12/06/2015   No results found for: Nils P[facility] Lab Results  Component Value Date   IGGSERUM 729 02/26/2016   IGMSERUM 81 02/26/2016   No results found for: Odetta Pink, SPEI   Chemistry      Component Value Date/Time   NA 141 04/09/2016 0819   NA 141 02/07/2016 1149   K 4.1 04/09/2016 0819   K 4.3 02/07/2016 1149   CL 103 04/09/2016 0819   CO2 27 04/09/2016 0819   CO2 27 02/07/2016 1149   BUN 9 04/09/2016 0819   BUN 10.9 02/07/2016 1149   CREATININE 0.9 04/09/2016 0819   CREATININE 0.9 02/07/2016 1149      Component Value Date/Time   CALCIUM 9.4 04/09/2016 0819   CALCIUM 9.6 02/07/2016 1149   ALKPHOS 109 (H) 04/09/2016 0819   ALKPHOS 127 02/07/2016 1149   AST 27 04/09/2016 0819   AST 17 02/07/2016 1149   ALT 26 04/09/2016 0819   ALT 16 02/07/2016 1149   BILITOT 0.60 04/09/2016 0819   BILITOT 0.42 02/07/2016 1149     Impression and Plan: James Jennings is a pleasant 46 yo white male with follicular large cell non-Hodgkin's lymphoma.   Since he has had such a good response to treatment, I think we will hold the Rituxan. I just do not want to risk another potential reaction. Again are not sure if Rituxan caused him to have the hypotension and possible sepsis. I think that since his responsive been so good, we can go ahead and just treat with bendamustine.  We will treat him today. We will then get him back in one month. If I still plan for a total of 6 cycles of treatment. I will repeat the PET scan after cycle 4.  I spent about 25 minutes with he and his wife. I showed him the PET scan and went over the results   [name], MD 3/8/20189:22 AM

## 2016-04-10 ENCOUNTER — Ambulatory Visit (HOSPITAL_BASED_OUTPATIENT_CLINIC_OR_DEPARTMENT_OTHER): Payer: 59

## 2016-04-10 VITALS — BP 126/81 | HR 85 | Temp 97.7°F | Resp 18

## 2016-04-10 DIAGNOSIS — C8223 Follicular lymphoma grade III, unspecified, intra-abdominal lymph nodes: Secondary | ICD-10-CM | POA: Diagnosis not present

## 2016-04-10 DIAGNOSIS — Z5111 Encounter for antineoplastic chemotherapy: Secondary | ICD-10-CM | POA: Diagnosis not present

## 2016-04-10 LAB — BETA 2 MICROGLOBULIN, SERUM: BETA 2: 1.4 mg/L (ref 0.6–2.4)

## 2016-04-10 MED ORDER — DEXAMETHASONE SODIUM PHOSPHATE 10 MG/ML IJ SOLN
INTRAMUSCULAR | Status: AC
  Filled 2016-04-10: qty 1

## 2016-04-10 MED ORDER — SODIUM CHLORIDE 0.9 % IV SOLN
200.0000 mg | Freq: Once | INTRAVENOUS | Status: AC
  Administered 2016-04-10: 200 mg via INTRAVENOUS
  Filled 2016-04-10: qty 500

## 2016-04-10 MED ORDER — SODIUM CHLORIDE 0.9 % IV SOLN
Freq: Once | INTRAVENOUS | Status: AC
  Administered 2016-04-10: 09:00:00 via INTRAVENOUS

## 2016-04-10 MED ORDER — DEXAMETHASONE SODIUM PHOSPHATE 10 MG/ML IJ SOLN
10.0000 mg | Freq: Once | INTRAMUSCULAR | Status: AC
  Administered 2016-04-10: 10 mg via INTRAVENOUS

## 2016-04-17 ENCOUNTER — Other Ambulatory Visit: Payer: Self-pay | Admitting: *Deleted

## 2016-04-17 DIAGNOSIS — C8223 Follicular lymphoma grade III, unspecified, intra-abdominal lymph nodes: Secondary | ICD-10-CM

## 2016-04-17 MED ORDER — FAMCICLOVIR 500 MG PO TABS: 500.0000 mg | ORAL_TABLET | Freq: Every day | ORAL | 12 refills | Status: DC

## 2016-04-27 DIAGNOSIS — M25571 Pain in right ankle and joints of right foot: Secondary | ICD-10-CM | POA: Diagnosis not present

## 2016-04-27 DIAGNOSIS — R509 Fever, unspecified: Secondary | ICD-10-CM | POA: Diagnosis not present

## 2016-04-27 DIAGNOSIS — J069 Acute upper respiratory infection, unspecified: Secondary | ICD-10-CM | POA: Diagnosis not present

## 2016-05-04 DIAGNOSIS — J069 Acute upper respiratory infection, unspecified: Secondary | ICD-10-CM | POA: Diagnosis not present

## 2016-05-07 ENCOUNTER — Ambulatory Visit (HOSPITAL_BASED_OUTPATIENT_CLINIC_OR_DEPARTMENT_OTHER): Payer: 59 | Admitting: Hematology & Oncology

## 2016-05-07 ENCOUNTER — Other Ambulatory Visit (HOSPITAL_BASED_OUTPATIENT_CLINIC_OR_DEPARTMENT_OTHER): Payer: 59

## 2016-05-07 ENCOUNTER — Ambulatory Visit (HOSPITAL_BASED_OUTPATIENT_CLINIC_OR_DEPARTMENT_OTHER)
Admission: RE | Admit: 2016-05-07 | Discharge: 2016-05-07 | Disposition: A | Discharge status: Home / Self Care | Payer: 59 | Source: Ambulatory Visit | Attending: Hematology & Oncology | Admitting: Hematology & Oncology

## 2016-05-07 ENCOUNTER — Ambulatory Visit (HOSPITAL_BASED_OUTPATIENT_CLINIC_OR_DEPARTMENT_OTHER): Payer: 59

## 2016-05-07 VITALS — BP 118/65 | HR 86 | Temp 98.2°F | Resp 18 | Wt 249.0 lb

## 2016-05-07 DIAGNOSIS — C8223 Follicular lymphoma grade III, unspecified, intra-abdominal lymph nodes: Secondary | ICD-10-CM | POA: Diagnosis not present

## 2016-05-07 DIAGNOSIS — R05 Cough: Secondary | ICD-10-CM

## 2016-05-07 DIAGNOSIS — Z5111 Encounter for antineoplastic chemotherapy: Secondary | ICD-10-CM | POA: Diagnosis not present

## 2016-05-07 DIAGNOSIS — C8290 Follicular lymphoma, unspecified, unspecified site: Secondary | ICD-10-CM

## 2016-05-07 DIAGNOSIS — R053 Chronic cough: Secondary | ICD-10-CM

## 2016-05-07 DIAGNOSIS — J209 Acute bronchitis, unspecified: Secondary | ICD-10-CM

## 2016-05-07 LAB — CBC WITH DIFFERENTIAL (CANCER CENTER ONLY)
BASO#: 0 10*3/uL (ref 0.0–0.2)
BASO%: 0.2 % (ref 0.0–2.0)
EOS%: 4.3 % (ref 0.0–7.0)
Eosinophils Absolute: 0.3 10*3/uL (ref 0.0–0.5)
HCT: 39.5 % (ref 38.7–49.9)
HEMOGLOBIN: 13.7 g/dL (ref 13.0–17.1)
LYMPH#: 0.7 10*3/uL — ABNORMAL LOW (ref 0.9–3.3)
LYMPH%: 12 % — AB (ref 14.0–48.0)
MCH: 31.6 pg (ref 28.0–33.4)
MCHC: 34.7 g/dL (ref 32.0–35.9)
MCV: 91 fL (ref 82–98)
MONO#: 0.7 10*3/uL (ref 0.1–0.9)
MONO%: 11.4 % (ref 0.0–13.0)
NEUT%: 72.1 % (ref 40.0–80.0)
NEUTROS ABS: 4.2 10*3/uL (ref 1.5–6.5)
PLATELETS: 229 10*3/uL (ref 145–400)
RBC: 4.33 10*6/uL (ref 4.20–5.70)
RDW: 13.7 % (ref 11.1–15.7)
WBC: 5.8 10*3/uL (ref 4.0–10.0)

## 2016-05-07 LAB — CMP (CANCER CENTER ONLY)
ALT(SGPT): 24 U/L (ref 10–47)
AST: 28 U/L (ref 11–38)
Albumin: 3.4 g/dL (ref 3.3–5.5)
Alkaline Phosphatase: 113 U/L — ABNORMAL HIGH (ref 26–84)
BUN: 9 mg/dL (ref 7–22)
CHLORIDE: 105 meq/L (ref 98–108)
CO2: 28 meq/L (ref 18–33)
Calcium: 9.4 mg/dL (ref 8.0–10.3)
Creat: 1.1 mg/dl (ref 0.6–1.2)
Glucose, Bld: 133 mg/dL — ABNORMAL HIGH (ref 73–118)
POTASSIUM: 3.9 meq/L (ref 3.3–4.7)
Sodium: 141 mEq/L (ref 128–145)
TOTAL PROTEIN: 6.9 g/dL (ref 6.4–8.1)
Total Bilirubin: 0.6 mg/dl (ref 0.20–1.60)

## 2016-05-07 LAB — LACTATE DEHYDROGENASE: LDH: 181 U/L (ref 125–245)

## 2016-05-07 MED ORDER — CEFDINIR 300 MG PO CAPS: 600.0000 mg | ORAL_CAPSULE | Freq: Every day | ORAL | 0 refills | Status: DC

## 2016-05-07 MED ORDER — DEXTROSE 5 % IV SOLN
2.0000 g | INTRAVENOUS | Status: DC
  Administered 2016-05-07: 2 g via INTRAVENOUS
  Filled 2016-05-07: qty 2

## 2016-05-07 MED ORDER — PALONOSETRON HCL INJECTION 0.25 MG/5ML
INTRAVENOUS | Status: AC
  Filled 2016-05-07: qty 5

## 2016-05-07 MED ORDER — DEXAMETHASONE SODIUM PHOSPHATE 10 MG/ML IJ SOLN
10.0000 mg | Freq: Once | INTRAMUSCULAR | Status: AC
  Administered 2016-05-07: 10 mg via INTRAVENOUS

## 2016-05-07 MED ORDER — SODIUM CHLORIDE 0.9 % IV SOLN
Freq: Once | INTRAVENOUS | Status: AC
  Administered 2016-05-07: 11:00:00 via INTRAVENOUS

## 2016-05-07 MED ORDER — DEXAMETHASONE SODIUM PHOSPHATE 10 MG/ML IJ SOLN
INTRAMUSCULAR | Status: AC
  Filled 2016-05-07: qty 1

## 2016-05-07 MED ORDER — PALONOSETRON HCL INJECTION 0.25 MG/5ML
0.2500 mg | Freq: Once | INTRAVENOUS | Status: AC
  Administered 2016-05-07: 0.25 mg via INTRAVENOUS

## 2016-05-07 MED ORDER — SODIUM CHLORIDE 0.9 % IV SOLN
200.0000 mg | Freq: Once | INTRAVENOUS | Status: AC
  Administered 2016-05-07: 200 mg via INTRAVENOUS
  Filled 2016-05-07: qty 500

## 2016-05-07 NOTE — Patient Instructions (Signed)
[facility] Discharge Instructions for Patients Receiving Chemotherapy  Today you received the following chemotherapy agents Bendeka and Rocephin.  To help prevent nausea and vomiting after your treatment, we encourage you to take your nausea medication as directed. NO ZOFRAN FOR 2 MORE DAYS - TAKE COMPAZINE INSTEAD.   If you develop nausea and vomiting that is not controlled by your nausea medication, call the clinic.   BELOW ARE SYMPTOMS THAT SHOULD BE REPORTED IMMEDIATELY:  *FEVER GREATER THAN 100.5 F  *CHILLS WITH OR WITHOUT FEVER  NAUSEA AND VOMITING THAT IS NOT CONTROLLED WITH YOUR NAUSEA MEDICATION  *UNUSUAL SHORTNESS OF BREATH  *UNUSUAL BRUISING OR BLEEDING  TENDERNESS IN MOUTH AND THROAT WITH OR WITHOUT PRESENCE OF ULCERS  *URINARY PROBLEMS  *BOWEL PROBLEMS  UNUSUAL RASH Items with * indicate a potential emergency and should be followed up as soon as possible.  Feel free to call the clinic you have any questions or concerns. The clinic phone number is [phone].  Please show the [facility] at check-in to the Emergency Department and triage nurse.

## 2016-05-07 NOTE — Progress Notes (Signed)
Hematology and Oncology Follow Up Visit  James Jennings [id number] 06/13/1970 46 y.o. 05/07/2016   Principle Diagnosis:  Follicular large cell non-Hodgkin's lymphoma (FLIPI-2 = 0)  Current Therapy:   Rituxan/bendamustine-s/p cycle #3  (Will hold Rituxan today)    Interim History:  James Jennings is back for follow-up. He looks quite good. He feels good. His only complaint is that he's had this cough. According to his wife, he's had a cough for about 3 weeks. It is non-productive. He's had no fever.  We will go ahead and get a chest x-ray on him.  He did well with his cycle #2 of treatment. We are holding the Rituxan for right now. He has done well with the bendamustine.  He has had no problems with bowels or bladder. He's had no mouth sores. He's had no headache. He's had no leg swelling. He's had no rashes.  He and his family had a nice Easter..   Overall, his performance status is ECOG 0.  Medications:  Allergies as of 05/07/2016      Reactions   Shellfish Allergy Anaphylaxis   Lactose Intolerance (gi) Diarrhea   Mango Flavor Swelling, Other (See Comments)   MANGO(S) LIPS SWELL      Medication List       Accurate as of 05/07/16  5:52 PM. Always use your most recent med list.          acetaminophen 325 MG tablet Commonly known as:  TYLENOL Take 650 mg by mouth every 6 (six) hours as needed for moderate pain or headache.   benzonatate 100 MG capsule Commonly known as:  TESSALON Take 100 mg by mouth 3 (three) times daily.   cetirizine 10 MG tablet Commonly known as:  ZYRTEC Take 10 mg by mouth daily as needed for allergies.   dexamethasone 4 MG tablet Commonly known as:  DECADRON Take 2 tablets (8 mg total) by mouth daily. Start the day after bendamustine chemotherapy for 2 days. Take with food.   famciclovir 500 MG tablet Commonly known as:  FAMVIR Take 1 tablet (500 mg total) by mouth daily.   LORazepam 0.5 MG tablet Commonly known as:  ATIVAN Take 1 tablet (0.5 mg  total) by mouth every 6 (six) hours as needed (Nausea or vomiting).   multivitamin Tabs tablet Take 1 tablet by mouth daily.   ondansetron 8 MG tablet Commonly known as:  ZOFRAN Start on day 2 after bendamustine chemotherapy.   prochlorperazine 10 MG tablet Commonly known as:  COMPAZINE Take 1 tablet (10 mg total) by mouth every 6 (six) hours as needed (Nausea or vomiting).       Allergies:  Allergies  Allergen Reactions  . Shellfish Allergy Anaphylaxis  . Lactose Intolerance (Gi) Diarrhea  . Mango Flavor Swelling and Other (See Comments)    MANGO(S) LIPS SWELL    Past Medical History, Surgical history, Social history, and Family History were reviewed and updated.  Review of Systems: All other 10 point review of systems is negative.   Physical Exam:  weight is 249 lb (112.9 kg). His oral temperature is 98.2 F (36.8 C). His blood pressure is 118/65 and his pulse is 86. His respiration is 18 and oxygen saturation is 96%.   Wt Readings from Last 3 Encounters:  05/07/16 249 lb (112.9 kg)  04/09/16 241 lb (109.3 kg)  03/12/16 237 lb (107.5 kg)    Well-developed well-nourished white male. Head and neck exam shows no ocular or oral lesions. He has no palpable  adenopathy in the neck. Thyroid is nonpalpable. Lungs are clear bilaterally. Cardiac exam regular rate and rhythm with no murmurs, rubs or bruits. Abdomen is soft. Has good bowel sounds. There is no fluid wave. There is no palpable liver or spleen tip. Back exam shows no tenderness over the spine, ribs or hips. Extremities shows no clubbing, cyanosis or edema. Neurological exam shows no focal neurological deficits. Skin exam shows no rashes, ecchymosis or petechia.  Lab Results  Component Value Date   WBC 5.8 05/07/2016   HGB 13.7 05/07/2016   HCT 39.5 05/07/2016   MCV 91 05/07/2016   PLT 229 05/07/2016   No results found for: FERRITIN, IRON, TIBC, UIBC, IRONPCTSAT Lab Results  Component Value Date   RETICCTPCT  1.07 12/06/2015   RBC 4.33 05/07/2016   RETICCTABS 48.79 12/06/2015   No results found for: Nils Pyle [facility] Lab Results  Component Value Date   IGGSERUM 729 02/26/2016   IGMSERUM 81 02/26/2016   No results found for: Odetta Pink, SPEI   Chemistry      Component Value Date/Time   NA 141 05/07/2016 0840   NA 141 02/07/2016 1149   K 3.9 05/07/2016 0840   K 4.3 02/07/2016 1149   CL 105 05/07/2016 0840   CO2 28 05/07/2016 0840   CO2 27 02/07/2016 1149   BUN 9 05/07/2016 0840   BUN 10.9 02/07/2016 1149   CREATININE 1.1 05/07/2016 0840   CREATININE 0.9 02/07/2016 1149      Component Value Date/Time   CALCIUM 9.4 05/07/2016 0840   CALCIUM 9.6 02/07/2016 1149   ALKPHOS 113 (H) 05/07/2016 0840   ALKPHOS 127 02/07/2016 1149   AST 28 05/07/2016 0840   AST 17 02/07/2016 1149   ALT 24 05/07/2016 0840   ALT 16 02/07/2016 1149   BILITOT 0.60 05/07/2016 0840   BILITOT 0.42 02/07/2016 1149     Impression and Plan: [name] is a pleasant 46 yo white male with follicular large cell non-Hodgkin's lymphoma.   We did get the chest x-ray on him. Thankfully, there is no obvious pneumonia. I think he probably has some bronchitis. Given his chemotherapy, I do believe that he has some amino suppression. I feel that we have to treat him with antibiotics. I will give him antibiotics with Rocephin today and tomorrow. I will then put him on some oral antibiotics over the weekend.   We will proceed with his fourth cycle of treatment.  I don't see that we have to do any scans on him until after the sixth cycle.  We will plan to get him back in one month. He knows that he gets sore come back sooner if he has any issues.   [name], MD 4/5/20185:52 PM

## 2016-05-08 ENCOUNTER — Ambulatory Visit (HOSPITAL_BASED_OUTPATIENT_CLINIC_OR_DEPARTMENT_OTHER): Payer: 59

## 2016-05-08 VITALS — BP 129/83 | HR 85 | Temp 97.5°F | Resp 17

## 2016-05-08 DIAGNOSIS — C8223 Follicular lymphoma grade III, unspecified, intra-abdominal lymph nodes: Secondary | ICD-10-CM

## 2016-05-08 DIAGNOSIS — R05 Cough: Secondary | ICD-10-CM | POA: Diagnosis not present

## 2016-05-08 DIAGNOSIS — Z5111 Encounter for antineoplastic chemotherapy: Secondary | ICD-10-CM

## 2016-05-08 DIAGNOSIS — C8290 Follicular lymphoma, unspecified, unspecified site: Secondary | ICD-10-CM

## 2016-05-08 LAB — BETA 2 MICROGLOBULIN, SERUM: BETA 2: 1.8 mg/L (ref 0.6–2.4)

## 2016-05-08 MED ORDER — DEXAMETHASONE SODIUM PHOSPHATE 10 MG/ML IJ SOLN
10.0000 mg | Freq: Once | INTRAMUSCULAR | Status: AC
  Administered 2016-05-08: 10 mg via INTRAVENOUS

## 2016-05-08 MED ORDER — DEXAMETHASONE SODIUM PHOSPHATE 10 MG/ML IJ SOLN
INTRAMUSCULAR | Status: AC
  Filled 2016-05-08: qty 1

## 2016-05-08 MED ORDER — BENDAMUSTINE HCL (LYOPHILIZED PWD) CHEMO INJECTION 100MG
200.0000 mg | Freq: Once | INTRAVENOUS | Status: AC
  Administered 2016-05-08: 200 mg via INTRAVENOUS
  Filled 2016-05-08: qty 500

## 2016-05-08 MED ORDER — SODIUM CHLORIDE 0.9 % IV SOLN
Freq: Once | INTRAVENOUS | Status: AC
  Administered 2016-05-08: 10:00:00 via INTRAVENOUS

## 2016-05-08 MED ORDER — DEXTROSE 5 % IV SOLN
2.0000 g | Freq: Once | INTRAVENOUS | Status: AC
  Administered 2016-05-08: 2 g via INTRAVENOUS
  Filled 2016-05-08: qty 2

## 2016-05-08 NOTE — Patient Instructions (Signed)
[facility] Discharge Instructions for Patients Receiving Chemotherapy  Today you received the following chemotherapy agents Bendeka and Rocephin.  To help prevent nausea and vomiting after your treatment, we encourage you to take your nausea medication as directed. NO ZOFRAN UNTIL SUNDAY- TAKE COMPAZINE INSTEAD.   If you develop nausea and vomiting that is not controlled by your nausea medication, call the clinic.   BELOW ARE SYMPTOMS THAT SHOULD BE REPORTED IMMEDIATELY:  *FEVER GREATER THAN 100.5 F  *CHILLS WITH OR WITHOUT FEVER  NAUSEA AND VOMITING THAT IS NOT CONTROLLED WITH YOUR NAUSEA MEDICATION  *UNUSUAL SHORTNESS OF BREATH  *UNUSUAL BRUISING OR BLEEDING  TENDERNESS IN MOUTH AND THROAT WITH OR WITHOUT PRESENCE OF ULCERS  *URINARY PROBLEMS  *BOWEL PROBLEMS  UNUSUAL RASH Items with * indicate a potential emergency and should be followed up as soon as possible.  Feel free to call the clinic you have any questions or concerns. The clinic phone number is [phone].  Please show the [facility] at check-in to the Emergency Department and triage nurse.

## 2016-05-13 ENCOUNTER — Telehealth: Payer: Self-pay | Admitting: *Deleted

## 2016-05-13 NOTE — Telephone Encounter (Signed)
Patient reporting low grade temp of 99.6. He just had treatment last week. He often has low grade temps the week after treatment. He has no other symptoms or concerns.  Spoke to Dr Marin Olp. He wants to observe and patient to use tylenol to treat. If he develops any new symptoms or his fever rises, he is to call the office back. Patient understands all instructions.

## 2016-05-22 DIAGNOSIS — M25571 Pain in right ankle and joints of right foot: Secondary | ICD-10-CM | POA: Diagnosis not present

## 2016-06-04 ENCOUNTER — Other Ambulatory Visit (HOSPITAL_BASED_OUTPATIENT_CLINIC_OR_DEPARTMENT_OTHER): Payer: 59

## 2016-06-04 ENCOUNTER — Ambulatory Visit (HOSPITAL_BASED_OUTPATIENT_CLINIC_OR_DEPARTMENT_OTHER): Payer: 59

## 2016-06-04 ENCOUNTER — Other Ambulatory Visit: Payer: Self-pay | Admitting: Hematology & Oncology

## 2016-06-04 ENCOUNTER — Ambulatory Visit (HOSPITAL_BASED_OUTPATIENT_CLINIC_OR_DEPARTMENT_OTHER): Payer: 59 | Admitting: Hematology & Oncology

## 2016-06-04 VITALS — BP 124/75 | HR 81 | Temp 98.1°F | Resp 18 | Wt 247.1 lb

## 2016-06-04 DIAGNOSIS — Z5111 Encounter for antineoplastic chemotherapy: Secondary | ICD-10-CM

## 2016-06-04 DIAGNOSIS — R05 Cough: Secondary | ICD-10-CM

## 2016-06-04 DIAGNOSIS — R053 Chronic cough: Secondary | ICD-10-CM

## 2016-06-04 DIAGNOSIS — C8223 Follicular lymphoma grade III, unspecified, intra-abdominal lymph nodes: Secondary | ICD-10-CM | POA: Diagnosis not present

## 2016-06-04 DIAGNOSIS — J209 Acute bronchitis, unspecified: Secondary | ICD-10-CM | POA: Diagnosis not present

## 2016-06-04 DIAGNOSIS — C8290 Follicular lymphoma, unspecified, unspecified site: Secondary | ICD-10-CM | POA: Diagnosis not present

## 2016-06-04 LAB — CBC WITH DIFFERENTIAL (CANCER CENTER ONLY)
BASO#: 0 10*3/uL (ref 0.0–0.2)
BASO%: 0.6 % (ref 0.0–2.0)
EOS ABS: 0.2 10*3/uL (ref 0.0–0.5)
EOS%: 4.7 % (ref 0.0–7.0)
HCT: 40.8 % (ref 38.7–49.9)
HEMOGLOBIN: 14.2 g/dL (ref 13.0–17.1)
LYMPH#: 0.6 10*3/uL — ABNORMAL LOW (ref 0.9–3.3)
LYMPH%: 11.4 % — AB (ref 14.0–48.0)
MCH: 31.8 pg (ref 28.0–33.4)
MCHC: 34.8 g/dL (ref 32.0–35.9)
MCV: 92 fL (ref 82–98)
MONO#: 0.6 10*3/uL (ref 0.1–0.9)
MONO%: 11.4 % (ref 0.0–13.0)
NEUT%: 71.9 % (ref 40.0–80.0)
NEUTROS ABS: 3.5 10*3/uL (ref 1.5–6.5)
PLATELETS: 228 10*3/uL (ref 145–400)
RBC: 4.46 10*6/uL (ref 4.20–5.70)
RDW: 13.5 % (ref 11.1–15.7)
WBC: 4.9 10*3/uL (ref 4.0–10.0)

## 2016-06-04 LAB — CMP (CANCER CENTER ONLY)
ALBUMIN: 3.7 g/dL (ref 3.3–5.5)
ALT(SGPT): 27 U/L (ref 10–47)
AST: 28 U/L (ref 11–38)
Alkaline Phosphatase: 109 U/L — ABNORMAL HIGH (ref 26–84)
BILIRUBIN TOTAL: 0.7 mg/dL (ref 0.20–1.60)
BUN: 11 mg/dL (ref 7–22)
CHLORIDE: 106 meq/L (ref 98–108)
CO2: 27 meq/L (ref 18–33)
CREATININE: 1 mg/dL (ref 0.6–1.2)
Calcium: 9.6 mg/dL (ref 8.0–10.3)
GLUCOSE: 108 mg/dL (ref 73–118)
Potassium: 3.9 mEq/L (ref 3.3–4.7)
SODIUM: 140 meq/L (ref 128–145)
Total Protein: 7.2 g/dL (ref 6.4–8.1)

## 2016-06-04 LAB — LACTATE DEHYDROGENASE: LDH: 173 U/L (ref 125–245)

## 2016-06-04 MED ORDER — SODIUM CHLORIDE 0.9 % IV SOLN
Freq: Once | INTRAVENOUS | Status: AC
  Administered 2016-06-04: 10:00:00 via INTRAVENOUS

## 2016-06-04 MED ORDER — SODIUM CHLORIDE 0.9 % IV SOLN
90.0000 mg/m2 | Freq: Once | INTRAVENOUS | Status: AC
  Administered 2016-06-04: 200 mg via INTRAVENOUS
  Filled 2016-06-04: qty 8

## 2016-06-04 MED ORDER — PALONOSETRON HCL INJECTION 0.25 MG/5ML
0.2500 mg | Freq: Once | INTRAVENOUS | Status: AC
  Administered 2016-06-04: 0.25 mg via INTRAVENOUS

## 2016-06-04 MED ORDER — DEXAMETHASONE SODIUM PHOSPHATE 10 MG/ML IJ SOLN
INTRAMUSCULAR | Status: AC
  Filled 2016-06-04: qty 1

## 2016-06-04 MED ORDER — PALONOSETRON HCL INJECTION 0.25 MG/5ML
INTRAVENOUS | Status: AC
  Filled 2016-06-04: qty 5

## 2016-06-04 MED ORDER — DEXAMETHASONE SODIUM PHOSPHATE 10 MG/ML IJ SOLN
10.0000 mg | Freq: Once | INTRAMUSCULAR | Status: AC
  Administered 2016-06-04: 10 mg via INTRAVENOUS

## 2016-06-04 NOTE — Patient Instructions (Signed)
[facility] Discharge Instructions for Patients Receiving Chemotherapy  Today you received the following chemotherapy agents Bendeka and Rocephin.  To help prevent nausea and vomiting after your treatment, we encourage you to take your nausea medication as directed. NO ZOFRAN UNTIL SUNDAY- TAKE COMPAZINE INSTEAD.   If you develop nausea and vomiting that is not controlled by your nausea medication, call the clinic.   BELOW ARE SYMPTOMS THAT SHOULD BE REPORTED IMMEDIATELY:  *FEVER GREATER THAN 100.5 F  *CHILLS WITH OR WITHOUT FEVER  NAUSEA AND VOMITING THAT IS NOT CONTROLLED WITH YOUR NAUSEA MEDICATION  *UNUSUAL SHORTNESS OF BREATH  *UNUSUAL BRUISING OR BLEEDING  TENDERNESS IN MOUTH AND THROAT WITH OR WITHOUT PRESENCE OF ULCERS  *URINARY PROBLEMS  *BOWEL PROBLEMS  UNUSUAL RASH Items with * indicate a potential emergency and should be followed up as soon as possible.  Feel free to call the clinic you have any questions or concerns. The clinic phone number is [phone].  Please show the [facility] at check-in to the Emergency Department and triage nurse.
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
## 2016-06-04 NOTE — Addendum Note (Signed)
Addended by: Burney Gauze R on: 06/04/2016 10:28 AM   Modules accepted: Orders

## 2016-06-05 ENCOUNTER — Ambulatory Visit (HOSPITAL_BASED_OUTPATIENT_CLINIC_OR_DEPARTMENT_OTHER): Payer: 59

## 2016-06-05 VITALS — BP 118/69 | HR 92 | Temp 98.0°F | Resp 17

## 2016-06-05 DIAGNOSIS — Z5111 Encounter for antineoplastic chemotherapy: Secondary | ICD-10-CM | POA: Diagnosis not present

## 2016-06-05 DIAGNOSIS — C8223 Follicular lymphoma grade III, unspecified, intra-abdominal lymph nodes: Secondary | ICD-10-CM | POA: Diagnosis not present

## 2016-06-05 LAB — BETA 2 MICROGLOBULIN, SERUM: Beta-2: 1.5 mg/L (ref 0.6–2.4)

## 2016-06-05 LAB — IGG, IGA, IGM
IGG (IMMUNOGLOBIN G), SERUM: 785 mg/dL (ref 700–1600)
IGM (IMMUNOGLOBIN M), SRM: 52 mg/dL (ref 20–172)
IgA, Qn, Serum: 186 mg/dL (ref 90–386)

## 2016-06-05 MED ORDER — DEXAMETHASONE SODIUM PHOSPHATE 10 MG/ML IJ SOLN
10.0000 mg | Freq: Once | INTRAMUSCULAR | Status: AC
  Administered 2016-06-05: 10 mg via INTRAVENOUS

## 2016-06-05 MED ORDER — DEXAMETHASONE SODIUM PHOSPHATE 10 MG/ML IJ SOLN
INTRAMUSCULAR | Status: AC
  Filled 2016-06-05: qty 1

## 2016-06-05 MED ORDER — SODIUM CHLORIDE 0.9 % IV SOLN
Freq: Once | INTRAVENOUS | Status: AC
  Administered 2016-06-05: 09:00:00 via INTRAVENOUS

## 2016-06-05 MED ORDER — BENDAMUSTINE HCL CHEMO INJECTION 100 MG/4ML
90.0000 mg/m2 | Freq: Once | INTRAVENOUS | Status: AC
  Administered 2016-06-05: 200 mg via INTRAVENOUS
  Filled 2016-06-05: qty 8

## 2016-06-05 NOTE — Patient Instructions (Signed)
[facility] Discharge Instructions for Patients Receiving Chemotherapy  Today you received the following chemotherapy agents Bendeka.   To help prevent nausea and vomiting after your treatment, we encourage you to take your nausea medication as directed.    If you develop nausea and vomiting that is not controlled by your nausea medication, call the clinic.   BELOW ARE SYMPTOMS THAT SHOULD BE REPORTED IMMEDIATELY:  *FEVER GREATER THAN 100.5 F  *CHILLS WITH OR WITHOUT FEVER  NAUSEA AND VOMITING THAT IS NOT CONTROLLED WITH YOUR NAUSEA MEDICATION  *UNUSUAL SHORTNESS OF BREATH  *UNUSUAL BRUISING OR BLEEDING  TENDERNESS IN MOUTH AND THROAT WITH OR WITHOUT PRESENCE OF ULCERS  *URINARY PROBLEMS  *BOWEL PROBLEMS  UNUSUAL RASH Items with * indicate a potential emergency and should be followed up as soon as possible.  Feel free to call the clinic you have any questions or concerns. The clinic phone number is [phone].  Please show the CHEMO ALERT CARD at check-in to the Emergency Department and triage nurse.   

## 2016-07-02 ENCOUNTER — Other Ambulatory Visit (HOSPITAL_BASED_OUTPATIENT_CLINIC_OR_DEPARTMENT_OTHER): Payer: 59

## 2016-07-02 ENCOUNTER — Ambulatory Visit (HOSPITAL_BASED_OUTPATIENT_CLINIC_OR_DEPARTMENT_OTHER): Payer: 59 | Admitting: Hematology & Oncology

## 2016-07-02 ENCOUNTER — Ambulatory Visit (HOSPITAL_BASED_OUTPATIENT_CLINIC_OR_DEPARTMENT_OTHER): Payer: 59

## 2016-07-02 VITALS — BP 122/83 | HR 93 | Temp 98.4°F | Resp 18 | Wt 252.0 lb

## 2016-07-02 DIAGNOSIS — C8223 Follicular lymphoma grade III, unspecified, intra-abdominal lymph nodes: Secondary | ICD-10-CM

## 2016-07-02 DIAGNOSIS — Z5111 Encounter for antineoplastic chemotherapy: Secondary | ICD-10-CM | POA: Diagnosis not present

## 2016-07-02 LAB — CBC WITH DIFFERENTIAL (CANCER CENTER ONLY)
BASO#: 0 10*3/uL (ref 0.0–0.2)
BASO%: 0.5 % (ref 0.0–2.0)
EOS%: 3.8 % (ref 0.0–7.0)
Eosinophils Absolute: 0.2 10*3/uL (ref 0.0–0.5)
HEMATOCRIT: 39.7 % (ref 38.7–49.9)
HEMOGLOBIN: 13.9 g/dL (ref 13.0–17.1)
LYMPH#: 0.5 10*3/uL — AB (ref 0.9–3.3)
LYMPH%: 11.1 % — AB (ref 14.0–48.0)
MCH: 32.3 pg (ref 28.0–33.4)
MCHC: 35 g/dL (ref 32.0–35.9)
MCV: 92 fL (ref 82–98)
MONO#: 0.6 10*3/uL (ref 0.1–0.9)
MONO%: 13.1 % — AB (ref 0.0–13.0)
NEUT#: 3.2 10*3/uL (ref 1.5–6.5)
NEUT%: 71.5 % (ref 40.0–80.0)
Platelets: 209 10*3/uL (ref 145–400)
RBC: 4.31 10*6/uL (ref 4.20–5.70)
RDW: 13.2 % (ref 11.1–15.7)
WBC: 4.4 10*3/uL (ref 4.0–10.0)

## 2016-07-02 LAB — CMP (CANCER CENTER ONLY)
ALT(SGPT): 29 U/L (ref 10–47)
AST: 26 U/L (ref 11–38)
Albumin: 3.5 g/dL (ref 3.3–5.5)
Alkaline Phosphatase: 100 U/L — ABNORMAL HIGH (ref 26–84)
BUN, Bld: 11 mg/dL (ref 7–22)
CO2: 27 meq/L (ref 18–33)
Calcium: 9 mg/dL (ref 8.0–10.3)
Chloride: 104 meq/L (ref 98–108)
Creat: 0.8 mg/dL (ref 0.6–1.2)
Glucose, Bld: 140 mg/dL — ABNORMAL HIGH (ref 73–118)
Potassium: 3.8 meq/L (ref 3.3–4.7)
Sodium: 138 meq/L (ref 128–145)
Total Bilirubin: 0.7 mg/dL (ref 0.20–1.60)
Total Protein: 6.8 g/dL (ref 6.4–8.1)

## 2016-07-02 LAB — LACTATE DEHYDROGENASE: LDH: 162 U/L (ref 125–245)

## 2016-07-02 MED ORDER — PALONOSETRON HCL INJECTION 0.25 MG/5ML
0.2500 mg | Freq: Once | INTRAVENOUS | Status: AC
  Administered 2016-07-02: 0.25 mg via INTRAVENOUS

## 2016-07-02 MED ORDER — SODIUM CHLORIDE 0.9 % IV SOLN
Freq: Once | INTRAVENOUS | Status: AC
Start: 2016-07-02 — End: 2016-07-02
  Administered 2016-07-02: 11:00:00 via INTRAVENOUS

## 2016-07-02 MED ORDER — PALONOSETRON HCL INJECTION 0.25 MG/5ML
INTRAVENOUS | Status: AC
  Filled 2016-07-02: qty 5

## 2016-07-02 MED ORDER — DEXAMETHASONE SODIUM PHOSPHATE 10 MG/ML IJ SOLN
10.0000 mg | Freq: Once | INTRAMUSCULAR | Status: AC
  Administered 2016-07-02: 10 mg via INTRAVENOUS

## 2016-07-02 MED ORDER — DEXAMETHASONE SODIUM PHOSPHATE 10 MG/ML IJ SOLN
INTRAMUSCULAR | Status: AC
  Filled 2016-07-02: qty 1

## 2016-07-02 MED ORDER — SODIUM CHLORIDE 0.9 % IV SOLN
90.0000 mg/m2 | Freq: Once | INTRAVENOUS | Status: AC
  Administered 2016-07-02: 200 mg via INTRAVENOUS
  Filled 2016-07-02: qty 8

## 2016-07-02 NOTE — Patient Instructions (Signed)

## 2016-07-02 NOTE — Progress Notes (Signed)
Hematology and Oncology Follow Up Visit  James Jennings [id number] May 30, 1970 46 y.o. 07/02/2016   Principle Diagnosis:  Follicular large cell non-Hodgkin's lymphoma (FLIPI-2 = 0)  Current Therapy:   Rituxan/bendamustine-s/p cycle #5 (Will hold Rituxan today)    Interim History:  Mr. Bunda is back for follow-up. He is done very well with treatment. He has had no problems with fever. He's had some nausea but no vomiting. Has been some fatigue.  He has been out to the [employer]. He's been working out there. He probably will go out there 3 or 4 times this summer.  He's had no rashes. He's had no cough. He is still smoking. He's going to try to stop smoking. He has Chantix to try and help him stop smoking.  Overall, his performance status is ECOG 0.  Medications:  Allergies as of 07/02/2016      Reactions   Shellfish Allergy Anaphylaxis   Lactose Intolerance (gi) Diarrhea   Mango Flavor Swelling, Other (See Comments)   MANGO(S) LIPS SWELL      Medication List       Accurate as of 07/02/16  9:59 AM. Always use your most recent med list.          acetaminophen 325 MG tablet Commonly known as:  TYLENOL Take 650 mg by mouth every 6 (six) hours as needed for moderate pain or headache.   cetirizine 10 MG tablet Commonly known as:  ZYRTEC Take 10 mg by mouth daily as needed for allergies.   dexamethasone 4 MG tablet Commonly known as:  DECADRON Take 2 tablets (8 mg total) by mouth daily. Start the day after bendamustine chemotherapy for 2 days. Take with food.   famciclovir 500 MG tablet Commonly known as:  FAMVIR Take 1 tablet (500 mg total) by mouth daily.   LORazepam 0.5 MG tablet Commonly known as:  ATIVAN TAKE 1 TABLET BY MOUTH EVERY 6 HOURS AS NEEDED FOR NAUSEA AND VOMITING   multivitamin Tabs tablet Take 1 tablet by mouth daily.   ondansetron 8 MG tablet Commonly known as:  ZOFRAN Start on day 2 after bendamustine chemotherapy.   prochlorperazine 10 MG  tablet Commonly known as:  COMPAZINE Take 1 tablet (10 mg total) by mouth every 6 (six) hours as needed (Nausea or vomiting).       Allergies:  Allergies  Allergen Reactions  . Shellfish Allergy Anaphylaxis  . Lactose Intolerance (Gi) Diarrhea  . Mango Flavor Swelling and Other (See Comments)    MANGO(S) LIPS SWELL    Past Medical History, Surgical history, Social history, and Family History were reviewed and updated.  Review of Systems: All other 10 point review of systems is negative.   Physical Exam:  weight is 252 lb (114.3 kg). His oral temperature is 98.4 F (36.9 C). His blood pressure is 122/83 and his pulse is 93. His respiration is 18 and oxygen saturation is 98%.   Wt Readings from Last 3 Encounters:  07/02/16 252 lb (114.3 kg)  06/04/16 247 lb 1.9 oz (112.1 kg)  05/07/16 249 lb (112.9 kg)    Well-developed well-nourished white male. Head and neck exam shows no ocular or oral lesions. He has no palpable adenopathy in the neck. Thyroid is nonpalpable. Lungs are clear bilaterally. Cardiac exam regular rate and rhythm with no murmurs, rubs or bruits. Abdomen is soft. Has good bowel sounds. There is no fluid wave. There is no palpable liver or spleen tip. Back exam shows no tenderness over the spine,  ribs or hips. Extremities shows no clubbing, cyanosis or edema. Neurological exam shows no focal neurological deficits. Skin exam shows no rashes, ecchymosis or petechia.  Lab Results  Component Value Date   WBC 4.4 07/02/2016   HGB 13.9 07/02/2016   HCT 39.7 07/02/2016   MCV 92 07/02/2016   PLT 209 07/02/2016   No results found for: FERRITIN, IRON, TIBC, UIBC, IRONPCTSAT Lab Results  Component Value Date   RETICCTPCT 1.07 12/06/2015   RBC 4.31 07/02/2016   RETICCTABS 48.79 12/06/2015   No results found for: Nils Pyle [facility] Lab Results  Component Value Date   IGGSERUM 785 06/04/2016   IGMSERUM 52 06/04/2016   No results found for:  Odetta Pink, SPEI   Chemistry      Component Value Date/Time   NA 140 06/04/2016 0845   NA 141 02/07/2016 1149   K 3.9 06/04/2016 0845   K 4.3 02/07/2016 1149   CL 106 06/04/2016 0845   CO2 27 06/04/2016 0845   CO2 27 02/07/2016 1149   BUN 11 06/04/2016 0845   BUN 10.9 02/07/2016 1149   CREATININE 1.0 06/04/2016 0845   CREATININE 0.9 02/07/2016 1149      Component Value Date/Time   CALCIUM 9.6 06/04/2016 0845   CALCIUM 9.6 02/07/2016 1149   ALKPHOS 109 (H) 06/04/2016 0845   ALKPHOS 127 02/07/2016 1149   AST 28 06/04/2016 0845   AST 17 02/07/2016 1149   ALT 27 06/04/2016 0845   ALT 16 02/07/2016 1149   BILITOT 0.70 06/04/2016 0845   BILITOT 0.42 02/07/2016 1149     Impression and Plan: Mr. Weida is a pleasant 46 yo white male with follicular large cell non-Hodgkin's lymphoma.   From my point, everything is looking quite good.  We will proceed with his 6th cycle of bendamustine. I will continue to hold the Rituxan for now.   I will do a PET scan in 4 weeks. I will then see him back in 6 weeks.  I would not think that we would have to do any kind of maintenance therapy with this lymphoma.  [name], MD 5/31/20189:59 AM

## 2016-07-03 ENCOUNTER — Ambulatory Visit (HOSPITAL_BASED_OUTPATIENT_CLINIC_OR_DEPARTMENT_OTHER): Payer: 59

## 2016-07-03 VITALS — BP 119/66 | HR 88 | Temp 97.8°F | Resp 16

## 2016-07-03 DIAGNOSIS — C8223 Follicular lymphoma grade III, unspecified, intra-abdominal lymph nodes: Secondary | ICD-10-CM

## 2016-07-03 DIAGNOSIS — Z5111 Encounter for antineoplastic chemotherapy: Secondary | ICD-10-CM

## 2016-07-03 MED ORDER — SODIUM CHLORIDE 0.9 % IV SOLN
Freq: Once | INTRAVENOUS | Status: AC
  Administered 2016-07-03: 10:00:00 via INTRAVENOUS

## 2016-07-03 MED ORDER — SODIUM CHLORIDE 0.9 % IV SOLN
90.0000 mg/m2 | Freq: Once | INTRAVENOUS | Status: AC
  Administered 2016-07-03: 200 mg via INTRAVENOUS
  Filled 2016-07-03: qty 8

## 2016-07-03 MED ORDER — DEXAMETHASONE SODIUM PHOSPHATE 10 MG/ML IJ SOLN
INTRAMUSCULAR | Status: AC
  Filled 2016-07-03: qty 1

## 2016-07-03 MED ORDER — DEXAMETHASONE SODIUM PHOSPHATE 10 MG/ML IJ SOLN
10.0000 mg | Freq: Once | INTRAMUSCULAR | Status: AC
  Administered 2016-07-03: 10 mg via INTRAVENOUS

## 2016-07-03 NOTE — Patient Instructions (Signed)

## 2016-07-10 ENCOUNTER — Telehealth: Payer: Self-pay | Admitting: *Deleted

## 2016-07-10 MED ORDER — AZITHROMYCIN 250 MG PO TABS: ORAL_TABLET | ORAL | 0 refills | Status: DC

## 2016-07-10 NOTE — Telephone Encounter (Signed)
Duplicate encounter

## 2016-07-10 NOTE — Telephone Encounter (Signed)
Received call from patient stating he picked up a bug from his son.  Fever 99-100.  Coughing up phlegm.  Dr. Marin Olp notified.  Zpack sent to pharmacy.

## 2016-07-30 ENCOUNTER — Ambulatory Visit (HOSPITAL_COMMUNITY)
Admission: RE | Admit: 2016-07-30 | Discharge: 2016-07-30 | Disposition: A | Discharge status: Home / Self Care | Payer: 59 | Source: Ambulatory Visit | Attending: Hematology & Oncology | Admitting: Hematology & Oncology

## 2016-07-30 DIAGNOSIS — J320 Chronic maxillary sinusitis: Secondary | ICD-10-CM | POA: Insufficient documentation

## 2016-07-30 DIAGNOSIS — C8223 Follicular lymphoma grade III, unspecified, intra-abdominal lymph nodes: Secondary | ICD-10-CM | POA: Diagnosis not present

## 2016-07-30 DIAGNOSIS — I700 Atherosclerosis of aorta: Secondary | ICD-10-CM | POA: Insufficient documentation

## 2016-07-30 DIAGNOSIS — J439 Emphysema, unspecified: Secondary | ICD-10-CM | POA: Diagnosis not present

## 2016-07-30 DIAGNOSIS — J8410 Pulmonary fibrosis, unspecified: Secondary | ICD-10-CM | POA: Diagnosis not present

## 2016-07-30 DIAGNOSIS — C8580 Other specified types of non-Hodgkin lymphoma, unspecified site: Secondary | ICD-10-CM | POA: Diagnosis not present

## 2016-07-30 LAB — GLUCOSE, CAPILLARY: Glucose-Capillary: 141 mg/dL — ABNORMAL HIGH (ref 65–99)

## 2016-07-30 MED ORDER — FLUDEOXYGLUCOSE F - 18 (FDG) INJECTION: 12.3000 | Freq: Once | INTRAVENOUS | Status: DC | PRN

## 2016-08-13 ENCOUNTER — Other Ambulatory Visit: Payer: 59

## 2016-08-13 ENCOUNTER — Ambulatory Visit: Payer: 59 | Admitting: Family

## 2016-08-18 DIAGNOSIS — M76822 Posterior tibial tendinitis, left leg: Secondary | ICD-10-CM | POA: Diagnosis not present

## 2016-08-18 DIAGNOSIS — Z9889 Other specified postprocedural states: Secondary | ICD-10-CM | POA: Diagnosis not present

## 2016-08-18 DIAGNOSIS — M76821 Posterior tibial tendinitis, right leg: Secondary | ICD-10-CM | POA: Diagnosis not present

## 2016-08-20 ENCOUNTER — Ambulatory Visit (HOSPITAL_BASED_OUTPATIENT_CLINIC_OR_DEPARTMENT_OTHER): Payer: 59 | Admitting: Hematology & Oncology

## 2016-08-20 ENCOUNTER — Other Ambulatory Visit (HOSPITAL_BASED_OUTPATIENT_CLINIC_OR_DEPARTMENT_OTHER): Payer: 59

## 2016-08-20 VITALS — BP 126/79 | HR 94 | Temp 98.3°F | Resp 16 | Wt 255.1 lb

## 2016-08-20 DIAGNOSIS — C8223 Follicular lymphoma grade III, unspecified, intra-abdominal lymph nodes: Secondary | ICD-10-CM

## 2016-08-20 DIAGNOSIS — Z720 Tobacco use: Secondary | ICD-10-CM

## 2016-08-20 LAB — CBC WITH DIFFERENTIAL (CANCER CENTER ONLY)
BASO#: 0 10*3/uL (ref 0.0–0.2)
BASO%: 0.2 % (ref 0.0–2.0)
EOS ABS: 0.2 10*3/uL (ref 0.0–0.5)
EOS%: 4.1 % (ref 0.0–7.0)
HEMATOCRIT: 37.3 % — AB (ref 38.7–49.9)
HGB: 13.3 g/dL (ref 13.0–17.1)
LYMPH#: 0.9 10*3/uL (ref 0.9–3.3)
LYMPH%: 20.4 % (ref 14.0–48.0)
MCH: 33.1 pg (ref 28.0–33.4)
MCHC: 35.7 g/dL (ref 32.0–35.9)
MCV: 93 fL (ref 82–98)
MONO#: 0.5 10*3/uL (ref 0.1–0.9)
MONO%: 12 % (ref 0.0–13.0)
NEUT#: 2.6 10*3/uL (ref 1.5–6.5)
NEUT%: 63.3 % (ref 40.0–80.0)
PLATELETS: 199 10*3/uL (ref 145–400)
RBC: 4.02 10*6/uL — ABNORMAL LOW (ref 4.20–5.70)
RDW: 12.9 % (ref 11.1–15.7)
WBC: 4.2 10*3/uL (ref 4.0–10.0)

## 2016-08-20 LAB — CMP (CANCER CENTER ONLY)
ALK PHOS: 98 U/L — AB (ref 26–84)
ALT(SGPT): 25 U/L (ref 10–47)
AST: 25 U/L (ref 11–38)
Albumin: 3.4 g/dL (ref 3.3–5.5)
BUN: 7 mg/dL (ref 7–22)
CALCIUM: 9.2 mg/dL (ref 8.0–10.3)
CHLORIDE: 106 meq/L (ref 98–108)
CO2: 27 mEq/L (ref 18–33)
Creat: 0.9 mg/dl (ref 0.6–1.2)
GLUCOSE: 109 mg/dL (ref 73–118)
POTASSIUM: 3.7 meq/L (ref 3.3–4.7)
Sodium: 139 mEq/L (ref 128–145)
Total Bilirubin: 0.7 mg/dl (ref 0.20–1.60)
Total Protein: 6.6 g/dL (ref 6.4–8.1)

## 2016-08-20 NOTE — Progress Notes (Signed)
Hematology and Oncology Follow Up Jennings  James Jennings [id number] 10/29/1970 46 y.o. 08/20/2016   Principle Diagnosis:  Follicular large cell non-Hodgkin's lymphoma (FLIPI-2 = 0)  Current Therapy:   Rituxan/bendamustine-s/p cycle #6 (will hold Rituxan today) - last dose on 07/02/2016    Interim History:  James Jennings is back for follow-up. He is done very well with treatment. He has had no problems with fever. He's had some nausea but no vomiting. There has been some fatigue.  We did go ahead and repeat a PET scan. This was done on June 28. The PET scan did not show any evidence of residual lymphoma. There is no activity noted. He had a calcified lymph nodes.  He has been working a lot. Has been traveling a lot. He is still smoking. Both he and his wife will try to stop smoking.  He's had no rashes. He does get a lot of insect bites. Thankfully, none of the insect bites become infected.  He's had no change in bowel or bladder habits. He's had no bleeding. He's had no bruising. He's had no leg swelling. He's had no cough or shortness of breath.  Overall, his performance status is ECOG 0.  Medications:  Allergies as of 08/20/2016      Reactions   Shellfish Allergy Anaphylaxis   Lactose Intolerance (gi) Diarrhea   Mango Flavor Swelling, Other (See Comments)   MANGO(S) LIPS SWELL      Medication List       Accurate as of 08/20/16  4:30 PM. Always use your most recent med list.          acetaminophen 325 MG tablet Commonly known as:  TYLENOL Take 650 mg by mouth every 6 (six) hours as needed for moderate pain or headache.   cetirizine 10 MG tablet Commonly known as:  ZYRTEC Take 10 mg by mouth daily as needed for allergies.   famciclovir 500 MG tablet Commonly known as:  FAMVIR Take 1 tablet (500 mg total) by mouth daily.   multivitamin Tabs tablet Take 1 tablet by mouth daily.       Allergies:  Allergies  Allergen Reactions  . Shellfish Allergy Anaphylaxis  .  Lactose Intolerance (Gi) Diarrhea  . Mango Flavor Swelling and Other (See Comments)    MANGO(S) LIPS SWELL    Past Medical History, Surgical history, Social history, and Family History were reviewed and updated.  Review of Systems: All other 10 point review of systems is negative.   Physical Exam:  weight is 255 lb 1.9 oz (115.7 kg). His oral temperature is 98.3 F (36.8 C). His blood pressure is 126/79 and his pulse is 94. His respiration is 16 and oxygen saturation is 99%.   Wt Readings from Last 3 Encounters:  08/20/16 255 lb 1.9 oz (115.7 kg)  07/02/16 252 lb (114.3 kg)  06/04/16 247 lb 1.9 oz (112.1 kg)    Well-developed well-nourished white male. Head and neck exam shows no ocular or oral lesions. He has no palpable adenopathy in the neck. Thyroid is nonpalpable. Lungs are clear bilaterally. Cardiac exam regular rate and rhythm with no murmurs, rubs or bruits. Abdomen is soft. Has good bowel sounds. There is no fluid wave. There is no palpable liver or spleen tip. Back exam shows no tenderness over the spine, ribs or hips. Extremities shows no clubbing, cyanosis or edema. Neurological exam shows no focal neurological deficits. Skin exam shows no rashes, ecchymosis or petechia.  Lab Results  Component Value Date  WBC 4.2 08/20/2016   HGB 13.3 08/20/2016   HCT 37.3 (L) 08/20/2016   MCV 93 08/20/2016   PLT 199 08/20/2016   No results found for: FERRITIN, IRON, TIBC, UIBC, IRONPCTSAT Lab Results  Component Value Date   RETICCTPCT 1.07 12/06/2015   RBC 4.02 (L) 08/20/2016   RETICCTABS 48.79 12/06/2015   No results found for: [facility]ils P[facility] Lab Results  Component Value Date   IGGSERUM 785 06/04/2016   IGMSERUM 52 06/04/2016   No results found for: Odetta Pink, SPEI   Chemistry      Component Value Date/Time   NA 139 08/20/2016 1532   NA 141 02/07/2016 1149   K 3.7 08/20/2016 1532   K 4.3  02/07/2016 1149   CL 106 08/20/2016 1532   CO2 27 08/20/2016 1532   CO2 27 02/07/2016 1149   BUN 7 08/20/2016 1532   BUN 10.9 02/07/2016 1149   CREATININE 0.9 08/20/2016 1532   CREATININE 0.9 02/07/2016 1149      Component Value Date/Time   CALCIUM 9.2 08/20/2016 1532   CALCIUM 9.6 02/07/2016 1149   ALKPHOS 98 (H) 08/20/2016 1532   ALKPHOS 127 02/07/2016 1149   AST 25 08/20/2016 1532   AST 17 02/07/2016 1149   ALT 25 08/20/2016 1532   ALT 16 02/07/2016 1149   BILITOT 0.70 08/20/2016 1532   BILITOT 0.42 02/07/2016 1149     Impression and Plan: James Jennings is a pleasant 46 yo white male with follicular large cell non-Hodgkin's lymphoma.   I believe that he is in remission. Hopefully, this will be a long-lasting remission. He really did not have a lot of lymphoma burden when we first treated him.  I do think that we have to follow up with another PET scan in 6 months. I'll then this will be the best way for [place] to identify any potential recurrence of his lymphoma.  I will like to see him back in 3 months. I think this would be reasonable.  To me, I think his biggest risk for malignancy will be from smoking. Hopefully, he will stop smoking. I really does not this is a difficult task but I know that both he and his wife are very motivated.   [name], MD 7/19/20184:30 PM

## 2016-08-21 LAB — LACTATE DEHYDROGENASE: LDH: 193 U/L (ref 125–245)

## 2016-10-19 DIAGNOSIS — R509 Fever, unspecified: Secondary | ICD-10-CM | POA: Diagnosis not present

## 2016-10-22 ENCOUNTER — Telehealth: Payer: Self-pay | Admitting: *Deleted

## 2016-10-22 NOTE — Telephone Encounter (Signed)
Patient has been diagnosed with a viral infection from PCP. Other family members have also been ill. Patient has had a fever over the last 5 days, T max 103. His last chemo treatment was in June of this year. The wife wants to make sure that the patient doesn't need any further workup with Dr Marin Olp.  Reviewed symptoms with Dr Marin Olp. At this time he doesn't feel like patient needs to have any further workup or assessment. Treat fever as needed and allow virus to run its course.  Wife is aware of Dr Antonieta Pert recommendations.

## 2016-10-23 DIAGNOSIS — R509 Fever, unspecified: Secondary | ICD-10-CM | POA: Diagnosis not present

## 2016-10-23 DIAGNOSIS — R05 Cough: Secondary | ICD-10-CM | POA: Diagnosis not present

## 2016-11-09 ENCOUNTER — Telehealth: Payer: Self-pay | Admitting: *Deleted

## 2016-11-09 ENCOUNTER — Encounter: Payer: Self-pay | Admitting: Hematology & Oncology

## 2016-11-09 ENCOUNTER — Other Ambulatory Visit: Payer: Self-pay | Admitting: Family

## 2016-11-09 DIAGNOSIS — R911 Solitary pulmonary nodule: Secondary | ICD-10-CM

## 2016-11-09 DIAGNOSIS — C8223 Follicular lymphoma grade III, unspecified, intra-abdominal lymph nodes: Secondary | ICD-10-CM

## 2016-11-09 NOTE — Telephone Encounter (Signed)
Patient had a chest xray completed per his PCP. The xray showed a small lung mass and it was recommended to follow up with CT scan. At this time a CT scan has not been ordered.  Results obtained from PCP's office and given to Dr Marin Olp; copy also placed in scan bin. Dr Marin Olp will follow up with patient/Dr Bjorn Loser regarding results.   Patient aware that Dr Marin Olp has report and that a f/u CT will be needed. He will call office back if he doesn't hear back from this office or PCP office to schedule scan.

## 2016-11-16 DIAGNOSIS — Z23 Encounter for immunization: Secondary | ICD-10-CM | POA: Diagnosis not present

## 2016-11-16 DIAGNOSIS — R0981 Nasal congestion: Secondary | ICD-10-CM | POA: Diagnosis not present

## 2016-11-16 DIAGNOSIS — R05 Cough: Secondary | ICD-10-CM | POA: Diagnosis not present

## 2016-12-03 ENCOUNTER — Encounter (HOSPITAL_BASED_OUTPATIENT_CLINIC_OR_DEPARTMENT_OTHER): Payer: Self-pay

## 2016-12-03 ENCOUNTER — Ambulatory Visit (HOSPITAL_BASED_OUTPATIENT_CLINIC_OR_DEPARTMENT_OTHER)
Admission: RE | Admit: 2016-12-03 | Discharge: 2016-12-03 | Disposition: A | Discharge status: Home / Self Care | Payer: 59 | Source: Ambulatory Visit | Attending: Family | Admitting: Family

## 2016-12-03 ENCOUNTER — Other Ambulatory Visit (HOSPITAL_BASED_OUTPATIENT_CLINIC_OR_DEPARTMENT_OTHER): Payer: 59

## 2016-12-03 ENCOUNTER — Ambulatory Visit (HOSPITAL_BASED_OUTPATIENT_CLINIC_OR_DEPARTMENT_OTHER): Payer: 59 | Admitting: Hematology & Oncology

## 2016-12-03 VITALS — BP 126/78 | HR 93 | Temp 98.0°F | Resp 18 | Wt 246.0 lb

## 2016-12-03 DIAGNOSIS — R911 Solitary pulmonary nodule: Secondary | ICD-10-CM | POA: Diagnosis present

## 2016-12-03 DIAGNOSIS — Z720 Tobacco use: Secondary | ICD-10-CM | POA: Diagnosis not present

## 2016-12-03 DIAGNOSIS — C8223 Follicular lymphoma grade III, unspecified, intra-abdominal lymph nodes: Secondary | ICD-10-CM | POA: Diagnosis not present

## 2016-12-03 DIAGNOSIS — J8410 Pulmonary fibrosis, unspecified: Secondary | ICD-10-CM | POA: Insufficient documentation

## 2016-12-03 DIAGNOSIS — R918 Other nonspecific abnormal finding of lung field: Secondary | ICD-10-CM

## 2016-12-03 LAB — CBC WITH DIFFERENTIAL (CANCER CENTER ONLY)
BASO#: 0 10*3/uL (ref 0.0–0.2)
BASO%: 0.3 % (ref 0.0–2.0)
EOS ABS: 0.3 10*3/uL (ref 0.0–0.5)
EOS%: 4.3 % (ref 0.0–7.0)
HCT: 41.1 % (ref 38.7–49.9)
HGB: 14.4 g/dL (ref 13.0–17.1)
LYMPH#: 1.1 10*3/uL (ref 0.9–3.3)
LYMPH%: 18.1 % (ref 14.0–48.0)
MCH: 32.4 pg (ref 28.0–33.4)
MCHC: 35 g/dL (ref 32.0–35.9)
MCV: 92 fL (ref 82–98)
MONO#: 0.5 10*3/uL (ref 0.1–0.9)
MONO%: 8.5 % (ref 0.0–13.0)
NEUT%: 68.8 % (ref 40.0–80.0)
NEUTROS ABS: 4 10*3/uL (ref 1.5–6.5)
PLATELETS: 227 10*3/uL (ref 145–400)
RBC: 4.45 10*6/uL (ref 4.20–5.70)
RDW: 12.9 % (ref 11.1–15.7)
WBC: 5.9 10*3/uL (ref 4.0–10.0)

## 2016-12-03 LAB — LACTATE DEHYDROGENASE: LDH: 177 U/L (ref 125–245)

## 2016-12-03 LAB — CMP (CANCER CENTER ONLY)
ALT(SGPT): 24 U/L (ref 10–47)
AST: 27 U/L (ref 11–38)
Albumin: 4.1 g/dL (ref 3.3–5.5)
Alkaline Phosphatase: 99 U/L — ABNORMAL HIGH (ref 26–84)
BUN: 10 mg/dL (ref 7–22)
CHLORIDE: 100 meq/L (ref 98–108)
CO2: 27 meq/L (ref 18–33)
CREATININE: 1 mg/dL (ref 0.6–1.2)
Calcium: 9.1 mg/dL (ref 8.0–10.3)
GLUCOSE: 128 mg/dL — AB (ref 73–118)
POTASSIUM: 3.8 meq/L (ref 3.3–4.7)
SODIUM: 141 meq/L (ref 128–145)
TOTAL PROTEIN: 7.1 g/dL (ref 6.4–8.1)
Total Bilirubin: 0.8 mg/dl (ref 0.20–1.60)

## 2016-12-03 MED ORDER — IOPAMIDOL (ISOVUE-300) INJECTION 61%
100.0000 mL | Freq: Once | INTRAVENOUS | Status: AC | PRN
  Administered 2016-12-03: 80 mL via INTRAVENOUS

## 2016-12-03 NOTE — Progress Notes (Signed)
Hematology and Oncology Follow Up Visit  James Jennings [id number] [birth year] 46 y.o. 12/03/2016   Principle Diagnosis:  Follicular large cell non-Hodgkin's lymphoma (FLIPI-2 = 0)  Current Therapy:   Rituxan/bendamustine-s/p cycle #6 (will hold Rituxan today) - last dose on 07/02/2016    Interim History:  James Jennings is back for follow-up. He is doing quite well. He really has had no problems outside of the fact that he had a cough a few weeks ago. He went to his family doctor. A chest x-ray was done. The chest x-ray showed a nodule in the left lung.  Because of his history of tobacco use, we did a CT scan on him today. The CT scan showed a calcified granuloma in the left lung that corresponded to the nodule seen on chest x-ray. However, the radiologist noted that he had a 5 mm nonspecific nodule in the right lung base. The radiologist recommended follow-up CT scan in 12 months.  I called James Jennings about this. He understands. He agrees to the CT scan in one year.  As far as the lymphoma is concerned, he is doing well with this. His last PET scan in June did not show any evidence of residual/recurrent lymphoma.  He is trying to stop smoking. He and his wife are doing this together.  He is still working. He travels quite a bit for his job. He goes out to [place].  He's had no fever. He's had no rashes. He's had no change in bowel or bladder habits. He's had no nausea or vomiting. He's had no palpable lymph nodes.  Overall, his performance status is ECOG 0.   Medications:  Allergies as of 12/03/2016      Reactions   Shellfish Allergy Anaphylaxis   Lactose Intolerance (gi) Diarrhea   Mango Flavor Swelling, Other (See Comments)   MANGO(S) LIPS SWELL      Medication List       Accurate as of 12/03/16 12:48 PM. Always use your most recent med list.          acetaminophen 325 MG tablet Commonly known as:  TYLENOL Take 650 mg by mouth every 6 (six) hours as needed for moderate pain  or headache.   cetirizine 10 MG tablet Commonly known as:  ZYRTEC Take 10 mg by mouth daily as needed for allergies.   multivitamin Tabs tablet Take 1 tablet by mouth daily.       Allergies:  Allergies  Allergen Reactions  . Shellfish Allergy Anaphylaxis  . Lactose Intolerance (Gi) Diarrhea  . Mango Flavor Swelling and Other (See Comments)    MANGO(S) LIPS SWELL    Past Medical History, Surgical history, Social history, and Family History were reviewed and updated.  Review of Systems: As stated in the interim history.   Physical Exam:  weight is 246 lb (111.6 kg). His oral temperature is 98 F (36.7 C). His blood pressure is 126/78 and his pulse is 93. His respiration is 18 and oxygen saturation is 99%.   Wt Readings from Last 3 Encounters:  12/03/16 246 lb (111.6 kg)  08/20/16 255 lb 1.9 oz (115.7 kg)  07/02/16 252 lb (114.3 kg)    Well-developed and well-nourished white male. He might be mildly obese. Head and neck exam shows no ocular or oral lesions. There are no palpable cervical or supraclavicular lymph nodes. Lungs are clear bilaterally. Cardiac exam regular rate and rhythm with no murmurs, rubs or bruits. Axillary exam shows no bilateral axillary adenopathy. Abdomen is soft.  He has mildly obese. He has no fluid wave. There is no guarding or rebound tenderness. There is no palpable liver or spleen tip. Back exam shows no tenderness over the spine, ribs or hips. Extremities shows no clubbing, cyanosis or edema. Neurological exam shows no focal neurological deficits. Skin exam shows no rashes, ecchymoses or petechia.   Lab Results  Component Value Date   WBC 5.9 12/03/2016   HGB 14.4 12/03/2016   HCT 41.1 12/03/2016   MCV 92 12/03/2016   PLT 227 12/03/2016   No results found for: FERRITIN, IRON, TIBC, UIBC, IRONPCTSAT Lab Results  Component Value Date   RETICCTPCT 1.07 12/06/2015   RBC 4.45 12/03/2016   RETICCTABS 48.79 12/06/2015   No results found for:  [facility]ils Pyle [facility] Lab Results  Component Value Date   IGGSERUM 785 06/04/2016   IGMSERUM 52 06/04/2016   No results found for: Odetta Pink, SPEI   Chemistry      Component Value Date/Time   NA 141 12/03/2016 1155   NA 141 02/07/2016 1149   K 3.8 12/03/2016 1155   K 4.3 02/07/2016 1149   CL 100 12/03/2016 1155   CO2 27 12/03/2016 1155   CO2 27 02/07/2016 1149   BUN 10 12/03/2016 1155   BUN 10.9 02/07/2016 1149   CREATININE 1.0 12/03/2016 1155   CREATININE 0.9 02/07/2016 1149      Component Value Date/Time   CALCIUM 9.1 12/03/2016 1155   CALCIUM 9.6 02/07/2016 1149   ALKPHOS 99 (H) 12/03/2016 1155   ALKPHOS 127 02/07/2016 1149   AST 27 12/03/2016 1155   AST 17 02/07/2016 1149   ALT 24 12/03/2016 1155   ALT 16 02/07/2016 1149   BILITOT 0.80 12/03/2016 1155   BILITOT 0.42 02/07/2016 1149     Impression and Plan: James Jennings is a pleasant 46 yo white male with follicular large cell non-Hodgkin's lymphoma. He completed 6 cycles of chemotherapy with bendamustine on 07/03/2016. He was not tolerant of Rituxan.  I still feel that he is in remission. I cannot find anything on his exam that suggest relapse of his lymphoma.  Again, with his tobacco use, we do have to be careful with this right lung nodule. We will have to set up a CT scan in one year.  I will like to see him back in 4 months. I think this would be reasonable. I don't see that we have to do a PET scan on him. His labs look okay.  I think that is the best thing he is doing for himself is quitting tobacco use. I applaud him for doing this.   [name], MD 11/1/201812:48 PM

## 2016-12-04 LAB — BETA 2 MICROGLOBULIN, SERUM: BETA 2: 1.6 mg/L (ref 0.6–2.4)

## 2017-04-02 ENCOUNTER — Inpatient Hospital Stay: Payer: 59

## 2017-04-02 ENCOUNTER — Inpatient Hospital Stay: Payer: 59 | Attending: Hematology & Oncology | Admitting: Hematology & Oncology

## 2017-04-02 VITALS — BP 146/76 | HR 96 | Temp 97.9°F | Resp 19 | Wt 257.5 lb

## 2017-04-02 DIAGNOSIS — Z79899 Other long term (current) drug therapy: Secondary | ICD-10-CM | POA: Diagnosis not present

## 2017-04-02 DIAGNOSIS — R05 Cough: Secondary | ICD-10-CM | POA: Diagnosis not present

## 2017-04-02 DIAGNOSIS — C8223 Follicular lymphoma grade III, unspecified, intra-abdominal lymph nodes: Secondary | ICD-10-CM | POA: Insufficient documentation

## 2017-04-02 DIAGNOSIS — Z9221 Personal history of antineoplastic chemotherapy: Secondary | ICD-10-CM | POA: Diagnosis not present

## 2017-04-02 DIAGNOSIS — F17210 Nicotine dependence, cigarettes, uncomplicated: Secondary | ICD-10-CM | POA: Insufficient documentation

## 2017-04-02 LAB — CBC WITH DIFFERENTIAL (CANCER CENTER ONLY)
BASOS ABS: 0 10*3/uL (ref 0.0–0.1)
Basophils Relative: 0 %
EOS ABS: 0.2 10*3/uL (ref 0.0–0.5)
EOS PCT: 2 %
HCT: 41.7 % (ref 38.7–49.9)
Hemoglobin: 14.6 g/dL (ref 13.0–17.1)
Lymphocytes Relative: 15 %
Lymphs Abs: 1 10*3/uL (ref 0.9–3.3)
MCH: 31.9 pg (ref 28.0–33.4)
MCHC: 35 g/dL (ref 32.0–35.9)
MCV: 91.2 fL (ref 82.0–98.0)
Monocytes Absolute: 0.6 10*3/uL (ref 0.1–0.9)
Monocytes Relative: 9 %
NEUTROS PCT: 74 %
Neutro Abs: 5.2 10*3/uL (ref 1.5–6.5)
PLATELETS: 221 10*3/uL (ref 145–400)
RBC: 4.57 MIL/uL (ref 4.20–5.70)
RDW: 12.6 % (ref 11.1–15.7)
WBC: 7 10*3/uL (ref 4.0–10.0)

## 2017-04-02 LAB — LACTATE DEHYDROGENASE: LDH: 135 U/L (ref 98–192)

## 2017-04-02 LAB — CMP (CANCER CENTER ONLY)
ALBUMIN: 3.7 g/dL (ref 3.5–5.0)
ALT: 22 U/L (ref 0–55)
AST: 21 U/L (ref 5–34)
Alkaline Phosphatase: 130 U/L (ref 40–150)
Anion gap: 12 — ABNORMAL HIGH (ref 3–11)
BUN: 15 mg/dL (ref 7–26)
CHLORIDE: 102 mmol/L (ref 98–109)
CO2: 26 mmol/L (ref 22–29)
CREATININE: 0.97 mg/dL (ref 0.70–1.30)
Calcium: 9.6 mg/dL (ref 8.4–10.4)
GFR, Est AFR Am: >60 mL/min (ref >60)
GFR, Estimated: >60 mL/min (ref >60)
GLUCOSE: 89 mg/dL (ref 70–140)
Potassium: 4.1 mmol/L (ref 3.5–5.1)
SODIUM: 140 mmol/L (ref 136–145)
Total Bilirubin: 0.5 mg/dL (ref 0.2–1.2)
Total Protein: 7.4 g/dL (ref 6.4–8.3)

## 2017-04-02 MED ORDER — AMOXICILLIN-POT CLAVULANATE 875-125 MG PO TABS: 1.0000 | ORAL_TABLET | Freq: Two times a day (BID) | ORAL | 0 refills | Status: DC

## 2017-04-02 NOTE — Progress Notes (Signed)
Hematology and Oncology Follow Up Visit  James Jennings [id number] 12/10/1970 47 y.o. 04/02/2017   Principle Diagnosis:  Follicular large cell non-Hodgkin's lymphoma (FLIPI-2 = 0)  Current Therapy:   Rituxan/bendamustine-s/p cycle #6 (will hold Rituxan today) - last dose on 07/02/2016    Interim History:  James Jennings is back for follow-up.  He comes in with his wife.  As always, there are a lot of fun to talk to.  He has almost stopped smoking.  He is down to 3 cigarettes a day.  He really is doing a great job with this.  He does complain of some cough.  He has sinus issues.  Both he and his wife have some sinus problems right now.  I will send in an antibiotic for him and see if this will help.  He has had no problems with bowels or bladder.  He has had no rashes.  His skin is a little dry.  He has had no leg swelling.  There is been no abdominal pain.  He has had no bleeding.  He has had no bruising.  He is busy at work.  He probably will go out to the [facility] next month.  Their 49 year-old boy is doing quite well.  He really enjoyed the big snow that we had in December.    Hopefully, he will start exercising.  He is gained a little bit of weight.    Overall, his performance status is ECOG 0.   Medications:  Allergies as of 04/02/2017      Reactions   Shellfish Allergy Anaphylaxis   Lactose Intolerance (gi) Diarrhea   Mango Flavor Swelling, Other (See Comments)   MANGO(S) LIPS SWELL      Medication List        Accurate as of 04/02/17 12:52 PM. Always use your most recent med list.          acetaminophen 325 MG tablet Commonly known as:  TYLENOL Take 650 mg by mouth every 6 (six) hours as needed for moderate pain or headache.   cetirizine 10 MG tablet Commonly known as:  ZYRTEC Take 10 mg by mouth daily as needed for allergies.   multivitamin Tabs tablet Take 1 tablet by mouth daily.   varenicline 0.5 MG X 11 & 1 MG X 42 tablet Commonly known as:  CHANTIX  PAK Take by mouth 2 (two) times daily. Take one 0.5 mg tablet by mouth once daily for 3 days, then increase to one 0.5 mg tablet twice daily for 4 days, then increase to one 1 mg tablet twice daily.       Allergies:  Allergies  Allergen Reactions  . Shellfish Allergy Anaphylaxis  . Lactose Intolerance (Gi) Diarrhea  . Mango Flavor Swelling and Other (See Comments)    MANGO(S) LIPS SWELL    Past Medical History, Surgical history, Social history, and Family History were reviewed and updated.  Review of Systems: Review of Systems  Constitutional: Negative.   Eyes: Negative.   Respiratory: Negative.   Cardiovascular: Negative.   Gastrointestinal: Negative.   Genitourinary: Negative.   Musculoskeletal: Negative.   Skin: Negative.   Neurological: Negative.   Endo/Heme/Allergies: Negative.   Psychiatric/Behavioral: Negative.     Physical Exam:  weight is 257 lb 8 oz (116.8 kg). His oral temperature is 97.9 F (36.6 C). His blood pressure is 146/76 (abnormal) and his pulse is 96. His respiration is 19 and oxygen saturation is 100%.   Wt Readings from Last 3  Encounters:  04/02/17 257 lb 8 oz (116.8 kg)  12/03/16 246 lb (111.6 kg)  08/20/16 255 lb 1.9 oz (115.7 kg)    Physical Exam  Constitutional: He is oriented to person, place, and time.  HENT:  Head: Normocephalic and atraumatic.  Mouth/Throat: Oropharynx is clear and moist.  Eyes: EOM are normal. Pupils are equal, round, and reactive to light.  Neck: Normal range of motion.  Cardiovascular: Normal rate, regular rhythm and normal heart sounds.  Pulmonary/Chest: Effort normal and breath sounds normal.  Abdominal: Soft. Bowel sounds are normal.  Musculoskeletal: Normal range of motion. He exhibits no edema, tenderness or deformity.  Lymphadenopathy:    He has no cervical adenopathy.  Neurological: He is alert and oriented to person, place, and time.  Skin: Skin is warm and dry. No rash noted. No erythema.   Psychiatric: He has a normal mood and affect. His behavior is normal. Judgment and thought content normal.  Vitals reviewed.    Lab Results  Component Value Date   WBC 7.0 04/02/2017   HGB 14.4 12/03/2016   HCT 41.7 04/02/2017   MCV 91.2 04/02/2017   PLT 221 04/02/2017   No results found for: FERRITIN, IRON, TIBC, UIBC, IRONPCTSAT Lab Results  Component Value Date   RETICCTPCT 1.07 12/06/2015   RBC 4.57 04/02/2017   RETICCTABS 48.79 12/06/2015   No results found for: [facility] Lab Results  Component Value Date   IGGSERUM 785 06/04/2016   IGMSERUM 52 06/04/2016   No results found for: Odetta Pink, SPEI   Chemistry      Component Value Date/Time   NA 141 12/03/2016 1155   NA 141 02/07/2016 1149   K 3.8 12/03/2016 1155   K 4.3 02/07/2016 1149   CL 100 12/03/2016 1155   CO2 27 12/03/2016 1155   CO2 27 02/07/2016 1149   BUN 10 12/03/2016 1155   BUN 10.9 02/07/2016 1149   CREATININE 1.0 12/03/2016 1155   CREATININE 0.9 02/07/2016 1149      Component Value Date/Time   CALCIUM 9.1 12/03/2016 1155   CALCIUM 9.6 02/07/2016 1149   ALKPHOS 99 (H) 12/03/2016 1155   ALKPHOS 127 02/07/2016 1149   AST 27 12/03/2016 1155   AST 17 02/07/2016 1149   ALT 24 12/03/2016 1155   ALT 16 02/07/2016 1149   BILITOT 0.80 12/03/2016 1155   BILITOT 0.42 02/07/2016 1149     Impression and Plan: James Jennings is a pleasant 47 yo white male with follicular large cell non-Hodgkin's lymphoma. He completed 6 cycles of chemotherapy with bendamustine on 07/03/2016. He was not tolerant of Rituxan.  I still feel that he is in remission. I cannot find anything on his exam that suggest relapse of his lymphoma.  I think that is great that he is almost stopped smoking.  I know this is been a huge achievement for him.  Hopefully, he will be able to lose some weight.  I know this is difficult with his job and the fact that  he travels a lot for his job.  We will plan to get him back in 4 more months.  I do not see that we need to do any scans on him as he really has no symptoms.     [name], MD 3/1/201912:52 PM

## 2017-04-03 LAB — BETA 2 MICROGLOBULIN, SERUM: BETA 2 MICROGLOBULIN: 1.7 mg/L (ref 0.6–2.4)

## 2017-07-14 DIAGNOSIS — Z719 Counseling, unspecified: Secondary | ICD-10-CM | POA: Diagnosis not present

## 2017-07-30 DIAGNOSIS — Z719 Counseling, unspecified: Secondary | ICD-10-CM | POA: Diagnosis not present

## 2017-08-04 DIAGNOSIS — E782 Mixed hyperlipidemia: Secondary | ICD-10-CM | POA: Diagnosis not present

## 2017-08-04 DIAGNOSIS — C8515 Unspecified B-cell lymphoma, lymph nodes of inguinal region and lower limb: Secondary | ICD-10-CM | POA: Diagnosis not present

## 2017-08-06 DIAGNOSIS — Z719 Counseling, unspecified: Secondary | ICD-10-CM | POA: Diagnosis not present

## 2017-08-13 DIAGNOSIS — Z719 Counseling, unspecified: Secondary | ICD-10-CM | POA: Diagnosis not present

## 2017-08-20 DIAGNOSIS — Z719 Counseling, unspecified: Secondary | ICD-10-CM | POA: Diagnosis not present

## 2017-11-10 DIAGNOSIS — E782 Mixed hyperlipidemia: Secondary | ICD-10-CM | POA: Diagnosis not present

## 2017-11-10 DIAGNOSIS — Z23 Encounter for immunization: Secondary | ICD-10-CM | POA: Diagnosis not present

## 2017-11-10 DIAGNOSIS — R739 Hyperglycemia, unspecified: Secondary | ICD-10-CM | POA: Diagnosis not present

## 2017-11-17 DIAGNOSIS — Z719 Counseling, unspecified: Secondary | ICD-10-CM | POA: Diagnosis not present

## 2017-11-24 DIAGNOSIS — Z719 Counseling, unspecified: Secondary | ICD-10-CM | POA: Diagnosis not present

## 2017-12-01 DIAGNOSIS — Z719 Counseling, unspecified: Secondary | ICD-10-CM | POA: Diagnosis not present

## 2017-12-08 DIAGNOSIS — Z719 Counseling, unspecified: Secondary | ICD-10-CM | POA: Diagnosis not present

## 2017-12-15 DIAGNOSIS — Z719 Counseling, unspecified: Secondary | ICD-10-CM | POA: Diagnosis not present

## 2017-12-22 DIAGNOSIS — Z719 Counseling, unspecified: Secondary | ICD-10-CM | POA: Diagnosis not present

## 2018-01-04 DIAGNOSIS — Z719 Counseling, unspecified: Secondary | ICD-10-CM | POA: Diagnosis not present

## 2018-01-14 DIAGNOSIS — Z719 Counseling, unspecified: Secondary | ICD-10-CM | POA: Diagnosis not present

## 2018-01-24 ENCOUNTER — Other Ambulatory Visit: Payer: Self-pay

## 2018-01-24 ENCOUNTER — Inpatient Hospital Stay: Payer: 59 | Attending: Hematology & Oncology | Admitting: Hematology & Oncology

## 2018-01-24 ENCOUNTER — Inpatient Hospital Stay: Payer: 59

## 2018-01-24 VITALS — BP 125/85 | HR 92 | Temp 97.8°F | Resp 20 | Wt 259.0 lb

## 2018-01-24 DIAGNOSIS — C8223 Follicular lymphoma grade III, unspecified, intra-abdominal lymph nodes: Secondary | ICD-10-CM

## 2018-01-24 DIAGNOSIS — Z9221 Personal history of antineoplastic chemotherapy: Secondary | ICD-10-CM

## 2018-01-24 DIAGNOSIS — Z87891 Personal history of nicotine dependence: Secondary | ICD-10-CM

## 2018-01-24 DIAGNOSIS — Z79899 Other long term (current) drug therapy: Secondary | ICD-10-CM

## 2018-01-24 DIAGNOSIS — Z8572 Personal history of non-Hodgkin lymphomas: Secondary | ICD-10-CM

## 2018-01-24 LAB — CBC WITH DIFFERENTIAL (CANCER CENTER ONLY)
Abs Immature Granulocytes: 0.01 10*3/uL (ref 0.00–0.07)
BASOS PCT: 1 %
Basophils Absolute: 0 10*3/uL (ref 0.0–0.1)
Eosinophils Absolute: 0.2 10*3/uL (ref 0.0–0.5)
Eosinophils Relative: 3 %
HCT: 40.5 % (ref 39.0–52.0)
Hemoglobin: 13.3 g/dL (ref 13.0–17.0)
IMMATURE GRANULOCYTES: 0 %
Lymphocytes Relative: 28 %
Lymphs Abs: 1.5 10*3/uL (ref 0.7–4.0)
MCH: 30.7 pg (ref 26.0–34.0)
MCHC: 32.8 g/dL (ref 30.0–36.0)
MCV: 93.5 fL (ref 80.0–100.0)
MONOS PCT: 8 %
Monocytes Absolute: 0.4 10*3/uL (ref 0.1–1.0)
NEUTROS ABS: 3.3 10*3/uL (ref 1.7–7.7)
Neutrophils Relative %: 60 %
PLATELETS: 217 10*3/uL (ref 150–400)
RBC: 4.33 MIL/uL (ref 4.22–5.81)
RDW: 12.1 % (ref 11.5–15.5)
WBC: 5.4 10*3/uL (ref 4.0–10.5)
nRBC: 0 % (ref 0.0–0.2)

## 2018-01-24 LAB — CMP (CANCER CENTER ONLY)
ALBUMIN: 4 g/dL (ref 3.5–5.0)
ALT: 18 U/L (ref 0–44)
ANION GAP: 7 (ref 5–15)
AST: 17 U/L (ref 15–41)
Alkaline Phosphatase: 102 U/L (ref 38–126)
BILIRUBIN TOTAL: 0.5 mg/dL (ref 0.3–1.2)
BUN: 17 mg/dL (ref 6–20)
CHLORIDE: 102 mmol/L (ref 98–111)
CO2: 29 mmol/L (ref 22–32)
Calcium: 9 mg/dL (ref 8.9–10.3)
Creatinine: 1.07 mg/dL (ref 0.61–1.24)
GFR, Est AFR Am: >60 mL/min (ref >60)
Glucose, Bld: 182 mg/dL — ABNORMAL HIGH (ref 70–99)
POTASSIUM: 4.5 mmol/L (ref 3.5–5.1)
Sodium: 138 mmol/L (ref 135–145)
TOTAL PROTEIN: 6.5 g/dL (ref 6.5–8.1)

## 2018-01-24 LAB — LACTATE DEHYDROGENASE: LDH: 150 U/L (ref 98–192)

## 2018-01-24 NOTE — Progress Notes (Signed)
Hematology and Oncology Follow Up Visit  James Jennings [id number] 01/12/1971 47 y.o. 01/24/2018   Principle Diagnosis:  Follicular large cell non-Hodgkin's lymphoma (FLIPI-2 = 0)  Current Therapy:   Rituxan/bendamustine-s/p cycle #6 (will hold Rituxan today) - last dose on 07/02/2016    Interim History:  James Jennings is back for follow-up.  Is been 9 months since I last saw him.  He has been doing quite well.  He comes in with his wife.  As always, we have a lot to talk about.  He is not smoking.  He stopped 8 months ago.  His wife is not smoking.  This is a fantastic job that they both have done.  He is trying to lose weight.  He gained quite a bit of weight after stopping cigarettes.  He travels quite a bit for his job.  This makes losing weight tough.  There is been no issues with fever.  He has had no rashes.  He has had no cough.  He is no headache.  Hopefully, he will lose weight.  Hopefully he will exercise.  Overall, his performance status is ECOG 0   Medications:  Allergies as of 01/24/2018      Reactions   Shellfish Allergy Anaphylaxis   Lactose Intolerance (gi) Diarrhea   Mango Flavor Swelling, Other (See Comments)   MANGO(S) LIPS SWELL      Medication List       Accurate as of January 24, 2018  7:59 AM. Always use your most recent med list.        acetaminophen 325 MG tablet Commonly known as:  TYLENOL Take 650 mg by mouth every 6 (six) hours as needed for moderate pain or headache.   amoxicillin-clavulanate 875-125 MG tablet Commonly known as:  AUGMENTIN Take 1 tablet by mouth 2 (two) times daily.   cetirizine 10 MG tablet Commonly known as:  ZYRTEC Take 10 mg by mouth daily as needed for allergies.   multivitamin Tabs tablet Take 1 tablet by mouth daily.   varenicline 0.5 MG X 11 & 1 MG X 42 tablet Commonly known as:  CHANTIX PAK Take by mouth 2 (two) times daily. Take one 0.5 mg tablet by mouth once daily for 3 days, then increase to one 0.5 mg  tablet twice daily for 4 days, then increase to one 1 mg tablet twice daily.       Allergies:  Allergies  Allergen Reactions  . Shellfish Allergy Anaphylaxis  . Lactose Intolerance (Gi) Diarrhea  . Mango Flavor Swelling and Other (See Comments)    MANGO(S) LIPS SWELL    Past Medical History, Surgical history, Social history, and Family History were reviewed and updated.  Review of Systems: Review of Systems  Constitutional: Negative.   Eyes: Negative.   Respiratory: Negative.   Cardiovascular: Negative.   Gastrointestinal: Negative.   Genitourinary: Negative.   Musculoskeletal: Negative.   Skin: Negative.   Neurological: Negative.   Endo/Heme/Allergies: Negative.   Psychiatric/Behavioral: Negative.     Physical Exam:  vitals were not taken for this visit.   Wt Readings from Last 3 Encounters:  04/02/17 257 lb 8 oz (116.8 kg)  12/03/16 246 lb (111.6 kg)  08/20/16 255 lb 1.9 oz (115.7 kg)    Physical Exam Vitals signs reviewed.  HENT:     Head: Normocephalic and atraumatic.  Eyes:     Pupils: Pupils are equal, round, and reactive to light.  Neck:     Musculoskeletal: Normal range of motion.  Cardiovascular:     Rate and Rhythm: Normal rate and regular rhythm.     Heart sounds: Normal heart sounds.  Pulmonary:     Effort: Pulmonary effort is normal.     Breath sounds: Normal breath sounds.  Abdominal:     General: Bowel sounds are normal.     Palpations: Abdomen is soft.  Musculoskeletal: Normal range of motion.        General: No tenderness or deformity.  Lymphadenopathy:     Cervical: No cervical adenopathy.  Skin:    General: Skin is warm and dry.     Findings: No erythema or rash.  Neurological:     Mental Status: He is alert and oriented to person, place, and time.  Psychiatric:        Behavior: Behavior normal.        Thought Content: Thought content normal.        Judgment: Judgment normal.      Lab Results  Component Value Date   WBC  5.4 01/24/2018   HGB 13.3 01/24/2018   HCT 40.5 01/24/2018   MCV 93.5 01/24/2018   PLT 217 01/24/2018   No results found for: FERRITIN, IRON, TIBC, UIBC, IRONPCTSAT Lab Results  Component Value Date   RETICCTPCT 1.07 12/06/2015   RBC 4.33 01/24/2018   RETICCTABS 48.79 12/06/2015   No results found for: Nils Pyle [facility] Lab Results  Component Value Date   IGGSERUM 785 06/04/2016   IGMSERUM 52 06/04/2016   No results found for: Odetta Pink, SPEI   Chemistry      Component Value Date/Time   NA 140 04/02/2017 1151   NA 141 12/03/2016 1155   NA 141 02/07/2016 1149   K 4.1 04/02/2017 1151   K 3.8 12/03/2016 1155   K 4.3 02/07/2016 1149   CL 102 04/02/2017 1151   CL 100 12/03/2016 1155   CO2 26 04/02/2017 1151   CO2 27 12/03/2016 1155   CO2 27 02/07/2016 1149   BUN 15 04/02/2017 1151   BUN 10 12/03/2016 1155   BUN 10.9 02/07/2016 1149   CREATININE 0.97 04/02/2017 1151   CREATININE 1.0 12/03/2016 1155   CREATININE 0.9 02/07/2016 1149      Component Value Date/Time   CALCIUM 9.6 04/02/2017 1151   CALCIUM 9.1 12/03/2016 1155   CALCIUM 9.6 02/07/2016 1149   ALKPHOS 130 04/02/2017 1151   ALKPHOS 99 (H) 12/03/2016 1155   ALKPHOS 127 02/07/2016 1149   AST 21 04/02/2017 1151   AST 17 02/07/2016 1149   ALT 22 04/02/2017 1151   ALT 24 12/03/2016 1155   ALT 16 02/07/2016 1149   BILITOT 0.5 04/02/2017 1151   BILITOT 0.42 02/07/2016 1149     Impression and Plan: [name] is a pleasant 48 yo white male with follicular large cell non-Hodgkin's lymphoma. He completed 6 cycles of chemotherapy with bendamustine on 07/03/2016. He was not tolerant of Rituxan.  From my point of view, everything looks fantastic.  I do not see any evidence of recurrent lymphoma.  I do not see any reason for [place] to do scans on him.  I will plan to see him back in another 6 months now.  [name], MD 12/23/20197:59  AM

## 2018-01-25 LAB — BETA 2 MICROGLOBULIN, SERUM: Beta-2 Microglobulin: 1.5 mg/L (ref 0.6–2.4)

## 2018-02-11 DIAGNOSIS — E782 Mixed hyperlipidemia: Secondary | ICD-10-CM | POA: Diagnosis not present

## 2018-02-11 DIAGNOSIS — E119 Type 2 diabetes mellitus without complications: Secondary | ICD-10-CM | POA: Diagnosis not present

## 2018-02-11 DIAGNOSIS — Z79899 Other long term (current) drug therapy: Secondary | ICD-10-CM | POA: Diagnosis not present

## 2018-05-18 DIAGNOSIS — E669 Obesity, unspecified: Secondary | ICD-10-CM | POA: Diagnosis not present

## 2018-05-18 DIAGNOSIS — E119 Type 2 diabetes mellitus without complications: Secondary | ICD-10-CM | POA: Diagnosis not present

## 2018-05-18 DIAGNOSIS — E782 Mixed hyperlipidemia: Secondary | ICD-10-CM | POA: Diagnosis not present

## 2018-07-25 ENCOUNTER — Other Ambulatory Visit: Payer: Self-pay | Admitting: *Deleted

## 2018-07-25 ENCOUNTER — Encounter: Payer: Self-pay | Admitting: Hematology & Oncology

## 2018-07-25 ENCOUNTER — Inpatient Hospital Stay: Payer: 59

## 2018-07-25 ENCOUNTER — Inpatient Hospital Stay: Payer: 59 | Attending: Hematology & Oncology | Admitting: Hematology & Oncology

## 2018-07-25 ENCOUNTER — Other Ambulatory Visit: Payer: Self-pay

## 2018-07-25 VITALS — BP 136/75 | HR 77 | Temp 98.8°F | Resp 18 | Wt 266.0 lb

## 2018-07-25 DIAGNOSIS — Z8572 Personal history of non-Hodgkin lymphomas: Secondary | ICD-10-CM | POA: Diagnosis not present

## 2018-07-25 DIAGNOSIS — C8223 Follicular lymphoma grade III, unspecified, intra-abdominal lymph nodes: Secondary | ICD-10-CM

## 2018-07-25 DIAGNOSIS — Z79899 Other long term (current) drug therapy: Secondary | ICD-10-CM | POA: Insufficient documentation

## 2018-07-25 DIAGNOSIS — Z9221 Personal history of antineoplastic chemotherapy: Secondary | ICD-10-CM | POA: Insufficient documentation

## 2018-07-25 LAB — LACTATE DEHYDROGENASE: LDH: 145 U/L (ref 98–192)

## 2018-07-25 LAB — CBC WITH DIFFERENTIAL (CANCER CENTER ONLY)
Abs Immature Granulocytes: 0.01 10*3/uL (ref 0.00–0.07)
Basophils Absolute: 0 10*3/uL (ref 0.0–0.1)
Basophils Relative: 0 %
Eosinophils Absolute: 0.2 10*3/uL (ref 0.0–0.5)
Eosinophils Relative: 4 %
HCT: 39.6 % (ref 39.0–52.0)
Hemoglobin: 13.3 g/dL (ref 13.0–17.0)
Immature Granulocytes: 0 %
Lymphocytes Relative: 33 %
Lymphs Abs: 1.6 10*3/uL (ref 0.7–4.0)
MCH: 31 pg (ref 26.0–34.0)
MCHC: 33.6 g/dL (ref 30.0–36.0)
MCV: 92.3 fL (ref 80.0–100.0)
Monocytes Absolute: 0.5 10*3/uL (ref 0.1–1.0)
Monocytes Relative: 10 %
Neutro Abs: 2.5 10*3/uL (ref 1.7–7.7)
Neutrophils Relative %: 53 %
Platelet Count: 197 10*3/uL (ref 150–400)
RBC: 4.29 MIL/uL (ref 4.22–5.81)
RDW: 12.4 % (ref 11.5–15.5)
WBC Count: 4.7 10*3/uL (ref 4.0–10.5)
nRBC: 0 % (ref 0.0–0.2)

## 2018-07-25 LAB — CMP (CANCER CENTER ONLY)
ALT: 18 U/L (ref 0–44)
AST: 18 U/L (ref 15–41)
Albumin: 4.2 g/dL (ref 3.5–5.0)
Alkaline Phosphatase: 93 U/L (ref 38–126)
Anion gap: 6 (ref 5–15)
BUN: 19 mg/dL (ref 6–20)
CO2: 31 mmol/L (ref 22–32)
Calcium: 9.3 mg/dL (ref 8.9–10.3)
Chloride: 103 mmol/L (ref 98–111)
Creatinine: 1.17 mg/dL (ref 0.61–1.24)
GFR, Est AFR Am: >60 mL/min (ref >60)
GFR, Estimated: >60 mL/min (ref >60)
Glucose, Bld: 132 mg/dL — ABNORMAL HIGH (ref 70–99)
Potassium: 5 mmol/L (ref 3.5–5.1)
Sodium: 140 mmol/L (ref 135–145)
Total Bilirubin: 0.5 mg/dL (ref 0.3–1.2)
Total Protein: 6.5 g/dL (ref 6.5–8.1)

## 2018-07-25 NOTE — Progress Notes (Signed)
Hematology and Oncology Follow Up Visit  James Jennings [id number] [birth year] 48 y.o. 07/25/2018   Principle Diagnosis:  Follicular large cell non-Hodgkin's lymphoma (FLIPI-2 = 0)  Current Therapy:   Rituxan/bendamustine-s/p cycle #6 (will hold Rituxan today) - last dose on 07/02/2016    Interim History:  James Jennings is back for follow-up.  He is doing pretty well.  Because of the coronavirus, he is not been able to travel for work.  Last travel back in March.  Both he and his wife are home working.  He is gaining weight.  He really wants to try to lose some weight.  Glad to see if there is a weight loss program that he can attend.  He is tried exercise.  He has had no problems with nausea or vomiting.  He has had no fever.  He has had no cough.  He is not smoking cigarettes any longer.  He has not noted any swollen lymph nodes.  Overall, his performance status is ECOG 0   Medications:  Allergies as of 07/25/2018      Reactions   Shellfish Allergy Anaphylaxis   Lactose Intolerance (gi) Diarrhea   Mango Flavor Swelling, Other (See Comments)   MANGO(S) LIPS SWELL      Medication List       Accurate as of July 25, 2018  8:18 AM. If you have any questions, ask your nurse or doctor.        acetaminophen 325 MG tablet Commonly known as: TYLENOL Take 650 mg by mouth every 6 (six) hours as needed for moderate pain or headache.   atorvastatin 10 MG tablet Commonly known as: LIPITOR Take 10 mg by mouth daily.   cetirizine 10 MG tablet Commonly known as: ZYRTEC Take 10 mg by mouth daily as needed for allergies.   multivitamin Tabs tablet Take 1 tablet by mouth daily.       Allergies:  Allergies  Allergen Reactions  . Shellfish Allergy Anaphylaxis  . Lactose Intolerance (Gi) Diarrhea  . Mango Flavor Swelling and Other (See Comments)    MANGO(S) LIPS SWELL    Past Medical History, Surgical history, Social history, and Family History were reviewed and  updated.  Review of Systems: Review of Systems  Constitutional: Negative.   Eyes: Negative.   Respiratory: Negative.   Cardiovascular: Negative.   Gastrointestinal: Negative.   Genitourinary: Negative.   Musculoskeletal: Negative.   Skin: Negative.   Neurological: Negative.   Endo/Heme/Allergies: Negative.   Psychiatric/Behavioral: Negative.     Physical Exam:  weight is 266 lb (120.7 kg). His temperature is 98.8 F (37.1 C). His blood pressure is 136/75 and his pulse is 77. His respiration is 18.   Wt Readings from Last 3 Encounters:  07/25/18 266 lb (120.7 kg)  01/24/18 259 lb (117.5 kg)  04/02/17 257 lb 8 oz (116.8 kg)    Physical Exam Vitals signs reviewed.  HENT:     Head: Normocephalic and atraumatic.  Eyes:     Pupils: Pupils are equal, round, and reactive to light.  Neck:     Musculoskeletal: Normal range of motion.  Cardiovascular:     Rate and Rhythm: Normal rate and regular rhythm.     Heart sounds: Normal heart sounds.  Pulmonary:     Effort: Pulmonary effort is normal.     Breath sounds: Normal breath sounds.  Abdominal:     General: Bowel sounds are normal.     Palpations: Abdomen is soft.  Musculoskeletal: Normal range of  motion.        General: No tenderness or deformity.  Lymphadenopathy:     Cervical: No cervical adenopathy.  Skin:    General: Skin is warm and dry.     Findings: No erythema or rash.  Neurological:     Mental Status: He is alert and oriented to person, place, and time.  Psychiatric:        Behavior: Behavior normal.        Thought Content: Thought content normal.        Judgment: Judgment normal.      Lab Results  Component Value Date   WBC 4.7 07/25/2018   HGB 13.3 07/25/2018   HCT 39.6 07/25/2018   MCV 92.3 07/25/2018   PLT 197 07/25/2018   No results found for: FERRITIN, IRON, TIBC, UIBC, IRONPCTSAT Lab Results  Component Value Date   RETICCTPCT 1.07 12/06/2015   RBC 4.29 07/25/2018   RETICCTABS 48.79  12/06/2015   No results found for: Nils Pyle [facility] Lab Results  Component Value Date   IGGSERUM 785 06/04/2016   IGMSERUM 52 06/04/2016   No results found for: Odetta Pink, SPEI   Chemistry      Component Value Date/Time   NA 138 01/24/2018 0740   NA 141 12/03/2016 1155   NA 141 02/07/2016 1149   K 4.5 01/24/2018 0740   K 3.8 12/03/2016 1155   K 4.3 02/07/2016 1149   CL 102 01/24/2018 0740   CL 100 12/03/2016 1155   CO2 29 01/24/2018 0740   CO2 27 12/03/2016 1155   CO2 27 02/07/2016 1149   BUN 17 01/24/2018 0740   BUN 10 12/03/2016 1155   BUN 10.9 02/07/2016 1149   CREATININE 1.07 01/24/2018 0740   CREATININE 1.0 12/03/2016 1155   CREATININE 0.9 02/07/2016 1149      Component Value Date/Time   CALCIUM 9.0 01/24/2018 0740   CALCIUM 9.1 12/03/2016 1155   CALCIUM 9.6 02/07/2016 1149   ALKPHOS 102 01/24/2018 0740   ALKPHOS 99 (H) 12/03/2016 1155   ALKPHOS 127 02/07/2016 1149   AST 17 01/24/2018 0740   AST 17 02/07/2016 1149   ALT 18 01/24/2018 0740   ALT 24 12/03/2016 1155   ALT 16 02/07/2016 1149   BILITOT 0.5 01/24/2018 0740   BILITOT 0.42 02/07/2016 1149     Impression and Plan: James Jennings is a pleasant 48 yo white male with follicular large cell non-Hodgkin's lymphoma. He completed 6 cycles of chemotherapy with bendamustine on 07/03/2016. He was not tolerant of Rituxan.  From my point of view, everything looks fantastic.  I do not see any evidence of recurrent lymphoma.  I do not see any reason for [place] to do scans on him.  We will see if we can get him into a weight loss program.  I will plan to see him back in another 6 months now.  [name], MD 6/22/20208:18 AM

## 2018-07-26 LAB — BETA 2 MICROGLOBULIN, SERUM: Beta-2 Microglobulin: 1.6 mg/L (ref 0.6–2.4)

## 2018-08-10 ENCOUNTER — Other Ambulatory Visit: Payer: Self-pay

## 2018-08-10 ENCOUNTER — Ambulatory Visit (INDEPENDENT_AMBULATORY_CARE_PROVIDER_SITE_OTHER): Payer: 59 | Admitting: Family Medicine

## 2018-08-10 ENCOUNTER — Encounter (INDEPENDENT_AMBULATORY_CARE_PROVIDER_SITE_OTHER): Payer: Self-pay | Admitting: Family Medicine

## 2018-08-10 VITALS — BP 121/75 | HR 74 | Temp 97.8°F | Ht 71.0 in | Wt 266.0 lb

## 2018-08-10 DIAGNOSIS — R0602 Shortness of breath: Secondary | ICD-10-CM

## 2018-08-10 DIAGNOSIS — R739 Hyperglycemia, unspecified: Secondary | ICD-10-CM | POA: Diagnosis not present

## 2018-08-10 DIAGNOSIS — Z1331 Encounter for screening for depression: Secondary | ICD-10-CM

## 2018-08-10 DIAGNOSIS — Z6837 Body mass index (BMI) 37.0-37.9, adult: Secondary | ICD-10-CM

## 2018-08-10 DIAGNOSIS — Z9189 Other specified personal risk factors, not elsewhere classified: Secondary | ICD-10-CM

## 2018-08-10 DIAGNOSIS — R5383 Other fatigue: Secondary | ICD-10-CM

## 2018-08-10 DIAGNOSIS — Z0289 Encounter for other administrative examinations: Secondary | ICD-10-CM

## 2018-08-10 NOTE — Progress Notes (Signed)
Office: [phone]  /  Fax: [phone]   Dear Dr. Marin Olp,   Thank you for referring Johanthan Kneeland to our clinic. The following note includes my evaluation and treatment recommendations.  HPI:   Chief Complaint: OBESITY    James Jennings has been referred by Rudell Cobb. Marin Olp, MD for consultation regarding his obesity and obesity related comorbidities.    Tawan Degroote (MR# [id number]) is a 48 y.o. male who presents on 08/10/2018 for obesity evaluation and treatment. Current BMI is Body mass index is 37.1 kg/m.[name] Deshone has been struggling with his weight for many years and has been unsuccessful in either losing weight, maintaining weight loss, or reaching his healthy weight goal.     Randall Hiss attended our information session and states he is currently in the action stage of change and ready to dedicate time achieving and maintaining a healthier weight. Dyshawn is interested in becoming our patient and working on intensive lifestyle modifications including (but not limited to) diet, exercise and weight loss.    Archie states his family eats meals together he thinks his family will eat healthier with  him his desired weight loss is 71 lbs. he started gaining weight in 2001(ish) his heaviest weight ever was 278 lbs. he has a history of lactose intolerance and he is able to tolerate dairy in small quantities he has coffee with cream (splash), splenda (64 oz), banana, oatmeal-2 packs of brown sugar (satisfied) for breakfast he eats lunch at 1:00 PM; sandwich with 2 ounces of [place], mayonnaise (2 tablespoons), Pringle[employer] chips (satisfied). he has 3 slices of pepperoni pizza with extra cheese (Domino[employer]) for dinner, and after dinner a snack of 3 to 6 Oreo's. he is a picky eater and doesn't like to eat healthier foods  he has significant food cravings issues  he snacks frequently in the evenings he skips meals frequently he is frequently drinking liquids with calories he frequently makes poor food choices he has  problems with excessive hunger  he frequently eats larger portions than normal  he has binge eating behaviors he struggles with emotional eating    Fatigue Eyan feels his energy is lower than it should be. This has worsened with weight gain and has not worsened recently. Turrell admits to daytime somnolence and he admits to waking up still tired. Patient is at risk for obstructive sleep apnea. Patent has a history of symptoms of daytime fatigue and morning fatigue. Patient generally gets 6 to 8 hours of sleep per night, and states they generally have restful sleep. Snoring is present. Apneic episodes are present. EKG was ordered today, which shows normal sinus rhythm at 75 BPM. Epworth Sleepiness Score is 6  Dyspnea on exertion Randall Hiss notes increasing shortness of breath with exercising and seems to be worsening over time with weight gain. He notes getting out of breath sooner with activity than he used to. This has not gotten worse recently. Shaquille denies orthopnea. EKG was ordered today, which shows NSR at 75 BPM.  Elevated Blood Sugar Daltin had fasting blood sugar of 132 on follow up with Dr. Marin Olp. He has no diagnosis.  At risk for diabetes Cotton is at higher than average risk for developing diabetes due to his obesity and elevated blood sugar. He currently denies polyuria or polydipsia.  Depression Screen Alassane's Food and Mood (modified PHQ-9) score was  Depression screen PHQ 2/9 08/10/2018  Decreased Interest 1  Down, Depressed, Hopeless 2  PHQ - 2 Score 3  Altered sleeping 0  Tired, decreased  energy 3  Change in appetite 2  Feeling bad or failure about yourself  3  Trouble concentrating 0  Moving slowly or fidgety/restless 1  Suicidal thoughts 0  PHQ-9 Score 12  Difficult doing work/chores Not difficult at all    ASSESSMENT AND PLAN:  Other fatigue - Plan: EKG 12-Lead, Vitamin B12, CBC With Differential, Comprehensive metabolic panel, Folate, T3, T4, free, TSH, VITAMIN D 25 Hydroxy  (Vit-D Deficiency, Fractures)  Shortness of breath on exertion - Plan: Lipid Panel With LDL/HDL Ratio  Elevated blood sugar - Plan: Hemoglobin A1c, Insulin, random  Depression screening  At risk for diabetes mellitus  Class 2 severe obesity with serious comorbidity and body mass index (BMI) of 37.0 to 37.9 in adult, unspecified obesity type ([name])  PLAN:  Fatigue Dejuan was informed that his fatigue may be related to obesity, depression or many other causes. Labs, EKG and indirect calorimetry will be ordered, and in the meanwhile Shailen has agreed to work on diet, exercise and weight loss to help with fatigue. Proper sleep hygiene was discussed including the need for 7-8 hours of quality sleep each night. A sleep study was not ordered based on symptoms and Epworth score.  Dyspnea on exertion Keath's shortness of breath appears to be obesity related and exercise induced. He has agreed to work on weight loss and gradually increase exercise to treat his exercise induced shortness of breath. EKG, indirect calorimetry and labs will be ordered today. If Kyjuan follows our instructions and loses weight without improvement of his shortness of breath, we will plan to refer to pulmonology. We will monitor this condition regularly. Jama agrees to this plan.  Elevated Blood Sugar We will check Hgb A1c and insulin level today and Colonel will follow up with our clinic in 2 weeks.  Diabetes risk counseling Dyshawn was given extended (15 minutes) diabetes prevention counseling today. He is 48 y.o. male and has risk factors for diabetes including obesity and elevated blood sugar. We discussed intensive lifestyle modifications today with an emphasis on weight loss as well as increasing exercise and decreasing simple carbohydrates in his diet.  Depression Screen Joahan had a moderately positive depression screening. Depression is commonly associated with obesity and often results in emotional eating behaviors. We will  monitor this closely and work on CBT to help improve the non-hunger eating patterns. Referral to Psychology may be required if no improvement is seen as he continues in our clinic.  Obesity Okey is currently in the action stage of change and his goal is to continue with weight loss efforts. I recommend Song begin the structured treatment plan as follows:  He has agreed to follow the category 4 plan  Ary has been instructed to eventually work up to a goal of 150 minutes of combined cardio and strengthening exercise per week for weight loss and overall health benefits. We discussed the following Behavioral Modification Strategies today: planning for success, keeping healthy foods in the home, increasing lean protein intake, increasing vegetables and work on meal planning and easy cooking plans   He was informed of the importance of frequent follow up visits to maximize his success with intensive lifestyle modifications for his multiple health conditions. He was informed we would discuss his lab results at his next visit unless there is a critical issue that needs to be addressed sooner. Moroni agreed to keep his next visit at the agreed upon time to discuss these results.  ALLERGIES: Allergies  Allergen Reactions  . Shellfish Allergy  Anaphylaxis  . Lactose Intolerance (Gi) Diarrhea  . Mango Flavor Swelling and Other (See Comments)    MANGO(S) LIPS SWELL    MEDICATIONS: Current Outpatient Medications on File Prior to Visit  Medication Sig Dispense Refill  . atorvastatin (LIPITOR) 10 MG tablet Take 10 mg by mouth daily.    . cetirizine (ZYRTEC) 10 MG tablet Take 10 mg by mouth daily as needed for allergies.    [name] ibuprofen (ADVIL) 400 MG tablet Take 400 mg by mouth once.    . multivitamin (ONE-A-DAY MEN'S) TABS tablet Take 1 tablet by mouth daily.    [name] acetaminophen (TYLENOL) 325 MG tablet Take 650 mg by mouth every 6 (six) hours as needed for moderate pain or headache.     No current  facility-administered medications on file prior to visit.     PAST MEDICAL HISTORY: Past Medical History:  Diagnosis Date  . Bilateral swelling of feet   . High cholesterol   . Joint pain   . Lactose intolerance   . Lymphoma, follicular ([place]) dx'd 25/0539  . Multiple food allergies   . PONV (postoperative nausea and vomiting)   . Sleep apnea    "dx'd ~ 2008; never RX'd mask" (02/28/2016)    PAST SURGICAL HISTORY: Past Surgical History:  Procedure Laterality Date  . ACHILLES TENDON SURGERY Left ~ 2012  . APPENDECTOMY  11/12/2015   lap appy  . CLUB FOOT RELEASE Bilateral 1971  . HERNIA REPAIR    . INGUINAL LYMPH NODE BIOPSY Right 01/20/2016   Procedure: EXCISIONAL BIOPSY OF RIGHT INGUINAL LYMPH NODE;  Surgeon: Greer Pickerel, MD;  Location: [facility];  Service: General;  Laterality: Right;  . LAPAROSCOPIC APPENDECTOMY N/A 11/12/2015   Procedure: APPENDECTOMY LAPAROSCOPIC;  Surgeon: Greer Pickerel, MD;  Location: [facility];  Service: General;  Laterality: N/A;  . SUTURE REMOVAL Left ~ 2016-2017 X 3   "had to use permanent sutures w/my achilles OR; my body rejects them at times & I have to have them surgically removed; under anesthesia"  . VENTRAL HERNIA REPAIR  1994    SOCIAL HISTORY: Social History   Tobacco Use  . Smoking status: Current Every Day Smoker    Packs/day: 1.00    Years: 30.00    Pack years: 30.00    Types: Cigarettes  . Smokeless tobacco: Former Systems developer    Types: Snuff    Quit date: 66  . Tobacco comment: 11/12/2015 "quit using chew in ~ 1997"  Substance Use Topics  . Alcohol use: Yes    Alcohol/week: 4.0 standard drinks    Types: 4 Shots of liquor per week  . Drug use: Yes    Types: Marijuana    Comment: "in the early 1990s; recreational"    FAMILY HISTORY: History reviewed. No pertinent family history.  ROS: Review of Systems  Constitutional: Positive for malaise/fatigue.  HENT: Positive for hearing loss.   Eyes:       + Wear Glasses or Contacts   Respiratory: Positive for shortness of breath (with activity).   Cardiovascular:       Positive for Calf/LEg Pain with Walking  Genitourinary: Negative for frequency.  Musculoskeletal:       Positive for Muscle or Joint Pain Positive for Red or Swollen Joints  Skin:       Positive for Dryness  Neurological: Positive for weakness.  Endo/Heme/Allergies: Negative for polydipsia.       Positive for Heat or Cold Intolerance  Psychiatric/Behavioral: Positive for depression.  Positive for Stress    PHYSICAL EXAM: Blood pressure 121/75, pulse 74, temperature 97.8 F (36.6 C), temperature source Oral, height 5\' 11"  (1.803 m), weight 266 lb (120.7 kg), SpO2 97 %. Body mass index is 37.1 kg/m. Physical Exam Vitals signs reviewed.  Constitutional:      Appearance: He is well-developed. He is obese.  HENT:     Head: Normocephalic and atraumatic.     Nose: Nose normal.  Eyes:     Extraocular Movements:     Right eye: Normal extraocular motion.     Left eye: Normal extraocular motion.  Neck:     Musculoskeletal: Normal range of motion and neck supple.     Thyroid: No thyromegaly.  Cardiovascular:     Rate and Rhythm: Normal rate and regular rhythm.  Pulmonary:     Effort: Pulmonary effort is normal. No respiratory distress.  Abdominal:     Palpations: Abdomen is soft.     Tenderness: There is no abdominal tenderness.  Musculoskeletal: Normal range of motion.     Comments: Range of Motion normal in all 4 extremities  Skin:    General: Skin is warm and dry.  Neurological:     Mental Status: He is alert and oriented to person, place, and time.  Psychiatric:        Behavior: Behavior normal.     RECENT LABS AND TESTS: BMET    Component Value Date/Time   NA 140 07/25/2018 0750   NA 141 12/03/2016 1155   NA 141 02/07/2016 1149   K 5.0 07/25/2018 0750   K 3.8 12/03/2016 1155   K 4.3 02/07/2016 1149   CL 103 07/25/2018 0750   CL 100 12/03/2016 1155   CO2 31 07/25/2018  0750   CO2 27 12/03/2016 1155   CO2 27 02/07/2016 1149   GLUCOSE 132 (H) 07/25/2018 0750   GLUCOSE 128 (H) 12/03/2016 1155   BUN 19 07/25/2018 0750   BUN 10 12/03/2016 1155   BUN 10.9 02/07/2016 1149   CREATININE 1.17 07/25/2018 0750   CREATININE 1.0 12/03/2016 1155   CREATININE 0.9 02/07/2016 1149   CALCIUM 9.3 07/25/2018 0750   CALCIUM 9.1 12/03/2016 1155   CALCIUM 9.6 02/07/2016 1149   GFRNONAA >60 07/25/2018 0750   GFRAA >60 07/25/2018 0750   No results found for: HGBA1C No results found for: INSULIN CBC    Component Value Date/Time   WBC 4.7 07/25/2018 0750   WBC 4.1 02/29/2016 0720   RBC 4.29 07/25/2018 0750   HGB 13.3 07/25/2018 0750   HGB 14.4 12/03/2016 1155   HGB 14.2 12/06/2015 1606   HCT 39.6 07/25/2018 0750   HCT 41.1 12/03/2016 1155   HCT 40.9 12/06/2015 1606   PLT 197 07/25/2018 0750   PLT 227 12/03/2016 1155   PLT 257 12/06/2015 1606   MCV 92.3 07/25/2018 0750   MCV 92 12/03/2016 1155   MCV 89.7 12/06/2015 1606   MCH 31.0 07/25/2018 0750   MCHC 33.6 07/25/2018 0750   RDW 12.4 07/25/2018 0750   RDW 12.9 12/03/2016 1155   RDW 13.0 12/06/2015 1606   LYMPHSABS 1.6 07/25/2018 0750   LYMPHSABS 1.1 12/03/2016 1155   LYMPHSABS 3.1 12/06/2015 1606   MONOABS 0.5 07/25/2018 0750   MONOABS 0.7 12/06/2015 1606   EOSABS 0.2 07/25/2018 0750   EOSABS 0.3 12/03/2016 1155   BASOSABS 0.0 07/25/2018 0750   BASOSABS 0.0 12/03/2016 1155   BASOSABS 0.0 12/06/2015 1606   Iron/TIBC/Ferritin/ %Sat No results found for:  IRON, TIBC, FERRITIN, IRONPCTSAT Lipid Panel  No results found for: CHOL, TRIG, HDL, CHOLHDL, VLDL, LDLCALC, LDLDIRECT Hepatic Function Panel     Component Value Date/Time   PROT 6.5 07/25/2018 0750   PROT 7.1 12/03/2016 1155   PROT 7.4 02/07/2016 1149   ALBUMIN 4.2 07/25/2018 0750   ALBUMIN 4.1 12/03/2016 1155   ALBUMIN 3.9 02/07/2016 1149   AST 18 07/25/2018 0750   AST 17 02/07/2016 1149   ALT 18 07/25/2018 0750   ALT 24 12/03/2016 1155    ALT 16 02/07/2016 1149   ALKPHOS 93 07/25/2018 0750   ALKPHOS 99 (H) 12/03/2016 1155   ALKPHOS 127 02/07/2016 1149   BILITOT 0.5 07/25/2018 0750   BILITOT 0.42 02/07/2016 1149   No results found for: TSH  ECG  shows NSR with a rate of 75 BPM INDIRECT CALORIMETER done today shows a VO2 of 353 and a REE of 2457.  His calculated basal metabolic rate is 6962 thus his basal metabolic rate is better than expected.      OBESITY BEHAVIORAL INTERVENTION VISIT  Today's visit was # 1   Starting weight: 266 lbs Starting date: 08/10/2018 Today's weight : 266 lbs Today's date: 08/10/2018 Total lbs lost to date: 0    08/10/2018  Height 5\' 11"  (1.803 m)  Weight 266 lb (120.7 kg)  BMI (Calculated) 37.12  BLOOD PRESSURE - SYSTOLIC 952  BLOOD PRESSURE - DIASTOLIC 75  Waist Measurement  52 inches   Body Fat % 45.6 %  Total Body Water (lbs) 98 lbs  RMR 2457    ASK: We discussed the diagnosis of obesity with Howie Ill today and Tagg agreed to give [place] permission to discuss obesity behavioral modification therapy today.  ASSESS: Arek has the diagnosis of obesity and his BMI today is 37.12 Kolton is in the action stage of change   ADVISE: Darcell was educated on the multiple health risks of obesity as well as the benefit of weight loss to improve his health. He was advised of the need for long term treatment and the importance of lifestyle modifications to improve his current health and to decrease his risk of future health problems.  AGREE: Multiple dietary modification options and treatment options were discussed and  Jahmar agreed to follow the recommendations documented in the above note.  ARRANGE: Antony was educated on the importance of frequent visits to treat obesity as outlined per CMS and USPSTF guidelines and agreed to schedule his next follow up appointment today.  I, Doreene Nest, am acting as transcriptionist for [name] Jones, MD   I have reviewed the above documentation for  accuracy and completeness, and I agree with the above. - Ilene Qua, MD

## 2018-08-11 LAB — CBC WITH DIFFERENTIAL
Basophils Absolute: 0 10*3/uL (ref 0.0–0.2)
Basos: 0 %
EOS (ABSOLUTE): 0.2 10*3/uL (ref 0.0–0.4)
Eos: 3 %
Hematocrit: 41.3 % (ref 37.5–51.0)
Hemoglobin: 14 g/dL (ref 13.0–17.7)
Immature Grans (Abs): 0 10*3/uL (ref 0.0–0.1)
Immature Granulocytes: 0 %
Lymphocytes Absolute: 1.5 10*3/uL (ref 0.7–3.1)
Lymphs: 30 %
MCH: 31.1 pg (ref 26.6–33.0)
MCHC: 33.9 g/dL (ref 31.5–35.7)
MCV: 92 fL (ref 79–97)
Monocytes Absolute: 0.4 10*3/uL (ref 0.1–0.9)
Monocytes: 9 %
Neutrophils Absolute: 2.8 10*3/uL (ref 1.4–7.0)
Neutrophils: 58 %
RBC: 4.5 x10E6/uL (ref 4.14–5.80)
RDW: 12.8 % (ref 11.6–15.4)
WBC: 4.9 10*3/uL (ref 3.4–10.8)

## 2018-08-11 LAB — LIPID PANEL WITH LDL/HDL RATIO
Cholesterol, Total: 170 mg/dL (ref 100–199)
HDL: 46 mg/dL (ref >39)
LDL Calculated: 97 mg/dL (ref 0–99)
LDl/HDL Ratio: 2.1 ratio (ref 0.0–3.6)
Triglycerides: 134 mg/dL (ref 0–149)
VLDL Cholesterol Cal: 27 mg/dL (ref 5–40)

## 2018-08-11 LAB — FOLATE: Folate: 17.6 ng/mL (ref >3.0)

## 2018-08-11 LAB — COMPREHENSIVE METABOLIC PANEL
ALT: 20 IU/L (ref 0–44)
AST: 20 IU/L (ref 0–40)
Albumin/Globulin Ratio: 2.1 (ref 1.2–2.2)
Albumin: 4.4 g/dL (ref 4.0–5.0)
Alkaline Phosphatase: 108 IU/L (ref 39–117)
BUN/Creatinine Ratio: 15 (ref 9–20)
BUN: 12 mg/dL (ref 6–24)
Bilirubin Total: 0.4 mg/dL (ref 0.0–1.2)
CO2: 23 mmol/L (ref 20–29)
Calcium: 8.9 mg/dL (ref 8.7–10.2)
Chloride: 103 mmol/L (ref 96–106)
Creatinine, Ser: 0.8 mg/dL (ref 0.76–1.27)
GFR calc Af Amer: 121 mL/min/{1.73_m2} (ref >59)
GFR calc non Af Amer: 105 mL/min/{1.73_m2} (ref >59)
Globulin, Total: 2.1 g/dL (ref 1.5–4.5)
Glucose: 109 mg/dL — ABNORMAL HIGH (ref 65–99)
Potassium: 4.3 mmol/L (ref 3.5–5.2)
Sodium: 143 mmol/L (ref 134–144)
Total Protein: 6.5 g/dL (ref 6.0–8.5)

## 2018-08-11 LAB — VITAMIN D 25 HYDROXY (VIT D DEFICIENCY, FRACTURES): Vit D, 25-Hydroxy: 51.2 ng/mL (ref 30.0–100.0)

## 2018-08-11 LAB — HEMOGLOBIN A1C
Est. average glucose Bld gHb Est-mCnc: 131 mg/dL
Hgb A1c MFr Bld: 6.2 % — ABNORMAL HIGH (ref 4.8–5.6)

## 2018-08-11 LAB — INSULIN, RANDOM: INSULIN: 24.6 u[IU]/mL (ref 2.6–24.9)

## 2018-08-11 LAB — TSH: TSH: 2.4 u[IU]/mL (ref 0.450–4.500)

## 2018-08-11 LAB — VITAMIN B12: Vitamin B-12: 653 pg/mL (ref 232–1245)

## 2018-08-11 LAB — T4, FREE: Free T4: 0.95 ng/dL (ref 0.82–1.77)

## 2018-08-11 LAB — T3: T3, Total: 147 ng/dL (ref 71–180)

## 2018-08-17 ENCOUNTER — Telehealth (INDEPENDENT_AMBULATORY_CARE_PROVIDER_SITE_OTHER): Payer: Self-pay | Admitting: Psychology

## 2018-08-17 ENCOUNTER — Ambulatory Visit (INDEPENDENT_AMBULATORY_CARE_PROVIDER_SITE_OTHER): Payer: 59 | Admitting: Psychology

## 2018-08-17 ENCOUNTER — Other Ambulatory Visit: Payer: Self-pay

## 2018-08-17 DIAGNOSIS — F3289 Other specified depressive episodes: Secondary | ICD-10-CM

## 2018-08-17 NOTE — Telephone Encounter (Signed)
  Office: [phone]  /  Fax: [phone]  Date of Call: August 17, 2018  Time of Call: 3:55pm Provider: Glennie Isle, PsyD  CONTENT: This provider called to discuss signing an authorization to speak with his wife. A HIPAA compliant voicemail was left requesting a call back.   PLAN: This provider will wait for Leonidus to call back.

## 2018-08-17 NOTE — Progress Notes (Signed)
Office: [phone]  /  Fax: [phone]    Date: August 17, 2018    Appointment Start Time: 11:06am Duration: 33 minutes Provider: Glennie Isle, Psy.D. Type of Session: Intake for Individual Therapy  Location of Patient: Home Location of Provider: Provider's Home Type of Contact: Telepsychological Visit via [employer] Web[employer]  Informed Consent: Today's appointment was initiated late due to technical difficulties. Prior to proceeding with today's appointment, two pieces of identifying information were obtained from Randall Hiss to verify identity. In addition, Elgie's physical location at the time of this appointment was obtained. Jayse reported he was at home and provided the address. In the event of technical difficulties, Sinjin shared a phone number he could be reached at. Randall Hiss and this provider participated in today's telepsychological service. Also, Marlos denied anyone else being present in the room or on the WebEx appointment.   The provider's role was explained to [facility]. The provider reviewed and discussed issues of confidentiality, privacy, and limits therein (e.g., reporting obligations). In addition to verbal informed consent, written informed consent for psychological services was obtained from [place] prior to the initial intake interview. Written consent included information concerning the practice, financial arrangements, and confidentiality and patients' rights. Since the clinic is not a 24/7 crisis center, mental health emergency resources were shared, and the provider explained MyChart, e-mail, voicemail, and/or other messaging systems should be utilized only for non-emergency reasons. This provider also explained that information obtained during appointments will be placed in [place] record in a confidential manner and relevant information will be shared with other providers at [facility] that he meets with for coordination of care. Yer verbally acknowledged understanding of  the aforementioned, and agreed to use mental health emergency resources discussed if needed. Moreover, Muhamad agreed information may be shared with other [facility] providers as needed for coordination of care. By signing the service agreement document, Cabot provided written consent for coordination of care.   Prior to initiating telepsychological services, Fedrick was provided with an informed consent document, which included the development of a safety plan (i.e., an emergency contact and emergency resources) in the event of an emergency/crisis. Anastasio expressed understanding of the rationale of the safety plan and provided consent for this provider to reach out to his emergency contact in the event of an emergency/crisis. Carmeron returned the completed consent form prior to today's appointment. This provider verbally reviewed the consent form during today's appointment prior to proceeding with the appointment. Bennie verbally acknowledged understanding that he is ultimately responsible for understanding his insurance benefits as it relates to reimbursement of telepsychological and in-person services. This provider also reviewed confidentiality, as it relates to telepsychological services, as well as the rationale for telepsychological services. More specifically, this provider's clinic is limiting in-person visits due to COVID-19. Therapeutic services will resume to in-person appointments once deemed appropriate. Garrett expressed understanding regarding the rationale for telepsychological services. In addition, this provider explained the telepsychological services informed consent document would be considered an addendum to the initial consent document/service agreement. Hesston verbally consented to proceed.   Chief Complaint/HPI: Vihan was referred by Dr. Ilene Qua. During the initial appointment with Dr. Ilene Qua at [facility] on August 10, 2018, Axtyn reported experiencing the  following: significant food cravings issues , snacking frequently in the evenings, frequently drinking liquids with calories, frequently making poor food choices, frequently eating larger portions than normal , binge eating behaviors and skipping meals frequently.   During today's appointment,  Daveion stated he eats when he is bored and is stressed. Broderic was verbally administered a questionnaire assessing various behaviors related to emotional eating. Ahmad endorsed the following: overeat when you are celebrating, experience food cravings on a regular basis, eat certain foods when you are anxious, stressed, depressed, or your feelings are hurt, find food is comforting to you, overeat when you are worried about something, overeat frequently when you are bored or lonely, not worry about what you eat when you are in a good mood and overeat when you are alone, but eat much less when you are with other people. Daxton believes the onset of emotional eating was likely in childhood. Currently, he described the frequency of emotional eating as "not that often." However, he acknowledged snacking in the evenings. He denied engaging in binge eating. Kiven denied a history of restricting food intake, purging and engagement in other compensatory strategies, and has never been diagnosed with an eating disorder. He also denied a history of treatment for emotional eating. He shared he craves sweets, such as donuts and chocolate. Moreover, Ira indicated stress and boredom triggers emotional eating, whereas staying active makes emotional eating better. Furthermore, Mckale denied other problems of concern.    Mental Status Examination:  Appearance: neat Behavior: cooperative Mood: euthymic Affect: mood congruent Speech: normal in rate, volume, and tone Eye Contact: appropriate Psychomotor Activity: appropriate Thought Process: linear, logical, and goal directed  Content/Perceptual Disturbances: denies suicidal and homicidal  ideation, plan, and intent and no hallucinations, delusions, bizarre thinking or behavior reported or observed Orientation: time, person, place and purpose of appointment Cognition/Sensorium: memory, attention, language, and fund of knowledge intact  Insight: good Judgment: good  Family & Psychosocial History: Claudia reported he is married and has one son (age 30). He indicated this provider meets with his wife. Due to confidentiality, this provider noted she could neither confirm nor deny the aforementioned. 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 Additionally, Tavin shared his highest level of education obtained is a master's degree. Currently, Sajan's social support system consists of his wife and co-workers. Moreover, Tennyson stated he resides with his wife, son, and 2 cats.   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   Multiple food allergies    PONV (postoperative nausea and vomiting)    Sleep apnea    "dx'd ~ 2008; never VF'I mask" (02/28/2016)   Past Surgical History:  Procedure Laterality Date   ACHILLES TENDON SURGERY Left ~ 2012   APPENDECTOMY  11/12/2015   lap appy   CLUB FOOT RELEASE Bilateral 1971   HERNIA REPAIR     INGUINAL LYMPH NODE BIOPSY Right 01/20/2016   Procedure: EXCISIONAL BIOPSY OF RIGHT INGUINAL LYMPH NODE;  Surgeon: Greer Pickerel, MD;  Location: [place];  Service: General;  Laterality: Right;   LAPAROSCOPIC APPENDECTOMY N/A 11/12/2015   Procedure: APPENDECTOMY LAPAROSCOPIC;  Surgeon: Greer Pickerel, MD;  Location: [place];  Service: General;  Laterality: N/A;   SUTURE REMOVAL Left ~ 2016-2017 X 3   "had to use permanent sutures w/my achilles OR; my body rejects them at times & I have to have them surgically removed; under anesthesia"   [place]   Current Outpatient Medications on  File Prior to Visit  Medication Sig Dispense Refill   acetaminophen (TYLENOL) 325  MG tablet Take 650 mg by mouth every 6 (six) hours as needed for moderate pain or headache.     atorvastatin (LIPITOR) 10 MG tablet Take 10 mg by mouth daily.     cetirizine (ZYRTEC) 10 MG tablet Take 10 mg by mouth daily as needed for allergies.     ibuprofen (ADVIL) 400 MG tablet Take 400 mg by mouth once.     multivitamin (ONE-A-DAY MEN'S) TABS tablet Take 1 tablet by mouth daily.     No current facility-administered medications on file prior to visit.   Dailey denied a history of head injuries and loss of consciousness.   Mental Health History: Glenwood denied a history of therapeutic services. Harout denied a history of hospitalizations for psychiatric concerns, and has never met with a psychiatrist. Jalani denied ever being prescribed psychotropic medications. Zakkary denied a family history of mental health related concerns. Jayjay denied a trauma history, including psychological, physical  and sexual abuse, as well as neglect.   Daulton described his typical mood as "pretty optimistic," but "more serious now" due to current events. Aside from concerns noted above and endorsed on the PHQ-9 and GAD-7, Mayan reported experiencing decreased self-esteem due to weight and worry about work and the pandemic. Andriel endorsed "very sporadic" alcohol use. He denied current tobacco use. He denied illicit substance use. Regarding caffeine intake, Jacquis reported drinking 24oz of coffee daily. Furthermore, Randall Hiss denied experiencing the following: hopelessness, hallucinations and delusions, paranoia, mania and decreased motivation. He also denied history of and current suicidal ideation, plan, and intent; history of and current homicidal ideation, plan, and intent; and history of and current engagement in self-harm.  The following strengths were reported by Randall Hiss: optimistic, will power, and devoted to family. The following strengths were observed  by this provider: ability to express thoughts and feelings during the therapeutic session, ability to establish and benefit from a therapeutic relationship, ability to learn and practice coping skills, willingness to work toward established goal(s) with the clinic and ability to engage in reciprocal conversation.  Legal History: Devell denied a history of legal involvement.   Structured Assessment Results: The Patient Health Questionnaire-9 (PHQ-9) is a self-report measure that assesses symptoms and severity of depression over the course of the last two weeks. Kendryck obtained a score of 2 suggesting minimal depression. Dewain finds the endorsed symptoms to be not difficult at all. Little interest or pleasure in doing things 0  Feeling down, depressed, or hopeless 0  Trouble falling or staying asleep, or sleeping too much 0  Feeling tired or having little energy 1  Poor appetite or overeating 0  Feeling bad about yourself --- or that you are a failure or have let yourself or your family down 1  Trouble concentrating on things, such as reading the newspaper or watching television 0  Moving or speaking so slowly that other people could have noticed? Or the opposite --- being so fidgety or restless that you have been moving around a lot more than usual 0  Thoughts that you would be better off dead or hurting yourself in some way 0  PHQ-9 Score 2    The Generalized Anxiety Disorder-7 (GAD-7) is a brief self-report measure that assesses symptoms of anxiety over the course of the last two weeks. Jessup obtained a score of 7 suggesting mild anxiety. Sparrow finds the endorsed symptoms to be not difficult at all. Feeling nervous, anxious, on edge 1  Not being able to stop or control worrying 0  Worrying too much about different things 0  Trouble relaxing 2  Being so restless that it's hard to sit still 1  Becoming easily annoyed or irritable 2  Feeling afraid as if something awful might happen 1  GAD-7 Score 7     Interventions: A chart review was conducted prior to the clinical intake interview. The PHQ-9, and GAD-7 were verbally administered as well as a Mood and Food questionnaire to assess various behaviors related to emotional eating. Throughout session, empathic reflections and validation was provided. Continuing treatment with this provider was discussed and a treatment goal was established. Psychoeducation regarding emotional versus physical hunger was provided. Marquez was sent a handout via e-mail to utilize between now and the next appointment to increase awareness of hunger patterns and subsequent eating. Milind provided verbal consent during today's appointment for this provider to send the handout via e-mail.   Provisional DSM-5 Diagnosis: 311 (F32.8) Other Specified Depressive Disorder, Emotional Eating Behaviors  Plan: Jyden appears able and willing to participate as evidenced by collaboration on a treatment goal, engagement in reciprocal conversation, and asking questions as needed for clarification. The next appointment will be scheduled in two weeks, which will be via [employer]. The following treatment goal was established: decrease emotional eating. Once this provider's office resumes in-person appointments and it is deemed appropriate, Sovereign will be notified. For the aforementioned goal, Ociel can benefit from biweekly individual therapy sessions that are brief in duration for approximately four to six sessions. The treatment modality will be individual therapeutic services, including an eclectic therapeutic approach utilizing techniques from Cognitive Behavioral Therapy, Patient Centered Therapy, Dialectical Behavior Therapy, Acceptance and Commitment Therapy, Interpersonal Therapy, and Cognitive Restructuring. Therapeutic approach will include various interventions as appropriate, such as validation, support, mindfulness, thought defusion, reframing, psychoeducation, values assessment, and role  playing. This provider will regularly review the treatment plan and medical chart to keep informed of status changes. Christphor expressed understanding and agreement with the initial treatment plan of care.

## 2018-08-17 NOTE — Telephone Encounter (Signed)
  Office: [phone]  /  Fax: [phone]  Date of Call: August 17, 2018  Time of Call: 5:11pm Duration of Call: 2 minutes Provider: Glennie Isle, PsyD  CONTENT: This provider was informed by her clinic staff that Randall Hiss returned this provider's call. As such, this provider called Jacory back. Kendrix was agreeable to completing authorizations for this provider to speak with his wife who was also identified as his emergency contact on new patient paperwork. The process of completing and returning the paperwork was shared, and Nikhil was receptive to this provider noting dates for disclosure.   PLAN: This provider will e-mail the authorization documents. Ayuub indicated he would complete and return the authorizations.

## 2018-08-18 ENCOUNTER — Telehealth (INDEPENDENT_AMBULATORY_CARE_PROVIDER_SITE_OTHER): Payer: Self-pay | Admitting: Psychology

## 2018-08-18 DIAGNOSIS — F3289 Other specified depressive episodes: Secondary | ICD-10-CM

## 2018-08-18 NOTE — Telephone Encounter (Signed)
  Office: [phone]  /  Fax: [phone]  Date of Call: August 18, 2018  Time of Call: 12:52pm Duration of Call: 5 minutes Provider: Glennie Isle, PsyD  CONTENT: This provider called James Jennings to discuss referral options due to a potential conflict of interest/multiple relationship. The circumstances were explained to James Jennings, and he acknowledged understanding. Options for referrals/finding a new provider were discussed, including James Jennings checking with his insurance company for providers; exploring [email or web address]; this provider sharing a list of providers in the area; or this provider placing a referral for [place]. James Jennings provided verbal consent for this provider to place a referral for [place] to address emotional eating.   PLAN: This provider will enter the referral.

## 2018-08-24 ENCOUNTER — Other Ambulatory Visit: Payer: Self-pay

## 2018-08-24 ENCOUNTER — Ambulatory Visit (INDEPENDENT_AMBULATORY_CARE_PROVIDER_SITE_OTHER): Payer: 59 | Admitting: Family Medicine

## 2018-08-24 ENCOUNTER — Encounter (INDEPENDENT_AMBULATORY_CARE_PROVIDER_SITE_OTHER): Payer: Self-pay | Admitting: Family Medicine

## 2018-08-24 VITALS — BP 121/77 | HR 91 | Temp 98.5°F | Ht 71.0 in | Wt 258.0 lb

## 2018-08-24 DIAGNOSIS — Z6836 Body mass index (BMI) 36.0-36.9, adult: Secondary | ICD-10-CM

## 2018-08-24 DIAGNOSIS — R7303 Prediabetes: Secondary | ICD-10-CM

## 2018-08-24 DIAGNOSIS — Z9189 Other specified personal risk factors, not elsewhere classified: Secondary | ICD-10-CM | POA: Diagnosis not present

## 2018-08-24 DIAGNOSIS — E7849 Other hyperlipidemia: Secondary | ICD-10-CM

## 2018-08-24 MED ORDER — METFORMIN HCL 500 MG PO TABS: 500.0000 mg | ORAL_TABLET | Freq: Every day | ORAL | 0 refills | Status: DC

## 2018-08-24 NOTE — Progress Notes (Signed)
Office: [phone]  /  Fax: [phone]   HPI:   Chief Complaint: OBESITY James Jennings is here to discuss his progress with his obesity treatment plan. He is on the Category 4 plan and is following his eating plan approximately 70 % of the time. He states he is rode his bike for 25 miles, and walked for 40 minutes 3-4 times per week. James Jennings found he was hungry between lunch and dinner. He started snacking between meals this week. He found dinner to be a large portion of meat. His snacks are celery and peanut butter. He is probably not getting all snack calories in.  His weight is 258 lb (117 kg) today and has had a weight loss of 8 pounds over a period of 2 weeks since his last visit. He has lost 8 lbs since starting treatment with [place].  Pre-Diabetes James Jennings has a new diagnosis of pre-diabetes based on his elevated Hgb A1c and was informed this puts him at greater risk of developing diabetes. Last Hgb A1c was of 6.2 and insulin of 24.6. He is not taking metformin currently and continues to work on diet and exercise to decrease risk of diabetes. He denies nausea or hypoglycemia.  At risk for diabetes James Jennings is at higher than average risk for developing diabetes due to his obesity and pre-diabetes. He currently denies polyuria or polydipsia.  Hyperlipidemia James Jennings has hyperlipidemia and has been trying to improve his cholesterol levels with intensive lifestyle modification including a low saturated fat diet, exercise and weight loss. Last LDL was controlled (on statin). He denies any chest pain, claudication or myalgias.  ASSESSMENT AND PLAN:  Prediabetes - Plan: metFORMIN (GLUCOPHAGE) 500 MG tablet  Other hyperlipidemia  At risk for diabetes mellitus  Class 2 severe obesity with serious comorbidity and body mass index (BMI) of 36.0 to 36.9 in adult, unspecified obesity type [facility])  PLAN:  Pre-Diabetes James Jennings will continue to work on weight loss, exercise, and decreasing simple carbohydrates in his  diet to help decrease the risk of diabetes. We dicussed metformin including benefits and risks. He was informed that eating too many simple carbohydrates or too many calories at one sitting increases the likelihood of GI side effects. James Jennings agrees to start metformin 500 mg PO daily #30 with no refills. [name] agrees to follow up with our clinic in 2 weeks as directed to monitor his progress.  Diabetes risk counseling [name] was given extended (30 minutes) diabetes prevention counseling today. He is 48 y.o. male and has risk factors for diabetes including obesity and pre-diabetes. We discussed intensive lifestyle modifications today with an emphasis on weight loss as well as increasing exercise and decreasing simple carbohydrates in his diet.  Hyperlipidemia [name] was informed of the American Heart Association Guidelines emphasizing intensive lifestyle modifications as the first line treatment for hyperlipidemia. We discussed many lifestyle modifications today in depth, and [name] will continue to work on decreasing saturated fats such as fatty red meat, butter and many fried foods. He will also increase vegetables and lean protein in his diet and continue to work on exercise and weight loss efforts. James Jennings agrees to continue taking statin, and he agrees to follow up with our clinic in 2 weeks.  Obesity James Jennings is currently in the action stage of change. As such, his goal is to continue with weight loss efforts He has agreed to follow the Category 4 plan James Jennings has been instructed to work up to a goal of 150 minutes of combined cardio and strengthening  exercise per week for weight loss and overall health benefits. We discussed the following Behavioral Modification Strategies today: increasing lean protein intake, increasing vegetables and work on meal planning and easy cooking plans, keeping healthy foods in the home, better snacking choices, and planning for success   James Jennings has agreed to follow up with our clinic in  2 weeks. He was informed of the importance of frequent follow up visits to maximize his success with intensive lifestyle modifications for his multiple health conditions.  ALLERGIES: Allergies  Allergen Reactions  . Shellfish Allergy Anaphylaxis  . Lactose Intolerance (Gi) Diarrhea  . Mango Flavor Swelling and Other (See Comments)    MANGO(S) LIPS SWELL    MEDICATIONS: Current Outpatient Medications on File Prior to Visit  Medication Sig Dispense Refill  . acetaminophen (TYLENOL) 325 MG tablet Take 650 mg by mouth every 6 (six) hours as needed for moderate pain or headache.    James Jennings atorvastatin (LIPITOR) 10 MG tablet Take 10 mg by mouth daily.    . cetirizine (ZYRTEC) 10 MG tablet Take 10 mg by mouth daily as needed for allergies.    James Jennings ibuprofen (ADVIL) 400 MG tablet Take 400 mg by mouth once.    . multivitamin (ONE-A-DAY MEN'S) TABS tablet Take 1 tablet by mouth daily.     No current facility-administered medications on file prior to visit.     PAST MEDICAL HISTORY: Past Medical History:  Diagnosis Date  . Bilateral swelling of feet   . High cholesterol   . Joint pain   . Lactose intolerance   . Lymphoma, follicular ([place]) dx'd 16/1096  . Multiple food allergies   . PONV (postoperative nausea and vomiting)   . Sleep apnea    "dx'd ~ 2008; never RX'd mask" (02/28/2016)    PAST SURGICAL HISTORY: Past Surgical History:  Procedure Laterality Date  . ACHILLES TENDON SURGERY Left ~ 2012  . APPENDECTOMY  11/12/2015   lap appy  . CLUB FOOT RELEASE Bilateral 1971  . HERNIA REPAIR    . INGUINAL LYMPH NODE BIOPSY Right 01/20/2016   Procedure: EXCISIONAL BIOPSY OF RIGHT INGUINAL LYMPH NODE;  Surgeon: Greer Pickerel, MD;  Location: [facility];  Service: General;  Laterality: Right;  . LAPAROSCOPIC APPENDECTOMY N/A 11/12/2015   Procedure: APPENDECTOMY LAPAROSCOPIC;  Surgeon: Greer Pickerel, MD;  Location: [facility];  Service: General;  Laterality: N/A;  . SUTURE REMOVAL Left ~ 2016-2017 X 3   "had  to use permanent sutures w/my achilles OR; my body rejects them at times & I have to have them surgically removed; under anesthesia"  . VENTRAL HERNIA REPAIR  1994    SOCIAL HISTORY: Social History   Tobacco Use  . Smoking status: Current Every Day Smoker    Packs/day: 1.00    Years: 30.00    Pack years: 30.00    Types: Cigarettes  . Smokeless tobacco: Former Systems developer    Types: Snuff    Quit date: 76  . Tobacco comment: 11/12/2015 "quit using chew in ~ 1997"  Substance Use Topics  . Alcohol use: Yes    Alcohol/week: 4.0 standard drinks    Types: 4 Shots of liquor per week  . Drug use: Yes    Types: Marijuana    Comment: "in the early 1990s; recreational"    FAMILY HISTORY: History reviewed. No pertinent family history.  ROS: Review of Systems  Constitutional: Positive for weight loss.  Cardiovascular: Negative for chest pain and claudication.  Gastrointestinal: Negative for nausea.  Genitourinary: Negative  for frequency.  Musculoskeletal: Negative for myalgias.  Endo/Heme/Allergies: Negative for polydipsia.       Negative hypoglycemia    PHYSICAL EXAM: Blood pressure 121/77, pulse 91, temperature 98.5 F (36.9 C), temperature source Oral, height 5\' 11"  (1.803 m), weight 258 lb (117 kg), SpO2 95 %. Body mass index is 35.98 kg/m. Physical Exam Vitals signs reviewed.  Constitutional:      Appearance: Normal appearance. He is obese.  Cardiovascular:     Rate and Rhythm: Normal rate.     Pulses: Normal pulses.  Pulmonary:     Effort: Pulmonary effort is normal.     Breath sounds: Normal breath sounds.  Musculoskeletal: Normal range of motion.  Skin:    General: Skin is warm and dry.  Neurological:     Mental Status: He is alert and oriented to person, place, and time.  Psychiatric:        Mood and Affect: Mood normal.        Behavior: Behavior normal.     RECENT LABS AND TESTS: BMET    Component Value Date/Time   NA 143 08/10/2018 1604   NA 141  12/03/2016 1155   NA 141 02/07/2016 1149   K 4.3 08/10/2018 1604   K 3.8 12/03/2016 1155   K 4.3 02/07/2016 1149   CL 103 08/10/2018 1604   CL 100 12/03/2016 1155   CO2 23 08/10/2018 1604   CO2 27 12/03/2016 1155   CO2 27 02/07/2016 1149   GLUCOSE 109 (H) 08/10/2018 1604   GLUCOSE 132 (H) 07/25/2018 0750   GLUCOSE 128 (H) 12/03/2016 1155   BUN 12 08/10/2018 1604   BUN 10 12/03/2016 1155   BUN 10.9 02/07/2016 1149   CREATININE 0.80 08/10/2018 1604   CREATININE 1.17 07/25/2018 0750   CREATININE 1.0 12/03/2016 1155   CREATININE 0.9 02/07/2016 1149   CALCIUM 8.9 08/10/2018 1604   CALCIUM 9.1 12/03/2016 1155   CALCIUM 9.6 02/07/2016 1149   GFRNONAA 105 08/10/2018 1604   GFRNONAA >60 07/25/2018 0750   GFRAA 121 08/10/2018 1604   GFRAA >60 07/25/2018 0750   Lab Results  Component Value Date   HGBA1C 6.2 (H) 08/10/2018   Lab Results  Component Value Date   INSULIN 24.6 08/10/2018   CBC    Component Value Date/Time   WBC 4.9 08/10/2018 1604   WBC 4.7 07/25/2018 0750   WBC 4.1 02/29/2016 0720   RBC 4.50 08/10/2018 1604   RBC 4.29 07/25/2018 0750   HGB 14.0 08/10/2018 1604   HGB 14.4 12/03/2016 1155   HGB 14.2 12/06/2015 1606   HCT 41.3 08/10/2018 1604   HCT 41.1 12/03/2016 1155   HCT 40.9 12/06/2015 1606   PLT 197 07/25/2018 0750   PLT 227 12/03/2016 1155   PLT 257 12/06/2015 1606   MCV 92 08/10/2018 1604   MCV 92 12/03/2016 1155   MCV 89.7 12/06/2015 1606   MCH 31.1 08/10/2018 1604   MCH 31.0 07/25/2018 0750   MCHC 33.9 08/10/2018 1604   MCHC 33.6 07/25/2018 0750   RDW 12.8 08/10/2018 1604   RDW 12.9 12/03/2016 1155   RDW 13.0 12/06/2015 1606   LYMPHSABS 1.5 08/10/2018 1604   LYMPHSABS 1.1 12/03/2016 1155   LYMPHSABS 3.1 12/06/2015 1606   MONOABS 0.5 07/25/2018 0750   MONOABS 0.7 12/06/2015 1606   EOSABS 0.2 08/10/2018 1604   EOSABS 0.3 12/03/2016 1155   BASOSABS 0.0 08/10/2018 1604   BASOSABS 0.0 12/03/2016 1155   BASOSABS 0.0 12/06/2015 1606  Iron/TIBC/Ferritin/ %Sat No results found for: IRON, TIBC, FERRITIN, IRONPCTSAT Lipid Panel     Component Value Date/Time   CHOL 170 08/10/2018 1604   TRIG 134 08/10/2018 1604   HDL 46 08/10/2018 1604   LDLCALC 97 08/10/2018 1604   Hepatic Function Panel     Component Value Date/Time   PROT 6.5 08/10/2018 1604   PROT 7.1 12/03/2016 1155   PROT 7.4 02/07/2016 1149   ALBUMIN 4.4 08/10/2018 1604   ALBUMIN 3.9 02/07/2016 1149   AST 20 08/10/2018 1604   AST 18 07/25/2018 0750   AST 17 02/07/2016 1149   ALT 20 08/10/2018 1604   ALT 18 07/25/2018 0750   ALT 24 12/03/2016 1155   ALT 16 02/07/2016 1149   ALKPHOS 108 08/10/2018 1604   ALKPHOS 99 (H) 12/03/2016 1155   ALKPHOS 127 02/07/2016 1149   BILITOT 0.4 08/10/2018 1604   BILITOT 0.5 07/25/2018 0750   BILITOT 0.42 02/07/2016 1149      Component Value Date/Time   TSH 2.400 08/10/2018 1604      OBESITY BEHAVIORAL INTERVENTION VISIT  Today's visit was # 2   Starting weight: 266 lbs Starting date: 08/10/2018 Today's weight : 258 lbs  Today's date: 08/24/2018 Total lbs lost to date: 8 At least 15 minutes were spent on discussing the following behavioral intervention visit.   ASK: We discussed the diagnosis of obesity with Howie Ill today and Kalim agreed to give [place] permission to discuss obesity behavioral modification therapy today.  ASSESS: Radin has the diagnosis of obesity and his BMI today is 76 Naser is in the action stage of change   ADVISE: Kymani was educated on the multiple health risks of obesity as well as the benefit of weight loss to improve his health. He was advised of the need for long term treatment and the importance of lifestyle modifications to improve his current health and to decrease his risk of future health problems.  AGREE: Multiple dietary modification options and treatment options were discussed and  Josiyah agreed to follow the recommendations documented in the above note.  ARRANGE: Mikal was  educated on the importance of frequent visits to treat obesity as outlined per CMS and USPSTF guidelines and agreed to schedule his next follow up appointment today.  I, Trixie Dredge, am acting as transcriptionist for Ilene Qua, MD  I have reviewed the above documentation for accuracy and completeness, and I agree with the above. - Ilene Qua, MD

## 2018-09-01 ENCOUNTER — Ambulatory Visit (INDEPENDENT_AMBULATORY_CARE_PROVIDER_SITE_OTHER): Payer: 59 | Admitting: Psychology

## 2018-09-01 DIAGNOSIS — F509 Eating disorder, unspecified: Secondary | ICD-10-CM

## 2018-09-01 DIAGNOSIS — E669 Obesity, unspecified: Secondary | ICD-10-CM | POA: Diagnosis not present

## 2018-09-13 ENCOUNTER — Other Ambulatory Visit: Payer: Self-pay

## 2018-09-13 ENCOUNTER — Ambulatory Visit (INDEPENDENT_AMBULATORY_CARE_PROVIDER_SITE_OTHER): Payer: 59 | Admitting: Family Medicine

## 2018-09-13 ENCOUNTER — Encounter (INDEPENDENT_AMBULATORY_CARE_PROVIDER_SITE_OTHER): Payer: Self-pay | Admitting: Family Medicine

## 2018-09-13 VITALS — BP 116/74 | HR 79 | Temp 97.9°F | Ht 71.0 in | Wt 256.0 lb

## 2018-09-13 DIAGNOSIS — E7849 Other hyperlipidemia: Secondary | ICD-10-CM

## 2018-09-13 DIAGNOSIS — R7303 Prediabetes: Secondary | ICD-10-CM | POA: Diagnosis not present

## 2018-09-13 DIAGNOSIS — Z6835 Body mass index (BMI) 35.0-35.9, adult: Secondary | ICD-10-CM

## 2018-09-14 NOTE — Progress Notes (Signed)
Office: [phone]  /  Fax: [phone]   HPI:   Chief Complaint: OBESITY James Jennings is here to discuss his progress with his obesity treatment plan. He is on the Category 4 plan and is following his eating plan approximately 95 to 96 % of the time. He states he is walking 2 miles and biking 40 minutes 5 times per week. James Jennings is feeling well and he feels following the plan is getting easier because it's more routine. He is occasionally branching out but patient voices he eats the same everyday. James Jennings has no consistent hunger, even with increased activity. His weight is 256 lb (116.1 kg) today and has had a weight loss of 2 pounds over a period of 3 weeks since his last visit. He has lost 10 lbs since starting treatment with [place].  Pre-Diabetes James Jennings has a diagnosis of prediabetes based on his elevated Hgb A1c and was informed this puts him at greater risk of developing diabetes. James Jennings has occasional indulgent eating, mostly with carbohydrates such as brown rice or ice cream. James Jennings is taking metformin currently and he continues to work on diet and exercise to decrease risk of diabetes. He denies nausea or hypoglycemia.  Hyperlipidemia James Jennings has hyperlipidemia and he has LDL of 97 and HDL of 46. James Jennings has been trying to improve his cholesterol levels with intensive lifestyle modification including a low saturated fat diet, exercise and weight loss. He denies any chest pain or myalgias.  ASSESSMENT AND PLAN:  Prediabetes  Other hyperlipidemia  Class 2 severe obesity with serious comorbidity and body mass index (BMI) of 35.0 to 35.9 in adult, unspecified obesity type James Jennings)  PLAN:  Pre-Diabetes James Jennings will continue to work on weight loss, exercise, and decreasing simple carbohydrates in his diet to help decrease the risk of diabetes. We dicussed metformin including benefits and risks. He was informed that eating too many simple carbohydrates or too many calories at one sitting increases the likelihood of  GI side effects. James Jennings will continue metformin for now and a prescription was not written today. James Jennings agreed to follow up with [place] as directed to monitor his progress.  Hyperlipidemia James Jennings was informed of the American Heart Association Guidelines emphasizing intensive lifestyle modifications as the first line treatment for hyperlipidemia. We discussed many lifestyle modifications today in depth, and James Jennings will continue to work on decreasing saturated fats such as fatty red meat, butter and many fried foods. He will also increase vegetables and lean protein in his diet and continue to work on exercise and weight loss efforts. We will repeat labs at the end of October and James Jennings will follow up as directed.  I spent > than 50% of the 15 minute visit on counseling as documented in the note.  Obesity James Jennings is currently in the action stage of change. As such, his goal is to continue with weight loss efforts He has agreed to follow the Category 4 plan James Jennings has been instructed to work up to a goal of 150 minutes of combined cardio and strengthening exercise per week for weight loss and overall health benefits. We discussed the following Behavioral Modification Strategies today: planning for success, keeping healthy foods in the home, increasing lean protein intake, increasing vegetables and work on meal planning and easy cooking plans  James Jennings has agreed to follow up with our clinic in 2 weeks. He was informed of the importance of frequent follow up visits to maximize his success with intensive lifestyle modifications for his multiple health conditions.  ALLERGIES: Allergies  Allergen Reactions  . Shellfish Allergy Anaphylaxis  . Lactose Intolerance (Gi) Diarrhea  . Mango Flavor Swelling and Other (See Comments)    MANGO(S) LIPS SWELL    MEDICATIONS: Current Outpatient Medications on File Prior to Visit  Medication Sig Dispense Refill  . acetaminophen (TYLENOL) 325 MG tablet Take 650 mg by mouth every 6  (six) hours as needed for moderate pain or headache.    [name] atorvastatin (LIPITOR) 10 MG tablet Take 10 mg by mouth daily.    . cetirizine (ZYRTEC) 10 MG tablet Take 10 mg by mouth daily as needed for allergies.    [name] ibuprofen (ADVIL) 400 MG tablet Take 400 mg by mouth once.    . metFORMIN (GLUCOPHAGE) 500 MG tablet Take 1 tablet (500 mg total) by mouth daily with breakfast. 30 tablet 0  . multivitamin (ONE-A-DAY MEN'S) TABS tablet Take 1 tablet by mouth daily.     No current facility-administered medications on file prior to visit.     PAST MEDICAL HISTORY: Past Medical History:  Diagnosis Date  . Bilateral swelling of feet   . High cholesterol   . Joint pain   . Lactose intolerance   . Lymphoma, follicular ([place]) dx'd 21/2248  . Multiple food allergies   . PONV (postoperative nausea and vomiting)   . Sleep apnea    "dx'd ~ 2008; never RX'd mask" (02/28/2016)    PAST SURGICAL HISTORY: Past Surgical History:  Procedure Laterality Date  . ACHILLES TENDON SURGERY Left ~ 2012  . APPENDECTOMY  11/12/2015   lap appy  . CLUB FOOT RELEASE Bilateral 1971  . HERNIA REPAIR    . INGUINAL LYMPH NODE BIOPSY Right 01/20/2016   Procedure: EXCISIONAL BIOPSY OF RIGHT INGUINAL LYMPH NODE;  Surgeon: Greer Pickerel, MD;  Location: [facility];  Service: General;  Laterality: Right;  . LAPAROSCOPIC APPENDECTOMY N/A 11/12/2015   Procedure: APPENDECTOMY LAPAROSCOPIC;  Surgeon: Greer Pickerel, MD;  Location: [facility];  Service: General;  Laterality: N/A;  . SUTURE REMOVAL Left ~ 2016-2017 X 3   "had to use permanent sutures w/my achilles OR; my body rejects them at times & I have to have them surgically removed; under anesthesia"  . VENTRAL HERNIA REPAIR  1994    SOCIAL HISTORY: Social History   Tobacco Use  . Smoking status: Current Every Day Smoker    Packs/day: 1.00    Years: 30.00    Pack years: 30.00    Types: Cigarettes  . Smokeless tobacco: Former Systems developer    Types: Snuff    Quit date: 62  . Tobacco  comment: 11/12/2015 "quit using chew in ~ 1997"  Substance Use Topics  . Alcohol use: Yes    Alcohol/week: 4.0 standard drinks    Types: 4 Shots of liquor per week  . Drug use: Yes    Types: Marijuana    Comment: "in the early 1990s; recreational"    FAMILY HISTORY: History reviewed. No pertinent family history.  ROS: Review of Systems  Constitutional: Positive for weight loss.  Cardiovascular: Negative for chest pain.  Gastrointestinal: Negative for nausea.  Musculoskeletal: Negative for myalgias.  Endo/Heme/Allergies:       Negative for hypoglycemia    PHYSICAL EXAM: Blood pressure 116/74, pulse 79, temperature 97.9 F (36.6 C), temperature source Oral, height 5\' 11"  (1.803 m), weight 256 lb (116.1 kg), SpO2 95 %. Body mass index is 35.7 kg/m. Physical Exam Vitals signs reviewed.  Constitutional:      Appearance: Normal appearance. He  is well-developed. He is obese.  Cardiovascular:     Rate and Rhythm: Normal rate.  Pulmonary:     Effort: Pulmonary effort is normal.  Musculoskeletal: Normal range of motion.  Skin:    General: Skin is warm and dry.  Neurological:     Mental Status: He is alert and oriented to person, place, and time.  Psychiatric:        Mood and Affect: Mood normal.        Behavior: Behavior normal.     RECENT LABS AND TESTS: BMET    Component Value Date/Time   NA 143 08/10/2018 1604   NA 141 12/03/2016 1155   NA 141 02/07/2016 1149   K 4.3 08/10/2018 1604   K 3.8 12/03/2016 1155   K 4.3 02/07/2016 1149   CL 103 08/10/2018 1604   CL 100 12/03/2016 1155   CO2 23 08/10/2018 1604   CO2 27 12/03/2016 1155   CO2 27 02/07/2016 1149   GLUCOSE 109 (H) 08/10/2018 1604   GLUCOSE 132 (H) 07/25/2018 0750   GLUCOSE 128 (H) 12/03/2016 1155   BUN 12 08/10/2018 1604   BUN 10 12/03/2016 1155   BUN 10.9 02/07/2016 1149   CREATININE 0.80 08/10/2018 1604   CREATININE 1.17 07/25/2018 0750   CREATININE 1.0 12/03/2016 1155   CREATININE 0.9  02/07/2016 1149   CALCIUM 8.9 08/10/2018 1604   CALCIUM 9.1 12/03/2016 1155   CALCIUM 9.6 02/07/2016 1149   GFRNONAA 105 08/10/2018 1604   GFRNONAA >60 07/25/2018 0750   GFRAA 121 08/10/2018 1604   GFRAA >60 07/25/2018 0750   Lab Results  Component Value Date   HGBA1C 6.2 (H) 08/10/2018   Lab Results  Component Value Date   INSULIN 24.6 08/10/2018   CBC    Component Value Date/Time   WBC 4.9 08/10/2018 1604   WBC 4.7 07/25/2018 0750   WBC 4.1 02/29/2016 0720   RBC 4.50 08/10/2018 1604   RBC 4.29 07/25/2018 0750   HGB 14.0 08/10/2018 1604   HGB 14.4 12/03/2016 1155   HGB 14.2 12/06/2015 1606   HCT 41.3 08/10/2018 1604   HCT 41.1 12/03/2016 1155   HCT 40.9 12/06/2015 1606   PLT 197 07/25/2018 0750   PLT 227 12/03/2016 1155   PLT 257 12/06/2015 1606   MCV 92 08/10/2018 1604   MCV 92 12/03/2016 1155   MCV 89.7 12/06/2015 1606   MCH 31.1 08/10/2018 1604   MCH 31.0 07/25/2018 0750   MCHC 33.9 08/10/2018 1604   MCHC 33.6 07/25/2018 0750   RDW 12.8 08/10/2018 1604   RDW 12.9 12/03/2016 1155   RDW 13.0 12/06/2015 1606   LYMPHSABS 1.5 08/10/2018 1604   LYMPHSABS 1.1 12/03/2016 1155   LYMPHSABS 3.1 12/06/2015 1606   MONOABS 0.5 07/25/2018 0750   MONOABS 0.7 12/06/2015 1606   EOSABS 0.2 08/10/2018 1604   EOSABS 0.3 12/03/2016 1155   BASOSABS 0.0 08/10/2018 1604   BASOSABS 0.0 12/03/2016 1155   BASOSABS 0.0 12/06/2015 1606   Iron/TIBC/Ferritin/ %Sat No results found for: IRON, TIBC, FERRITIN, IRONPCTSAT Lipid Panel     Component Value Date/Time   CHOL 170 08/10/2018 1604   TRIG 134 08/10/2018 1604   HDL 46 08/10/2018 1604   LDLCALC 97 08/10/2018 1604   Hepatic Function Panel     Component Value Date/Time   PROT 6.5 08/10/2018 1604   PROT 7.1 12/03/2016 1155   PROT 7.4 02/07/2016 1149   ALBUMIN 4.4 08/10/2018 1604   ALBUMIN 3.9 02/07/2016 1149  AST 20 08/10/2018 1604   AST 18 07/25/2018 0750   AST 17 02/07/2016 1149   ALT 20 08/10/2018 1604   ALT 18  07/25/2018 0750   ALT 24 12/03/2016 1155   ALT 16 02/07/2016 1149   ALKPHOS 108 08/10/2018 1604   ALKPHOS 99 (H) 12/03/2016 1155   ALKPHOS 127 02/07/2016 1149   BILITOT 0.4 08/10/2018 1604   BILITOT 0.5 07/25/2018 0750   BILITOT 0.42 02/07/2016 1149      Component Value Date/Time   TSH 2.400 08/10/2018 1604     Ref. Range 08/10/2018 16:04  Vitamin D, 25-Hydroxy Latest Ref Range: 30.0 - 100.0 ng/mL 51.2    OBESITY BEHAVIORAL INTERVENTION VISIT  Today's visit was # 3   Starting weight: 266 lbs Starting date: 08/10/18 Today's weight : 256 lbs Today's date: 09/13/2018 Total lbs lost to date: 10    09/13/2018  Height 5\' 11"  (1.803 m)  Weight 256 lb (116.1 kg)  BMI (Calculated) 35.72  BLOOD PRESSURE - SYSTOLIC 277  BLOOD PRESSURE - DIASTOLIC 74   Body Fat % 82.4 %  Total Body Water (lbs) 114 lbs    ASK: We discussed the diagnosis of obesity with James Jennings today and James Jennings agreed to give [place] permission to discuss obesity behavioral modification therapy today.  ASSESS: James Jennings has the diagnosis of obesity and his BMI today is 35.72 James Jennings is in the action stage of change   ADVISE: James Jennings was educated on the multiple health risks of obesity as well as the benefit of weight loss to improve his health. He was advised of the need for long term treatment and the importance of lifestyle modifications to improve his current health and to decrease his risk of future health problems.  AGREE: Multiple dietary modification options and treatment options were discussed and  James Jennings agreed to follow the recommendations documented in the above note.  ARRANGE: James Jennings was educated on the importance of frequent visits to treat obesity as outlined per CMS and USPSTF guidelines and agreed to schedule his next follow up appointment today.  I, Doreene Nest, am acting as transcriptionist for [name] Jones, MD  I have reviewed the above documentation for accuracy and completeness, and I agree with the  above. - Ilene Qua, MD

## 2018-09-16 ENCOUNTER — Ambulatory Visit: Payer: 59 | Admitting: Psychology

## 2018-09-20 ENCOUNTER — Ambulatory Visit (INDEPENDENT_AMBULATORY_CARE_PROVIDER_SITE_OTHER): Payer: 59 | Admitting: Psychology

## 2018-09-20 DIAGNOSIS — F509 Eating disorder, unspecified: Secondary | ICD-10-CM

## 2018-09-22 ENCOUNTER — Other Ambulatory Visit: Payer: Self-pay

## 2018-09-22 DIAGNOSIS — Z20822 Contact with and (suspected) exposure to covid-19: Secondary | ICD-10-CM

## 2018-09-23 LAB — NOVEL CORONAVIRUS, NAA: SARS-CoV-2, NAA: NOT DETECTED

## 2018-09-27 ENCOUNTER — Ambulatory Visit (INDEPENDENT_AMBULATORY_CARE_PROVIDER_SITE_OTHER): Payer: 59 | Admitting: Family Medicine

## 2018-09-27 ENCOUNTER — Encounter (INDEPENDENT_AMBULATORY_CARE_PROVIDER_SITE_OTHER): Payer: Self-pay | Admitting: Family Medicine

## 2018-09-27 ENCOUNTER — Other Ambulatory Visit: Payer: Self-pay

## 2018-09-27 DIAGNOSIS — R7303 Prediabetes: Secondary | ICD-10-CM

## 2018-09-27 DIAGNOSIS — E7849 Other hyperlipidemia: Secondary | ICD-10-CM | POA: Diagnosis not present

## 2018-09-27 DIAGNOSIS — Z6835 Body mass index (BMI) 35.0-35.9, adult: Secondary | ICD-10-CM

## 2018-09-27 MED ORDER — METFORMIN HCL 500 MG PO TABS: 500.0000 mg | ORAL_TABLET | Freq: Every day | ORAL | 0 refills | Status: DC

## 2018-09-29 NOTE — Progress Notes (Signed)
Office: [phone]  /  Fax: [phone] TeleHealth Visit:  James Jennings has verbally consented to this TeleHealth visit today. The patient is located at home, the provider is located at the [employer] [facility] office. The participants in this visit include the listed provider and patient and any and all parties involved. The visit was conducted today via WebEx.  HPI:   Chief Complaint: OBESITY James Jennings is here to discuss his progress with his obesity treatment plan. He is on the Category 4 plan and is following his eating plan approximately 90 to 95 % of the time. He states he is walking 2 miles 5 times per week and biking 30 miles 1 time per week. James Jennings has a weight of 255.6 today (09/27/18). He is getting over a sinus infection. James Jennings is planning to indulge in cake tonight for his son's birthday. He does mention that this weekend he will have visitors. We were unable to weigh the patient today for this TeleHealth visit. He feels as if he has lost weight since his last visit. He has lost 10 lbs since starting treatment with [place].  Pre-Diabetes James Jennings has a diagnosis of prediabetes based on his elevated Hgb A1c and was informed this puts him at greater risk of developing diabetes. Corinthian is on metformin and he denies any GI side effects. His last A1c was at 6.2 James Jennings continues to work on diet and exercise to decrease risk of diabetes. He denies nausea or hypoglycemia.  Vitamin D deficiency James Jennings has a diagnosis of vitamin D deficiency. He is currently taking a multi-vitamin. James Jennings admits fatigue and denies nausea, vomiting or muscle weakness.  ASSESSMENT AND PLAN:  Other hyperlipidemia  Prediabetes - Plan: metFORMIN (GLUCOPHAGE) 500 MG tablet  Class 2 severe obesity with serious comorbidity and body mass index (BMI) of 35.0 to 35.9 in adult, unspecified obesity type [facility])  PLAN:  Pre-Diabetes [name] will continue to work on weight loss, exercise, and decreasing simple carbohydrates in his diet  to help decrease the risk of diabetes. We dicussed metformin including benefits and risks. He was informed that eating too many simple carbohydrates or too many calories at one sitting increases the likelihood of GI side effects. Tobe agrees to continue metformin 500 mg daily #30 with no refills and follow up with [place] as directed to monitor his progress.  Vitamin D Deficiency [name] was informed that low vitamin D levels contributes to fatigue and are associated with obesity, breast, and colon cancer. [name] will continue taking multi-vitamin and he will follow up for routine testing of vitamin D, at least 2-3 times per year. He was informed of the risk of over-replacement of vitamin D and agrees to not increase his dose unless he discusses this with [place] first.  Obesity James Jennings is currently in the action stage of change. As such, his goal is to continue with weight loss efforts He has agreed to keep a food journal with 550 to 700 calories and 50+ grams of protein at supper daily and follow the Category 3 plan James Jennings has been instructed to work up to a goal of 150 minutes of combined cardio and strengthening exercise per week for weight loss and overall health benefits. We discussed the following Behavioral Modification Strategies today: planning for success, keeping healthy foods in the home, better snacking choices, increasing lean protein intake, increasing vegetables and work on meal planning and easy cooking plans  James Jennings has agreed to follow up with our clinic in 4 weeks. He was informed  of the importance of frequent follow up visits to maximize his success with intensive lifestyle modifications for his multiple health conditions.  ALLERGIES: Allergies  Allergen Reactions   Shellfish Allergy Anaphylaxis   Lactose Intolerance (Gi) Diarrhea   Mango Flavor Swelling and Other (See Comments)    MANGO(S) LIPS SWELL    MEDICATIONS: Current Outpatient Medications on File Prior to Visit  Medication Sig  Dispense Refill   acetaminophen (TYLENOL) 325 MG tablet Take 650 mg by mouth every 6 (six) hours as needed for moderate pain or headache.     atorvastatin (LIPITOR) 10 MG tablet Take 10 mg by mouth daily.     cetirizine (ZYRTEC) 10 MG tablet Take 10 mg by mouth daily as needed for allergies.     ibuprofen (ADVIL) 400 MG tablet Take 400 mg by mouth once.     multivitamin (ONE-A-DAY MEN'S) TABS tablet Take 1 tablet by mouth daily.     No current facility-administered medications on file prior to visit.     PAST MEDICAL HISTORY: Past Medical History:  Diagnosis Date   Bilateral swelling of feet    High cholesterol    Joint pain    Lactose intolerance    Lymphoma, follicular ([place]) dx'd [id number]   Multiple food allergies    PONV (postoperative nausea and vomiting)    Sleep apnea    "dx'd ~ 2008; never [id number] mask" (02/28/2016)    PAST SURGICAL HISTORY: Past Surgical History:  Procedure Laterality Date   ACHILLES TENDON SURGERY Left ~ 2012   APPENDECTOMY  11/12/2015   lap appy   CLUB FOOT RELEASE Bilateral 1971   HERNIA REPAIR     INGUINAL LYMPH NODE BIOPSY Right 01/20/2016   Procedure: EXCISIONAL BIOPSY OF RIGHT INGUINAL LYMPH NODE;  Surgeon: Greer Pickerel, MD;  Location: [facility];  Service: General;  Laterality: Right;   LAPAROSCOPIC APPENDECTOMY N/A 11/12/2015   Procedure: APPENDECTOMY LAPAROSCOPIC;  Surgeon: Greer Pickerel, MD;  Location: [facility];  Service: General;  Laterality: N/A;   SUTURE REMOVAL Left ~ 2016-2017 X 3   "had to use permanent sutures w/my achilles OR; my body rejects them at times & I have to have them surgically removed; under anesthesia"   [place]: Social History   Tobacco Use   Smoking status: Current Every Day Smoker    Packs/day: 1.00    Years: 30.00    Pack years: 30.00    Types: Cigarettes   Smokeless tobacco: Former Systems developer    Types: Snuff    Quit date: 1997   Tobacco comment: 11/12/2015 "quit  using chew in ~ 1997"  Substance Use Topics   Alcohol use: Yes    Alcohol/week: 4.0 standard drinks    Types: 4 Shots of liquor per week   Drug use: Yes    Types: Marijuana    Comment: "in the early 1990s; recreational"    FAMILY HISTORY: History reviewed. No pertinent family history.  ROS: Review of Systems  Constitutional: Positive for malaise/fatigue and weight loss.  Gastrointestinal: Negative for nausea and vomiting.  Musculoskeletal:       Negative for muscle weakness  Endo/Heme/Allergies:       Negative for hypoglycemia    PHYSICAL EXAM: Pt in no acute distress  RECENT LABS AND TESTS: BMET    Component Value Date/Time   NA 143 08/10/2018 1604   NA 141 12/03/2016 1155   NA 141 02/07/2016 1149   K 4.3 08/10/2018 1604  K 3.8 12/03/2016 1155   K 4.3 02/07/2016 1149   CL 103 08/10/2018 1604   CL 100 12/03/2016 1155   CO2 23 08/10/2018 1604   CO2 27 12/03/2016 1155   CO2 27 02/07/2016 1149   GLUCOSE 109 (H) 08/10/2018 1604   GLUCOSE 132 (H) 07/25/2018 0750   GLUCOSE 128 (H) 12/03/2016 1155   BUN 12 08/10/2018 1604   BUN 10 12/03/2016 1155   BUN 10.9 02/07/2016 1149   CREATININE 0.80 08/10/2018 1604   CREATININE 1.17 07/25/2018 0750   CREATININE 1.0 12/03/2016 1155   CREATININE 0.9 02/07/2016 1149   CALCIUM 8.9 08/10/2018 1604   CALCIUM 9.1 12/03/2016 1155   CALCIUM 9.6 02/07/2016 1149   GFRNONAA 105 08/10/2018 1604   GFRNONAA >60 07/25/2018 0750   GFRAA 121 08/10/2018 1604   GFRAA >60 07/25/2018 0750   Lab Results  Component Value Date   HGBA1C 6.2 (H) 08/10/2018   Lab Results  Component Value Date   INSULIN 24.6 08/10/2018   CBC    Component Value Date/Time   WBC 4.9 08/10/2018 1604   WBC 4.7 07/25/2018 0750   WBC 4.1 02/29/2016 0720   RBC 4.50 08/10/2018 1604   RBC 4.29 07/25/2018 0750   HGB 14.0 08/10/2018 1604   HGB 14.4 12/03/2016 1155   HGB 14.2 12/06/2015 1606   HCT 41.3 08/10/2018 1604   HCT 41.1 12/03/2016 1155   HCT 40.9  12/06/2015 1606   PLT 197 07/25/2018 0750   PLT 227 12/03/2016 1155   PLT 257 12/06/2015 1606   MCV 92 08/10/2018 1604   MCV 92 12/03/2016 1155   MCV 89.7 12/06/2015 1606   MCH 31.1 08/10/2018 1604   MCH 31.0 07/25/2018 0750   MCHC 33.9 08/10/2018 1604   MCHC 33.6 07/25/2018 0750   RDW 12.8 08/10/2018 1604   RDW 12.9 12/03/2016 1155   RDW 13.0 12/06/2015 1606   LYMPHSABS 1.5 08/10/2018 1604   LYMPHSABS 1.1 12/03/2016 1155   LYMPHSABS 3.1 12/06/2015 1606   MONOABS 0.5 07/25/2018 0750   MONOABS 0.7 12/06/2015 1606   EOSABS 0.2 08/10/2018 1604   EOSABS 0.3 12/03/2016 1155   BASOSABS 0.0 08/10/2018 1604   BASOSABS 0.0 12/03/2016 1155   BASOSABS 0.0 12/06/2015 1606   Iron/TIBC/Ferritin/ %Sat No results found for: IRON, TIBC, FERRITIN, IRONPCTSAT Lipid Panel     Component Value Date/Time   CHOL 170 08/10/2018 1604   TRIG 134 08/10/2018 1604   HDL 46 08/10/2018 1604   LDLCALC 97 08/10/2018 1604   Hepatic Function Panel     Component Value Date/Time   PROT 6.5 08/10/2018 1604   PROT 7.1 12/03/2016 1155   PROT 7.4 02/07/2016 1149   ALBUMIN 4.4 08/10/2018 1604   ALBUMIN 3.9 02/07/2016 1149   AST 20 08/10/2018 1604   AST 18 07/25/2018 0750   AST 17 02/07/2016 1149   ALT 20 08/10/2018 1604   ALT 18 07/25/2018 0750   ALT 24 12/03/2016 1155   ALT 16 02/07/2016 1149   ALKPHOS 108 08/10/2018 1604   ALKPHOS 99 (H) 12/03/2016 1155   ALKPHOS 127 02/07/2016 1149   BILITOT 0.4 08/10/2018 1604   BILITOT 0.5 07/25/2018 0750   BILITOT 0.42 02/07/2016 1149      Component Value Date/Time   TSH 2.400 08/10/2018 1604     Ref. Range 08/10/2018 16:04  Vitamin D, 25-Hydroxy Latest Ref Range: 30.0 - 100.0 ng/mL 51.2    I, Doreene Nest, am acting as Location manager for James Jennings Jones,  MD  I have reviewed the above documentation for accuracy and completeness, and I agree with the above. - Ilene Qua, MD

## 2018-09-29 NOTE — Telephone Encounter (Signed)
Please advise 

## 2018-10-25 ENCOUNTER — Other Ambulatory Visit: Payer: Self-pay

## 2018-10-25 ENCOUNTER — Ambulatory Visit (INDEPENDENT_AMBULATORY_CARE_PROVIDER_SITE_OTHER): Payer: 59 | Admitting: Family Medicine

## 2018-10-25 ENCOUNTER — Encounter (INDEPENDENT_AMBULATORY_CARE_PROVIDER_SITE_OTHER): Payer: Self-pay | Admitting: Family Medicine

## 2018-10-25 VITALS — HR 78 | Temp 98.1°F | Ht 71.0 in | Wt 253.0 lb

## 2018-10-25 DIAGNOSIS — R7303 Prediabetes: Secondary | ICD-10-CM | POA: Diagnosis not present

## 2018-10-25 DIAGNOSIS — E7849 Other hyperlipidemia: Secondary | ICD-10-CM | POA: Diagnosis not present

## 2018-10-25 DIAGNOSIS — Z9189 Other specified personal risk factors, not elsewhere classified: Secondary | ICD-10-CM | POA: Diagnosis not present

## 2018-10-25 DIAGNOSIS — Z6835 Body mass index (BMI) 35.0-35.9, adult: Secondary | ICD-10-CM

## 2018-10-25 MED ORDER — METFORMIN HCL 500 MG PO TABS: 500.0000 mg | ORAL_TABLET | Freq: Every day | ORAL | 0 refills | Status: DC

## 2018-10-27 NOTE — Progress Notes (Signed)
Office: [phone]  /  Fax: [phone]   HPI:   Chief Complaint: OBESITY James Jennings is here to discuss his progress with his obesity treatment plan. He is on the keep a food journal with 550-700 calories and 50+ grams of protein at supper daily and follow the Category 3 plan and is following his eating plan approximately 98 % of the time. He states he is walking 2 miles for 50 minutes, and bike riding 41 miles 1 time per week. James Jennings has been busy with work, play with his son, and starting to go into the office. He is planning to go into the office more frequently. He did go on a 41 mile bike ride a few days ago and was starving.  His weight is 253 lb (114.8 kg) today and has had a weight loss of 3 pounds over a period of 4 weeks since his last visit. He has lost 13 lbs since starting treatment with [place].  Pre-Diabetes James Jennings has a diagnosis of pre-diabetes based on his elevated Hgb A1c and was informed this puts him at greater risk of developing diabetes. Last Hgb A1c was of 6.2 and insulin of 24.6. He is taking metformin currently and continues to work on diet and exercise to decrease risk of diabetes. He denies hypoglycemia.  At risk for diabetes James Jennings is at higher than average risk for developing diabetes due to his obesity and pre-diabetes. He currently denies polyuria or polydipsia.  Hyperlipidemia James Jennings has hyperlipidemia and has been trying to improve his cholesterol levels with intensive lifestyle modification including a low saturated fat diet, exercise and weight loss. Last LDL was of 97 and HDL of 46. He is on statin and denies any chest pain, claudication or myalgias.  ASSESSMENT AND PLAN:  Prediabetes - Plan: metFORMIN (GLUCOPHAGE) 500 MG tablet  Other hyperlipidemia  At risk for diabetes mellitus  Class 2 severe obesity with serious comorbidity and body mass index (BMI) of 35.0 to 35.9 in adult, unspecified obesity type [facility])  PLAN:  Pre-Diabetes James Jennings will continue to work on  weight loss, exercise, and decreasing simple carbohydrates in his diet to help decrease the risk of diabetes. We dicussed metformin including benefits and risks. He was informed that eating too many simple carbohydrates or too many calories at one sitting increases the likelihood of GI side effects. James Jennings agrees to continue taking metformin 500 mg PO q AM #30 and we will refill for 1 month. James Jennings agrees to follow up with our clinic in 2 weeks as directed to monitor his progress.  Diabetes risk counseling James Jennings was given extended (15 minutes) diabetes prevention counseling today. He is 48 y.o. male and has risk factors for diabetes including obesity and pre-diabetes. We discussed intensive lifestyle modifications today with an emphasis on weight loss as well as increasing exercise and decreasing simple carbohydrates in his diet.  Hyperlipidemia James Jennings was informed of the American Heart Association Guidelines emphasizing intensive lifestyle modifications as the first line treatment for hyperlipidemia. We discussed many lifestyle modifications today in depth, and James Jennings will continue to work on decreasing saturated fats such as fatty red meat, butter and many fried foods. He will also increase vegetables and lean protein in his diet and continue to work on exercise and weight loss efforts. James Jennings agrees to continue taking statin, and he agrees to follow up with our clinic in 2 weeks.  Obesity James Jennings is currently in the action stage of change. As such, his goal is to continue with weight loss  efforts He has agreed to follow the Category 4 plan Lytle has been instructed to work up to a goal of 150 minutes of combined cardio and strengthening exercise per week for weight loss and overall health benefits. We discussed the following Behavioral Modification Strategies today: increasing lean protein intake, increasing vegetables and work on meal planning and easy cooking plans, keeping healthy foods in the home, and planning  for success   James Jennings has agreed to follow up with our clinic in 2 weeks. He was informed of the importance of frequent follow up visits to maximize his success with intensive lifestyle modifications for his multiple health conditions.  ALLERGIES: Allergies  Allergen Reactions  . Shellfish Allergy Anaphylaxis  . Lactose Intolerance (Gi) Diarrhea  . Mango Flavor Swelling and Other (See Comments)    MANGO(S) LIPS SWELL    MEDICATIONS: Current Outpatient Medications on File Prior to Visit  Medication Sig Dispense Refill  . acetaminophen (TYLENOL) 325 MG tablet Take 650 mg by mouth every 6 (six) hours as needed for moderate pain or headache.    [name] atorvastatin (LIPITOR) 10 MG tablet Take 10 mg by mouth daily.    . cetirizine (ZYRTEC) 10 MG tablet Take 10 mg by mouth daily as needed for allergies.    [name] ibuprofen (ADVIL) 400 MG tablet Take 400 mg by mouth once.    . multivitamin (ONE-A-DAY MEN'S) TABS tablet Take 1 tablet by mouth daily.     No current facility-administered medications on file prior to visit.     PAST MEDICAL HISTORY: Past Medical History:  Diagnosis Date  . Bilateral swelling of feet   . High cholesterol   . Joint pain   . Lactose intolerance   . Lymphoma, follicular ([place]) dx'd [id number]  . Multiple food allergies   . PONV (postoperative nausea and vomiting)   . Sleep apnea    "dx'd ~ 2008; never RX'd mask" (02/28/2016)    PAST SURGICAL HISTORY: Past Surgical History:  Procedure Laterality Date  . ACHILLES TENDON SURGERY Left ~ 2012  . APPENDECTOMY  11/12/2015   lap appy  . CLUB FOOT RELEASE Bilateral 1971  . HERNIA REPAIR    . INGUINAL LYMPH NODE BIOPSY Right 01/20/2016   Procedure: EXCISIONAL BIOPSY OF RIGHT INGUINAL LYMPH NODE;  Surgeon: Greer Pickerel, MD;  Location: [facility];  Service: General;  Laterality: Right;  . LAPAROSCOPIC APPENDECTOMY N/A 11/12/2015   Procedure: APPENDECTOMY LAPAROSCOPIC;  Surgeon: Greer Pickerel, MD;  Location: [facility];  Service: General;   Laterality: N/A;  . SUTURE REMOVAL Left ~ 2016-2017 X 3   "had to use permanent sutures w/my achilles OR; my body rejects them at times & I have to have them surgically removed; under anesthesia"  . VENTRAL HERNIA REPAIR  1994    SOCIAL HISTORY: Social History   Tobacco Use  . Smoking status: Current Every Day Smoker    Packs/day: 1.00    Years: 30.00    Pack years: 30.00    Types: Cigarettes  . Smokeless tobacco: Former Systems developer    Types: Snuff    Quit date: 3  . Tobacco comment: 11/12/2015 "quit using chew in ~ 1997"  Substance Use Topics  . Alcohol use: Yes    Alcohol/week: 4.0 standard drinks    Types: 4 Shots of liquor per week  . Drug use: Yes    Types: Marijuana    Comment: "in the early 1990s; recreational"    FAMILY HISTORY: History reviewed. No pertinent family history.  ROS:  Review of Systems  Constitutional: Positive for weight loss.  Cardiovascular: Negative for chest pain and claudication.  Genitourinary: Negative for frequency.  Musculoskeletal: Negative for myalgias.  Endo/Heme/Allergies: Negative for polydipsia.       Negative hypoglycemia    PHYSICAL EXAM: Pulse 78, temperature 98.1 F (36.7 C), temperature source Oral, height 5\' 11"  (1.803 m), weight 253 lb (114.8 kg), SpO2 96 %. Body mass index is 35.29 kg/m. Physical Exam Vitals signs reviewed.  Constitutional:      Appearance: Normal appearance. He is obese.  Cardiovascular:     Rate and Rhythm: Normal rate.     Pulses: Normal pulses.  Pulmonary:     Effort: Pulmonary effort is normal.     Breath sounds: Normal breath sounds.  Musculoskeletal: Normal range of motion.  Skin:    General: Skin is warm and dry.  Neurological:     Mental Status: He is alert and oriented to person, place, and time.  Psychiatric:        Mood and Affect: Mood normal.        Behavior: Behavior normal.     RECENT LABS AND TESTS: BMET    Component Value Date/Time   NA 143 08/10/2018 1604   NA 141  12/03/2016 1155   NA 141 02/07/2016 1149   K 4.3 08/10/2018 1604   K 3.8 12/03/2016 1155   K 4.3 02/07/2016 1149   CL 103 08/10/2018 1604   CL 100 12/03/2016 1155   CO2 23 08/10/2018 1604   CO2 27 12/03/2016 1155   CO2 27 02/07/2016 1149   GLUCOSE 109 (H) 08/10/2018 1604   GLUCOSE 132 (H) 07/25/2018 0750   GLUCOSE 128 (H) 12/03/2016 1155   BUN 12 08/10/2018 1604   BUN 10 12/03/2016 1155   BUN 10.9 02/07/2016 1149   CREATININE 0.80 08/10/2018 1604   CREATININE 1.17 07/25/2018 0750   CREATININE 1.0 12/03/2016 1155   CREATININE 0.9 02/07/2016 1149   CALCIUM 8.9 08/10/2018 1604   CALCIUM 9.1 12/03/2016 1155   CALCIUM 9.6 02/07/2016 1149   GFRNONAA 105 08/10/2018 1604   GFRNONAA >60 07/25/2018 0750   GFRAA 121 08/10/2018 1604   GFRAA >60 07/25/2018 0750   Lab Results  Component Value Date   HGBA1C 6.2 (H) 08/10/2018   Lab Results  Component Value Date   INSULIN 24.6 08/10/2018   CBC    Component Value Date/Time   WBC 4.9 08/10/2018 1604   WBC 4.7 07/25/2018 0750   WBC 4.1 02/29/2016 0720   RBC 4.50 08/10/2018 1604   RBC 4.29 07/25/2018 0750   HGB 14.0 08/10/2018 1604   HGB 14.4 12/03/2016 1155   HGB 14.2 12/06/2015 1606   HCT 41.3 08/10/2018 1604   HCT 41.1 12/03/2016 1155   HCT 40.9 12/06/2015 1606   PLT 197 07/25/2018 0750   PLT 227 12/03/2016 1155   PLT 257 12/06/2015 1606   MCV 92 08/10/2018 1604   MCV 92 12/03/2016 1155   MCV 89.7 12/06/2015 1606   MCH 31.1 08/10/2018 1604   MCH 31.0 07/25/2018 0750   MCHC 33.9 08/10/2018 1604   MCHC 33.6 07/25/2018 0750   RDW 12.8 08/10/2018 1604   RDW 12.9 12/03/2016 1155   RDW 13.0 12/06/2015 1606   LYMPHSABS 1.5 08/10/2018 1604   LYMPHSABS 1.1 12/03/2016 1155   LYMPHSABS 3.1 12/06/2015 1606   MONOABS 0.5 07/25/2018 0750   MONOABS 0.7 12/06/2015 1606   EOSABS 0.2 08/10/2018 1604   EOSABS 0.3 12/03/2016 1155  BASOSABS 0.0 08/10/2018 1604   BASOSABS 0.0 12/03/2016 1155   BASOSABS 0.0 12/06/2015 1606    Iron/TIBC/Ferritin/ %Sat No results found for: IRON, TIBC, FERRITIN, IRONPCTSAT Lipid Panel     Component Value Date/Time   CHOL 170 08/10/2018 1604   TRIG 134 08/10/2018 1604   HDL 46 08/10/2018 1604   LDLCALC 97 08/10/2018 1604   Hepatic Function Panel     Component Value Date/Time   PROT 6.5 08/10/2018 1604   PROT 7.1 12/03/2016 1155   PROT 7.4 02/07/2016 1149   ALBUMIN 4.4 08/10/2018 1604   ALBUMIN 3.9 02/07/2016 1149   AST 20 08/10/2018 1604   AST 18 07/25/2018 0750   AST 17 02/07/2016 1149   ALT 20 08/10/2018 1604   ALT 18 07/25/2018 0750   ALT 24 12/03/2016 1155   ALT 16 02/07/2016 1149   ALKPHOS 108 08/10/2018 1604   ALKPHOS 99 (H) 12/03/2016 1155   ALKPHOS 127 02/07/2016 1149   BILITOT 0.4 08/10/2018 1604   BILITOT 0.5 07/25/2018 0750   BILITOT 0.42 02/07/2016 1149      Component Value Date/Time   TSH 2.400 08/10/2018 1604      OBESITY BEHAVIORAL INTERVENTION VISIT  Today's visit was # 5   Starting weight: 266 lbs Starting date: 08/10/2018 Today's weight : 253 lbs Today's date: 10/25/2018 Total lbs lost to date: 13    ASK: We discussed the diagnosis of obesity with Howie Ill today and Devere agreed to give [place] permission to discuss obesity behavioral modification therapy today.  ASSESS: Dylanjames has the diagnosis of obesity and his BMI today is 35.3 Travarius is in the action stage of change   ADVISE: Jermarius was educated on the multiple health risks of obesity as well as the benefit of weight loss to improve his health. He was advised of the need for long term treatment and the importance of lifestyle modifications to improve his current health and to decrease his risk of future health problems.  AGREE: Multiple dietary modification options and treatment options were discussed and  Hilary agreed to follow the recommendations documented in the above note.  ARRANGE: Mizell was educated on the importance of frequent visits to treat obesity as outlined per CMS and  USPSTF guidelines and agreed to schedule his next follow up appointment today.  I, Trixie Dredge, am acting as transcriptionist for Ilene Qua, MD  I have reviewed the above documentation for accuracy and completeness, and I agree with the above. - Ilene Qua, MD

## 2018-11-15 ENCOUNTER — Ambulatory Visit (INDEPENDENT_AMBULATORY_CARE_PROVIDER_SITE_OTHER): Payer: 59 | Admitting: Physician Assistant

## 2019-01-13 ENCOUNTER — Other Ambulatory Visit: Payer: Self-pay

## 2019-01-13 DIAGNOSIS — Z20822 Contact with and (suspected) exposure to covid-19: Secondary | ICD-10-CM

## 2019-01-15 LAB — NOVEL CORONAVIRUS, NAA: SARS-CoV-2, NAA: NOT DETECTED

## 2019-01-17 ENCOUNTER — Ambulatory Visit: Payer: 59 | Admitting: Family

## 2019-01-17 ENCOUNTER — Other Ambulatory Visit: Payer: 59

## 2019-01-25 ENCOUNTER — Ambulatory Visit: Payer: 59 | Attending: Internal Medicine

## 2019-01-25 DIAGNOSIS — Z20822 Contact with and (suspected) exposure to covid-19: Secondary | ICD-10-CM

## 2019-01-26 LAB — NOVEL CORONAVIRUS, NAA: SARS-CoV-2, NAA: NOT DETECTED

## 2019-02-06 ENCOUNTER — Telehealth: Payer: Self-pay | Admitting: *Deleted

## 2019-02-06 ENCOUNTER — Telehealth: Payer: Self-pay | Admitting: Family

## 2019-02-06 NOTE — Telephone Encounter (Signed)
Received a call from patient that he needs to reschedule his apointment tomorrow because he has congestion, cough and sinus issues.  Got tested on 01/24/2019 and was negative for Covid however in light of his symptoms needs to reschedule.  Scheduling notified.  Appointments changed.  Patient had a rough experience with Rituxan a few years back.  Asked whether this means he will have a problem with COVID vaccine.  Dr. Marin Olp said this should not cause a problem.Ok to get vaccine.

## 2019-02-06 NOTE — Telephone Encounter (Signed)
Patient called to reschedule his 1/5 appointments.  Appointments moved to 1/19 as requested by patient

## 2019-02-07 ENCOUNTER — Inpatient Hospital Stay: Payer: 59 | Admitting: Family

## 2019-02-07 ENCOUNTER — Inpatient Hospital Stay: Payer: 59

## 2019-02-20 ENCOUNTER — Telehealth: Payer: Self-pay | Admitting: *Deleted

## 2019-02-20 NOTE — Telephone Encounter (Signed)
Message received from patient stating that he continues with chest congestion and cough and started on Augmentin last week per his PCP and would like to know if he should reschedule his appt for 02/21/19.  Message sent to scheduling to call patient and move his appt out by two weeks.

## 2019-02-21 ENCOUNTER — Inpatient Hospital Stay: Payer: 59

## 2019-02-21 ENCOUNTER — Inpatient Hospital Stay: Payer: 59 | Admitting: Family

## 2019-03-06 ENCOUNTER — Telehealth: Payer: Self-pay | Admitting: *Deleted

## 2019-03-06 NOTE — Telephone Encounter (Signed)
Patient called to check and see if he is ok to come in for appt on Wednesday.  Patient had covid test Dec 22 which was negative.  Had cough and congestion and was prescribed antibiotics at that time.  Patient cough and congestion have cleared.  Occasionally coughs to clear his throat but that is it.  No other signs or symptoms of covid.  Mier for appt on Wednesday.

## 2019-03-08 ENCOUNTER — Other Ambulatory Visit: Payer: Self-pay

## 2019-03-08 ENCOUNTER — Inpatient Hospital Stay: Payer: 59 | Attending: Family

## 2019-03-08 ENCOUNTER — Encounter: Payer: Self-pay | Admitting: Family

## 2019-03-08 ENCOUNTER — Inpatient Hospital Stay (HOSPITAL_BASED_OUTPATIENT_CLINIC_OR_DEPARTMENT_OTHER): Payer: 59 | Admitting: Family

## 2019-03-08 VITALS — BP 112/73 | HR 81 | Temp 97.5°F | Resp 18 | Ht 71.0 in | Wt 256.1 lb

## 2019-03-08 DIAGNOSIS — Z7984 Long term (current) use of oral hypoglycemic drugs: Secondary | ICD-10-CM | POA: Diagnosis not present

## 2019-03-08 DIAGNOSIS — Z85828 Personal history of other malignant neoplasm of skin: Secondary | ICD-10-CM | POA: Diagnosis not present

## 2019-03-08 DIAGNOSIS — Z79899 Other long term (current) drug therapy: Secondary | ICD-10-CM | POA: Insufficient documentation

## 2019-03-08 DIAGNOSIS — C8223 Follicular lymphoma grade III, unspecified, intra-abdominal lymph nodes: Secondary | ICD-10-CM | POA: Diagnosis not present

## 2019-03-08 DIAGNOSIS — Z9221 Personal history of antineoplastic chemotherapy: Secondary | ICD-10-CM | POA: Insufficient documentation

## 2019-03-08 DIAGNOSIS — R61 Generalized hyperhidrosis: Secondary | ICD-10-CM | POA: Insufficient documentation

## 2019-03-08 DIAGNOSIS — Z8572 Personal history of non-Hodgkin lymphomas: Secondary | ICD-10-CM | POA: Diagnosis not present

## 2019-03-08 LAB — CBC WITH DIFFERENTIAL (CANCER CENTER ONLY)
Abs Immature Granulocytes: 0.02 10*3/uL (ref 0.00–0.07)
Basophils Absolute: 0 10*3/uL (ref 0.0–0.1)
Basophils Relative: 1 %
Eosinophils Absolute: 0.2 10*3/uL (ref 0.0–0.5)
Eosinophils Relative: 3 %
HCT: 41.6 % (ref 39.0–52.0)
Hemoglobin: 14 g/dL (ref 13.0–17.0)
Immature Granulocytes: 0 %
Lymphocytes Relative: 23 %
Lymphs Abs: 1.5 10*3/uL (ref 0.7–4.0)
MCH: 30.2 pg (ref 26.0–34.0)
MCHC: 33.7 g/dL (ref 30.0–36.0)
MCV: 89.7 fL (ref 80.0–100.0)
Monocytes Absolute: 0.8 10*3/uL (ref 0.1–1.0)
Monocytes Relative: 12 %
Neutro Abs: 4 10*3/uL (ref 1.7–7.7)
Neutrophils Relative %: 61 %
Platelet Count: 227 10*3/uL (ref 150–400)
RBC: 4.64 MIL/uL (ref 4.22–5.81)
RDW: 12.1 % (ref 11.5–15.5)
WBC Count: 6.5 10*3/uL (ref 4.0–10.5)
nRBC: 0 % (ref 0.0–0.2)

## 2019-03-08 LAB — CMP (CANCER CENTER ONLY)
ALT: 17 U/L (ref 0–44)
AST: 11 U/L — ABNORMAL LOW (ref 15–41)
Albumin: 4.4 g/dL (ref 3.5–5.0)
Alkaline Phosphatase: 102 U/L (ref 38–126)
Anion gap: 7 (ref 5–15)
BUN: 22 mg/dL — ABNORMAL HIGH (ref 6–20)
CO2: 29 mmol/L (ref 22–32)
Calcium: 9.4 mg/dL (ref 8.9–10.3)
Chloride: 103 mmol/L (ref 98–111)
Creatinine: 0.98 mg/dL (ref 0.61–1.24)
GFR, Est AFR Am: >60 mL/min (ref >60)
GFR, Estimated: >60 mL/min (ref >60)
Glucose, Bld: 117 mg/dL — ABNORMAL HIGH (ref 70–99)
Potassium: 4.2 mmol/L (ref 3.5–5.1)
Sodium: 139 mmol/L (ref 135–145)
Total Bilirubin: 0.5 mg/dL (ref 0.3–1.2)
Total Protein: 7.1 g/dL (ref 6.5–8.1)

## 2019-03-08 LAB — LACTATE DEHYDROGENASE: LDH: 149 U/L (ref 98–192)

## 2019-03-08 NOTE — Progress Notes (Signed)
Hematology and Oncology Follow Up Visit  James Jennings [id number] 1970-12-17 49 y.o. 03/08/2019   Principle Diagnosis:  Follicular large cell non-Hodgkin's lymphoma (FLIPI-2 = 0)  Past Therapy:   Rituxan/bendamustine-s/p cycle 6 - completed on 07/03/2016  Current Therapy: Observation   Interim History:  James Jennings is here today for follow-up. He is doing well and has no complaints at this time. He is staying busy with work, family and a new puppy.  He states that since we last saw him he had a basal cell carcinoma removed from his left temple. He follows up regularly with his dermatologist.  No adenopathy noted on exam.  No fever, chills, n/v, cough, rash, dizziness, SOB, chest pain, palpitations, abdominal pain or changes in bowel or bladder habits.  He states that he still sometimes has night sweats since treatment.  No episodes of bleeding. No bruising or petechiae.  No swelling or tenderness in his extremities at this time.  No falls or syncopal episodes to report.  He is working on eating healthier and exercising to lose weight. He is hydrating well.   ECOG Performance Status: 0 - Asymptomatic  Medications:  Allergies as of 03/08/2019      Reactions   Mango Flavor Swelling, Other (See Comments)   MANGO(S) LIPS SWELL   Shellfish Allergy Anaphylaxis   Lactose Intolerance (gi) Diarrhea      Medication List       Accurate as of March 08, 2019  8:47 AM. If you have any questions, ask your nurse or doctor.        acetaminophen 325 MG tablet Commonly known as: TYLENOL Take 650 mg by mouth every 6 (six) hours as needed for moderate pain or headache.   atorvastatin 10 MG tablet Commonly known as: LIPITOR Take 10 mg by mouth daily.   cetirizine 10 MG tablet Commonly known as: ZYRTEC Take 10 mg by mouth daily as needed for allergies.   ibuprofen 400 MG tablet Commonly known as: ADVIL Take 400 mg by mouth once.   metFORMIN 500 MG tablet Commonly known as:  GLUCOPHAGE Take 1 tablet (500 mg total) by mouth daily with breakfast.   multivitamin Tabs tablet Take 1 tablet by mouth daily.       Allergies:  Allergies  Allergen Reactions  . Mango Flavor Swelling and Other (See Comments)    MANGO(S) LIPS SWELL  . Shellfish Allergy Anaphylaxis  . Lactose Intolerance (Gi) Diarrhea    Past Medical History, Surgical history, Social history, and Family History were reviewed and updated.  Review of Systems: All other 10 point review of systems is negative.   Physical Exam:  height is 5\' 11"  (1.803 m) and weight is 256 lb 1.9 oz (116.2 kg). His temporal temperature is 97.5 F (36.4 C) (abnormal). His blood pressure is 112/73 and his pulse is 81. His respiration is 18 and oxygen saturation is 100%.   Wt Readings from Last 3 Encounters:  03/08/19 256 lb 1.9 oz (116.2 kg)  10/25/18 253 lb (114.8 kg)  09/13/18 256 lb (116.1 kg)    Ocular: Sclerae unicteric, pupils equal, round and reactive to light Ear-nose-throat: Oropharynx clear, dentition fair Lymphatic: No cervical, supraclavicular or axillary adenopathy Lungs no rales or rhonchi, good excursion bilaterally Heart regular rate and rhythm, no murmur appreciated Abd soft, nontender, positive bowel sounds, no liver or spleen tip palpated on exam, no fluid wave  MSK no focal spinal tenderness, no joint edema Neuro: non-focal, well-oriented, appropriate affect Breasts: Deferred  Lab Results  Component Value Date   WBC 6.5 03/08/2019   HGB 14.0 03/08/2019   HCT 41.6 03/08/2019   MCV 89.7 03/08/2019   PLT 227 03/08/2019   No results found for: FERRITIN, IRON, TIBC, UIBC, IRONPCTSAT Lab Results  Component Value Date   RETICCTPCT 1.07 12/06/2015   RBC 4.64 03/08/2019   RETICCTABS 48.79 12/06/2015   No results found for: Nils Pyle [facility] Lab Results  Component Value Date   IGGSERUM 785 06/04/2016   IGMSERUM 52 06/04/2016   No results found for: Kathrynn Ducking, MSPIKE, SPEI   Chemistry      Component Value Date/Time   NA 143 08/10/2018 1604   NA 141 12/03/2016 1155   NA 141 02/07/2016 1149   K 4.3 08/10/2018 1604   K 3.8 12/03/2016 1155   K 4.3 02/07/2016 1149   CL 103 08/10/2018 1604   CL 100 12/03/2016 1155   CO2 23 08/10/2018 1604   CO2 27 12/03/2016 1155   CO2 27 02/07/2016 1149   BUN 12 08/10/2018 1604   BUN 10 12/03/2016 1155   BUN 10.9 02/07/2016 1149   CREATININE 0.80 08/10/2018 1604   CREATININE 1.17 07/25/2018 0750   CREATININE 1.0 12/03/2016 1155   CREATININE 0.9 02/07/2016 1149      Component Value Date/Time   CALCIUM 8.9 08/10/2018 1604   CALCIUM 9.1 12/03/2016 1155   CALCIUM 9.6 02/07/2016 1149   ALKPHOS 108 08/10/2018 1604   ALKPHOS 99 (H) 12/03/2016 1155   ALKPHOS 127 02/07/2016 1149   AST 20 08/10/2018 1604   AST 18 07/25/2018 0750   AST 17 02/07/2016 1149   ALT 20 08/10/2018 1604   ALT 18 07/25/2018 0750   ALT 24 12/03/2016 1155   ALT 16 02/07/2016 1149   BILITOT 0.4 08/10/2018 1604   BILITOT 0.5 07/25/2018 0750   BILITOT 0.42 02/07/2016 1149       Impression and Plan: James Jennings a pleasant 49 yo caucasian gentleman with history of follicular large cell non-Hodgkin's lymphoma. He completed 6 cycles of chemotherapy with bendamustine on 07/03/2016 but was not tolerant of Rituxan. He continues to do well and so far there has been no evidence of recurrence.  We will see him again in another 6 months.  He will contact our office with any questions or concerns. We can certainly see him sooner if needed.   Laverna Peace, NP 2/3/20218:47 AM

## 2019-07-04 HISTORY — PX: VASECTOMY: SHX75

## 2019-09-05 ENCOUNTER — Inpatient Hospital Stay: Payer: 59 | Attending: Hematology & Oncology | Admitting: Hematology & Oncology

## 2019-09-05 ENCOUNTER — Telehealth: Payer: Self-pay | Admitting: Hematology & Oncology

## 2019-09-05 ENCOUNTER — Inpatient Hospital Stay: Payer: 59

## 2019-09-05 ENCOUNTER — Other Ambulatory Visit: Payer: Self-pay

## 2019-09-05 ENCOUNTER — Encounter: Payer: Self-pay | Admitting: Hematology & Oncology

## 2019-09-05 VITALS — BP 147/75 | HR 75 | Temp 98.8°F | Resp 17 | Wt 276.0 lb

## 2019-09-05 DIAGNOSIS — C8223 Follicular lymphoma grade III, unspecified, intra-abdominal lymph nodes: Secondary | ICD-10-CM | POA: Insufficient documentation

## 2019-09-05 DIAGNOSIS — Z9221 Personal history of antineoplastic chemotherapy: Secondary | ICD-10-CM | POA: Diagnosis not present

## 2019-09-05 LAB — CBC WITH DIFFERENTIAL (CANCER CENTER ONLY)
Abs Immature Granulocytes: 0.02 10*3/uL (ref 0.00–0.07)
Basophils Absolute: 0 10*3/uL (ref 0.0–0.1)
Basophils Relative: 0 %
Eosinophils Absolute: 0.1 10*3/uL (ref 0.0–0.5)
Eosinophils Relative: 3 %
HCT: 39.5 % (ref 39.0–52.0)
Hemoglobin: 13.6 g/dL (ref 13.0–17.0)
Immature Granulocytes: 0 %
Lymphocytes Relative: 19 %
Lymphs Abs: 1 10*3/uL (ref 0.7–4.0)
MCH: 31.1 pg (ref 26.0–34.0)
MCHC: 34.4 g/dL (ref 30.0–36.0)
MCV: 90.2 fL (ref 80.0–100.0)
Monocytes Absolute: 0.6 10*3/uL (ref 0.1–1.0)
Monocytes Relative: 10 %
Neutro Abs: 3.6 10*3/uL (ref 1.7–7.7)
Neutrophils Relative %: 68 %
Platelet Count: 211 10*3/uL (ref 150–400)
RBC: 4.38 MIL/uL (ref 4.22–5.81)
RDW: 12.2 % (ref 11.5–15.5)
WBC Count: 5.3 10*3/uL (ref 4.0–10.5)
nRBC: 0 % (ref 0.0–0.2)

## 2019-09-05 LAB — LACTATE DEHYDROGENASE: LDH: 183 U/L (ref 98–192)

## 2019-09-05 LAB — CMP (CANCER CENTER ONLY)
ALT: 19 U/L (ref 0–44)
AST: 21 U/L (ref 15–41)
Albumin: 4.5 g/dL (ref 3.5–5.0)
Alkaline Phosphatase: 103 U/L (ref 38–126)
Anion gap: 7 (ref 5–15)
BUN: 18 mg/dL (ref 6–20)
CO2: 27 mmol/L (ref 22–32)
Calcium: 9.4 mg/dL (ref 8.9–10.3)
Chloride: 105 mmol/L (ref 98–111)
Creatinine: 0.96 mg/dL (ref 0.61–1.24)
GFR, Est AFR Am: >60 mL/min (ref >60)
GFR, Estimated: >60 mL/min (ref >60)
Glucose, Bld: 139 mg/dL — ABNORMAL HIGH (ref 70–99)
Potassium: 4.2 mmol/L (ref 3.5–5.1)
Sodium: 139 mmol/L (ref 135–145)
Total Bilirubin: 0.5 mg/dL (ref 0.3–1.2)
Total Protein: 6.9 g/dL (ref 6.5–8.1)

## 2019-09-05 NOTE — Telephone Encounter (Signed)
Appointments scheduled calendar mailed per 8/2 los

## 2019-09-05 NOTE — Progress Notes (Signed)
Hematology and Oncology Follow Up Visit  James Jennings [id number] 03-02-1970 49 y.o. 09/05/2019   Principle Diagnosis:  Follicular large cell non-Hodgkin's lymphoma (FLIPI-2 = 0)  Past Therapy:   Rituxan/bendamustine-s/p cycle 6 - completed on 07/03/2016  Current Therapy: Observation   Interim History:  James Jennings is here today for follow-up.  He is doing okay.  Unfortunately, he is getting quite a bit of weight secondary to the coronavirus.  He has not been able to exercise like he likes.  His wife, an 43, was diagnosed with DCIS.  She is being seen at the [facility].  It sounds like she will have a lumpectomy and radiation.  We will certainly pray hard for her.  He did have a vasectomy.  This was back in June.  He has noticed a lymph node or a nodule in the left inguinal area.  Given the fact that he does have a history of low-grade lymphoma, we are going to have to assess this.  I think he will need a CT scan so we can see what is going on down to the inguinal area but also elsewhere.  He is not traveling.  His company was bought out by [employer].  This is somewhat of an adjustment for him.  He seems to be having no problems with bowels or bladder.  He has had no cough or shortness of breath.  He is still smoke-free.  He has had no rashes.  Has had no fever.  Has had no bleeding.  Occasionally has swelling in the left leg.  His family doctor, James Jennings, is following up on this.       Medications:  Allergies as of 09/05/2019      Reactions   Mango Flavor Swelling, Other (See Comments)   MANGO(S) LIPS SWELL   Shellfish Allergy Anaphylaxis   Lactose Intolerance (gi) Diarrhea      Medication List       Accurate as of September 05, 2019  8:47 AM. If you have any questions, ask your nurse or doctor.        acetaminophen 325 MG tablet Commonly known as: TYLENOL Take 650 mg by mouth every 6 (six) hours as needed for moderate pain or headache.   atorvastatin 10 MG  tablet Commonly known as: LIPITOR Take 10 mg by mouth daily.   cetirizine 10 MG tablet Commonly known as: ZYRTEC Take 10 mg by mouth daily as needed for allergies.   ibuprofen 400 MG tablet Commonly known as: ADVIL Take 400 mg by mouth once.   metFORMIN 500 MG tablet Commonly known as: GLUCOPHAGE Take 1 tablet (500 mg total) by mouth daily with breakfast.   multivitamin Tabs tablet Take 1 tablet by mouth daily.       Allergies:  Allergies  Allergen Reactions  . Mango Flavor Swelling and Other (See Comments)    MANGO(S) LIPS SWELL  . Shellfish Allergy Anaphylaxis  . Lactose Intolerance (Gi) Diarrhea    Past Medical History, Surgical history, Social history, and Family History were reviewed and updated.  Review of Systems: Review of Systems  Constitutional: Negative.   HENT: Negative.   Eyes: Negative.   Respiratory: Negative.   Cardiovascular: Negative.   Gastrointestinal: Negative.   Genitourinary: Negative.   Musculoskeletal: Negative.   Skin: Negative.   Neurological: Negative.   Endo/Heme/Allergies: Negative.   Psychiatric/Behavioral: Negative.       Physical Exam:  weight is 276 lb (125.2 kg). His oral temperature is 98.8  F (37.1 C). His blood pressure is 147/75 (abnormal) and his pulse is 75. His respiration is 17 and oxygen saturation is 97%.   Wt Readings from Last 3 Encounters:  09/05/19 276 lb (125.2 kg)  03/08/19 256 lb 1.9 oz (116.2 kg)  10/25/18 253 lb (114.8 kg)    Physical Exam Vitals reviewed.  HENT:     Head: Normocephalic and atraumatic.  Eyes:     Pupils: Pupils are equal, round, and reactive to light.  Cardiovascular:     Rate and Rhythm: Normal rate and regular rhythm.     Heart sounds: Normal heart sounds.  Pulmonary:     Effort: Pulmonary effort is normal.     Breath sounds: Normal breath sounds.  Abdominal:     General: Bowel sounds are normal.     Palpations: Abdomen is soft.     Comments: Soft abdomen.  He is  somewhat obese.  He does have a palpable lymph node in the left inguinal area.  This point measures about 2 cm.  It is somewhat firm and nonmobile.  There is no palpable liver or spleen tip.  Musculoskeletal:        General: No tenderness or deformity. Normal range of motion.     Cervical back: Normal range of motion.  Lymphadenopathy:     Cervical: No cervical adenopathy.  Skin:    General: Skin is warm and dry.     Findings: No erythema or rash.  Neurological:     Mental Status: He is alert and oriented to person, place, and time.  Psychiatric:        Behavior: Behavior normal.        Thought Content: Thought content normal.        Judgment: Judgment normal.       Lab Results  Component Value Date   WBC 5.3 09/05/2019   HGB 13.6 09/05/2019   HCT 39.5 09/05/2019   MCV 90.2 09/05/2019   PLT 211 09/05/2019   No results found for: FERRITIN, IRON, TIBC, UIBC, IRONPCTSAT Lab Results  Component Value Date   RETICCTPCT 1.07 12/06/2015   RBC 4.38 09/05/2019   RETICCTABS 48.79 12/06/2015   No results found for: Nils Pyle [facility] Lab Results  Component Value Date   IGGSERUM 785 06/04/2016   IGMSERUM 52 06/04/2016   No results found for: Kathrynn Ducking, MSPIKE, SPEI   Chemistry      Component Value Date/Time   NA 139 09/05/2019 0808   NA 143 08/10/2018 1604   NA 141 12/03/2016 1155   NA 141 02/07/2016 1149   K 4.2 09/05/2019 0808   K 3.8 12/03/2016 1155   K 4.3 02/07/2016 1149   CL 105 09/05/2019 0808   CL 100 12/03/2016 1155   CO2 27 09/05/2019 0808   CO2 27 12/03/2016 1155   CO2 27 02/07/2016 1149   BUN 18 09/05/2019 0808   BUN 12 08/10/2018 1604   BUN 10 12/03/2016 1155   BUN 10.9 02/07/2016 1149   CREATININE 0.96 09/05/2019 0808   CREATININE 1.0 12/03/2016 1155   CREATININE 0.9 02/07/2016 1149      Component Value Date/Time   CALCIUM 9.4 09/05/2019 0808   CALCIUM 9.1 12/03/2016 1155   CALCIUM  9.6 02/07/2016 1149   ALKPHOS 103 09/05/2019 0808   ALKPHOS 99 (H) 12/03/2016 1155   ALKPHOS 127 02/07/2016 1149   AST 21 09/05/2019 0808   AST 17 02/07/2016 1149   ALT 19  09/05/2019 0808   ALT 24 12/03/2016 1155   ALT 16 02/07/2016 1149   BILITOT 0.5 09/05/2019 0808   BILITOT 0.42 02/07/2016 1149       Impression and Plan: [name] a pleasant 49 yo caucasian gentleman with history of follicular large cell non-Hodgkin's lymphoma. He completed 6 cycles of chemotherapy with bendamustine on 07/03/2016 but was not tolerant of Rituxan.  We will go ahead and get him set up with a CT scan.  This will be done hopefully this week or next week.  Again, I suspect that this lymph node might be reactive after the vasectomy.  But, given the fact that he has a low-grade lymphoma, we I think we do have to assess for recurrence.  If all looks good, we will get him back in 6 more months.  [name], MD 8/3/20218:47 AM

## 2019-09-08 ENCOUNTER — Encounter: Payer: Self-pay | Admitting: Hematology & Oncology

## 2019-09-08 ENCOUNTER — Other Ambulatory Visit: Payer: Self-pay

## 2019-09-08 ENCOUNTER — Encounter (HOSPITAL_BASED_OUTPATIENT_CLINIC_OR_DEPARTMENT_OTHER): Payer: Self-pay

## 2019-09-08 ENCOUNTER — Other Ambulatory Visit: Payer: Self-pay | Admitting: Hematology & Oncology

## 2019-09-08 ENCOUNTER — Ambulatory Visit (HOSPITAL_BASED_OUTPATIENT_CLINIC_OR_DEPARTMENT_OTHER)
Admission: RE | Admit: 2019-09-08 | Discharge: 2019-09-08 | Disposition: A | Discharge status: Home / Self Care | Payer: 59 | Source: Ambulatory Visit | Attending: Hematology & Oncology | Admitting: Hematology & Oncology

## 2019-09-08 DIAGNOSIS — C8223 Follicular lymphoma grade III, unspecified, intra-abdominal lymph nodes: Secondary | ICD-10-CM | POA: Insufficient documentation

## 2019-09-08 MED ORDER — IOHEXOL 300 MG/ML  SOLN
100.0000 mL | Freq: Once | INTRAMUSCULAR | Status: AC | PRN
  Administered 2019-09-08: 100 mL via INTRAVENOUS

## 2019-09-11 ENCOUNTER — Encounter (HOSPITAL_COMMUNITY): Payer: Self-pay | Admitting: Radiology

## 2019-09-11 NOTE — Progress Notes (Signed)
James Jennings Male, 49 y.o., 1970/10/15 MRN:  [id number] Phone:  [phone] [name]) PCP:  Aura Dials, MD Coverage:  [place] [facility]/[employer] Other Next Appt With Radiology ([facility]-US 2) 09/13/2019 at 1:00 PM  RE: STAT  US Core Biopsy (lymph Node) Received: 2 days ago Markus Daft, MD  Garth Bigness D US guided inguinal lymph node biopsy.   [place]       Previous Messages   ----- Message -----  From: Garth Bigness D  Sent: 09/09/2019 10:19 AM EDT  To: Ir Procedure Requests  Subject: STAT  US Core Biopsy (lymph Node)        Procedure: [place] CORE BIOPSY (LYMPH NODES)   Reason: Follicular lymphoma grade III of intra-abdominal lymph nodes, ?? recurrent NHL. MUST have multiple biopsies for molecular studies   History:  CT in computer   Provider: [name]   Provider Contact: [phone]

## 2019-09-12 ENCOUNTER — Other Ambulatory Visit: Payer: Self-pay | Admitting: Student

## 2019-09-12 ENCOUNTER — Encounter (HOSPITAL_COMMUNITY): Payer: Self-pay | Admitting: *Deleted

## 2019-09-12 NOTE — Progress Notes (Signed)
Pt called with questions regarding his procedure tomorrow. Advised pt to have nothing by mouth after 7am except sips of water with meds. Advised pt to arrive at admitting at 11am then procedure to follow at 1pm. Advised pt of visitation policy. Pt verbalized understanding.

## 2019-09-13 ENCOUNTER — Ambulatory Visit (HOSPITAL_COMMUNITY)
Admission: RE | Admit: 2019-09-13 | Discharge: 2019-09-13 | Disposition: A | Discharge status: Home / Self Care | Payer: 59 | Source: Ambulatory Visit | Attending: Hematology & Oncology | Admitting: Hematology & Oncology

## 2019-09-13 ENCOUNTER — Other Ambulatory Visit: Payer: Self-pay

## 2019-09-13 ENCOUNTER — Encounter (HOSPITAL_COMMUNITY): Payer: Self-pay

## 2019-09-13 DIAGNOSIS — C8330 Diffuse large B-cell lymphoma, unspecified site: Secondary | ICD-10-CM | POA: Diagnosis not present

## 2019-09-13 DIAGNOSIS — R590 Localized enlarged lymph nodes: Secondary | ICD-10-CM | POA: Insufficient documentation

## 2019-09-13 DIAGNOSIS — C8223 Follicular lymphoma grade III, unspecified, intra-abdominal lymph nodes: Secondary | ICD-10-CM

## 2019-09-13 DIAGNOSIS — Z79899 Other long term (current) drug therapy: Secondary | ICD-10-CM | POA: Insufficient documentation

## 2019-09-13 DIAGNOSIS — Z7984 Long term (current) use of oral hypoglycemic drugs: Secondary | ICD-10-CM | POA: Diagnosis not present

## 2019-09-13 MED ORDER — FENTANYL CITRATE (PF) 100 MCG/2ML IJ SOLN
INTRAMUSCULAR | Status: AC
  Filled 2019-09-13: qty 2

## 2019-09-13 MED ORDER — LIDOCAINE HCL (PF) 1 % IJ SOLN
INTRAMUSCULAR | Status: AC
  Filled 2019-09-13: qty 30

## 2019-09-13 MED ORDER — MIDAZOLAM HCL 2 MG/2ML IJ SOLN
INTRAMUSCULAR | Status: AC | PRN
  Administered 2019-09-13: 1 mg via INTRAVENOUS

## 2019-09-13 MED ORDER — SODIUM CHLORIDE 0.9 % IV SOLN: INTRAVENOUS | Status: DC

## 2019-09-13 MED ORDER — MIDAZOLAM HCL 2 MG/2ML IJ SOLN
INTRAMUSCULAR | Status: AC
  Filled 2019-09-13: qty 2

## 2019-09-13 MED ORDER — FENTANYL CITRATE (PF) 100 MCG/2ML IJ SOLN
INTRAMUSCULAR | Status: AC | PRN
  Administered 2019-09-13: 50 ug via INTRAVENOUS

## 2019-09-13 NOTE — Discharge Instructions (Signed)

## 2019-09-13 NOTE — H&P (Addendum)
Chief Complaint: Patient was seen in consultation today for left inguinal lymph node biopsy at the request of Ennever,Peter R  Referring Physician(s): Ennever,Peter R  Supervising Physician: Aletta Edouard  Patient Status: [facility] - Out-pt  History of Present Illness: James Jennings is a 49 y.o. male   Known Hx NHL - 2018 Follows with Dr Marin Olp Had vasectomy surgery 07/2019 and noticed left inguinal LN after that  CT 09/08/19: IMPRESSION: Chest Impression: 1. No evidence of lymphoma in the thorax. 2. Benign calcified mediastinal lymph nodes. Abdomen / Pelvis Impression: 1. Bulky LEFT inguinal adenopathy. 2. Bilateral iliac and periaortic lymphadenopathy. 3. Findings consistent lymphoma recurrence. 4. Normal volume spleen.  Scheduled now for Left inguinal LN biopsy    Past Medical History:  Diagnosis Date  . Bilateral swelling of feet   . High cholesterol   . Joint pain   . Lactose intolerance   . Lymphoma, follicular ([place]) dx'd 43/3295  . Multiple food allergies   . PONV (postoperative nausea and vomiting)   . Sleep apnea    "dx'd ~ 2008; never RX'd mask" (02/28/2016)    Past Surgical History:  Procedure Laterality Date  . ACHILLES TENDON SURGERY Left ~ 2012  . APPENDECTOMY  11/12/2015   lap appy  . CLUB FOOT RELEASE Bilateral 1971  . HERNIA REPAIR    . INGUINAL LYMPH NODE BIOPSY Right 01/20/2016   Procedure: EXCISIONAL BIOPSY OF RIGHT INGUINAL LYMPH NODE;  Surgeon: Greer Pickerel, MD;  Location: [place];  Service: General;  Laterality: Right;  . LAPAROSCOPIC APPENDECTOMY N/A 11/12/2015   Procedure: APPENDECTOMY LAPAROSCOPIC;  Surgeon: Greer Pickerel, MD;  Location: [place];  Service: General;  Laterality: N/A;  . SUTURE REMOVAL Left ~ 2016-2017 X 3   "had to use permanent sutures w/my achilles OR; my body rejects them at times & I have to have them surgically removed; under anesthesia"  . VASECTOMY Bilateral 07/2019  . VENTRAL HERNIA REPAIR  1994    Allergies: Mango  flavor, Shellfish allergy, and Lactose intolerance (gi)  Medications: Prior to Admission medications   Medication Sig Start Date End Date Taking? Authorizing Provider  atorvastatin (LIPITOR) 80 MG tablet Take 80 mg by mouth daily.    Yes [provider]  cetirizine (ZYRTEC) 10 MG tablet Take 10 mg by mouth daily as needed for allergies.    Yes [provider]  ibuprofen (ADVIL) 200 MG tablet Take 600 mg by mouth at bedtime.    Yes [provider]  metFORMIN (GLUCOPHAGE) 500 MG tablet Take 1 tablet (500 mg total) by mouth daily with breakfast. 10/25/18 09/12/19 Yes [name] Jones, MD  multivitamin (ONE-A-DAY MEN'S) TABS tablet Take 1 tablet by mouth daily.   Yes [provider]  acetaminophen (TYLENOL) 325 MG tablet Take 650 mg by mouth every 6 (six) hours as needed for moderate pain or headache. Patient not taking: Reported on 09/12/2019    [provider]     History reviewed. No pertinent family history.  Social History   Socioeconomic History  . Marital status: Married    Spouse name: Not on file  . Number of children: Not on file  . Years of education: Not on file  . Highest education level: Not on file  Occupational History  . Not on file  Tobacco Use  . Smoking status: Former Smoker    Packs/day: 1.00    Years: 30.00    Pack years: 30.00    Types: Cigarettes    Quit date:  05/05/2017    Years since quitting: 2.3  . Smokeless tobacco: Former Systems developer    Types: Snuff    Quit date: 34  . Tobacco comment: 11/12/2015 "quit using chew in ~ 1997"  Vaping Use  . Vaping Use: Former  . Quit date: 05/05/2017  Substance and Sexual Activity  . Alcohol use: Yes    Alcohol/week: 4.0 standard drinks    Types: 4 Shots of liquor per week  . Drug use: Yes    Types: Marijuana    Comment: "in the early 1990s; recreational"  . Sexual activity: Yes  Other Topics Concern  . Not on file  Social History Narrative  . Not on file   Social  Determinants of Jennings   Financial Resource Strain:   . Difficulty of Paying Living Expenses:   Food Insecurity:   . Worried About Charity fundraiser in the Last Year:   . Arboriculturist in the Last Year:   Transportation Needs:   . Film/video editor (Medical):   [name] Lack of Transportation (Non-Medical):   Physical Activity:   . Days of Exercise per Week:   . Minutes of Exercise per Session:   Stress:   . Feeling of Stress :   Social Connections:   . Frequency of Communication with Friends and Family:   . Frequency of Social Gatherings with Friends and Family:   . Attends Religious Services:   . Active Member of Clubs or Organizations:   . Attends Archivist Meetings:   [name] Marital Status:      Review of Systems: A 12 point ROS discussed and pertinent positives are indicated in the HPI above.  All other systems are negative.  Review of Systems  Constitutional: Negative for activity change, appetite change, fatigue, fever and unexpected weight change.  Respiratory: Negative for cough and shortness of breath.   Cardiovascular: Negative for chest pain.  Gastrointestinal: Negative for abdominal pain.  Neurological: Negative for weakness.  Psychiatric/Behavioral: Negative for behavioral problems and confusion.    Vital Signs: There were no vitals taken for this visit.  Physical Exam Vitals reviewed.  HENT:     Mouth/Throat:     Mouth: Mucous membranes are moist.  Cardiovascular:     Rate and Rhythm: Normal rate and regular rhythm.     Heart sounds: Normal heart sounds.  Pulmonary:     Effort: Pulmonary effort is normal.     Breath sounds: Normal breath sounds.  Abdominal:     Palpations: Abdomen is soft.  Musculoskeletal:        General: Normal range of motion.  Skin:    General: Skin is warm.  Neurological:     Mental Status: He is alert and oriented to person, place, and time.  Psychiatric:        Behavior: Behavior normal.     Imaging: CT  CHEST ABDOMEN PELVIS W CONTRAST  Result Date: 09/08/2019 CLINICAL DATA:  Low-grade non-Hodgkin's lymphoma. EXAM: CT CHEST, ABDOMEN, AND PELVIS WITH CONTRAST TECHNIQUE: Multidetector CT imaging of the chest, abdomen and pelvis was performed following the standard protocol during bolus administration of intravenous contrast. CONTRAST:  181mL OMNIPAQUE IOHEXOL 300 MG/ML  SOLN COMPARISON:  PET-CT scan 07/30/2016 FINDINGS: CT CHEST FINDINGS Cardiovascular: No significant vascular findings. Normal heart size. No pericardial effusion. Mediastinum/Nodes: Small calcified mediastinal lymph nodes again noted. No axillary adenopathy. No mediastinal adenopathy Lungs/Pleura: Calcified granuloma in the LEFT lower lobe. No suspicious pulmonary nodularity. Musculoskeletal: No aggressive osseous lesion.  CT ABDOMEN AND PELVIS FINDINGS Hepatobiliary: No focal hepatic lesion. No biliary ductal dilatation. Gallbladder is normal. Common bile duct is normal. Pancreas: Pancreas is normal. No ductal dilatation. No pancreatic inflammation. Spleen: Normal volume spleen Adrenals/urinary tract: Adrenal glands and kidneys are normal. The ureters and bladder normal. Stomach/Bowel: Stomach, small bowel, appendix, and cecum are normal. The colon and rectosigmoid colon are normal. Vascular/Lymphatic: Mild intimal calcification abdominal aorta normal caliber. Mild periaortic retroperitoneal lymphadenopathy. For example lymph node LEFT aorta at the bifurcation measures 16 mm short axis. Larger lymph nodes in the pelvis the pelvis. For example LEFT external iliac lymph node measures 21 mm (113/2). Bulky inguinal adenopathy. Large LEFT inguinal lymph node measures 30 mm (image 133/2. Reproductive: Prostate unremarkable. Other: No free fluid. Musculoskeletal: No aggressive osseous lesion. IMPRESSION: Chest Impression: 1. No evidence of lymphoma in the thorax. 2. Benign calcified mediastinal lymph nodes. Abdomen / Pelvis Impression: 1. Bulky LEFT inguinal  adenopathy. 2. Bilateral iliac and periaortic lymphadenopathy. 3. Findings consistent lymphoma recurrence. 4. Normal volume spleen. Electronically Signed   By: Suzy Bouchard M.D.   On: 09/08/2019 12:02    Labs:  CBC: Recent Labs    03/08/19 0823 09/05/19 0808  WBC 6.5 5.3  HGB 14.0 13.6  HCT 41.6 39.5  PLT 227 211    COAGS: No results for input(s): INR, APTT in the last 8760 hours.  BMP: Recent Labs    03/08/19 0823 09/05/19 0808  NA 139 139  K 4.2 4.2  CL 103 105  CO2 29 27  GLUCOSE 117* 139*  BUN 22* 18  CALCIUM 9.4 9.4  CREATININE 0.98 0.96  GFRNONAA >60 >60  GFRAA >60 >60    LIVER FUNCTION TESTS: Recent Labs    03/08/19 0823 09/05/19 0808  BILITOT 0.5 0.5  AST 11* 21  ALT 17 19  ALKPHOS 102 103  PROT 7.1 6.9  ALBUMIN 4.4 4.5    TUMOR MARKERS: No results for input(s): AFPTM, CEA, CA199, CHROMGRNA in the last 8760 hours.  Assessment and Plan:  Hx low grade Non Hodgkin's Lymphoma Left inguinal Lymph node enlargement post vasectomy in June 2021 Dr Marin Olp requesting biopsy of same Risks and benefits of left inguinal LN bx was discussed with the patient and/or patient's family including, but not limited to bleeding, infection, damage to adjacent structures or low yield requiring additional tests.  All of the questions were answered and there is agreement to proceed.  Consent signed and in chart.   Thank you for this interesting consult.  I greatly enjoyed meeting James Jennings and look forward to participating in their care.  A copy of this report was sent to the requesting provider on this date.  Electronically Signed: Lavonia Drafts, PA-C 09/13/2019, 11:49 AM   I spent a total of  30 Minutes   in face to face in clinical consultation, greater than 50% of which was counseling/coordinating care for left inguinal LN bx

## 2019-09-13 NOTE — Procedures (Signed)
Interventional Radiology Procedure Note  Procedure: US Guided Biopsy of left inguinal lymph node  Complications: None  Estimated Blood Loss: < 10 mL  Findings: 25 G core biopsy of left inguinal node performed under US guidance.  Five core samples obtained and sent to Pathology.  Venetia Night. Kathlene Cote, M.D Pager:  [phone]

## 2019-09-14 ENCOUNTER — Other Ambulatory Visit: Payer: Self-pay

## 2019-09-15 LAB — SURGICAL PATHOLOGY

## 2019-09-19 ENCOUNTER — Telehealth: Payer: Self-pay | Admitting: *Deleted

## 2019-09-19 ENCOUNTER — Other Ambulatory Visit: Payer: Self-pay | Admitting: Hematology & Oncology

## 2019-09-19 DIAGNOSIS — C8223 Follicular lymphoma grade III, unspecified, intra-abdominal lymph nodes: Secondary | ICD-10-CM

## 2019-09-19 NOTE — Telephone Encounter (Signed)
Received a phone call from patient stating that he was expecting a phone call from Dr Marin Olp yesterday but didn't get one.  Dr Marin Olp states he will call patient today.  Patient informed.

## 2019-09-27 ENCOUNTER — Other Ambulatory Visit: Payer: Self-pay

## 2019-09-27 ENCOUNTER — Ambulatory Visit (HOSPITAL_COMMUNITY)
Admission: RE | Admit: 2019-09-27 | Discharge: 2019-09-27 | Disposition: A | Discharge status: Home / Self Care | Payer: 59 | Source: Ambulatory Visit | Attending: Hematology & Oncology | Admitting: Hematology & Oncology

## 2019-09-27 DIAGNOSIS — C8223 Follicular lymphoma grade III, unspecified, intra-abdominal lymph nodes: Secondary | ICD-10-CM | POA: Insufficient documentation

## 2019-09-27 LAB — GLUCOSE, CAPILLARY: Glucose-Capillary: 157 mg/dL — ABNORMAL HIGH (ref 70–99)

## 2019-09-27 MED ORDER — FLUDEOXYGLUCOSE F - 18 (FDG) INJECTION: 13.7000 | Freq: Once | INTRAVENOUS | Status: DC | PRN

## 2019-09-28 ENCOUNTER — Inpatient Hospital Stay (HOSPITAL_BASED_OUTPATIENT_CLINIC_OR_DEPARTMENT_OTHER): Payer: 59 | Admitting: Hematology & Oncology

## 2019-09-28 ENCOUNTER — Telehealth: Payer: Self-pay | Admitting: Hematology & Oncology

## 2019-09-28 VITALS — BP 140/80 | HR 90 | Temp 98.6°F | Resp 18 | Wt 275.0 lb

## 2019-09-28 DIAGNOSIS — C8223 Follicular lymphoma grade III, unspecified, intra-abdominal lymph nodes: Secondary | ICD-10-CM | POA: Diagnosis not present

## 2019-09-28 MED ORDER — ZANUBRUTINIB 80 MG PO CAPS: 160.0000 mg | ORAL_CAPSULE | Freq: Two times a day (BID) | ORAL | 2 refills | Status: DC

## 2019-09-28 NOTE — Telephone Encounter (Signed)
Appointments scheduled and patient requested that he would get appt info from May Chart so I did not print an appt schedule per 8/26 los

## 2019-09-28 NOTE — Progress Notes (Signed)
Hematology and Oncology Follow Up Visit  James Jennings [id number] 28-Jun-1970 49 y.o. 09/28/2019   Principle Diagnosis:  Follicular B- cell non-Hodgkin's lymphoma - Relapsed  Past Therapy:   Rituxan/bendamustine-s/p cycle 6 - completed on 07/03/2016  Current Therapy: Brukinsa (zanubrutinib) 160 mg po BID   Interim History:  James Jennings is here today for follow-up.  Unfortunately, we have documented recurrent disease.  When I last saw him, he had noted a lymph node in the left groin area.  He we then did a CT scan on him which confirmed that he had a large lymph node in that area.  He then underwent a biopsy.  This was done by radiology.  The pathology report ([id number]) showed atypical lymphoid perforation which was similar to what he had previously.  It was CD20 positive.  He was felt to be consistent with his follicular lymphoma.  He had a PET scan done.  This was done on 09/27/2019.  This showed marked activity in the retroperitoneal, pelvic and inguinal lymph nodes.  There was no hypermetabolic lymph nodes noted elsewhere.  He is doing well.  He has had no complaints.  Unfortunate, his wife cannot be with this today as she was working.  I think that would be a reasonable option for him with recurrent disease would be the new [place] tyrosine kinase inhibitor-Brukinsa.  This has been shown to be quite active.  It also is less toxicity than Imbruvica.  James Jennings does have some cardiac risk factors.  I would like to try to avoid any agent that would increase his risk of cardiac toxicity.  Given that we does have 1 area of disease for the most part, I think would be worthwhile to try the [place] and I feel that we should get a response.  I think toxicity would be reasonable.  There is a risk of high blood pressure.  We will have to watch out for cytopenias.  There is an increased risk of elevated liver tests.  The dose of Brukinsa would be 160 mg p.o. twice daily.  He has stopped smoking.   This will definitely help him out.  There is been no problems with bowels or bladder.  He has had no abdominal pain.  He has had no cough or shortness of breath.  Overall, I would say his performance status is ECOG 0.    Medications:  Allergies as of 09/28/2019      Reactions   Mango Flavor Swelling, Other (See Comments)   LIPS SWELL   Shellfish Allergy Anaphylaxis   Lactose Intolerance (gi) Diarrhea      Medication List       Accurate as of September 28, 2019  5:34 PM. If you have any questions, ask your nurse or doctor.        acetaminophen 325 MG tablet Commonly known as: TYLENOL Take 650 mg by mouth every 6 (six) hours as needed for moderate pain or headache.   atorvastatin 80 MG tablet Commonly known as: LIPITOR Take 80 mg by mouth daily.   cetirizine 10 MG tablet Commonly known as: ZYRTEC Take 10 mg by mouth daily as needed for allergies.   ibuprofen 200 MG tablet Commonly known as: ADVIL Take 600 mg by mouth at bedtime.   metFORMIN 500 MG tablet Commonly known as: GLUCOPHAGE Take 1 tablet (500 mg total) by mouth daily with breakfast.   multivitamin Tabs tablet Take 1 tablet by mouth daily.   Zanubrutinib 80 MG Caps Take 160 mg  by mouth 2 (two) times daily. Started by: [name], MD       Allergies:  Allergies  Allergen Reactions  . Mango Flavor Swelling and Other (See Comments)    LIPS SWELL  . Shellfish Allergy Anaphylaxis  . Lactose Intolerance (Gi) Diarrhea    Past Medical History, Surgical history, Social history, and Family History were reviewed and updated.  Review of Systems: Review of Systems  Constitutional: Negative.   HENT: Negative.   Eyes: Negative.   Respiratory: Negative.   Cardiovascular: Negative.   Gastrointestinal: Negative.   Genitourinary: Negative.   Musculoskeletal: Negative.   Skin: Negative.   Neurological: Negative.   Endo/Heme/Allergies: Negative.   Psychiatric/Behavioral: Negative.       Physical  Exam:  weight is 275 lb (124.7 kg). His oral temperature is 98.6 F (37 C). His blood pressure is 140/80 and his pulse is 90. His respiration is 18 and oxygen saturation is 100%.   Wt Readings from Last 3 Encounters:  09/28/19 275 lb (124.7 kg)  09/13/19 275 lb 6.4 oz (124.9 kg)  09/05/19 276 lb (125.2 kg)    Physical Exam Vitals reviewed.  HENT:     Head: Normocephalic and atraumatic.  Eyes:     Pupils: Pupils are equal, round, and reactive to light.  Cardiovascular:     Rate and Rhythm: Normal rate and regular rhythm.     Heart sounds: Normal heart sounds.  Pulmonary:     Effort: Pulmonary effort is normal.     Breath sounds: Normal breath sounds.  Abdominal:     General: Bowel sounds are normal.     Palpations: Abdomen is soft.     Comments: Soft abdomen.  He is somewhat obese.  He does have a palpable lymph node in the left inguinal area.  This point measures about 2 cm.  It is somewhat firm and nonmobile.  There is no palpable liver or spleen tip.  Musculoskeletal:        General: No tenderness or deformity. Normal range of motion.     Cervical back: Normal range of motion.  Lymphadenopathy:     Cervical: No cervical adenopathy.  Skin:    General: Skin is warm and dry.     Findings: No erythema or rash.  Neurological:     Mental Status: He is alert and oriented to person, place, and time.  Psychiatric:        Behavior: Behavior normal.        Thought Content: Thought content normal.        Judgment: Judgment normal.       Lab Results  Component Value Date   WBC 5.3 09/05/2019   HGB 13.6 09/05/2019   HCT 39.5 09/05/2019   MCV 90.2 09/05/2019   PLT 211 09/05/2019   No results found for: FERRITIN, IRON, TIBC, UIBC, IRONPCTSAT Lab Results  Component Value Date   RETICCTPCT 1.07 12/06/2015   RBC 4.38 09/05/2019   RETICCTABS 48.79 12/06/2015   No results found for: Nils Pyle [facility] Lab Results  Component Value Date   IGGSERUM 785  06/04/2016   IGMSERUM 52 06/04/2016   No results found for: Odetta Pink, SPEI   Chemistry      Component Value Date/Time   NA 139 09/05/2019 0808   NA 143 08/10/2018 1604   NA 141 12/03/2016 1155   NA 141 02/07/2016 1149   K 4.2 09/05/2019 0808   K 3.8 12/03/2016 1155  K 4.3 02/07/2016 1149   CL 105 09/05/2019 0808   CL 100 12/03/2016 1155   CO2 27 09/05/2019 0808   CO2 27 12/03/2016 1155   CO2 27 02/07/2016 1149   BUN 18 09/05/2019 0808   BUN 12 08/10/2018 1604   BUN 10 12/03/2016 1155   BUN 10.9 02/07/2016 1149   CREATININE 0.96 09/05/2019 0808   CREATININE 1.0 12/03/2016 1155   CREATININE 0.9 02/07/2016 1149      Component Value Date/Time   CALCIUM 9.4 09/05/2019 0808   CALCIUM 9.1 12/03/2016 1155   CALCIUM 9.6 02/07/2016 1149   ALKPHOS 103 09/05/2019 0808   ALKPHOS 99 (H) 12/03/2016 1155   ALKPHOS 127 02/07/2016 1149   AST 21 09/05/2019 0808   AST 17 02/07/2016 1149   ALT 19 09/05/2019 0808   ALT 24 12/03/2016 1155   ALT 16 02/07/2016 1149   BILITOT 0.5 09/05/2019 0808   BILITOT 0.42 02/07/2016 1149       Impression and Plan: James Jennings a pleasant 49 yo caucasian gentleman with history of follicular large cell non-Hodgkin's lymphoma. He completed 6 cycles of chemotherapy with bendamustine on 07/03/2016 but was not tolerant of Rituxan.  Again, we now have recurrent disease.  I will try him on the oral agent Brukinsa.  Since James Jennings travels, having no oral therapy would be easiest.  I suspect he probably will take a good week or so before he will start that medication.  I sent the prescription to [facility], our wonderful oral chemotherapy pharmacist.  She was is a great job with getting medications to our patients.  I probably would not repeat another scan until November.  I would like to have him on Brukinsa for a good 2-3 months.  I spent a good 45 minutes with him today.  We had to talk about quite a bit.   I answered all of his questions.  We will plan to get him back in another month.    [name], MD 8/26/20215:34 PM

## 2019-09-29 ENCOUNTER — Telehealth: Payer: Self-pay | Admitting: Pharmacy Technician

## 2019-09-29 NOTE — Telephone Encounter (Signed)
Oral Oncology Patient Advocate Encounter   Received notification from [employer] that prior authorization for Brukinsa is required.   PA submitted on CoverMyMeds Key [id number] Status is pending   Oral Oncology Clinic will continue to follow.  [place] Patient [place] Phone [phone] Fax [phone] 10/02/2019 10:22 AM

## 2019-10-03 NOTE — Telephone Encounter (Signed)
Oral Oncology Patient Advocate Encounter  Received notification from [employer] that the request for prior authorization for Brukinsa has been denied due to not meeting health plan criteria for Brukinsa (Dx of Mantle Cell Lymphoma; at least one prior therapy for MCL or the drug has been recognized for treatment of the cancer by NCCN and Biologics Compendium).    This encounter will continue to be updated.  [name] Phone [phone] Fax [phone] 10/03/2019 10:27 AM

## 2019-10-05 NOTE — Telephone Encounter (Signed)
Oral Chemotherapy Pharmacist Encounter   MD office notified of the [place] denial and provided with a copy of the denial letter with the information on how to proceed with an appeal.  Darl Pikes, PharmD, BCPS, BCOP, CPP Hematology/Oncology Clinical Pharmacist [facility]/HP/AP [facility] [phone]  10/05/2019 1:14 PM

## 2019-10-17 ENCOUNTER — Telehealth: Payer: Self-pay | Admitting: *Deleted

## 2019-10-17 NOTE — Telephone Encounter (Signed)
Message received from patient stating that he tested positive for Covid-19 on Tuesday, 10/10/19 and would like to know what is next for him regarding treatment.  Dr. Marin Olp notified.  Call placed back to patient to let him know that Dr. Marin Olp has submitted a letter of medical necessity d/t Brukinsa has been denied by his insurance company. Pt appreciative of call back and has no further questions at this time.

## 2019-10-31 ENCOUNTER — Other Ambulatory Visit: Payer: Self-pay

## 2019-10-31 ENCOUNTER — Ambulatory Visit: Payer: 59 | Admitting: Family

## 2019-10-31 ENCOUNTER — Encounter: Payer: Self-pay | Admitting: Hematology & Oncology

## 2019-10-31 ENCOUNTER — Other Ambulatory Visit (HOSPITAL_BASED_OUTPATIENT_CLINIC_OR_DEPARTMENT_OTHER): Payer: Self-pay | Admitting: Internal Medicine

## 2019-10-31 ENCOUNTER — Ambulatory Visit: Payer: 59 | Attending: Internal Medicine

## 2019-10-31 ENCOUNTER — Other Ambulatory Visit: Payer: 59

## 2019-10-31 ENCOUNTER — Inpatient Hospital Stay (HOSPITAL_BASED_OUTPATIENT_CLINIC_OR_DEPARTMENT_OTHER): Payer: 59 | Admitting: Hematology & Oncology

## 2019-10-31 ENCOUNTER — Inpatient Hospital Stay: Payer: 59 | Attending: Hematology & Oncology

## 2019-10-31 VITALS — BP 135/86 | HR 79 | Temp 99.0°F | Resp 18 | Wt 279.0 lb

## 2019-10-31 DIAGNOSIS — Z7984 Long term (current) use of oral hypoglycemic drugs: Secondary | ICD-10-CM | POA: Insufficient documentation

## 2019-10-31 DIAGNOSIS — C8223 Follicular lymphoma grade III, unspecified, intra-abdominal lymph nodes: Secondary | ICD-10-CM

## 2019-10-31 DIAGNOSIS — Z9221 Personal history of antineoplastic chemotherapy: Secondary | ICD-10-CM | POA: Diagnosis not present

## 2019-10-31 DIAGNOSIS — Z79899 Other long term (current) drug therapy: Secondary | ICD-10-CM | POA: Diagnosis not present

## 2019-10-31 DIAGNOSIS — Z23 Encounter for immunization: Secondary | ICD-10-CM

## 2019-10-31 LAB — CBC WITH DIFFERENTIAL (CANCER CENTER ONLY)
Abs Immature Granulocytes: 0.01 10*3/uL (ref 0.00–0.07)
Basophils Absolute: 0 10*3/uL (ref 0.0–0.1)
Basophils Relative: 1 %
Eosinophils Absolute: 0.2 10*3/uL (ref 0.0–0.5)
Eosinophils Relative: 3 %
HCT: 39.2 % (ref 39.0–52.0)
Hemoglobin: 13.6 g/dL (ref 13.0–17.0)
Immature Granulocytes: 0 %
Lymphocytes Relative: 20 %
Lymphs Abs: 1.1 10*3/uL (ref 0.7–4.0)
MCH: 31.2 pg (ref 26.0–34.0)
MCHC: 34.7 g/dL (ref 30.0–36.0)
MCV: 89.9 fL (ref 80.0–100.0)
Monocytes Absolute: 0.6 10*3/uL (ref 0.1–1.0)
Monocytes Relative: 11 %
Neutro Abs: 3.4 10*3/uL (ref 1.7–7.7)
Neutrophils Relative %: 65 %
Platelet Count: 210 10*3/uL (ref 150–400)
RBC: 4.36 MIL/uL (ref 4.22–5.81)
RDW: 12.3 % (ref 11.5–15.5)
WBC Count: 5.3 10*3/uL (ref 4.0–10.5)
nRBC: 0 % (ref 0.0–0.2)

## 2019-10-31 LAB — HEPATITIS PANEL, ACUTE
HCV Ab: NONREACTIVE
Hep A IgM: NONREACTIVE
Hep B C IgM: NONREACTIVE
Hepatitis B Surface Ag: NONREACTIVE

## 2019-10-31 LAB — CMP (CANCER CENTER ONLY)
ALT: 17 U/L (ref 0–44)
AST: 17 U/L (ref 15–41)
Albumin: 4.3 g/dL (ref 3.5–5.0)
Alkaline Phosphatase: 95 U/L (ref 38–126)
Anion gap: 6 (ref 5–15)
BUN: 15 mg/dL (ref 6–20)
CO2: 30 mmol/L (ref 22–32)
Calcium: 9.6 mg/dL (ref 8.9–10.3)
Chloride: 104 mmol/L (ref 98–111)
Creatinine: 1.02 mg/dL (ref 0.61–1.24)
GFR, Est AFR Am: >60 mL/min (ref >60)
GFR, Estimated: >60 mL/min (ref >60)
Glucose, Bld: 129 mg/dL — ABNORMAL HIGH (ref 70–99)
Potassium: 4.3 mmol/L (ref 3.5–5.1)
Sodium: 140 mmol/L (ref 135–145)
Total Bilirubin: 0.7 mg/dL (ref 0.3–1.2)
Total Protein: 6.9 g/dL (ref 6.5–8.1)

## 2019-10-31 LAB — URIC ACID: Uric Acid, Serum: 5.7 mg/dL (ref 3.7–8.6)

## 2019-10-31 LAB — LACTATE DEHYDROGENASE: LDH: 163 U/L (ref 98–192)

## 2019-10-31 MED ORDER — MONTELUKAST SODIUM 10 MG PO TABS: 10.0000 mg | ORAL_TABLET | Freq: Every day | ORAL | 1 refills | Status: DC

## 2019-10-31 NOTE — Progress Notes (Signed)
Hematology and Oncology Follow Up Visit  James Jennings [id number] [birth year] 49 y.o. 10/31/2019   Principle Diagnosis:  Follicular B- cell non-Hodgkin's lymphoma - Relapsed  Past Therapy:   Rituxan/bendamustine-s/p cycle 6 - completed on 07/03/2016  Current Therapy: Gazyva  - Cytoxan/ Vincristine/Prednisone  - start cycle #1 on 11/07/2019   Interim History:  James Jennings is here today for follow-up.  He just does not look like his insurance company will allow [place] to use zanubrutinib.  I really cannot wait much longer to try to get them to approve this.  I think a reasonable option would be IV chemotherapy.  I will try him on Gazyva along with Cytoxan/vincristine/prednisone.  I realize that he had a horrible reaction to the Rituxan.  Hopefully, with Gazyva, we will not have this problem.  We will go ahead and put him on some Singulair (10 mg p.o. daily) to start 3 days before treatment.  We will need to have a Port-A-Cath put in.  I just worry that with his peripheral veins, there might be an issue with vincristine being pushed.  I think that we can get a very good response with the Gazyva/CVP protocol.  I think the response rate should be over 90%.  He is still doing well.  He still working from home.  He has had no problems with nausea or vomiting.  There has been no issues with pain.  He is working out.  He wants to try to lose a little bit of weight.  Currently, he is worried about his wife.  She apparently was just diagnosed with DCIS.  She is going to have a lumpectomy on October 15.  We need to make sure that he is going to be healthy to help her out.  Overall, his performance status is ECOG 0.  .   Medications:  Allergies as of 10/31/2019      Reactions   Mango Flavor Swelling, Other (See Comments)   LIPS SWELL   Shellfish Allergy Anaphylaxis   Lactose Intolerance (gi) Diarrhea      Medication List       Accurate as of October 31, 2019 10:57 AM. If you have any questions,  ask your nurse or doctor.        STOP taking these medications   Zanubrutinib 80 MG Caps Stopped by: [name], MD     TAKE these medications   acetaminophen 325 MG tablet Commonly known as: TYLENOL Take 650 mg by mouth every 6 (six) hours as needed for moderate pain or headache.   atorvastatin 80 MG tablet Commonly known as: LIPITOR Take 80 mg by mouth daily.   cetirizine 10 MG tablet Commonly known as: ZYRTEC Take 10 mg by mouth daily as needed for allergies.   ibuprofen 200 MG tablet Commonly known as: ADVIL Take 600 mg by mouth at bedtime.   metFORMIN 500 MG tablet Commonly known as: GLUCOPHAGE Take 1 tablet (500 mg total) by mouth daily with breakfast.   montelukast 10 MG tablet Commonly known as: Singulair Take 1 tablet (10 mg total) by mouth at bedtime. Start 3 days BEFORE chemo and take for 30 days Started by: [name], MD   multivitamin Tabs tablet Take 1 tablet by mouth daily.       Allergies:  Allergies  Allergen Reactions  . Mango Flavor Swelling and Other (See Comments)    LIPS SWELL  . Shellfish Allergy Anaphylaxis  . Lactose Intolerance (Gi) Diarrhea    Past  Medical History, Surgical history, Social history, and Family History were reviewed and updated.  Review of Systems: Review of Systems  Constitutional: Negative.   HENT: Negative.   Eyes: Negative.   Respiratory: Negative.   Cardiovascular: Negative.   Gastrointestinal: Negative.   Genitourinary: Negative.   Musculoskeletal: Negative.   Skin: Negative.   Neurological: Negative.   Endo/Heme/Allergies: Negative.   Psychiatric/Behavioral: Negative.       Physical Exam:  weight is 279 lb (126.6 kg). His oral temperature is 99 F (37.2 C). His blood pressure is 135/86 and his pulse is 79. His respiration is 18 and oxygen saturation is 99%.   Wt Readings from Last 3 Encounters:  10/31/19 279 lb (126.6 kg)  09/28/19 275 lb (124.7 kg)  09/13/19 275 lb 6.4 oz (124.9  kg)    Physical Exam Vitals reviewed.  HENT:     Head: Normocephalic and atraumatic.  Eyes:     Pupils: Pupils are equal, round, and reactive to light.  Cardiovascular:     Rate and Rhythm: Normal rate and regular rhythm.     Heart sounds: Normal heart sounds.  Pulmonary:     Effort: Pulmonary effort is normal.     Breath sounds: Normal breath sounds.  Abdominal:     General: Bowel sounds are normal.     Palpations: Abdomen is soft.     Comments: Soft abdomen.  He is somewhat obese.  He does have a palpable lymph node in the left inguinal area.  This point measures about 2 cm.  It is somewhat firm and nonmobile.  There is no palpable liver or spleen tip.  Musculoskeletal:        General: No tenderness or deformity. Normal range of motion.     Cervical back: Normal range of motion.  Lymphadenopathy:     Cervical: No cervical adenopathy.  Skin:    General: Skin is warm and dry.     Findings: No erythema or rash.  Neurological:     Mental Status: He is alert and oriented to person, place, and time.  Psychiatric:        Behavior: Behavior normal.        Thought Content: Thought content normal.        Judgment: Judgment normal.       Lab Results  Component Value Date   WBC 5.3 10/31/2019   HGB 13.6 10/31/2019   HCT 39.2 10/31/2019   MCV 89.9 10/31/2019   PLT 210 10/31/2019   No results found for: FERRITIN, IRON, TIBC, UIBC, IRONPCTSAT Lab Results  Component Value Date   RETICCTPCT 1.07 12/06/2015   RBC 4.36 10/31/2019   RETICCTABS 48.79 12/06/2015   No results found for: Nils Pyle [facility] Lab Results  Component Value Date   IGGSERUM 785 06/04/2016   IGMSERUM 52 06/04/2016   No results found for: Odetta Pink, SPEI   Chemistry      Component Value Date/Time   NA 140 10/31/2019 0850   NA 143 08/10/2018 1604   NA 141 12/03/2016 1155   NA 141 02/07/2016 1149   K 4.3 10/31/2019 0850   K  3.8 12/03/2016 1155   K 4.3 02/07/2016 1149   CL 104 10/31/2019 0850   CL 100 12/03/2016 1155   CO2 30 10/31/2019 0850   CO2 27 12/03/2016 1155   CO2 27 02/07/2016 1149   BUN 15 10/31/2019 0850   BUN 12 08/10/2018 1604   BUN 10 12/03/2016  1155   BUN 10.9 02/07/2016 1149   CREATININE 1.02 10/31/2019 0850   CREATININE 1.0 12/03/2016 1155   CREATININE 0.9 02/07/2016 1149      Component Value Date/Time   CALCIUM 9.6 10/31/2019 0850   CALCIUM 9.1 12/03/2016 1155   CALCIUM 9.6 02/07/2016 1149   ALKPHOS 95 10/31/2019 0850   ALKPHOS 99 (H) 12/03/2016 1155   ALKPHOS 127 02/07/2016 1149   AST 17 10/31/2019 0850   AST 17 02/07/2016 1149   ALT 17 10/31/2019 0850   ALT 24 12/03/2016 1155   ALT 16 02/07/2016 1149   BILITOT 0.7 10/31/2019 0850   BILITOT 0.42 02/07/2016 1149       Impression and Plan: James Jennings a pleasant 49 yo caucasian gentleman with history of follicular large cell non-Hodgkin's lymphoma. He completed 6 cycles of chemotherapy with bendamustine on 07/03/2016 but was not tolerant of Rituxan.  We will go ahead and move ahead with IV treatment.  Again I have to worry about him having a reaction with the Gazyva.  Hopefully he will not.  I will premedicate him with Singulair.  Hopefully this will help.  Again I think that he will respond very nicely to the Cytoxan/vincristine/prednisone.  We will plan for 2 additional cycles.  After the second cycle, we will then go ahead and plan for another PET scan.  The most cycles we will give would be 8.  Hopefully, we only have to give 6.  He will then need maintenance treatment if he can tolerate the Gazyva.  Given that this is relapsed, maintenance therapy I think will be critical.   We will try to get started on October 5.  We will plan treatment every 28 days.  This way, I think we can use full dose treatment and still not have to worry about his blood counts going down too much.    [name], MD 9/28/202110:57 AM

## 2019-10-31 NOTE — Progress Notes (Signed)
DISCONTINUE ON PATHWAY REGIMEN - Lymphoma and CLL     A cycle is every 28 days:     Bendamustine        Dose Mod: None     Rituximab        Dose Mod: None  **Always confirm dose/schedule in your pharmacy ordering system**  REASON: Other Reason PRIOR TREATMENT: [id number]: Bendamustine + Rituximab (90/375) q28 Days x 6 Cycles TREATMENT RESPONSE: Unable to Evaluate  START OFF PATHWAY REGIMEN - Lymphoma and CLL   [id number]:R-CVP q21 days:   A cycle is every 21 days:     Rituximab-xxxx      Cyclophosphamide      Vincristine      Prednisone   **Always confirm dose/schedule in your pharmacy ordering system**  Patient Characteristics: Follicular Lymphoma, Grades 1, 2, and 3A, Second Line, Prior Treatment with Bendamustine + Rituximab, Relapse > 24 Months Disease Type: Follicular Lymphoma, Grade 1, 2, or 3A Disease Type: Not Applicable Disease Type: Not Applicable Line of Therapy: Second Line Prior Treatment: Prior Treatment with Bendamustine + Rituximab Time to Relapse: Relapse > 24 Months Intent of Therapy: Curative Intent, Discussed with Patient

## 2019-10-31 NOTE — Progress Notes (Signed)
   Covid-19 Vaccination Clinic  Name:  Aja Bolander    MRN: [id number] DOB: 1970-02-06  10/31/2019  Mr. Muhlbauer was observed post Covid-19 immunization for 15 minutes without incident. He was provided with Vaccine Information Sheet and instruction to access the V-Safe system. Vaccinated By: Sarina Ser  Mr. Blew was instructed to call 911 with any severe reactions post vaccine: [name] Difficulty breathing  . Swelling of face and throat  . A fast heartbeat  . A bad rash all over body  . Dizziness and weakness

## 2019-11-01 ENCOUNTER — Other Ambulatory Visit: Payer: Self-pay | Admitting: Radiology

## 2019-11-01 ENCOUNTER — Telehealth: Payer: Self-pay | Admitting: Hematology & Oncology

## 2019-11-01 ENCOUNTER — Encounter: Payer: Self-pay | Admitting: Hematology & Oncology

## 2019-11-01 LAB — PROTEIN ELECTROPHORESIS, SERUM, WITH REFLEX
A/G Ratio: 1.3 (ref 0.7–1.7)
Albumin ELP: 3.7 g/dL (ref 2.9–4.4)
Alpha-1-Globulin: 0.2 g/dL (ref 0.0–0.4)
Alpha-2-Globulin: 0.7 g/dL (ref 0.4–1.0)
Beta Globulin: 1.1 g/dL (ref 0.7–1.3)
Gamma Globulin: 0.8 g/dL (ref 0.4–1.8)
Globulin, Total: 2.8 g/dL (ref 2.2–3.9)
Total Protein ELP: 6.5 g/dL (ref 6.0–8.5)

## 2019-11-01 NOTE — Progress Notes (Signed)
Pharmacist Chemotherapy Monitoring - Initial Assessment    Anticipated start date: 11/07/19          Regimen:  . Are orders appropriate based on the patient's diagnosis, regimen, and cycle? Yes . Does the plan date match the patient's scheduled date? Yes . Is the sequencing of drugs appropriate? Yes . Are the premedications appropriate for the patient's regimen? Yes . Prior Authorization for treatment is: Not Started o If applicable, is the correct biosimilar selected based on the patient's insurance? not applicable  Organ Function and Labs: [name] Are dose adjustments needed based on the patient's renal function, hepatic function, or hematologic function? No . Are appropriate labs ordered prior to the start of patient's treatment? Yes . Other organ system assessment, if indicated: N/A . The following baseline labs, if indicated, have been ordered: obinutuzumab: baseline Hepatitis B labs  Dose Assessment: . Are the drug doses appropriate? Yes . Are the following correct: o Drug concentrations Yes o IV fluid compatible with drug Yes o Administration routes Yes o Timing of therapy Yes . If applicable, does the patient have documented access for treatment and/or plans for port-a-cath placement? yes . If applicable, have lifetime cumulative doses been properly documented and assessed? not applicable Lifetime Dose Tracking  No doses have been documented on this patient for the following tracked chemicals: Doxorubicin, Epirubicin, Idarubicin, Daunorubicin, Mitoxantrone, Bleomycin, Oxaliplatin, Carboplatin, Liposomal Doxorubicin  o   Toxicity Monitoring/Prevention: . The patient has the following take home antiemetics prescribed: Ondansetron, Prochlorperazine, Dexamethasone and Lorazepam . The patient has the following take home medications prescribed: N/A . Medication allergies and previous infusion related reactions, if applicable, have been reviewed and addressed. Yes . The patient's current  medication list has been assessed for drug-drug interactions with their chemotherapy regimen. no significant drug-drug interactions were identified on review.  Order Review: . Are the treatment plan orders signed? Yes . Is the patient scheduled to see a provider prior to their treatment? No  I verify that I have reviewed each item in the above checklist and answered each question accordingly.  James Jennings, Jacqlyn Larsen 11/01/2019 8:38 AM

## 2019-11-01 NOTE — Telephone Encounter (Signed)
LMVM for patient to call me back regarding his New Chemo treatment plan.  He has questions about dates for treatment

## 2019-11-02 ENCOUNTER — Ambulatory Visit (HOSPITAL_COMMUNITY)
Admission: RE | Admit: 2019-11-02 | Discharge: 2019-11-02 | Disposition: A | Discharge status: Home / Self Care | Payer: 59 | Source: Ambulatory Visit | Attending: Hematology & Oncology | Admitting: Hematology & Oncology

## 2019-11-02 ENCOUNTER — Other Ambulatory Visit: Payer: Self-pay | Admitting: Hematology & Oncology

## 2019-11-02 ENCOUNTER — Other Ambulatory Visit: Payer: Self-pay

## 2019-11-02 ENCOUNTER — Encounter (HOSPITAL_COMMUNITY): Payer: Self-pay

## 2019-11-02 DIAGNOSIS — G4730 Sleep apnea, unspecified: Secondary | ICD-10-CM | POA: Diagnosis not present

## 2019-11-02 DIAGNOSIS — E7800 Pure hypercholesterolemia, unspecified: Secondary | ICD-10-CM | POA: Diagnosis not present

## 2019-11-02 DIAGNOSIS — C8223 Follicular lymphoma grade III, unspecified, intra-abdominal lymph nodes: Secondary | ICD-10-CM | POA: Diagnosis not present

## 2019-11-02 DIAGNOSIS — Z79899 Other long term (current) drug therapy: Secondary | ICD-10-CM | POA: Diagnosis not present

## 2019-11-02 HISTORY — PX: IR IMAGING GUIDED PORT INSERTION: IMG5740

## 2019-11-02 LAB — CBC WITH DIFFERENTIAL/PLATELET
Abs Immature Granulocytes: 0.01 10*3/uL (ref 0.00–0.07)
Basophils Absolute: 0 10*3/uL (ref 0.0–0.1)
Basophils Relative: 0 %
Eosinophils Absolute: 0.2 10*3/uL (ref 0.0–0.5)
Eosinophils Relative: 4 %
HCT: 39.9 % (ref 39.0–52.0)
Hemoglobin: 13.8 g/dL (ref 13.0–17.0)
Immature Granulocytes: 0 %
Lymphocytes Relative: 19 %
Lymphs Abs: 1 10*3/uL (ref 0.7–4.0)
MCH: 31.5 pg (ref 26.0–34.0)
MCHC: 34.6 g/dL (ref 30.0–36.0)
MCV: 91.1 fL (ref 80.0–100.0)
Monocytes Absolute: 0.6 10*3/uL (ref 0.1–1.0)
Monocytes Relative: 11 %
Neutro Abs: 3.4 10*3/uL (ref 1.7–7.7)
Neutrophils Relative %: 66 %
Platelets: 218 10*3/uL (ref 150–400)
RBC: 4.38 MIL/uL (ref 4.22–5.81)
RDW: 12.4 % (ref 11.5–15.5)
WBC: 5.2 10*3/uL (ref 4.0–10.5)
nRBC: 0 % (ref 0.0–0.2)

## 2019-11-02 LAB — PROTIME-INR
INR: 0.9 (ref 0.8–1.2)
Prothrombin Time: 11.8 seconds (ref 11.4–15.2)

## 2019-11-02 LAB — GLUCOSE, CAPILLARY: Glucose-Capillary: 141 mg/dL — ABNORMAL HIGH (ref 70–99)

## 2019-11-02 MED ORDER — FENTANYL CITRATE (PF) 100 MCG/2ML IJ SOLN
INTRAMUSCULAR | Status: AC | PRN
  Administered 2019-11-02 (×2): 50 ug via INTRAVENOUS

## 2019-11-02 MED ORDER — MIDAZOLAM HCL 2 MG/2ML IJ SOLN
INTRAMUSCULAR | Status: AC
  Filled 2019-11-02: qty 2

## 2019-11-02 MED ORDER — LIDOCAINE-EPINEPHRINE 1 %-1:100000 IJ SOLN
INTRAMUSCULAR | Status: AC
  Administered 2019-11-02: 20 mL
  Filled 2019-11-02: qty 1

## 2019-11-02 MED ORDER — FENTANYL CITRATE (PF) 100 MCG/2ML IJ SOLN
INTRAMUSCULAR | Status: AC
  Filled 2019-11-02: qty 2

## 2019-11-02 MED ORDER — DEXTROSE 5 % IV SOLN
3.0000 g | INTRAVENOUS | Status: AC
  Administered 2019-11-02: 3 g via INTRAVENOUS
  Filled 2019-11-02: qty 3

## 2019-11-02 MED ORDER — MIDAZOLAM HCL 2 MG/2ML IJ SOLN
INTRAMUSCULAR | Status: AC | PRN
  Administered 2019-11-02 (×2): 1 mg via INTRAVENOUS

## 2019-11-02 MED ORDER — HEPARIN SOD (PORK) LOCK FLUSH 100 UNIT/ML IV SOLN
INTRAVENOUS | Status: AC
  Filled 2019-11-02: qty 5

## 2019-11-02 MED ORDER — SODIUM CHLORIDE 0.9 % IV SOLN: INTRAVENOUS | Status: DC

## 2019-11-02 NOTE — H&P (Signed)
Referring Physician(s): Ennever,Peter R  Supervising Physician: Suttle,D  Patient Status:  [facility]  Chief Complaint: "I'm getting a port a cath"   Subjective: Patient familiar to IR service  from bone marrow biopsy in 2018 and left inguinal lymph node biopsy on 09/13/2019.  He has a history of relapsing follicular B cell/non-Hodgkin's lymphoma and presents today for Port-A-Cath placement for chemotherapy.  He currently denies fever, headache, chest pain, dyspnea, cough, abdominal/back pain, nausea, vomiting or bleeding.  Additional history as below.   Past Medical History:  Diagnosis Date  . Bilateral swelling of feet   . High cholesterol   . Joint pain   . Lactose intolerance   . Lymphoma, follicular ([place]) dx'd 95/6213  . Multiple food allergies   . PONV (postoperative nausea and vomiting)   . Sleep apnea    "dx'd ~ 2008; never RX'd mask" (02/28/2016)   Past Surgical History:  Procedure Laterality Date  . ACHILLES TENDON SURGERY Left ~ 2012  . APPENDECTOMY  11/12/2015   lap appy  . CLUB FOOT RELEASE Bilateral 1971  . HERNIA REPAIR    . INGUINAL LYMPH NODE BIOPSY Right 01/20/2016   Procedure: EXCISIONAL BIOPSY OF RIGHT INGUINAL LYMPH NODE;  Surgeon: Greer Pickerel, MD;  Location: [facility];  Service: General;  Laterality: Right;  . LAPAROSCOPIC APPENDECTOMY N/A 11/12/2015   Procedure: APPENDECTOMY LAPAROSCOPIC;  Surgeon: Greer Pickerel, MD;  Location: [facility];  Service: General;  Laterality: N/A;  . SUTURE REMOVAL Left ~ 2016-2017 X 3   "had to use permanent sutures w/my achilles OR; my body rejects them at times & I have to have them surgically removed; under anesthesia"  . VASECTOMY Bilateral 07/2019  . VENTRAL HERNIA REPAIR  1994         Allergies: Mango flavor, Shellfish allergy, and Lactose intolerance (gi)  Medications: Prior to Admission medications   Medication Sig Start Date End Date Taking? Authorizing Provider  atorvastatin (LIPITOR) 80 MG tablet Take 80 mg by  mouth daily.    Yes [provider]  cetirizine (ZYRTEC) 10 MG tablet Take 10 mg by mouth daily as needed for allergies.    Yes [provider]  ibuprofen (ADVIL) 200 MG tablet Take 600 mg by mouth at bedtime.    Yes [provider]  metFORMIN (GLUCOPHAGE) 500 MG tablet Take 1 tablet (500 mg total) by mouth daily with breakfast. 10/25/18 11/02/19 Yes [name] Jones, MD  multivitamin (ONE-A-DAY MEN'S) TABS tablet Take 1 tablet by mouth daily.   Yes [provider]  acetaminophen (TYLENOL) 325 MG tablet Take 650 mg by mouth every 6 (six) hours as needed for moderate pain or headache. Patient not taking: Reported on 09/12/2019    [provider]  montelukast (SINGULAIR) 10 MG tablet Take 1 tablet (10 mg total) by mouth at bedtime. Start 3 days BEFORE chemo and take for 30 days 10/31/19   [name], MD     Vital Signs: BP (!) 159/77   Pulse 89   Temp 98.5 F (36.9 C) (Oral)   Resp 18   Ht 5' 11.75" (1.822 m)   Wt 279 lb (126.6 kg)   SpO2 96%   BMI 38.10 kg/m   Physical Exam awake, alert.  Chest clear to auscultation bilaterally.  Heart with regular rate and rhythm.  Abdomen soft, positive bowel sounds, nontender.  Bilateral pretibial edema noted, more so on the left.  Imaging: No results found.  Labs:  CBC: Recent Labs  03/08/19 0823 09/05/19 0808 10/31/19 0850 11/02/19 0758  WBC 6.5 5.3 5.3 5.2  HGB 14.0 13.6 13.6 13.8  HCT 41.6 39.5 39.2 39.9  PLT 227 211 210 218    COAGS: Recent Labs    11/02/19 0758  INR 0.9    BMP: Recent Labs    03/08/19 0823 09/05/19 0808 10/31/19 0850  NA 139 139 140  K 4.2 4.2 4.3  CL 103 105 104  CO2 29 27 30  GLUCOSE 117* 139* 129*  BUN 22* 18 15  CALCIUM 9.4 9.4 9.6  CREATININE 0.98 0.96 1.02  GFRNONAA >60 >60 >60  GFRAA >60 >60 >60    LIVER FUNCTION TESTS: Recent Labs    03/08/19 0823 09/05/19 0808 10/31/19 0850  BILITOT 0.5 0.5 0.7  AST 11* 21 17  ALT 17 19  17  ALKPHOS 102 103 95  PROT 7.1 6.9 6.9  ALBUMIN 4.4 4.5 4.3    Assessment and Plan: Pt with history of relapsing follicular B cell/non-Hodgkin's lymphoma, initially diagnosed in 2018; presents today for Port-A-Cath placement for chemotherapy.Risks and benefits of image guided port-a-catheter placement was discussed with the patient including, but not limited to bleeding, infection, pneumothorax, or fibrin sheath development and need for additional procedures.  All of the patient's questions were answered, patient is agreeable to proceed. Consent signed and in chart.     Electronically Signed: D. Kevin Allred, PA-C 11/02/2019, 8:38 AM   I spent a total of 25 minutes at the the patient's bedside AND on the patient's hospital floor or unit, greater than 50% of which was counseling/coordinating care for Port-A-Cath placement      

## 2019-11-02 NOTE — Discharge Instructions (Signed)

## 2019-11-02 NOTE — Progress Notes (Unsigned)
Pharmacist Chemotherapy Monitoring - Initial Assessment    Anticipated start date: 11/07/19  Regimen:  . Are orders appropriate based on the patient's diagnosis, regimen, and cycle? {yes/no:20286} . Does the plan date match the patient's scheduled date? {yes/no:20286} . Is the sequencing of drugs appropriate? {yes/no:20286} . Are the premedications appropriate for the patient's regimen? {yes/no:20286} . Prior Authorization for treatment is: {Rx chemo PA CNOBSJ:62836} o If applicable, is the correct biosimilar selected based on the patient's insurance? {YES/NO/NOT APPLICABLE:20182}  Organ Function and Labs: [name] Are dose adjustments needed based on the patient's renal function, hepatic function, or hematologic function? {yes/no:20286} . Are appropriate labs ordered prior to the start of patient's treatment? {yes/no:20286} . Other organ system assessment, if indicated: {Rx chemo organ:23687} . The following baseline labs, if indicated, have been ordered: {Rx chemo other labs:23685}  Dose Assessment: . Are the drug doses appropriate? {yes/no:20286} . Are the following correct: o Drug concentrations {yes/no:20286} o IV fluid compatible with drug {yes/no:20286} o Administration routes {yes/no:20286} o Timing of therapy {yes/no:20286} . If applicable, does the patient have documented access for treatment and/or plans for port-a-cath placement? {YES/NO/NOT APPLICABLE:20182} . If applicable, have lifetime cumulative doses been properly documented and assessed? {YES/NO/NOT APPLICABLE:20182} Lifetime Dose Tracking  No doses have been documented on this patient for the following tracked chemicals: Doxorubicin, Epirubicin, Idarubicin, Daunorubicin, Mitoxantrone, Bleomycin, Oxaliplatin, Carboplatin, Liposomal Doxorubicin  o   Toxicity Monitoring/Prevention: . The patient has the following take home antiemetics prescribed: {Rx chemo antiemetics:23689} . The patient has the following take home  medications prescribed: {Rx chemo toxicity:23686} . Medication allergies and previous infusion related reactions, if applicable, have been reviewed and addressed. {yes/no:20286} . The patient's current medication list has been assessed for drug-drug interactions with their chemotherapy regimen. {No/  **:31982:o} significant drug-drug interactions were identified on review.  Order Review: . Are the treatment plan orders signed? {yes/no:20286} . Is the patient scheduled to see a provider prior to their treatment? {yes/no:20286}  I verify that I have reviewed each item in the above checklist and answered each question accordingly.  Jaimey Franchini, Jacqlyn Larsen 11/02/2019 3:38 PM

## 2019-11-02 NOTE — Procedures (Signed)
Interventional Radiology Procedure Note ° °Procedure: Single Lumen Power Port Placement   ° °Access:  Right internal jugular vein ° °Findings: Catheter tip positioned at cavoatrial junction. Port is ready for immediate use.  ° °Complications: None ° °EBL: < 10 mL ° °Recommendations:  °- Ok to shower in 24 hours °- Do not submerge for 7 days °- Routine line care  ° ° °Ruchi Stoney, MD °Pager: [phone] ° ° ° °

## 2019-11-03 MED FILL — MODERNA COVID-19 VACCINE 10: 100 | 1 days supply | Qty: 1 | Fill #0

## 2019-11-06 ENCOUNTER — Other Ambulatory Visit: Payer: Self-pay

## 2019-11-06 DIAGNOSIS — C8223 Follicular lymphoma grade III, unspecified, intra-abdominal lymph nodes: Secondary | ICD-10-CM

## 2019-11-07 ENCOUNTER — Other Ambulatory Visit: Payer: Self-pay

## 2019-11-07 ENCOUNTER — Inpatient Hospital Stay: Payer: 59

## 2019-11-07 ENCOUNTER — Other Ambulatory Visit: Payer: Self-pay | Admitting: Hematology & Oncology

## 2019-11-07 ENCOUNTER — Inpatient Hospital Stay: Payer: 59 | Attending: Hematology & Oncology

## 2019-11-07 VITALS — BP 134/75 | HR 111 | Resp 18

## 2019-11-07 DIAGNOSIS — Z79899 Other long term (current) drug therapy: Secondary | ICD-10-CM | POA: Diagnosis not present

## 2019-11-07 DIAGNOSIS — Z5111 Encounter for antineoplastic chemotherapy: Secondary | ICD-10-CM | POA: Insufficient documentation

## 2019-11-07 DIAGNOSIS — E7800 Pure hypercholesterolemia, unspecified: Secondary | ICD-10-CM | POA: Diagnosis not present

## 2019-11-07 DIAGNOSIS — C8223 Follicular lymphoma grade III, unspecified, intra-abdominal lymph nodes: Secondary | ICD-10-CM

## 2019-11-07 DIAGNOSIS — Z5112 Encounter for antineoplastic immunotherapy: Secondary | ICD-10-CM | POA: Diagnosis present

## 2019-11-07 LAB — CBC WITH DIFFERENTIAL (CANCER CENTER ONLY)
Abs Immature Granulocytes: 0.01 10*3/uL (ref 0.00–0.07)
Basophils Absolute: 0 10*3/uL (ref 0.0–0.1)
Basophils Relative: 0 %
Eosinophils Absolute: 0.2 10*3/uL (ref 0.0–0.5)
Eosinophils Relative: 4 %
HCT: 38.9 % — ABNORMAL LOW (ref 39.0–52.0)
Hemoglobin: 13.1 g/dL (ref 13.0–17.0)
Immature Granulocytes: 0 %
Lymphocytes Relative: 19 %
Lymphs Abs: 0.9 10*3/uL (ref 0.7–4.0)
MCH: 30.8 pg (ref 26.0–34.0)
MCHC: 33.7 g/dL (ref 30.0–36.0)
MCV: 91.3 fL (ref 80.0–100.0)
Monocytes Absolute: 0.6 10*3/uL (ref 0.1–1.0)
Monocytes Relative: 12 %
Neutro Abs: 3.1 10*3/uL (ref 1.7–7.7)
Neutrophils Relative %: 65 %
Platelet Count: 189 10*3/uL (ref 150–400)
RBC: 4.26 MIL/uL (ref 4.22–5.81)
RDW: 12.3 % (ref 11.5–15.5)
WBC Count: 4.8 10*3/uL (ref 4.0–10.5)
nRBC: 0 % (ref 0.0–0.2)

## 2019-11-07 LAB — CMP (CANCER CENTER ONLY)
ALT: 14 U/L (ref 0–44)
AST: 15 U/L (ref 15–41)
Albumin: 4.1 g/dL (ref 3.5–5.0)
Alkaline Phosphatase: 106 U/L (ref 38–126)
Anion gap: 5 (ref 5–15)
BUN: 17 mg/dL (ref 6–20)
CO2: 30 mmol/L (ref 22–32)
Calcium: 9.5 mg/dL (ref 8.9–10.3)
Chloride: 104 mmol/L (ref 98–111)
Creatinine: 0.97 mg/dL (ref 0.61–1.24)
GFR, Estimated: >60 mL/min (ref >60)
Glucose, Bld: 143 mg/dL — ABNORMAL HIGH (ref 70–99)
Potassium: 4.2 mmol/L (ref 3.5–5.1)
Sodium: 139 mmol/L (ref 135–145)
Total Bilirubin: 0.6 mg/dL (ref 0.3–1.2)
Total Protein: 6.6 g/dL (ref 6.5–8.1)

## 2019-11-07 MED ORDER — SODIUM CHLORIDE 0.9 % IV SOLN
20.0000 mg | Freq: Once | INTRAVENOUS | Status: AC
  Administered 2019-11-07: 20 mg via INTRAVENOUS
  Filled 2019-11-07: qty 2

## 2019-11-07 MED ORDER — SODIUM CHLORIDE 0.9 % IV SOLN
100.0000 mg | Freq: Once | INTRAVENOUS | Status: AC
  Administered 2019-11-07: 100 mg via INTRAVENOUS
  Filled 2019-11-07: qty 4

## 2019-11-07 MED ORDER — ACETAMINOPHEN 325 MG PO TABS
ORAL_TABLET | ORAL | Status: AC
  Filled 2019-11-07: qty 2

## 2019-11-07 MED ORDER — SODIUM CHLORIDE 0.9 % IV SOLN
790.0000 mg/m2 | Freq: Once | INTRAVENOUS | Status: AC
  Administered 2019-11-07: 2000 mg via INTRAVENOUS
  Filled 2019-11-07: qty 100

## 2019-11-07 MED ORDER — ACETAMINOPHEN 325 MG PO TABS: 650.0000 mg | ORAL_TABLET | Freq: Once | ORAL | Status: DC

## 2019-11-07 MED ORDER — DIPHENHYDRAMINE HCL 50 MG/ML IJ SOLN
INTRAMUSCULAR | Status: AC
  Filled 2019-11-07: qty 1

## 2019-11-07 MED ORDER — SODIUM CHLORIDE 0.9% FLUSH
10.0000 mL | INTRAVENOUS | Status: DC | PRN
  Administered 2019-11-07: 10 mL
  Filled 2019-11-07: qty 10

## 2019-11-07 MED ORDER — ONDANSETRON HCL 4 MG/2ML IJ SOLN
INTRAMUSCULAR | Status: AC
  Filled 2019-11-07: qty 8

## 2019-11-07 MED ORDER — DIPHENHYDRAMINE HCL 50 MG/ML IJ SOLN
50.0000 mg | Freq: Once | INTRAMUSCULAR | Status: AC
  Administered 2019-11-07: 50 mg via INTRAVENOUS

## 2019-11-07 MED ORDER — SODIUM CHLORIDE 0.9 % IV SOLN
Freq: Once | INTRAVENOUS | Status: AC
  Filled 2019-11-07: qty 250

## 2019-11-07 MED ORDER — ACETAMINOPHEN 325 MG PO TABS
650.0000 mg | ORAL_TABLET | Freq: Once | ORAL | Status: AC
  Administered 2019-11-07: 650 mg via ORAL

## 2019-11-07 MED ORDER — SODIUM CHLORIDE 0.9 % IV SOLN
16.0000 mg | Freq: Once | INTRAVENOUS | Status: AC
  Administered 2019-11-07: 16 mg via INTRAVENOUS
  Filled 2019-11-07: qty 8

## 2019-11-07 MED ORDER — DIPHENHYDRAMINE HCL 25 MG PO CAPS: 50.0000 mg | ORAL_CAPSULE | Freq: Once | ORAL | Status: DC

## 2019-11-07 MED ORDER — HEPARIN SOD (PORK) LOCK FLUSH 100 UNIT/ML IV SOLN
500.0000 [IU] | Freq: Once | INTRAVENOUS | Status: AC | PRN
  Administered 2019-11-07: 500 [IU]
  Filled 2019-11-07: qty 5

## 2019-11-07 MED ORDER — ONDANSETRON HCL 8 MG PO TABS
ORAL_TABLET | ORAL | Status: AC
  Filled 2019-11-07: qty 2

## 2019-11-07 MED ORDER — VINCRISTINE SULFATE CHEMO INJECTION 1 MG/ML
2.0000 mg | Freq: Once | INTRAVENOUS | Status: AC
  Administered 2019-11-07: 2 mg via INTRAVENOUS
  Filled 2019-11-07: qty 2

## 2019-11-07 MED ORDER — SODIUM CHLORIDE 0.9 % IV SOLN
10.0000 mg | Freq: Once | INTRAVENOUS | Status: AC
  Administered 2019-11-07: 10 mg via INTRAVENOUS
  Filled 2019-11-07: qty 1

## 2019-11-07 NOTE — Progress Notes (Unsigned)
Aftergiving the Benadryl UV, the pt. Stated that he feels "fuzzy". VS WNL. At 09:40 pt. Stated that he feels fine.

## 2019-11-07 NOTE — Patient Instructions (Signed)
[facility] Discharge Instructions for Patients Receiving Chemotherapy  Today you received the following chemotherapy agents Gazyva, Cytoxan, Vincristine  To help prevent nausea and vomiting after your treatment, we encourage you to take your nausea medication    If you develop nausea and vomiting that is not controlled by your nausea medication, call the clinic.   BELOW ARE SYMPTOMS THAT SHOULD BE REPORTED IMMEDIATELY:  *FEVER GREATER THAN 100.5 F  *CHILLS WITH OR WITHOUT FEVER  NAUSEA AND VOMITING THAT IS NOT CONTROLLED WITH YOUR NAUSEA MEDICATION  *UNUSUAL SHORTNESS OF BREATH  *UNUSUAL BRUISING OR BLEEDING  TENDERNESS IN MOUTH AND THROAT WITH OR WITHOUT PRESENCE OF ULCERS  *URINARY PROBLEMS  *BOWEL PROBLEMS  UNUSUAL RASH Items with * indicate a potential emergency and should be followed up as soon as possible.  Feel free to call the clinic should you have any questions or concerns. The clinic phone number is [phone].  Please show the CHEMO ALERT CARD at check-in to the Emergency Department and triage nurse.   

## 2019-11-07 NOTE — Patient Instructions (Signed)

## 2019-11-08 ENCOUNTER — Inpatient Hospital Stay: Payer: 59

## 2019-11-08 ENCOUNTER — Other Ambulatory Visit: Payer: Self-pay

## 2019-11-08 ENCOUNTER — Other Ambulatory Visit: Payer: Self-pay | Admitting: *Deleted

## 2019-11-08 VITALS — BP 116/60 | HR 90 | Temp 98.6°F | Resp 17

## 2019-11-08 DIAGNOSIS — C8223 Follicular lymphoma grade III, unspecified, intra-abdominal lymph nodes: Secondary | ICD-10-CM

## 2019-11-08 DIAGNOSIS — Z5112 Encounter for antineoplastic immunotherapy: Secondary | ICD-10-CM | POA: Diagnosis not present

## 2019-11-08 MED ORDER — DIPHENHYDRAMINE HCL 50 MG/ML IJ SOLN
50.0000 mg | Freq: Once | INTRAMUSCULAR | Status: AC
  Administered 2019-11-08: 50 mg via INTRAVENOUS

## 2019-11-08 MED ORDER — SODIUM CHLORIDE 0.9 % IV SOLN
900.0000 mg | Freq: Once | INTRAVENOUS | Status: AC
  Administered 2019-11-08: 900 mg via INTRAVENOUS
  Filled 2019-11-08: qty 36

## 2019-11-08 MED ORDER — SODIUM CHLORIDE 0.9 % IV SOLN
Freq: Once | INTRAVENOUS | Status: AC
  Filled 2019-11-08: qty 250

## 2019-11-08 MED ORDER — LIDOCAINE-PRILOCAINE 2.5-2.5 % EX CREA: 1.0000 "application " | TOPICAL_CREAM | CUTANEOUS | 0 refills | Status: DC | PRN

## 2019-11-08 MED ORDER — ACETAMINOPHEN 325 MG PO TABS
650.0000 mg | ORAL_TABLET | Freq: Once | ORAL | Status: AC
  Administered 2019-11-08: 650 mg via ORAL

## 2019-11-08 MED ORDER — SODIUM CHLORIDE 0.9 % IV SOLN
20.0000 mg | Freq: Once | INTRAVENOUS | Status: AC
  Administered 2019-11-08: 20 mg via INTRAVENOUS
  Filled 2019-11-08: qty 20

## 2019-11-08 MED ORDER — HEPARIN SOD (PORK) LOCK FLUSH 100 UNIT/ML IV SOLN
500.0000 [IU] | Freq: Once | INTRAVENOUS | Status: AC | PRN
  Administered 2019-11-08: 500 [IU]
  Filled 2019-11-08: qty 5

## 2019-11-08 MED ORDER — SODIUM CHLORIDE 0.9% FLUSH
10.0000 mL | INTRAVENOUS | Status: DC | PRN
  Administered 2019-11-08: 10 mL
  Filled 2019-11-08: qty 10

## 2019-11-08 MED ORDER — ONDANSETRON HCL 8 MG PO TABS: 8.0000 mg | ORAL_TABLET | Freq: Three times a day (TID) | ORAL | 0 refills | Status: DC | PRN

## 2019-11-08 MED ORDER — GABAPENTIN 300 MG PO CAPS: 300.0000 mg | ORAL_CAPSULE | Freq: Every day | ORAL | 1 refills | Status: DC

## 2019-11-08 MED ORDER — ACETAMINOPHEN 325 MG PO TABS
ORAL_TABLET | ORAL | Status: AC
  Filled 2019-11-08: qty 2

## 2019-11-08 MED ORDER — DIPHENHYDRAMINE HCL 50 MG/ML IJ SOLN
INTRAMUSCULAR | Status: AC
  Filled 2019-11-08: qty 1

## 2019-11-08 NOTE — Patient Instructions (Signed)
Obinutuzumab injection What is this medicine? OBINUTUZUMAB (OH bi nue TOOZ ue mab) is a monoclonal antibody. It is used to treat chronic lymphocytic leukemia (CLL) and a type of non-Hodgkin lymphoma (NHL), follicular lymphoma. This medicine may be used for other purposes; ask your health care provider or pharmacist if you have questions. COMMON BRAND NAME(S): GAZYVA What should I tell my health care provider before I take this medicine? They need to know if you have any of these conditions:  infection (especially a virus infection such as hepatitis B virus)  lung or breathing disease  heart disease  take medicines that treat or prevent blood clots  an unusual or allergic reaction to obinutuzumab, other medicines, foods, dyes, or preservatives  pregnant or trying to get pregnant  breast-feeding How should I use this medicine? This medicine is for infusion into a vein. It is given by a health care professional in a hospital or clinic setting. Talk to your pediatrician regarding the use of this medicine in children. Special care may be needed. Overdosage: If you think you have taken too much of this medicine contact a poison control center or emergency room at once. NOTE: This medicine is only for you. Do not share this medicine with others. What if I miss a dose? Keep appointments for follow-up doses as directed. It is important not to miss your dose. Call your doctor or health care professional if you are unable to keep an appointment. What may interact with this medicine?  live virus vaccines This list may not describe all possible interactions. Give your health care provider a list of all the medicines, herbs, non-prescription drugs, or dietary supplements you use. Also tell them if you smoke, drink alcohol, or use illegal drugs. Some items may interact with your medicine. What should I watch for while using this medicine? Report any side effects that you notice during your  treatment right away, such as changes in your breathing, fever, chills, dizziness or lightheadedness. These effects are more common with the first dose. Visit your prescriber or health care professional for checks on your progress. You will need to have regular blood work. Report any other side effects. The side effects of this medicine can continue after you finish your treatment. Continue your course of treatment even though you feel ill unless your doctor tells you to stop. Call your doctor or health care professional for advice if you get a fever, chills or sore throat, or other symptoms of a cold or flu. Do not treat yourself. This drug decreases your body's ability to fight infections. Try to avoid being around people who are sick. This medicine may increase your risk to bruise or bleed. Call your doctor or health care professional if you notice any unusual bleeding. Do not become pregnant while taking this medicine or for 6 months after stopping it. Women should inform their doctor if they wish to become pregnant or think they might be pregnant. There is a potential for serious side effects to an unborn child. Talk to your health care professional or pharmacist for more information. Do not breast-feed an infant while taking this medicine or for 6 months after stopping it. What side effects may I notice from receiving this medicine? Side effects that you should report to your doctor or health care professional as soon as possible:  allergic reactions like skin rash, itching or hives, swelling of the face, lips, or tongue  breathing problems  changes in vision  chest pain or chest   tightness  confusion  dizziness  loss of balance or coordination  low blood counts - this medicine may decrease the number of white blood cells, red blood cells and platelets. You may be at increased risk for infections and bleeding.  signs of decreased platelets or bleeding - bruising, pinpoint red spots on  the skin, black, tarry stools, blood in the urine  signs of infection - fever or chills, cough, sore throat, pain or trouble passing urine  signs and symptoms of liver injury like dark yellow or brown urine; general ill feeling or flu-like symptoms; light-colored stools; loss of appetite; nausea; right upper belly pain; unusually weak or tired; yellowing of the eyes or skin  trouble speaking or understanding  trouble walking  vomiting Side effects that usually do not require medical attention (report to your doctor or health care professional if they continue or are bothersome):  constipation  joint pain  muscle pain This list may not describe all possible side effects. Call your doctor for medical advice about side effects. You may report side effects to FDA at 1-800-FDA-1088. Where should I keep my medicine? This drug is only given in a hospital or clinic and will not be stored at home. NOTE: This sheet is a summary. It may not cover all possible information. If you have questions about this medicine, talk to your doctor, pharmacist, or health care provider.  2020 Elsevier/Gold Standard (2018-05-03 15:34:53)  

## 2019-11-09 ENCOUNTER — Telehealth: Payer: Self-pay | Admitting: *Deleted

## 2019-11-09 NOTE — Telephone Encounter (Signed)
Pt called lmovm Temp 99.4.Returned call to pt. Pt took tylenol 1hr ago, has had " some diarrhea", feels tired, nauseated. 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 Advised pt to recheck temp call bak if increased. Push fluids, stop working and or take a break from computer/screen time. Take Zofran for the nausea, recheck temp prior to be if elevated take tylenol. Recheck temp in AM, call office and give update on his condition.  Encouraged pt to take time out for his health and self care. Taking breaks throughout the day, rest periods, eating small healthy meals and hydrate.  Pt verbalized understanding and will call with additional feedback in the am. No further concerns.

## 2019-11-10 ENCOUNTER — Telehealth: Payer: Self-pay | Admitting: *Deleted

## 2019-11-10 NOTE — Telephone Encounter (Signed)
Message received from patient stating that he has a temperature of 100.4 this AM and is requesting a call back.  Dr. Marin Olp notified.  Call placed back to patient and patient states that he has no cough or SOB, is eating and drinking without difficulty and has no troubles with bowels or bladder.  Pt states that he is drinking 64 oz of water daily.  Pt instructed to call back if temperature reaches 101.0 per order of Dr. Marin Olp.  Pt appreciative of call back and has no further questions at this time.

## 2019-11-14 ENCOUNTER — Other Ambulatory Visit: Payer: Self-pay | Admitting: *Deleted

## 2019-11-14 ENCOUNTER — Other Ambulatory Visit: Payer: Self-pay

## 2019-11-14 ENCOUNTER — Inpatient Hospital Stay: Payer: 59

## 2019-11-14 ENCOUNTER — Inpatient Hospital Stay: Payer: 59 | Admitting: Nutrition

## 2019-11-14 VITALS — BP 111/70 | HR 82 | Resp 17

## 2019-11-14 DIAGNOSIS — C8223 Follicular lymphoma grade III, unspecified, intra-abdominal lymph nodes: Secondary | ICD-10-CM

## 2019-11-14 DIAGNOSIS — Z5112 Encounter for antineoplastic immunotherapy: Secondary | ICD-10-CM | POA: Diagnosis not present

## 2019-11-14 LAB — CBC WITH DIFFERENTIAL (CANCER CENTER ONLY)
Abs Immature Granulocytes: 0.04 10*3/uL (ref 0.00–0.07)
Basophils Absolute: 0 10*3/uL (ref 0.0–0.1)
Basophils Relative: 1 %
Eosinophils Absolute: 0.2 10*3/uL (ref 0.0–0.5)
Eosinophils Relative: 4 %
HCT: 38.6 % — ABNORMAL LOW (ref 39.0–52.0)
Hemoglobin: 13.4 g/dL (ref 13.0–17.0)
Immature Granulocytes: 1 %
Lymphocytes Relative: 14 %
Lymphs Abs: 0.6 10*3/uL — ABNORMAL LOW (ref 0.7–4.0)
MCH: 30.7 pg (ref 26.0–34.0)
MCHC: 34.7 g/dL (ref 30.0–36.0)
MCV: 88.5 fL (ref 80.0–100.0)
Monocytes Absolute: 0.3 10*3/uL (ref 0.1–1.0)
Monocytes Relative: 7 %
Neutro Abs: 3.1 10*3/uL (ref 1.7–7.7)
Neutrophils Relative %: 73 %
Platelet Count: 133 10*3/uL — ABNORMAL LOW (ref 150–400)
RBC: 4.36 MIL/uL (ref 4.22–5.81)
RDW: 11.8 % (ref 11.5–15.5)
WBC Count: 4.3 10*3/uL (ref 4.0–10.5)
nRBC: 0 % (ref 0.0–0.2)

## 2019-11-14 LAB — CMP (CANCER CENTER ONLY)
ALT: 19 U/L (ref 0–44)
AST: 14 U/L — ABNORMAL LOW (ref 15–41)
Albumin: 4.3 g/dL (ref 3.5–5.0)
Alkaline Phosphatase: 100 U/L (ref 38–126)
Anion gap: 7 (ref 5–15)
BUN: 20 mg/dL (ref 6–20)
CO2: 31 mmol/L (ref 22–32)
Calcium: 9.7 mg/dL (ref 8.9–10.3)
Chloride: 101 mmol/L (ref 98–111)
Creatinine: 1.01 mg/dL (ref 0.61–1.24)
GFR, Estimated: >60 mL/min (ref >60)
Glucose, Bld: 160 mg/dL — ABNORMAL HIGH (ref 70–99)
Potassium: 4.4 mmol/L (ref 3.5–5.1)
Sodium: 139 mmol/L (ref 135–145)
Total Bilirubin: 0.9 mg/dL (ref 0.3–1.2)
Total Protein: 6.7 g/dL (ref 6.5–8.1)

## 2019-11-14 MED ORDER — DIPHENHYDRAMINE HCL 50 MG/ML IJ SOLN
INTRAMUSCULAR | Status: AC
  Filled 2019-11-14: qty 1

## 2019-11-14 MED ORDER — SODIUM CHLORIDE 0.9 % IV SOLN
1000.0000 mg | Freq: Once | INTRAVENOUS | Status: AC
  Administered 2019-11-14: 1000 mg via INTRAVENOUS
  Filled 2019-11-14: qty 40

## 2019-11-14 MED ORDER — DIPHENHYDRAMINE HCL 50 MG/ML IJ SOLN
50.0000 mg | Freq: Once | INTRAMUSCULAR | Status: AC
  Administered 2019-11-14: 50 mg via INTRAVENOUS

## 2019-11-14 MED ORDER — PROCHLORPERAZINE MALEATE 10 MG PO TABS: 10.0000 mg | ORAL_TABLET | Freq: Four times a day (QID) | ORAL | 3 refills | Status: DC | PRN

## 2019-11-14 MED ORDER — SODIUM CHLORIDE 0.9% FLUSH
10.0000 mL | INTRAVENOUS | Status: DC | PRN
  Administered 2019-11-14: 10 mL
  Filled 2019-11-14: qty 10

## 2019-11-14 MED ORDER — ACETAMINOPHEN 325 MG PO TABS
ORAL_TABLET | ORAL | Status: AC
  Filled 2019-11-14: qty 2

## 2019-11-14 MED ORDER — SODIUM CHLORIDE 0.9 % IV SOLN
Freq: Once | INTRAVENOUS | Status: AC
  Filled 2019-11-14: qty 250

## 2019-11-14 MED ORDER — ACETAMINOPHEN 325 MG PO TABS
650.0000 mg | ORAL_TABLET | Freq: Once | ORAL | Status: AC
  Administered 2019-11-14: 650 mg via ORAL

## 2019-11-14 MED ORDER — SODIUM CHLORIDE 0.9 % IV SOLN
20.0000 mg | Freq: Once | INTRAVENOUS | Status: AC
  Administered 2019-11-14: 20 mg via INTRAVENOUS
  Filled 2019-11-14: qty 20

## 2019-11-14 MED ORDER — HEPARIN SOD (PORK) LOCK FLUSH 100 UNIT/ML IV SOLN
500.0000 [IU] | Freq: Once | INTRAVENOUS | Status: AC | PRN
  Administered 2019-11-14: 500 [IU]
  Filled 2019-11-14: qty 5

## 2019-11-14 NOTE — Patient Instructions (Signed)

## 2019-11-14 NOTE — Patient Instructions (Signed)
[facility] Discharge Instructions for Patients Receiving Chemotherapy  Today you received the following chemotherapy agents Gazyva,  To help prevent nausea and vomiting after your treatment, we encourage you to take your nausea medication    If you develop nausea and vomiting that is not controlled by your nausea medication, call the clinic.   BELOW ARE SYMPTOMS THAT SHOULD BE REPORTED IMMEDIATELY:  *FEVER GREATER THAN 100.5 F  *CHILLS WITH OR WITHOUT FEVER  NAUSEA AND VOMITING THAT IS NOT CONTROLLED WITH YOUR NAUSEA MEDICATION  *UNUSUAL SHORTNESS OF BREATH  *UNUSUAL BRUISING OR BLEEDING  TENDERNESS IN MOUTH AND THROAT WITH OR WITHOUT PRESENCE OF ULCERS  *URINARY PROBLEMS  *BOWEL PROBLEMS  UNUSUAL RASH Items with * indicate a potential emergency and should be followed up as soon as possible.  Feel free to call the clinic should you have any questions or concerns. The clinic phone number is [phone].  Please show the [place] at check-in to the Emergency Department and triage nurse.

## 2019-11-14 NOTE — Progress Notes (Signed)
49 year old male diagnosed with Relapsed Follicular B-Cell Lymphoma followed by Dr. Marin Olp. Patient receiving chemotherapy.  PMH includes hypercholesterolemia. Pre-diabetes per patient.  Medications include Glucophage, MVI, Zofran.  Labs include Glucose 143.  Height: 5\' 11"  Weight: 279 pounds on Sept 30. UBW: 250-260 pounds. BMI: 38.1 ECOG: 0  Patient's allergies noted. Reports he would like to lose weight. He reports nausea but no vomiting. He states his jaw is achy. Reports he would like to increase his physical activity and has discussed with his MD.  Nutrition Diagnosis: Food and Nutrition Related Knowledge Deficit related to lymphoma and associated treatments as evidenced by no prior need for nutrition related information.  Intervention: Educated on strategies for improving nausea. Provided fact sheets. Encouraged healthy diet but discouraged weight loss secondary to extreme calorie deficit. Safe 1-2 pounds weight loss per week acceptable with adequate protein intake. Continue activity per MD. Reviewed high protein foods. Questions answered. Teach back method used. Contact information given.  Monitoring, Evaluation, Goals: Patient will tolerate adequate calories and protein to minimize loss of lean body mass.  Next Visit: To be scheduled as needed.

## 2019-11-14 NOTE — Progress Notes (Signed)
Ok to treat pt with 10/5 labs per MD

## 2019-11-20 ENCOUNTER — Other Ambulatory Visit: Payer: Self-pay | Admitting: *Deleted

## 2019-11-20 DIAGNOSIS — C8223 Follicular lymphoma grade III, unspecified, intra-abdominal lymph nodes: Secondary | ICD-10-CM

## 2019-11-21 ENCOUNTER — Inpatient Hospital Stay: Payer: 59

## 2019-11-21 ENCOUNTER — Other Ambulatory Visit: Payer: Self-pay

## 2019-11-21 VITALS — BP 127/77 | HR 71 | Temp 98.6°F | Resp 16

## 2019-11-21 DIAGNOSIS — Z5112 Encounter for antineoplastic immunotherapy: Secondary | ICD-10-CM | POA: Diagnosis not present

## 2019-11-21 DIAGNOSIS — C8223 Follicular lymphoma grade III, unspecified, intra-abdominal lymph nodes: Secondary | ICD-10-CM

## 2019-11-21 LAB — CBC WITH DIFFERENTIAL (CANCER CENTER ONLY)
Abs Immature Granulocytes: 0.02 10*3/uL (ref 0.00–0.07)
Basophils Absolute: 0 10*3/uL (ref 0.0–0.1)
Basophils Relative: 1 %
Eosinophils Absolute: 0.1 10*3/uL (ref 0.0–0.5)
Eosinophils Relative: 8 %
HCT: 36.8 % — ABNORMAL LOW (ref 39.0–52.0)
Hemoglobin: 12.7 g/dL — ABNORMAL LOW (ref 13.0–17.0)
Immature Granulocytes: 1 %
Lymphocytes Relative: 26 %
Lymphs Abs: 0.5 10*3/uL — ABNORMAL LOW (ref 0.7–4.0)
MCH: 31.4 pg (ref 26.0–34.0)
MCHC: 34.5 g/dL (ref 30.0–36.0)
MCV: 90.9 fL (ref 80.0–100.0)
Monocytes Absolute: 0.6 10*3/uL (ref 0.1–1.0)
Monocytes Relative: 32 %
Neutro Abs: 0.6 10*3/uL — ABNORMAL LOW (ref 1.7–7.7)
Neutrophils Relative %: 32 %
Platelet Count: 154 10*3/uL (ref 150–400)
RBC: 4.05 MIL/uL — ABNORMAL LOW (ref 4.22–5.81)
RDW: 12.4 % (ref 11.5–15.5)
WBC Count: 1.8 10*3/uL — ABNORMAL LOW (ref 4.0–10.5)
nRBC: 0 % (ref 0.0–0.2)

## 2019-11-21 LAB — CMP (CANCER CENTER ONLY)
ALT: 13 U/L (ref 0–44)
AST: 12 U/L — ABNORMAL LOW (ref 15–41)
Albumin: 4 g/dL (ref 3.5–5.0)
Alkaline Phosphatase: 111 U/L (ref 38–126)
Anion gap: 8 (ref 5–15)
BUN: 17 mg/dL (ref 6–20)
CO2: 28 mmol/L (ref 22–32)
Calcium: 9.3 mg/dL (ref 8.9–10.3)
Chloride: 104 mmol/L (ref 98–111)
Creatinine: 0.95 mg/dL (ref 0.61–1.24)
GFR, Estimated: >60 mL/min (ref >60)
Glucose, Bld: 188 mg/dL — ABNORMAL HIGH (ref 70–99)
Potassium: 4 mmol/L (ref 3.5–5.1)
Sodium: 140 mmol/L (ref 135–145)
Total Bilirubin: 0.6 mg/dL (ref 0.3–1.2)
Total Protein: 6.6 g/dL (ref 6.5–8.1)

## 2019-11-21 MED ORDER — DIPHENHYDRAMINE HCL 50 MG/ML IJ SOLN
12.5000 mg | Freq: Once | INTRAMUSCULAR | Status: AC
  Administered 2019-11-21: 12.5 mg via INTRAVENOUS

## 2019-11-21 MED ORDER — SODIUM CHLORIDE 0.9 % IV SOLN
1000.0000 mg | Freq: Once | INTRAVENOUS | Status: AC
  Administered 2019-11-21: 1000 mg via INTRAVENOUS
  Filled 2019-11-21: qty 40

## 2019-11-21 MED ORDER — ACETAMINOPHEN 325 MG PO TABS
650.0000 mg | ORAL_TABLET | Freq: Once | ORAL | Status: AC
  Administered 2019-11-21: 650 mg via ORAL

## 2019-11-21 MED ORDER — HEPARIN SOD (PORK) LOCK FLUSH 100 UNIT/ML IV SOLN
500.0000 [IU] | Freq: Once | INTRAVENOUS | Status: AC | PRN
  Administered 2019-11-21: 500 [IU]
  Filled 2019-11-21: qty 5

## 2019-11-21 MED ORDER — SODIUM CHLORIDE 0.9% FLUSH
10.0000 mL | INTRAVENOUS | Status: DC | PRN
  Administered 2019-11-21: 10 mL
  Filled 2019-11-21: qty 10

## 2019-11-21 MED ORDER — DIPHENHYDRAMINE HCL 50 MG/ML IJ SOLN
INTRAMUSCULAR | Status: AC
  Filled 2019-11-21: qty 1

## 2019-11-21 MED ORDER — SODIUM CHLORIDE 0.9 % IV SOLN
Freq: Once | INTRAVENOUS | Status: AC
  Filled 2019-11-21: qty 250

## 2019-11-21 MED ORDER — SODIUM CHLORIDE 0.9 % IV SOLN
20.0000 mg | Freq: Once | INTRAVENOUS | Status: AC
  Administered 2019-11-21: 20 mg via INTRAVENOUS
  Filled 2019-11-21: qty 20

## 2019-11-21 MED ORDER — SODIUM CHLORIDE 0.9 % IV SOLN
40.0000 mg | Freq: Once | INTRAVENOUS | Status: AC
  Administered 2019-11-21: 40 mg via INTRAVENOUS
  Filled 2019-11-21: qty 4

## 2019-11-21 MED ORDER — ACETAMINOPHEN 325 MG PO TABS
ORAL_TABLET | ORAL | Status: AC
  Filled 2019-11-21: qty 2

## 2019-11-21 NOTE — Progress Notes (Signed)
Reviewed all labwork with Laverna Peace NP.  Ok to treat today in spite of [place] .6

## 2019-11-21 NOTE — Patient Instructions (Signed)
Obinutuzumab injection What is this medicine? OBINUTUZUMAB (OH bi nue TOOZ ue mab) is a monoclonal antibody. It is used to treat chronic lymphocytic leukemia (CLL) and a type of non-Hodgkin lymphoma (NHL), follicular lymphoma. This medicine may be used for other purposes; ask your health care provider or pharmacist if you have questions. COMMON BRAND NAME(S): GAZYVA What should I tell my health care provider before I take this medicine? They need to know if you have any of these conditions:  infection (especially a virus infection such as hepatitis B virus)  lung or breathing disease  heart disease  take medicines that treat or prevent blood clots  an unusual or allergic reaction to obinutuzumab, other medicines, foods, dyes, or preservatives  pregnant or trying to get pregnant  breast-feeding How should I use this medicine? This medicine is for infusion into a vein. It is given by a health care professional in a hospital or clinic setting. Talk to your pediatrician regarding the use of this medicine in children. Special care may be needed. Overdosage: If you think you have taken too much of this medicine contact a poison control center or emergency room at once. NOTE: This medicine is only for you. Do not share this medicine with others. What if I miss a dose? Keep appointments for follow-up doses as directed. It is important not to miss your dose. Call your doctor or health care professional if you are unable to keep an appointment. What may interact with this medicine?  live virus vaccines This list may not describe all possible interactions. Give your health care provider a list of all the medicines, herbs, non-prescription drugs, or dietary supplements you use. Also tell them if you smoke, drink alcohol, or use illegal drugs. Some items may interact with your medicine. What should I watch for while using this medicine? Report any side effects that you notice during your  treatment right away, such as changes in your breathing, fever, chills, dizziness or lightheadedness. These effects are more common with the first dose. Visit your prescriber or health care professional for checks on your progress. You will need to have regular blood work. Report any other side effects. The side effects of this medicine can continue after you finish your treatment. Continue your course of treatment even though you feel ill unless your doctor tells you to stop. Call your doctor or health care professional for advice if you get a fever, chills or sore throat, or other symptoms of a cold or flu. Do not treat yourself. This drug decreases your body's ability to fight infections. Try to avoid being around people who are sick. This medicine may increase your risk to bruise or bleed. Call your doctor or health care professional if you notice any unusual bleeding. Do not become pregnant while taking this medicine or for 6 months after stopping it. Women should inform their doctor if they wish to become pregnant or think they might be pregnant. There is a potential for serious side effects to an unborn child. Talk to your health care professional or pharmacist for more information. Do not breast-feed an infant while taking this medicine or for 6 months after stopping it. What side effects may I notice from receiving this medicine? Side effects that you should report to your doctor or health care professional as soon as possible:  allergic reactions like skin rash, itching or hives, swelling of the face, lips, or tongue  breathing problems  changes in vision  chest pain or chest  tightness  confusion  dizziness  loss of balance or coordination  low blood counts - this medicine may decrease the number of white blood cells, red blood cells and platelets. You may be at increased risk for infections and bleeding.  signs of decreased platelets or bleeding - bruising, pinpoint red spots on  the skin, black, tarry stools, blood in the urine  signs of infection - fever or chills, cough, sore throat, pain or trouble passing urine  signs and symptoms of liver injury like dark yellow or brown urine; general ill feeling or flu-like symptoms; light-colored stools; loss of appetite; nausea; right upper belly pain; unusually weak or tired; yellowing of the eyes or skin  trouble speaking or understanding  trouble walking  vomiting Side effects that usually do not require medical attention (report to your doctor or health care professional if they continue or are bothersome):  constipation  joint pain  muscle pain This list may not describe all possible side effects. Call your doctor for medical advice about side effects. You may report side effects to FDA at 1-800-FDA-1088. Where should I keep my medicine? This drug is only given in a hospital or clinic and will not be stored at home. NOTE: This sheet is a summary. It may not cover all possible information. If you have questions about this medicine, talk to your doctor, pharmacist, or health care provider.  2020 Elsevier/Gold Standard (2018-05-03 15:34:53) Neutropenia Neutropenia is a condition that occurs when you have a lower-than-normal level of a type of white blood cell (neutrophil) in your body. Neutrophils are made in the spongy center of large bones (bone marrow), and they fight infections. Neutrophils are your body's main defense against bacterial and fungal infections. The fewer neutrophils you have and the longer your body remains without them, the greater your risk of getting a severe infection. What are the causes? This condition can occur if your body uses up or destroys neutrophils faster than your bone marrow can make them. Neutropenia may be caused by:  A bacterial or fungal infection.  Allergic disorders.  Reactions to some medicines.  An autoimmune disease.  An enlarged spleen. This condition can also  occur if your bone marrow does not produce enough neutrophils. This problem may be caused by:  Cancer.  Cancer treatments, such as radiation or chemotherapy.  Viral infections.  Medicines, such as phenytoin.  Vitamin B12 deficiency.  Diseases of the bone marrow.  Environmental toxins, such as insecticides. What are the signs or symptoms? This condition does not usually cause symptoms. If symptoms are present, they are usually caused by an underlying infection. Symptoms of an infection may include:  Fever.  Chills.  Swollen glands.  Oral or anal ulcers.  Cough and shortness of breath.  Rash.  Skin infection.  Fatigue. How is this diagnosed? Your health care provider may suspect neutropenia if you have:  A condition that may cause neutropenia.  Symptoms during or after treatment for cancer.  Symptoms of infection, especially fever.  Frequent and unusual infections. This condition is diagnosed based on your medical history and a physical exam. Tests will also be done, such as:  A complete blood count (CBC).  A procedure to collect a sample of bone marrow for examination (bone marrow biopsy).  A chest X-ray.  A urine culture.  A blood culture. How is this treated? Treatment depends on the underlying cause and severity of your condition. Mild neutropenia may not require treatment. Treatment may include medicines, such as:  Antibiotic medicine given through an IV.  Antiviral medicines.  Antifungal medicines.  A medicine to increase neutrophil production (colony-stimulating factor). You may get this drug through an IV or by injection.  Steroids given through an IV. If an underlying condition is causing neutropenia, you may need treatment for that condition. If medicines or cancer treatments are causing neutropenia, your health care provider may have you stop the medicines or treatment. Follow these instructions at home: Medicines   Take over-the-counter  and prescription medicines only as told by your health care provider.  Get a seasonal flu shot (influenza vaccine).  Avoid people who received a vaccine in the past 30 days if that vaccine contained a live version of the germ (live vaccine). You should not get a live vaccine. Common live vaccines are polio, MMR, chicken pox, and shingles vaccines. Eating and drinking  Do not share food utensils.  Do not eat unpasteurized foods.  Do not eat raw or undercooked meat, eggs, or seafood.  Do not eat unwashed, raw fruits or vegetables. Lifestyle  Avoid exposure to groups of people or children.  Avoid being around people who are sick.  Avoid being around dirt or dust, such as in construction areas or gardens.  Do not provide direct care for pets. Avoid animal droppings. Do not clean litter boxes and bird cages.  Do not have sex unless your health care provider has approved. Hygiene   Bathe daily.  Clean the area between the genitals and the anus (perineal area) after you urinate or have a bowel movement. If you are male, wipe from front to back.  Brush your teeth with a soft toothbrush before and after meals.  Do not use a regular razor. Use an electric razor to remove hair.  Wash your hands often. Make sure others who come in contact with you also wash their hands. If soap and water are not available, use hand sanitizer. General instructions  Follow any precautions as told by your health care provider to reduce your risk for injury or infection.  Take actions to avoid cuts and burns. For example: ? Be cautious when you use knives. Always cut away from yourself. ? Keep knives in protective sheaths or guards when not in use. ? Use oven mitts when you cook with a hot stove, oven, or grill. ? Stand a safe distance away from open fires.  Do not use tampons, enemas, or rectal suppositories unless your health care provider has approved.  Keep all follow-up visits as told by your  health care provider. This is important. Contact a health care provider if:  You have: ? A sore throat. ? A warm, red, or tender area on your skin. ? A cough. ? Frequent or painful urination. ? Vaginal discharge or itching.  You develop: ? Sores in your mouth or anus. ? Swollen lymph nodes. ? Red streaks on the skin. ? A rash. Get help right away if:  You have: ? A fever. ? Chills, or you start to shake.  You feel: ? Nauseous, or you vomit. ? Very fatigued. ? Short of breath. Summary  Neutropenia is a condition that occurs when you have a lower-than-normal level of a type of white blood cell (neutrophil) in your body.  This condition can occur if your body uses up or destroys neutrophils faster than your bone marrow can make them.  Treatment depends on the underlying cause and severity of your condition. Mild neutropenia may not require treatment.  Follow any precautions  as told by your health care provider to reduce your risk for injury or infection. This information is not intended to replace advice given to you by your health care provider. Make sure you discuss any questions you have with your health care provider. Document Revised: 11/04/2017 Document Reviewed: 11/04/2017 Elsevier Patient Education  [place].

## 2019-11-21 NOTE — Patient Instructions (Signed)

## 2019-11-28 ENCOUNTER — Other Ambulatory Visit: Payer: Self-pay | Admitting: Hematology & Oncology

## 2019-12-05 ENCOUNTER — Inpatient Hospital Stay: Payer: 59

## 2019-12-05 ENCOUNTER — Inpatient Hospital Stay: Payer: 59 | Attending: Hematology & Oncology

## 2019-12-05 ENCOUNTER — Inpatient Hospital Stay (HOSPITAL_BASED_OUTPATIENT_CLINIC_OR_DEPARTMENT_OTHER): Payer: 59 | Admitting: Family

## 2019-12-05 ENCOUNTER — Other Ambulatory Visit: Payer: Self-pay

## 2019-12-05 ENCOUNTER — Encounter: Payer: Self-pay | Admitting: Family

## 2019-12-05 VITALS — BP 132/63 | HR 89 | Temp 98.2°F | Resp 18

## 2019-12-05 VITALS — BP 131/72 | HR 89 | Temp 98.5°F | Resp 18 | Wt 278.8 lb

## 2019-12-05 DIAGNOSIS — C8223 Follicular lymphoma grade III, unspecified, intra-abdominal lymph nodes: Secondary | ICD-10-CM | POA: Insufficient documentation

## 2019-12-05 DIAGNOSIS — Z7984 Long term (current) use of oral hypoglycemic drugs: Secondary | ICD-10-CM | POA: Diagnosis not present

## 2019-12-05 DIAGNOSIS — Z5112 Encounter for antineoplastic immunotherapy: Secondary | ICD-10-CM | POA: Diagnosis not present

## 2019-12-05 DIAGNOSIS — Z5111 Encounter for antineoplastic chemotherapy: Secondary | ICD-10-CM | POA: Diagnosis present

## 2019-12-05 DIAGNOSIS — Z79899 Other long term (current) drug therapy: Secondary | ICD-10-CM | POA: Diagnosis not present

## 2019-12-05 LAB — CMP (CANCER CENTER ONLY)
ALT: 18 U/L (ref 0–44)
AST: 15 U/L (ref 15–41)
Albumin: 4 g/dL (ref 3.5–5.0)
Alkaline Phosphatase: 111 U/L (ref 38–126)
Anion gap: 8 (ref 5–15)
BUN: 15 mg/dL (ref 6–20)
CO2: 29 mmol/L (ref 22–32)
Calcium: 9.3 mg/dL (ref 8.9–10.3)
Chloride: 104 mmol/L (ref 98–111)
Creatinine: 0.96 mg/dL (ref 0.61–1.24)
GFR, Estimated: >60 mL/min (ref >60)
Glucose, Bld: 175 mg/dL — ABNORMAL HIGH (ref 70–99)
Potassium: 4.1 mmol/L (ref 3.5–5.1)
Sodium: 141 mmol/L (ref 135–145)
Total Bilirubin: 0.5 mg/dL (ref 0.3–1.2)
Total Protein: 6.4 g/dL — ABNORMAL LOW (ref 6.5–8.1)

## 2019-12-05 LAB — CBC WITH DIFFERENTIAL (CANCER CENTER ONLY)
Abs Immature Granulocytes: 0.03 10*3/uL (ref 0.00–0.07)
Basophils Absolute: 0 10*3/uL (ref 0.0–0.1)
Basophils Relative: 0 %
Eosinophils Absolute: 0.2 10*3/uL (ref 0.0–0.5)
Eosinophils Relative: 4 %
HCT: 37.3 % — ABNORMAL LOW (ref 39.0–52.0)
Hemoglobin: 12.8 g/dL — ABNORMAL LOW (ref 13.0–17.0)
Immature Granulocytes: 1 %
Lymphocytes Relative: 11 %
Lymphs Abs: 0.6 10*3/uL — ABNORMAL LOW (ref 0.7–4.0)
MCH: 30.8 pg (ref 26.0–34.0)
MCHC: 34.3 g/dL (ref 30.0–36.0)
MCV: 89.7 fL (ref 80.0–100.0)
Monocytes Absolute: 0.7 10*3/uL (ref 0.1–1.0)
Monocytes Relative: 12 %
Neutro Abs: 3.9 10*3/uL (ref 1.7–7.7)
Neutrophils Relative %: 72 %
Platelet Count: 131 10*3/uL — ABNORMAL LOW (ref 150–400)
RBC: 4.16 MIL/uL — ABNORMAL LOW (ref 4.22–5.81)
RDW: 12.6 % (ref 11.5–15.5)
WBC Count: 5.4 10*3/uL (ref 4.0–10.5)
nRBC: 0 % (ref 0.0–0.2)

## 2019-12-05 LAB — LACTATE DEHYDROGENASE: LDH: 158 U/L (ref 98–192)

## 2019-12-05 MED ORDER — LIDOCAINE-PRILOCAINE 2.5-2.5 % EX CREA
TOPICAL_CREAM | CUTANEOUS | Status: AC
  Filled 2019-12-05: qty 30

## 2019-12-05 MED ORDER — HEPARIN SOD (PORK) LOCK FLUSH 100 UNIT/ML IV SOLN
500.0000 [IU] | Freq: Once | INTRAVENOUS | Status: DC | PRN
  Filled 2019-12-05: qty 5

## 2019-12-05 MED ORDER — SODIUM CHLORIDE 0.9 % IV SOLN
20.0000 mg | Freq: Once | INTRAVENOUS | Status: AC
  Administered 2019-12-05: 20 mg via INTRAVENOUS
  Filled 2019-12-05: qty 20

## 2019-12-05 MED ORDER — SODIUM CHLORIDE 0.9 % IV SOLN
Freq: Once | INTRAVENOUS | Status: DC
  Filled 2019-12-05: qty 250

## 2019-12-05 MED ORDER — DIPHENHYDRAMINE HCL 25 MG PO CAPS: 50.0000 mg | ORAL_CAPSULE | Freq: Once | ORAL | Status: DC

## 2019-12-05 MED ORDER — SODIUM CHLORIDE 0.9 % IV SOLN
790.0000 mg/m2 | Freq: Once | INTRAVENOUS | Status: AC
  Administered 2019-12-05: 2000 mg via INTRAVENOUS
  Filled 2019-12-05: qty 100

## 2019-12-05 MED ORDER — HEPARIN SOD (PORK) LOCK FLUSH 100 UNIT/ML IV SOLN
500.0000 [IU] | Freq: Once | INTRAVENOUS | Status: AC | PRN
  Administered 2019-12-05: 500 [IU]
  Filled 2019-12-05: qty 5

## 2019-12-05 MED ORDER — SODIUM CHLORIDE 0.9 % IV SOLN
16.0000 mg | Freq: Once | INTRAVENOUS | Status: AC
  Administered 2019-12-05: 16 mg via INTRAVENOUS
  Filled 2019-12-05: qty 8

## 2019-12-05 MED ORDER — SODIUM CHLORIDE 0.9% FLUSH
10.0000 mL | INTRAVENOUS | Status: DC | PRN
  Filled 2019-12-05: qty 10

## 2019-12-05 MED ORDER — SODIUM CHLORIDE 0.9% FLUSH
10.0000 mL | Freq: Once | INTRAVENOUS | Status: AC
  Administered 2019-12-05: 10 mL
  Filled 2019-12-05: qty 10

## 2019-12-05 MED ORDER — ACETAMINOPHEN 325 MG PO TABS
650.0000 mg | ORAL_TABLET | Freq: Once | ORAL | Status: AC
  Administered 2019-12-05: 650 mg via ORAL

## 2019-12-05 MED ORDER — VINCRISTINE SULFATE CHEMO INJECTION 1 MG/ML
2.0000 mg | Freq: Once | INTRAVENOUS | Status: AC
  Administered 2019-12-05: 2 mg via INTRAVENOUS
  Filled 2019-12-05: qty 2

## 2019-12-05 MED ORDER — SODIUM CHLORIDE 0.9 % IV SOLN: 10.0000 mg | Freq: Once | INTRAVENOUS | Status: DC

## 2019-12-05 MED ORDER — SODIUM CHLORIDE 0.9 % IV SOLN
Freq: Once | INTRAVENOUS | Status: AC
  Filled 2019-12-05: qty 250

## 2019-12-05 MED ORDER — DIPHENHYDRAMINE HCL 50 MG/ML IJ SOLN
INTRAMUSCULAR | Status: AC
  Filled 2019-12-05: qty 1

## 2019-12-05 MED ORDER — DIPHENHYDRAMINE HCL 50 MG/ML IJ SOLN
50.0000 mg | Freq: Once | INTRAMUSCULAR | Status: AC
  Administered 2019-12-05: 12.5 mg via INTRAVENOUS

## 2019-12-05 MED ORDER — SODIUM CHLORIDE 0.9 % IV SOLN
1000.0000 mg | Freq: Once | INTRAVENOUS | Status: AC
  Administered 2019-12-05: 1000 mg via INTRAVENOUS
  Filled 2019-12-05: qty 40

## 2019-12-05 MED ORDER — SODIUM CHLORIDE 0.9% FLUSH
10.0000 mL | INTRAVENOUS | Status: DC | PRN
  Administered 2019-12-05: 10 mL
  Filled 2019-12-05: qty 10

## 2019-12-05 MED ORDER — SODIUM CHLORIDE 0.9 % IV SOLN
40.0000 mg | Freq: Once | INTRAVENOUS | Status: AC
  Administered 2019-12-05: 40 mg via INTRAVENOUS
  Filled 2019-12-05: qty 4

## 2019-12-05 MED ORDER — ACETAMINOPHEN 325 MG PO TABS: 650.0000 mg | ORAL_TABLET | Freq: Once | ORAL | Status: DC

## 2019-12-05 MED ORDER — ACETAMINOPHEN 325 MG PO TABS
ORAL_TABLET | ORAL | Status: AC
  Filled 2019-12-05: qty 2

## 2019-12-05 NOTE — Patient Instructions (Signed)

## 2019-12-05 NOTE — Progress Notes (Signed)
Hematology and Oncology Follow Up Visit  James Jennings [id number] 1970-05-20 49 y.o. 12/05/2019   Principle Diagnosis:  Follicular B- cell non-Hodgkin's lymphoma - Relapsed  Past Therapy:             Rituxan/bendamustine-s/p cycle 6 - completed on 07/03/2016  Current Therapy: Gazyva  - Cytoxan/ Vincristine/Prednisone  - started 11/07/2019, s/p cycle 1   Interim History:  James Jennings is here today for follow-up and treatment. He is doing well with treatment so far. He has had some mild fatigue at times.  He states that the nausea has been minimal and ginger tea helps resolve his symptoms. No vomiting. He gets mildly winded with activity but states this resolves with taking a moment to rest.  No fever, chills, cough, rash, dizziness, chest pain, palpitations, abdominal pain or changes in bowel or bladder habits.  No episodes of bleeding. No bruising or petechiae.  The swelling in his left ankle seems better.  His enlarged left inguinal lymph node has decreased in size to approximately 1 cm on palpation.  No tenderness, numbness or tingling in his extremities.  No falls or syncopal episodes to report.   ECOG Performance Status: 1 - Symptomatic but completely ambulatory  Medications:  Allergies as of 12/05/2019      Reactions   Mango Flavor Swelling, Other (See Comments)   LIPS SWELL   Shellfish Allergy Anaphylaxis   Lactose Intolerance (gi) Diarrhea      Medication List       Accurate as of December 05, 2019  8:11 AM. If you have any questions, ask your nurse or doctor.        acetaminophen 325 MG tablet Commonly known as: TYLENOL Take 650 mg by mouth every 6 (six) hours as needed for moderate pain or headache.   atorvastatin 80 MG tablet Commonly known as: LIPITOR Take 80 mg by mouth daily.   cetirizine 10 MG tablet Commonly known as: ZYRTEC Take 10 mg by mouth daily as needed for allergies.   gabapentin 300 MG capsule Commonly known as: Neurontin Take 1 capsule (300  mg total) by mouth at bedtime.   ibuprofen 200 MG tablet Commonly known as: ADVIL Take 600 mg by mouth at bedtime.   lidocaine-prilocaine cream Commonly known as: EMLA Apply 1 application topically as needed. Apply to [facility] 1 hour prior to procedure.   metFORMIN 500 MG tablet Commonly known as: GLUCOPHAGE Take 1 tablet (500 mg total) by mouth daily with breakfast.   montelukast 10 MG tablet Commonly known as: Singulair Take 1 tablet (10 mg total) by mouth at bedtime. Start 3 days BEFORE chemo and take for 30 days   multivitamin Tabs tablet Take 1 tablet by mouth daily.   ondansetron 8 MG tablet Commonly known as: ZOFRAN Take 1 tablet (8 mg total) by mouth every 8 (eight) hours as needed for nausea or vomiting.   prochlorperazine 10 MG tablet Commonly known as: COMPAZINE Take 1 tablet (10 mg total) by mouth every 6 (six) hours as needed for nausea or vomiting.       Allergies:  Allergies  Allergen Reactions  . Mango Flavor Swelling and Other (See Comments)    LIPS SWELL  . Shellfish Allergy Anaphylaxis  . Lactose Intolerance (Gi) Diarrhea    Past Medical History, Surgical history, Social history, and Family History were reviewed and updated.  Review of Systems: All other 10 point review of systems is negative.   Physical Exam:  vitals were not taken for this  visit.   Wt Readings from Last 3 Encounters:  11/02/19 279 lb (126.6 kg)  10/31/19 279 lb (126.6 kg)  09/28/19 275 lb (124.7 kg)    Ocular: Sclerae unicteric, pupils equal, round and reactive to light Ear-nose-throat: Oropharynx clear, dentition fair Lymphatic: No cervical, supraclavicular or axillary adenopathy, left inguinal node palpated approximately 1 cm in size on exam Lungs no rales or rhonchi, good excursion bilaterally Heart regular rate and rhythm, no murmur appreciated Abd soft, nontender, positive bowel sounds, no liver or spleen tip palpated on exam, no fluid ave  MSK no focal spinal  tenderness, no joint edema Neuro: non-focal, well-oriented, appropriate affect Breasts: Deferred   Lab Results  Component Value Date   WBC 1.8 (L) 11/21/2019   HGB 12.7 (L) 11/21/2019   HCT 36.8 (L) 11/21/2019   MCV 90.9 11/21/2019   PLT 154 11/21/2019   No results found for: FERRITIN, IRON, TIBC, UIBC, IRONPCTSAT Lab Results  Component Value Date   RETICCTPCT 1.07 12/06/2015   RBC 4.05 (L) 11/21/2019   RETICCTABS 48.79 12/06/2015   No results found for: [facility] Pyle [facility] Lab Results  Component Value Date   IGGSERUM 785 06/04/2016   IGMSERUM 52 06/04/2016   Lab Results  Component Value Date   TOTALPROTELP 6.5 10/31/2019   ALBUMINELP 3.7 10/31/2019   A1GS 0.2 10/31/2019   A2GS 0.7 10/31/2019   BETS 1.1 10/31/2019   GAMS 0.8 10/31/2019   MSPIKE Not Observed 10/31/2019     Chemistry      Component Value Date/Time   NA 140 11/21/2019 0748   NA 143 08/10/2018 1604   NA 141 12/03/2016 1155   NA 141 02/07/2016 1149   K 4.0 11/21/2019 0748   K 3.8 12/03/2016 1155   K 4.3 02/07/2016 1149   CL 104 11/21/2019 0748   CL 100 12/03/2016 1155   CO2 28 11/21/2019 0748   CO2 27 12/03/2016 1155   CO2 27 02/07/2016 1149   BUN 17 11/21/2019 0748   BUN 12 08/10/2018 1604   BUN 10 12/03/2016 1155   BUN 10.9 02/07/2016 1149   CREATININE 0.95 11/21/2019 0748   CREATININE 1.0 12/03/2016 1155   CREATININE 0.9 02/07/2016 1149      Component Value Date/Time   CALCIUM 9.3 11/21/2019 0748   CALCIUM 9.1 12/03/2016 1155   CALCIUM 9.6 02/07/2016 1149   ALKPHOS 111 11/21/2019 0748   ALKPHOS 99 (H) 12/03/2016 1155   ALKPHOS 127 02/07/2016 1149   AST 12 (L) 11/21/2019 0748   AST 17 02/07/2016 1149   ALT 13 11/21/2019 0748   ALT 24 12/03/2016 1155   ALT 16 02/07/2016 1149   BILITOT 0.6 11/21/2019 0748   BILITOT 0.42 02/07/2016 1149       Impression and Plan: [name] a pleasant 49 yo caucasian gentleman with history of follicular large cell non-Hodgkin's  lymphoma. He completed 6 cycles of chemotherapy with bendamustine on 07/03/2016 but was not tolerant of Rituxan (anaphylaxis). His lymphoma has now recurred.  He is tolerating his current treatment regimen along with Gazyva nicely so far. We will proceed with cycle 2 today as planned.  We will repeat a PET scan in 3 weeks to evaluate his response.  Follow-up in 1 month.  He can contact our office with any questions or concerns.   Laverna Peace, NP 11/2/20218:11 AM

## 2019-12-05 NOTE — Progress Notes (Signed)
Pt discharged in no apparent distress. Pt left ambulatory without assistance. Pt aware of discharge instructions and verbalized understanding and had no further questions.  

## 2019-12-06 LAB — BETA 2 MICROGLOBULIN, SERUM: Beta-2 Microglobulin: 1.5 mg/L (ref 0.6–2.4)

## 2019-12-11 ENCOUNTER — Telehealth: Payer: Self-pay | Admitting: *Deleted

## 2019-12-11 NOTE — Telephone Encounter (Signed)
Returned patient's phone call regarding rash to back of head after last chemotherapy treatment. Per Dr. Marin Olp, have him try Selsun Blue shampoo. He can continue to take the Benadryl cream. I instructed patient to call the office if the rash continues to get worse. He verbalized understanding.

## 2019-12-22 ENCOUNTER — Other Ambulatory Visit: Payer: Self-pay

## 2019-12-22 ENCOUNTER — Ambulatory Visit (HOSPITAL_COMMUNITY)
Admission: RE | Admit: 2019-12-22 | Discharge: 2019-12-22 | Disposition: A | Discharge status: Home / Self Care | Payer: 59 | Source: Ambulatory Visit | Attending: Family | Admitting: Family

## 2019-12-22 DIAGNOSIS — C8223 Follicular lymphoma grade III, unspecified, intra-abdominal lymph nodes: Secondary | ICD-10-CM | POA: Insufficient documentation

## 2019-12-22 LAB — GLUCOSE, CAPILLARY: Glucose-Capillary: 192 mg/dL — ABNORMAL HIGH (ref 70–99)

## 2019-12-22 MED ORDER — FLUDEOXYGLUCOSE F - 18 (FDG) INJECTION
13.8000 | Freq: Once | INTRAVENOUS | Status: AC | PRN
  Administered 2019-12-22: 13.8 via INTRAVENOUS

## 2019-12-25 ENCOUNTER — Encounter: Payer: Self-pay | Admitting: *Deleted

## 2019-12-28 ENCOUNTER — Other Ambulatory Visit: Payer: Self-pay | Admitting: Hematology & Oncology

## 2020-01-02 ENCOUNTER — Ambulatory Visit: Payer: 59 | Admitting: Hematology & Oncology

## 2020-01-02 ENCOUNTER — Ambulatory Visit: Payer: 59

## 2020-01-02 ENCOUNTER — Other Ambulatory Visit: Payer: 59

## 2020-01-03 ENCOUNTER — Other Ambulatory Visit: Payer: Self-pay

## 2020-01-03 ENCOUNTER — Inpatient Hospital Stay: Payer: 59 | Attending: Hematology & Oncology

## 2020-01-03 ENCOUNTER — Telehealth: Payer: Self-pay

## 2020-01-03 ENCOUNTER — Inpatient Hospital Stay: Payer: 59

## 2020-01-03 ENCOUNTER — Inpatient Hospital Stay (HOSPITAL_BASED_OUTPATIENT_CLINIC_OR_DEPARTMENT_OTHER): Payer: 59 | Admitting: Hematology & Oncology

## 2020-01-03 ENCOUNTER — Encounter: Payer: Self-pay | Admitting: Hematology & Oncology

## 2020-01-03 VITALS — BP 121/84 | HR 90 | Temp 97.8°F | Resp 20 | Wt 281.0 lb

## 2020-01-03 VITALS — BP 142/69 | HR 89 | Temp 98.8°F | Resp 16

## 2020-01-03 DIAGNOSIS — Z5112 Encounter for antineoplastic immunotherapy: Secondary | ICD-10-CM | POA: Diagnosis not present

## 2020-01-03 DIAGNOSIS — Z79899 Other long term (current) drug therapy: Secondary | ICD-10-CM | POA: Diagnosis not present

## 2020-01-03 DIAGNOSIS — C8223 Follicular lymphoma grade III, unspecified, intra-abdominal lymph nodes: Secondary | ICD-10-CM | POA: Diagnosis present

## 2020-01-03 DIAGNOSIS — Z5111 Encounter for antineoplastic chemotherapy: Secondary | ICD-10-CM | POA: Diagnosis present

## 2020-01-03 DIAGNOSIS — Z7984 Long term (current) use of oral hypoglycemic drugs: Secondary | ICD-10-CM | POA: Insufficient documentation

## 2020-01-03 LAB — CMP (CANCER CENTER ONLY)
ALT: 24 U/L (ref 0–44)
AST: 22 U/L (ref 15–41)
Albumin: 4 g/dL (ref 3.5–5.0)
Alkaline Phosphatase: 103 U/L (ref 38–126)
Anion gap: 6 (ref 5–15)
BUN: 19 mg/dL (ref 6–20)
CO2: 28 mmol/L (ref 22–32)
Calcium: 9.2 mg/dL (ref 8.9–10.3)
Chloride: 105 mmol/L (ref 98–111)
Creatinine: 0.99 mg/dL (ref 0.61–1.24)
GFR, Estimated: >60 mL/min (ref >60)
Glucose, Bld: 138 mg/dL — ABNORMAL HIGH (ref 70–99)
Potassium: 4.2 mmol/L (ref 3.5–5.1)
Sodium: 139 mmol/L (ref 135–145)
Total Bilirubin: 0.6 mg/dL (ref 0.3–1.2)
Total Protein: 6.6 g/dL (ref 6.5–8.1)

## 2020-01-03 LAB — CBC WITH DIFFERENTIAL (CANCER CENTER ONLY)
Abs Immature Granulocytes: 0.02 10*3/uL (ref 0.00–0.07)
Basophils Absolute: 0 10*3/uL (ref 0.0–0.1)
Basophils Relative: 0 %
Eosinophils Absolute: 0.1 10*3/uL (ref 0.0–0.5)
Eosinophils Relative: 3 %
HCT: 36.8 % — ABNORMAL LOW (ref 39.0–52.0)
Hemoglobin: 12.5 g/dL — ABNORMAL LOW (ref 13.0–17.0)
Immature Granulocytes: 0 %
Lymphocytes Relative: 14 %
Lymphs Abs: 0.7 10*3/uL (ref 0.7–4.0)
MCH: 31.1 pg (ref 26.0–34.0)
MCHC: 34 g/dL (ref 30.0–36.0)
MCV: 91.5 fL (ref 80.0–100.0)
Monocytes Absolute: 0.7 10*3/uL (ref 0.1–1.0)
Monocytes Relative: 16 %
Neutro Abs: 3 10*3/uL (ref 1.7–7.7)
Neutrophils Relative %: 67 %
Platelet Count: 179 10*3/uL (ref 150–400)
RBC: 4.02 MIL/uL — ABNORMAL LOW (ref 4.22–5.81)
RDW: 13 % (ref 11.5–15.5)
WBC Count: 4.6 10*3/uL (ref 4.0–10.5)
nRBC: 0 % (ref 0.0–0.2)

## 2020-01-03 LAB — LACTATE DEHYDROGENASE: LDH: 169 U/L (ref 98–192)

## 2020-01-03 MED ORDER — DIPHENHYDRAMINE HCL 50 MG/ML IJ SOLN: 12.5000 mg | Freq: Once | INTRAMUSCULAR | Status: DC

## 2020-01-03 MED ORDER — SODIUM CHLORIDE 0.9% FLUSH
10.0000 mL | INTRAVENOUS | Status: DC | PRN
  Administered 2020-01-03: 10 mL
  Filled 2020-01-03: qty 10

## 2020-01-03 MED ORDER — VINCRISTINE SULFATE CHEMO INJECTION 1 MG/ML
2.0000 mg | Freq: Once | INTRAVENOUS | Status: AC
  Administered 2020-01-03: 2 mg via INTRAVENOUS
  Filled 2020-01-03: qty 2

## 2020-01-03 MED ORDER — SODIUM CHLORIDE 0.9 % IV SOLN
Freq: Once | INTRAVENOUS | Status: DC
  Filled 2020-01-03: qty 250

## 2020-01-03 MED ORDER — SODIUM CHLORIDE 0.9 % IV SOLN
16.0000 mg | Freq: Once | INTRAVENOUS | Status: AC
  Administered 2020-01-03: 16 mg via INTRAVENOUS
  Filled 2020-01-03: qty 8

## 2020-01-03 MED ORDER — DIPHENHYDRAMINE HCL 50 MG/ML IJ SOLN
INTRAMUSCULAR | Status: AC
  Filled 2020-01-03: qty 1

## 2020-01-03 MED ORDER — SODIUM CHLORIDE 0.9 % IV SOLN
20.0000 mg | Freq: Once | INTRAVENOUS | Status: AC
  Administered 2020-01-03: 20 mg via INTRAVENOUS
  Filled 2020-01-03: qty 20

## 2020-01-03 MED ORDER — ACETAMINOPHEN 325 MG PO TABS
ORAL_TABLET | ORAL | Status: AC
  Filled 2020-01-03: qty 2

## 2020-01-03 MED ORDER — SODIUM CHLORIDE 0.9 % IV SOLN
10.0000 mg | Freq: Once | INTRAVENOUS | Status: DC
  Filled 2020-01-03: qty 1

## 2020-01-03 MED ORDER — HEPARIN SOD (PORK) LOCK FLUSH 100 UNIT/ML IV SOLN
500.0000 [IU] | Freq: Once | INTRAVENOUS | Status: AC | PRN
  Administered 2020-01-03: 500 [IU]
  Filled 2020-01-03: qty 5

## 2020-01-03 MED ORDER — SODIUM CHLORIDE 0.9% FLUSH
10.0000 mL | INTRAVENOUS | Status: DC | PRN
  Filled 2020-01-03: qty 10

## 2020-01-03 MED ORDER — ACETAMINOPHEN 325 MG PO TABS
650.0000 mg | ORAL_TABLET | Freq: Once | ORAL | Status: AC
  Administered 2020-01-03: 650 mg via ORAL

## 2020-01-03 MED ORDER — SODIUM CHLORIDE 0.9 % IV SOLN
1000.0000 mg | Freq: Once | INTRAVENOUS | Status: AC
  Administered 2020-01-03: 1000 mg via INTRAVENOUS
  Filled 2020-01-03: qty 40

## 2020-01-03 MED ORDER — DIPHENHYDRAMINE HCL 50 MG/ML IJ SOLN: 25.0000 mg | Freq: Once | INTRAMUSCULAR | Status: DC

## 2020-01-03 MED ORDER — ACETAMINOPHEN 325 MG PO TABS: 650.0000 mg | ORAL_TABLET | Freq: Once | ORAL | Status: DC

## 2020-01-03 MED ORDER — SODIUM CHLORIDE 0.9 % IV SOLN
790.0000 mg/m2 | Freq: Once | INTRAVENOUS | Status: AC
  Administered 2020-01-03: 2000 mg via INTRAVENOUS
  Filled 2020-01-03: qty 100

## 2020-01-03 MED ORDER — DIPHENHYDRAMINE HCL 50 MG/ML IJ SOLN
12.5000 mg | Freq: Once | INTRAMUSCULAR | Status: AC
  Administered 2020-01-03: 12.5 mg via INTRAVENOUS

## 2020-01-03 MED ORDER — SODIUM CHLORIDE 0.9 % IV SOLN
40.0000 mg | Freq: Once | INTRAVENOUS | Status: AC
  Administered 2020-01-03: 40 mg via INTRAVENOUS
  Filled 2020-01-03: qty 4

## 2020-01-03 MED ORDER — SODIUM CHLORIDE 0.9 % IV SOLN
Freq: Once | INTRAVENOUS | Status: AC
  Filled 2020-01-03: qty 250

## 2020-01-03 MED ORDER — HEPARIN SOD (PORK) LOCK FLUSH 100 UNIT/ML IV SOLN
500.0000 [IU] | Freq: Once | INTRAVENOUS | Status: DC | PRN
  Filled 2020-01-03: qty 5

## 2020-01-03 NOTE — Patient Instructions (Signed)

## 2020-01-03 NOTE — Addendum Note (Signed)
Addended by: Burney Gauze R on: 01/03/2020 08:51 AM   Modules accepted: Orders

## 2020-01-03 NOTE — Telephone Encounter (Signed)
Per Sheria Lang move pts 01/30/20 appts to 7:45 lab, cancel port flush, Dr. Johnette Abraham at 8:00 and dble book chemo, pt does not need a print out.... AOM

## 2020-01-03 NOTE — Telephone Encounter (Signed)
12/29 appts per 01/03/20 los, pt already scheduled for 12/28 and 02/27/20.Marland KitchenMarland KitchenAOM

## 2020-01-03 NOTE — Progress Notes (Signed)
Pt discharged in no apparent distress. Pt left ambulatory with/without assistance. Pt aware of discharge instructions andverbalized understanding and had no further questions. 

## 2020-01-03 NOTE — Patient Instructions (Signed)
[place] Discharge Instructions for Patients Receiving Chemotherapy  Today you received the following chemotherapy agents Gazyva, Cytoxan, Vincristine  To help prevent nausea and vomiting after your treatment, we encourage you to take your nausea medication    If you develop nausea and vomiting that is not controlled by your nausea medication, call the clinic.   BELOW ARE SYMPTOMS THAT SHOULD BE REPORTED IMMEDIATELY:  *FEVER GREATER THAN 100.5 F  *CHILLS WITH OR WITHOUT FEVER  NAUSEA AND VOMITING THAT IS NOT CONTROLLED WITH YOUR NAUSEA MEDICATION  *UNUSUAL SHORTNESS OF BREATH  *UNUSUAL BRUISING OR BLEEDING  TENDERNESS IN MOUTH AND THROAT WITH OR WITHOUT PRESENCE OF ULCERS  *URINARY PROBLEMS  *BOWEL PROBLEMS  UNUSUAL RASH Items with * indicate a potential emergency and should be followed up as soon as possible.  Feel free to call the clinic should you have any questions or concerns. The clinic phone number is [phone].  Please show the [facility] at check-in to the Emergency Department and triage nurse.

## 2020-01-03 NOTE — Progress Notes (Signed)
Hematology and Oncology Follow Up Visit  James Jennings [id number] Aug 24, 1970 49 y.o. 01/03/2020   Principle Diagnosis:  Follicular B- cell non-Hodgkin's lymphoma - Relapsed  Past Therapy:             Rituxan/bendamustine-s/p cycle 6 - completed on 07/03/2016  Current Therapy: Gazyva  - Cytoxan/ Vincristine/Prednisone  - started 11/07/2019, s/p cycle #2   Interim History:  James Jennings is here today for follow-up.  He did have a nice Thanksgiving.  He was with his family.  It was up a fairly quiet.  He still working from home.  He enjoys coming out to the office so he gets out of the house.  He is trying to lose weight.  He is exercising more.  He has a bike at home that he rides.  We did do a PET scan on him.  This was done last week.  The PET scan showed a response.  Had decrease in his lymph node size and in the SUV activity.  There is still some lymph nodes that had activity.  We have to be careful with these lymph nodes.  He has had no problems with treatment.  He has had no problems with hair loss.  He has had no nausea or vomiting.  Is had no change in bowel or bladder habits.  Preoperative treatments that his hair is thinning a little bit.  He has had no rashes.  His appetite has been quite good.  Overall, his performance status is ECOG 1.    Medications:  Allergies as of 01/03/2020      Reactions   Mango Flavor Swelling, Other (See Comments)   LIPS SWELL   Shellfish Allergy Anaphylaxis   Lactose Intolerance (gi) Diarrhea      Medication List       Accurate as of January 03, 2020  8:17 AM. If you have any questions, ask your nurse or doctor.        acetaminophen 325 MG tablet Commonly known as: TYLENOL Take 650 mg by mouth every 6 (six) hours as needed for moderate pain or headache.   atorvastatin 80 MG tablet Commonly known as: LIPITOR Take 80 mg by mouth daily.   cetirizine 10 MG tablet Commonly known as: ZYRTEC Take 10 mg by mouth daily as needed for  allergies.   gabapentin 300 MG capsule Commonly known as: Neurontin Take 1 capsule (300 mg total) by mouth at bedtime.   ibuprofen 200 MG tablet Commonly known as: ADVIL Take 600 mg by mouth at bedtime.   lidocaine-prilocaine cream Commonly known as: EMLA Apply 1 application topically as needed. Apply to [facility] 1 hour prior to procedure.   metFORMIN 500 MG tablet Commonly known as: GLUCOPHAGE Take 1 tablet (500 mg total) by mouth daily with breakfast.   montelukast 10 MG tablet Commonly known as: SINGULAIR TAKE 1 TABLET BY MOUTH AT BEDTIME. START 3 DAYS BEFORE CHEMO AND TAKE FOR 30 DAYS What changed: See the new instructions.   multivitamin Tabs tablet Take 1 tablet by mouth daily.   ondansetron 8 MG tablet Commonly known as: ZOFRAN Take 1 tablet (8 mg total) by mouth every 8 (eight) hours as needed for nausea or vomiting.   prochlorperazine 10 MG tablet Commonly known as: COMPAZINE Take 1 tablet (10 mg total) by mouth every 6 (six) hours as needed for nausea or vomiting.       Allergies:  Allergies  Allergen Reactions  . Mango Flavor Swelling and Other (See Comments)  LIPS SWELL  . Shellfish Allergy Anaphylaxis  . Lactose Intolerance (Gi) Diarrhea    Past Medical History, Surgical history, Social history, and Family History were reviewed and updated.  Review of Systems: Review of Systems  Constitutional: Negative.   HENT: Negative.   Eyes: Negative.   Respiratory: Negative.   Cardiovascular: Negative.   Gastrointestinal: Negative.   Genitourinary: Negative.   Musculoskeletal: Negative.   Skin: Negative.   Neurological: Negative.   Endo/Heme/Allergies: Negative.   Psychiatric/Behavioral: Negative.      Physical Exam:  weight is 281 lb (127.5 kg). His oral temperature is 97.8 F (36.6 C). His blood pressure is 121/84 and his pulse is 90. His respiration is 20 and oxygen saturation is 98%.   Wt Readings from Last 3 Encounters:  01/03/20 281 lb (127.5  kg)  12/05/19 278 lb 12 oz (126.4 kg)  11/02/19 279 lb (126.6 kg)    Physical Exam Vitals reviewed.  HENT:     Head: Normocephalic and atraumatic.  Eyes:     Pupils: Pupils are equal, round, and reactive to light.  Cardiovascular:     Rate and Rhythm: Normal rate and regular rhythm.     Heart sounds: Normal heart sounds.  Pulmonary:     Effort: Pulmonary effort is normal.     Breath sounds: Normal breath sounds.  Abdominal:     General: Bowel sounds are normal.     Palpations: Abdomen is soft.  Musculoskeletal:        General: No tenderness or deformity. Normal range of motion.     Cervical back: Normal range of motion.  Lymphadenopathy:     Cervical: No cervical adenopathy.  Skin:    General: Skin is warm and dry.     Findings: No erythema or rash.  Neurological:     Mental Status: He is alert and oriented to person, place, and time.  Psychiatric:        Behavior: Behavior normal.        Thought Content: Thought content normal.        Judgment: Judgment normal.      Lab Results  Component Value Date   WBC 4.6 01/03/2020   HGB 12.5 (L) 01/03/2020   HCT 36.8 (L) 01/03/2020   MCV 91.5 01/03/2020   PLT 179 01/03/2020   No results found for: FERRITIN, IRON, TIBC, UIBC, IRONPCTSAT Lab Results  Component Value Date   RETICCTPCT 1.07 12/06/2015   RBC 4.02 (L) 01/03/2020   RETICCTABS 48.79 12/06/2015   No results found for: Nils Pyle [facility] Lab Results  Component Value Date   IGGSERUM 785 06/04/2016   IGMSERUM 52 06/04/2016   Lab Results  Component Value Date   TOTALPROTELP 6.5 10/31/2019   ALBUMINELP 3.7 10/31/2019   A1GS 0.2 10/31/2019   A2GS 0.7 10/31/2019   BETS 1.1 10/31/2019   GAMS 0.8 10/31/2019   MSPIKE Not Observed 10/31/2019     Chemistry      Component Value Date/Time   NA 141 12/05/2019 0821   NA 143 08/10/2018 1604   NA 141 12/03/2016 1155   NA 141 02/07/2016 1149   K 4.1 12/05/2019 0821   K 3.8 12/03/2016 1155    K 4.3 02/07/2016 1149   CL 104 12/05/2019 0821   CL 100 12/03/2016 1155   CO2 29 12/05/2019 0821   CO2 27 12/03/2016 1155   CO2 27 02/07/2016 1149   BUN 15 12/05/2019 0821   BUN 12 08/10/2018 1604   BUN 10  12/03/2016 1155   BUN 10.9 02/07/2016 1149   CREATININE 0.96 12/05/2019 0821   CREATININE 1.0 12/03/2016 1155   CREATININE 0.9 02/07/2016 1149      Component Value Date/Time   CALCIUM 9.3 12/05/2019 0821   CALCIUM 9.1 12/03/2016 1155   CALCIUM 9.6 02/07/2016 1149   ALKPHOS 111 12/05/2019 0821   ALKPHOS 99 (H) 12/03/2016 1155   ALKPHOS 127 02/07/2016 1149   AST 15 12/05/2019 0821   AST 17 02/07/2016 1149   ALT 18 12/05/2019 0821   ALT 24 12/03/2016 1155   ALT 16 02/07/2016 1149   BILITOT 0.5 12/05/2019 0821   BILITOT 0.42 02/07/2016 1149       Impression and Plan: [name] a pleasant 49 yo caucasian gentleman with history of follicular large cell non-Hodgkin's lymphoma. He completed 6 cycles of chemotherapy with bendamustine on 07/03/2016 but was not tolerant of Rituxan (anaphylaxis). His lymphoma has now recurred.   At this point, we will proceed with his third cycle of treatment.  After his fourth cycle, we will have to do another PET scan on him.  I think if the PET scan is still active that time, we will going to have to make a change with protocol and probably throw in Adriamycin.  I am just glad that he is responding.  With recurrence of his lymphoma, it would not surprise me that the response is a little bit "muted."  We will plan to get him back in another 4 weeks.   [name], MD 12/1/20218:17 AM

## 2020-01-04 LAB — BETA 2 MICROGLOBULIN, SERUM: Beta-2 Microglobulin: 1.7 mg/L (ref 0.6–2.4)

## 2020-01-15 ENCOUNTER — Encounter: Payer: Self-pay | Admitting: Hematology & Oncology

## 2020-01-30 ENCOUNTER — Inpatient Hospital Stay: Payer: 59

## 2020-01-30 ENCOUNTER — Inpatient Hospital Stay: Payer: 59 | Admitting: Hematology & Oncology

## 2020-01-30 ENCOUNTER — Other Ambulatory Visit: Payer: Self-pay

## 2020-01-30 ENCOUNTER — Telehealth: Payer: Self-pay | Admitting: Hematology & Oncology

## 2020-01-30 ENCOUNTER — Encounter: Payer: Self-pay | Admitting: Hematology & Oncology

## 2020-01-30 ENCOUNTER — Inpatient Hospital Stay (HOSPITAL_BASED_OUTPATIENT_CLINIC_OR_DEPARTMENT_OTHER): Payer: 59 | Admitting: Hematology & Oncology

## 2020-01-30 VITALS — BP 127/71 | HR 89 | Temp 98.1°F | Resp 18 | Wt 281.5 lb

## 2020-01-30 VITALS — BP 107/66 | HR 82 | Temp 98.1°F | Resp 17

## 2020-01-30 DIAGNOSIS — C8223 Follicular lymphoma grade III, unspecified, intra-abdominal lymph nodes: Secondary | ICD-10-CM

## 2020-01-30 DIAGNOSIS — Z5112 Encounter for antineoplastic immunotherapy: Secondary | ICD-10-CM | POA: Diagnosis not present

## 2020-01-30 LAB — CMP (CANCER CENTER ONLY)
ALT: 28 U/L (ref 0–44)
AST: 22 U/L (ref 15–41)
Albumin: 4.2 g/dL (ref 3.5–5.0)
Alkaline Phosphatase: 100 U/L (ref 38–126)
Anion gap: 8 (ref 5–15)
BUN: 15 mg/dL (ref 6–20)
CO2: 27 mmol/L (ref 22–32)
Calcium: 9.4 mg/dL (ref 8.9–10.3)
Chloride: 104 mmol/L (ref 98–111)
Creatinine: 1.09 mg/dL (ref 0.61–1.24)
GFR, Estimated: >60 mL/min (ref >60)
Glucose, Bld: 159 mg/dL — ABNORMAL HIGH (ref 70–99)
Potassium: 4.4 mmol/L (ref 3.5–5.1)
Sodium: 139 mmol/L (ref 135–145)
Total Bilirubin: 0.7 mg/dL (ref 0.3–1.2)
Total Protein: 6.7 g/dL (ref 6.5–8.1)

## 2020-01-30 LAB — CBC WITH DIFFERENTIAL (CANCER CENTER ONLY)
Abs Immature Granulocytes: 0.01 10*3/uL (ref 0.00–0.07)
Basophils Absolute: 0 10*3/uL (ref 0.0–0.1)
Basophils Relative: 1 %
Eosinophils Absolute: 0.1 10*3/uL (ref 0.0–0.5)
Eosinophils Relative: 3 %
HCT: 39 % (ref 39.0–52.0)
Hemoglobin: 13.4 g/dL (ref 13.0–17.0)
Immature Granulocytes: 0 %
Lymphocytes Relative: 14 %
Lymphs Abs: 0.7 10*3/uL (ref 0.7–4.0)
MCH: 31.1 pg (ref 26.0–34.0)
MCHC: 34.4 g/dL (ref 30.0–36.0)
MCV: 90.5 fL (ref 80.0–100.0)
Monocytes Absolute: 0.7 10*3/uL (ref 0.1–1.0)
Monocytes Relative: 15 %
Neutro Abs: 3.2 10*3/uL (ref 1.7–7.7)
Neutrophils Relative %: 67 %
Platelet Count: 200 10*3/uL (ref 150–400)
RBC: 4.31 MIL/uL (ref 4.22–5.81)
RDW: 13.2 % (ref 11.5–15.5)
WBC Count: 4.8 10*3/uL (ref 4.0–10.5)
nRBC: 0 % (ref 0.0–0.2)

## 2020-01-30 LAB — LACTATE DEHYDROGENASE: LDH: 155 U/L (ref 98–192)

## 2020-01-30 MED ORDER — SODIUM CHLORIDE 0.9 % IV SOLN
790.0000 mg/m2 | Freq: Once | INTRAVENOUS | Status: AC
  Administered 2020-01-30: 14:00:00 2000 mg via INTRAVENOUS
  Filled 2020-01-30: qty 100

## 2020-01-30 MED ORDER — SODIUM CHLORIDE 0.9 % IV SOLN
16.0000 mg | Freq: Once | INTRAVENOUS | Status: AC
  Administered 2020-01-30: 10:00:00 16 mg via INTRAVENOUS
  Filled 2020-01-30: qty 8

## 2020-01-30 MED ORDER — HEPARIN SOD (PORK) LOCK FLUSH 100 UNIT/ML IV SOLN
500.0000 [IU] | Freq: Once | INTRAVENOUS | Status: AC | PRN
  Administered 2020-01-30: 16:00:00 500 [IU]
  Filled 2020-01-30: qty 5

## 2020-01-30 MED ORDER — DIPHENHYDRAMINE HCL 50 MG/ML IJ SOLN
12.5000 mg | Freq: Once | INTRAMUSCULAR | Status: AC
  Administered 2020-01-30: 09:00:00 12.5 mg via INTRAVENOUS

## 2020-01-30 MED ORDER — ACETAMINOPHEN 325 MG PO TABS
650.0000 mg | ORAL_TABLET | Freq: Once | ORAL | Status: AC
  Administered 2020-01-30: 09:00:00 650 mg via ORAL

## 2020-01-30 MED ORDER — SODIUM CHLORIDE 0.9 % IV SOLN
Freq: Once | INTRAVENOUS | Status: AC
  Filled 2020-01-30: qty 250

## 2020-01-30 MED ORDER — DIPHENHYDRAMINE HCL 50 MG/ML IJ SOLN
INTRAMUSCULAR | Status: AC
  Filled 2020-01-30: qty 1

## 2020-01-30 MED ORDER — ACETAMINOPHEN 325 MG PO TABS
ORAL_TABLET | ORAL | Status: AC
  Filled 2020-01-30: qty 2

## 2020-01-30 MED ORDER — SODIUM CHLORIDE 0.9 % IV SOLN
1000.0000 mg | Freq: Once | INTRAVENOUS | Status: AC
  Administered 2020-01-30: 10:00:00 1000 mg via INTRAVENOUS
  Filled 2020-01-30: qty 40

## 2020-01-30 MED ORDER — FAMOTIDINE IN NACL 20-0.9 MG/50ML-% IV SOLN
INTRAVENOUS | Status: AC
  Filled 2020-01-30: qty 50

## 2020-01-30 MED ORDER — SODIUM CHLORIDE 0.9 % IV SOLN
20.0000 mg | Freq: Once | INTRAVENOUS | Status: AC
  Administered 2020-01-30: 09:00:00 20 mg via INTRAVENOUS
  Filled 2020-01-30: qty 20

## 2020-01-30 MED ORDER — SODIUM CHLORIDE 0.9% FLUSH
10.0000 mL | INTRAVENOUS | Status: DC | PRN
  Administered 2020-01-30: 16:00:00 10 mL
  Filled 2020-01-30: qty 10

## 2020-01-30 MED ORDER — SODIUM CHLORIDE 0.9 % IV SOLN
2.0000 mg | Freq: Once | INTRAVENOUS | Status: AC
  Administered 2020-01-30: 14:00:00 2 mg via INTRAVENOUS
  Filled 2020-01-30: qty 2

## 2020-01-30 MED ORDER — SODIUM CHLORIDE 0.9 % IV SOLN
40.0000 mg | Freq: Once | INTRAVENOUS | Status: AC
  Administered 2020-01-30: 10:00:00 40 mg via INTRAVENOUS
  Filled 2020-01-30: qty 4

## 2020-01-30 NOTE — Progress Notes (Signed)
Pt discharged in no apparent distress. Pt left ambulatory without assistance. Pt aware of discharge instructions and verbalized understanding and had no further questions.  

## 2020-01-30 NOTE — Telephone Encounter (Signed)
Appointments were already scheduled per 12/28 los at last visit/ PET Scan will be scheduled once it has been approved as well

## 2020-01-30 NOTE — Patient Instructions (Signed)
Cyclophosphamide Injection What is this medicine? CYCLOPHOSPHAMIDE (sye kloe FOSS fa mide) is a chemotherapy drug. It slows the growth of cancer cells. This medicine is used to treat many types of cancer like lymphoma, myeloma, leukemia, breast cancer, and ovarian cancer, to name a few. This medicine may be used for other purposes; ask your health care provider or pharmacist if you have questions. COMMON BRAND NAME(S): Cytoxan, Neosar What should I tell my health care provider before I take this medicine? They need to know if you have any of these conditions:  heart disease  history of irregular heartbeat  infection  kidney disease  liver disease  low blood counts, like white cells, platelets, or red blood cells  on hemodialysis  recent or ongoing radiation therapy  scarring or thickening of the lungs  trouble passing urine  an unusual or allergic reaction to cyclophosphamide, other medicines, foods, dyes, or preservatives  pregnant or trying to get pregnant  breast-feeding How should I use this medicine? This drug is usually given as an injection into a vein or muscle or by infusion into a vein. It is administered in a hospital or clinic by a specially trained health care professional. Talk to your pediatrician regarding the use of this medicine in children. Special care may be needed. Overdosage: If you think you have taken too much of this medicine contact a poison control center or emergency room at once. NOTE: This medicine is only for you. Do not share this medicine with others. What if I miss a dose? It is important not to miss your dose. Call your doctor or health care professional if you are unable to keep an appointment. What may interact with this medicine?  amphotericin B  azathioprine  certain antivirals for HIV or hepatitis  certain medicines for blood pressure, heart disease, irregular heart beat  certain medicines that treat or prevent blood clots  like warfarin  certain other medicines for cancer  cyclosporine  etanercept  indomethacin  medicines that relax muscles for surgery  medicines to increase blood counts  metronidazole This list may not describe all possible interactions. Give your health care provider a list of all the medicines, herbs, non-prescription drugs, or dietary supplements you use. Also tell them if you smoke, drink alcohol, or use illegal drugs. Some items may interact with your medicine. What should I watch for while using this medicine? Your condition will be monitored carefully while you are receiving this medicine. You may need blood work done while you are taking this medicine. Drink water or other fluids as directed. Urinate often, even at night. Some products may contain alcohol. Ask your health care professional if this medicine contains alcohol. Be sure to tell all health care professionals you are taking this medicine. Certain medicines, like metronidazole and disulfiram, can cause an unpleasant reaction when taken with alcohol. The reaction includes flushing, headache, nausea, vomiting, sweating, and increased thirst. The reaction can last from 30 minutes to several hours. Do not become pregnant while taking this medicine or for 1 year after stopping it. Women should inform their health care professional if they wish to become pregnant or think they might be pregnant. Men should not father a child while taking this medicine and for 4 months after stopping it. There is potential for serious side effects to an unborn child. Talk to your health care professional for more information. Do not breast-feed an infant while taking this medicine or for 1 week after stopping it. This medicine has  caused ovarian failure in some women. This medicine may make it more difficult to get pregnant. Talk to your health care professional if you are concerned about your fertility. This medicine has caused decreased sperm  counts in some men. This may make it more difficult to father a child. Talk to your health care professional if you are concerned about your fertility. Call your health care professional for advice if you get a fever, chills, or sore throat, or other symptoms of a cold or flu. Do not treat yourself. This medicine decreases your body's ability to fight infections. Try to avoid being around people who are sick. Avoid taking medicines that contain aspirin, acetaminophen, ibuprofen, naproxen, or ketoprofen unless instructed by your health care professional. These medicines may hide a fever. Talk to your health care professional about your risk of cancer. You may be more at risk for certain types of cancer if you take this medicine. If you are going to need surgery or other procedure, tell your health care professional that you are using this medicine. Be careful brushing or flossing your teeth or using a toothpick because you may get an infection or bleed more easily. If you have any dental work done, tell your dentist you are receiving this medicine. What side effects may I notice from receiving this medicine? Side effects that you should report to your doctor or health care professional as soon as possible:  allergic reactions like skin rash, itching or hives, swelling of the face, lips, or tongue  breathing problems  nausea, vomiting  signs and symptoms of bleeding such as bloody or black, tarry stools; red or dark brown urine; spitting up blood or brown material that looks like coffee grounds; red spots on the skin; unusual bruising or bleeding from the eyes, gums, or nose  signs and symptoms of heart failure like fast, irregular heartbeat, sudden weight gain; swelling of the ankles, feet, hands  signs and symptoms of infection like fever; chills; cough; sore throat; pain or trouble passing urine  signs and symptoms of kidney injury like trouble passing urine or change in the amount of  urine  signs and symptoms of liver injury like dark yellow or brown urine; general ill feeling or flu-like symptoms; light-colored stools; loss of appetite; nausea; right upper belly pain; unusually weak or tired; yellowing of the eyes or skin Side effects that usually do not require medical attention (report to your doctor or health care professional if they continue or are bothersome):  confusion  decreased hearing  diarrhea  facial flushing  hair loss  headache  loss of appetite  missed menstrual periods  signs and symptoms of low red blood cells or anemia such as unusually weak or tired; feeling faint or lightheaded; falls  skin discoloration This list may not describe all possible side effects. Call your doctor for medical advice about side effects. You may report side effects to FDA at 1-800-FDA-1088. Where should I keep my medicine? This drug is given in a hospital or clinic and will not be stored at home. NOTE: This sheet is a summary. It may not cover all possible information. If you have questions about this medicine, talk to your doctor, pharmacist, or health care provider.  2020 [employer]/Gold Standard (2018-10-24 09:53:29) Vincristine injection What is this medicine? VINCRISTINE (vin KRIS teen) is a chemotherapy drug. It slows the growth of cancer cells. This medicine is used to treat many types of cancer like Hodgkin's disease, leukemia, non-Hodgkin's lymphoma, neuroblastoma (brain cancer),  rhabdomyosarcoma, and Wilms' tumor. This medicine may be used for other purposes; ask your health care provider or pharmacist if you have questions. COMMON BRAND NAME(S): Oncovin, Vincasar PFS What should I tell my health care provider before I take this medicine? They need to know if you have any of these conditions:  blood disorders  gout  infection (especially chickenpox, cold sores, or herpes)  kidney disease  liver disease  lung disease  nervous system disease  like Charcot-Marie-Tooth (CMT)  recent or ongoing radiation therapy  an unusual or allergic reaction to vincristine, other chemotherapy agents, other medicines, foods, dyes, or preservatives  pregnant or trying to get pregnant  breast-feeding How should I use this medicine? This drug is given as an infusion into a vein. It is administered in a hospital or clinic by a specially trained health care professional. If you have pain, swelling, burning, or any unusual feeling around the site of your injection, tell your health care professional right away. Talk to your pediatrician regarding the use of this medicine in children. While this drug may be prescribed for selected conditions, precautions do apply. Overdosage: If you think you have taken too much of this medicine contact a poison control center or emergency room at once. NOTE: This medicine is only for you. Do not share this medicine with others. What if I miss a dose? It is important not to miss your dose. Call your doctor or health care professional if you are unable to keep an appointment. What may interact with this medicine? Do not take this medicine with any of the following medications:  itraconazole  mibefradil  voriconazole This medicine may also interact with the following medications:  cyclosporine  erythromycin  fluconazole  ketoconazole  medicines for HIV like delavirdine, efavirenz, nevirapine  medicines for seizures like ethotoin, fosphenotoin, phenytoin  medicines to increase blood counts like filgrastim, pegfilgrastim, sargramostim  other chemotherapy drugs like cisplatin, L-asparaginase, methotrexate, mitomycin, paclitaxel  pegaspargase  vaccines  zalcitabine, ddC Talk to your doctor or health care professional before taking any of these medicines:  acetaminophen  aspirin  ibuprofen  ketoprofen  naproxen This list may not describe all possible interactions. Give your health care provider  a list of all the medicines, herbs, non-prescription drugs, or dietary supplements you use. Also tell them if you smoke, drink alcohol, or use illegal drugs. Some items may interact with your medicine. What should I watch for while using this medicine? This drug may make you feel generally unwell. This is not uncommon, as chemotherapy can affect healthy cells as well as cancer cells. Report any side effects. Continue your course of treatment even though you feel ill unless your doctor tells you to stop. You may need blood work done while you are taking this medicine. This medicine will cause constipation. Try to have a bowel movement at least every 2 to 3 days. If you do not have a bowel movement for 3 days, call your doctor or health care professional. In some cases, you may be given additional medicines to help with side effects. Follow all directions for their use. Do not become pregnant while taking this medicine. Women should inform their doctor if they wish to become pregnant or think they might be pregnant. There is a potential for serious side effects to an unborn child. Talk to your health care professional or pharmacist for more information. Do not breast-feed an infant while taking this medicine. This medicine may make it more difficult to get pregnant  or to father a child. Talk to your healthcare professional if you are concerned about your fertility. What side effects may I notice from receiving this medicine? Side effects that you should report to your doctor or health care professional as soon as possible:  allergic reactions like skin rash, itching or hives, swelling of the face, lips, or tongue  breathing problems  confusion or changes in emotions or moods  constipation  cough  mouth sores  muscle weakness  nausea and vomiting  pain, swelling, redness or irritation at the injection site  pain, tingling, numbness in the hands or feet  problems with balance, talking,  walking  seizures  stomach pain  trouble passing urine or change in the amount of urine Side effects that usually do not require medical attention (report to your doctor or health care professional if they continue or are bothersome):  diarrhea  hair loss  jaw pain  loss of appetite This list may not describe all possible side effects. Call your doctor for medical advice about side effects. You may report side effects to FDA at 1-800-FDA-1088. Where should I keep my medicine? This drug is given in a hospital or clinic and will not be stored at home. NOTE: This sheet is a summary. It may not cover all possible information. If you have questions about this medicine, talk to your doctor, pharmacist, or health care provider.  2020 [employer]/Gold Standard (2017-10-22 08:55:02) Obinutuzumab injection What is this medicine? OBINUTUZUMAB (OH bi nue TOOZ ue mab) is a monoclonal antibody. It is used to treat chronic lymphocytic leukemia (CLL) and a type of non-Hodgkin lymphoma (NHL), follicular lymphoma. This medicine may be used for other purposes; ask your health care provider or pharmacist if you have questions. COMMON BRAND NAME(S): GAZYVA What should I tell my health care provider before I take this medicine? They need to know if you have any of these conditions:  infection (especially a virus infection such as hepatitis B virus)  lung or breathing disease  heart disease  take medicines that treat or prevent blood clots  an unusual or allergic reaction to obinutuzumab, other medicines, foods, dyes, or preservatives  pregnant or trying to get pregnant  breast-feeding How should I use this medicine? This medicine is for infusion into a vein. It is given by a health care professional in a hospital or clinic setting. Talk to your pediatrician regarding the use of this medicine in children. Special care may be needed. Overdosage: If you think you have taken too much of this  medicine contact a poison control center or emergency room at once. NOTE: This medicine is only for you. Do not share this medicine with others. What if I miss a dose? Keep appointments for follow-up doses as directed. It is important not to miss your dose. Call your doctor or health care professional if you are unable to keep an appointment. What may interact with this medicine?  live virus vaccines This list may not describe all possible interactions. Give your health care provider a list of all the medicines, herbs, non-prescription drugs, or dietary supplements you use. Also tell them if you smoke, drink alcohol, or use illegal drugs. Some items may interact with your medicine. What should I watch for while using this medicine? Report any side effects that you notice during your treatment right away, such as changes in your breathing, fever, chills, dizziness or lightheadedness. These effects are more common with the first dose. Visit your prescriber or health care  professional for checks on your progress. You will need to have regular blood work. Report any other side effects. The side effects of this medicine can continue after you finish your treatment. Continue your course of treatment even though you feel ill unless your doctor tells you to stop. Call your doctor or health care professional for advice if you get a fever, chills or sore throat, or other symptoms of a cold or flu. Do not treat yourself. This drug decreases your body's ability to fight infections. Try to avoid being around people who are sick. This medicine may increase your risk to bruise or bleed. Call your doctor or health care professional if you notice any unusual bleeding. Do not become pregnant while taking this medicine or for 6 months after stopping it. Women should inform their doctor if they wish to become pregnant or think they might be pregnant. There is a potential for serious side effects to an unborn child. Talk to  your health care professional or pharmacist for more information. Do not breast-feed an infant while taking this medicine or for 6 months after stopping it. What side effects may I notice from receiving this medicine? Side effects that you should report to your doctor or health care professional as soon as possible:  allergic reactions like skin rash, itching or hives, swelling of the face, lips, or tongue  breathing problems  changes in vision  chest pain or chest tightness  confusion  dizziness  loss of balance or coordination  low blood counts - this medicine may decrease the number of white blood cells, red blood cells and platelets. You may be at increased risk for infections and bleeding.  signs of decreased platelets or bleeding - bruising, pinpoint red spots on the skin, black, tarry stools, blood in the urine  signs of infection - fever or chills, cough, sore throat, pain or trouble passing urine  signs and symptoms of liver injury like dark yellow or brown urine; general ill feeling or flu-like symptoms; light-colored stools; loss of appetite; nausea; right upper belly pain; unusually weak or tired; yellowing of the eyes or skin  trouble speaking or understanding  trouble walking  vomiting Side effects that usually do not require medical attention (report to your doctor or health care professional if they continue or are bothersome):  constipation  joint pain  muscle pain This list may not describe all possible side effects. Call your doctor for medical advice about side effects. You may report side effects to FDA at 1-800-FDA-1088. Where should I keep my medicine? This drug is only given in a hospital or clinic and will not be stored at home. NOTE: This sheet is a summary. It may not cover all possible information. If you have questions about this medicine, talk to your doctor, pharmacist, or health care provider.  2020 [employer]/Gold Standard (2018-05-03  15:34:53)

## 2020-01-30 NOTE — Progress Notes (Signed)
Hematology and Oncology Follow Up Visit  James Jennings [id number] Mar 30, 1970 49 y.o. 01/30/2020   Principle Diagnosis:  Follicular B- cell non-Hodgkin's lymphoma - Relapsed  Past Therapy:             Rituxan/bendamustine-s/p cycle 6 - completed on 07/03/2016  Current Therapy: Gazyva  - Cytoxan/ Vincristine/Prednisone  - started 11/07/2019, s/p cycle #3    Interim History:  James Jennings is here today for follow-up.  Overall, he is doing quite nicely.  Had a very nice Christmas.  He was with his family.  He saw other family.  It was spread out over about 3 days.  He is still works from home.  He is trying to lose weight.  This is been quite difficult for him.  He has a stationary bike that he uses.  He has had no problems with cough or shortness of breath.  There has been some muscle cramps.  I suspect this probably is from the vincristine.  I told him to try some Ativan if the cramps began.  This might help, particularly with the jaw.  He has had no problems with bowels or bladder.  There is been no diarrhea.  He has had no fever.  He has had no cough or shortness of breath.  He has had no mouth sores.  He has had no rashes.  Overall, I would say his performance status is ECOG 0.    Medications:  Allergies as of 01/30/2020      Reactions   Mango Flavor Swelling, Other (See Comments)   LIPS SWELL   Shellfish Allergy Anaphylaxis   Lactose Intolerance (gi) Diarrhea   Rituximab Other (See Comments)      Medication List       Accurate as of January 30, 2020  8:25 AM. If you have any questions, ask your nurse or doctor.        STOP taking these medications   COMPAZINE PO Stopped by: Josph Macho, MD     TAKE these medications   acetaminophen 325 MG tablet Commonly known as: TYLENOL Take 650 mg by mouth every 6 (six) hours as needed for moderate pain or headache.   amoxicillin-clavulanate 875-125 MG tablet Commonly known as: AUGMENTIN 1 tablet   atorvastatin 80 MG  tablet Commonly known as: LIPITOR Take 80 mg by mouth daily.   cetirizine 10 MG tablet Commonly known as: ZYRTEC Take 10 mg by mouth daily as needed for allergies.   gabapentin 300 MG capsule Commonly known as: Neurontin Take 1 capsule (300 mg total) by mouth at bedtime.   ibuprofen 200 MG tablet Commonly known as: ADVIL Take 600 mg by mouth at bedtime.   lidocaine-prilocaine cream Commonly known as: EMLA Apply 1 application topically as needed. Apply to [facility] 1 hour prior to procedure.   metFORMIN 500 MG tablet Commonly known as: GLUCOPHAGE Take 1 tablet (500 mg total) by mouth daily with breakfast.   montelukast 10 MG tablet Commonly known as: SINGULAIR TAKE 1 TABLET BY MOUTH AT BEDTIME. START 3 DAYS BEFORE CHEMO AND TAKE FOR 30 DAYS What changed: See the new instructions.   multivitamin Tabs tablet Take 1 tablet by mouth daily.   ondansetron 8 MG tablet Commonly known as: ZOFRAN Take 1 tablet (8 mg total) by mouth every 8 (eight) hours as needed for nausea or vomiting.   prochlorperazine 10 MG tablet Commonly known as: COMPAZINE Take 1 tablet (10 mg total) by mouth every 6 (six) hours as needed for  nausea or vomiting.       Allergies:  Allergies  Allergen Reactions  . Mango Flavor Swelling and Other (See Comments)    LIPS SWELL  . Shellfish Allergy Anaphylaxis  . Lactose Intolerance (Gi) Diarrhea  . Rituximab Other (See Comments)    Past Medical History, Surgical history, Social history, and Family History were reviewed and updated.  Review of Systems: Review of Systems  Constitutional: Negative.   HENT: Negative.   Eyes: Negative.   Respiratory: Negative.   Cardiovascular: Negative.   Gastrointestinal: Negative.   Genitourinary: Negative.   Musculoskeletal: Negative.   Skin: Negative.   Neurological: Negative.   Endo/Heme/Allergies: Negative.   Psychiatric/Behavioral: Negative.      Physical Exam:  weight is 281 lb 8 oz (127.7 kg). His oral  temperature is 98.1 F (36.7 C). His blood pressure is 127/71 and his pulse is 89. His respiration is 18 and oxygen saturation is 96%.   Wt Readings from Last 3 Encounters:  01/30/20 281 lb 8 oz (127.7 kg)  01/03/20 281 lb (127.5 kg)  12/05/19 278 lb 12 oz (126.4 kg)    Physical Exam Vitals reviewed.  HENT:     Head: Normocephalic and atraumatic.  Eyes:     Pupils: Pupils are equal, round, and reactive to light.  Cardiovascular:     Rate and Rhythm: Normal rate and regular rhythm.     Heart sounds: Normal heart sounds.  Pulmonary:     Effort: Pulmonary effort is normal.     Breath sounds: Normal breath sounds.  Abdominal:     General: Bowel sounds are normal.     Palpations: Abdomen is soft.  Musculoskeletal:        General: No tenderness or deformity. Normal range of motion.     Cervical back: Normal range of motion.  Lymphadenopathy:     Cervical: No cervical adenopathy.  Skin:    General: Skin is warm and dry.     Findings: No erythema or rash.  Neurological:     Mental Status: He is alert and oriented to person, place, and time.  Psychiatric:        Behavior: Behavior normal.        Thought Content: Thought content normal.        Judgment: Judgment normal.      Lab Results  Component Value Date   WBC 4.8 01/30/2020   HGB 13.4 01/30/2020   HCT 39.0 01/30/2020   MCV 90.5 01/30/2020   PLT 200 01/30/2020   No results found for: FERRITIN, IRON, TIBC, UIBC, IRONPCTSAT Lab Results  Component Value Date   RETICCTPCT 1.07 12/06/2015   RBC 4.31 01/30/2020   RETICCTABS 48.79 12/06/2015   No results found for: [facility] Lab Results  Component Value Date   IGGSERUM 785 06/04/2016   IGMSERUM 52 06/04/2016   Lab Results  Component Value Date   TOTALPROTELP 6.5 10/31/2019   ALBUMINELP 3.7 10/31/2019   A1GS 0.2 10/31/2019   A2GS 0.7 10/31/2019   BETS 1.1 10/31/2019   GAMS 0.8 10/31/2019   MSPIKE Not Observed 10/31/2019      Chemistry      Component Value Date/Time   NA 139 01/03/2020 0752   NA 143 08/10/2018 1604   NA 141 12/03/2016 1155   NA 141 02/07/2016 1149   K 4.2 01/03/2020 0752   K 3.8 12/03/2016 1155   K 4.3 02/07/2016 1149   CL 105 01/03/2020 0752   CL 100 12/03/2016 1155  CO2 28 01/03/2020 0752   CO2 27 12/03/2016 1155   CO2 27 02/07/2016 1149   BUN 19 01/03/2020 0752   BUN 12 08/10/2018 1604   BUN 10 12/03/2016 1155   BUN 10.9 02/07/2016 1149   CREATININE 0.99 01/03/2020 0752   CREATININE 1.0 12/03/2016 1155   CREATININE 0.9 02/07/2016 1149      Component Value Date/Time   CALCIUM 9.2 01/03/2020 0752   CALCIUM 9.1 12/03/2016 1155   CALCIUM 9.6 02/07/2016 1149   ALKPHOS 103 01/03/2020 0752   ALKPHOS 99 (H) 12/03/2016 1155   ALKPHOS 127 02/07/2016 1149   AST 22 01/03/2020 0752   AST 17 02/07/2016 1149   ALT 24 01/03/2020 0752   ALT 24 12/03/2016 1155   ALT 16 02/07/2016 1149   BILITOT 0.6 01/03/2020 0752   BILITOT 0.42 02/07/2016 1149       Impression and Plan: James Jennings a pleasant 49 yo caucasian gentleman with history of follicular large cell non-Hodgkin's lymphoma. He completed 6 cycles of chemotherapy with bendamustine on 07/03/2016 but was not tolerant of Rituxan (anaphylaxis). His lymphoma has now recurred.   At this point, we will proceed with his 4th cycle of treatment.  After his fourth cycle, we will have to do another PET scan on him.  I think if the PET scan is still active that time, we will going to have to make a change with protocol and probably throw in Adriamycin.  We will plan to get him back in another 4 weeks.   [name], MD 12/28/20218:25 AM

## 2020-01-31 ENCOUNTER — Other Ambulatory Visit: Payer: Self-pay | Admitting: *Deleted

## 2020-01-31 LAB — BETA 2 MICROGLOBULIN, SERUM: Beta-2 Microglobulin: 1.4 mg/L (ref 0.6–2.4)

## 2020-01-31 MED ORDER — ONDANSETRON HCL 8 MG PO TABS: 8.0000 mg | ORAL_TABLET | Freq: Three times a day (TID) | ORAL | 0 refills | Status: DC | PRN

## 2020-01-31 MED ORDER — LORAZEPAM 0.5 MG PO TABS: 0.5000 mg | ORAL_TABLET | Freq: Four times a day (QID) | ORAL | 0 refills | Status: DC | PRN

## 2020-02-07 ENCOUNTER — Telehealth: Payer: Self-pay

## 2020-02-07 NOTE — Telephone Encounter (Signed)
Per 02/07/20 inbasket from tara, pts new ins will not cover the PET, called and lm with this information and called [facility] to cancel... aom

## 2020-02-09 ENCOUNTER — Encounter (HOSPITAL_COMMUNITY): Payer: BC Managed Care – PPO

## 2020-02-14 ENCOUNTER — Telehealth: Payer: Self-pay

## 2020-02-14 NOTE — Telephone Encounter (Signed)
Per tara secure chat, pt called regarding PET appt, per Baxter Flattery this is approved, appt made and pt aware    aom

## 2020-02-23 ENCOUNTER — Ambulatory Visit (HOSPITAL_COMMUNITY)
Admission: RE | Admit: 2020-02-23 | Discharge: 2020-02-23 | Disposition: A | Discharge status: Home / Self Care | Payer: BC Managed Care – PPO | Source: Ambulatory Visit | Attending: Hematology & Oncology | Admitting: Hematology & Oncology

## 2020-02-23 ENCOUNTER — Other Ambulatory Visit: Payer: Self-pay

## 2020-02-23 DIAGNOSIS — C8223 Follicular lymphoma grade III, unspecified, intra-abdominal lymph nodes: Secondary | ICD-10-CM | POA: Diagnosis not present

## 2020-02-23 DIAGNOSIS — C8290 Follicular lymphoma, unspecified, unspecified site: Secondary | ICD-10-CM | POA: Diagnosis not present

## 2020-02-23 LAB — GLUCOSE, CAPILLARY: Glucose-Capillary: 186 mg/dL — ABNORMAL HIGH (ref 70–99)

## 2020-02-23 MED ORDER — FLUDEOXYGLUCOSE F - 18 (FDG) INJECTION
14.0000 | Freq: Once | INTRAVENOUS | Status: AC | PRN
  Administered 2020-02-23: 14 via INTRAVENOUS

## 2020-02-24 ENCOUNTER — Other Ambulatory Visit: Payer: Self-pay | Admitting: Hematology & Oncology

## 2020-02-27 ENCOUNTER — Inpatient Hospital Stay: Payer: BC Managed Care – PPO

## 2020-02-27 ENCOUNTER — Inpatient Hospital Stay: Payer: BC Managed Care – PPO | Attending: Hematology & Oncology

## 2020-02-27 ENCOUNTER — Telehealth: Payer: Self-pay

## 2020-02-27 ENCOUNTER — Other Ambulatory Visit: Payer: Self-pay

## 2020-02-27 ENCOUNTER — Inpatient Hospital Stay (HOSPITAL_BASED_OUTPATIENT_CLINIC_OR_DEPARTMENT_OTHER): Payer: BC Managed Care – PPO | Admitting: Hematology & Oncology

## 2020-02-27 VITALS — BP 128/75 | HR 84 | Temp 98.4°F | Resp 18

## 2020-02-27 DIAGNOSIS — C8223 Follicular lymphoma grade III, unspecified, intra-abdominal lymph nodes: Secondary | ICD-10-CM | POA: Diagnosis not present

## 2020-02-27 DIAGNOSIS — Z5111 Encounter for antineoplastic chemotherapy: Secondary | ICD-10-CM | POA: Insufficient documentation

## 2020-02-27 DIAGNOSIS — Z5112 Encounter for antineoplastic immunotherapy: Secondary | ICD-10-CM | POA: Diagnosis not present

## 2020-02-27 DIAGNOSIS — Z79899 Other long term (current) drug therapy: Secondary | ICD-10-CM | POA: Insufficient documentation

## 2020-02-27 DIAGNOSIS — Z7984 Long term (current) use of oral hypoglycemic drugs: Secondary | ICD-10-CM | POA: Insufficient documentation

## 2020-02-27 LAB — CBC WITH DIFFERENTIAL (CANCER CENTER ONLY)
Abs Immature Granulocytes: 0.02 10*3/uL (ref 0.00–0.07)
Basophils Absolute: 0 10*3/uL (ref 0.0–0.1)
Basophils Relative: 1 %
Eosinophils Absolute: 0.2 10*3/uL (ref 0.0–0.5)
Eosinophils Relative: 3 %
HCT: 36.8 % — ABNORMAL LOW (ref 39.0–52.0)
Hemoglobin: 12.9 g/dL — ABNORMAL LOW (ref 13.0–17.0)
Immature Granulocytes: 0 %
Lymphocytes Relative: 13 %
Lymphs Abs: 0.6 10*3/uL — ABNORMAL LOW (ref 0.7–4.0)
MCH: 31.5 pg (ref 26.0–34.0)
MCHC: 35.1 g/dL (ref 30.0–36.0)
MCV: 90 fL (ref 80.0–100.0)
Monocytes Absolute: 0.5 10*3/uL (ref 0.1–1.0)
Monocytes Relative: 11 %
Neutro Abs: 3.4 10*3/uL (ref 1.7–7.7)
Neutrophils Relative %: 72 %
Platelet Count: 192 10*3/uL (ref 150–400)
RBC: 4.09 MIL/uL — ABNORMAL LOW (ref 4.22–5.81)
RDW: 13 % (ref 11.5–15.5)
WBC Count: 4.7 10*3/uL (ref 4.0–10.5)
nRBC: 0 % (ref 0.0–0.2)

## 2020-02-27 LAB — LACTATE DEHYDROGENASE: LDH: 150 U/L (ref 98–192)

## 2020-02-27 LAB — CMP (CANCER CENTER ONLY)
ALT: 27 U/L (ref 0–44)
AST: 24 U/L (ref 15–41)
Albumin: 3.8 g/dL (ref 3.5–5.0)
Alkaline Phosphatase: 107 U/L (ref 38–126)
Anion gap: 9 (ref 5–15)
BUN: 15 mg/dL (ref 6–20)
CO2: 26 mmol/L (ref 22–32)
Calcium: 8.9 mg/dL (ref 8.9–10.3)
Chloride: 104 mmol/L (ref 98–111)
Creatinine: 1.02 mg/dL (ref 0.61–1.24)
GFR, Estimated: >60 mL/min (ref >60)
Glucose, Bld: 212 mg/dL — ABNORMAL HIGH (ref 70–99)
Potassium: 4 mmol/L (ref 3.5–5.1)
Sodium: 139 mmol/L (ref 135–145)
Total Bilirubin: 0.3 mg/dL (ref 0.3–1.2)
Total Protein: 6.7 g/dL (ref 6.5–8.1)

## 2020-02-27 MED ORDER — SODIUM CHLORIDE 0.9 % IV SOLN
20.0000 mg | Freq: Once | INTRAVENOUS | Status: AC
  Administered 2020-02-27: 20 mg via INTRAVENOUS
  Filled 2020-02-27: qty 20

## 2020-02-27 MED ORDER — SODIUM CHLORIDE 0.9% FLUSH
10.0000 mL | Freq: Once | INTRAVENOUS | Status: AC
  Administered 2020-02-27: 10 mL
  Filled 2020-02-27: qty 10

## 2020-02-27 MED ORDER — DIPHENHYDRAMINE HCL 50 MG/ML IJ SOLN
INTRAMUSCULAR | Status: AC
  Filled 2020-02-27: qty 1

## 2020-02-27 MED ORDER — SODIUM CHLORIDE 0.9 % IV SOLN
1000.0000 mg | Freq: Once | INTRAVENOUS | Status: AC
  Administered 2020-02-27: 1000 mg via INTRAVENOUS
  Filled 2020-02-27: qty 40

## 2020-02-27 MED ORDER — HEPARIN SOD (PORK) LOCK FLUSH 100 UNIT/ML IV SOLN
500.0000 [IU] | Freq: Once | INTRAVENOUS | Status: AC | PRN
  Administered 2020-02-27: 500 [IU]
  Filled 2020-02-27: qty 5

## 2020-02-27 MED ORDER — SODIUM CHLORIDE 0.9 % IV SOLN
16.0000 mg | Freq: Once | INTRAVENOUS | Status: AC
  Administered 2020-02-27: 16 mg via INTRAVENOUS
  Filled 2020-02-27: qty 8

## 2020-02-27 MED ORDER — VINCRISTINE SULFATE CHEMO INJECTION 1 MG/ML
2.0000 mg | Freq: Once | INTRAVENOUS | Status: AC
  Administered 2020-02-27: 2 mg via INTRAVENOUS
  Filled 2020-02-27: qty 2

## 2020-02-27 MED ORDER — SODIUM CHLORIDE 0.9 % IV SOLN
40.0000 mg | Freq: Once | INTRAVENOUS | Status: AC
  Administered 2020-02-27: 40 mg via INTRAVENOUS
  Filled 2020-02-27: qty 4

## 2020-02-27 MED ORDER — SODIUM CHLORIDE 0.9 % IV SOLN
Freq: Once | INTRAVENOUS | Status: AC
  Filled 2020-02-27: qty 250

## 2020-02-27 MED ORDER — SODIUM CHLORIDE 0.9 % IV SOLN: 10.0000 mg | Freq: Once | INTRAVENOUS | Status: DC

## 2020-02-27 MED ORDER — SODIUM CHLORIDE 0.9% FLUSH
10.0000 mL | INTRAVENOUS | Status: DC | PRN
  Administered 2020-02-27: 10 mL
  Filled 2020-02-27: qty 10

## 2020-02-27 MED ORDER — ACETAMINOPHEN 325 MG PO TABS
ORAL_TABLET | ORAL | Status: AC
  Filled 2020-02-27: qty 2

## 2020-02-27 MED ORDER — SODIUM CHLORIDE 0.9 % IV SOLN
790.0000 mg/m2 | Freq: Once | INTRAVENOUS | Status: AC
  Administered 2020-02-27: 2000 mg via INTRAVENOUS
  Filled 2020-02-27: qty 100

## 2020-02-27 MED ORDER — DIPHENHYDRAMINE HCL 50 MG/ML IJ SOLN
12.5000 mg | Freq: Once | INTRAMUSCULAR | Status: AC
  Administered 2020-02-27: 12.5 mg via INTRAVENOUS

## 2020-02-27 MED ORDER — ACETAMINOPHEN 325 MG PO TABS
650.0000 mg | ORAL_TABLET | Freq: Once | ORAL | Status: AC
  Administered 2020-02-27: 650 mg via ORAL

## 2020-02-27 NOTE — Telephone Encounter (Signed)
appts made per 02/27/20 los and pt will rec appts in tx/avs     James Jennings

## 2020-02-27 NOTE — Progress Notes (Signed)
Hematology and Oncology Follow Up Visit  Gaspard Wilder [id number] Feb 26, 1970 50 y.o. 02/27/2020   Principle Diagnosis:  Follicular B- cell non-Hodgkin's lymphoma - Relapsed  Past Therapy:             Rituxan/bendamustine-s/p cycle 6 - completed on 07/03/2016  Current Therapy: Gazyva  - Cytoxan/ Vincristine/Prednisone  - started 11/07/2019, s/p cycle #4   Interim History:  Mr. Indelicato is here today for follow-up.  Everything seemed to go pretty well with him.  The problem that we might have is that with his last PET scan, there is still activity in the left inguinal lymph node.  It had an SUV of 7.3.  The lymph node measured 2.1 cm.  We are going to have to try to get this lymph node removed and see if it has transformed to a more high-grade lymphoma.  He has had no problems with nausea or vomiting.  He has had no fever.  There are no rashes.  He has had no problems with bowels or bladder.  He has had no leg swelling.  He is trying to lose weight.  He is trying to exercise.  Unfortunately, this is a busy time of year for his work.  His family is doing well which is nice to see.  Overall, his performance status is ECOG 0.    Medications:  Allergies as of 02/27/2020      Reactions   Mango Flavor Swelling, Other (See Comments)   LIPS SWELL   Shellfish Allergy Anaphylaxis   Lactose Intolerance (gi) Diarrhea   Rituximab Other (See Comments)      Medication List       Accurate as of February 27, 2020  9:39 AM. If you have any questions, ask your nurse or doctor.        STOP taking these medications   amoxicillin-clavulanate 875-125 MG tablet Commonly known as: AUGMENTIN     TAKE these medications   acetaminophen 325 MG tablet Commonly known as: TYLENOL Take 650 mg by mouth every 6 (six) hours as needed for moderate pain or headache.   atorvastatin 80 MG tablet Commonly known as: LIPITOR Take 80 mg by mouth daily.   cetirizine 10 MG tablet Commonly known as: ZYRTEC Take  10 mg by mouth daily as needed for allergies.   gabapentin 300 MG capsule Commonly known as: Neurontin Take 1 capsule (300 mg total) by mouth at bedtime.   ibuprofen 200 MG tablet Commonly known as: ADVIL Take 600 mg by mouth at bedtime.   lidocaine-prilocaine cream Commonly known as: EMLA Apply 1 application topically as needed. Apply to [facility] 1 hour prior to procedure.   LORazepam 0.5 MG tablet Commonly known as: ATIVAN Take 1 tablet (0.5 mg total) by mouth every 6 (six) hours as needed for anxiety.   metFORMIN 500 MG tablet Commonly known as: GLUCOPHAGE Take 1 tablet (500 mg total) by mouth daily with breakfast.   montelukast 10 MG tablet Commonly known as: SINGULAIR TAKE 1 TABLET BY MOUTH AT BEDTIME. START 3 DAYS BEFORE CHEMO AND TAKE FOR 30 DAYS   multivitamin Tabs tablet Take 1 tablet by mouth daily.   ondansetron 8 MG tablet Commonly known as: ZOFRAN Take 1 tablet (8 mg total) by mouth every 8 (eight) hours as needed for nausea or vomiting.   prochlorperazine 10 MG tablet Commonly known as: COMPAZINE Take 1 tablet (10 mg total) by mouth every 6 (six) hours as needed for nausea or vomiting.  Allergies:  Allergies  Allergen Reactions  . Mango Flavor Swelling and Other (See Comments)    LIPS SWELL  . Shellfish Allergy Anaphylaxis  . Lactose Intolerance (Gi) Diarrhea  . Rituximab Other (See Comments)    Past Medical History, Surgical history, Social history, and Family History were reviewed and updated.  Review of Systems: Review of Systems  Constitutional: Negative.   HENT: Negative.   Eyes: Negative.   Respiratory: Negative.   Cardiovascular: Negative.   Gastrointestinal: Negative.   Genitourinary: Negative.   Musculoskeletal: Negative.   Skin: Negative.   Neurological: Negative.   Endo/Heme/Allergies: Negative.   Psychiatric/Behavioral: Negative.      Physical Exam:  vitals were not taken for this visit.   Wt Readings from Last 3  Encounters:  01/30/20 281 lb 8 oz (127.7 kg)  01/03/20 281 lb (127.5 kg)  12/05/19 278 lb 12 oz (126.4 kg)    Physical Exam Vitals reviewed.  HENT:     Head: Normocephalic and atraumatic.  Eyes:     Pupils: Pupils are equal, round, and reactive to light.  Cardiovascular:     Rate and Rhythm: Normal rate and regular rhythm.     Heart sounds: Normal heart sounds.  Pulmonary:     Effort: Pulmonary effort is normal.     Breath sounds: Normal breath sounds.  Abdominal:     General: Bowel sounds are normal.     Palpations: Abdomen is soft.  Musculoskeletal:        General: No tenderness or deformity. Normal range of motion.     Cervical back: Normal range of motion.  Lymphadenopathy:     Cervical: No cervical adenopathy.  Skin:    General: Skin is warm and dry.     Findings: No erythema or rash.  Neurological:     Mental Status: He is alert and oriented to person, place, and time.  Psychiatric:        Behavior: Behavior normal.        Thought Content: Thought content normal.        Judgment: Judgment normal.      Lab Results  Component Value Date   WBC 4.7 02/27/2020   HGB 12.9 (L) 02/27/2020   HCT 36.8 (L) 02/27/2020   MCV 90.0 02/27/2020   PLT 192 02/27/2020   No results found for: FERRITIN, IRON, TIBC, UIBC, IRONPCTSAT Lab Results  Component Value Date   RETICCTPCT 1.07 12/06/2015   RBC 4.09 (L) 02/27/2020   RETICCTABS 48.79 12/06/2015   No results found for: [facility] P[facility] Lab Results  Component Value Date   IGGSERUM 785 06/04/2016   IGMSERUM 52 06/04/2016   Lab Results  Component Value Date   TOTALPROTELP 6.5 10/31/2019   ALBUMINELP 3.7 10/31/2019   A1GS 0.2 10/31/2019   A2GS 0.7 10/31/2019   BETS 1.1 10/31/2019   GAMS 0.8 10/31/2019   MSPIKE Not Observed 10/31/2019     Chemistry      Component Value Date/Time   NA 139 01/30/2020 0800   NA 143 08/10/2018 1604   NA 141 12/03/2016 1155   NA 141 02/07/2016 1149   K 4.4  01/30/2020 0800   K 3.8 12/03/2016 1155   K 4.3 02/07/2016 1149   CL 104 01/30/2020 0800   CL 100 12/03/2016 1155   CO2 27 01/30/2020 0800   CO2 27 12/03/2016 1155   CO2 27 02/07/2016 1149   BUN 15 01/30/2020 0800   BUN 12 08/10/2018 1604   BUN 10  12/03/2016 1155   BUN 10.9 02/07/2016 1149   CREATININE 1.09 01/30/2020 0800   CREATININE 1.0 12/03/2016 1155   CREATININE 0.9 02/07/2016 1149      Component Value Date/Time   CALCIUM 9.4 01/30/2020 0800   CALCIUM 9.1 12/03/2016 1155   CALCIUM 9.6 02/07/2016 1149   ALKPHOS 100 01/30/2020 0800   ALKPHOS 99 (H) 12/03/2016 1155   ALKPHOS 127 02/07/2016 1149   AST 22 01/30/2020 0800   AST 17 02/07/2016 1149   ALT 28 01/30/2020 0800   ALT 24 12/03/2016 1155   ALT 16 02/07/2016 1149   BILITOT 0.7 01/30/2020 0800   BILITOT 0.42 02/07/2016 1149       Impression and Plan: [name] a pleasant 50 yo caucasian gentleman with history of follicular large cell non-Hodgkin's lymphoma. He completed 6 cycles of chemotherapy with bendamustine on 07/03/2016 but was not tolerant of Rituxan (anaphylaxis). His lymphoma has now recurred.   Again, we have his active lymph node in the left inguinal region.  I will try to get this removed.  I spoke with Dr. Ninfa Linden of general surgery.  He will see Mr. Ingrum and try to get this removed.  It is fairly superficial.  I cannot palpate it but on PET scan, I would think this should be able to be removed.  I will go ahead with 5th cycle of treatment.  He seems to be doing well overall.  He is having no toxicity which is nice.  I will have to get him back now in another month.  By then, we will have the biopsy and then we will have the necessary information in order to figure out how we can proceed.[name]   [name], MD 1/25/20229:39 AM

## 2020-02-27 NOTE — Patient Instructions (Signed)
[facility] Discharge Instructions for Patients Receiving Chemotherapy  Today you received the following chemotherapy agents Gazyva, Cytoxan, Vincristine  To help prevent nausea and vomiting after your treatment, we encourage you to take your nausea medication    If you develop nausea and vomiting that is not controlled by your nausea medication, call the clinic.   BELOW ARE SYMPTOMS THAT SHOULD BE REPORTED IMMEDIATELY:  *FEVER GREATER THAN 100.5 F  *CHILLS WITH OR WITHOUT FEVER  NAUSEA AND VOMITING THAT IS NOT CONTROLLED WITH YOUR NAUSEA MEDICATION  *UNUSUAL SHORTNESS OF BREATH  *UNUSUAL BRUISING OR BLEEDING  TENDERNESS IN MOUTH AND THROAT WITH OR WITHOUT PRESENCE OF ULCERS  *URINARY PROBLEMS  *BOWEL PROBLEMS  UNUSUAL RASH Items with * indicate a potential emergency and should be followed up as soon as possible.  Feel free to call the clinic should you have any questions or concerns. The clinic phone number is [phone].  Please show the CHEMO ALERT CARD at check-in to the Emergency Department and triage nurse.   

## 2020-02-27 NOTE — Patient Instructions (Signed)

## 2020-03-07 ENCOUNTER — Ambulatory Visit: Payer: 59 | Admitting: Hematology & Oncology

## 2020-03-07 ENCOUNTER — Other Ambulatory Visit: Payer: 59

## 2020-03-07 ENCOUNTER — Other Ambulatory Visit: Payer: Self-pay | Admitting: Surgery

## 2020-03-07 DIAGNOSIS — R590 Localized enlarged lymph nodes: Secondary | ICD-10-CM | POA: Diagnosis not present

## 2020-03-14 ENCOUNTER — Ambulatory Visit (INDEPENDENT_AMBULATORY_CARE_PROVIDER_SITE_OTHER): Payer: BC Managed Care – PPO | Admitting: Family Medicine

## 2020-03-14 ENCOUNTER — Other Ambulatory Visit: Payer: Self-pay

## 2020-03-14 ENCOUNTER — Encounter (INDEPENDENT_AMBULATORY_CARE_PROVIDER_SITE_OTHER): Payer: Self-pay | Admitting: Family Medicine

## 2020-03-14 VITALS — BP 108/71 | HR 98 | Temp 97.7°F | Ht 71.0 in | Wt 277.0 lb

## 2020-03-14 DIAGNOSIS — E7849 Other hyperlipidemia: Secondary | ICD-10-CM

## 2020-03-14 DIAGNOSIS — R7303 Prediabetes: Secondary | ICD-10-CM | POA: Diagnosis not present

## 2020-03-14 DIAGNOSIS — R6889 Other general symptoms and signs: Secondary | ICD-10-CM | POA: Diagnosis not present

## 2020-03-14 DIAGNOSIS — E782 Mixed hyperlipidemia: Secondary | ICD-10-CM | POA: Diagnosis not present

## 2020-03-14 DIAGNOSIS — Z0289 Encounter for other administrative examinations: Secondary | ICD-10-CM

## 2020-03-14 DIAGNOSIS — R5383 Other fatigue: Secondary | ICD-10-CM

## 2020-03-14 DIAGNOSIS — R0602 Shortness of breath: Secondary | ICD-10-CM | POA: Diagnosis not present

## 2020-03-14 DIAGNOSIS — E1169 Type 2 diabetes mellitus with other specified complication: Secondary | ICD-10-CM | POA: Diagnosis not present

## 2020-03-14 DIAGNOSIS — E559 Vitamin D deficiency, unspecified: Secondary | ICD-10-CM | POA: Diagnosis not present

## 2020-03-14 DIAGNOSIS — Z1331 Encounter for screening for depression: Secondary | ICD-10-CM | POA: Diagnosis not present

## 2020-03-14 DIAGNOSIS — Z6838 Body mass index (BMI) 38.0-38.9, adult: Secondary | ICD-10-CM

## 2020-03-15 LAB — COMPREHENSIVE METABOLIC PANEL
ALT: 30 IU/L (ref 0–44)
AST: 25 IU/L (ref 0–40)
Albumin/Globulin Ratio: 2 (ref 1.2–2.2)
Albumin: 4.3 g/dL (ref 3.8–4.9)
Alkaline Phosphatase: 125 IU/L — ABNORMAL HIGH (ref 44–121)
BUN/Creatinine Ratio: 16 (ref 9–20)
BUN: 15 mg/dL (ref 6–24)
Bilirubin Total: 0.4 mg/dL (ref 0.0–1.2)
CO2: 22 mmol/L (ref 20–29)
Calcium: 9.2 mg/dL (ref 8.7–10.2)
Chloride: 102 mmol/L (ref 96–106)
Creatinine, Ser: 0.95 mg/dL (ref 0.76–1.27)
GFR calc Af Amer: 107 mL/min/{1.73_m2} (ref >59)
GFR calc non Af Amer: 92 mL/min/{1.73_m2} (ref >59)
Globulin, Total: 2.1 g/dL (ref 1.5–4.5)
Glucose: 185 mg/dL — ABNORMAL HIGH (ref 65–99)
Potassium: 4.6 mmol/L (ref 3.5–5.2)
Sodium: 138 mmol/L (ref 134–144)
Total Protein: 6.4 g/dL (ref 6.0–8.5)

## 2020-03-15 LAB — T4: T4, Total: 7.7 ug/dL (ref 4.5–12.0)

## 2020-03-15 LAB — TSH: TSH: 2.96 u[IU]/mL (ref 0.450–4.500)

## 2020-03-15 LAB — T3: T3, Total: 148 ng/dL (ref 71–180)

## 2020-03-15 LAB — HEMOGLOBIN A1C
Est. average glucose Bld gHb Est-mCnc: 189 mg/dL
Hgb A1c MFr Bld: 8.2 % — ABNORMAL HIGH (ref 4.8–5.6)

## 2020-03-15 LAB — LIPID PANEL WITH LDL/HDL RATIO
Cholesterol, Total: 166 mg/dL (ref 100–199)
HDL: 39 mg/dL — ABNORMAL LOW (ref >39)
LDL Chol Calc (NIH): 99 mg/dL (ref 0–99)
LDL/HDL Ratio: 2.5 ratio (ref 0.0–3.6)
Triglycerides: 160 mg/dL — ABNORMAL HIGH (ref 0–149)
VLDL Cholesterol Cal: 28 mg/dL (ref 5–40)

## 2020-03-15 LAB — VITAMIN B12: Vitamin B-12: 846 pg/mL (ref 232–1245)

## 2020-03-15 LAB — FOLATE: Folate: >20 ng/mL (ref >3.0)

## 2020-03-15 LAB — INSULIN, RANDOM: INSULIN: 28 u[IU]/mL — ABNORMAL HIGH (ref 2.6–24.9)

## 2020-03-15 LAB — VITAMIN D 25 HYDROXY (VIT D DEFICIENCY, FRACTURES): Vit D, 25-Hydroxy: 41.3 ng/mL (ref 30.0–100.0)

## 2020-03-16 ENCOUNTER — Other Ambulatory Visit (HOSPITAL_COMMUNITY)
Admission: RE | Admit: 2020-03-16 | Discharge: 2020-03-16 | Disposition: A | Discharge status: Home / Self Care | Payer: BC Managed Care – PPO | Source: Ambulatory Visit | Attending: Surgery | Admitting: Surgery

## 2020-03-16 DIAGNOSIS — Z01812 Encounter for preprocedural laboratory examination: Secondary | ICD-10-CM | POA: Diagnosis not present

## 2020-03-16 DIAGNOSIS — Z20822 Contact with and (suspected) exposure to covid-19: Secondary | ICD-10-CM | POA: Diagnosis not present

## 2020-03-17 LAB — SARS CORONAVIRUS 2 (TAT 6-24 HRS): SARS Coronavirus 2: NEGATIVE

## 2020-03-18 NOTE — Progress Notes (Unsigned)
Chief Complaint:   OBESITY James Jennings (MR# [id number]) is a 50 y.o. male who presents for evaluation and treatment of obesity and related comorbidities. Current BMI is Body mass index is 38.63 kg/m. James Jennings has been struggling with his weight for many years and has been unsuccessful in either losing weight, maintaining weight loss, or reaching his healthy weight goal.  James Jennings is returning to our program after being away for >6 months. He is back in the action stage of change and he is ready to work on weight loss to improve his health.  James Jennings is currently in the action stage of change and ready to dedicate time achieving and maintaining a healthier weight. James Jennings is interested in becoming our patient and working on intensive lifestyle modifications including (but not limited to) diet and exercise for weight loss.  James Jennings's habits were reviewed today and are as follows: His family eats meals together, he thinks his family will eat healthier with him, his desired weight loss is 82 lbs, he started gaining weight last year, his heaviest weight ever was 281 pounds, he is frequently drinking liquids with calories, he frequently makes poor food choices and he frequently eats larger portions than normal.  Depression Screen James Jennings (modified PHQ-9) score was 6.  Depression screen PHQ 2/9 03/14/2020  Decreased Interest 1  Down, Depressed, Hopeless 1  PHQ - 2 Score 2  Altered sleeping 0  Tired, decreased energy 2  Change in appetite 0  Feeling bad or failure about yourself  1  Trouble concentrating 0  Moving slowly or fidgety/restless 1  Suicidal thoughts 0  PHQ-9 Score 6  Difficult doing work/chores Not difficult at all   Subjective:   1. Other fatigue James Jennings admits to daytime somnolence and admits to waking up still tired. Patent has a history of symptoms of daytime fatigue. James Jennings generally gets 6 or 8 hours of sleep per night, and states that he has generally restful sleep. Snoring is  present. Apneic episodes are present. Epworth Sleepiness Score is 2.  2. Shortness of breath on exertion James Jennings notes increasing shortness of breath with exercising and seems to be worsening over time with weight gain. He notes getting out of breath sooner with activity than he used to. This has not gotten worse recently. [place] denies shortness of breath at rest or orthopnea.  3. Pre-diabetes James Jennings is on metformin, and he has a history of pre-diabetes. He is undergoing change and recently had a glucose of 212, which may mean he has progressed to diabetes mellitus.  4. Other hyperlipidemia James Jennings is on Lipitor 80 mg, and he is ready to work on weight loss again. He denies chest pain.  Assessment/Plan:   1. Other fatigue Rowan does feel that his weight is causing his energy to be lower than it should be. Fatigue may be related to obesity, depression or many other causes. Labs will be ordered, and in the meanwhile, James Jennings will focus on self care including making healthy food choices, increasing physical activity and focusing on stress reduction.  - EKG 12-Lead - Vitamin B12 - Comprehensive metabolic panel - Folate - T3 - T4 - TSH - VITAMIN D 25 Hydroxy (Vit-D Deficiency, Fractures)  2. Shortness of breath on exertion Hermilo does feel that he gets out of breath more easily that he used to when he exercises. Shakil's shortness of breath appears to be obesity related and exercise induced. He has agreed to work on weight loss and gradually  increase exercise to treat his exercise induced shortness of breath. Will continue to monitor closely.  3. Pre-diabetes James Jennings will start his Category 4 plan, and will continue to work on weight loss, exercise, and decreasing simple carbohydrates to help decrease the risk of diabetes. We will check labs today, and we will refill metformin for 1 month.  - Hemoglobin A1c - Insulin, random - metFORMIN (GLUCOPHAGE) 500 MG tablet; Take 500 mg by mouth daily.  4. Other  hyperlipidemia Cardiovascular risk and specific lipid/LDL goals reviewed. We discussed several lifestyle modifications today. James Jennings will start his Category 4 plan and weight loss efforts. We will check labs today. Orders and follow up as documented in patient record.   - Lipid Panel With LDL/HDL Ratio  5. Screening for depression James Jennings had a positive depression screening. Depression is commonly associated with obesity and often results in emotional eating behaviors. We will monitor this closely and work on CBT to help improve the non-hunger eating patterns. Referral to Psychology may be required if no improvement is seen as he continues in our clinic.  6. Class 2 severe obesity with serious comorbidity and body mass index (BMI) of 38.0 to 38.9 in adult, unspecified obesity type James Jennings) James Jennings is currently in the action stage of change and his goal is to continue with weight loss efforts. I recommend James Jennings begin the structured treatment plan as follows:  He has agreed to the Category 4 Plan.  Exercise goals: No exercise has been prescribed at this time.   Behavioral modification strategies: decreasing eating out, meal planning and cooking strategies and planning for success.  He was informed of the importance of frequent follow-up visits to maximize his success with intensive lifestyle modifications for his multiple health conditions. He was informed we would discuss his lab results at his next visit unless there is a critical issue that needs to be addressed sooner. James Jennings agreed to keep his next visit at the agreed upon time to discuss these results.  Objective:   Blood pressure 108/71, pulse 98, temperature 97.7 F (36.5 C), height 5\' 11"  (1.803 m), weight 277 lb (125.6 kg), SpO2 96 %. Body mass index is 38.63 kg/m.  EKG: Normal sinus rhythm, tachycardia rate 119 BPM.  Indirect Calorimeter completed today shows a VO2 of 422 and a REE of 2940.  His calculated basal metabolic rate is 1610 thus his  basal metabolic rate is better than expected.  General: Cooperative, alert, well developed, in no acute distress. HEENT: Conjunctivae and lids unremarkable. Cardiovascular: Regular rhythm.  Lungs: Normal work of breathing. Neurologic: No focal deficits.   Lab Results  Component Value Date   CREATININE 0.95 03/14/2020   BUN 15 03/14/2020   NA 138 03/14/2020   K 4.6 03/14/2020   CL 102 03/14/2020   CO2 22 03/14/2020   Lab Results  Component Value Date   ALT 30 03/14/2020   AST 25 03/14/2020   ALKPHOS 125 (H) 03/14/2020   BILITOT 0.4 03/14/2020   Lab Results  Component Value Date   HGBA1C 8.2 (H) 03/14/2020   HGBA1C 6.2 (H) 08/10/2018   Lab Results  Component Value Date   INSULIN 28.0 (H) 03/14/2020   INSULIN 24.6 08/10/2018   Lab Results  Component Value Date   TSH 2.960 03/14/2020   Lab Results  Component Value Date   CHOL 166 03/14/2020   HDL 39 (L) 03/14/2020   LDLCALC 99 03/14/2020   TRIG 160 (H) 03/14/2020   Lab Results  Component Value Date  WBC 4.7 02/27/2020   HGB 12.9 (L) 02/27/2020   HCT 36.8 (L) 02/27/2020   MCV 90.0 02/27/2020   PLT 192 02/27/2020   No results found for: IRON, TIBC, FERRITIN  Attestation Statements:   Reviewed by clinician on day of visit: allergies, medications, problem list, medical history, surgical history, family history, social history, and previous encounter notes.  Time spent on visit including pre-visit chart review and post-visit charting and care was 45 minutes.    I, Trixie Dredge, am acting as transcriptionist for Dennard Nip, MD.  I have reviewed the above documentation for accuracy and completeness, and I agree with the above. - Dennard Nip, MD

## 2020-03-19 ENCOUNTER — Other Ambulatory Visit: Payer: Self-pay

## 2020-03-19 ENCOUNTER — Encounter (HOSPITAL_COMMUNITY): Payer: Self-pay | Admitting: Surgery

## 2020-03-19 MED ORDER — DEXTROSE 5 % IV SOLN
3.0000 g | INTRAVENOUS | Status: AC
  Administered 2020-03-20: 3 g via INTRAVENOUS
  Filled 2020-03-19: qty 3

## 2020-03-19 NOTE — Progress Notes (Signed)
Mr. Toner denies chest pain or shortness of breath. Patient tested negative for Covid and has been in quarantine since that time. Mr.Villena has pre-diabetes, he does not check CBG.  I instructed patient to not take Metformin in am.

## 2020-03-19 NOTE — H&P (Signed)
   James Jennings Appointment: 03/07/2020 3:50 PM Location: [facility] Patient #: [id number] DOB: 1970-03-12 Married / Language: English / Race: White Male   History of Present Illness ([name] [name] MD; 03/07/2020 4:01 PM) The patient is a 50 year old male who presents with lymphadenopathy.  Chief complaint: Lymphadenopathy  This is a 50 year old gentleman with a known history of lymphoma who is referred here for consideration of a lymph node biopsy. He was previously diagnosed with lymphoma in 2017 by a right inguinal biopsy by Dr. Redmond Pulling. He is followed closely by his oncologist. He recently had a PET scan showing increased activity in his inguinal nodes with the largest lymph node being 2.1 cm in the left inguinal area. Removal of this lymph node is been requested by the oncologist, Dr. Katheran Awe. He is otherwise doing well and has no complaints.   Medication History (Armen Glo Herring, CMA; 03/07/2020 3:45 PM) Claritin (10MG  Tablet, Oral) Active. LORazepam (0.5MG  Tablet, Oral) Active. Atorvastatin Calcium (80MG  Tablet, Oral) Active. Montelukast Sodium (10MG  Tablet, Oral) Active. Ondansetron HCl (8MG  Tablet, Oral) Active. Prochlorperazine Maleate (10MG  Tablet, Oral) Active. Advil (200MG  Tablet, Oral) Active. Cytoxan (25MG  Tablet, Oral) Active. Dyann Kief (1000MG Gilmer Mor Solution, Intravenous) Active. Multiple Vitamin (Oral) Active. (one a day men's) Excedrin (250-250-65MG  Tablet, Oral) Active. Medications Reconciled  Vitals (Armen Ferguson CMA; 03/07/2020 3:41 PM) 03/07/2020 3:41 PM Weight: 285.5 lb Height: 71in Body Surface Area: 2.45 m Body Mass Index: 39.82 kg/m  Temp.: 58F  Pulse: 136 (Regular)  P.OX: 95% (Room air)       Physical Exam (Demond Shallenberger [name] MD; 03/07/2020 4:02 PM) The physical exam findings are as follows: Note: He appears well today  On exam, there is a deep, palpable enlarged left inguinal lymph node    Assessment &  Plan (Deisha Stull [name] MD; 03/07/2020 4:02 PM) LYMPHADENOPATHY, INGUINAL (R59.0) Impression: I reviewed the PET scan. I reviewed the notes from his oncologist.  Excisional biopsy of the left inguinal lymph node is recommended for complete histologic evaluation to see whether or not he has recurrent lymphoma. I again discussed with him in detail. I discussed the risk which includes but is not limited to bleeding, infection, injury to surrounding structures, seroma formation, cardiopulmonary issues, etc. He understands and agrees to proceed. Surgery will be scheduled urgently. Current Plans

## 2020-03-20 ENCOUNTER — Encounter (HOSPITAL_COMMUNITY)
Admission: RE | Disposition: A | Discharge status: Home / Self Care | Payer: Self-pay | Source: Home / Self Care | Attending: Surgery

## 2020-03-20 ENCOUNTER — Ambulatory Visit (HOSPITAL_COMMUNITY): Payer: BC Managed Care – PPO | Admitting: Certified Registered Nurse Anesthetist

## 2020-03-20 ENCOUNTER — Ambulatory Visit (HOSPITAL_COMMUNITY)
Admission: RE | Admit: 2020-03-20 | Discharge: 2020-03-20 | Disposition: A | Discharge status: Home / Self Care | Payer: BC Managed Care – PPO | Attending: Surgery | Admitting: Surgery

## 2020-03-20 ENCOUNTER — Encounter (HOSPITAL_COMMUNITY): Payer: Self-pay | Admitting: Surgery

## 2020-03-20 DIAGNOSIS — C8295 Follicular lymphoma, unspecified, lymph nodes of inguinal region and lower limb: Secondary | ICD-10-CM | POA: Insufficient documentation

## 2020-03-20 DIAGNOSIS — F419 Anxiety disorder, unspecified: Secondary | ICD-10-CM | POA: Diagnosis not present

## 2020-03-20 DIAGNOSIS — R7303 Prediabetes: Secondary | ICD-10-CM | POA: Diagnosis not present

## 2020-03-20 DIAGNOSIS — R590 Localized enlarged lymph nodes: Secondary | ICD-10-CM | POA: Diagnosis not present

## 2020-03-20 DIAGNOSIS — C8595 Non-Hodgkin lymphoma, unspecified, lymph nodes of inguinal region and lower limb: Secondary | ICD-10-CM | POA: Diagnosis not present

## 2020-03-20 DIAGNOSIS — R591 Generalized enlarged lymph nodes: Secondary | ICD-10-CM | POA: Diagnosis not present

## 2020-03-20 DIAGNOSIS — E7800 Pure hypercholesterolemia, unspecified: Secondary | ICD-10-CM | POA: Diagnosis not present

## 2020-03-20 HISTORY — PX: LYMPH NODE BIOPSY: SHX201

## 2020-03-20 HISTORY — DX: Anxiety disorder, unspecified: F41.9

## 2020-03-20 HISTORY — DX: Prediabetes: R73.03

## 2020-03-20 LAB — GLUCOSE, CAPILLARY
Glucose-Capillary: 180 mg/dL — ABNORMAL HIGH (ref 70–99)
Glucose-Capillary: 150 mg/dL — ABNORMAL HIGH (ref 70–99)

## 2020-03-20 SURGERY — LYMPH NODE BIOPSY
Anesthesia: General | Site: Groin | Laterality: Left

## 2020-03-20 MED ORDER — DEXAMETHASONE SODIUM PHOSPHATE 10 MG/ML IJ SOLN
INTRAMUSCULAR | Status: DC | PRN
  Administered 2020-03-20: 4 mg via INTRAVENOUS

## 2020-03-20 MED ORDER — FENTANYL CITRATE (PF) 100 MCG/2ML IJ SOLN
INTRAMUSCULAR | Status: DC | PRN
  Administered 2020-03-20: 50 ug via INTRAVENOUS

## 2020-03-20 MED ORDER — CHLORHEXIDINE GLUCONATE CLOTH 2 % EX PADS: 6.0000 | MEDICATED_PAD | Freq: Once | CUTANEOUS | Status: DC

## 2020-03-20 MED ORDER — ACETAMINOPHEN 500 MG PO TABS
1000.0000 mg | ORAL_TABLET | ORAL | Status: AC
  Administered 2020-03-20: 1000 mg via ORAL
  Filled 2020-03-20: qty 2

## 2020-03-20 MED ORDER — ONDANSETRON HCL 4 MG/2ML IJ SOLN
INTRAMUSCULAR | Status: DC | PRN
  Administered 2020-03-20: 4 mg via INTRAVENOUS

## 2020-03-20 MED ORDER — OXYCODONE HCL 5 MG/5ML PO SOLN
5.0000 mg | Freq: Once | ORAL | Status: AC | PRN
Start: 2020-03-20 — End: 2020-03-20

## 2020-03-20 MED ORDER — LIDOCAINE HCL (CARDIAC) PF 100 MG/5ML IV SOSY: PREFILLED_SYRINGE | INTRAVENOUS | Status: DC | PRN

## 2020-03-20 MED ORDER — CELECOXIB 200 MG PO CAPS
400.0000 mg | ORAL_CAPSULE | ORAL | Status: AC
  Administered 2020-03-20: 400 mg via ORAL
  Filled 2020-03-20: qty 2

## 2020-03-20 MED ORDER — FENTANYL CITRATE (PF) 100 MCG/2ML IJ SOLN
INTRAMUSCULAR | Status: AC
  Filled 2020-03-20: qty 2

## 2020-03-20 MED ORDER — TRAMADOL HCL 50 MG PO TABS: 50.0000 mg | ORAL_TABLET | Freq: Four times a day (QID) | ORAL | 0 refills | Status: DC | PRN

## 2020-03-20 MED ORDER — LACTATED RINGERS IV SOLN: INTRAVENOUS | Status: DC

## 2020-03-20 MED ORDER — FENTANYL CITRATE (PF) 250 MCG/5ML IJ SOLN
INTRAMUSCULAR | Status: AC
  Filled 2020-03-20: qty 5

## 2020-03-20 MED ORDER — FENTANYL CITRATE (PF) 100 MCG/2ML IJ SOLN
25.0000 ug | INTRAMUSCULAR | Status: DC | PRN
  Administered 2020-03-20: 25 ug via INTRAVENOUS

## 2020-03-20 MED ORDER — MIDAZOLAM HCL 5 MG/5ML IJ SOLN
INTRAMUSCULAR | Status: DC | PRN
  Administered 2020-03-20: 2 mg via INTRAVENOUS

## 2020-03-20 MED ORDER — PROMETHAZINE HCL 25 MG/ML IJ SOLN: 6.2500 mg | INTRAMUSCULAR | Status: DC | PRN

## 2020-03-20 MED ORDER — LIDOCAINE 2% (20 MG/ML) 5 ML SYRINGE
INTRAMUSCULAR | Status: DC | PRN
  Administered 2020-03-20: 100 mg via INTRAVENOUS

## 2020-03-20 MED ORDER — OXYCODONE HCL 5 MG PO TABS
5.0000 mg | ORAL_TABLET | Freq: Once | ORAL | Status: AC | PRN
  Administered 2020-03-20: 5 mg via ORAL

## 2020-03-20 MED ORDER — LIDOCAINE 2% (20 MG/ML) 5 ML SYRINGE
INTRAMUSCULAR | Status: AC
  Filled 2020-03-20: qty 5

## 2020-03-20 MED ORDER — PROPOFOL 10 MG/ML IV BOLUS
INTRAVENOUS | Status: AC
  Filled 2020-03-20: qty 20

## 2020-03-20 MED ORDER — PROPOFOL 1000 MG/100ML IV EMUL
INTRAVENOUS | Status: AC
  Filled 2020-03-20: qty 100

## 2020-03-20 MED ORDER — MIDAZOLAM HCL 2 MG/2ML IJ SOLN
INTRAMUSCULAR | Status: AC
  Filled 2020-03-20: qty 2

## 2020-03-20 MED ORDER — OXYCODONE HCL 5 MG PO TABS
ORAL_TABLET | ORAL | Status: AC
  Filled 2020-03-20: qty 1

## 2020-03-20 MED ORDER — PROPOFOL 10 MG/ML IV BOLUS
INTRAVENOUS | Status: DC | PRN
  Administered 2020-03-20: 200 mg via INTRAVENOUS

## 2020-03-20 MED ORDER — GABAPENTIN 300 MG PO CAPS
300.0000 mg | ORAL_CAPSULE | ORAL | Status: AC
  Administered 2020-03-20: 300 mg via ORAL
  Filled 2020-03-20: qty 1

## 2020-03-20 MED ORDER — ENSURE PRE-SURGERY PO LIQD: 296.0000 mL | Freq: Once | ORAL | Status: DC

## 2020-03-20 MED ORDER — 0.9 % SODIUM CHLORIDE (POUR BTL) OPTIME
TOPICAL | Status: DC | PRN
  Administered 2020-03-20: 1000 mL

## 2020-03-20 MED ORDER — BUPIVACAINE-EPINEPHRINE 0.25% -1:200000 IJ SOLN
INTRAMUSCULAR | Status: DC | PRN
  Administered 2020-03-20: 15 mL

## 2020-03-20 MED ORDER — CHLORHEXIDINE GLUCONATE 0.12 % MT SOLN
15.0000 mL | Freq: Once | OROMUCOSAL | Status: AC
  Administered 2020-03-20: 15 mL via OROMUCOSAL
  Filled 2020-03-20: qty 15

## 2020-03-20 SURGICAL SUPPLY — 32 items
CHLORAPREP W/TINT 10.5 ML (MISCELLANEOUS) ×2 IMPLANT
CNTNR URN SCR LID CUP LEK RST (MISCELLANEOUS) ×1 IMPLANT
CONT SPEC 4OZ STRL OR WHT (MISCELLANEOUS) ×1
COVER SURGICAL LIGHT HANDLE (MISCELLANEOUS) ×2 IMPLANT
COVER WAND RF STERILE (DRAPES) ×2 IMPLANT
DECANTER SPIKE VIAL GLASS SM (MISCELLANEOUS) IMPLANT
DERMABOND ADVANCED (GAUZE/BANDAGES/DRESSINGS) ×1
DERMABOND ADVANCED .7 DNX12 (GAUZE/BANDAGES/DRESSINGS) ×1 IMPLANT
DRAPE LAPAROTOMY 100X72 PEDS (DRAPES) ×2 IMPLANT
ELECT REM PT RETURN 9FT ADLT (ELECTROSURGICAL) ×2
ELECTRODE REM PT RTRN 9FT ADLT (ELECTROSURGICAL) ×1 IMPLANT
GAUZE 4X4 16PLY RFD (DISPOSABLE) ×2 IMPLANT
GLOVE SURG SIGNA 7.5 PF LTX (GLOVE) ×2 IMPLANT
GOWN STRL REUS W/ TWL LRG LVL3 (GOWN DISPOSABLE) ×1 IMPLANT
GOWN STRL REUS W/ TWL XL LVL3 (GOWN DISPOSABLE) ×1 IMPLANT
GOWN STRL REUS W/TWL LRG LVL3 (GOWN DISPOSABLE) ×1
GOWN STRL REUS W/TWL XL LVL3 (GOWN DISPOSABLE) ×1
KIT BASIN OR (CUSTOM PROCEDURE TRAY) ×2 IMPLANT
KIT TURNOVER KIT B (KITS) ×2 IMPLANT
NEEDLE HYPO 25GX1X1/2 BEV (NEEDLE) ×2 IMPLANT
NS IRRIG 1000ML POUR BTL (IV SOLUTION) ×2 IMPLANT
PACK GENERAL/GYN (CUSTOM PROCEDURE TRAY) ×2 IMPLANT
PAD ARMBOARD 7.5X6 YLW CONV (MISCELLANEOUS) ×2 IMPLANT
PENCIL SMOKE EVACUATOR (MISCELLANEOUS) ×2 IMPLANT
SUT MNCRL AB 4-0 PS2 18 (SUTURE) ×2 IMPLANT
SUT VIC AB 2-0 SH 27 (SUTURE) ×1
SUT VIC AB 2-0 SH 27X BRD (SUTURE) ×1 IMPLANT
SUT VIC AB 3-0 SH 27 (SUTURE) ×1
SUT VIC AB 3-0 SH 27XBRD (SUTURE) ×1 IMPLANT
SYR CONTROL 10ML LL (SYRINGE) ×2 IMPLANT
TOWEL GREEN STERILE (TOWEL DISPOSABLE) ×2 IMPLANT
TOWEL GREEN STERILE FF (TOWEL DISPOSABLE) ×2 IMPLANT

## 2020-03-20 NOTE — Anesthesia Preprocedure Evaluation (Addendum)
Anesthesia Evaluation  Patient identified by MRN, date of birth, ID band Patient awake    Reviewed: Allergy & Precautions, NPO status , Patient's Chart, lab work & pertinent test results  History of Anesthesia Complications (+) PONV and history of anesthetic complications  Airway Mallampati: II  TM Distance: >3 FB Neck ROM: Full    Dental no notable dental hx.    Pulmonary sleep apnea , former smoker,    Pulmonary exam normal        Cardiovascular negative cardio ROS Normal cardiovascular exam     Neuro/Psych negative neurological ROS     GI/Hepatic negative GI ROS, Neg liver ROS,   Endo/Other  diabetes, Type 2, Oral Hypoglycemic Agents  Renal/GU negative Renal ROS  negative genitourinary   Musculoskeletal negative musculoskeletal ROS (+)   Abdominal   Peds  Hematology negative hematology ROS (+)   Anesthesia Other Findings Day of surgery medications reviewed with patient.  Reproductive/Obstetrics negative OB ROS                            Anesthesia Physical Anesthesia Plan  ASA: II  Anesthesia Plan: General   Post-op Pain Management:    Induction: Intravenous  PONV Risk Score and Plan: 3 and Treatment may vary due to age or medical condition, Ondansetron, Dexamethasone and Midazolam  Airway Management Planned: LMA  Additional Equipment: None  Intra-op Plan:   Post-operative Plan: Extubation in OR  Informed Consent: I have reviewed the patients History and Physical, chart, labs and discussed the procedure including the risks, benefits and alternatives for the proposed anesthesia with the patient or authorized representative who has indicated his/her understanding and acceptance.     Dental advisory given  Plan Discussed with: CRNA  Anesthesia Plan Comments:        Anesthesia Quick Evaluation

## 2020-03-20 NOTE — Op Note (Signed)
EXCISIONAL BIOPSY LEFT INGUINAL LYMPH NODE  Procedure Note  James Jennings 03/20/2020   Pre-op Diagnosis: lymphadenopathy with a  history of lymphoma     Post-op Diagnosis: same  Procedure(s): EXCISIONAL BIOPSY LEFT INGUINAL LYMPH NODE  Surgeon(s): Coralie Keens, MD  Anesthesia: General  Staff:  Circulator: Rometta Emery, RN; Riojas, Marita Kansas, RN Scrub Person: Lovett Sox, CST  Estimated Blood Loss: Minimal               Specimens: sent to path  Indications: This is a 50 year old gentleman with a prior history of lymphoma who now suspected to have potential recurrence based on recent PET scan.  Oncology requested removal of a large left inguinal lymph node for histologic evaluation.    Findings: There is a very large lymph node below the inguinal ligament over the femoral vessels measuring over 3 cm which was excised and sent to pathology for evaluation  Procedure: The patient was brought to the operating room and identified as the correct patient.  He was placed upon the operating table general anesthesia was induced.  His left groin and leg were prepped and draped in usual sterile fashion.  I anesthetized the skin low the inguinal crease on the very proximal left thigh with Marcaine.  I then made a longitudinal incision with a scalpel.  I then dissected down to the deep subcutaneous tissue.  I encountered a very large lymph node.  It was fairly fixated and was superficial to the femoral vessels.  Slowly dissected the lymph node out circumferentially having to clip several bridging vessels as well as the blood supply to the large lymph node with surgical clips as well as several 2-0 silk sutures.  I was then able to completely remove the large lymph node in its entirety.  A lymph node was then sent to pathology for evaluation. I then irrigated the incision with saline.  Hemostasis appeared to be achieved.  I could feel a strong pulse in the left femoral artery which was deep to the  lymph node.  I then closed the deep subcutaneous tissue with interrupted 2-0 Vicryl sutures.  I then closed the more superficial tissue with interrupted 3-0 Vicryl sutures and closed skin with running 4-0 Monocryl.  Dermabond was then applied.  The patient tolerated the procedure well.  All the counts were correct at the end of the procedure.  The patient was then extubated in the operating room and taken in stable addition to the recovery room.          Coralie Keens   Date: 03/20/2020  Time: 12:02 PM

## 2020-03-20 NOTE — Anesthesia Postprocedure Evaluation (Addendum)
Anesthesia Post Note  Patient: James Jennings  Procedure(s) Performed: EXCISIONAL BIOPSY LEFT INGUINAL LYMPH NODE (Left Groin)     Patient location during evaluation: PACU Anesthesia Type: General Level of consciousness: awake and alert and oriented Pain management: pain level controlled Vital Signs Assessment: post-procedure vital signs reviewed and stable Respiratory status: spontaneous breathing, nonlabored ventilation and respiratory function stable Cardiovascular status: blood pressure returned to baseline Postop Assessment: no apparent nausea or vomiting Anesthetic complications: no   No complications documented.  Last Vitals:  Vitals:   03/20/20 1225 03/20/20 1240  BP: 135/80 125/76  Pulse: 82 76  Resp: 13 10  Temp:  36.5 C  SpO2: 97% 98%    Last Pain:  Vitals:   03/20/20 1225  TempSrc:   PainSc: [name]

## 2020-03-20 NOTE — Anesthesia Procedure Notes (Addendum)
Procedure Name: LMA Insertion Date/Time: 03/20/2020 11:17 AM Performed by: Georgia Duff, CRNA Pre-anesthesia Checklist: Patient identified, Emergency Drugs available, Suction available and Patient being monitored Patient Re-evaluated:Patient Re-evaluated prior to induction Oxygen Delivery Method: Circle System Utilized and Circle system utilized Preoxygenation: Pre-oxygenation with 100% oxygen Induction Type: IV induction Ventilation: Mask ventilation with difficulty LMA: LMA inserted LMA Size: 5.0 Number of attempts: 1 Airway Equipment and Method: Bite block Placement Confirmation: positive ETCO2 Tube secured with: Tape Dental Injury: Teeth and Oropharynx as per pre-operative assessment

## 2020-03-20 NOTE — Transfer of Care (Signed)
Immediate Anesthesia Transfer of Care Note  Patient: James Jennings  Procedure(s) Performed: EXCISIONAL BIOPSY LEFT INGUINAL LYMPH NODE (Left Groin)  Patient Location: PACU  Anesthesia Type:General  Level of Consciousness: drowsy and patient cooperative  Airway & Oxygen Therapy: Patient Spontanous Breathing and Patient connected to nasal cannula oxygen  Post-op Assessment: Report given to RN and Post -op Vital signs reviewed and stable  Post vital signs: Reviewed  Last Vitals:  Vitals Value Taken Time  BP    Temp    Pulse    Resp    SpO2      Last Pain:  Vitals:   03/20/20 0958  TempSrc:   PainSc: 0-No pain         Complications: No complications documented.

## 2020-03-20 NOTE — Interval H&P Note (Signed)
History and Physical Interval Note:no change in H and P  03/20/2020 9:42 AM  James Jennings  has presented today for surgery, with the diagnosis of lymphadenopathy history of lymphoma.  The various methods of treatment have been discussed with the patient and family. After consideration of risks, benefits and other options for treatment, the patient has consented to  Procedure(s) with comments: EXCISIONAL BIOPSY LEFT INGUINAL LYMPH NODE (Left) - LMA as a surgical intervention.  The patient's history has been reviewed, patient examined, no change in status, stable for surgery.  I have reviewed the patient's chart and labs.  Questions were answered to the patient's satisfaction.     Coralie Keens

## 2020-03-20 NOTE — Discharge Instructions (Signed)
Ok to shower starting tomorrow  Ice pack, tylenol, and ibuprofen also for pain  No vigorous activity for one week

## 2020-03-21 ENCOUNTER — Encounter (HOSPITAL_COMMUNITY): Payer: Self-pay | Admitting: Surgery

## 2020-03-25 ENCOUNTER — Telehealth: Payer: Self-pay

## 2020-03-25 LAB — SURGICAL PATHOLOGY

## 2020-03-25 NOTE — Telephone Encounter (Signed)
Patient called to see if his pathology report was back on his biopsy and wanted to know if he was get his infusion tomorrow 2/22. Called patient back and informed him Dr.Ennever was working on getting his pathology report back so he can review it with him tomorrow to still come in for appt and infusion will be dependant on the pathology. patient verbalized understanding and denies any other questions or concerns at this time.

## 2020-03-26 ENCOUNTER — Inpatient Hospital Stay: Payer: BC Managed Care – PPO | Attending: Hematology & Oncology

## 2020-03-26 ENCOUNTER — Other Ambulatory Visit: Payer: Self-pay

## 2020-03-26 ENCOUNTER — Encounter: Payer: Self-pay | Admitting: Hematology & Oncology

## 2020-03-26 ENCOUNTER — Inpatient Hospital Stay: Payer: BC Managed Care – PPO

## 2020-03-26 ENCOUNTER — Inpatient Hospital Stay (HOSPITAL_BASED_OUTPATIENT_CLINIC_OR_DEPARTMENT_OTHER): Payer: BC Managed Care – PPO | Admitting: Hematology & Oncology

## 2020-03-26 VITALS — BP 135/86 | HR 99 | Temp 98.4°F | Resp 18 | Wt 281.2 lb

## 2020-03-26 DIAGNOSIS — C8223 Follicular lymphoma grade III, unspecified, intra-abdominal lymph nodes: Secondary | ICD-10-CM

## 2020-03-26 DIAGNOSIS — R509 Fever, unspecified: Secondary | ICD-10-CM | POA: Insufficient documentation

## 2020-03-26 LAB — CBC WITH DIFFERENTIAL (CANCER CENTER ONLY)
Abs Immature Granulocytes: 0.03 10*3/uL (ref 0.00–0.07)
Basophils Absolute: 0 10*3/uL (ref 0.0–0.1)
Basophils Relative: 1 %
Eosinophils Absolute: 0.1 10*3/uL (ref 0.0–0.5)
Eosinophils Relative: 2 %
HCT: 36.3 % — ABNORMAL LOW (ref 39.0–52.0)
Hemoglobin: 12.5 g/dL — ABNORMAL LOW (ref 13.0–17.0)
Immature Granulocytes: 1 %
Lymphocytes Relative: 10 %
Lymphs Abs: 0.6 10*3/uL — ABNORMAL LOW (ref 0.7–4.0)
MCH: 30.9 pg (ref 26.0–34.0)
MCHC: 34.4 g/dL (ref 30.0–36.0)
MCV: 89.9 fL (ref 80.0–100.0)
Monocytes Absolute: 0.6 10*3/uL (ref 0.1–1.0)
Monocytes Relative: 10 %
Neutro Abs: 4.6 10*3/uL (ref 1.7–7.7)
Neutrophils Relative %: 76 %
Platelet Count: 180 10*3/uL (ref 150–400)
RBC: 4.04 MIL/uL — ABNORMAL LOW (ref 4.22–5.81)
RDW: 12.7 % (ref 11.5–15.5)
WBC Count: 6 10*3/uL (ref 4.0–10.5)
nRBC: 0 % (ref 0.0–0.2)

## 2020-03-26 LAB — CMP (CANCER CENTER ONLY)
ALT: 19 U/L (ref 0–44)
AST: 16 U/L (ref 15–41)
Albumin: 4.3 g/dL (ref 3.5–5.0)
Alkaline Phosphatase: 119 U/L (ref 38–126)
Anion gap: 8 (ref 5–15)
BUN: 20 mg/dL (ref 6–20)
CO2: 29 mmol/L (ref 22–32)
Calcium: 9.7 mg/dL (ref 8.9–10.3)
Chloride: 101 mmol/L (ref 98–111)
Creatinine: 1.02 mg/dL (ref 0.61–1.24)
GFR, Estimated: >60 mL/min (ref >60)
Glucose, Bld: 226 mg/dL — ABNORMAL HIGH (ref 70–99)
Potassium: 4.2 mmol/L (ref 3.5–5.1)
Sodium: 138 mmol/L (ref 135–145)
Total Bilirubin: 0.6 mg/dL (ref 0.3–1.2)
Total Protein: 6.8 g/dL (ref 6.5–8.1)

## 2020-03-26 LAB — LACTATE DEHYDROGENASE: LDH: 160 U/L (ref 98–192)

## 2020-03-26 MED ORDER — DEXTROSE 5 % IV SOLN
2.0000 g | Freq: Once | INTRAVENOUS | Status: AC
  Administered 2020-03-26: 2 g via INTRAVENOUS
  Filled 2020-03-26: qty 20

## 2020-03-26 MED ORDER — HEPARIN SOD (PORK) LOCK FLUSH 100 UNIT/ML IV SOLN
500.0000 [IU] | Freq: Once | INTRAVENOUS | Status: AC
  Administered 2020-03-26: 500 [IU] via INTRAVENOUS
  Filled 2020-03-26: qty 5

## 2020-03-26 MED ORDER — SODIUM CHLORIDE 0.9 % IV SOLN
INTRAVENOUS | Status: DC
  Filled 2020-03-26: qty 250

## 2020-03-26 MED ORDER — SODIUM CHLORIDE 0.9% FLUSH
10.0000 mL | Freq: Once | INTRAVENOUS | Status: AC
  Administered 2020-03-26: 10 mL via INTRAVENOUS
  Filled 2020-03-26: qty 10

## 2020-03-26 NOTE — Progress Notes (Signed)
Hematology and Oncology Follow Up Visit  James Jennings [id number] 1970-09-09 50 y.o. 03/26/2020   Principle Diagnosis:  Follicular B- cell non-Hodgkin's lymphoma - Relapsed  Past Therapy:             Rituxan/bendamustine-s/p cycle 6 - completed on 07/03/2016  Current Therapy: CHOP-Gazyva -- start cycle #1 on 04/03/2020 Gazyva  - Cytoxan/ Vincristine/Prednisone  - started 11/07/2019, s/p cycle #4 --  D/c on 03/26/2020    Interim History:  James Jennings is here today for follow-up.  Unfortunately, looks like we now have an issue with respect to his lymphoma.  We did his last PET scan, we found that he had a active lymph node down in the left inguinal region.  We went ahead and got this resected.  This was done on 03/20/2020.  The pathology report ([id number]) showed a follicular lymphoma that was trying to transformed.  There was a high-grade component to this.  There is a diffuse component to this.  Think this is not a true Richter's syndrome but it does seem to be consistent with the lymphoma trying to become more of a aggressive lymphoma.  This is the only lymph node that was positive for this change.  Otherwise, he is done quite nicely.  I think that we have to make a change in chemotherapy as that we feel a bit more aggressive.  I think using CHOP chemotherapy would be very reasonable.  I would use this with Gazyva.  We cannot use Rituxan because he is allergic to Rituxan.  I believe this will be helpful.  We will have to get an echocardiogram on him.  He is working.  He is quite busy at work.  He has had no issues with nausea or vomiting.  He has had no diarrhea.  There has been no mouth sores.  He has had no rashes.  There has been no bleeding.  Overall, his performance status is ECOG 1.     Medications:  Allergies as of 03/26/2020      Reactions   Mango Flavor Swelling, Other (See Comments)   LIPS SWELL   Shellfish Allergy Anaphylaxis   Lactose Intolerance (gi) Diarrhea    Rituximab Other (See Comments)      Medication List       Accurate as of March 26, 2020  9:40 AM. If you have any questions, ask your nurse or doctor.        acetaminophen 325 MG tablet Commonly known as: TYLENOL Take 650 mg by mouth every 6 (six) hours as needed for moderate pain or headache.   atorvastatin 80 MG tablet Commonly known as: LIPITOR Take 80 mg by mouth daily.   cetirizine 10 MG tablet Commonly known as: ZYRTEC Take 10 mg by mouth daily as needed for allergies.   ibuprofen 200 MG tablet Commonly known as: ADVIL Take 600 mg by mouth every 8 (eight) hours as needed for mild pain or moderate pain.   lidocaine-prilocaine cream Commonly known as: EMLA Apply 1 application topically as needed. Apply to [facility] 1 hour prior to procedure.   LORazepam 0.5 MG tablet Commonly known as: ATIVAN Take 1 tablet (0.5 mg total) by mouth every 6 (six) hours as needed for anxiety.   metFORMIN 500 MG tablet Commonly known as: GLUCOPHAGE Take 500 mg by mouth daily.   montelukast 10 MG tablet Commonly known as: SINGULAIR TAKE 1 TABLET BY MOUTH AT BEDTIME. START 3 DAYS BEFORE CHEMO AND TAKE FOR 30 DAYS What changed: See  the new instructions.   multivitamin Tabs tablet Take 1 tablet by mouth daily.   ondansetron 8 MG tablet Commonly known as: ZOFRAN Take 1 tablet (8 mg total) by mouth every 8 (eight) hours as needed for nausea or vomiting.   prochlorperazine 10 MG tablet Commonly known as: COMPAZINE Take 1 tablet (10 mg total) by mouth every 6 (six) hours as needed for nausea or vomiting.   traMADol 50 MG tablet Commonly known as: ULTRAM Take 1 tablet (50 mg total) by mouth every 6 (six) hours as needed for moderate pain.       Allergies:  Allergies  Allergen Reactions  . Mango Flavor Swelling and Other (See Comments)    LIPS SWELL  . Shellfish Allergy Anaphylaxis  . Lactose Intolerance (Gi) Diarrhea  . Rituximab Other (See Comments)    Past Medical History,  Surgical history, Social history, and Family History were reviewed and updated.  Review of Systems: Review of Systems  Constitutional: Negative.   HENT: Negative.   Eyes: Negative.   Respiratory: Negative.   Cardiovascular: Negative.   Gastrointestinal: Negative.   Genitourinary: Negative.   Musculoskeletal: Negative.   Skin: Negative.   Neurological: Negative.   Endo/Heme/Allergies: Negative.   Psychiatric/Behavioral: Negative.      Physical Exam:  weight is 281 lb 4 oz (127.6 kg). His oral temperature is 98.4 F (36.9 C). His blood pressure is 135/86 and his pulse is 99. His respiration is 18 and oxygen saturation is 96%.   Wt Readings from Last 3 Encounters:  03/26/20 281 lb 4 oz (127.6 kg)  03/20/20 277 lb (125.6 kg)  03/14/20 277 lb (125.6 kg)    Physical Exam Vitals reviewed.  HENT:     Head: Normocephalic and atraumatic.  Eyes:     Pupils: Pupils are equal, round, and reactive to light.  Cardiovascular:     Rate and Rhythm: Normal rate and regular rhythm.     Heart sounds: Normal heart sounds.  Pulmonary:     Effort: Pulmonary effort is normal.     Breath sounds: Normal breath sounds.  Abdominal:     General: Bowel sounds are normal.     Palpations: Abdomen is soft.  Musculoskeletal:        General: No tenderness or deformity. Normal range of motion.     Cervical back: Normal range of motion.  Lymphadenopathy:     Cervical: No cervical adenopathy.  Skin:    General: Skin is warm and dry.     Findings: No erythema or rash.  Neurological:     Mental Status: He is alert and oriented to person, place, and time.  Psychiatric:        Behavior: Behavior normal.        Thought Content: Thought content normal.        Judgment: Judgment normal.      Lab Results  Component Value Date   WBC 6.0 03/26/2020   HGB 12.5 (L) 03/26/2020   HCT 36.3 (L) 03/26/2020   MCV 89.9 03/26/2020   PLT 180 03/26/2020   No results found for: FERRITIN, IRON, TIBC, UIBC,  IRONPCTSAT Lab Results  Component Value Date   RETICCTPCT 1.07 12/06/2015   RBC 4.04 (L) 03/26/2020   RETICCTABS 48.79 12/06/2015   No results found for: [facility] Lab Results  Component Value Date   IGGSERUM 785 06/04/2016   IGMSERUM 52 06/04/2016   Lab Results  Component Value Date   TOTALPROTELP 6.5 10/31/2019   ALBUMINELP  3.7 10/31/2019   A1GS 0.2 10/31/2019   A2GS 0.7 10/31/2019   BETS 1.1 10/31/2019   GAMS 0.8 10/31/2019   MSPIKE Not Observed 10/31/2019     Chemistry      Component Value Date/Time   NA 138 03/26/2020 0906   NA 138 03/14/2020 0839   NA 141 12/03/2016 1155   NA 141 02/07/2016 1149   K 4.2 03/26/2020 0906   K 3.8 12/03/2016 1155   K 4.3 02/07/2016 1149   CL 101 03/26/2020 0906   CL 100 12/03/2016 1155   CO2 29 03/26/2020 0906   CO2 27 12/03/2016 1155   CO2 27 02/07/2016 1149   BUN 20 03/26/2020 0906   BUN 15 03/14/2020 0839   BUN 10 12/03/2016 1155   BUN 10.9 02/07/2016 1149   CREATININE 1.02 03/26/2020 0906   CREATININE 1.0 12/03/2016 1155   CREATININE 0.9 02/07/2016 1149      Component Value Date/Time   CALCIUM 9.7 03/26/2020 0906   CALCIUM 9.1 12/03/2016 1155   CALCIUM 9.6 02/07/2016 1149   ALKPHOS 119 03/26/2020 0906   ALKPHOS 99 (H) 12/03/2016 1155   ALKPHOS 127 02/07/2016 1149   AST 16 03/26/2020 0906   AST 17 02/07/2016 1149   ALT 19 03/26/2020 0906   ALT 24 12/03/2016 1155   ALT 16 02/07/2016 1149   BILITOT 0.6 03/26/2020 0906   BILITOT 0.42 02/07/2016 1149       Impression and Plan: [name] a pleasant 50 yo caucasian gentleman with history of follicular large cell non-Hodgkin's lymphoma. He completed 6 cycles of chemotherapy with bendamustine on 07/03/2016 but was not tolerant of Rituxan (anaphylaxis). His lymphoma has now recurred.   Again, we will make another change in his protocol.  We will put him on  CHOP chemotherapy.  I think this is appropriate.  I think with the Adriamycin added,  this will certainly be effective.  One issue might be whether or not he needs any radiation therapy to the left inguinal area.  This might be a reasonable approach.  I think we have 4 cycles of treatment would be a good way to go.  I would repeat the PET scan after the second cycle.  I went over the chemotherapy side effects.  Explained him about the hair loss.  He understands this.  We certainly might have more in the way of nausea.  We will try to be aggressive with this.  We will start treatment next week.  He has to get his echocardiogram initially.  I will see him back when he starts his second cycle of treatment in mid March.   [name], MD 2/22/20229:40 AM

## 2020-03-26 NOTE — Patient Instructions (Signed)
Implanted Port Insertion, Care After This sheet gives you information about how to care for yourself after your procedure. Your health care provider may also give you more specific instructions. If you have problems or questions, contact your health care provider. What can I expect after the procedure? After the procedure, it is common to have:  Discomfort at the port insertion site.  Bruising on the skin over the port. This should improve over 3-4 days. Follow these instructions at home: Port care  After your port is placed, you will get a manufacturer's information card. The card has information about your port. Keep this card with you at all times.  Take care of the port as told by your health care provider. Ask your health care provider if you or a family member can get training for taking care of the port at home. A home health care nurse may also take care of the port.  Make sure to remember what type of port you have. Incision care  Follow instructions from your health care provider about how to take care of your port insertion site. Make sure you: ? Wash your hands with soap and water before and after you change your bandage (dressing). If soap and water are not available, use hand sanitizer. ? Change your dressing as told by your health care provider. ? Leave stitches (sutures), skin glue, or adhesive strips in place. These skin closures may need to stay in place for 2 weeks or longer. If adhesive strip edges start to loosen and curl up, you may trim the loose edges. Do not remove adhesive strips completely unless your health care provider tells you to do that.  Check your port insertion site every day for signs of infection. Check for: ? Redness, swelling, or pain. ? Fluid or blood. ? Warmth. ? Pus or a bad smell.      Activity  Return to your normal activities as told by your health care provider. Ask your health care provider what activities are safe for you.  Do not  lift anything that is heavier than 10 lb (4.5 kg), or the limit that you are told, until your health care provider says that it is safe. General instructions  Take over-the-counter and prescription medicines only as told by your health care provider.  Do not take baths, swim, or use a hot tub until your health care provider approves. Ask your health care provider if you may take showers. You may only be allowed to take sponge baths.  Do not drive for 24 hours if you were given a sedative during your procedure.  Wear a medical alert bracelet in case of an emergency. This will tell any health care providers that you have a port.  Keep all follow-up visits as told by your health care provider. This is important. Contact a health care provider if:  You cannot flush your port with saline as directed, or you cannot draw blood from the port.  You have a fever or chills.  You have redness, swelling, or pain around your port insertion site.  You have fluid or blood coming from your port insertion site.  Your port insertion site feels warm to the touch.  You have pus or a bad smell coming from the port insertion site. Get help right away if:  You have chest pain or shortness of breath.  You have bleeding from your port that you cannot control. Summary  Take care of the port as told by your   health care provider. Keep the manufacturer's information card with you at all times.  Change your dressing as told by your health care provider.  Contact a health care provider if you have a fever or chills or if you have redness, swelling, or pain around your port insertion site.  Keep all follow-up visits as told by your health care provider. This information is not intended to replace advice given to you by your health care provider. Make sure you discuss any questions you have with your health care provider. Document Revised: 08/17/2017 Document Reviewed: 08/17/2017 Elsevier Patient Education   2021 [employer]  

## 2020-03-26 NOTE — Progress Notes (Signed)
DISCONTINUE OFF PATHWAY REGIMEN - Lymphoma and CLL   [id number]:R-CVP q21 days:   A cycle is every 21 days:     Rituximab-xxxx      Cyclophosphamide      Vincristine      Prednisone   **Always confirm dose/schedule in your pharmacy ordering system**  REASON: Disease Progression PRIOR TREATMENT: Off Pathway: R-CVP q21 days TREATMENT RESPONSE: Partial Response (PR)  START OFF PATHWAY REGIMEN - Lymphoma and CLL   [id number]:CHOP q21 days:   A cycle is every 21 days:     Cyclophosphamide      Doxorubicin      Vincristine      Prednisone   **Always confirm dose/schedule in your pharmacy ordering system**  Patient Characteristics: Diffuse Large B-Cell Lymphoma or Follicular Lymphoma, Grade 3B, Relapsed / Refractory, All Stages,  Second Line, Transplant Candidate Disease Type: Follicular Lymphoma, Grade 3B Disease Type: Not Applicable Disease Type: Not Applicable Line of therapy: Relapsed / Refractory - Second Line Patient Characteristics: Transplant Candidate Intent of Therapy: Curative Intent, Discussed with Patient

## 2020-03-26 NOTE — Patient Instructions (Signed)
Ceftriaxone Injection What is this medicine? CEFTRIAXONE (sef try AX one) is a cephalosporin antibiotic. It treats some infections caused by bacteria. It will not work for colds, the flu, or other viruses. This medicine may be used for other purposes; ask your health care provider or pharmacist if you have questions. COMMON BRAND NAME(S): Ceftrisol Plus, Rocephin What should I tell my health care provider before I take this medicine? They need to know if you have any of these conditions:  any chronic illness  bowel disease, like colitis  both kidney and liver disease  high bilirubin level in newborn patients  an unusual or allergic reaction to ceftriaxone, other cephalosporin or penicillin antibiotics, foods, dyes, or preservatives  pregnant or trying to get pregnant  breast-feeding How should I use this medicine? This drug is injected into a muscle or a vein. It is usually given by a health care provider in a hospital or clinic setting. If you get this drug at home, you will be taught how to prepare and give it. Use exactly as directed. Take it as directed on the prescription label at the same time every day. Keep taking it unless your health care provider tells you to stop. It is important that you put your used needles and syringes in a special sharps container. Do not put them in a trash can. If you do not have a sharps container, call your pharmacist or health care provider to get one. Talk to your health care provider about the use of this drug in children. While it may be prescribed for children as young as newborns for selected conditions, precautions do apply. Overdosage: If you think you have taken too much of this medicine contact a poison control center or emergency room at once. NOTE: This medicine is only for you. Do not share this medicine with others. What if I miss a dose? It is important not to miss your dose. Call your health care provider if you are unable to keep an  appointment. If you give yourself this drug at home and you miss a dose, take it as soon as you can. If it is almost time for your next dose, take only that dose. Do not take double or extra doses. What may interact with this medicine? Do not take this medicine with any of the following medications:  intravenous calcium This medicine may also interact with the following medications:  birth control pills This list may not describe all possible interactions. Give your health care provider a list of all the medicines, herbs, non-prescription drugs, or dietary supplements you use. Also tell them if you smoke, drink alcohol, or use illegal drugs. Some items may interact with your medicine. What should I watch for while using this medicine? Tell your doctor or health care provider if your symptoms do not improve or if they get worse. This medicine may cause serious skin reactions. They can happen weeks to months after starting the medicine. Contact your health care provider right away if you notice fevers or flu-like symptoms with a rash. The rash may be red or purple and then turn into blisters or peeling of the skin. Or, you might notice a red rash with swelling of the face, lips or lymph nodes in your neck or under your arms. Do not treat diarrhea with over the counter products. Contact your doctor if you have diarrhea that lasts more than 2 days or if it is severe and watery. If you are being treated for   a sexually transmitted disease, avoid sexual contact until you have finished your treatment. Having sex can infect your sexual partner. Calcium may bind to this medicine and cause lung or kidney problems. Avoid calcium products while taking this medicine and for 48 hours after taking the last dose of this medicine. What side effects may I notice from receiving this medicine? Side effects that you should report to your doctor or health care professional as soon as possible:  allergic reactions like  skin rash, itching or hives, swelling of the face, lips, or tongue  breathing problems  fever, chills  irregular heartbeat  pain when passing urine  redness, blistering, peeling, or loosening of the skin, including inside the mouth  seizures  stomach pain, cramps  unusual bleeding, bruising  unusually weak or tired Side effects that usually do not require medical attention (report to your doctor or health care professional if they continue or are bothersome):  diarrhea  dizzy, drowsy  headache  nausea, vomiting  pain, swelling, irritation where injected  stomach upset  sweating This list may not describe all possible side effects. Call your doctor for medical advice about side effects. You may report side effects to FDA at 1-800-FDA-1088. Where should I keep my medicine? Keep out of the reach of children and pets. You will be instructed on how to store this drug. Protect from light. Throw away any unused drug after the expiration date. NOTE: This sheet is a summary. It may not cover all possible information. If you have questions about this medicine, talk to your doctor, pharmacist, or health care provider.  2021 [employer]/Gold Standard (2018-08-25 18:29:21)  

## 2020-03-27 ENCOUNTER — Telehealth: Payer: Self-pay

## 2020-03-27 MED ORDER — CEFDINIR 300 MG PO CAPS: 600.0000 mg | ORAL_CAPSULE | Freq: Every day | ORAL | 0 refills | Status: DC

## 2020-03-27 NOTE — Addendum Note (Signed)
Addended by: Burney Gauze R on: 03/27/2020 10:00 AM   Modules accepted: Orders

## 2020-03-27 NOTE — Telephone Encounter (Signed)
Per inbasket message pt is to have Gazyva as well as cytox so appts will be 8hrs.  Appts have been adjusted and Dr Marin Olp to adjust tx plan  Webb Silversmith

## 2020-03-28 ENCOUNTER — Other Ambulatory Visit: Payer: Self-pay

## 2020-03-28 ENCOUNTER — Ambulatory Visit (INDEPENDENT_AMBULATORY_CARE_PROVIDER_SITE_OTHER): Payer: BC Managed Care – PPO | Admitting: Family Medicine

## 2020-03-28 ENCOUNTER — Encounter (INDEPENDENT_AMBULATORY_CARE_PROVIDER_SITE_OTHER): Payer: Self-pay | Admitting: Family Medicine

## 2020-03-28 VITALS — BP 132/79 | HR 83 | Temp 98.3°F | Ht 71.0 in | Wt 278.0 lb

## 2020-03-28 DIAGNOSIS — E782 Mixed hyperlipidemia: Secondary | ICD-10-CM | POA: Diagnosis not present

## 2020-03-28 DIAGNOSIS — Z6838 Body mass index (BMI) 38.0-38.9, adult: Secondary | ICD-10-CM

## 2020-03-28 DIAGNOSIS — E1169 Type 2 diabetes mellitus with other specified complication: Secondary | ICD-10-CM

## 2020-03-28 DIAGNOSIS — Z9189 Other specified personal risk factors, not elsewhere classified: Secondary | ICD-10-CM | POA: Diagnosis not present

## 2020-03-28 MED ORDER — METFORMIN HCL 500 MG PO TABS: 500.0000 mg | ORAL_TABLET | Freq: Every day | ORAL | 1 refills | Status: DC

## 2020-03-28 MED ORDER — OZEMPIC (0.25 OR 0.5 MG/DOSE) 2 MG/1.5ML ~~LOC~~ SOPN: 0.2500 mg | PEN_INJECTOR | SUBCUTANEOUS | 1 refills | Status: DC

## 2020-03-28 MED ORDER — ATORVASTATIN CALCIUM 80 MG PO TABS
80.0000 mg | ORAL_TABLET | Freq: Every day | ORAL | 0 refills | Status: DC
Start: 2020-03-28 — End: 2020-05-09

## 2020-03-30 ENCOUNTER — Encounter: Payer: Self-pay | Admitting: Hematology & Oncology

## 2020-04-01 ENCOUNTER — Ambulatory Visit (HOSPITAL_COMMUNITY)
Admission: RE | Admit: 2020-04-01 | Discharge: 2020-04-01 | Disposition: A | Discharge status: Home / Self Care | Payer: BC Managed Care – PPO | Source: Ambulatory Visit | Attending: Hematology & Oncology | Admitting: Hematology & Oncology

## 2020-04-01 ENCOUNTER — Other Ambulatory Visit: Payer: Self-pay | Admitting: *Deleted

## 2020-04-01 ENCOUNTER — Other Ambulatory Visit: Payer: Self-pay

## 2020-04-01 ENCOUNTER — Encounter: Payer: Self-pay | Admitting: *Deleted

## 2020-04-01 DIAGNOSIS — C8223 Follicular lymphoma grade III, unspecified, intra-abdominal lymph nodes: Secondary | ICD-10-CM

## 2020-04-01 DIAGNOSIS — Z01818 Encounter for other preprocedural examination: Secondary | ICD-10-CM | POA: Diagnosis not present

## 2020-04-01 LAB — ECHOCARDIOGRAM COMPLETE
Area-P 1/2: 3.99 cm2
S' Lateral: 2.8 cm

## 2020-04-01 NOTE — Progress Notes (Signed)
Chief Complaint:   OBESITY James Jennings is here to discuss his progress with his obesity treatment plan along with follow-up of his obesity related diagnoses. James Jennings is on the Category 4 Plan and states he is following his eating plan approximately 65-70% of the time. James Jennings states he is walking for 35-40 minutes 2 times per week.  Today's visit was #: 2 Starting weight: 277 lbs Starting date: 03/14/2020 Today's weight: 278 lbs Today's date: 03/28/2020 Total lbs lost to date: 0 Total lbs lost since last in-office visit: 0  Interim History: James Jennings has done well with weight loss, but he is retaining some fluid. He struggles somewhat with dinner and eating out. He notes hunger has been an issue.  Subjective:   1. Type 2 diabetes mellitus with other specified complication, without long-term current use of insulin ([place]) James Jennings has a ne diagnosis of diabetes mellitus. His non-fasting glucose is >200 and A1c >7. He is working on diet and exercise, and he is on metformin. He notes significant polyphagia. I discussed labs with the patient today.  2. Mixed hyperlipidemia James Jennings is on Lipitor, and he is working on diet, he has a new diagnosis of diabetes mellitus. I discussed labs with the patient today.  3. At risk for heart disease James Jennings is at a higher than average risk for cardiovascular disease due to obesity.   Assessment/Plan:   1. Type 2 diabetes mellitus with other specified complication, without long-term current use of insulin ([name]) Good blood sugar control is important to decrease the likelihood of diabetic complications such as nephropathy, neuropathy, limb loss, blindness, coronary artery disease, and death. Intensive lifestyle modification including diet, exercise and weight loss are the first line of treatment for diabetes. James Jennings agreed to start Ozempic 0.25 mg q weekly with no refills, and we will refill metformin for 2 months.  - Semaglutide,0.25 or 0.5MG /DOS, (OZEMPIC, 0.25 OR 0.5 MG/DOSE,) 2  MG/1.5ML SOPN; Inject 0.25 mg into the skin once a week.  Dispense: 1.5 mL; Refill: 1 - metFORMIN (GLUCOPHAGE) 500 MG tablet; Take 1 tablet (500 mg total) by mouth daily.  Dispense: 30 tablet; Refill: 1  2. Mixed hyperlipidemia Cardiovascular risk and specific lipid/LDL goals reviewed. We discussed several lifestyle modifications today. James Jennings will continue his Category 4 plan, and will continue to work on exercise and weight loss efforts. We will refill Lipitor for 1 month. Orders and follow up as documented in patient record.   - atorvastatin (LIPITOR) 80 MG tablet; Take 1 tablet (80 mg total) by mouth daily.  Dispense: 30 tablet; Refill: 0  3. At risk for heart disease James Jennings was given approximately 30 minutes of coronary artery disease prevention counseling today. He is 50 y.o. male and has risk factors for heart disease including obesity. We discussed intensive lifestyle modifications today with an emphasis on specific weight loss instructions and strategies.   Repetitive spaced learning was employed today to elicit superior memory formation and behavioral change.  4. Class 2 severe obesity with serious comorbidity and body mass index (BMI) of 38.0 to 38.9 in adult, unspecified obesity type [facility]) James Jennings is currently in the action stage of change. As such, his goal is to continue with weight loss efforts. He has agreed to the Category 4 Plan and keeping a food journal and adhering to recommended goals of 400-650 calories and 40 grams of protein at supper daily.   Exercise goals: As is.  Behavioral modification strategies: increasing lean protein intake, decreasing simple carbohydrates and decreasing eating  out.  James Jennings has agreed to follow-up with our clinic in 2 weeks. He was informed of the importance of frequent follow-up visits to maximize his success with intensive lifestyle modifications for his multiple health conditions.   Objective:   Blood pressure 132/79, pulse 83, temperature 98.3 F  (36.8 C), height 5\' 11"  (1.803 m), weight 278 lb (126.1 kg), SpO2 96 %. Body mass index is 38.77 kg/m.  General: Cooperative, alert, well developed, in no acute distress. HEENT: Conjunctivae and lids unremarkable. Cardiovascular: Regular rhythm.  Lungs: Normal work of breathing. Neurologic: No focal deficits.   Lab Results  Component Value Date   CREATININE 1.02 03/26/2020   BUN 20 03/26/2020   NA 138 03/26/2020   K 4.2 03/26/2020   CL 101 03/26/2020   CO2 29 03/26/2020   Lab Results  Component Value Date   ALT 19 03/26/2020   AST 16 03/26/2020   ALKPHOS 119 03/26/2020   BILITOT 0.6 03/26/2020   Lab Results  Component Value Date   HGBA1C 8.2 (H) 03/14/2020   HGBA1C 6.2 (H) 08/10/2018   Lab Results  Component Value Date   INSULIN 28.0 (H) 03/14/2020   INSULIN 24.6 08/10/2018   Lab Results  Component Value Date   TSH 2.960 03/14/2020   Lab Results  Component Value Date   CHOL 166 03/14/2020   HDL 39 (L) 03/14/2020   LDLCALC 99 03/14/2020   TRIG 160 (H) 03/14/2020   Lab Results  Component Value Date   WBC 6.0 03/26/2020   HGB 12.5 (L) 03/26/2020   HCT 36.3 (L) 03/26/2020   MCV 89.9 03/26/2020   PLT 180 03/26/2020   No results found for: IRON, TIBC, FERRITIN  Attestation Statements:   Reviewed by clinician on day of visit: allergies, medications, problem list, medical history, surgical history, family history, social history, and previous encounter notes.   I, Trixie Dredge, am acting as transcriptionist for Dennard Nip, MD.  I have reviewed the above documentation for accuracy and completeness, and I agree with the above. -  Dennard Nip, MD

## 2020-04-01 NOTE — Progress Notes (Signed)
  Echocardiogram 2D Echocardiogram has been performed.  Fidel Levy 04/01/2020, 9:57 AM

## 2020-04-02 ENCOUNTER — Inpatient Hospital Stay: Payer: BC Managed Care – PPO

## 2020-04-02 ENCOUNTER — Inpatient Hospital Stay: Payer: BC Managed Care – PPO | Attending: Hematology & Oncology

## 2020-04-02 ENCOUNTER — Other Ambulatory Visit: Payer: Self-pay | Admitting: Hematology & Oncology

## 2020-04-02 VITALS — BP 126/74 | HR 86 | Temp 98.9°F | Resp 17

## 2020-04-02 DIAGNOSIS — R509 Fever, unspecified: Secondary | ICD-10-CM | POA: Diagnosis not present

## 2020-04-02 DIAGNOSIS — Z5112 Encounter for antineoplastic immunotherapy: Secondary | ICD-10-CM | POA: Diagnosis not present

## 2020-04-02 DIAGNOSIS — C8223 Follicular lymphoma grade III, unspecified, intra-abdominal lymph nodes: Secondary | ICD-10-CM | POA: Insufficient documentation

## 2020-04-02 DIAGNOSIS — Z5111 Encounter for antineoplastic chemotherapy: Secondary | ICD-10-CM | POA: Insufficient documentation

## 2020-04-02 DIAGNOSIS — Z79899 Other long term (current) drug therapy: Secondary | ICD-10-CM | POA: Diagnosis not present

## 2020-04-02 DIAGNOSIS — R0981 Nasal congestion: Secondary | ICD-10-CM | POA: Diagnosis not present

## 2020-04-02 LAB — CMP (CANCER CENTER ONLY)
ALT: 21 U/L (ref 0–44)
AST: 19 U/L (ref 15–41)
Albumin: 4.2 g/dL (ref 3.5–5.0)
Alkaline Phosphatase: 105 U/L (ref 38–126)
Anion gap: 6 (ref 5–15)
BUN: 20 mg/dL (ref 6–20)
CO2: 29 mmol/L (ref 22–32)
Calcium: 9.4 mg/dL (ref 8.9–10.3)
Chloride: 103 mmol/L (ref 98–111)
Creatinine: 0.98 mg/dL (ref 0.61–1.24)
GFR, Estimated: >60 mL/min (ref >60)
Glucose, Bld: 150 mg/dL — ABNORMAL HIGH (ref 70–99)
Potassium: 4 mmol/L (ref 3.5–5.1)
Sodium: 138 mmol/L (ref 135–145)
Total Bilirubin: 0.5 mg/dL (ref 0.3–1.2)
Total Protein: 6.6 g/dL (ref 6.5–8.1)

## 2020-04-02 LAB — CBC WITH DIFFERENTIAL (CANCER CENTER ONLY)
Abs Immature Granulocytes: 0.01 10*3/uL (ref 0.00–0.07)
Basophils Absolute: 0 10*3/uL (ref 0.0–0.1)
Basophils Relative: 0 %
Eosinophils Absolute: 0.1 10*3/uL (ref 0.0–0.5)
Eosinophils Relative: 2 %
HCT: 35.4 % — ABNORMAL LOW (ref 39.0–52.0)
Hemoglobin: 12.3 g/dL — ABNORMAL LOW (ref 13.0–17.0)
Immature Granulocytes: 0 %
Lymphocytes Relative: 10 %
Lymphs Abs: 0.6 10*3/uL — ABNORMAL LOW (ref 0.7–4.0)
MCH: 31.3 pg (ref 26.0–34.0)
MCHC: 34.7 g/dL (ref 30.0–36.0)
MCV: 90.1 fL (ref 80.0–100.0)
Monocytes Absolute: 0.6 10*3/uL (ref 0.1–1.0)
Monocytes Relative: 11 %
Neutro Abs: 4.5 10*3/uL (ref 1.7–7.7)
Neutrophils Relative %: 77 %
Platelet Count: 210 10*3/uL (ref 150–400)
RBC: 3.93 MIL/uL — ABNORMAL LOW (ref 4.22–5.81)
RDW: 12.8 % (ref 11.5–15.5)
WBC Count: 5.8 10*3/uL (ref 4.0–10.5)
nRBC: 0 % (ref 0.0–0.2)

## 2020-04-02 MED ORDER — ACETAMINOPHEN 325 MG PO TABS
650.0000 mg | ORAL_TABLET | Freq: Once | ORAL | Status: AC
  Administered 2020-04-02: 650 mg via ORAL

## 2020-04-02 MED ORDER — HEPARIN SOD (PORK) LOCK FLUSH 100 UNIT/ML IV SOLN
500.0000 [IU] | Freq: Once | INTRAVENOUS | Status: AC | PRN
  Administered 2020-04-02: 500 [IU]
  Filled 2020-04-02: qty 5

## 2020-04-02 MED ORDER — PALONOSETRON HCL INJECTION 0.25 MG/5ML
0.2500 mg | Freq: Once | INTRAVENOUS | Status: AC
  Administered 2020-04-02: 0.25 mg via INTRAVENOUS

## 2020-04-02 MED ORDER — DIPHENHYDRAMINE HCL 50 MG/ML IJ SOLN
12.5000 mg | Freq: Once | INTRAMUSCULAR | Status: AC
  Administered 2020-04-02: 12.5 mg via INTRAVENOUS

## 2020-04-02 MED ORDER — PROCHLORPERAZINE MALEATE 10 MG PO TABS: 10.0000 mg | ORAL_TABLET | Freq: Four times a day (QID) | ORAL | 6 refills | Status: DC | PRN

## 2020-04-02 MED ORDER — PALONOSETRON HCL INJECTION 0.25 MG/5ML
INTRAVENOUS | Status: AC
  Filled 2020-04-02: qty 5

## 2020-04-02 MED ORDER — VINCRISTINE SULFATE CHEMO INJECTION 1 MG/ML
1.5000 mg | Freq: Once | INTRAVENOUS | Status: AC
  Administered 2020-04-02: 1.5 mg via INTRAVENOUS
  Filled 2020-04-02: qty 1.5

## 2020-04-02 MED ORDER — ONDANSETRON HCL 8 MG PO TABS: 8.0000 mg | ORAL_TABLET | Freq: Two times a day (BID) | ORAL | 1 refills | Status: DC | PRN

## 2020-04-02 MED ORDER — SODIUM CHLORIDE 0.9% FLUSH
10.0000 mL | INTRAVENOUS | Status: DC | PRN
  Administered 2020-04-02: 10 mL
  Filled 2020-04-02: qty 10

## 2020-04-02 MED ORDER — OBINUTUZUMAB CHEMO INJECTION 1000 MG/40ML
1000.0000 mg | Freq: Once | INTRAVENOUS | Status: AC
  Administered 2020-04-02: 1000 mg via INTRAVENOUS
  Filled 2020-04-02: qty 40

## 2020-04-02 MED ORDER — SODIUM CHLORIDE 0.9 % IV SOLN
10.0000 mg | Freq: Once | INTRAVENOUS | Status: AC
  Administered 2020-04-02: 10 mg via INTRAVENOUS
  Filled 2020-04-02: qty 10

## 2020-04-02 MED ORDER — LORAZEPAM 0.5 MG PO TABS: 0.5000 mg | ORAL_TABLET | Freq: Four times a day (QID) | ORAL | 0 refills | Status: DC | PRN

## 2020-04-02 MED ORDER — PREDNISONE 20 MG PO TABS: 60.0000 mg | ORAL_TABLET | Freq: Every day | ORAL | 0 refills | Status: DC

## 2020-04-02 MED ORDER — SODIUM CHLORIDE 0.9 % IV SOLN
Freq: Once | INTRAVENOUS | Status: AC
  Filled 2020-04-02: qty 250

## 2020-04-02 MED ORDER — DIPHENHYDRAMINE HCL 50 MG/ML IJ SOLN
INTRAMUSCULAR | Status: AC
  Filled 2020-04-02: qty 1

## 2020-04-02 MED ORDER — SODIUM CHLORIDE 0.9 % IV SOLN
40.0000 mg | Freq: Once | INTRAVENOUS | Status: AC
  Administered 2020-04-02: 40 mg via INTRAVENOUS
  Filled 2020-04-02: qty 4

## 2020-04-02 MED ORDER — SODIUM CHLORIDE 0.9 % IV SOLN
150.0000 mg | Freq: Once | INTRAVENOUS | Status: AC
  Administered 2020-04-02: 150 mg via INTRAVENOUS
  Filled 2020-04-02: qty 150

## 2020-04-02 MED ORDER — SODIUM CHLORIDE 0.9 % IV SOLN
750.0000 mg/m2 | Freq: Once | INTRAVENOUS | Status: AC
  Administered 2020-04-02: 1900 mg via INTRAVENOUS
  Filled 2020-04-02: qty 95

## 2020-04-02 MED ORDER — DOXORUBICIN HCL CHEMO IV INJECTION 2 MG/ML
50.0000 mg/m2 | Freq: Once | INTRAVENOUS | Status: AC
  Administered 2020-04-02: 126 mg via INTRAVENOUS
  Filled 2020-04-02: qty 63

## 2020-04-02 MED ORDER — ACETAMINOPHEN 325 MG PO TABS
ORAL_TABLET | ORAL | Status: AC
  Filled 2020-04-02: qty 2

## 2020-04-02 NOTE — Patient Instructions (Addendum)
Cyclophosphamide Injection What is this medicine? CYCLOPHOSPHAMIDE (sye kloe FOSS fa mide) is a chemotherapy drug. It slows the growth of cancer cells. This medicine is used to treat many types of cancer like lymphoma, myeloma, leukemia, breast cancer, and ovarian cancer, to name a few. This medicine may be used for other purposes; ask your health care provider or pharmacist if you have questions. COMMON BRAND NAME(S): Cytoxan, Neosar What should I tell my health care provider before I take this medicine? They need to know if you have any of these conditions:  heart disease  history of irregular heartbeat  infection  kidney disease  liver disease  low blood counts, like white cells, platelets, or red blood cells  on hemodialysis  recent or ongoing radiation therapy  scarring or thickening of the lungs  trouble passing urine  an unusual or allergic reaction to cyclophosphamide, other medicines, foods, dyes, or preservatives  pregnant or trying to get pregnant  breast-feeding How should I use this medicine? This drug is usually given as an injection into a vein or muscle or by infusion into a vein. It is administered in a hospital or clinic by a specially trained health care professional. Talk to your pediatrician regarding the use of this medicine in children. Special care may be needed. Overdosage: If you think you have taken too much of this medicine contact a poison control center or emergency room at once. NOTE: This medicine is only for you. Do not share this medicine with others. What if I miss a dose? It is important not to miss your dose. Call your doctor or health care professional if you are unable to keep an appointment. What may interact with this medicine?  amphotericin B  azathioprine  certain antivirals for HIV or hepatitis  certain medicines for blood pressure, heart disease, irregular heart beat  certain medicines that treat or prevent blood clots  like warfarin  certain other medicines for cancer  cyclosporine  etanercept  indomethacin  medicines that relax muscles for surgery  medicines to increase blood counts  metronidazole This list may not describe all possible interactions. Give your health care provider a list of all the medicines, herbs, non-prescription drugs, or dietary supplements you use. Also tell them if you smoke, drink alcohol, or use illegal drugs. Some items may interact with your medicine. What should I watch for while using this medicine? Your condition will be monitored carefully while you are receiving this medicine. You may need blood work done while you are taking this medicine. Drink water or other fluids as directed. Urinate often, even at night. Some products may contain alcohol. Ask your health care professional if this medicine contains alcohol. Be sure to tell all health care professionals you are taking this medicine. Certain medicines, like metronidazole and disulfiram, can cause an unpleasant reaction when taken with alcohol. The reaction includes flushing, headache, nausea, vomiting, sweating, and increased thirst. The reaction can last from 30 minutes to several hours. Do not become pregnant while taking this medicine or for 1 year after stopping it. Women should inform their health care professional if they wish to become pregnant or think they might be pregnant. Men should not father a child while taking this medicine and for 4 months after stopping it. There is potential for serious side effects to an unborn child. Talk to your health care professional for more information. Do not breast-feed an infant while taking this medicine or for 1 week after stopping it. This medicine has  caused ovarian failure in some women. This medicine may make it more difficult to get pregnant. Talk to your health care professional if you are concerned about your fertility. This medicine has caused decreased sperm  counts in some men. This may make it more difficult to father a child. Talk to your health care professional if you are concerned about your fertility. Call your health care professional for advice if you get a fever, chills, or sore throat, or other symptoms of a cold or flu. Do not treat yourself. This medicine decreases your body's ability to fight infections. Try to avoid being around people who are sick. Avoid taking medicines that contain aspirin, acetaminophen, ibuprofen, naproxen, or ketoprofen unless instructed by your health care professional. These medicines may hide a fever. Talk to your health care professional about your risk of cancer. You may be more at risk for certain types of cancer if you take this medicine. If you are going to need surgery or other procedure, tell your health care professional that you are using this medicine. Be careful brushing or flossing your teeth or using a toothpick because you may get an infection or bleed more easily. If you have any dental work done, tell your dentist you are receiving this medicine. What side effects may I notice from receiving this medicine? Side effects that you should report to your doctor or health care professional as soon as possible:  allergic reactions like skin rash, itching or hives, swelling of the face, lips, or tongue  breathing problems  nausea, vomiting  signs and symptoms of bleeding such as bloody or black, tarry stools; red or dark brown urine; spitting up blood or brown material that looks like coffee grounds; red spots on the skin; unusual bruising or bleeding from the eyes, gums, or nose  signs and symptoms of heart failure like fast, irregular heartbeat, sudden weight gain; swelling of the ankles, feet, hands  signs and symptoms of infection like fever; chills; cough; sore throat; pain or trouble passing urine  signs and symptoms of kidney injury like trouble passing urine or change in the amount of  urine  signs and symptoms of liver injury like dark yellow or brown urine; general ill feeling or flu-like symptoms; light-colored stools; loss of appetite; nausea; right upper belly pain; unusually weak or tired; yellowing of the eyes or skin Side effects that usually do not require medical attention (report to your doctor or health care professional if they continue or are bothersome):  confusion  decreased hearing  diarrhea  facial flushing  hair loss  headache  loss of appetite  missed menstrual periods  signs and symptoms of low red blood cells or anemia such as unusually weak or tired; feeling faint or lightheaded; falls  skin discoloration This list may not describe all possible side effects. Call your doctor for medical advice about side effects. You may report side effects to FDA at 1-800-FDA-1088. Where should I keep my medicine? This drug is given in a hospital or clinic and will not be stored at home. NOTE: This sheet is a summary. It may not cover all possible information. If you have questions about this medicine, talk to your doctor, pharmacist, or health care provider.  2021 Elsevier/Gold Standard (2018-10-24 09:53:29) Vincristine injection What is this medicine? VINCRISTINE (vin KRIS teen) is a chemotherapy drug. It slows the growth of cancer cells. This medicine is used to treat many types of cancer like Hodgkin's disease, leukemia, non-Hodgkin's lymphoma, neuroblastoma (brain cancer),  rhabdomyosarcoma, and Wilms' tumor. This medicine may be used for other purposes; ask your health care provider or pharmacist if you have questions. COMMON BRAND NAME(S): Oncovin, Vincasar PFS What should I tell my health care provider before I take this medicine? They need to know if you have any of these conditions:  blood disorders  gout  infection (especially chickenpox, cold sores, or herpes)  kidney disease  liver disease  lung disease  nervous system disease  like Charcot-Marie-Tooth (CMT)  recent or ongoing radiation therapy  an unusual or allergic reaction to vincristine, other chemotherapy agents, other medicines, foods, dyes, or preservatives  pregnant or trying to get pregnant  breast-feeding How should I use this medicine? This drug is given as an infusion into a vein. It is administered in a hospital or clinic by a specially trained health care professional. If you have pain, swelling, burning, or any unusual feeling around the site of your injection, tell your health care professional right away. Talk to your pediatrician regarding the use of this medicine in children. While this drug may be prescribed for selected conditions, precautions do apply. Overdosage: If you think you have taken too much of this medicine contact a poison control center or emergency room at once. NOTE: This medicine is only for you. Do not share this medicine with others. What if I miss a dose? It is important not to miss your dose. Call your doctor or health care professional if you are unable to keep an appointment. What may interact with this medicine?  certain medicines for fungal infections like itraconazole, ketoconazole, posaconazole, voriconazole  certain medicines for seizures like phenytoin This list may not describe all possible interactions. Give your health care provider a list of all the medicines, herbs, non-prescription drugs, or dietary supplements you use. Also tell them if you smoke, drink alcohol, or use illegal drugs. Some items may interact with your medicine. What should I watch for while using this medicine? This drug may make you feel generally unwell. This is not uncommon, as chemotherapy can affect healthy cells as well as cancer cells. Report any side effects. Continue your course of treatment even though you feel ill unless your doctor tells you to stop. You may need blood work done while you are taking this medicine. This medicine  will cause constipation. Try to have a bowel movement at least every 2 to 3 days. If you do not have a bowel movement for 3 days, call your doctor or health care professional. In some cases, you may be given additional medicines to help with side effects. Follow all directions for their use. Do not become pregnant while taking this medicine. Women should inform their doctor if they wish to become pregnant or think they might be pregnant. There is a potential for serious side effects to an unborn child. Talk to your health care professional or pharmacist for more information. Do not breast-feed an infant while taking this medicine. This medicine may make it more difficult to get pregnant or to father a child. Talk to your healthcare professional if you are concerned about your fertility. What side effects may I notice from receiving this medicine? Side effects that you should report to your doctor or health care professional as soon as possible:  allergic reactions like skin rash, itching or hives, swelling of the face, lips, or tongue  breathing problems  confusion or changes in emotions or moods  constipation  cough  mouth sores  muscle weakness  nausea  and vomiting  pain, swelling, redness or irritation at the injection site  pain, tingling, numbness in the hands or feet Famotidine injection What is this medicine? FAMOTIDINE (fa MOE ti deen) is a type of antihistamine that blocks the release of stomach acid. It is used to treat stomach or intestinal ulcers. It can relieve ulcer pain and discomfort, and the heartburn from acid reflux. This medicine may be used for other purposes; ask your health care provider or pharmacist if you have questions. COMMON BRAND NAME(S): Pepcid What should I tell my health care provider before I take this medicine? They need to know if you have any of these conditions: kidney or liver disease an unusual or allergic reaction to famotidine, other  medicines, foods, dyes, or preservatives pregnant or trying to get pregnant breast-feeding How should I use this medicine? This medicine is for infusion into a vein. It is given by a health care professional in a hospital or clinic setting. Talk to your pediatrician regarding the use of this medicine in children. Special care may be needed. Overdosage: If you think you have taken too much of this medicine contact a poison control center or emergency room at once. NOTE: This medicine is only for you. Do not share this medicine with others. What if I miss a dose? This does not apply. What may interact with this medicine? delavirdine itraconazole ketoconazole This list may not describe all possible interactions. Give your health care provider a list of all the medicines, herbs, non-prescription drugs, or dietary supplements you use. Also tell them if you smoke, drink alcohol, or use illegal drugs. Some items may interact with your medicine. What should I watch for while using this medicine? Tell your doctor or health care professional if your condition does not start to get better or gets worse. Do not take with aspirin, ibuprofen, or other antiinflammatory medicines. These can aggravate your condition. Do not smoke cigarettes or drink alcohol. These increase irritation in your stomach and can increase the time it will take for ulcers to heal. Cigarettes and alcohol can also worsen acid reflux or heartburn. If you get black, tarry stools or vomit up what looks like coffee grounds, call your doctor or health care professional at once. You may have a bleeding ulcer. This medicine may cause a decrease in vitamin B12. You should make sure that you get enough vitamin B12 while you are taking this medicine. Discuss the foods you eat and the vitamins you take with your health care professional. What side effects may I notice from receiving this medicine? Side effects that you should report to your doctor  or health care professional as soon as possible: allergic reactions like skin rash, itching or hives, swelling of the face, lips, or tongue agitation, nervousness confusion hallucinations Side effects that usually do not require medical attention (report to your doctor or health care professional if they continue or are bothersome): constipation diarrhea dizziness headache This list may not describe all possible side effects. Call your doctor for medical advice about side effects. You may report side effects to FDA at 1-800-FDA-1088. Where should I keep my medicine? This medicine is given in a hospital or clinic. You will not be given this medicine to store at home. NOTE: This sheet is a summary. It may not cover all possible information. If you have questions about this medicine, talk to your doctor, pharmacist, or health care provider.  2021 Elsevier/Gold Standard (2016-09-04 13:16:46) Obinutuzumab injection What is this medicine? OBINUTUZUMAB (  OH bi nue TOOZ ue mab) is a monoclonal antibody. It is used to treat chronic lymphocytic leukemia (CLL) and a type of non-Hodgkin lymphoma (NHL), follicular lymphoma. This medicine may be used for other purposes; ask your health care provider or pharmacist if you have questions. COMMON BRAND NAME(S): GAZYVA What should I tell my health care provider before I take this medicine? They need to know if you have any of these conditions: infection (especially a virus infection such as hepatitis B virus) lung or breathing disease heart disease take medicines that treat or prevent blood clots an unusual or allergic reaction to obinutuzumab, other medicines, foods, dyes, or preservatives pregnant or trying to get pregnant breast-feeding How should I use this medicine? This medicine is for infusion into a vein. It is given by a health care professional in a hospital or clinic setting. Talk to your pediatrician regarding the use of this medicine in  children. Special care may be needed. Overdosage: If you think you have taken too much of this medicine contact a poison control center or emergency room at once. NOTE: This medicine is only for you. Do not share this medicine with others. What if I miss a dose? Keep appointments for follow-up doses as directed. It is important not to miss your dose. Call your doctor or health care professional if you are unable to keep an appointment. What may interact with this medicine? live virus vaccines This list may not describe all possible interactions. Give your health care provider a list of all the medicines, herbs, non-prescription drugs, or dietary supplements you use. Also tell them if you smoke, drink alcohol, or use illegal drugs. Some items may interact with your medicine. What should I watch for while using this medicine? Report any side effects that you notice during your treatment right away, such as changes in your breathing, fever, chills, dizziness or lightheadedness. These effects are more common with the first dose. Visit your prescriber or health care professional for checks on your progress. You will need to have regular blood work. Report any other side effects. The side effects of this medicine can continue after you finish your treatment. Continue your course of treatment even though you feel ill unless your doctor tells you to stop. Call your doctor or health care professional for advice if you get a fever, chills or sore throat, or other symptoms of a cold or flu. Do not treat yourself. This drug decreases your body's ability to fight infections. Try to avoid being around people who are sick. This medicine may increase your risk to bruise or bleed. Call your doctor or health care professional if you notice any unusual bleeding. Do not become pregnant while taking this medicine or for 6 months after stopping it. Women should inform their doctor if they wish to become pregnant or think  they might be pregnant. There is a potential for serious side effects to an unborn child. Talk to your health care professional or pharmacist for more information. Do not breast-feed an infant while taking this medicine or for 6 months after stopping it. What side effects may I notice from receiving this medicine? Side effects that you should report to your doctor or health care professional as soon as possible: allergic reactions like skin rash, itching or hives, swelling of the face, lips, or tongue breathing problems changes in vision chest pain or chest tightness confusion dizziness loss of balance or coordination low blood counts - this medicine may decrease the number  of white blood cells, red blood cells and platelets. You may be at increased risk for infections and bleeding. signs of decreased platelets or bleeding - bruising, pinpoint red spots on the skin, black, tarry stools, blood in the urine signs of infection - fever or chills, cough, sore throat, pain or trouble passing urine signs and symptoms of liver injury like dark yellow or brown urine; general ill feeling or flu-like symptoms; light-colored stools; loss of appetite; nausea; right upper belly pain; unusually weak or tired; yellowing of the eyes or skin trouble speaking or understanding trouble walking vomiting Side effects that usually do not require medical attention (report to your doctor or health care professional if they continue or are bothersome): constipation joint pain muscle pain This list may not describe all possible side effects. Call your doctor for medical advice about side effects. You may report side effects to FDA at 1-800-FDA-1088. Where should I keep my medicine? This drug is only given in a hospital or clinic and will not be stored at home. NOTE: This sheet is a summary. It may not cover all possible information. If you have questions about this medicine, talk to your doctor, pharmacist, or health  care provider.  2021 [employer]/Gold Standard (2018-05-03 15:34:53) Prednisone tablets What is this medicine? PREDNISONE (PRED ni sone) is a corticosteroid. It is commonly used to treat inflammation of the skin, joints, lungs, and other organs. Common conditions treated include asthma, allergies, and arthritis. It is also used for other conditions, such as blood disorders and diseases of the adrenal glands. This medicine may be used for other purposes; ask your health care provider or pharmacist if you have questions. COMMON BRAND NAME(S): Deltasone, Predone, Sterapred, Sterapred DS What should I tell my health care provider before I take this medicine? They need to know if you have any of these conditions: Cushing's syndrome diabetes glaucoma heart disease high blood pressure infection (especially a virus infection such as chickenpox, cold sores, or herpes) kidney disease liver disease mental illness myasthenia gravis osteoporosis seizures stomach or intestine problems thyroid disease an unusual or allergic reaction to lactose, prednisone, other medicines, foods, dyes, or preservatives pregnant or trying to get pregnant breast-feeding How should I use this medicine? Take this medicine by mouth with a glass of water. Follow the directions on the prescription label. Take this medicine with food. If you are taking this medicine once a day, take it in the morning. Do not take more medicine than you are told to take. Do not suddenly stop taking your medicine because you may develop a severe reaction. Your doctor will tell you how much medicine to take. If your doctor wants you to stop the medicine, the dose may be slowly lowered over time to avoid any side effects. Talk to your pediatrician regarding the use of this medicine in children. Special care may be needed. Overdosage: If you think you have taken too much of this medicine contact a poison control center or emergency room at  once. NOTE: This medicine is only for you. Do not share this medicine with others. What if I miss a dose? If you miss a dose, take it as soon as you can. If it is almost time for your next dose, talk to your doctor or health care professional. You may need to miss a dose or take an extra dose. Do not take double or extra doses without advice. What may interact with this medicine? Do not take this medicine with any of the  following medications: metyrapone mifepristone This medicine may also interact with the following medications: aminoglutethimide amphotericin B aspirin and aspirin-like medicines barbiturates certain medicines for diabetes, like glipizide or glyburide cholestyramine cholinesterase inhibitors cyclosporine digoxin diuretics ephedrine male hormones, like estrogens and birth control pills isoniazid ketoconazole NSAIDS, medicines for pain and inflammation, like ibuprofen or naproxen phenytoin rifampin toxoids vaccines warfarin This list may not describe all possible interactions. Give your health care provider a list of all the medicines, herbs, non-prescription drugs, or dietary supplements you use. Also tell them if you smoke, drink alcohol, or use illegal drugs. Some items may interact with your medicine. What should I watch for while using this medicine? Visit your doctor or health care professional for regular checks on your progress. If you are taking this medicine over a prolonged period, carry an identification card with your name and address, the type and dose of your medicine, and your doctor's name and address. This medicine may increase your risk of getting an infection. Tell your doctor or health care professional if you are around anyone with measles or chickenpox, or if you develop sores or blisters that do not heal properly. If you are going to have surgery, tell your doctor or health care professional that you have taken this medicine within the last  twelve months. Ask your doctor or health care professional about your diet. You may need to lower the amount of salt you eat. This medicine may increase blood sugar. Ask your healthcare provider if changes in diet or medicines are needed if you have diabetes. What side effects may I notice from receiving this medicine? Side effects that you should report to your doctor or health care professional as soon as possible: allergic reactions like skin rash, itching or hives, swelling of the face, lips, or tongue changes in emotions or moods changes in vision depressed mood eye pain fever or chills, cough, sore throat, pain or difficulty passing urine signs and symptoms of high blood sugar such as being more thirsty or hungry or having to urinate more than normal. You may also feel very tired or have blurry vision. swelling of ankles, feet Side effects that usually do not require medical attention (report to your doctor or health care professional if they continue or are bothersome): confusion, excitement, restlessness headache nausea, vomiting skin problems, acne, thin and shiny skin trouble sleeping weight gain This list may not describe all possible side effects. Call your doctor for medical advice about side effects. You may report side effects to FDA at 1-800-FDA-1088. Where should I keep my medicine? Keep out of the reach of children. Store at room temperature between 15 and 30 degrees C (59 and 86 degrees F). Protect from light. Keep container tightly closed. Throw away any unused medicine after the expiration date. NOTE: This sheet is a summary. It may not cover all possible information. If you have questions about this medicine, talk to your doctor, pharmacist, or health care provider.  2021 Elsevier/Gold Standard (2017-10-19 10:54:22)  problems with balance, talking, walking  seizures  stomach pain  trouble passing urine or change in the amount of urine Side effects that usually  do not require medical attention (report to your doctor or health care professional if they continue or are bothersome):  diarrhea  hair loss  jaw pain  loss of appetite This list may not describe all possible side effects. Call your doctor for medical advice about side effects. You may report side effects to FDA at 1-800-FDA-1088. Where  should I keep my medicine? This drug is given in a hospital or clinic and will not be stored at home. NOTE: This sheet is a summary. It may not cover all possible information. If you have questions about this medicine, talk to your doctor, pharmacist, or health care provider.  2021 Elsevier/Gold Standard (2018-12-20 17:05:13) Doxorubicin injection What is this medicine? DOXORUBICIN (dox oh ROO bi sin) is a chemotherapy drug. It is used to treat many kinds of cancer like leukemia, lymphoma, neuroblastoma, sarcoma, and Wilms' tumor. It is also used to treat bladder cancer, breast cancer, lung cancer, ovarian cancer, stomach cancer, and thyroid cancer. This medicine may be used for other purposes; ask your health care provider or pharmacist if you have questions. COMMON BRAND NAME(S): Adriamycin, Adriamycin PFS, Adriamycin RDF, Rubex What should I tell my health care provider before I take this medicine? They need to know if you have any of these conditions:  heart disease  history of low blood counts caused by a medicine  liver disease  recent or ongoing radiation therapy  an unusual or allergic reaction to doxorubicin, other chemotherapy agents, other medicines, foods, dyes, or preservatives  pregnant or trying to get pregnant  breast-feeding How should I use this medicine? This drug is given as an infusion into a vein. It is administered in a hospital or clinic by a specially trained health care professional. If you have pain, swelling, burning or any unusual feeling around the site of your injection, tell your health care professional right  away. Talk to your pediatrician regarding the use of this medicine in children. Special care may be needed. Overdosage: If you think you have taken too much of this medicine contact a poison control center or emergency room at once. NOTE: This medicine is only for you. Do not share this medicine with others. What if I miss a dose? It is important not to miss your dose. Call your doctor or health care professional if you are unable to keep an appointment. What may interact with this medicine? This medicine may interact with the following medications:  6-mercaptopurine  paclitaxel  phenytoin  St. John's Wort  trastuzumab  verapamil This list may not describe all possible interactions. Give your health care provider a list of all the medicines, herbs, non-prescription drugs, or dietary supplements you use. Also tell them if you smoke, drink alcohol, or use illegal drugs. Some items may interact with your medicine. What should I watch for while using this medicine? This drug may make you feel generally unwell. This is not uncommon, as chemotherapy can affect healthy cells as well as cancer cells. Report any side effects. Continue your course of treatment even though you feel ill unless your doctor tells you to stop. There is a maximum amount of this medicine you should receive throughout your life. The amount depends on the medical condition being treated and your overall health. Your doctor will watch how much of this medicine you receive in your lifetime. Tell your doctor if you have taken this medicine before. You may need blood work done while you are taking this medicine. Your urine may turn red for a few days after your dose. This is not blood. If your urine is dark or brown, call your doctor. In some cases, you may be given additional medicines to help with side effects. Follow all directions for their use. Call your doctor or health care professional for advice if you get a fever,  chills or sore throat,  or other symptoms of a cold or flu. Do not treat yourself. This drug decreases your body's ability to fight infections. Try to avoid being around people who are sick. This medicine may increase your risk to bruise or bleed. Call your doctor or health care professional if you notice any unusual bleeding. Talk to your doctor about your risk of cancer. You may be more at risk for certain types of cancers if you take this medicine. Do not become pregnant while taking this medicine or for 6 months after stopping it. Women should inform their doctor if they wish to become pregnant or think they might be pregnant. Men should not father a child while taking this medicine and for 6 months after stopping it. There is a potential for serious side effects to an unborn child. Talk to your health care professional or pharmacist for more information. Do not breast-feed an infant while taking this medicine. This medicine has caused ovarian failure in some women and reduced sperm counts in some men This medicine may interfere with the ability to have a child. Talk with your doctor or health care professional if you are concerned about your fertility. This medicine may cause a decrease in Co-Enzyme Q-10. You should make sure that you get enough Co-Enzyme Q-10 while you are taking this medicine. Discuss the foods you eat and the vitamins you take with your health care professional. What side effects may I notice from receiving this medicine? Side effects that you should report to your doctor or health care professional as soon as possible:  allergic reactions like skin rash, itching or hives, swelling of the face, lips, or tongue  breathing problems  chest pain  fast or irregular heartbeat  low blood counts - this medicine may decrease the number of white blood cells, red blood cells and platelets. You may be at increased risk for infections and bleeding.  pain, redness, or irritation at site  where injected  signs of infection - fever or chills, cough, sore throat, pain or difficulty passing urine  signs of decreased platelets or bleeding - bruising, pinpoint red spots on the skin, black, tarry stools, blood in the urine  swelling of the ankles, feet, hands  tiredness  weakness Side effects that usually do not require medical attention (report to your doctor or health care professional if they continue or are bothersome):  diarrhea  hair loss  mouth sores  nail discoloration or damage  nausea  red colored urine  vomiting This list may not describe all possible side effects. Call your doctor for medical advice about side effects. You may report side effects to FDA at 1-800-FDA-1088. Where should I keep my medicine? This drug is given in a hospital or clinic and will not be stored at home. NOTE: This sheet is a summary. It may not cover all possible information. If you have questions about this medicine, talk to your doctor, pharmacist, or health care provider.  2021 Elsevier/Gold Standard (2016-09-02 11:01:26)  Acetaminophen tablets or caplets What is this medicine? ACETAMINOPHEN (a set a MEE noe fen) is a pain reliever. It is used to treat mild pain and fever. This medicine may be used for other purposes; ask your health care provider or pharmacist if you have questions. COMMON BRAND NAME(S): Aceta, Actamin, Anacin Aspirin Free, Genapap, Genebs, Mapap, Pain & Fever, Pain and Fever, PAIN RELIEF, PAIN RELIEF Extra Strength, Pain Reliever, Panadol, PHARBETOL, Plus PHARMA, Q-Pap, Q-Pap Extra Strength, Tylenol, Tylenol CrushableTablet, Tylenol Extra Strength, Tylenol  Regular Strength, XS No Aspirin, XS Pain Reliever What should I tell my health care provider before I take this medicine? They need to know if you have any of these conditions:  if you often drink alcohol  liver disease  an unusual or allergic reaction to acetaminophen, other medicines, foods, dyes, or  preservatives  pregnant or trying to get pregnant  breast-feeding How should I use this medicine? Take this medicine by mouth with a glass of water. Follow the directions on the package or prescription label. Take your medicine at regular intervals. Do not take your medicine more often than directed. Talk to your pediatrician regarding the use of this medicine in children. While this drug may be prescribed for children as young as 65 years of age for selected conditions, precautions do apply. Overdosage: If you think you have taken too much of this medicine contact a poison control center or emergency room at once. NOTE: This medicine is only for you. Do not share this medicine with others. What if I miss a dose? If you miss a dose, take it as soon as you can. If it is almost time for your next dose, take only that dose. Do not take double or extra doses. What may interact with this medicine?  alcohol  imatinib  isoniazid  other medicines with acetaminophen This list may not describe all possible interactions. Give your health care provider a list of all the medicines, herbs, non-prescription drugs, or dietary supplements you use. Also tell them if you smoke, drink alcohol, or use illegal drugs. Some items may interact with your medicine. What should I watch for while using this medicine? Tell your doctor or health care professional if the pain lasts more than 10 days (5 days for children), if it gets worse, or if there is a new or different kind of pain. Also, check with your doctor if a fever lasts for more than 3 days. Do not take other medicines that contain acetaminophen with this medicine. Always read labels carefully. If you have questions, ask your doctor or pharmacist. If you take too much acetaminophen get medical help right away. Too much acetaminophen can be very dangerous and cause liver damage. Even if you do not have symptoms, it is important to get help right away. What side  effects may I notice from receiving this medicine? Side effects that you should report to your doctor or health care professional as soon as possible:  allergic reactions like skin rash, itching or hives, swelling of the face, lips, or tongue  breathing problems  fever or sore throat  redness, blistering, peeling or loosening of the skin, including inside the mouth  trouble passing urine or change in the amount of urine  unusual bleeding or bruising  unusually weak or tired  yellowing of the eyes or skin Side effects that usually do not require medical attention (report to your doctor or health care professional if they continue or are bothersome):  headache  nausea, stomach upset This list may not describe all possible side effects. Call your doctor for medical advice about side effects. You may report side effects to FDA at 1-800-FDA-1088. Where should I keep my medicine? Keep out of reach of children. Store at room temperature between 20 and 25 degrees C (68 and 77 degrees F). Protect from moisture and heat. Throw away any unused medicine after the expiration date. NOTE: This sheet is a summary. It may not cover all possible information. If you have questions  about this medicine, talk to your doctor, pharmacist, or health care provider.  2021 Elsevier/Gold Standard (2012-09-12 12:54:16) Diphenhydramine injection What is this medicine? DIPHENHYDRAMINE (dye fen HYE dra meen) is an antihistamine. It is used to treat the symptoms of an allergic reaction and motion sickness. It is also used to treat Parkinson's disease. This medicine may be used for other purposes; ask your health care provider or pharmacist if you have questions. COMMON BRAND NAME(S): Benadryl What should I tell my health care provider before I take this medicine? They need to know if you have any of these conditions:  asthma or lung disease  glaucoma  high blood pressure or heart disease  liver  disease  pain or difficulty passing urine  prostate trouble  ulcers or other stomach problems  an unusual or allergic reaction to diphenhydramine, antihistamines, other medicines foods, dyes, or preservatives  pregnant or trying to get pregnant  breast-feeding How should I use this medicine? This medicine is for injection into a vein or a muscle. It is usually given by a health care professional in a hospital or clinic setting. If you get this medicine at home, you will be taught how to prepare and give this medicine. Use exactly as directed. Take your medicine at regular intervals. Do not take your medicine more often than directed. It is important that you put your used needles and syringes in a special sharps container. Do not put them in a trash can. If you do not have a sharps container, call your pharmacist or healthcare provider to get one. Talk to your pediatrician regarding the use of this medicine in children. While this drug may be prescribed for selected conditions, precautions do apply. This medicine is not approved for use in newborns and premature babies. Patients over 71 years old may have a stronger reaction and need a smaller dose. Overdosage: If you think you have taken too much of this medicine contact a poison control center or emergency room at once. NOTE: This medicine is only for you. Do not share this medicine with others. What if I miss a dose? If you miss a dose, take it as soon as you can. If it is almost time for your next dose, take only that dose. Do not take double or extra doses. What may interact with this medicine? Do not take this medicine with any of the following medications:  MAOIs like Carbex, Eldepryl, Marplan, Nardil, and Parnate This medicine may also interact with the following medications:  alcohol  barbiturates, like phenobarbital  medicines for bladder spasm like oxybutynin, tolterodine  medicines for blood pressure  medicines for  depression, anxiety, or psychotic disturbances  medicines for movement abnormalities or Parkinson's disease  medicines for sleep  other medicines for cold, cough or allergy  some medicines for the stomach like chlordiazepoxide, dicyclomine This list may not describe all possible interactions. Give your health care provider a list of all the medicines, herbs, non-prescription drugs, or dietary supplements you use. Also tell them if you smoke, drink alcohol, or use illegal drugs. Some items may interact with your medicine. What should I watch for while using this medicine? Your condition will be monitored carefully while you are receiving this medicine. Tell your doctor or healthcare professional if your symptoms do not start to get better or if they get worse. You may get drowsy or dizzy. Do not drive, use machinery, or do anything that needs mental alertness until you know how this medicine affects you. Do  not stand or sit up quickly, especially if you are an older patient. This reduces the risk of dizzy or fainting spells. Alcohol may interfere with the effect of this medicine. Avoid alcoholic drinks. Your mouth may get dry. Chewing sugarless gum or sucking hard candy, and drinking plenty of water may help. Contact your doctor if the problem does not go away or is severe. What side effects may I notice from receiving this medicine? Side effects that you should report to your doctor or health care professional as soon as possible:  allergic reactions like skin rash, itching or hives, swelling of the face, lips, or tongue  breathing problems  changes in vision  chills  confused, agitated, nervous  irregular or fast heartbeat  low blood pressure  seizures  tremor  trouble passing urine  unusual bleeding or bruising  unusually weak or tired Side effects that usually do not require medical attention (report to your doctor or health care professional if they continue or are  bothersome):  constipation, diarrhea  drowsy  headache  loss of appetite  stomach upset, vomiting  sweating  thick mucous This list may not describe all possible side effects. Call your doctor for medical advice about side effects. You may report side effects to FDA at 1-800-FDA-1088. Where should I keep my medicine? Keep out of the reach of children. If you are using this medicine at home, you will be instructed on how to store this medicine. Throw away any unused medicine after the expiration date on the label. NOTE: This sheet is a summary. It may not cover all possible information. If you have questions about this medicine, talk to your doctor, pharmacist, or health care provider.  2021 Elsevier/Gold Standard (2007-05-10 14:28:35)

## 2020-04-02 NOTE — Patient Instructions (Signed)

## 2020-04-03 ENCOUNTER — Other Ambulatory Visit: Payer: BC Managed Care – PPO

## 2020-04-03 ENCOUNTER — Ambulatory Visit: Payer: BC Managed Care – PPO

## 2020-04-11 ENCOUNTER — Other Ambulatory Visit: Payer: Self-pay

## 2020-04-11 ENCOUNTER — Ambulatory Visit (INDEPENDENT_AMBULATORY_CARE_PROVIDER_SITE_OTHER): Payer: BC Managed Care – PPO | Admitting: Physician Assistant

## 2020-04-11 ENCOUNTER — Encounter (INDEPENDENT_AMBULATORY_CARE_PROVIDER_SITE_OTHER): Payer: Self-pay | Admitting: Physician Assistant

## 2020-04-11 VITALS — BP 123/79 | HR 113 | Temp 98.3°F | Ht 71.0 in | Wt 274.0 lb

## 2020-04-11 DIAGNOSIS — Z9189 Other specified personal risk factors, not elsewhere classified: Secondary | ICD-10-CM | POA: Diagnosis not present

## 2020-04-11 DIAGNOSIS — E1169 Type 2 diabetes mellitus with other specified complication: Secondary | ICD-10-CM | POA: Diagnosis not present

## 2020-04-11 DIAGNOSIS — E782 Mixed hyperlipidemia: Secondary | ICD-10-CM

## 2020-04-11 DIAGNOSIS — Z6838 Body mass index (BMI) 38.0-38.9, adult: Secondary | ICD-10-CM | POA: Diagnosis not present

## 2020-04-17 ENCOUNTER — Other Ambulatory Visit: Payer: Self-pay | Admitting: Hematology & Oncology

## 2020-04-17 ENCOUNTER — Other Ambulatory Visit (INDEPENDENT_AMBULATORY_CARE_PROVIDER_SITE_OTHER): Payer: Self-pay | Admitting: Family Medicine

## 2020-04-17 DIAGNOSIS — E782 Mixed hyperlipidemia: Secondary | ICD-10-CM

## 2020-04-17 NOTE — Progress Notes (Signed)
Chief Complaint:   OBESITY James Jennings is here to discuss his progress with his obesity treatment plan along with follow-up of his obesity related diagnoses. James Jennings is on the Category 4 Plan and keeping a food journal and adhering to recommended goals of 400-650 calories and 40 grams of protein at supper daily and states he is following his eating plan approximately 88% of the time. James Jennings states he is doing 0 minutes 0 times per week.  Today's visit was #: 3 Starting weight: 277 lbs Starting date: 03/14/2020 Today's weight: 274 lbs Today's date: 04/11/2020 Total lbs lost to date: 3 Total lbs lost since last in-office visit: 4  Interim History: James Jennings has been nauseous on and off recently due to chemo treatment. He has been able to eat on the plan despite nausea. His hunger is controlled. He notes he is slightly bored with breakfast.  Subjective:   1. Type 2 diabetes mellitus with other specified complication, without long-term current use of insulin ([name]) James Jennings is on Ozempic and metformin 500 mg daily. Last A1c was 8.2. His blood sugars are checked often with his Oncologist. Discussion of blood sugar levels and last CD results today.    2. Mixed hyperlipidemia James Jennings is on Lipitor, and he denies myalgias or chest pain.  3. At risk for nausea James Jennings is at risk for nausea due to chemo treatment.  Assessment/Plan:   1. Type 2 diabetes mellitus with other specified complication, without long-term current use of insulin ([name]) Good blood sugar control is important to decrease the likelihood of diabetic complications such as nephropathy, neuropathy, limb loss, blindness, coronary artery disease, and death. Intensive lifestyle modification including diet, exercise and weight loss are the first line of treatment for diabetes. James Jennings will continue Ozempic at 0.25 mg.   2. Mixed hyperlipidemia Cardiovascular risk and specific lipid/LDL goals reviewed. We discussed several lifestyle modifications today. James Jennings  will continue Lipitor, and will continue to work on diet, exercise and weight loss efforts. Orders and follow up as documented in patient record.   Counseling Intensive lifestyle modifications are the first line treatment for this issue. . Dietary changes: Increase soluble fiber. Decrease simple carbohydrates. . Exercise changes: Moderate to vigorous-intensity aerobic activity 150 minutes per week if tolerated. . Lipid-lowering medications: see documented in medical record.  3. At risk for nausea James Jennings was given approximately 15 minutes of nausea prevention counseling today. James Jennings is at risk for nausea due to his new or current medication. He was encouraged to titrate his medication slowly, make sure to stay hydrated, eat smaller portions throughout the day, and avoid high fat meals.   4. Class 2 severe obesity with serious comorbidity and body mass index (BMI) of 38.0 to 38.9 in adult, unspecified obesity type James Jennings is currently in the action stage of change. As such, his goal is to continue with weight loss efforts. He has agreed to the Category 4 Plan.   Exercise goals: No exercise has been prescribed at this time.  Behavioral modification strategies: meal planning and cooking strategies and keeping healthy foods in the home.  James Jennings has agreed to follow-up with our clinic in 2 weeks. He was informed of the importance of frequent follow-up visits to maximize his success with intensive lifestyle modifications for his multiple health conditions.   Objective:   Blood pressure 123/79, pulse (!) 113, temperature 98.3 F (36.8 C), height 5\' 11"  (1.803 m), weight 274 lb (124.3 kg), SpO2 96 %. Body mass index is 38.[place]  kg/m.  General: Cooperative, alert, well developed, in no acute distress. HEENT: Conjunctivae and lids unremarkable. Cardiovascular: Regular rhythm.  Lungs: Normal work of breathing. Neurologic: No focal deficits.   Lab Results  Component Value Date   CREATININE 0.98  04/02/2020   BUN 20 04/02/2020   NA 138 04/02/2020   K 4.0 04/02/2020   CL 103 04/02/2020   CO2 29 04/02/2020   Lab Results  Component Value Date   ALT 21 04/02/2020   AST 19 04/02/2020   ALKPHOS 105 04/02/2020   BILITOT 0.5 04/02/2020   Lab Results  Component Value Date   HGBA1C 8.2 (H) 03/14/2020   HGBA1C 6.2 (H) 08/10/2018   Lab Results  Component Value Date   INSULIN 28.0 (H) 03/14/2020   INSULIN 24.6 08/10/2018   Lab Results  Component Value Date   TSH 2.960 03/14/2020   Lab Results  Component Value Date   CHOL 166 03/14/2020   HDL 39 (L) 03/14/2020   LDLCALC 99 03/14/2020   TRIG 160 (H) 03/14/2020   Lab Results  Component Value Date   WBC 5.8 04/02/2020   HGB 12.3 (L) 04/02/2020   HCT 35.4 (L) 04/02/2020   MCV 90.1 04/02/2020   PLT 210 04/02/2020   No results found for: IRON, TIBC, FERRITIN  Attestation Statements:   Reviewed by clinician on day of visit: allergies, medications, problem list, medical history, surgical history, family history, social history, and previous encounter notes.   Wilhemena Durie, am acting as transcriptionist for [employer], PA-C.  I have reviewed the above documentation for accuracy and completeness, and I agree with the above. Abby Potash, PA-C

## 2020-04-18 NOTE — Telephone Encounter (Signed)
Last seen by Tracey Aguilar, PA-C. 

## 2020-04-23 ENCOUNTER — Encounter (INDEPENDENT_AMBULATORY_CARE_PROVIDER_SITE_OTHER): Payer: Self-pay | Admitting: Physician Assistant

## 2020-04-23 ENCOUNTER — Other Ambulatory Visit: Payer: Self-pay

## 2020-04-23 ENCOUNTER — Ambulatory Visit (INDEPENDENT_AMBULATORY_CARE_PROVIDER_SITE_OTHER): Payer: BC Managed Care – PPO | Admitting: Physician Assistant

## 2020-04-23 VITALS — BP 126/84 | HR 88 | Temp 97.7°F | Ht 71.0 in | Wt 272.0 lb

## 2020-04-23 DIAGNOSIS — E559 Vitamin D deficiency, unspecified: Secondary | ICD-10-CM

## 2020-04-23 DIAGNOSIS — Z6838 Body mass index (BMI) 38.0-38.9, adult: Secondary | ICD-10-CM | POA: Diagnosis not present

## 2020-04-23 DIAGNOSIS — Z9189 Other specified personal risk factors, not elsewhere classified: Secondary | ICD-10-CM | POA: Diagnosis not present

## 2020-04-23 DIAGNOSIS — E1169 Type 2 diabetes mellitus with other specified complication: Secondary | ICD-10-CM | POA: Diagnosis not present

## 2020-04-24 ENCOUNTER — Encounter: Payer: Self-pay | Admitting: Hematology & Oncology

## 2020-04-24 ENCOUNTER — Inpatient Hospital Stay (HOSPITAL_BASED_OUTPATIENT_CLINIC_OR_DEPARTMENT_OTHER): Payer: BC Managed Care – PPO | Admitting: Hematology & Oncology

## 2020-04-24 ENCOUNTER — Ambulatory Visit: Payer: BC Managed Care – PPO | Admitting: Hematology & Oncology

## 2020-04-24 ENCOUNTER — Ambulatory Visit: Payer: BC Managed Care – PPO

## 2020-04-24 ENCOUNTER — Other Ambulatory Visit: Payer: BC Managed Care – PPO

## 2020-04-24 ENCOUNTER — Inpatient Hospital Stay: Payer: BC Managed Care – PPO

## 2020-04-24 VITALS — BP 118/66 | HR 87 | Temp 98.7°F | Resp 18 | Wt 272.0 lb

## 2020-04-24 VITALS — BP 133/63 | HR 77 | Temp 98.6°F | Resp 17

## 2020-04-24 DIAGNOSIS — Z5111 Encounter for antineoplastic chemotherapy: Secondary | ICD-10-CM | POA: Diagnosis not present

## 2020-04-24 DIAGNOSIS — C8223 Follicular lymphoma grade III, unspecified, intra-abdominal lymph nodes: Secondary | ICD-10-CM

## 2020-04-24 DIAGNOSIS — R509 Fever, unspecified: Secondary | ICD-10-CM | POA: Diagnosis not present

## 2020-04-24 DIAGNOSIS — Z5112 Encounter for antineoplastic immunotherapy: Secondary | ICD-10-CM | POA: Diagnosis not present

## 2020-04-24 DIAGNOSIS — R0981 Nasal congestion: Secondary | ICD-10-CM | POA: Diagnosis not present

## 2020-04-24 DIAGNOSIS — Z79899 Other long term (current) drug therapy: Secondary | ICD-10-CM | POA: Diagnosis not present

## 2020-04-24 LAB — CBC WITH DIFFERENTIAL (CANCER CENTER ONLY)
Abs Immature Granulocytes: 0.04 10*3/uL (ref 0.00–0.07)
Basophils Absolute: 0 10*3/uL (ref 0.0–0.1)
Basophils Relative: 1 %
Eosinophils Absolute: 0 10*3/uL (ref 0.0–0.5)
Eosinophils Relative: 1 %
HCT: 36.2 % — ABNORMAL LOW (ref 39.0–52.0)
Hemoglobin: 12.7 g/dL — ABNORMAL LOW (ref 13.0–17.0)
Immature Granulocytes: 1 %
Lymphocytes Relative: 11 %
Lymphs Abs: 0.5 10*3/uL — ABNORMAL LOW (ref 0.7–4.0)
MCH: 31.3 pg (ref 26.0–34.0)
MCHC: 35.1 g/dL (ref 30.0–36.0)
MCV: 89.2 fL (ref 80.0–100.0)
Monocytes Absolute: 0.6 10*3/uL (ref 0.1–1.0)
Monocytes Relative: 13 %
Neutro Abs: 3.5 10*3/uL (ref 1.7–7.7)
Neutrophils Relative %: 73 %
Platelet Count: 262 10*3/uL (ref 150–400)
RBC: 4.06 MIL/uL — ABNORMAL LOW (ref 4.22–5.81)
RDW: 12.9 % (ref 11.5–15.5)
WBC Count: 4.8 10*3/uL (ref 4.0–10.5)
nRBC: 0 % (ref 0.0–0.2)

## 2020-04-24 LAB — CMP (CANCER CENTER ONLY)
ALT: 34 U/L (ref 0–44)
AST: 22 U/L (ref 15–41)
Albumin: 4.3 g/dL (ref 3.5–5.0)
Alkaline Phosphatase: 111 U/L (ref 38–126)
Anion gap: 6 (ref 5–15)
BUN: 20 mg/dL (ref 6–20)
CO2: 28 mmol/L (ref 22–32)
Calcium: 9.4 mg/dL (ref 8.9–10.3)
Chloride: 104 mmol/L (ref 98–111)
Creatinine: 1.04 mg/dL (ref 0.61–1.24)
GFR, Estimated: >60 mL/min (ref >60)
Glucose, Bld: 147 mg/dL — ABNORMAL HIGH (ref 70–99)
Potassium: 4.2 mmol/L (ref 3.5–5.1)
Sodium: 138 mmol/L (ref 135–145)
Total Bilirubin: 0.5 mg/dL (ref 0.3–1.2)
Total Protein: 6.8 g/dL (ref 6.5–8.1)

## 2020-04-24 LAB — LACTATE DEHYDROGENASE: LDH: 168 U/L (ref 98–192)

## 2020-04-24 MED ORDER — ACETAMINOPHEN 325 MG PO TABS
ORAL_TABLET | ORAL | Status: AC
  Filled 2020-04-24: qty 2

## 2020-04-24 MED ORDER — VINCRISTINE SULFATE CHEMO INJECTION 1 MG/ML
1.5000 mg | Freq: Once | INTRAVENOUS | Status: AC
  Administered 2020-04-24: 1.5 mg via INTRAVENOUS
  Filled 2020-04-24: qty 1.5

## 2020-04-24 MED ORDER — SODIUM CHLORIDE 0.9% FLUSH
10.0000 mL | Freq: Once | INTRAVENOUS | Status: AC
  Administered 2020-04-24: 10 mL via INTRAVENOUS
  Filled 2020-04-24: qty 10

## 2020-04-24 MED ORDER — SODIUM CHLORIDE 0.9 % IV SOLN
Freq: Once | INTRAVENOUS | Status: AC
  Filled 2020-04-24: qty 250

## 2020-04-24 MED ORDER — SODIUM CHLORIDE 0.9 % IV SOLN
750.0000 mg/m2 | Freq: Once | INTRAVENOUS | Status: AC
  Administered 2020-04-24: 1900 mg via INTRAVENOUS
  Filled 2020-04-24: qty 95

## 2020-04-24 MED ORDER — DOXORUBICIN HCL CHEMO IV INJECTION 2 MG/ML
50.0000 mg/m2 | Freq: Once | INTRAVENOUS | Status: AC
  Administered 2020-04-24: 126 mg via INTRAVENOUS
  Filled 2020-04-24: qty 63

## 2020-04-24 MED ORDER — PALONOSETRON HCL INJECTION 0.25 MG/5ML
0.2500 mg | Freq: Once | INTRAVENOUS | Status: AC
  Administered 2020-04-24: 0.25 mg via INTRAVENOUS

## 2020-04-24 MED ORDER — SODIUM CHLORIDE 0.9 % IV SOLN
10.0000 mg | Freq: Once | INTRAVENOUS | Status: AC
  Administered 2020-04-24: 10 mg via INTRAVENOUS
  Filled 2020-04-24: qty 10

## 2020-04-24 MED ORDER — SODIUM CHLORIDE 0.9 % IV SOLN
150.0000 mg | Freq: Once | INTRAVENOUS | Status: AC
  Administered 2020-04-24: 150 mg via INTRAVENOUS
  Filled 2020-04-24: qty 150

## 2020-04-24 MED ORDER — DIPHENHYDRAMINE HCL 50 MG/ML IJ SOLN
12.5000 mg | Freq: Once | INTRAMUSCULAR | Status: AC
  Administered 2020-04-24: 12.5 mg via INTRAVENOUS

## 2020-04-24 MED ORDER — ACETAMINOPHEN 325 MG PO TABS
650.0000 mg | ORAL_TABLET | Freq: Once | ORAL | Status: AC
  Administered 2020-04-24: 650 mg via ORAL

## 2020-04-24 MED ORDER — DIPHENHYDRAMINE HCL 50 MG/ML IJ SOLN
INTRAMUSCULAR | Status: AC
  Filled 2020-04-24: qty 1

## 2020-04-24 MED ORDER — SODIUM CHLORIDE 0.9 % IV SOLN
1000.0000 mg | Freq: Once | INTRAVENOUS | Status: AC
  Administered 2020-04-24: 1000 mg via INTRAVENOUS
  Filled 2020-04-24: qty 40

## 2020-04-24 MED ORDER — PALONOSETRON HCL INJECTION 0.25 MG/5ML
INTRAVENOUS | Status: AC
  Filled 2020-04-24: qty 5

## 2020-04-24 MED ORDER — SODIUM CHLORIDE 0.9 % IV SOLN
40.0000 mg | Freq: Once | INTRAVENOUS | Status: AC
  Administered 2020-04-24: 40 mg via INTRAVENOUS
  Filled 2020-04-24: qty 4

## 2020-04-24 MED ORDER — SODIUM CHLORIDE 0.9% FLUSH
10.0000 mL | INTRAVENOUS | Status: DC | PRN
  Administered 2020-04-24: 10 mL
  Filled 2020-04-24: qty 10

## 2020-04-24 MED ORDER — HEPARIN SOD (PORK) LOCK FLUSH 100 UNIT/ML IV SOLN
500.0000 [IU] | Freq: Once | INTRAVENOUS | Status: AC | PRN
  Administered 2020-04-24: 500 [IU]
  Filled 2020-04-24: qty 5

## 2020-04-24 NOTE — Addendum Note (Signed)
Addended by: Burney Gauze R on: 04/24/2020 09:20 AM   Modules accepted: Orders

## 2020-04-24 NOTE — Patient Instructions (Signed)
[facility] Discharge Instructions for Patients Receiving Chemotherapy  Today you received the following chemotherapy agents Cytoxan, Vincristine, Adriamycin, Gazyva  To help prevent nausea and vomiting after your treatment, we encourage you to take your nausea medication    If you develop nausea and vomiting that is not controlled by your nausea medication, call the clinic.   BELOW ARE SYMPTOMS THAT SHOULD BE REPORTED IMMEDIATELY:  *FEVER GREATER THAN 100.5 F  *CHILLS WITH OR WITHOUT FEVER  NAUSEA AND VOMITING THAT IS NOT CONTROLLED WITH YOUR NAUSEA MEDICATION  *UNUSUAL SHORTNESS OF BREATH  *UNUSUAL BRUISING OR BLEEDING  TENDERNESS IN MOUTH AND THROAT WITH OR WITHOUT PRESENCE OF ULCERS  *URINARY PROBLEMS  *BOWEL PROBLEMS  UNUSUAL RASH Items with * indicate a potential emergency and should be followed up as soon as possible.  Feel free to call the clinic should you have any questions or concerns. The clinic phone number is [phone].  Please show the [facility] at check-in to the Emergency Department and triage nurse.

## 2020-04-24 NOTE — Progress Notes (Signed)
Hematology and Oncology Follow Up Visit  James Jennings [id number] December 01, 1970 50 y.o. 04/24/2020   Principle Diagnosis:  Follicular B- cell non-Hodgkin's lymphoma - Relapsed  Past Therapy:             Rituxan/bendamustine-s/p cycle 6 - completed on 07/03/2016  Current Therapy: CHOP-Gazyva -- start cycle #1 on 04/03/2020 Gazyva  - Cytoxan/ Vincristine/Prednisone  - started 11/07/2019, s/p cycle #4 --  D/c on 03/26/2020 G-CHOP -- s/p cycle #1 -- started on 04/03/2020    Interim History:  James Jennings is here today for follow-up.  We now have him on more aggressive chemotherapy.  He has tolerated the CHOP protocol pretty well.  He has lost some of his hair.  He really has had no nausea or vomiting.  There is been no bleeding.  He has had no diarrhea.  There has been no issues with pain.  He has had no rashes.  He has had no mouth sores.  I would say that overall, his performance status is by ECOG 0.    Medications:  Allergies as of 04/24/2020      Reactions   Mango Flavor Swelling, Other (See Comments)   LIPS SWELL   Shellfish Allergy Anaphylaxis   Lactose Intolerance (gi) Diarrhea   Rituximab Other (See Comments)      Medication List       Accurate as of April 24, 2020  8:33 AM. If you have any questions, ask your nurse or doctor.        acetaminophen 325 MG tablet Commonly known as: TYLENOL Take 650 mg by mouth every 6 (six) hours as needed for moderate pain or headache.   atorvastatin 80 MG tablet Commonly known as: LIPITOR Take 1 tablet (80 mg total) by mouth daily.   cefdinir 300 MG capsule Commonly known as: OMNICEF Take 2 capsules (600 mg total) by mouth daily.   cetirizine 10 MG tablet Commonly known as: ZYRTEC Take 10 mg by mouth daily as needed for allergies.   ibuprofen 200 MG tablet Commonly known as: ADVIL Take 600 mg by mouth every 8 (eight) hours as needed for mild pain or moderate pain.   lidocaine-prilocaine cream Commonly known as: EMLA Apply 1  application topically as needed. Apply to [facility] 1 hour prior to procedure.   LORazepam 0.5 MG tablet Commonly known as: ATIVAN Take 1 tablet (0.5 mg total) by mouth every 6 (six) hours as needed for anxiety.   LORazepam 0.5 MG tablet Commonly known as: Ativan Take 1 tablet (0.5 mg total) by mouth every 6 (six) hours as needed (Nausea or vomiting).   metFORMIN 500 MG tablet Commonly known as: GLUCOPHAGE Take 1 tablet (500 mg total) by mouth daily.   montelukast 10 MG tablet Commonly known as: SINGULAIR TAKE 1 TABLET BY MOUTH AT BEDTIME. START 3 DAYS BEFORE CHEMO AND TAKE FOR 30 DAYS   multivitamin Tabs tablet Take 1 tablet by mouth daily.   ondansetron 8 MG tablet Commonly known as: ZOFRAN Take 1 tablet (8 mg total) by mouth every 8 (eight) hours as needed for nausea or vomiting.   ondansetron 8 MG tablet Commonly known as: Zofran Take 1 tablet (8 mg total) by mouth 2 (two) times daily as needed for refractory nausea / vomiting. Start on day 3 after cyclophosphamide chemotherapy.   Ozempic (0.25 or 0.5 MG/DOSE) 2 MG/1.5ML Sopn Generic drug: Semaglutide(0.25 or 0.5MG /DOS) Inject 0.25 mg into the skin once a week.   predniSONE 20 MG tablet Commonly known as:  DELTASONE Take 3 tablets (60 mg total) by mouth daily. Take on days 1-5 of chemotherapy.   prochlorperazine 10 MG tablet Commonly known as: COMPAZINE Take 1 tablet (10 mg total) by mouth every 6 (six) hours as needed for nausea or vomiting.   prochlorperazine 10 MG tablet Commonly known as: COMPAZINE Take 1 tablet (10 mg total) by mouth every 6 (six) hours as needed (Nausea or vomiting).       Allergies:  Allergies  Allergen Reactions  . Mango Flavor Swelling and Other (See Comments)    LIPS SWELL  . Shellfish Allergy Anaphylaxis  . Lactose Intolerance (Gi) Diarrhea  . Rituximab Other (See Comments)    Past Medical History, Surgical history, Social history, and Family History were reviewed and  updated.  Review of Systems: Review of Systems  Constitutional: Negative.   HENT: Negative.   Eyes: Negative.   Respiratory: Negative.   Cardiovascular: Negative.   Gastrointestinal: Negative.   Genitourinary: Negative.   Musculoskeletal: Negative.   Skin: Negative.   Neurological: Negative.   Endo/Heme/Allergies: Negative.   Psychiatric/Behavioral: Negative.      Physical Exam:  vitals were not taken for this visit.   Wt Readings from Last 3 Encounters:  04/23/20 272 lb (123.4 kg)  04/11/20 274 lb (124.3 kg)  03/28/20 278 lb (126.1 kg)    Physical Exam Vitals reviewed.  HENT:     Head: Normocephalic and atraumatic.  Eyes:     Pupils: Pupils are equal, round, and reactive to light.  Cardiovascular:     Rate and Rhythm: Normal rate and regular rhythm.     Heart sounds: Normal heart sounds.  Pulmonary:     Effort: Pulmonary effort is normal.     Breath sounds: Normal breath sounds.  Abdominal:     General: Bowel sounds are normal.     Palpations: Abdomen is soft.  Musculoskeletal:        General: No tenderness or deformity. Normal range of motion.     Cervical back: Normal range of motion.  Lymphadenopathy:     Cervical: No cervical adenopathy.  Skin:    General: Skin is warm and dry.     Findings: No erythema or rash.  Neurological:     Mental Status: He is alert and oriented to person, place, and time.  Psychiatric:        Behavior: Behavior normal.        Thought Content: Thought content normal.        Judgment: Judgment normal.      Lab Results  Component Value Date   WBC 4.8 04/24/2020   HGB 12.7 (L) 04/24/2020   HCT 36.2 (L) 04/24/2020   MCV 89.2 04/24/2020   PLT 262 04/24/2020   No results found for: FERRITIN, IRON, TIBC, UIBC, IRONPCTSAT Lab Results  Component Value Date   RETICCTPCT 1.07 12/06/2015   RBC 4.06 (L) 04/24/2020   RETICCTABS 48.79 12/06/2015   No results found for: [facility] Lab Results   Component Value Date   IGGSERUM 785 06/04/2016   IGMSERUM 52 06/04/2016   Lab Results  Component Value Date   TOTALPROTELP 6.5 10/31/2019   ALBUMINELP 3.7 10/31/2019   A1GS 0.2 10/31/2019   A2GS 0.7 10/31/2019   BETS 1.1 10/31/2019   GAMS 0.8 10/31/2019   MSPIKE Not Observed 10/31/2019     Chemistry      Component Value Date/Time   NA 138 04/02/2020 0830   NA 138 03/14/2020 0839   NA  141 12/03/2016 1155   NA 141 02/07/2016 1149   K 4.0 04/02/2020 0830   K 3.8 12/03/2016 1155   K 4.3 02/07/2016 1149   CL 103 04/02/2020 0830   CL 100 12/03/2016 1155   CO2 29 04/02/2020 0830   CO2 27 12/03/2016 1155   CO2 27 02/07/2016 1149   BUN 20 04/02/2020 0830   BUN 15 03/14/2020 0839   BUN 10 12/03/2016 1155   BUN 10.9 02/07/2016 1149   CREATININE 0.98 04/02/2020 0830   CREATININE 1.0 12/03/2016 1155   CREATININE 0.9 02/07/2016 1149      Component Value Date/Time   CALCIUM 9.4 04/02/2020 0830   CALCIUM 9.1 12/03/2016 1155   CALCIUM 9.6 02/07/2016 1149   ALKPHOS 105 04/02/2020 0830   ALKPHOS 99 (H) 12/03/2016 1155   ALKPHOS 127 02/07/2016 1149   AST 19 04/02/2020 0830   AST 17 02/07/2016 1149   ALT 21 04/02/2020 0830   ALT 24 12/03/2016 1155   ALT 16 02/07/2016 1149   BILITOT 0.5 04/02/2020 0830   BILITOT 0.42 02/07/2016 1149       Impression and Plan: James Jennings a pleasant 49 yo caucasian gentleman with history of follicular large cell non-Hodgkin's lymphoma. He completed 6 cycles of chemotherapy with bendamustine on 07/03/2016 but was not tolerant of Rituxan (anaphylaxis). His lymphoma has now recurred.   We will go ahead with his second cycle of chemotherapy.  After this cycle of chemotherapy, we will do a PET scan and see how things look.  Hopefully, we will see that the activity is all gone.  I will plan to see him back in 3 weeks.  We will try to get the PET scan right before we see him back.   [name], MD 3/23/20228:33 AM

## 2020-04-24 NOTE — Patient Instructions (Signed)
Tunneled Central Venous Catheter Flushing Guide  It is important to flush your tunneled central venous catheter each time you use it, both before and after you use it. Flushing your catheter will help prevent it from clogging. What are the risks? Risks may include:  Infection.  Air getting into the catheter and bloodstream. Supplies needed:  A clean pair of gloves.  A disinfecting wipe. Use an alcohol wipe, chlorhexidine wipe, or iodine wipe as told by your health care provider.  A 10 mL syringe that has been prefilled with saline solution.  An empty 10 mL syringe, if a substance called heparin was injected into your catheter. How to flush your catheter When you flush your catheter, make sure you follow any specific instructions from your health care provider or the manufacturer. These are general guidelines. Flushing your catheter before use If there is heparin in your catheter: 1. Wash your hands with soap and water. 2. Put on gloves. 3. Scrub the injection cap for a minimum of 15 seconds with a disinfecting wipe. 4. Unclamp the catheter. 5. Attach the empty syringe to the injection cap. 6. Pull the syringe plunger back and withdraw 10 mL of blood. 7. Place the syringe into an appropriate waste container. 8. Scrub the injection cap for 15 seconds with a disinfecting wipe. 9. Attach the prefilled syringe to the injection cap. 10. Flush the catheter by pushing the plunger forward until all the liquid from the syringe is in the catheter. 11. Remove the syringe from the injection cap. 12. Clamp the catheter. If there is no heparin in your catheter: 1. Wash your hands with soap and water. 2. Put on gloves. 3. Scrub the injection cap for 15 seconds with a disinfecting wipe. 4. Unclamp the catheter. 5. Attach the prefilled syringe to the injection cap. 6. Flush the catheter by pushing the plunger forward until 5 mL of the liquid from the syringe is in the catheter. 7. Pull back on  the syringe until you see blood in the catheter. 8. If you have been asked to collect any blood, follow your health care provider's instructions. Otherwise, flush the catheter with the rest of the solution from the syringe. 9. Remove the syringe from the injection cap. 10. Clamp the catheter.   Flushing your catheter after use 1. Wash your hands with soap and water. 2. Put on gloves. 3. Scrub the injection cap for 15 seconds with a disinfecting wipe. 4. Unclamp the catheter. 5. Attach the prefilled syringe to the injection cap. 6. Flush the catheter by pushing the plunger forward until all of the liquid from the syringe is in the catheter. 7. Remove the syringe from the injection cap. 8. Clamp the catheter. Problems and solutions  If blood cannot be completely cleared from the injection cap, you may need to have the injection cap replaced.  If the catheter is difficult to flush, use the pulsing method. The pulsing method involves pushing only a few milliliters of solution into the catheter at a time and pausing between pushes.  If you do not see blood in the catheter when you pull back on the syringe, change your body position, such as by raising your arms above your head. Take a deep breath and cough. Then, pull back on the syringe. If you still do not see blood, flush the catheter with a small amount of solution. Then, change positions again and take a breath or cough. Pull back on the syringe again. If you still do not   see blood, finish flushing the catheter and contact your health care provider. Do not use your catheter until your health care provider says it is okay. General tips  Have someone help you flush your catheter, if possible.  Do not force fluid through your catheter.  Do not use a syringe that is larger or smaller than 10 mL. Using a smaller syringe can make the catheter burst.  Do not use your catheter without flushing it first if it has heparin in it. Contact a health  care provider if:  You cannot see any blood in the catheter when you flush it before using it.  Your catheter is difficult to flush. Get help right away if:  You cannot flush the catheter.  The catheter leaks when you flush it or when there is fluid in it.  There are cracks or breaks in the catheter. Summary  It is important to flush your tunneled central venous catheter each time you use it, both before and after you use it.  Scrub the injection cap for 15 seconds with a disinfecting wipe before and after you flush it.  When you flush your catheter, make sure you follow any specific instructions from your health care provider or the manufacturer.  Get help right away if you cannot flush the catheter. This information is not intended to replace advice given to you by your health care provider. Make sure you discuss any questions you have with your health care provider. Document Revised: 03/30/2019 Document Reviewed: 04/06/2018 Elsevier Patient Education  2021 Elsevier Inc.  

## 2020-04-25 NOTE — Progress Notes (Signed)
Chief Complaint:   OBESITY James Jennings is here to discuss his progress with his obesity treatment plan along with follow-up of his obesity related diagnoses. James Jennings is on the Category 4 Plan and states he is following his eating plan approximately 83% of the time. James Jennings states he is walking for 45 minutes 7 times per week.  Today's visit was #: 4 Starting weight: 277 lbs Starting date: 03/14/2020 Today's weight: 272 lbs Today's date: 04/23/2020 Total lbs lost to date: 5 Total lbs lost since last in-office visit: 2  Interim History: James Jennings did very well with weight loss. He can eat the same thing over and over and be okay. Dinner is the most challenging. He has eaten out 5 times since his last visit.  Subjective:   1. Type 2 diabetes mellitus with other specified complication, without long-term current use of insulin ([name]) James Jennings is on Ozempic 0.25 mg, and he is also on metformin. Last A1c was 8.2. Labs were reviewed today with the patient. His appetite is controlled. He is getting labs done with Oncology tomorrow.  2. Vitamin D deficiency James Jennings is not on medications, and he notes his energy is up and down due to chemo treatments. Last Vit D level was not at goal.  3. At risk for heart disease James Jennings is at a higher than average risk for cardiovascular disease due to obesity.   Assessment/Plan:   1. Type 2 diabetes mellitus with other specified complication, without long-term current use of insulin ([name]) Good blood sugar control is important to decrease the likelihood of diabetic complications such as nephropathy, neuropathy, limb loss, blindness, coronary artery disease, and death. Intensive lifestyle modification including diet, exercise and weight loss are the first line of treatment for diabetes. James Jennings is to increase his exercise and continue his medications.  2. Vitamin D deficiency Low Vitamin D level contributes to fatigue and are associated with obesity, breast, and colon cancer. James Jennings will  consider starting Vitamin D, and will follow-up for routine testing of Vitamin D, at least 2-3 times per year to avoid over-replacement.  3. At risk for heart disease James Jennings was given approximately 15 minutes of coronary artery disease prevention counseling today. He is 50 y.o. male and has risk factors for heart disease including obesity. We discussed intensive lifestyle modifications today with an emphasis on specific weight loss instructions and strategies.   Repetitive spaced learning was employed today to elicit superior memory formation and behavioral change.  4. Class 2 severe obesity with serious comorbidity and body mass index (BMI) of 38.0 to 38.9 in adult, unspecified obesity type [facility]) James Jennings is currently in the action stage of change. As such, his goal is to continue with weight loss efforts. He has agreed to the Category 4 Plan.   Recipes were given today.  Exercise goals: As is.  Behavioral modification strategies: meal planning and cooking strategies and keeping healthy foods in the home.  James Jennings has agreed to follow-up with our clinic in 2 weeks. He was informed of the importance of frequent follow-up visits to maximize his success with intensive lifestyle modifications for his multiple health conditions.   Objective:   Blood pressure 126/84, pulse 88, temperature 97.7 F (36.5 C), height 5\' 11"  (1.803 m), weight 272 lb (123.4 kg), SpO2 98 %. Body mass index is 37.94 kg/m.  General: Cooperative, alert, well developed, in no acute distress. HEENT: Conjunctivae and lids unremarkable. Cardiovascular: Regular rhythm.  Lungs: Normal work of breathing. Neurologic: No focal deficits.  Lab Results  Component Value Date   CREATININE 1.04 04/24/2020   BUN 20 04/24/2020   NA 138 04/24/2020   K 4.2 04/24/2020   CL 104 04/24/2020   CO2 28 04/24/2020   Lab Results  Component Value Date   ALT 34 04/24/2020   AST 22 04/24/2020   ALKPHOS 111 04/24/2020   BILITOT 0.5 04/24/2020    Lab Results  Component Value Date   HGBA1C 8.2 (H) 03/14/2020   HGBA1C 6.2 (H) 08/10/2018   Lab Results  Component Value Date   INSULIN 28.0 (H) 03/14/2020   INSULIN 24.6 08/10/2018   Lab Results  Component Value Date   TSH 2.960 03/14/2020   Lab Results  Component Value Date   CHOL 166 03/14/2020   HDL 39 (L) 03/14/2020   LDLCALC 99 03/14/2020   TRIG 160 (H) 03/14/2020   Lab Results  Component Value Date   WBC 4.8 04/24/2020   HGB 12.7 (L) 04/24/2020   HCT 36.2 (L) 04/24/2020   MCV 89.2 04/24/2020   PLT 262 04/24/2020   No results found for: IRON, TIBC, FERRITIN  Attestation Statements:   Reviewed by clinician on day of visit: allergies, medications, problem list, medical history, surgical history, family history, social history, and previous encounter notes.   Wilhemena Durie, am acting as transcriptionist for [employer], PA-C.  I have reviewed the above documentation for accuracy and completeness, and I agree with the above. Abby Potash, PA-C

## 2020-04-30 ENCOUNTER — Ambulatory Visit (HOSPITAL_BASED_OUTPATIENT_CLINIC_OR_DEPARTMENT_OTHER)
Admission: RE | Admit: 2020-04-30 | Discharge: 2020-04-30 | Disposition: A | Discharge status: Home / Self Care | Payer: BC Managed Care – PPO | Source: Ambulatory Visit | Attending: Hematology & Oncology | Admitting: Hematology & Oncology

## 2020-04-30 ENCOUNTER — Inpatient Hospital Stay: Payer: BC Managed Care – PPO

## 2020-04-30 ENCOUNTER — Encounter: Payer: Self-pay | Admitting: Hematology & Oncology

## 2020-04-30 ENCOUNTER — Inpatient Hospital Stay (HOSPITAL_BASED_OUTPATIENT_CLINIC_OR_DEPARTMENT_OTHER): Payer: BC Managed Care – PPO | Admitting: Hematology & Oncology

## 2020-04-30 ENCOUNTER — Telehealth: Payer: Self-pay | Admitting: *Deleted

## 2020-04-30 ENCOUNTER — Other Ambulatory Visit: Payer: Self-pay | Admitting: *Deleted

## 2020-04-30 ENCOUNTER — Other Ambulatory Visit: Payer: Self-pay

## 2020-04-30 VITALS — BP 111/62 | HR 101 | Temp 99.2°F | Resp 20 | Wt 276.0 lb

## 2020-04-30 DIAGNOSIS — R509 Fever, unspecified: Secondary | ICD-10-CM

## 2020-04-30 DIAGNOSIS — Z5111 Encounter for antineoplastic chemotherapy: Secondary | ICD-10-CM | POA: Diagnosis not present

## 2020-04-30 DIAGNOSIS — R0981 Nasal congestion: Secondary | ICD-10-CM | POA: Diagnosis not present

## 2020-04-30 DIAGNOSIS — Z5112 Encounter for antineoplastic immunotherapy: Secondary | ICD-10-CM | POA: Diagnosis not present

## 2020-04-30 DIAGNOSIS — C8223 Follicular lymphoma grade III, unspecified, intra-abdominal lymph nodes: Secondary | ICD-10-CM

## 2020-04-30 DIAGNOSIS — Z79899 Other long term (current) drug therapy: Secondary | ICD-10-CM | POA: Diagnosis not present

## 2020-04-30 LAB — CBC WITH DIFFERENTIAL (CANCER CENTER ONLY)
Abs Immature Granulocytes: 0.07 10*3/uL (ref 0.00–0.07)
Basophils Absolute: 0 10*3/uL (ref 0.0–0.1)
Basophils Relative: 0 %
Eosinophils Absolute: 0.1 10*3/uL (ref 0.0–0.5)
Eosinophils Relative: 1 %
HCT: 34.7 % — ABNORMAL LOW (ref 39.0–52.0)
Hemoglobin: 12.1 g/dL — ABNORMAL LOW (ref 13.0–17.0)
Immature Granulocytes: 1 %
Lymphocytes Relative: 5 %
Lymphs Abs: 0.3 10*3/uL — ABNORMAL LOW (ref 0.7–4.0)
MCH: 30.9 pg (ref 26.0–34.0)
MCHC: 34.9 g/dL (ref 30.0–36.0)
MCV: 88.5 fL (ref 80.0–100.0)
Monocytes Absolute: 0.1 10*3/uL (ref 0.1–1.0)
Monocytes Relative: 1 %
Neutro Abs: 5 10*3/uL (ref 1.7–7.7)
Neutrophils Relative %: 92 %
Platelet Count: 140 10*3/uL — ABNORMAL LOW (ref 150–400)
RBC: 3.92 MIL/uL — ABNORMAL LOW (ref 4.22–5.81)
RDW: 12.9 % (ref 11.5–15.5)
WBC Count: 5.5 10*3/uL (ref 4.0–10.5)
nRBC: 0 % (ref 0.0–0.2)

## 2020-04-30 MED ORDER — DEXTROSE 5 % IV SOLN
2.0000 g | Freq: Once | INTRAVENOUS | Status: AC
  Administered 2020-04-30: 2 g via INTRAVENOUS
  Filled 2020-04-30: qty 20

## 2020-04-30 MED ORDER — SODIUM CHLORIDE 0.9 % IV SOLN
Freq: Once | INTRAVENOUS | Status: AC
  Filled 2020-04-30: qty 250

## 2020-04-30 MED ORDER — CEFDINIR 300 MG PO CAPS: 600.0000 mg | ORAL_CAPSULE | Freq: Every day | ORAL | 0 refills | Status: AC

## 2020-04-30 MED ORDER — HEPARIN SOD (PORK) LOCK FLUSH 100 UNIT/ML IV SOLN
500.0000 [IU] | Freq: Once | INTRAVENOUS | Status: AC
  Administered 2020-04-30: 500 [IU] via INTRAVENOUS
  Filled 2020-04-30: qty 5

## 2020-04-30 MED ORDER — SODIUM CHLORIDE 0.9% FLUSH
10.0000 mL | Freq: Once | INTRAVENOUS | Status: AC
  Administered 2020-04-30: 10 mL via INTRAVENOUS
  Filled 2020-04-30: qty 10

## 2020-04-30 NOTE — Patient Instructions (Signed)
Ceftriaxone Injection What is this medicine? CEFTRIAXONE (sef try AX one) is a cephalosporin antibiotic. It treats some infections caused by bacteria. It will not work for colds, the flu, or other viruses. This medicine may be used for other purposes; ask your health care provider or pharmacist if you have questions. COMMON BRAND NAME(S): Ceftrisol Plus, Rocephin What should I tell my health care provider before I take this medicine? They need to know if you have any of these conditions:  any chronic illness  bowel disease, like colitis  both kidney and liver disease  high bilirubin level in newborn patients  an unusual or allergic reaction to ceftriaxone, other cephalosporin or penicillin antibiotics, foods, dyes, or preservatives  pregnant or trying to get pregnant  breast-feeding How should I use this medicine? This drug is injected into a muscle or a vein. It is usually given by a health care provider in a hospital or clinic setting. If you get this drug at home, you will be taught how to prepare and give it. Use exactly as directed. Take it as directed on the prescription label at the same time every day. Keep taking it unless your health care provider tells you to stop. It is important that you put your used needles and syringes in a special sharps container. Do not put them in a trash can. If you do not have a sharps container, call your pharmacist or health care provider to get one. Talk to your health care provider about the use of this drug in children. While it may be prescribed for children as young as newborns for selected conditions, precautions do apply. Overdosage: If you think you have taken too much of this medicine contact a poison control center or emergency room at once. NOTE: This medicine is only for you. Do not share this medicine with others. What if I miss a dose? It is important not to miss your dose. Call your health care provider if you are unable to keep an  appointment. If you give yourself this drug at home and you miss a dose, take it as soon as you can. If it is almost time for your next dose, take only that dose. Do not take double or extra doses. What may interact with this medicine? Do not take this medicine with any of the following medications:  intravenous calcium This medicine may also interact with the following medications:  birth control pills This list may not describe all possible interactions. Give your health care provider a list of all the medicines, herbs, non-prescription drugs, or dietary supplements you use. Also tell them if you smoke, drink alcohol, or use illegal drugs. Some items may interact with your medicine. What should I watch for while using this medicine? Tell your doctor or health care provider if your symptoms do not improve or if they get worse. This medicine may cause serious skin reactions. They can happen weeks to months after starting the medicine. Contact your health care provider right away if you notice fevers or flu-like symptoms with a rash. The rash may be red or purple and then turn into blisters or peeling of the skin. Or, you might notice a red rash with swelling of the face, lips or lymph nodes in your neck or under your arms. Do not treat diarrhea with over the counter products. Contact your doctor if you have diarrhea that lasts more than 2 days or if it is severe and watery. If you are being treated for   a sexually transmitted disease, avoid sexual contact until you have finished your treatment. Having sex can infect your sexual partner. Calcium may bind to this medicine and cause lung or kidney problems. Avoid calcium products while taking this medicine and for 48 hours after taking the last dose of this medicine. What side effects may I notice from receiving this medicine? Side effects that you should report to your doctor or health care professional as soon as possible:  allergic reactions like  skin rash, itching or hives, swelling of the face, lips, or tongue  breathing problems  fever, chills  irregular heartbeat  pain when passing urine  redness, blistering, peeling, or loosening of the skin, including inside the mouth  seizures  stomach pain, cramps  unusual bleeding, bruising  unusually weak or tired Side effects that usually do not require medical attention (report to your doctor or health care professional if they continue or are bothersome):  diarrhea  dizzy, drowsy  headache  nausea, vomiting  pain, swelling, irritation where injected  stomach upset  sweating This list may not describe all possible side effects. Call your doctor for medical advice about side effects. You may report side effects to FDA at 1-800-FDA-1088. Where should I keep my medicine? Keep out of the reach of children and pets. You will be instructed on how to store this drug. Protect from light. Throw away any unused drug after the expiration date. NOTE: This sheet is a summary. It may not cover all possible information. If you have questions about this medicine, talk to your doctor, pharmacist, or health care provider.  2021 Elsevier/Gold Standard (2018-08-25 18:29:21)

## 2020-04-30 NOTE — Telephone Encounter (Signed)
Message received from patient stating that he had a fever during the night along with congestion, drainage and cough.  Call placed back to patient and patient notified per order of Dr. Marin Olp to come in now for labs, CXR and to see Dr. Marin Olp.  Pt states that he can be here by 10:00AM.  Message sent to scheduling.

## 2020-04-30 NOTE — Progress Notes (Signed)
I am seeing James Jennings today.  He called in this morning saying that he had a temperature.  He is quite congested.  He had chemotherapy I think last Thursday.  He is on R-CHOP.  I wanted to make sure that his blood counts were not low.  Thankfully, his white cell count was okay at 5.5.  Hemoglobin 12.1.  Platelet count 140,000.  He does sound quite congested.  Says he coughing up some purulent type of mucus.  We did get a chest x-ray on him.  This thankfully did not show any obvious infiltrate.  Although the report is not back yet, I did not see any obvious infiltrate.  He has had no rashes.  There has been no mouth sores.  He has had no diarrhea.  There is been no chest wall pain.  On his exam, he has a temperature of 99.2.  Blood pressure 111/62.  Pulse is 101.  Oxygen saturation is 98%.  His lungs sound clear bilaterally.  Oral exam does not show any mucositis.  Cardiac exam slightly tachycardic but regular.  Abdomen obese but soft.  I do think that we have to treat him.  He is at risk even though he is not neutropenic.  I will go ahead and give a dose of Rocephin in the office.  He will get a dose of 2 g.  I will then place him on some oral antibiotics.  I like using Omnicef.  The dose will be 600 mg daily.  I told him to take some over-the-counter decongestant.  I think this would be reasonable for him.  Again, he is not neutropenic.  He certainly could become neutropenic.  Again this has not been a problem to date.  Lattie Haw, MD

## 2020-04-30 NOTE — Patient Instructions (Signed)

## 2020-05-06 ENCOUNTER — Other Ambulatory Visit (INDEPENDENT_AMBULATORY_CARE_PROVIDER_SITE_OTHER): Payer: Self-pay | Admitting: Family Medicine

## 2020-05-06 DIAGNOSIS — E782 Mixed hyperlipidemia: Secondary | ICD-10-CM

## 2020-05-06 NOTE — Telephone Encounter (Signed)
Last seen by Abby Potash, PA-C.

## 2020-05-09 ENCOUNTER — Encounter (INDEPENDENT_AMBULATORY_CARE_PROVIDER_SITE_OTHER): Payer: Self-pay

## 2020-05-09 NOTE — Telephone Encounter (Signed)
Okay to refill? Pt states he did need it. Forgot to tell us last visit.

## 2020-05-12 ENCOUNTER — Other Ambulatory Visit: Payer: Self-pay | Admitting: Hematology & Oncology

## 2020-05-13 ENCOUNTER — Ambulatory Visit (HOSPITAL_COMMUNITY)
Admission: RE | Admit: 2020-05-13 | Discharge: 2020-05-13 | Disposition: A | Discharge status: Home / Self Care | Payer: BC Managed Care – PPO | Source: Ambulatory Visit | Attending: Hematology & Oncology | Admitting: Hematology & Oncology

## 2020-05-13 ENCOUNTER — Other Ambulatory Visit: Payer: Self-pay

## 2020-05-13 DIAGNOSIS — C8223 Follicular lymphoma grade III, unspecified, intra-abdominal lymph nodes: Secondary | ICD-10-CM | POA: Insufficient documentation

## 2020-05-13 DIAGNOSIS — C8290 Follicular lymphoma, unspecified, unspecified site: Secondary | ICD-10-CM | POA: Diagnosis not present

## 2020-05-13 LAB — GLUCOSE, CAPILLARY: Glucose-Capillary: 155 mg/dL — ABNORMAL HIGH (ref 70–99)

## 2020-05-13 MED ORDER — FLUDEOXYGLUCOSE F - 18 (FDG) INJECTION: 13.8000 | Freq: Once | INTRAVENOUS | Status: DC | PRN

## 2020-05-14 ENCOUNTER — Inpatient Hospital Stay: Payer: BC Managed Care – PPO | Attending: Hematology & Oncology

## 2020-05-14 ENCOUNTER — Encounter (INDEPENDENT_AMBULATORY_CARE_PROVIDER_SITE_OTHER): Payer: Self-pay

## 2020-05-14 ENCOUNTER — Encounter: Payer: Self-pay | Admitting: Hematology & Oncology

## 2020-05-14 ENCOUNTER — Inpatient Hospital Stay: Payer: BC Managed Care – PPO

## 2020-05-14 ENCOUNTER — Inpatient Hospital Stay (HOSPITAL_BASED_OUTPATIENT_CLINIC_OR_DEPARTMENT_OTHER): Payer: BC Managed Care – PPO | Admitting: Hematology & Oncology

## 2020-05-14 VITALS — BP 102/59 | HR 100 | Temp 98.4°F | Resp 18

## 2020-05-14 VITALS — BP 128/72 | HR 100 | Temp 97.2°F | Resp 18 | Wt 273.1 lb

## 2020-05-14 DIAGNOSIS — Z5112 Encounter for antineoplastic immunotherapy: Secondary | ICD-10-CM | POA: Diagnosis not present

## 2020-05-14 DIAGNOSIS — Z7984 Long term (current) use of oral hypoglycemic drugs: Secondary | ICD-10-CM | POA: Diagnosis not present

## 2020-05-14 DIAGNOSIS — Z79899 Other long term (current) drug therapy: Secondary | ICD-10-CM | POA: Diagnosis not present

## 2020-05-14 DIAGNOSIS — C8223 Follicular lymphoma grade III, unspecified, intra-abdominal lymph nodes: Secondary | ICD-10-CM | POA: Insufficient documentation

## 2020-05-14 DIAGNOSIS — Z5111 Encounter for antineoplastic chemotherapy: Secondary | ICD-10-CM | POA: Diagnosis not present

## 2020-05-14 LAB — CBC WITH DIFFERENTIAL (CANCER CENTER ONLY)
Abs Immature Granulocytes: 0.03 10*3/uL (ref 0.00–0.07)
Basophils Absolute: 0 10*3/uL (ref 0.0–0.1)
Basophils Relative: 0 %
Eosinophils Absolute: 0.1 10*3/uL (ref 0.0–0.5)
Eosinophils Relative: 1 %
HCT: 34.5 % — ABNORMAL LOW (ref 39.0–52.0)
Hemoglobin: 11.9 g/dL — ABNORMAL LOW (ref 13.0–17.0)
Immature Granulocytes: 1 %
Lymphocytes Relative: 7 %
Lymphs Abs: 0.4 10*3/uL — ABNORMAL LOW (ref 0.7–4.0)
MCH: 30.8 pg (ref 26.0–34.0)
MCHC: 34.5 g/dL (ref 30.0–36.0)
MCV: 89.4 fL (ref 80.0–100.0)
Monocytes Absolute: 0.6 10*3/uL (ref 0.1–1.0)
Monocytes Relative: 12 %
Neutro Abs: 4 10*3/uL (ref 1.7–7.7)
Neutrophils Relative %: 79 %
Platelet Count: 356 10*3/uL (ref 150–400)
RBC: 3.86 MIL/uL — ABNORMAL LOW (ref 4.22–5.81)
RDW: 13.7 % (ref 11.5–15.5)
WBC Count: 5.1 10*3/uL (ref 4.0–10.5)
nRBC: 0 % (ref 0.0–0.2)

## 2020-05-14 LAB — CMP (CANCER CENTER ONLY)
ALT: 31 U/L (ref 0–44)
AST: 19 U/L (ref 15–41)
Albumin: 4.1 g/dL (ref 3.5–5.0)
Alkaline Phosphatase: 101 U/L (ref 38–126)
Anion gap: 8 (ref 5–15)
BUN: 20 mg/dL (ref 6–20)
CO2: 27 mmol/L (ref 22–32)
Calcium: 9.6 mg/dL (ref 8.9–10.3)
Chloride: 103 mmol/L (ref 98–111)
Creatinine: 0.98 mg/dL (ref 0.61–1.24)
GFR, Estimated: >60 mL/min (ref >60)
Glucose, Bld: 154 mg/dL — ABNORMAL HIGH (ref 70–99)
Potassium: 4.4 mmol/L (ref 3.5–5.1)
Sodium: 138 mmol/L (ref 135–145)
Total Bilirubin: 0.4 mg/dL (ref 0.3–1.2)
Total Protein: 6.6 g/dL (ref 6.5–8.1)

## 2020-05-14 LAB — LACTATE DEHYDROGENASE: LDH: 176 U/L (ref 98–192)

## 2020-05-14 MED ORDER — PALONOSETRON HCL INJECTION 0.25 MG/5ML
INTRAVENOUS | Status: AC
  Filled 2020-05-14: qty 5

## 2020-05-14 MED ORDER — EPOETIN ALFA-EPBX 10000 UNIT/ML IJ SOLN
INTRAMUSCULAR | Status: AC
  Filled 2020-05-14: qty 2

## 2020-05-14 MED ORDER — DOXORUBICIN HCL CHEMO IV INJECTION 2 MG/ML
50.0000 mg/m2 | Freq: Once | INTRAVENOUS | Status: AC
  Administered 2020-05-14: 126 mg via INTRAVENOUS
  Filled 2020-05-14: qty 63

## 2020-05-14 MED ORDER — SODIUM CHLORIDE 0.9 % IV SOLN
10.0000 mg | Freq: Once | INTRAVENOUS | Status: AC
  Administered 2020-05-14: 10 mg via INTRAVENOUS
  Filled 2020-05-14: qty 10

## 2020-05-14 MED ORDER — CYANOCOBALAMIN 1000 MCG/ML IJ SOLN
INTRAMUSCULAR | Status: AC
  Filled 2020-05-14: qty 1

## 2020-05-14 MED ORDER — HEPARIN SOD (PORK) LOCK FLUSH 100 UNIT/ML IV SOLN
500.0000 [IU] | Freq: Once | INTRAVENOUS | Status: AC | PRN
  Administered 2020-05-14: 500 [IU]
  Filled 2020-05-14: qty 5

## 2020-05-14 MED ORDER — SODIUM CHLORIDE 0.9 % IV SOLN
Freq: Once | INTRAVENOUS | Status: AC
  Filled 2020-05-14: qty 250

## 2020-05-14 MED ORDER — SODIUM CHLORIDE 0.9 % IV SOLN
750.0000 mg/m2 | Freq: Once | INTRAVENOUS | Status: AC
  Administered 2020-05-14: 1900 mg via INTRAVENOUS
  Filled 2020-05-14: qty 95

## 2020-05-14 MED ORDER — SODIUM CHLORIDE 0.9 % IV SOLN
40.0000 mg | Freq: Once | INTRAVENOUS | Status: AC
  Administered 2020-05-14: 40 mg via INTRAVENOUS
  Filled 2020-05-14: qty 4

## 2020-05-14 MED ORDER — DIPHENHYDRAMINE HCL 50 MG/ML IJ SOLN
INTRAMUSCULAR | Status: AC
  Filled 2020-05-14: qty 1

## 2020-05-14 MED ORDER — SODIUM CHLORIDE 0.9% FLUSH
10.0000 mL | INTRAVENOUS | Status: DC | PRN
  Administered 2020-05-14: 10 mL
  Filled 2020-05-14: qty 10

## 2020-05-14 MED ORDER — VINCRISTINE SULFATE CHEMO INJECTION 1 MG/ML
1.5000 mg | Freq: Once | INTRAVENOUS | Status: AC
  Administered 2020-05-14: 1.5 mg via INTRAVENOUS
  Filled 2020-05-14: qty 1.5

## 2020-05-14 MED ORDER — SODIUM CHLORIDE 0.9 % IV SOLN
1000.0000 mg | Freq: Once | INTRAVENOUS | Status: AC
  Administered 2020-05-14: 1000 mg via INTRAVENOUS
  Filled 2020-05-14: qty 40

## 2020-05-14 MED ORDER — ACETAMINOPHEN 325 MG PO TABS
650.0000 mg | ORAL_TABLET | Freq: Once | ORAL | Status: AC
  Administered 2020-05-14: 650 mg via ORAL

## 2020-05-14 MED ORDER — EPOETIN ALFA-EPBX 40000 UNIT/ML IJ SOLN
INTRAMUSCULAR | Status: AC
  Filled 2020-05-14: qty 1

## 2020-05-14 MED ORDER — DIPHENHYDRAMINE HCL 50 MG/ML IJ SOLN
12.5000 mg | Freq: Once | INTRAMUSCULAR | Status: AC
  Administered 2020-05-14: 12.5 mg via INTRAVENOUS

## 2020-05-14 MED ORDER — DARBEPOETIN ALFA 300 MCG/0.6ML IJ SOSY
PREFILLED_SYRINGE | INTRAMUSCULAR | Status: AC
  Filled 2020-05-14: qty 1.2

## 2020-05-14 MED ORDER — SODIUM CHLORIDE 0.9 % IV SOLN
150.0000 mg | Freq: Once | INTRAVENOUS | Status: AC
  Administered 2020-05-14: 150 mg via INTRAVENOUS
  Filled 2020-05-14: qty 50

## 2020-05-14 MED ORDER — PALONOSETRON HCL INJECTION 0.25 MG/5ML
0.2500 mg | Freq: Once | INTRAVENOUS | Status: AC
  Administered 2020-05-14: 0.25 mg via INTRAVENOUS

## 2020-05-14 MED ORDER — ACETAMINOPHEN 325 MG PO TABS
ORAL_TABLET | ORAL | Status: AC
  Filled 2020-05-14: qty 2

## 2020-05-14 NOTE — Patient Instructions (Signed)
Ceftriaxone Injection What is this medicine? CEFTRIAXONE (sef try AX one) is a cephalosporin antibiotic. It treats some infections caused by bacteria. It will not work for colds, the flu, or other viruses. This medicine may be used for other purposes; ask your health care provider or pharmacist if you have questions. COMMON BRAND NAME(S): Ceftrisol Plus, Rocephin What should I tell my health care provider before I take this medicine? They need to know if you have any of these conditions:  any chronic illness  bowel disease, like colitis  both kidney and liver disease  high bilirubin level in newborn patients  an unusual or allergic reaction to ceftriaxone, other cephalosporin or penicillin antibiotics, foods, dyes, or preservatives  pregnant or trying to get pregnant  breast-feeding How should I use this medicine? This drug is injected into a muscle or a vein. It is usually given by a health care provider in a hospital or clinic setting. If you get this drug at home, you will be taught how to prepare and give it. Use exactly as directed. Take it as directed on the prescription label at the same time every day. Keep taking it unless your health care provider tells you to stop. It is important that you put your used needles and syringes in a special sharps container. Do not put them in a trash can. If you do not have a sharps container, call your pharmacist or health care provider to get one. Talk to your health care provider about the use of this drug in children. While it may be prescribed for children as young as newborns for selected conditions, precautions do apply. Overdosage: If you think you have taken too much of this medicine contact a poison control center or emergency room at once. NOTE: This medicine is only for you. Do not share this medicine with others. What if I miss a dose? It is important not to miss your dose. Call your health care provider if you are unable to keep an  appointment. If you give yourself this drug at home and you miss a dose, take it as soon as you can. If it is almost time for your next dose, take only that dose. Do not take double or extra doses. What may interact with this medicine? Do not take this medicine with any of the following medications:  intravenous calcium This medicine may also interact with the following medications:  birth control pills This list may not describe all possible interactions. Give your health care provider a list of all the medicines, herbs, non-prescription drugs, or dietary supplements you use. Also tell them if you smoke, drink alcohol, or use illegal drugs. Some items may interact with your medicine. What should I watch for while using this medicine? Tell your doctor or health care provider if your symptoms do not improve or if they get worse. This medicine may cause serious skin reactions. They can happen weeks to months after starting the medicine. Contact your health care provider right away if you notice fevers or flu-like symptoms with a rash. The rash may be red or purple and then turn into blisters or peeling of the skin. Or, you might notice a red rash with swelling of the face, lips or lymph nodes in your neck or under your arms. Do not treat diarrhea with over the counter products. Contact your doctor if you have diarrhea that lasts more than 2 days or if it is severe and watery. If you are being treated for   a sexually transmitted disease, avoid sexual contact until you have finished your treatment. Having sex can infect your sexual partner. Calcium may bind to this medicine and cause lung or kidney problems. Avoid calcium products while taking this medicine and for 48 hours after taking the last dose of this medicine. What side effects may I notice from receiving this medicine? Side effects that you should report to your doctor or health care professional as soon as possible:  allergic reactions like  skin rash, itching or hives, swelling of the face, lips, or tongue  breathing problems  fever, chills  irregular heartbeat  pain when passing urine  redness, blistering, peeling, or loosening of the skin, including inside the mouth  seizures  stomach pain, cramps  unusual bleeding, bruising  unusually weak or tired Side effects that usually do not require medical attention (report to your doctor or health care professional if they continue or are bothersome):  diarrhea  dizzy, drowsy  headache  nausea, vomiting  pain, swelling, irritation where injected  stomach upset  sweating This list may not describe all possible side effects. Call your doctor for medical advice about side effects. You may report side effects to FDA at 1-800-FDA-1088. Where should I keep my medicine? Keep out of the reach of children and pets. You will be instructed on how to store this drug. Protect from light. Throw away any unused drug after the expiration date. NOTE: This sheet is a summary. It may not cover all possible information. If you have questions about this medicine, talk to your doctor, pharmacist, or health care provider.  2021 Elsevier/Gold Standard (2018-08-25 18:29:21)

## 2020-05-14 NOTE — Patient Instructions (Signed)
Implanted Port Insertion, Care After This sheet gives you information about how to care for yourself after your procedure. Your health care provider may also give you more specific instructions. If you have problems or questions, contact your health care provider. What can I expect after the procedure? After the procedure, it is common to have:  Discomfort at the port insertion site.  Bruising on the skin over the port. This should improve over 3-4 days. Follow these instructions at home: Port care  After your port is placed, you will get a manufacturer's information card. The card has information about your port. Keep this card with you at all times.  Take care of the port as told by your health care provider. Ask your health care provider if you or a family member can get training for taking care of the port at home. A home health care nurse may also take care of the port.  Make sure to remember what type of port you have. Incision care  Follow instructions from your health care provider about how to take care of your port insertion site. Make sure you: ? Wash your hands with soap and water before and after you change your bandage (dressing). If soap and water are not available, use hand sanitizer. ? Change your dressing as told by your health care provider. ? Leave stitches (sutures), skin glue, or adhesive strips in place. These skin closures may need to stay in place for 2 weeks or longer. If adhesive strip edges start to loosen and curl up, you may trim the loose edges. Do not remove adhesive strips completely unless your health care provider tells you to do that.  Check your port insertion site every day for signs of infection. Check for: ? Redness, swelling, or pain. ? Fluid or blood. ? Warmth. ? Pus or a bad smell.      Activity  Return to your normal activities as told by your health care provider. Ask your health care provider what activities are safe for you.  Do not  lift anything that is heavier than 10 lb (4.5 kg), or the limit that you are told, until your health care provider says that it is safe. General instructions  Take over-the-counter and prescription medicines only as told by your health care provider.  Do not take baths, swim, or use a hot tub until your health care provider approves. Ask your health care provider if you may take showers. You may only be allowed to take sponge baths.  Do not drive for 24 hours if you were given a sedative during your procedure.  Wear a medical alert bracelet in case of an emergency. This will tell any health care providers that you have a port.  Keep all follow-up visits as told by your health care provider. This is important. Contact a health care provider if:  You cannot flush your port with saline as directed, or you cannot draw blood from the port.  You have a fever or chills.  You have redness, swelling, or pain around your port insertion site.  You have fluid or blood coming from your port insertion site.  Your port insertion site feels warm to the touch.  You have pus or a bad smell coming from the port insertion site. Get help right away if:  You have chest pain or shortness of breath.  You have bleeding from your port that you cannot control. Summary  Take care of the port as told by your   health care provider. Keep the manufacturer's information card with you at all times.  Change your dressing as told by your health care provider.  Contact a health care provider if you have a fever or chills or if you have redness, swelling, or pain around your port insertion site.  Keep all follow-up visits as told by your health care provider. This information is not intended to replace advice given to you by your health care provider. Make sure you discuss any questions you have with your health care provider. Document Revised: 08/17/2017 Document Reviewed: 08/17/2017 Elsevier Patient Education   2021 [employer]  

## 2020-05-14 NOTE — Progress Notes (Signed)
Hematology and Oncology Follow Up Visit  James Jennings [id number] 31-Dec-1970 50 y.o. 05/14/2020   Principle Diagnosis:  Follicular B- cell non-Hodgkin's lymphoma - Relapsed  Past Therapy:             Rituxan/bendamustine-s/p cycle 6 - completed on 07/03/2016  Current Therapy: CHOP-Gazyva -- start cycle #1 on 04/03/2020 Gazyva  - Cytoxan/ Vincristine/Prednisone  - started 11/07/2019, s/p cycle #4 --  D/c on 03/26/2020 G-CHOP -- s/p cycle #2 -- started on 04/03/2020    Interim History:  James Jennings is here today for follow-up.  He is doing quite well.  He has lost all of his hair which is to be expected.  We did do a PET scan on him.  The PET scan, in my opinion, show that he is in remission.  There is resolution of his adenopathy.  The right inguinal lymph node now has activity less than surrounding blood pool.  He really feels okay.  His blood sugars have been a little on the high side but not too bad.  He has had no problems with bowels or bladder.  He has had little bit of congestion.  We last saw him, he had a cough.  Chest x-ray was negative.  We will put him on some antibiotics which seem to help.  He has had a little bit of leg swelling.  There is been no rashes.  Overall, his performance status is ECOG 1.      Medications:  Allergies as of 05/14/2020      Reactions   Mango Flavor Swelling, Other (See Comments)   LIPS SWELL   Shellfish Allergy Anaphylaxis   Lactose Intolerance (gi) Diarrhea   Rituximab Other (See Comments)      Medication List       Accurate as of May 14, 2020  8:45 AM. If you have any questions, ask your nurse or doctor.        STOP taking these medications   Moderna COVID-19 Vaccine 100 MCG/0.5ML injection Generic drug: COVID-19 mRNA vaccine (Moderna) Stopped by: [name], MD     TAKE these medications   acetaminophen 325 MG tablet Commonly known as: TYLENOL Take 650 mg by mouth every 6 (six) hours as needed for moderate pain or  headache.   atorvastatin 80 MG tablet Commonly known as: LIPITOR TAKE 1 TABLET BY MOUTH EVERY DAY   cetirizine 10 MG tablet Commonly known as: ZYRTEC Take 10 mg by mouth daily as needed for allergies.   ibuprofen 200 MG tablet Commonly known as: ADVIL Take 600 mg by mouth every 8 (eight) hours as needed for mild pain or moderate pain.   lidocaine-prilocaine cream Commonly known as: EMLA Apply 1 application topically as needed. Apply to [facility] 1 hour prior to procedure.   LORazepam 0.5 MG tablet Commonly known as: ATIVAN Take 1 tablet (0.5 mg total) by mouth every 6 (six) hours as needed for anxiety.   metFORMIN 500 MG tablet Commonly known as: GLUCOPHAGE Take 1 tablet (500 mg total) by mouth daily.   montelukast 10 MG tablet Commonly known as: SINGULAIR TAKE 1 TABLET BY MOUTH AT BEDTIME. START 3 DAYS BEFORE CHEMO AND TAKE FOR 30 DAYS What changed: See the new instructions.   multivitamin Tabs tablet Take 1 tablet by mouth daily.   ondansetron 8 MG tablet Commonly known as: ZOFRAN Take 1 tablet (8 mg total) by mouth every 8 (eight) hours as needed for nausea or vomiting.   Ozempic (0.25 or 0.5  MG/DOSE) 2 MG/1.5ML Sopn Generic drug: Semaglutide(0.25 or 0.5MG /DOS) Inject 0.25 mg into the skin once a week.   predniSONE 20 MG tablet Commonly known as: DELTASONE Take 3 tablets (60 mg total) by mouth daily. Take on days 1-5 of chemotherapy.   prochlorperazine 10 MG tablet Commonly known as: COMPAZINE Take 1 tablet (10 mg total) by mouth every 6 (six) hours as needed for nausea or vomiting.       Allergies:  Allergies  Allergen Reactions  . Mango Flavor Swelling and Other (See Comments)    LIPS SWELL  . Shellfish Allergy Anaphylaxis  . Lactose Intolerance (Gi) Diarrhea  . Rituximab Other (See Comments)    Past Medical History, Surgical history, Social history, and Family History were reviewed and updated.  Review of Systems: Review of Systems  Constitutional:  Negative.   HENT: Negative.   Eyes: Negative.   Respiratory: Negative.   Cardiovascular: Negative.   Gastrointestinal: Negative.   Genitourinary: Negative.   Musculoskeletal: Negative.   Skin: Negative.   Neurological: Negative.   Endo/Heme/Allergies: Negative.   Psychiatric/Behavioral: Negative.      Physical Exam:  weight is 273 lb 1.9 oz (123.9 kg). His oral temperature is 97.2 F (36.2 C) (abnormal). His blood pressure is 128/72 and his pulse is 100. His respiration is 18 and oxygen saturation is 97%.   Wt Readings from Last 3 Encounters:  05/14/20 273 lb 1.9 oz (123.9 kg)  04/30/20 276 lb (125.2 kg)  04/24/20 272 lb (123.4 kg)    Physical Exam Vitals reviewed.  HENT:     Head: Normocephalic and atraumatic.  Eyes:     Pupils: Pupils are equal, round, and reactive to light.  Cardiovascular:     Rate and Rhythm: Normal rate and regular rhythm.     Heart sounds: Normal heart sounds.  Pulmonary:     Effort: Pulmonary effort is normal.     Breath sounds: Normal breath sounds.  Abdominal:     General: Bowel sounds are normal.     Palpations: Abdomen is soft.  Musculoskeletal:        General: No tenderness or deformity. Normal range of motion.     Cervical back: Normal range of motion.  Lymphadenopathy:     Cervical: No cervical adenopathy.  Skin:    General: Skin is warm and dry.     Findings: No erythema or rash.  Neurological:     Mental Status: He is alert and oriented to person, place, and time.  Psychiatric:        Behavior: Behavior normal.        Thought Content: Thought content normal.        Judgment: Judgment normal.      Lab Results  Component Value Date   WBC 5.1 05/14/2020   HGB 11.9 (L) 05/14/2020   HCT 34.5 (L) 05/14/2020   MCV 89.4 05/14/2020   PLT 356 05/14/2020   No results found for: FERRITIN, IRON, TIBC, UIBC, IRONPCTSAT Lab Results  Component Value Date   RETICCTPCT 1.07 12/06/2015   RBC 3.86 (L) 05/14/2020   RETICCTABS 48.79  12/06/2015   No results found for: Nils Pyle [facility] Lab Results  Component Value Date   IGGSERUM 785 06/04/2016   IGMSERUM 52 06/04/2016   Lab Results  Component Value Date   TOTALPROTELP 6.5 10/31/2019   ALBUMINELP 3.7 10/31/2019   A1GS 0.2 10/31/2019   A2GS 0.7 10/31/2019   BETS 1.1 10/31/2019   GAMS 0.8 10/31/2019   MSPIKE  Not Observed 10/31/2019     Chemistry      Component Value Date/Time   NA 138 05/14/2020 0805   NA 138 03/14/2020 0839   NA 141 12/03/2016 1155   NA 141 02/07/2016 1149   K 4.4 05/14/2020 0805   K 3.8 12/03/2016 1155   K 4.3 02/07/2016 1149   CL 103 05/14/2020 0805   CL 100 12/03/2016 1155   CO2 27 05/14/2020 0805   CO2 27 12/03/2016 1155   CO2 27 02/07/2016 1149   BUN 20 05/14/2020 0805   BUN 15 03/14/2020 0839   BUN 10 12/03/2016 1155   BUN 10.9 02/07/2016 1149   CREATININE 0.98 05/14/2020 0805   CREATININE 1.0 12/03/2016 1155   CREATININE 0.9 02/07/2016 1149      Component Value Date/Time   CALCIUM 9.6 05/14/2020 0805   CALCIUM 9.1 12/03/2016 1155   CALCIUM 9.6 02/07/2016 1149   ALKPHOS 101 05/14/2020 0805   ALKPHOS 99 (H) 12/03/2016 1155   ALKPHOS 127 02/07/2016 1149   AST 19 05/14/2020 0805   AST 17 02/07/2016 1149   ALT 31 05/14/2020 0805   ALT 24 12/03/2016 1155   ALT 16 02/07/2016 1149   BILITOT 0.4 05/14/2020 0805   BILITOT 0.42 02/07/2016 1149       Impression and Plan: [name] a pleasant 50 yo caucasian gentleman with history of follicular large cell non-Hodgkin's lymphoma. He completed 6 cycles of chemotherapy with bendamustine on 07/03/2016 but was not tolerant of Rituxan (anaphylaxis). His lymphoma has now recurred.   We will go ahead with his third cycle of chemotherapy.  I think that with him doing so well, we will go with 6 cycles of treatment.  I think this would be very reasonable.  After this, then I would consider him for maintenance Gazyva.  We will plan to get him back in another 3  weeks.     [name], MD 4/12/20228:45 AM

## 2020-05-15 ENCOUNTER — Ambulatory Visit (INDEPENDENT_AMBULATORY_CARE_PROVIDER_SITE_OTHER): Payer: BC Managed Care – PPO | Admitting: Physician Assistant

## 2020-05-16 ENCOUNTER — Other Ambulatory Visit: Payer: Self-pay | Admitting: *Deleted

## 2020-05-16 ENCOUNTER — Telehealth: Payer: Self-pay | Admitting: *Deleted

## 2020-05-16 MED ORDER — METOCLOPRAMIDE HCL 10 MG PO TABS: 10.0000 mg | ORAL_TABLET | Freq: Four times a day (QID) | ORAL | 2 refills | Status: DC | PRN

## 2020-05-16 NOTE — Telephone Encounter (Signed)
Call received from patient's wife stating that pt is having issues with nausea the first three days after chemo when he is not able to take Zofran.  She states that once he is able to take the Zofran and Compazine together, the nausea is manageable and that he does not like to take the Ativan d/t it makes him lethargic and he is trying to work from home. Dr. Marin Olp notified.  Patient's wife notified per order of Dr. Marin Olp that a prescription for Reglan 10 mg PO every six hours PRN will be sent in to his pharmacy and that pt is to take this along with the Compazine as needed.  Pt.s wife appreciative of assistance and has no further questions at this time.

## 2020-05-20 ENCOUNTER — Other Ambulatory Visit (INDEPENDENT_AMBULATORY_CARE_PROVIDER_SITE_OTHER): Payer: Self-pay | Admitting: Family Medicine

## 2020-05-20 DIAGNOSIS — E1169 Type 2 diabetes mellitus with other specified complication: Secondary | ICD-10-CM

## 2020-05-21 ENCOUNTER — Ambulatory Visit (INDEPENDENT_AMBULATORY_CARE_PROVIDER_SITE_OTHER): Payer: BC Managed Care – PPO | Admitting: Physician Assistant

## 2020-05-21 ENCOUNTER — Encounter (INDEPENDENT_AMBULATORY_CARE_PROVIDER_SITE_OTHER): Payer: Self-pay | Admitting: Physician Assistant

## 2020-05-21 ENCOUNTER — Other Ambulatory Visit: Payer: Self-pay

## 2020-05-21 VITALS — BP 120/77 | HR 98 | Temp 97.9°F | Ht 71.0 in | Wt 269.0 lb

## 2020-05-21 DIAGNOSIS — E1169 Type 2 diabetes mellitus with other specified complication: Secondary | ICD-10-CM

## 2020-05-21 DIAGNOSIS — Z9189 Other specified personal risk factors, not elsewhere classified: Secondary | ICD-10-CM | POA: Diagnosis not present

## 2020-05-21 DIAGNOSIS — E7849 Other hyperlipidemia: Secondary | ICD-10-CM | POA: Diagnosis not present

## 2020-05-21 DIAGNOSIS — Z6838 Body mass index (BMI) 38.0-38.9, adult: Secondary | ICD-10-CM

## 2020-05-21 MED ORDER — METFORMIN HCL 500 MG PO TABS: 500.0000 mg | ORAL_TABLET | Freq: Every day | ORAL | 1 refills | Status: DC

## 2020-05-22 NOTE — Progress Notes (Signed)
Chief Complaint:   OBESITY James Jennings is here to discuss his progress with his obesity treatment plan along with follow-up of his obesity related diagnoses. James Jennings is on the Category 4 Plan and states he is following his eating plan approximately 85% of the time. James Jennings states he is not currently exercising.  Today's visit was #: 5 Starting weight: 277 lbs Starting date: 03/14/2020 Today's weight: 269 lbs Today's date: 05/21/2020 Total lbs lost to date: 8 Total lbs lost since last in-office visit: 3  Interim History: James Jennings reports that the last few weeks have been difficult due to his chemo treatments. Breakfast and lunch are easy for him but dinner is more challenging.  Subjective:   1. Type 2 diabetes mellitus with other specified complication, without long-term current use of insulin ([name]) James Jennings's last A1c was 8.2. He is on Metformin and Ozempic.  2. Other hyperlipidemia James Jennings is on Lipitor. He denies chest pain and myalgias.  3. At risk for activity intolerance James Jennings is at risk for exercise intolerance due to lack of exercise.  Assessment/Plan:   1. Type 2 diabetes mellitus with other specified complication, without long-term current use of insulin ([name]) Good blood sugar control is important to decrease the likelihood of diabetic complications such as nephropathy, neuropathy, limb loss, blindness, coronary artery disease, and death. Intensive lifestyle modification including diet, exercise and weight loss are the first line of treatment for diabetes. Increase Ozempic to 0.5 mg (no script needed).  - metFORMIN (GLUCOPHAGE) 500 MG tablet; Take 1 tablet (500 mg total) by mouth daily.  Dispense: 30 tablet; Refill: 1  2. Other hyperlipidemia Cardiovascular risk and specific lipid/LDL goals reviewed.  We discussed several lifestyle modifications today and James Jennings will continue to work on diet, exercise and weight loss efforts. Orders and follow up as documented in patient record. Continue with  meds and monitor lipids.  Counseling Intensive lifestyle modifications are the first line treatment for this issue. . Dietary changes: Increase soluble fiber. Decrease simple carbohydrates. . Exercise changes: Moderate to vigorous-intensity aerobic activity 150 minutes per week if tolerated. . Lipid-lowering medications: see documented in medical record.  3. At risk for activity intolerance James Jennings was given approximately 15 minutes of exercise intolerance counseling today. He is 50 y.o. male and has risk factors exercise intolerance including obesity. We discussed intensive lifestyle modifications today with an emphasis on specific weight loss instructions and strategies. James Jennings will slowly increase activity as tolerated.  Repetitive spaced learning was employed today to elicit superior memory formation and behavioral change.  4. Class 2 severe obesity with serious comorbidity and body mass index (BMI) of 38.0 to 38.9 in adult, unspecified obesity type James Jennings) James Jennings is currently in the action stage of change. As such, his goal is to continue with weight loss efforts. He has agreed to the Category 4 Plan.   Exercise goals: All adults should avoid inactivity. Some physical activity is better than none, and adults who participate in any amount of physical activity gain some health benefits.  Behavioral modification strategies: decreasing eating out and meal planning and cooking strategies.  James Jennings has agreed to follow-up with our clinic in 3 weeks. He was informed of the importance of frequent follow-up visits to maximize his success with intensive lifestyle modifications for his multiple health conditions.   Objective:   Blood pressure 120/77, pulse 98, temperature 97.9 F (36.6 C), height 5\' 11"  (1.803 m), weight 269 lb (122 kg), SpO2 97 %. Body mass index is 37.52 kg/m.  General: Cooperative, alert, well developed, in no acute distress. HEENT: Conjunctivae and lids unremarkable. Cardiovascular:  Regular rhythm.  Lungs: Normal work of breathing. Neurologic: No focal deficits.   Lab Results  Component Value Date   CREATININE 0.98 05/14/2020   BUN 20 05/14/2020   NA 138 05/14/2020   K 4.4 05/14/2020   CL 103 05/14/2020   CO2 27 05/14/2020   Lab Results  Component Value Date   ALT 31 05/14/2020   AST 19 05/14/2020   ALKPHOS 101 05/14/2020   BILITOT 0.4 05/14/2020   Lab Results  Component Value Date   HGBA1C 8.2 (H) 03/14/2020   HGBA1C 6.2 (H) 08/10/2018   Lab Results  Component Value Date   INSULIN 28.0 (H) 03/14/2020   INSULIN 24.6 08/10/2018   Lab Results  Component Value Date   TSH 2.960 03/14/2020   Lab Results  Component Value Date   CHOL 166 03/14/2020   HDL 39 (L) 03/14/2020   LDLCALC 99 03/14/2020   TRIG 160 (H) 03/14/2020   Lab Results  Component Value Date   WBC 5.1 05/14/2020   HGB 11.9 (L) 05/14/2020   HCT 34.5 (L) 05/14/2020   MCV 89.4 05/14/2020   PLT 356 05/14/2020    Attestation Statements:   Reviewed by clinician on day of visit: allergies, medications, problem list, medical history, surgical history, family history, social history, and previous encounter notes.  Coral Ceo, am acting as Location manager for [employer], PA-C.  I have reviewed the above documentation for accuracy and completeness, and I agree with the above. Abby Potash, PA-C

## 2020-06-03 ENCOUNTER — Other Ambulatory Visit (INDEPENDENT_AMBULATORY_CARE_PROVIDER_SITE_OTHER): Payer: Self-pay | Admitting: Physician Assistant

## 2020-06-03 ENCOUNTER — Other Ambulatory Visit (INDEPENDENT_AMBULATORY_CARE_PROVIDER_SITE_OTHER): Payer: Self-pay | Admitting: Family Medicine

## 2020-06-03 DIAGNOSIS — E782 Mixed hyperlipidemia: Secondary | ICD-10-CM

## 2020-06-03 DIAGNOSIS — E1169 Type 2 diabetes mellitus with other specified complication: Secondary | ICD-10-CM

## 2020-06-03 NOTE — Telephone Encounter (Signed)
Ok to refill   thanks

## 2020-06-03 NOTE — Telephone Encounter (Signed)
Is this okay to refill? He will short three days.

## 2020-06-03 NOTE — Telephone Encounter (Signed)
Can this be refilled? 

## 2020-06-03 NOTE — Telephone Encounter (Signed)
Yes, at a 0.5mg  dose please. Thank you!

## 2020-06-03 NOTE — Telephone Encounter (Signed)
Pt last seen by Tracey Aguilar, PA-C.  

## 2020-06-03 NOTE — Telephone Encounter (Signed)
James Jennings 

## 2020-06-04 ENCOUNTER — Telehealth: Payer: Self-pay

## 2020-06-04 NOTE — Telephone Encounter (Signed)
R/s 6/15 appts to 6/14 as  Dr Marin Olp will be out of the office, will give pt new appts at 06/05/20 appt   James Jennings

## 2020-06-05 ENCOUNTER — Inpatient Hospital Stay: Payer: BC Managed Care – PPO

## 2020-06-05 ENCOUNTER — Inpatient Hospital Stay: Payer: BC Managed Care – PPO | Attending: Hematology & Oncology

## 2020-06-05 ENCOUNTER — Inpatient Hospital Stay (HOSPITAL_BASED_OUTPATIENT_CLINIC_OR_DEPARTMENT_OTHER): Payer: BC Managed Care – PPO | Admitting: Hematology & Oncology

## 2020-06-05 ENCOUNTER — Other Ambulatory Visit: Payer: Self-pay

## 2020-06-05 ENCOUNTER — Encounter: Payer: Self-pay | Admitting: Hematology & Oncology

## 2020-06-05 VITALS — BP 131/67 | HR 95 | Temp 98.2°F | Resp 17 | Wt 274.0 lb

## 2020-06-05 VITALS — BP 130/67 | HR 96 | Temp 98.1°F | Resp 17

## 2020-06-05 DIAGNOSIS — Z79899 Other long term (current) drug therapy: Secondary | ICD-10-CM | POA: Insufficient documentation

## 2020-06-05 DIAGNOSIS — C8223 Follicular lymphoma grade III, unspecified, intra-abdominal lymph nodes: Secondary | ICD-10-CM | POA: Insufficient documentation

## 2020-06-05 DIAGNOSIS — Z5112 Encounter for antineoplastic immunotherapy: Secondary | ICD-10-CM | POA: Diagnosis not present

## 2020-06-05 DIAGNOSIS — Z5111 Encounter for antineoplastic chemotherapy: Secondary | ICD-10-CM | POA: Diagnosis not present

## 2020-06-05 LAB — CBC WITH DIFFERENTIAL (CANCER CENTER ONLY)
Abs Immature Granulocytes: 0.03 10*3/uL (ref 0.00–0.07)
Basophils Absolute: 0 10*3/uL (ref 0.0–0.1)
Basophils Relative: 1 %
Eosinophils Absolute: 0.1 10*3/uL (ref 0.0–0.5)
Eosinophils Relative: 1 %
HCT: 32.5 % — ABNORMAL LOW (ref 39.0–52.0)
Hemoglobin: 11.2 g/dL — ABNORMAL LOW (ref 13.0–17.0)
Immature Granulocytes: 1 %
Lymphocytes Relative: 10 %
Lymphs Abs: 0.6 10*3/uL — ABNORMAL LOW (ref 0.7–4.0)
MCH: 30.8 pg (ref 26.0–34.0)
MCHC: 34.5 g/dL (ref 30.0–36.0)
MCV: 89.3 fL (ref 80.0–100.0)
Monocytes Absolute: 0.9 10*3/uL (ref 0.1–1.0)
Monocytes Relative: 16 %
Neutro Abs: 4 10*3/uL (ref 1.7–7.7)
Neutrophils Relative %: 71 %
Platelet Count: 270 10*3/uL (ref 150–400)
RBC: 3.64 MIL/uL — ABNORMAL LOW (ref 4.22–5.81)
RDW: 14.5 % (ref 11.5–15.5)
WBC Count: 5.5 10*3/uL (ref 4.0–10.5)
nRBC: 0 % (ref 0.0–0.2)

## 2020-06-05 LAB — CMP (CANCER CENTER ONLY)
ALT: 32 U/L (ref 0–44)
AST: 21 U/L (ref 15–41)
Albumin: 4.1 g/dL (ref 3.5–5.0)
Alkaline Phosphatase: 97 U/L (ref 38–126)
Anion gap: 7 (ref 5–15)
BUN: 14 mg/dL (ref 6–20)
CO2: 28 mmol/L (ref 22–32)
Calcium: 9.4 mg/dL (ref 8.9–10.3)
Chloride: 105 mmol/L (ref 98–111)
Creatinine: 0.94 mg/dL (ref 0.61–1.24)
GFR, Estimated: >60 mL/min (ref >60)
Glucose, Bld: 138 mg/dL — ABNORMAL HIGH (ref 70–99)
Potassium: 4.4 mmol/L (ref 3.5–5.1)
Sodium: 140 mmol/L (ref 135–145)
Total Bilirubin: 0.5 mg/dL (ref 0.3–1.2)
Total Protein: 6.4 g/dL — ABNORMAL LOW (ref 6.5–8.1)

## 2020-06-05 LAB — LACTATE DEHYDROGENASE: LDH: 173 U/L (ref 98–192)

## 2020-06-05 MED ORDER — SODIUM CHLORIDE 0.9% FLUSH
10.0000 mL | INTRAVENOUS | Status: DC | PRN
  Administered 2020-06-05: 10 mL
  Filled 2020-06-05: qty 10

## 2020-06-05 MED ORDER — SODIUM CHLORIDE 0.9 % IV SOLN
Freq: Once | INTRAVENOUS | Status: AC
  Filled 2020-06-05: qty 250

## 2020-06-05 MED ORDER — PALONOSETRON HCL INJECTION 0.25 MG/5ML
INTRAVENOUS | Status: AC
  Filled 2020-06-05: qty 5

## 2020-06-05 MED ORDER — SODIUM CHLORIDE 0.9 % IV SOLN
150.0000 mg | Freq: Once | INTRAVENOUS | Status: AC
  Administered 2020-06-05: 150 mg via INTRAVENOUS
  Filled 2020-06-05: qty 150

## 2020-06-05 MED ORDER — SODIUM CHLORIDE 0.9 % IV SOLN
750.0000 mg/m2 | Freq: Once | INTRAVENOUS | Status: AC
  Administered 2020-06-05: 1900 mg via INTRAVENOUS
  Filled 2020-06-05: qty 95

## 2020-06-05 MED ORDER — PALONOSETRON HCL INJECTION 0.25 MG/5ML
0.2500 mg | Freq: Once | INTRAVENOUS | Status: AC
Start: 2020-06-05 — End: 2020-06-05
  Administered 2020-06-05: 0.25 mg via INTRAVENOUS

## 2020-06-05 MED ORDER — ACETAMINOPHEN 325 MG PO TABS
ORAL_TABLET | ORAL | Status: AC
  Filled 2020-06-05: qty 2

## 2020-06-05 MED ORDER — DIPHENHYDRAMINE HCL 50 MG/ML IJ SOLN
12.5000 mg | Freq: Once | INTRAMUSCULAR | Status: AC
  Administered 2020-06-05: 12.5 mg via INTRAVENOUS

## 2020-06-05 MED ORDER — SODIUM CHLORIDE 0.9 % IV SOLN
1000.0000 mg | Freq: Once | INTRAVENOUS | Status: AC
  Administered 2020-06-05: 1000 mg via INTRAVENOUS
  Filled 2020-06-05: qty 40

## 2020-06-05 MED ORDER — DOXORUBICIN HCL CHEMO IV INJECTION 2 MG/ML
50.0000 mg/m2 | Freq: Once | INTRAVENOUS | Status: AC
  Administered 2020-06-05: 126 mg via INTRAVENOUS
  Filled 2020-06-05: qty 63

## 2020-06-05 MED ORDER — SODIUM CHLORIDE 0.9 % IV SOLN
40.0000 mg | Freq: Once | INTRAVENOUS | Status: AC
  Administered 2020-06-05: 40 mg via INTRAVENOUS
  Filled 2020-06-05: qty 4

## 2020-06-05 MED ORDER — SODIUM CHLORIDE 0.9 % IV SOLN
10.0000 mg | Freq: Once | INTRAVENOUS | Status: AC
  Administered 2020-06-05: 10 mg via INTRAVENOUS
  Filled 2020-06-05: qty 10

## 2020-06-05 MED ORDER — ACETAMINOPHEN 325 MG PO TABS
650.0000 mg | ORAL_TABLET | Freq: Once | ORAL | Status: AC
  Administered 2020-06-05: 650 mg via ORAL

## 2020-06-05 MED ORDER — DIPHENHYDRAMINE HCL 50 MG/ML IJ SOLN
INTRAMUSCULAR | Status: AC
  Filled 2020-06-05: qty 1

## 2020-06-05 MED ORDER — SODIUM CHLORIDE 0.9 % IV SOLN
Freq: Once | INTRAVENOUS | Status: DC
  Filled 2020-06-05: qty 250

## 2020-06-05 MED ORDER — DIPHENHYDRAMINE HCL 25 MG PO CAPS
ORAL_CAPSULE | ORAL | Status: AC
  Filled 2020-06-05: qty 1

## 2020-06-05 MED ORDER — VINCRISTINE SULFATE CHEMO INJECTION 1 MG/ML
1.5000 mg | Freq: Once | INTRAVENOUS | Status: AC
  Administered 2020-06-05: 1.5 mg via INTRAVENOUS
  Filled 2020-06-05: qty 1.5

## 2020-06-05 MED ORDER — HEPARIN SOD (PORK) LOCK FLUSH 100 UNIT/ML IV SOLN
500.0000 [IU] | Freq: Once | INTRAVENOUS | Status: AC | PRN
  Administered 2020-06-05: 500 [IU]
  Filled 2020-06-05: qty 5

## 2020-06-05 NOTE — Patient Instructions (Signed)
[facility]  Discharge Instructions: Thank you for choosing [facility] to provide your oncology and hematology care.   If you have a lab appointment with the [facility], please go directly to the [facility] and check in at the registration area.  Wear comfortable clothing and clothing appropriate for easy access to any Portacath or PICC line.   We strive to give you quality time with your provider. You may need to reschedule your appointment if you arrive late (15 or more minutes).  Arriving late affects you and other patients whose appointments are after yours.  Also, if you miss three or more appointments without notifying the office, you may be dismissed from the clinic at the provider's discretion.      For prescription refill requests, have your pharmacy contact our office and allow 72 hours for refills to be completed.    Today you received the following chemotherapy and/or immunotherapy agents Adriamycin, Cytoxan, Vincristine, and Gadzyva.   To help prevent nausea and vomiting after your treatment, we encourage you to take your nausea medication as directed.  BELOW ARE SYMPTOMS THAT SHOULD BE REPORTED IMMEDIATELY: . *FEVER GREATER THAN 100.4 F (38 C) OR HIGHER . *CHILLS OR SWEATING . *NAUSEA AND VOMITING THAT IS NOT CONTROLLED WITH YOUR NAUSEA MEDICATION . *UNUSUAL SHORTNESS OF BREATH . *UNUSUAL BRUISING OR BLEEDING . *URINARY PROBLEMS (pain or burning when urinating, or frequent urination) . *BOWEL PROBLEMS (unusual diarrhea, constipation, pain near the anus) . TENDERNESS IN MOUTH AND THROAT WITH OR WITHOUT PRESENCE OF ULCERS (sore throat, sores in mouth, or a toothache) . UNUSUAL RASH, SWELLING OR PAIN  . UNUSUAL VAGINAL DISCHARGE OR ITCHING   Items with * indicate a potential emergency and should be followed up as soon as possible or go to the Emergency Department if any problems should occur.  Please show the CHEMOTHERAPY ALERT  CARD or IMMUNOTHERAPY ALERT CARD at check-in to the Emergency Department and triage nurse. Should you have questions after your visit or need to cancel or reschedule your appointment, please contact [facility]  [phone] and follow the prompts.  Office hours are 8:00 a.m. to 4:30 p.m. Monday - Friday. Please note that voicemails left after 4:00 p.m. may not be returned until the following business day.  We are closed weekends and major holidays. You have access to a nurse at all times for urgent questions. Please call the main number to the clinic [phone] and follow the prompts.  For any non-urgent questions, you may also contact your provider using MyChart. We now offer e-Visits for anyone 52 and older to request care online for non-urgent symptoms. For details visit [email or web address].   Also download the MyChart app! Go to the app store, search "MyChart", open the app, select [facility], and log in with your MyChart username and password.  Due to Covid, a mask is required upon entering the hospital/clinic. If you do not have a mask, one will be given to you upon arrival. For doctor visits, patients may have 1 support person aged 17 or older with them. For treatment visits, patients cannot have anyone with them due to current Covid guidelines and our immunocompromised population.

## 2020-06-05 NOTE — Patient Instructions (Signed)

## 2020-06-05 NOTE — Progress Notes (Signed)
Hematology and Oncology Follow Up Visit  James Jennings [id number] 1970/08/31 50 y.o. 06/05/2020   Principle Diagnosis:  Follicular B- cell non-Hodgkin's lymphoma - Relapsed  Past Therapy:             Rituxan/bendamustine-s/p cycle 6 - completed on 07/03/2016  Current Therapy: [facility]-Gazyva -- start cycle #1 on 04/03/2020 Gazyva  - Cytoxan/ Vincristine/Prednisone  - started 11/07/2019, s/p cycle #4 --  D/c on 03/26/2020 G-[facility] -- s/p cycle #3 -- started on 04/03/2020    Interim History:  James Jennings is here today for follow-up.  He looks good.  He feels okay.  He is more tired.  After the last cycle of treatment, he did have more than 2 in the way of nausea and vomiting.  He also has some diarrhea last week.  This may have been a gastrointestinal virus.  The Ativan seems to help with the vomiting.  This mostly works at nighttime for him.  He and his wife had a wonderful time at the beach.  I think there there the week after Easter.  That a great time.  The weather was perfect.  He says he ate too much.  He has had no rashes.  He has had a little swelling in the left leg.  He has had no fever.  There is been no cough.  He has had no mouth sores.  Overall, his performance status is ECOG 1.   Medications:  Allergies as of 06/05/2020      Reactions   Mango Flavor Swelling, Other (See Comments)   LIPS SWELL   Shellfish Allergy Anaphylaxis   Lactose Intolerance (gi) Diarrhea   Rituximab Other (See Comments)      Medication List       Accurate as of Jun 05, 2020  8:33 AM. If you have any questions, ask your nurse or doctor.        acetaminophen 325 MG tablet Commonly known as: TYLENOL Take 650 mg by mouth every 6 (six) hours as needed for moderate pain or headache.   atorvastatin 80 MG tablet Commonly known as: LIPITOR TAKE 1 TABLET BY MOUTH EVERY DAY   cetirizine 10 MG tablet Commonly known as: ZYRTEC Take 10 mg by mouth daily as needed for allergies.   ibuprofen 200 MG  tablet Commonly known as: ADVIL Take 600 mg by mouth every 8 (eight) hours as needed for mild pain or moderate pain.   lidocaine-prilocaine cream Commonly known as: EMLA Apply 1 application topically as needed. Apply to [facility] 1 hour prior to procedure.   LORazepam 0.5 MG tablet Commonly known as: ATIVAN Take 1 tablet (0.5 mg total) by mouth every 6 (six) hours as needed for anxiety.   metFORMIN 500 MG tablet Commonly known as: GLUCOPHAGE Take 1 tablet (500 mg total) by mouth daily.   metoCLOPramide 10 MG tablet Commonly known as: REGLAN Take 1 tablet (10 mg total) by mouth every 6 (six) hours as needed for nausea.   montelukast 10 MG tablet Commonly known as: SINGULAIR TAKE 1 TABLET BY MOUTH AT BEDTIME. START 3 DAYS BEFORE CHEMO AND TAKE FOR 30 DAYS What changed: See the new instructions.   multivitamin Tabs tablet Take 1 tablet by mouth daily.   ondansetron 8 MG tablet Commonly known as: ZOFRAN Take 1 tablet (8 mg total) by mouth every 8 (eight) hours as needed for nausea or vomiting.   Ozempic (0.25 or 0.5 MG/DOSE) 2 MG/1.5ML Sopn Generic drug: Semaglutide(0.25 or 0.5MG /DOS) INJECT 0.25MG  INTO THE  SKIN ONE TIME PER WEEK   predniSONE 20 MG tablet Commonly known as: DELTASONE Take 3 tablets (60 mg total) by mouth daily. Take on days 1-5 of chemotherapy.   prochlorperazine 10 MG tablet Commonly known as: COMPAZINE Take 1 tablet (10 mg total) by mouth every 6 (six) hours as needed for nausea or vomiting.       Allergies:  Allergies  Allergen Reactions  . Mango Flavor Swelling and Other (See Comments)    LIPS SWELL  . Shellfish Allergy Anaphylaxis  . Lactose Intolerance (Gi) Diarrhea  . Rituximab Other (See Comments)    Past Medical History, Surgical history, Social history, and Family History were reviewed and updated.  Review of Systems: Review of Systems  Constitutional: Negative.   HENT: Negative.   Eyes: Negative.   Respiratory: Negative.    Cardiovascular: Negative.   Gastrointestinal: Negative.   Genitourinary: Negative.   Musculoskeletal: Negative.   Skin: Negative.   Neurological: Negative.   Endo/Heme/Allergies: Negative.   Psychiatric/Behavioral: Negative.      Physical Exam:  weight is 274 lb (124.3 kg). His oral temperature is 98.2 F (36.8 C). His blood pressure is 131/67 and his pulse is 95. His respiration is 17 and oxygen saturation is 100%.   Wt Readings from Last 3 Encounters:  06/05/20 274 lb (124.3 kg)  05/21/20 269 lb (122 kg)  05/14/20 273 lb 1.9 oz (123.9 kg)    Physical Exam Vitals reviewed.  HENT:     Head: Normocephalic and atraumatic.  Eyes:     Pupils: Pupils are equal, round, and reactive to light.  Cardiovascular:     Rate and Rhythm: Normal rate and regular rhythm.     Heart sounds: Normal heart sounds.  Pulmonary:     Effort: Pulmonary effort is normal.     Breath sounds: Normal breath sounds.  Abdominal:     General: Bowel sounds are normal.     Palpations: Abdomen is soft.  Musculoskeletal:        General: No tenderness or deformity. Normal range of motion.     Cervical back: Normal range of motion.  Lymphadenopathy:     Cervical: No cervical adenopathy.  Skin:    General: Skin is warm and dry.     Findings: No erythema or rash.  Neurological:     Mental Status: He is alert and oriented to person, place, and time.  Psychiatric:        Behavior: Behavior normal.        Thought Content: Thought content normal.        Judgment: Judgment normal.      Lab Results  Component Value Date   WBC 5.5 06/05/2020   HGB 11.2 (L) 06/05/2020   HCT 32.5 (L) 06/05/2020   MCV 89.3 06/05/2020   PLT 270 06/05/2020   No results found for: FERRITIN, IRON, TIBC, UIBC, IRONPCTSAT Lab Results  Component Value Date   RETICCTPCT 1.07 12/06/2015   RBC 3.64 (L) 06/05/2020   RETICCTABS 48.79 12/06/2015   No results found for: Nils Pyle [facility] Lab Results   Component Value Date   IGGSERUM 785 06/04/2016   IGMSERUM 52 06/04/2016   Lab Results  Component Value Date   TOTALPROTELP 6.5 10/31/2019   ALBUMINELP 3.7 10/31/2019   A1GS 0.2 10/31/2019   A2GS 0.7 10/31/2019   BETS 1.1 10/31/2019   GAMS 0.8 10/31/2019   MSPIKE Not Observed 10/31/2019     Chemistry      Component Value Date/Time  NA 138 05/14/2020 0805   NA 138 03/14/2020 0839   NA 141 12/03/2016 1155   NA 141 02/07/2016 1149   K 4.4 05/14/2020 0805   K 3.8 12/03/2016 1155   K 4.3 02/07/2016 1149   CL 103 05/14/2020 0805   CL 100 12/03/2016 1155   CO2 27 05/14/2020 0805   CO2 27 12/03/2016 1155   CO2 27 02/07/2016 1149   BUN 20 05/14/2020 0805   BUN 15 03/14/2020 0839   BUN 10 12/03/2016 1155   BUN 10.9 02/07/2016 1149   CREATININE 0.98 05/14/2020 0805   CREATININE 1.0 12/03/2016 1155   CREATININE 0.9 02/07/2016 1149      Component Value Date/Time   CALCIUM 9.6 05/14/2020 0805   CALCIUM 9.1 12/03/2016 1155   CALCIUM 9.6 02/07/2016 1149   ALKPHOS 101 05/14/2020 0805   ALKPHOS 99 (H) 12/03/2016 1155   ALKPHOS 127 02/07/2016 1149   AST 19 05/14/2020 0805   AST 17 02/07/2016 1149   ALT 31 05/14/2020 0805   ALT 24 12/03/2016 1155   ALT 16 02/07/2016 1149   BILITOT 0.4 05/14/2020 0805   BILITOT 0.42 02/07/2016 1149       Impression and Plan: [name] a pleasant 51 yo caucasian gentleman with history of follicular large cell non-Hodgkin's lymphoma. He completed 6 cycles of chemotherapy with bendamustine on 07/03/2016 but was not tolerant of Rituxan (anaphylaxis). His lymphoma has now recurred.   We will go ahead with his fourth  cycle of chemotherapy.  I think that with him doing so well, we will go with 6 cycles of treatment.  I think this would be very reasonable.  After this, then I would consider him for maintenance Gazyva.  We will plan to get him back in another 3 weeks.     [name], MD 5/4/20228:33 AM

## 2020-06-05 NOTE — Addendum Note (Signed)
Addended by: Burney Gauze R on: 06/05/2020 09:08 AM   Modules accepted: Orders

## 2020-06-07 ENCOUNTER — Other Ambulatory Visit: Payer: Self-pay

## 2020-06-07 DIAGNOSIS — C8223 Follicular lymphoma grade III, unspecified, intra-abdominal lymph nodes: Secondary | ICD-10-CM

## 2020-06-07 MED ORDER — PREDNISONE 20 MG PO TABS: 60.0000 mg | ORAL_TABLET | Freq: Every day | ORAL | 0 refills | Status: DC

## 2020-06-07 MED ORDER — BACLOFEN 10 MG PO TABS: 10.0000 mg | ORAL_TABLET | Freq: Three times a day (TID) | ORAL | 0 refills | Status: DC

## 2020-06-11 ENCOUNTER — Ambulatory Visit (INDEPENDENT_AMBULATORY_CARE_PROVIDER_SITE_OTHER): Payer: BC Managed Care – PPO | Admitting: Physician Assistant

## 2020-06-11 ENCOUNTER — Other Ambulatory Visit: Payer: Self-pay

## 2020-06-11 ENCOUNTER — Encounter (INDEPENDENT_AMBULATORY_CARE_PROVIDER_SITE_OTHER): Payer: Self-pay | Admitting: Physician Assistant

## 2020-06-11 VITALS — BP 120/79 | HR 128 | Temp 98.5°F | Ht 71.0 in | Wt 268.0 lb

## 2020-06-11 DIAGNOSIS — E7849 Other hyperlipidemia: Secondary | ICD-10-CM | POA: Diagnosis not present

## 2020-06-11 DIAGNOSIS — E1169 Type 2 diabetes mellitus with other specified complication: Secondary | ICD-10-CM | POA: Diagnosis not present

## 2020-06-11 DIAGNOSIS — Z6838 Body mass index (BMI) 38.0-38.9, adult: Secondary | ICD-10-CM

## 2020-06-11 DIAGNOSIS — Z9189 Other specified personal risk factors, not elsewhere classified: Secondary | ICD-10-CM | POA: Diagnosis not present

## 2020-06-11 DIAGNOSIS — E559 Vitamin D deficiency, unspecified: Secondary | ICD-10-CM

## 2020-06-11 MED ORDER — METFORMIN HCL 500 MG PO TABS: 500.0000 mg | ORAL_TABLET | Freq: Every day | ORAL | 1 refills | Status: DC

## 2020-06-11 NOTE — Progress Notes (Signed)
Chief Complaint:   OBESITY James Jennings is here to discuss his progress with his obesity treatment plan along with follow-up of his obesity related diagnoses. [name] is on the Category 4 Plan and states he is following his eating plan approximately 70% of the time. Renji states he is doing 0 minutes 0 times per week.  Today's visit was #: 6 Starting weight: 277 lbs Starting date: 03/14/2020 Today's weight: 268 lbs Today's date: 06/11/2020 Total lbs lost to date: 9 Total lbs lost since last in-office visit: 1  Interim History: [name] reports that he was very nauseous last week. He was nauseous from chemo and was eating soup, and therefore not eating enough protein. He is on steroids with each chemo treatment and his blood sugars have been elevated.  Subjective:   1. Type 2 diabetes mellitus with other specified complication, without long-term current use of insulin ([name]) [name] is on metformin and Ozempic. He is also on steroids and his blood sugars have been elevated.  2. Vitamin D deficiency [name] is not on Vit D, and his last level was not at goal.  3. Other hyperlipidemia [name] is on atorvastatin, and he denies chest pain or headache.  4. At risk for hyperglycemia [name] is at increased risk for hypoglycemia due to steroid use.  Assessment/Plan:   1. Type 2 diabetes mellitus with other specified complication, without long-term current use of insulin ([name]) We will check labs today, and we will refill metformin for 2 months. Reza will continue with Ozempic as is, and will continue to follow up as directed. Good blood sugar control is important to decrease the likelihood of diabetic complications such as nephropathy, neuropathy, limb loss, blindness, coronary artery disease, and death. Intensive lifestyle modification including diet, exercise and weight loss are the first line of treatment for diabetes.   - metFORMIN (GLUCOPHAGE) 500 MG tablet; Take 1 tablet (500 mg total) by mouth daily.   Dispense: 30 tablet; Refill: 1 - Comprehensive metabolic panel - Hemoglobin A1c - Insulin, random  2. Vitamin D deficiency Low Vitamin D level contributes to fatigue and are associated with obesity, breast, and colon cancer. We will check labs today. Moriah will follow-up for routine testing of Vitamin D, at least 2-3 times per year to avoid over-replacement.  - VITAMIN D 25 Hydroxy (Vit-D Deficiency, Fractures)  3. Other hyperlipidemia Cardiovascular risk and specific lipid/LDL goals reviewed. We discussed several lifestyle modifications today. We will check labs today. [name] will continue to work on diet, exercise and weight loss efforts. Orders and follow up as documented in patient record.   Counseling Intensive lifestyle modifications are the first line treatment for this issue. . Dietary changes: Increase soluble fiber. Decrease simple carbohydrates. . Exercise changes: Moderate to vigorous-intensity aerobic activity 150 minutes per week if tolerated. . Lipid-lowering medications: see documented in medical record.  - Lipid panel  4. At risk for hyperglycemia [name] was given approximately 15 minutes of counseling today regarding prevention of hyperglycemia. He was advised of hyperglycemia causes and the fact hyperglycemia is often asymptomatic. [name] was instructed to avoid skipping meals, eat regular protein rich meals and schedule low calorie but protein rich snacks as needed.   Repetitive spaced learning was employed today to elicit superior memory formation and behavioral change  5. Class 2 severe obesity with serious comorbidity and body mass index (BMI) of 38.0 to 38.9 in adult, unspecified obesity type [facility]) [name] is currently in the action stage of change. As such, his goal  is to continue with weight loss efforts. He has agreed to the Category 4 Plan.   Exercise goals: No exercise has been prescribed at this time.  Behavioral modification strategies: increasing lean protein  intake and meal planning and cooking strategies.  [name] has agreed to follow-up with our clinic in 3 weeks. He was informed of the importance of frequent follow-up visits to maximize his success with intensive lifestyle modifications for his multiple health conditions.   [name] was informed we would discuss his lab results at his next visit unless there is a critical issue that needs to be addressed sooner. [name] agreed to keep his next visit at the agreed upon time to discuss these results.  Objective:   Blood pressure 120/79, pulse (!) 128, temperature 98.5 F (36.9 C), height 5\' 11"  (1.803 m), weight 268 lb (121.6 kg), SpO2 96 %. Body mass index is 37.38 kg/m.  General: Cooperative, alert, well developed, in no acute distress. HEENT: Conjunctivae and lids unremarkable. Cardiovascular: Regular rhythm.  Lungs: Normal work of breathing. Neurologic: No focal deficits.   Lab Results  Component Value Date   CREATININE 0.94 06/05/2020   BUN 14 06/05/2020   NA 140 06/05/2020   K 4.4 06/05/2020   CL 105 06/05/2020   CO2 28 06/05/2020   Lab Results  Component Value Date   ALT 32 06/05/2020   AST 21 06/05/2020   ALKPHOS 97 06/05/2020   BILITOT 0.5 06/05/2020   Lab Results  Component Value Date   HGBA1C 8.2 (H) 03/14/2020   HGBA1C 6.2 (H) 08/10/2018   Lab Results  Component Value Date   INSULIN 28.0 (H) 03/14/2020   INSULIN 24.6 08/10/2018   Lab Results  Component Value Date   TSH 2.960 03/14/2020   Lab Results  Component Value Date   CHOL 166 03/14/2020   HDL 39 (L) 03/14/2020   LDLCALC 99 03/14/2020   TRIG 160 (H) 03/14/2020   Lab Results  Component Value Date   WBC 5.5 06/05/2020   HGB 11.2 (L) 06/05/2020   HCT 32.5 (L) 06/05/2020   MCV 89.3 06/05/2020   PLT 270 06/05/2020   No results found for: IRON, TIBC, FERRITIN  Attestation Statements:   Reviewed by clinician on day of visit: allergies, medications, problem list, medical history, surgical history,  family history, social history, and previous encounter notes.   Wilhemena Durie, am acting as transcriptionist for [employer], PA-C.  I have reviewed the above documentation for accuracy and completeness, and I agree with the above. Abby Potash, PA-C

## 2020-06-24 ENCOUNTER — Encounter (INDEPENDENT_AMBULATORY_CARE_PROVIDER_SITE_OTHER): Payer: Self-pay | Admitting: Physician Assistant

## 2020-06-24 ENCOUNTER — Other Ambulatory Visit (INDEPENDENT_AMBULATORY_CARE_PROVIDER_SITE_OTHER): Payer: Self-pay | Admitting: Physician Assistant

## 2020-06-24 DIAGNOSIS — E1169 Type 2 diabetes mellitus with other specified complication: Secondary | ICD-10-CM

## 2020-06-24 MED ORDER — METFORMIN HCL 500 MG PO TABS: 500.0000 mg | ORAL_TABLET | Freq: Every day | ORAL | 0 refills | Status: DC

## 2020-06-24 NOTE — Telephone Encounter (Signed)
Pt needs 90 day rx

## 2020-06-24 NOTE — Telephone Encounter (Signed)
Sent as a 90 day. Thanks

## 2020-06-25 DIAGNOSIS — E1169 Type 2 diabetes mellitus with other specified complication: Secondary | ICD-10-CM | POA: Diagnosis not present

## 2020-06-25 DIAGNOSIS — E7849 Other hyperlipidemia: Secondary | ICD-10-CM | POA: Diagnosis not present

## 2020-06-25 DIAGNOSIS — E559 Vitamin D deficiency, unspecified: Secondary | ICD-10-CM | POA: Diagnosis not present

## 2020-06-26 ENCOUNTER — Inpatient Hospital Stay: Payer: BC Managed Care – PPO

## 2020-06-26 ENCOUNTER — Encounter: Payer: Self-pay | Admitting: Hematology & Oncology

## 2020-06-26 ENCOUNTER — Other Ambulatory Visit: Payer: Self-pay

## 2020-06-26 ENCOUNTER — Inpatient Hospital Stay (HOSPITAL_BASED_OUTPATIENT_CLINIC_OR_DEPARTMENT_OTHER): Payer: BC Managed Care – PPO | Admitting: Hematology & Oncology

## 2020-06-26 VITALS — BP 114/64 | HR 97 | Resp 17

## 2020-06-26 VITALS — Wt 272.8 lb

## 2020-06-26 DIAGNOSIS — C8223 Follicular lymphoma grade III, unspecified, intra-abdominal lymph nodes: Secondary | ICD-10-CM | POA: Diagnosis not present

## 2020-06-26 DIAGNOSIS — Z5112 Encounter for antineoplastic immunotherapy: Secondary | ICD-10-CM | POA: Diagnosis not present

## 2020-06-26 DIAGNOSIS — Z79899 Other long term (current) drug therapy: Secondary | ICD-10-CM | POA: Diagnosis not present

## 2020-06-26 DIAGNOSIS — Z5111 Encounter for antineoplastic chemotherapy: Secondary | ICD-10-CM | POA: Diagnosis not present

## 2020-06-26 LAB — CMP (CANCER CENTER ONLY)
ALT: 30 U/L (ref 0–44)
AST: 20 U/L (ref 15–41)
Albumin: 4.1 g/dL (ref 3.5–5.0)
Alkaline Phosphatase: 99 U/L (ref 38–126)
Anion gap: 7 (ref 5–15)
BUN: 18 mg/dL (ref 6–20)
CO2: 27 mmol/L (ref 22–32)
Calcium: 9.6 mg/dL (ref 8.9–10.3)
Chloride: 105 mmol/L (ref 98–111)
Creatinine: 0.91 mg/dL (ref 0.61–1.24)
GFR, Estimated: >60 mL/min (ref >60)
Glucose, Bld: 143 mg/dL — ABNORMAL HIGH (ref 70–99)
Potassium: 4.1 mmol/L (ref 3.5–5.1)
Sodium: 139 mmol/L (ref 135–145)
Total Bilirubin: 0.4 mg/dL (ref 0.3–1.2)
Total Protein: 6.4 g/dL — ABNORMAL LOW (ref 6.5–8.1)

## 2020-06-26 LAB — CBC WITH DIFFERENTIAL (CANCER CENTER ONLY)
Abs Immature Granulocytes: 0.04 10*3/uL (ref 0.00–0.07)
Basophils Absolute: 0 10*3/uL (ref 0.0–0.1)
Basophils Relative: 1 %
Eosinophils Absolute: 0.1 10*3/uL (ref 0.0–0.5)
Eosinophils Relative: 1 %
HCT: 32.7 % — ABNORMAL LOW (ref 39.0–52.0)
Hemoglobin: 11 g/dL — ABNORMAL LOW (ref 13.0–17.0)
Immature Granulocytes: 1 %
Lymphocytes Relative: 8 %
Lymphs Abs: 0.4 10*3/uL — ABNORMAL LOW (ref 0.7–4.0)
MCH: 30.4 pg (ref 26.0–34.0)
MCHC: 33.6 g/dL (ref 30.0–36.0)
MCV: 90.3 fL (ref 80.0–100.0)
Monocytes Absolute: 0.8 10*3/uL (ref 0.1–1.0)
Monocytes Relative: 15 %
Neutro Abs: 3.9 10*3/uL (ref 1.7–7.7)
Neutrophils Relative %: 74 %
Platelet Count: 276 10*3/uL (ref 150–400)
RBC: 3.62 MIL/uL — ABNORMAL LOW (ref 4.22–5.81)
RDW: 14.7 % (ref 11.5–15.5)
WBC Count: 5.2 10*3/uL (ref 4.0–10.5)
nRBC: 0 % (ref 0.0–0.2)

## 2020-06-26 LAB — COMPREHENSIVE METABOLIC PANEL
ALT: 37 IU/L (ref 0–44)
AST: 24 IU/L (ref 0–40)
Albumin/Globulin Ratio: 2.1 (ref 1.2–2.2)
Albumin: 4.4 g/dL (ref 3.8–4.9)
Alkaline Phosphatase: 130 IU/L — ABNORMAL HIGH (ref 44–121)
BUN/Creatinine Ratio: 17 (ref 9–20)
BUN: 18 mg/dL (ref 6–24)
Bilirubin Total: 0.4 mg/dL (ref 0.0–1.2)
CO2: 22 mmol/L (ref 20–29)
Calcium: 9 mg/dL (ref 8.7–10.2)
Chloride: 102 mmol/L (ref 96–106)
Creatinine, Ser: 1.04 mg/dL (ref 0.76–1.27)
Globulin, Total: 2.1 g/dL (ref 1.5–4.5)
Glucose: 130 mg/dL — ABNORMAL HIGH (ref 65–99)
Potassium: 4.4 mmol/L (ref 3.5–5.2)
Sodium: 139 mmol/L (ref 134–144)
Total Protein: 6.5 g/dL (ref 6.0–8.5)
eGFR: 87 mL/min/{1.73_m2} (ref >59)

## 2020-06-26 LAB — LIPID PANEL
Chol/HDL Ratio: 4.5 ratio (ref 0.0–5.0)
Cholesterol, Total: 148 mg/dL (ref 100–199)
HDL: 33 mg/dL — ABNORMAL LOW (ref >39)
LDL Chol Calc (NIH): 86 mg/dL (ref 0–99)
Triglycerides: 169 mg/dL — ABNORMAL HIGH (ref 0–149)
VLDL Cholesterol Cal: 29 mg/dL (ref 5–40)

## 2020-06-26 LAB — HEMOGLOBIN A1C
Est. average glucose Bld gHb Est-mCnc: 157 mg/dL
Hgb A1c MFr Bld: 7.1 % — ABNORMAL HIGH (ref 4.8–5.6)

## 2020-06-26 LAB — LACTATE DEHYDROGENASE: LDH: 175 U/L (ref 98–192)

## 2020-06-26 LAB — INSULIN, RANDOM: INSULIN: 35.2 u[IU]/mL — ABNORMAL HIGH (ref 2.6–24.9)

## 2020-06-26 LAB — VITAMIN D 25 HYDROXY (VIT D DEFICIENCY, FRACTURES): Vit D, 25-Hydroxy: 42.9 ng/mL (ref 30.0–100.0)

## 2020-06-26 MED ORDER — SODIUM CHLORIDE 0.9 % IV SOLN
1000.0000 mg | Freq: Once | INTRAVENOUS | Status: AC
  Administered 2020-06-26: 1000 mg via INTRAVENOUS
  Filled 2020-06-26: qty 40

## 2020-06-26 MED ORDER — PALONOSETRON HCL INJECTION 0.25 MG/5ML
0.2500 mg | Freq: Once | INTRAVENOUS | Status: AC
  Administered 2020-06-26: 0.25 mg via INTRAVENOUS

## 2020-06-26 MED ORDER — DIPHENHYDRAMINE HCL 50 MG/ML IJ SOLN
12.5000 mg | Freq: Once | INTRAMUSCULAR | Status: AC
  Administered 2020-06-26: 12.5 mg via INTRAVENOUS

## 2020-06-26 MED ORDER — DOXORUBICIN HCL CHEMO IV INJECTION 2 MG/ML
50.0000 mg/m2 | Freq: Once | INTRAVENOUS | Status: AC
  Administered 2020-06-26: 126 mg via INTRAVENOUS
  Filled 2020-06-26: qty 63

## 2020-06-26 MED ORDER — SODIUM CHLORIDE 0.9 % IV SOLN
150.0000 mg | Freq: Once | INTRAVENOUS | Status: AC
  Administered 2020-06-26: 150 mg via INTRAVENOUS
  Filled 2020-06-26: qty 150

## 2020-06-26 MED ORDER — SODIUM CHLORIDE 0.9 % IV SOLN
750.0000 mg/m2 | Freq: Once | INTRAVENOUS | Status: AC
  Administered 2020-06-26: 1900 mg via INTRAVENOUS
  Filled 2020-06-26: qty 35

## 2020-06-26 MED ORDER — SODIUM CHLORIDE 0.9 % IV SOLN
Freq: Once | INTRAVENOUS | Status: AC
  Filled 2020-06-26: qty 250

## 2020-06-26 MED ORDER — SODIUM CHLORIDE 0.9 % IV SOLN
40.0000 mg | Freq: Once | INTRAVENOUS | Status: AC
  Administered 2020-06-26: 40 mg via INTRAVENOUS
  Filled 2020-06-26: qty 4

## 2020-06-26 MED ORDER — SODIUM CHLORIDE 0.9% FLUSH
10.0000 mL | INTRAVENOUS | Status: DC | PRN
  Administered 2020-06-26: 10 mL
  Filled 2020-06-26: qty 10

## 2020-06-26 MED ORDER — VINCRISTINE SULFATE CHEMO INJECTION 1 MG/ML
1.5000 mg | Freq: Once | INTRAVENOUS | Status: AC
  Administered 2020-06-26: 1.5 mg via INTRAVENOUS
  Filled 2020-06-26: qty 1.5

## 2020-06-26 MED ORDER — PALONOSETRON HCL INJECTION 0.25 MG/5ML
INTRAVENOUS | Status: AC
  Filled 2020-06-26: qty 5

## 2020-06-26 MED ORDER — HEPARIN SOD (PORK) LOCK FLUSH 100 UNIT/ML IV SOLN
500.0000 [IU] | Freq: Once | INTRAVENOUS | Status: AC | PRN
  Administered 2020-06-26: 500 [IU]
  Filled 2020-06-26: qty 5

## 2020-06-26 MED ORDER — SODIUM CHLORIDE 0.9 % IV SOLN
10.0000 mg | Freq: Once | INTRAVENOUS | Status: AC
  Administered 2020-06-26: 10 mg via INTRAVENOUS
  Filled 2020-06-26: qty 10

## 2020-06-26 MED ORDER — DIPHENHYDRAMINE HCL 50 MG/ML IJ SOLN
INTRAMUSCULAR | Status: AC
  Filled 2020-06-26: qty 1

## 2020-06-26 MED ORDER — ACETAMINOPHEN 325 MG PO TABS
650.0000 mg | ORAL_TABLET | Freq: Once | ORAL | Status: AC
  Administered 2020-06-26: 650 mg via ORAL

## 2020-06-26 MED ORDER — ACETAMINOPHEN 325 MG PO TABS
ORAL_TABLET | ORAL | Status: AC
  Filled 2020-06-26: qty 2

## 2020-06-26 NOTE — Progress Notes (Signed)
PA okay for [place] today per Otilio Carpen, [employer].

## 2020-06-26 NOTE — Progress Notes (Signed)
Hematology and Oncology Follow Up Visit  James Jennings [id number] 20-Apr-1970 50 y.o. 06/26/2020   Principle Diagnosis:  Follicular B- cell non-Hodgkin's lymphoma - Relapsed  Past Therapy:             Rituxan/bendamustine-s/p cycle 6 - completed on 07/03/2016  Current Therapy:  Gazyva  - Cytoxan/ Vincristine/Prednisone  - started 11/07/2019, s/p cycle #4 --  D/c on 03/26/2020 G-[facility] -- s/p cycle #4 -- started on 04/03/2020    Interim History:  James Jennings is here today for follow-up.  So far, he is done quite well with treatment.  He has had some nausea.  He has good antiemetics at home that seem to help.  He has had no mouth sores.  He has had no problems with cough or shortness of breath.  He does have diabetes.  The prednisone clearly makes his blood sugars go higher.  He has had no problems with rashes.  He has had a little bit of leg swelling, mostly in the left leg.  This is more chronic.  He has had no urinary difficulties.  He has had no fever.  He has had no headache.  He has lost all of his hair.  Overall, his performance status is ECOG 1.   Medications:  Allergies as of 06/26/2020      Reactions   Mango Flavor Swelling, Other (See Comments)   LIPS SWELL   Shellfish Allergy Anaphylaxis   Lactose Intolerance (gi) Diarrhea   Rituximab Other (See Comments)      Medication List       Accurate as of Jun 26, 2020  8:22 AM. If you have any questions, ask your nurse or doctor.        acetaminophen 325 MG tablet Commonly known as: TYLENOL Take 650 mg by mouth every 6 (six) hours as needed for moderate pain or headache.   atorvastatin 80 MG tablet Commonly known as: LIPITOR TAKE 1 TABLET BY MOUTH EVERY DAY   baclofen 10 MG tablet Commonly known as: LIORESAL Take 1 tablet (10 mg total) by mouth 3 (three) times daily.   cetirizine 10 MG tablet Commonly known as: ZYRTEC Take 10 mg by mouth daily as needed for allergies.   dexamethasone 4 MG tablet Commonly known  as: DECADRON Take 12 mg by mouth daily. 06/26/2020 Takes 12mg  starting the day of chemo x 5 days.   ibuprofen 200 MG tablet Commonly known as: ADVIL Take 600 mg by mouth every 8 (eight) hours as needed for mild pain or moderate pain.   lidocaine-prilocaine cream Commonly known as: EMLA Apply 1 application topically as needed. Apply to [facility] 1 hour prior to procedure.   LORazepam 0.5 MG tablet Commonly known as: ATIVAN Take 1 tablet (0.5 mg total) by mouth every 6 (six) hours as needed for anxiety.   metFORMIN 500 MG tablet Commonly known as: GLUCOPHAGE Take 1 tablet (500 mg total) by mouth daily.   metoCLOPramide 10 MG tablet Commonly known as: REGLAN Take 1 tablet (10 mg total) by mouth every 6 (six) hours as needed for nausea.   montelukast 10 MG tablet Commonly known as: SINGULAIR TAKE 1 TABLET BY MOUTH AT BEDTIME. START 3 DAYS BEFORE CHEMO AND TAKE FOR 30 DAYS What changed: See the new instructions.   multivitamin Tabs tablet Take 1 tablet by mouth daily.   ondansetron 8 MG tablet Commonly known as: ZOFRAN Take 1 tablet (8 mg total) by mouth every 8 (eight) hours as needed for nausea or vomiting.  Ozempic (0.25 or 0.5 MG/DOSE) 2 MG/1.5ML Sopn Generic drug: Semaglutide(0.25 or 0.5MG /DOS) INJECT 0.25MG  INTO THE SKIN ONE TIME PER WEEK What changed: See the new instructions.   prochlorperazine 10 MG tablet Commonly known as: COMPAZINE Take 1 tablet (10 mg total) by mouth every 6 (six) hours as needed for nausea or vomiting.       Allergies:  Allergies  Allergen Reactions  . Mango Flavor Swelling and Other (See Comments)    LIPS SWELL  . Shellfish Allergy Anaphylaxis  . Lactose Intolerance (Gi) Diarrhea  . Rituximab Other (See Comments)    Past Medical History, Surgical history, Social history, and Family History were reviewed and updated.  Review of Systems: Review of Systems  Constitutional: Negative.   HENT: Negative.   Eyes: Negative.   Respiratory:  Negative.   Cardiovascular: Negative.   Gastrointestinal: Negative.   Genitourinary: Negative.   Musculoskeletal: Negative.   Skin: Negative.   Neurological: Negative.   Endo/Heme/Allergies: Negative.   Psychiatric/Behavioral: Negative.      Physical Exam:  weight is 272 lb 12.8 oz (123.7 kg).   Wt Readings from Last 3 Encounters:  06/26/20 272 lb 12.8 oz (123.7 kg)  06/11/20 268 lb (121.6 kg)  06/05/20 274 lb (124.3 kg)    Physical Exam Vitals reviewed.  HENT:     Head: Normocephalic and atraumatic.  Eyes:     Pupils: Pupils are equal, round, and reactive to light.  Cardiovascular:     Rate and Rhythm: Normal rate and regular rhythm.     Heart sounds: Normal heart sounds.  Pulmonary:     Effort: Pulmonary effort is normal.     Breath sounds: Normal breath sounds.  Abdominal:     General: Bowel sounds are normal.     Palpations: Abdomen is soft.  Musculoskeletal:        General: No tenderness or deformity. Normal range of motion.     Cervical back: Normal range of motion.  Lymphadenopathy:     Cervical: No cervical adenopathy.  Skin:    General: Skin is warm and dry.     Findings: No erythema or rash.  Neurological:     Mental Status: He is alert and oriented to person, place, and time.  Psychiatric:        Behavior: Behavior normal.        Thought Content: Thought content normal.        Judgment: Judgment normal.      Lab Results  Component Value Date   WBC 5.2 06/26/2020   HGB 11.0 (L) 06/26/2020   HCT 32.7 (L) 06/26/2020   MCV 90.3 06/26/2020   PLT 276 06/26/2020   No results found for: FERRITIN, IRON, TIBC, UIBC, IRONPCTSAT Lab Results  Component Value Date   RETICCTPCT 1.07 12/06/2015   RBC 3.62 (L) 06/26/2020   RETICCTABS 48.79 12/06/2015   No results found for: Nils Pyle [facility] Lab Results  Component Value Date   IGGSERUM 785 06/04/2016   IGMSERUM 52 06/04/2016   Lab Results  Component Value Date   TOTALPROTELP  6.5 10/31/2019   ALBUMINELP 3.7 10/31/2019   A1GS 0.2 10/31/2019   A2GS 0.7 10/31/2019   BETS 1.1 10/31/2019   GAMS 0.8 10/31/2019   MSPIKE Not Observed 10/31/2019     Chemistry      Component Value Date/Time   NA 139 06/25/2020 0722   NA 141 12/03/2016 1155   NA 141 02/07/2016 1149   K 4.4 06/25/2020 0722   K  3.8 12/03/2016 1155   K 4.3 02/07/2016 1149   CL 102 06/25/2020 0722   CL 100 12/03/2016 1155   CO2 22 06/25/2020 0722   CO2 27 12/03/2016 1155   CO2 27 02/07/2016 1149   BUN 18 06/25/2020 0722   BUN 10 12/03/2016 1155   BUN 10.9 02/07/2016 1149   CREATININE 1.04 06/25/2020 0722   CREATININE 0.94 06/05/2020 0810   CREATININE 1.0 12/03/2016 1155   CREATININE 0.9 02/07/2016 1149      Component Value Date/Time   CALCIUM 9.0 06/25/2020 0722   CALCIUM 9.1 12/03/2016 1155   CALCIUM 9.6 02/07/2016 1149   ALKPHOS 130 (H) 06/25/2020 0722   ALKPHOS 99 (H) 12/03/2016 1155   ALKPHOS 127 02/07/2016 1149   AST 24 06/25/2020 0722   AST 21 06/05/2020 0810   AST 17 02/07/2016 1149   ALT 37 06/25/2020 0722   ALT 32 06/05/2020 0810   ALT 24 12/03/2016 1155   ALT 16 02/07/2016 1149   BILITOT 0.4 06/25/2020 0722   BILITOT 0.5 06/05/2020 0810   BILITOT 0.42 02/07/2016 1149       Impression and Plan: [name] a pleasant 50 yo caucasian gentleman with history of follicular large cell non-Hodgkin's lymphoma. He completed 6 cycles of chemotherapy with bendamustine on 07/03/2016 but was not tolerant of Rituxan (anaphylaxis). His lymphoma has now recurred.   We will go ahead with his 5th cycle of chemotherapy.  I think that with him doing so well, we will go with 6 cycles of treatment.    We will plan for another PET scan after he completes his 6 cycles of treatment.  I will plan to have him come back to see [place] in another 3 weeks.      [name], MD 5/25/20228:22 AM

## 2020-06-26 NOTE — Patient Instructions (Signed)
[facility]  Discharge Instructions: Thank you for choosing [facility] to provide your oncology and hematology care.   If you have a lab appointment with the [facility], please go directly to the [facility] and check in at the registration area.  Wear comfortable clothing and clothing appropriate for easy access to any Portacath or PICC line.   We strive to give you quality time with your provider. You may need to reschedule your appointment if you arrive late (15 or more minutes).  Arriving late affects you and other patients whose appointments are after yours.  Also, if you miss three or more appointments without notifying the office, you may be dismissed from the clinic at the provider's discretion.      For prescription refill requests, have your pharmacy contact our office and allow 72 hours for refills to be completed.    Today you received the following chemotherapy and/or immunotherapy agents ga    To help prevent nausea and vomiting after your treatment, we encourage you to take your nausea medication as directed. gazyva cytoxan,adriamycin vincristine  BELOW ARE SYMPTOMS THAT SHOULD BE REPORTED IMMEDIATELY: . *FEVER GREATER THAN 100.4 F (38 C) OR HIGHER . *CHILLS OR SWEATING . *NAUSEA AND VOMITING THAT IS NOT CONTROLLED WITH YOUR NAUSEA MEDICATION . *UNUSUAL SHORTNESS OF BREATH . *UNUSUAL BRUISING OR BLEEDING . *URINARY PROBLEMS (pain or burning when urinating, or frequent urination) . *BOWEL PROBLEMS (unusual diarrhea, constipation, pain near the anus) . TENDERNESS IN MOUTH AND THROAT WITH OR WITHOUT PRESENCE OF ULCERS (sore throat, sores in mouth, or a toothache) . UNUSUAL RASH, SWELLING OR PAIN  . UNUSUAL VAGINAL DISCHARGE OR ITCHING   Items with * indicate a potential emergency and should be followed up as soon as possible or go to the Emergency Department if any problems should occur.  Please show the CHEMOTHERAPY ALERT CARD  or IMMUNOTHERAPY ALERT CARD at check-in to the Emergency Department and triage nurse. Should you have questions after your visit or need to cancel or reschedule your appointment, please contact [facility]  [phone] and follow the prompts.  Office hours are 8:00 a.m. to 4:30 p.m. Monday - Friday. Please note that voicemails left after 4:00 p.m. may not be returned until the following business day.  We are closed weekends and major holidays. You have access to a nurse at all times for urgent questions. Please call the main number to the clinic [phone] and follow the prompts.  For any non-urgent questions, you may also contact your provider using MyChart. We now offer e-Visits for anyone 8 and older to request care online for non-urgent symptoms. For details visit [email or web address].   Also download the MyChart app! Go to the app store, search "MyChart", open the app, select [facility], and log in with your MyChart username and password.  Due to Covid, a mask is required upon entering the hospital/clinic. If you do not have a mask, one will be given to you upon arrival. For doctor visits, patients may have 1 support person aged 74 or older with them. For treatment visits, patients cannot have anyone with them due to current Covid guidelines and our immunocompromised population.

## 2020-06-26 NOTE — Patient Instructions (Signed)

## 2020-06-30 ENCOUNTER — Other Ambulatory Visit (INDEPENDENT_AMBULATORY_CARE_PROVIDER_SITE_OTHER): Payer: Self-pay | Admitting: Physician Assistant

## 2020-06-30 DIAGNOSIS — E1169 Type 2 diabetes mellitus with other specified complication: Secondary | ICD-10-CM

## 2020-07-03 ENCOUNTER — Other Ambulatory Visit: Payer: Self-pay

## 2020-07-03 ENCOUNTER — Encounter (INDEPENDENT_AMBULATORY_CARE_PROVIDER_SITE_OTHER): Payer: Self-pay | Admitting: Physician Assistant

## 2020-07-03 ENCOUNTER — Ambulatory Visit (INDEPENDENT_AMBULATORY_CARE_PROVIDER_SITE_OTHER): Payer: BC Managed Care – PPO | Admitting: Physician Assistant

## 2020-07-03 VITALS — BP 124/76 | HR 107 | Temp 97.9°F | Ht 71.0 in | Wt 269.0 lb

## 2020-07-03 DIAGNOSIS — Z6838 Body mass index (BMI) 38.0-38.9, adult: Secondary | ICD-10-CM | POA: Diagnosis not present

## 2020-07-03 DIAGNOSIS — E559 Vitamin D deficiency, unspecified: Secondary | ICD-10-CM | POA: Diagnosis not present

## 2020-07-03 DIAGNOSIS — Z9189 Other specified personal risk factors, not elsewhere classified: Secondary | ICD-10-CM | POA: Diagnosis not present

## 2020-07-03 DIAGNOSIS — E1169 Type 2 diabetes mellitus with other specified complication: Secondary | ICD-10-CM | POA: Diagnosis not present

## 2020-07-03 MED ORDER — OZEMPIC (0.25 OR 0.5 MG/DOSE) 2 MG/1.5ML ~~LOC~~ SOPN: 0.5000 mg | PEN_INJECTOR | SUBCUTANEOUS | 0 refills | Status: DC

## 2020-07-08 ENCOUNTER — Other Ambulatory Visit: Payer: Self-pay | Admitting: *Deleted

## 2020-07-08 MED ORDER — MONTELUKAST SODIUM 10 MG PO TABS: 10.0000 mg | ORAL_TABLET | Freq: Every day | ORAL | 3 refills | Status: DC

## 2020-07-08 NOTE — Progress Notes (Signed)
Chief Complaint:   OBESITY James Jennings is here to discuss his progress with his obesity treatment plan along with follow-up of his obesity related diagnoses. James Jennings is on the Category 4 Plan and states he is following his eating plan approximately 75-80% of the time. James Jennings states he is doing 0 minutes 0 times per week.  Today's visit was #: 7 Starting weight: 277 lbs Starting date: 03/14/2020 Today's weight: 269 lbs Today's date: 07/03/2020 Total lbs lost to date: 8 Total lbs lost since last in-office visit: 0  Interim History: James Jennings had chemo last week and he was nauseous much of the time. He is missing his snacks some days. He does well with breakfast and lunch, but he is only able to eat 4-5 oz of protein for dinner. He is eating out at least 3 nights a week.  Subjective:   1. Type 2 diabetes mellitus with other specified complication, without long-term current use of insulin ([name]) James Jennings's last A1c improved to 7.1. He is on James Jennings and metformin. I discussed labs with the patient today.  2. Vitamin D deficiency James Jennings's latest Vit D was not at goal. He is taking multivitamins daily. I discussed labs with the patient today.  3. At risk for heart disease James Jennings is at a higher than average risk for cardiovascular disease due to obesity.   Assessment/Plan:   1. Type 2 diabetes mellitus with other specified complication, without long-term current use of insulin ([place]) James Jennings will continue his medications, and we will refill James Jennings for 1 month. Good blood sugar control is important to decrease the likelihood of diabetic complications such as nephropathy, neuropathy, limb loss, blindness, coronary artery disease, and death. Intensive lifestyle modification including diet, exercise and weight loss are the first line of treatment for diabetes.   - Semaglutide,0.25 or 0.5MG /DOS, (James Jennings, 0.25 OR 0.5 MG/DOSE,) 2 MG/1.5ML SOPN; Inject 0.5 mg as directed once a week.  Dispense: 1.5 mL; Refill: 0  2.  Vitamin D deficiency Low Vitamin D level contributes to fatigue and are associated with obesity, breast, and colon cancer. James Jennings Jennings Vit D at home, but he is unsure of the units. He will advise [place] next visit. He will follow-up for routine testing of Vitamin D, at least 2-3 times per year to avoid over-replacement.  3. At risk for heart disease James Jennings was given approximately 15 minutes of coronary artery disease prevention counseling today. He is 50 y.o. male and Jennings risk factors for heart disease including obesity. We discussed intensive lifestyle modifications today with an emphasis on specific weight loss instructions and strategies.   Repetitive spaced learning was employed today to elicit superior memory formation and behavioral change.  4. Class 2 severe obesity with serious comorbidity and body mass index (BMI) of 38.0 to 38.9 in adult, unspecified obesity type James Jennings) James Jennings is currently in the action stage of change. As such, his goal is to continue with weight loss efforts. He Jennings agreed to the Category 4 Plan.   Exercise goals: No exercise Jennings been prescribed at this time.  Behavioral modification strategies: increasing lean protein intake, decreasing simple carbohydrates and decreasing eating out.  James Jennings agreed to follow-up with our clinic in 4 weeks. He was informed of the importance of frequent follow-up visits to maximize his success with intensive lifestyle modifications for his multiple health conditions.   Objective:   Blood pressure 124/76, pulse (!) 107, temperature 97.9 F (36.6 C), height 5\' 11"  (1.803 m), weight 269 lb (122 kg),  SpO2 95 %. Body mass index is 37.52 kg/m.  General: Cooperative, alert, well developed, in no acute distress. HEENT: Conjunctivae and lids unremarkable. Cardiovascular: Regular rhythm.  Lungs: Normal work of breathing. Neurologic: No focal deficits.   Lab Results  Component Value Date   CREATININE 0.91 06/26/2020   BUN 18 06/26/2020   NA  139 06/26/2020   K 4.1 06/26/2020   CL 105 06/26/2020   CO2 27 06/26/2020   Lab Results  Component Value Date   ALT 30 06/26/2020   AST 20 06/26/2020   ALKPHOS 99 06/26/2020   BILITOT 0.4 06/26/2020   Lab Results  Component Value Date   HGBA1C 7.1 (H) 06/25/2020   HGBA1C 8.2 (H) 03/14/2020   HGBA1C 6.2 (H) 08/10/2018   Lab Results  Component Value Date   INSULIN 35.2 (H) 06/25/2020   INSULIN 28.0 (H) 03/14/2020   INSULIN 24.6 08/10/2018   Lab Results  Component Value Date   TSH 2.960 03/14/2020   Lab Results  Component Value Date   CHOL 148 06/25/2020   HDL 33 (L) 06/25/2020   LDLCALC 86 06/25/2020   TRIG 169 (H) 06/25/2020   CHOLHDL 4.5 06/25/2020   Lab Results  Component Value Date   WBC 5.2 06/26/2020   HGB 11.0 (L) 06/26/2020   HCT 32.7 (L) 06/26/2020   MCV 90.3 06/26/2020   PLT 276 06/26/2020   No results found for: IRON, TIBC, FERRITIN  Attestation Statements:   Reviewed by clinician on day of visit: allergies, medications, problem list, medical history, surgical history, family history, social history, and previous encounter notes.   Wilhemena Durie, am acting as transcriptionist for [employer], PA-C.  I have reviewed the above documentation for accuracy and completeness, and I agree with the above. Abby Potash, PA-C

## 2020-07-16 ENCOUNTER — Inpatient Hospital Stay: Payer: BC Managed Care – PPO

## 2020-07-16 ENCOUNTER — Other Ambulatory Visit: Payer: Self-pay

## 2020-07-16 ENCOUNTER — Telehealth: Payer: Self-pay

## 2020-07-16 ENCOUNTER — Inpatient Hospital Stay: Payer: BC Managed Care – PPO | Attending: Hematology & Oncology

## 2020-07-16 ENCOUNTER — Other Ambulatory Visit: Payer: Self-pay | Admitting: *Deleted

## 2020-07-16 ENCOUNTER — Other Ambulatory Visit (HOSPITAL_COMMUNITY): Payer: Self-pay

## 2020-07-16 ENCOUNTER — Inpatient Hospital Stay (HOSPITAL_BASED_OUTPATIENT_CLINIC_OR_DEPARTMENT_OTHER): Payer: BC Managed Care – PPO | Admitting: Hematology & Oncology

## 2020-07-16 ENCOUNTER — Encounter: Payer: Self-pay | Admitting: Hematology & Oncology

## 2020-07-16 VITALS — BP 124/71 | HR 98 | Temp 97.9°F | Resp 18 | Wt 273.0 lb

## 2020-07-16 VITALS — BP 129/70 | HR 93 | Temp 98.1°F | Resp 18

## 2020-07-16 DIAGNOSIS — C8223 Follicular lymphoma grade III, unspecified, intra-abdominal lymph nodes: Secondary | ICD-10-CM

## 2020-07-16 DIAGNOSIS — Z79899 Other long term (current) drug therapy: Secondary | ICD-10-CM | POA: Insufficient documentation

## 2020-07-16 DIAGNOSIS — Z5111 Encounter for antineoplastic chemotherapy: Secondary | ICD-10-CM | POA: Diagnosis not present

## 2020-07-16 DIAGNOSIS — R110 Nausea: Secondary | ICD-10-CM | POA: Insufficient documentation

## 2020-07-16 DIAGNOSIS — Z5112 Encounter for antineoplastic immunotherapy: Secondary | ICD-10-CM | POA: Diagnosis not present

## 2020-07-16 LAB — CMP (CANCER CENTER ONLY)
ALT: 31 U/L (ref 0–44)
AST: 19 U/L (ref 15–41)
Albumin: 4 g/dL (ref 3.5–5.0)
Alkaline Phosphatase: 114 U/L (ref 38–126)
Anion gap: 8 (ref 5–15)
BUN: 16 mg/dL (ref 6–20)
CO2: 27 mmol/L (ref 22–32)
Calcium: 9.5 mg/dL (ref 8.9–10.3)
Chloride: 105 mmol/L (ref 98–111)
Creatinine: 0.96 mg/dL (ref 0.61–1.24)
GFR, Estimated: >60 mL/min (ref >60)
Glucose, Bld: 138 mg/dL — ABNORMAL HIGH (ref 70–99)
Potassium: 4 mmol/L (ref 3.5–5.1)
Sodium: 140 mmol/L (ref 135–145)
Total Bilirubin: 0.4 mg/dL (ref 0.3–1.2)
Total Protein: 6.2 g/dL — ABNORMAL LOW (ref 6.5–8.1)

## 2020-07-16 LAB — LACTATE DEHYDROGENASE: LDH: 199 U/L — ABNORMAL HIGH (ref 98–192)

## 2020-07-16 LAB — CBC WITH DIFFERENTIAL (CANCER CENTER ONLY)
Abs Immature Granulocytes: 0.05 10*3/uL (ref 0.00–0.07)
Basophils Absolute: 0 10*3/uL (ref 0.0–0.1)
Basophils Relative: 1 %
Eosinophils Absolute: 0.1 10*3/uL (ref 0.0–0.5)
Eosinophils Relative: 2 %
HCT: 31.8 % — ABNORMAL LOW (ref 39.0–52.0)
Hemoglobin: 10.8 g/dL — ABNORMAL LOW (ref 13.0–17.0)
Immature Granulocytes: 1 %
Lymphocytes Relative: 14 %
Lymphs Abs: 0.5 10*3/uL — ABNORMAL LOW (ref 0.7–4.0)
MCH: 31 pg (ref 26.0–34.0)
MCHC: 34 g/dL (ref 30.0–36.0)
MCV: 91.4 fL (ref 80.0–100.0)
Monocytes Absolute: 0.7 10*3/uL (ref 0.1–1.0)
Monocytes Relative: 18 %
Neutro Abs: 2.5 10*3/uL (ref 1.7–7.7)
Neutrophils Relative %: 64 %
Platelet Count: 273 10*3/uL (ref 150–400)
RBC: 3.48 MIL/uL — ABNORMAL LOW (ref 4.22–5.81)
RDW: 14.9 % (ref 11.5–15.5)
WBC Count: 3.8 10*3/uL — ABNORMAL LOW (ref 4.0–10.5)
nRBC: 0 % (ref 0.0–0.2)

## 2020-07-16 LAB — TSH: TSH: 2.603 u[IU]/mL (ref 0.320–4.118)

## 2020-07-16 MED ORDER — PALONOSETRON HCL INJECTION 0.25 MG/5ML
INTRAVENOUS | Status: AC
  Filled 2020-07-16: qty 5

## 2020-07-16 MED ORDER — SODIUM CHLORIDE 0.9 % IV SOLN
150.0000 mg | Freq: Once | INTRAVENOUS | Status: AC
  Administered 2020-07-16: 150 mg via INTRAVENOUS
  Filled 2020-07-16: qty 150

## 2020-07-16 MED ORDER — ACETAMINOPHEN 325 MG PO TABS
ORAL_TABLET | ORAL | Status: AC
  Filled 2020-07-16: qty 2

## 2020-07-16 MED ORDER — SODIUM CHLORIDE 0.9% FLUSH
10.0000 mL | INTRAVENOUS | Status: DC | PRN
Start: 2020-07-16 — End: 2020-07-16
  Administered 2020-07-16: 10 mL
  Filled 2020-07-16: qty 10

## 2020-07-16 MED ORDER — DIPHENHYDRAMINE HCL 50 MG/ML IJ SOLN
12.5000 mg | Freq: Once | INTRAMUSCULAR | Status: AC
  Administered 2020-07-16: 12.5 mg via INTRAVENOUS

## 2020-07-16 MED ORDER — DIPHENHYDRAMINE HCL 50 MG/ML IJ SOLN
INTRAMUSCULAR | Status: AC
  Filled 2020-07-16: qty 1

## 2020-07-16 MED ORDER — SODIUM CHLORIDE 0.9 % IV SOLN
Freq: Once | INTRAVENOUS | Status: AC
  Filled 2020-07-16: qty 250

## 2020-07-16 MED ORDER — PALONOSETRON HCL INJECTION 0.25 MG/5ML
0.2500 mg | Freq: Once | INTRAVENOUS | Status: AC
  Administered 2020-07-16: 0.25 mg via INTRAVENOUS

## 2020-07-16 MED ORDER — DOXORUBICIN HCL CHEMO IV INJECTION 2 MG/ML
50.0000 mg/m2 | Freq: Once | INTRAVENOUS | Status: AC
  Administered 2020-07-16: 126 mg via INTRAVENOUS
  Filled 2020-07-16: qty 63

## 2020-07-16 MED ORDER — LORAZEPAM 0.5 MG PO TABS: 0.5000 mg | ORAL_TABLET | Freq: Four times a day (QID) | ORAL | 0 refills | Status: DC | PRN

## 2020-07-16 MED ORDER — SODIUM CHLORIDE 0.9 % IV SOLN
1000.0000 mg | Freq: Once | INTRAVENOUS | Status: AC
  Administered 2020-07-16: 1000 mg via INTRAVENOUS
  Filled 2020-07-16: qty 40

## 2020-07-16 MED ORDER — SODIUM CHLORIDE 0.9 % IV SOLN
1.5000 mg | Freq: Once | INTRAVENOUS | Status: AC
  Administered 2020-07-16: 1.5 mg via INTRAVENOUS
  Filled 2020-07-16: qty 1.5

## 2020-07-16 MED ORDER — HEPARIN SOD (PORK) LOCK FLUSH 100 UNIT/ML IV SOLN
500.0000 [IU] | Freq: Once | INTRAVENOUS | Status: AC | PRN
Start: 2020-07-16 — End: 2020-07-16
  Administered 2020-07-16: 500 [IU]
  Filled 2020-07-16: qty 5

## 2020-07-16 MED ORDER — FAMOTIDINE 200 MG/20ML IV SOLN
40.0000 mg | Freq: Once | INTRAVENOUS | Status: AC
  Administered 2020-07-16: 40 mg via INTRAVENOUS
  Filled 2020-07-16: qty 4

## 2020-07-16 MED ORDER — SODIUM CHLORIDE 0.9 % IV SOLN
750.0000 mg/m2 | Freq: Once | INTRAVENOUS | Status: AC
  Administered 2020-07-16: 1900 mg via INTRAVENOUS
  Filled 2020-07-16: qty 95

## 2020-07-16 MED ORDER — ACETAMINOPHEN 325 MG PO TABS
650.0000 mg | ORAL_TABLET | Freq: Once | ORAL | Status: AC
  Administered 2020-07-16: 650 mg via ORAL

## 2020-07-16 MED ORDER — SODIUM CHLORIDE 0.9 % IV SOLN
10.0000 mg | Freq: Once | INTRAVENOUS | Status: AC
  Administered 2020-07-16: 10 mg via INTRAVENOUS
  Filled 2020-07-16: qty 10

## 2020-07-16 NOTE — Telephone Encounter (Signed)
Appts made per 07/16/20 los and pt to gain sch in chemo/avs   Ylonda Storr

## 2020-07-16 NOTE — Patient Instructions (Signed)
Implanted Port Home Guide An implanted port is a device that is placed under the skin. It is usually placed in the chest. The device can be used to give IV medicine, to take blood, or for dialysis. You may have an implanted port if: You need IV medicine that would be irritating to the small veins in your hands or arms. You need IV medicines, such as antibiotics, for a long period of time. You need IV nutrition for a long period of time. You need dialysis. When you have a port, your health care provider can choose to use the port instead of veins in your arms for these procedures. You may have fewer limitations when using a port than you would if you used other types of long-term IVs, and you will likely be able to return to normal activities afteryour incision heals. An implanted port has two main parts: Reservoir. The reservoir is the part where a needle is inserted to give medicines or draw blood. The reservoir is round. After it is placed, it appears as a small, raised area under your skin. Catheter. The catheter is a thin, flexible tube that connects the reservoir to a vein. Medicine that is inserted into the reservoir goes into the catheter and then into the vein. How is my port accessed? To access your port: A numbing cream may be placed on the skin over the port site. Your health care provider will put on a mask and sterile gloves. The skin over your port will be cleaned carefully with a germ-killing soap and allowed to dry. Your health care provider will gently pinch the port and insert a needle into it. Your health care provider will check for a blood return to make sure the port is in the vein and is not clogged. If your port needs to remain accessed to get medicine continuously (constant infusion), your health care provider will place a clear bandage (dressing) over the needle site. The dressing and needle will need to be changed every week, or as told by your health care provider. What  is flushing? Flushing helps keep the port from getting clogged. Follow instructions from your health care provider about how and when to flush the port. Ports are usually flushed with saline solution or a medicine called heparin. The need for flushing will depend on how the port is used: If the port is only used from time to time to give medicines or draw blood, the port may need to be flushed: Before and after medicines have been given. Before and after blood has been drawn. As part of routine maintenance. Flushing may be recommended every 4-6 weeks. If a constant infusion is running, the port may not need to be flushed. Throw away any syringes in a disposal container that is meant for sharp items (sharps container). You can buy a sharps container from a pharmacy, or you can make one by using an empty hard plastic bottle with a cover. How long will my port stay implanted? The port can stay in for as long as your health care provider thinks it is needed. When it is time for the port to come out, a surgery will be done to remove it. The surgery will be similar to the procedure that was done to putthe port in. Follow these instructions at home:  Flush your port as told by your health care provider. If you need an infusion over several days, follow instructions from your health care provider about how to take   care of your port site. Make sure you: Wash your hands with soap and water before you change your dressing. If soap and water are not available, use alcohol-based hand sanitizer. Change your dressing as told by your health care provider. Place any used dressings or infusion bags into a plastic bag. Throw that bag in the trash. Keep the dressing that covers the needle clean and dry. Do not get it wet. Do not use scissors or sharp objects near the tube. Keep the tube clamped, unless it is being used. Check your port site every day for signs of infection. Check for: Redness, swelling, or  pain. Fluid or blood. Pus or a bad smell. Protect the skin around the port site. Avoid wearing bra straps that rub or irritate the site. Protect the skin around your port from seat belts. Place a soft pad over your chest if needed. Bathe or shower as told by your health care provider. The site may get wet as long as you are not actively receiving an infusion. Return to your normal activities as told by your health care provider. Ask your health care provider what activities are safe for you. Carry a medical alert card or wear a medical alert bracelet at all times. This will let health care providers know that you have an implanted port in case of an emergency. Get help right away if: You have redness, swelling, or pain at the port site. You have fluid or blood coming from your port site. You have pus or a bad smell coming from the port site. You have a fever. Summary Implanted ports are usually placed in the chest for long-term IV access. Follow instructions from your health care provider about flushing the port and changing bandages (dressings). Take care of the area around your port by avoiding clothing that puts pressure on the area, and by watching for signs of infection. Protect the skin around your port from seat belts. Place a soft pad over your chest if needed. Get help right away if you have a fever or you have redness, swelling, pain, drainage, or a bad smell at the port site. This information is not intended to replace advice given to you by your health care provider. Make sure you discuss any questions you have with your healthcare provider. Document Revised: 06/05/2019 Document Reviewed: 06/05/2019 Elsevier Patient Education  2022 Elsevier Inc.  

## 2020-07-16 NOTE — Progress Notes (Signed)
Hematology and Oncology Follow Up Visit  James Jennings [id number] [birth year] 50 y.o. 07/16/2020   Principle Diagnosis:  Follicular B- cell non-Hodgkin's lymphoma - Relapsed   Past Therapy:             Rituxan/bendamustine-s/p cycle 6 - completed on 07/03/2016   Current Therapy:  Gazyva  - Cytoxan/ Vincristine/Prednisone  - started 11/07/2019, s/p cycle #4 --  D/c on 03/26/2020 [facility] -- s/p cycle #5 -- started on 04/03/2020    Interim History:  [name] is here today for follow-up.  This will be his last cycle of chemotherapy.  He said the last cycle was a bit rough on him.  He just had a lot of nausea.  I think he just has 1 way fatigue and weakness.  I Minna check a testosterone level on him.  I will also check a TSH.  His blood sugars have been doing fairly well.  He is trying to watch what he eats.  He has had no problems with diarrhea or constipation.  He has had no urinary issues.  He has some congestion.  He has had some rhinorrhea.  He says that he does have a little bit of a temperature in the evening.  This seems to be after he works.  He has had no sweats with it.  He has had no rash with it.  He has had no cough.  He has had no swollen lymph nodes.  There is been no abdominal pain.  Overall, his performance status is ECOG 1.    Medications:  Allergies as of 07/16/2020       Reactions   Mango Flavor Swelling, Other (See Comments)   LIPS SWELL   Shellfish Allergy Anaphylaxis   Lactose Intolerance (gi) Diarrhea   Rituximab Other (See Comments)        Medication List        Accurate as of July 16, 2020  8:34 AM. If you have any questions, ask your nurse or doctor.          acetaminophen 325 MG tablet Commonly known as: TYLENOL Take 650 mg by mouth every 6 (six) hours as needed for moderate pain or headache.   atorvastatin 80 MG tablet Commonly known as: LIPITOR TAKE 1 TABLET BY MOUTH EVERY DAY   baclofen 10 MG tablet Commonly known as: LIORESAL Take  1 tablet (10 mg total) by mouth 3 (three) times daily.   cetirizine 10 MG tablet Commonly known as: ZYRTEC Take 10 mg by mouth daily as needed for allergies.   dexamethasone 4 MG tablet Commonly known as: DECADRON Take 12 mg by mouth daily. 06/26/2020 Takes 12mg  starting the day of chemo x 5 days.   ibuprofen 200 MG tablet Commonly known as: ADVIL Take 600 mg by mouth every 8 (eight) hours as needed for mild pain or moderate pain.   lidocaine-prilocaine cream Commonly known as: EMLA Apply 1 application topically as needed. Apply to [facility] 1 hour prior to procedure.   LORazepam 0.5 MG tablet Commonly known as: ATIVAN Take 1 tablet (0.5 mg total) by mouth every 6 (six) hours as needed for anxiety.   metFORMIN 500 MG tablet Commonly known as: GLUCOPHAGE Take 1 tablet (500 mg total) by mouth daily.   metoCLOPramide 10 MG tablet Commonly known as: REGLAN Take 1 tablet (10 mg total) by mouth every 6 (six) hours as needed for nausea.   montelukast 10 MG tablet Commonly known as: SINGULAIR Take 1 tablet (10 mg total)  by mouth at bedtime.   multivitamin Tabs tablet Take 1 tablet by mouth daily.   ondansetron 8 MG tablet Commonly known as: ZOFRAN Take 1 tablet (8 mg total) by mouth every 8 (eight) hours as needed for nausea or vomiting.   Ozempic (0.25 or 0.5 MG/DOSE) 2 MG/1.5ML Sopn Generic drug: Semaglutide(0.25 or 0.5MG /DOS) Inject 0.5 mg as directed once a week.   prochlorperazine 10 MG tablet Commonly known as: COMPAZINE Take 1 tablet (10 mg total) by mouth every 6 (six) hours as needed for nausea or vomiting.        Allergies:  Allergies  Allergen Reactions   Mango Flavor Swelling and Other (See Comments)    LIPS SWELL   Shellfish Allergy Anaphylaxis   Lactose Intolerance (Gi) Diarrhea   Rituximab Other (See Comments)    Past Medical History, Surgical history, Social history, and Family History were reviewed and updated.  Review of Systems: Review of Systems   Constitutional: Negative.   HENT: Negative.    Eyes: Negative.   Respiratory: Negative.    Cardiovascular: Negative.   Gastrointestinal: Negative.   Genitourinary: Negative.   Musculoskeletal: Negative.   Skin: Negative.   Neurological: Negative.   Endo/Heme/Allergies: Negative.   Psychiatric/Behavioral: Negative.      Physical Exam:  weight is 273 lb (123.8 kg). His oral temperature is 97.9 F (36.6 C). His blood pressure is 124/71 and his pulse is 98. His respiration is 18 and oxygen saturation is 97%.   Wt Readings from Last 3 Encounters:  07/16/20 273 lb (123.8 kg)  07/03/20 269 lb (122 kg)  06/26/20 272 lb 12.8 oz (123.7 kg)    Physical Exam Vitals reviewed.  HENT:     Head: Normocephalic and atraumatic.  Eyes:     Pupils: Pupils are equal, round, and reactive to light.  Cardiovascular:     Rate and Rhythm: Normal rate and regular rhythm.     Heart sounds: Normal heart sounds.  Pulmonary:     Effort: Pulmonary effort is normal.     Breath sounds: Normal breath sounds.  Abdominal:     General: Bowel sounds are normal.     Palpations: Abdomen is soft.  Musculoskeletal:        General: No tenderness or deformity. Normal range of motion.     Cervical back: Normal range of motion.  Lymphadenopathy:     Cervical: No cervical adenopathy.  Skin:    General: Skin is warm and dry.     Findings: No erythema or rash.  Neurological:     Mental Status: He is alert and oriented to person, place, and time.  Psychiatric:        Behavior: Behavior normal.        Thought Content: Thought content normal.        Judgment: Judgment normal.     Lab Results  Component Value Date   WBC 3.8 (L) 07/16/2020   HGB 10.8 (L) 07/16/2020   HCT 31.8 (L) 07/16/2020   MCV 91.4 07/16/2020   PLT 273 07/16/2020   No results found for: FERRITIN, IRON, TIBC, UIBC, IRONPCTSAT Lab Results  Component Value Date   RETICCTPCT 1.07 12/06/2015   RBC 3.48 (L) 07/16/2020   RETICCTABS 48.79  12/06/2015   No results found for: [facility] Lab Results  Component Value Date   IGGSERUM 785 06/04/2016   IGMSERUM 52 06/04/2016   Lab Results  Component Value Date   TOTALPROTELP 6.5 10/31/2019   ALBUMINELP 3.7 10/31/2019  A1GS 0.2 10/31/2019   A2GS 0.7 10/31/2019   BETS 1.1 10/31/2019   GAMS 0.8 10/31/2019   MSPIKE Not Observed 10/31/2019     Chemistry      Component Value Date/Time   NA 139 06/26/2020 0815   NA 139 06/25/2020 0722   NA 141 12/03/2016 1155   NA 141 02/07/2016 1149   K 4.1 06/26/2020 0815   K 3.8 12/03/2016 1155   K 4.3 02/07/2016 1149   CL 105 06/26/2020 0815   CL 100 12/03/2016 1155   CO2 27 06/26/2020 0815   CO2 27 12/03/2016 1155   CO2 27 02/07/2016 1149   BUN 18 06/26/2020 0815   BUN 18 06/25/2020 0722   BUN 10 12/03/2016 1155   BUN 10.9 02/07/2016 1149   CREATININE 0.91 06/26/2020 0815   CREATININE 1.0 12/03/2016 1155   CREATININE 0.9 02/07/2016 1149      Component Value Date/Time   CALCIUM 9.6 06/26/2020 0815   CALCIUM 9.1 12/03/2016 1155   CALCIUM 9.6 02/07/2016 1149   ALKPHOS 99 06/26/2020 0815   ALKPHOS 99 (H) 12/03/2016 1155   ALKPHOS 127 02/07/2016 1149   AST 20 06/26/2020 0815   AST 17 02/07/2016 1149   ALT 30 06/26/2020 0815   ALT 24 12/03/2016 1155   ALT 16 02/07/2016 1149   BILITOT 0.4 06/26/2020 0815   BILITOT 0.42 02/07/2016 1149       Impression and Plan: Mr. Minnehan is a pleasant 50 yo caucasian gentleman with history of follicular large cell non-Hodgkin's lymphoma. He completed 6 cycles of chemotherapy with bendamustine on 07/03/2016 but was not tolerant of Rituxan (anaphylaxis). His lymphoma has now recurred.   We will go ahead with his 6th, and final cycle of chemotherapy.    I will set him up with a PET scan in about a month.  I think this would be reasonable.  I think he will need to have some kind of maintenance therapy.  We will plan to get him on Gazyva as a maintenance every 2  months.  I will plan to see him back in about 6 weeks.        [name], MD 6/14/20228:34 AM

## 2020-07-16 NOTE — Patient Instructions (Signed)
Obinutuzumab injection What is this medication? OBINUTUZUMAB (OH bi nue TOOZ ue mab) is a monoclonal antibody. It is used to treat chronic lymphocytic leukemia (CLL) and a type of non-Hodgkin lymphoma(NHL), follicular lymphoma. This medicine may be used for other purposes; ask your health care provider orpharmacist if you have questions. COMMON BRAND NAME(S): GAZYVA What should I tell my care team before I take this medication? They need to know if you have any of these conditions: infection (especially a virus infection such as hepatitis B virus) lung or breathing disease heart disease take medicines that treat or prevent blood clots an unusual or allergic reaction to obinutuzumab, other medicines, foods, dyes, or preservatives pregnant or trying to get pregnant breast-feeding How should I use this medication? This medicine is for infusion into a vein. It is given by a health careprofessional in a hospital or clinic setting. Talk to your pediatrician regarding the use of this medicine in children.Special care may be needed. Overdosage: If you think you have taken too much of this medicine contact apoison control center or emergency room at once. NOTE: This medicine is only for you. Do not share this medicine with others. What if I miss a dose? Keep appointments for follow-up doses as directed. It is important not to miss your dose. Call your doctor or health care professional if you are unable tokeep an appointment. What may interact with this medication? live virus vaccines This list may not describe all possible interactions. Give your health care provider a list of all the medicines, herbs, non-prescription drugs, or dietary supplements you use. Also tell them if you smoke, drink alcohol, or use illegaldrugs. Some items may interact with your medicine. What should I watch for while using this medication? Report any side effects that you notice during your treatment right away, such as  changes in your breathing, fever, chills, dizziness or lightheadedness.These effects are more common with the first dose. Visit your prescriber or health care professional for checks on your progress. You will need to have regular blood work. Report any other side effects. The side effects of this medicine can continue after you finish your treatment. Continue your course of treatment even though you feel ill unless your doctortells you to stop. Call your doctor or health care professional for advice if you get a fever, chills or sore throat, or other symptoms of a cold or flu. Do not treat yourself. This drug decreases your body's ability to fight infections. Try toavoid being around people who are sick. This medicine may increase your risk to bruise or bleed. Call your doctor orhealth care professional if you notice any unusual bleeding. Do not become pregnant while taking this medicine or for 6 months after stopping it. Women should inform their doctor if they wish to become pregnant or think they might be pregnant. There is a potential for serious side effects to an unborn child. Talk to your health care professional or pharmacist for more information. Do not breast-feed an infant while taking this medicine orfor 6 months after stopping it. What side effects may I notice from receiving this medication? Side effects that you should report to your doctor or health care professionalas soon as possible: allergic reactions like skin rash, itching or hives, swelling of the face, lips, or tongue breathing problems changes in vision chest pain or chest tightness confusion dizziness loss of balance or coordination low blood counts - this medicine may decrease the number of white blood cells, red blood cells and  platelets. You may be at increased risk for infections and bleeding. signs of decreased platelets or bleeding - bruising, pinpoint red spots on the skin, black, tarry stools, blood in the  urine signs of infection - fever or chills, cough, sore throat, pain or trouble passing urine signs and symptoms of liver injury like dark yellow or brown urine; general ill feeling or flu-like symptoms; light-colored stools; loss of appetite; nausea; right upper belly pain; unusually weak or tired; yellowing of the eyes or skin trouble speaking or understanding trouble walking vomiting Side effects that usually do not require medical attention (report to yourdoctor or health care professional if they continue or are bothersome): constipation joint pain muscle pain This list may not describe all possible side effects. Call your doctor for medical advice about side effects. You may report side effects to FDA at1-800-FDA-1088. Where should I keep my medication? This drug is only given in a hospital or clinic and will not be stored at home. NOTE: This sheet is a summary. It may not cover all possible information. If you have questions about this medicine, talk to your doctor, pharmacist, orhealth care provider.  2022 Elsevier/Gold Standard (2018-05-03 15:34:53) Doxorubicin injection What is this medication? DOXORUBICIN (dox oh ROO bi sin) is a chemotherapy drug. It is used to treat many kinds of cancer like leukemia, lymphoma, neuroblastoma, sarcoma, and Wilms' tumor. It is also used to treat bladder cancer, breast cancer, lungcancer, ovarian cancer, stomach cancer, and thyroid cancer. This medicine may be used for other purposes; ask your health care provider orpharmacist if you have questions. COMMON BRAND NAME(S): Adriamycin, Adriamycin PFS, Adriamycin RDF, Rubex What should I tell my care team before I take this medication? They need to know if you have any of these conditions: heart disease history of low blood counts caused by a medicine liver disease recent or ongoing radiation therapy an unusual or allergic reaction to doxorubicin, other chemotherapy agents, other medicines, foods,  dyes, or preservatives pregnant or trying to get pregnant breast-feeding How should I use this medication? This drug is given as an infusion into a vein. It is administered in a hospital or clinic by a specially trained health care professional. If you have pain, swelling, burning or any unusual feeling around the site of your injection,tell your health care professional right away. Talk to your pediatrician regarding the use of this medicine in children.Special care may be needed. Overdosage: If you think you have taken too much of this medicine contact apoison control center or emergency room at once. NOTE: This medicine is only for you. Do not share this medicine with others. What if I miss a dose? It is important not to miss your dose. Call your doctor or health careprofessional if you are unable to keep an appointment. What may interact with this medication? This medicine may interact with the following medications: 6-mercaptopurine paclitaxel phenytoin [facility] John's Wort trastuzumab verapamil This list may not describe all possible interactions. Give your health care provider a list of all the medicines, herbs, non-prescription drugs, or dietary supplements you use. Also tell them if you smoke, drink alcohol, or use illegaldrugs. Some items may interact with your medicine. What should I watch for while using this medication? This drug may make you feel generally unwell. This is not uncommon, as chemotherapy can affect healthy cells as well as cancer cells. Report any side effects. Continue your course of treatment even though you feel ill unless yourdoctor tells you to stop. There is a  maximum amount of this medicine you should receive throughout your life. The amount depends on the medical condition being treated and your overall health. Your doctor will watch how much of this medicine you receive inyour lifetime. Tell your doctor if you have taken this medicine before. You may need blood  work done while you are taking this medicine. Your urine may turn red for a few days after your dose. This is not blood. Ifyour urine is dark or brown, call your doctor. In some cases, you may be given additional medicines to help with side effects.Follow all directions for their use. Call your doctor or health care professional for advice if you get a fever, chills or sore throat, or other symptoms of a cold or flu. Do not treat yourself. This drug decreases your body's ability to fight infections. Try toavoid being around people who are sick. This medicine may increase your risk to bruise or bleed. Call your doctor orhealth care professional if you notice any unusual bleeding. Talk to your doctor about your risk of cancer. You may be more at risk forcertain types of cancers if you take this medicine. Do not become pregnant while taking this medicine or for 6 months after stopping it. Women should inform their doctor if they wish to become pregnant or think they might be pregnant. Men should not father a child while taking this medicine and for 6 months after stopping it. There is a potential for serious side effects to an unborn child. Talk to your health care professional or pharmacist for more information. Do not breast-feed an infant while takingthis medicine. This medicine has caused ovarian failure in some women and reduced sperm counts in some men This medicine may interfere with the ability to have a child. Talk with your doctor or health care professional if you are concerned about yourfertility. This medicine may cause a decrease in Co-Enzyme Q-10. You should make sure that you get enough Co-Enzyme Q-10 while you are taking this medicine. Discuss thefoods you eat and the vitamins you take with your health care professional. What side effects may I notice from receiving this medication? Side effects that you should report to your doctor or health care professionalas soon as possible: allergic  reactions like skin rash, itching or hives, swelling of the face, lips, or tongue breathing problems chest pain fast or irregular heartbeat low blood counts - this medicine may decrease the number of white blood cells, red blood cells and platelets. You may be at increased risk for infections and bleeding. pain, redness, or irritation at site where injected signs of infection - fever or chills, cough, sore throat, pain or difficulty passing urine signs of decreased platelets or bleeding - bruising, pinpoint red spots on the skin, black, tarry stools, blood in the urine swelling of the ankles, feet, hands tiredness weakness Side effects that usually do not require medical attention (report to yourdoctor or health care professional if they continue or are bothersome): diarrhea hair loss mouth sores nail discoloration or damage nausea red colored urine vomiting This list may not describe all possible side effects. Call your doctor for medical advice about side effects. You may report side effects to FDA at1-800-FDA-1088. Where should I keep my medication? This drug is given in a hospital or clinic and will not be stored at home. NOTE: This sheet is a summary. It may not cover all possible information. If you have questions about this medicine, talk to your doctor, pharmacist, orhealth care provider.  2022 Elsevier/Gold Standard (2016-09-02 11:01:26) Cyclophosphamide Injection What is this medication? CYCLOPHOSPHAMIDE (sye kloe FOSS fa mide) is a chemotherapy drug. It slows the growth of cancer cells. This medicine is used to treat many types of cancer like lymphoma, myeloma, leukemia, breast cancer, and ovarian cancer, to name afew. This medicine may be used for other purposes; ask your health care provider orpharmacist if you have questions. COMMON BRAND NAME(S): Cytoxan, Neosar What should I tell my care team before I take this medication? They need to know if you have any of these  conditions: heart disease history of irregular heartbeat infection kidney disease liver disease low blood counts, like white cells, platelets, or red blood cells on hemodialysis recent or ongoing radiation therapy scarring or thickening of the lungs trouble passing urine an unusual or allergic reaction to cyclophosphamide, other medicines, foods, dyes, or preservatives pregnant or trying to get pregnant breast-feeding How should I use this medication? This drug is usually given as an injection into a vein or muscle or by infusion into a vein. It is administered in a hospital or clinic by a specially trainedhealth care professional. Talk to your pediatrician regarding the use of this medicine in children.Special care may be needed. Overdosage: If you think you have taken too much of this medicine contact apoison control center or emergency room at once. NOTE: This medicine is only for you. Do not share this medicine with others. What if I miss a dose? It is important not to miss your dose. Call your doctor or health careprofessional if you are unable to keep an appointment. What may interact with this medication? amphotericin B azathioprine certain antivirals for HIV or hepatitis certain medicines for blood pressure, heart disease, irregular heart beat certain medicines that treat or prevent blood clots like warfarin certain other medicines for cancer cyclosporine etanercept indomethacin medicines that relax muscles for surgery medicines to increase blood counts metronidazole This list may not describe all possible interactions. Give your health care provider a list of all the medicines, herbs, non-prescription drugs, or dietary supplements you use. Also tell them if you smoke, drink alcohol, or use illegaldrugs. Some items may interact with your medicine. What should I watch for while using this medication? Your condition will be monitored carefully while you are receiving  thismedicine. You may need blood work done while you are taking this medicine. Drink water or other fluids as directed. Urinate often, even at night. Some products may contain alcohol. Ask your health care professional if this medicine contains alcohol. Be sure to tell all health care professionals you are taking this medicine. Certain medicines, like metronidazole and disulfiram, can cause an unpleasant reaction when taken with alcohol. The reaction includes flushing, headache, nausea, vomiting, sweating, and increased thirst. Thereaction can last from 30 minutes to several hours. Do not become pregnant while taking this medicine or for 1 year after stopping it. Women should inform their health care professional if they wish to become pregnant or think they might be pregnant. Men should not father a child while taking this medicine and for 4 months after stopping it. There is potential for serious side effects to an unborn child. Talk to your health care professionalfor more information. Do not breast-feed an infant while taking this medicine or for 1 week afterstopping it. This medicine has caused ovarian failure in some women. This medicine may make it more difficult to get pregnant. Talk to your health care professional if Ventura Sellers concerned about your fertility. This medicine has  caused decreased sperm counts in some men. This may make it more difficult to father a child. Talk to your health care professional if Ventura Sellers concerned about your fertility. Call your health care professional for advice if you get a fever, chills, or sore throat, or other symptoms of a cold or flu. Do not treat yourself. This medicine decreases your body's ability to fight infections. Try to avoid beingaround people who are sick. Avoid taking medicines that contain aspirin, acetaminophen, ibuprofen, naproxen, or ketoprofen unless instructed by your health care professional.These medicines may hide a fever. Talk to your health  care professional about your risk of cancer. You may bemore at risk for certain types of cancer if you take this medicine. If you are going to need surgery or other procedure, tell your health careprofessional that you are using this medicine. Be careful brushing or flossing your teeth or using a toothpick because you may get an infection or bleed more easily. If you have any dental work done, Primary school teacher you are receiving this medicine. What side effects may I notice from receiving this medication? Side effects that you should report to your doctor or health care professionalas soon as possible: allergic reactions like skin rash, itching or hives, swelling of the face, lips, or tongue breathing problems nausea, vomiting signs and symptoms of bleeding such as bloody or black, tarry stools; red or dark brown urine; spitting up blood or brown material that looks like coffee grounds; red spots on the skin; unusual bruising or bleeding from the eyes, gums, or nose signs and symptoms of heart failure like fast, irregular heartbeat, sudden weight gain; swelling of the ankles, feet, hands signs and symptoms of infection like fever; chills; cough; sore throat; pain or trouble passing urine signs and symptoms of kidney injury like trouble passing urine or change in the amount of urine signs and symptoms of liver injury like dark yellow or brown urine; general ill feeling or flu-like symptoms; light-colored stools; loss of appetite; nausea; right upper belly pain; unusually weak or tired; yellowing of the eyes or skin Side effects that usually do not require medical attention (report to yourdoctor or health care professional if they continue or are bothersome): confusion decreased hearing diarrhea facial flushing hair loss headache loss of appetite missed menstrual periods signs and symptoms of low red blood cells or anemia such as unusually weak or tired; feeling faint or lightheaded; falls skin  discoloration This list may not describe all possible side effects. Call your doctor for medical advice about side effects. You may report side effects to FDA at1-800-FDA-1088. Where should I keep my medication? This drug is given in a hospital or clinic and will not be stored at home. NOTE: This sheet is a summary. It may not cover all possible information. If you have questions about this medicine, talk to your doctor, pharmacist, orhealth care provider.  2022 Elsevier/Gold Standard (2018-10-24 09:53:29)

## 2020-07-16 NOTE — Progress Notes (Signed)
Insurance prior authorization okay for [employer] today per Otilio Carpen, Estate manager/land agent.

## 2020-07-17 ENCOUNTER — Other Ambulatory Visit: Payer: Self-pay | Admitting: *Deleted

## 2020-07-17 ENCOUNTER — Other Ambulatory Visit: Payer: BC Managed Care – PPO

## 2020-07-17 ENCOUNTER — Ambulatory Visit: Payer: BC Managed Care – PPO | Admitting: Hematology & Oncology

## 2020-07-17 ENCOUNTER — Ambulatory Visit: Payer: BC Managed Care – PPO

## 2020-07-17 DIAGNOSIS — C8223 Follicular lymphoma grade III, unspecified, intra-abdominal lymph nodes: Secondary | ICD-10-CM

## 2020-07-17 LAB — TESTOSTERONE: Testosterone: 410 ng/dL (ref 264–916)

## 2020-07-17 MED ORDER — BACLOFEN 10 MG PO TABS: 10.0000 mg | ORAL_TABLET | Freq: Three times a day (TID) | ORAL | 0 refills | Status: DC

## 2020-07-29 ENCOUNTER — Other Ambulatory Visit (INDEPENDENT_AMBULATORY_CARE_PROVIDER_SITE_OTHER): Payer: Self-pay | Admitting: Physician Assistant

## 2020-07-29 ENCOUNTER — Encounter (INDEPENDENT_AMBULATORY_CARE_PROVIDER_SITE_OTHER): Payer: Self-pay

## 2020-07-29 DIAGNOSIS — E1169 Type 2 diabetes mellitus with other specified complication: Secondary | ICD-10-CM

## 2020-07-29 NOTE — Telephone Encounter (Signed)
Pt last seen by Tracey Aguilar, PA-C.  

## 2020-07-29 NOTE — Telephone Encounter (Signed)
Ok to refill   thanks

## 2020-07-29 NOTE — Telephone Encounter (Signed)
Would you like to refill?  Last OV 07/23/20, next 08/06/20 A1c was 7.1 on 06/25/20

## 2020-07-29 NOTE — Telephone Encounter (Signed)
Message sent to pt-CAS 

## 2020-07-30 ENCOUNTER — Other Ambulatory Visit: Payer: Self-pay | Admitting: *Deleted

## 2020-07-30 DIAGNOSIS — R509 Fever, unspecified: Secondary | ICD-10-CM

## 2020-07-30 DIAGNOSIS — C8223 Follicular lymphoma grade III, unspecified, intra-abdominal lymph nodes: Secondary | ICD-10-CM

## 2020-07-31 ENCOUNTER — Other Ambulatory Visit: Payer: Self-pay | Admitting: *Deleted

## 2020-07-31 ENCOUNTER — Inpatient Hospital Stay: Payer: BC Managed Care – PPO

## 2020-07-31 ENCOUNTER — Other Ambulatory Visit: Payer: Self-pay

## 2020-07-31 DIAGNOSIS — Z5112 Encounter for antineoplastic immunotherapy: Secondary | ICD-10-CM | POA: Diagnosis not present

## 2020-07-31 DIAGNOSIS — Z79899 Other long term (current) drug therapy: Secondary | ICD-10-CM | POA: Diagnosis not present

## 2020-07-31 DIAGNOSIS — R509 Fever, unspecified: Secondary | ICD-10-CM

## 2020-07-31 DIAGNOSIS — R110 Nausea: Secondary | ICD-10-CM | POA: Diagnosis not present

## 2020-07-31 DIAGNOSIS — C8223 Follicular lymphoma grade III, unspecified, intra-abdominal lymph nodes: Secondary | ICD-10-CM | POA: Diagnosis not present

## 2020-07-31 DIAGNOSIS — Z5111 Encounter for antineoplastic chemotherapy: Secondary | ICD-10-CM | POA: Diagnosis not present

## 2020-07-31 LAB — CBC WITH DIFFERENTIAL (CANCER CENTER ONLY)
Abs Immature Granulocytes: 0.01 10*3/uL (ref 0.00–0.07)
Basophils Absolute: 0 10*3/uL (ref 0.0–0.1)
Basophils Relative: 1 %
Eosinophils Absolute: 0 10*3/uL (ref 0.0–0.5)
Eosinophils Relative: 3 %
HCT: 31.6 % — ABNORMAL LOW (ref 39.0–52.0)
Hemoglobin: 10.6 g/dL — ABNORMAL LOW (ref 13.0–17.0)
Immature Granulocytes: 1 %
Lymphocytes Relative: 19 %
Lymphs Abs: 0.3 10*3/uL — ABNORMAL LOW (ref 0.7–4.0)
MCH: 30.3 pg (ref 26.0–34.0)
MCHC: 33.5 g/dL (ref 30.0–36.0)
MCV: 90.3 fL (ref 80.0–100.0)
Monocytes Absolute: 0.5 10*3/uL (ref 0.1–1.0)
Monocytes Relative: 29 %
Neutro Abs: 0.8 10*3/uL — ABNORMAL LOW (ref 1.7–7.7)
Neutrophils Relative %: 47 %
Platelet Count: 164 10*3/uL (ref 150–400)
RBC: 3.5 MIL/uL — ABNORMAL LOW (ref 4.22–5.81)
RDW: 14.6 % (ref 11.5–15.5)
WBC Count: 1.6 10*3/uL — ABNORMAL LOW (ref 4.0–10.5)
nRBC: 0 % (ref 0.0–0.2)

## 2020-07-31 LAB — CMP (CANCER CENTER ONLY)
ALT: 26 U/L (ref 0–44)
AST: 20 U/L (ref 15–41)
Albumin: 4 g/dL (ref 3.5–5.0)
Alkaline Phosphatase: 106 U/L (ref 38–126)
Anion gap: 8 (ref 5–15)
BUN: 13 mg/dL (ref 6–20)
CO2: 26 mmol/L (ref 22–32)
Calcium: 9.3 mg/dL (ref 8.9–10.3)
Chloride: 104 mmol/L (ref 98–111)
Creatinine: 0.88 mg/dL (ref 0.61–1.24)
GFR, Estimated: >60 mL/min (ref >60)
Glucose, Bld: 171 mg/dL — ABNORMAL HIGH (ref 70–99)
Potassium: 3.9 mmol/L (ref 3.5–5.1)
Sodium: 138 mmol/L (ref 135–145)
Total Bilirubin: 0.5 mg/dL (ref 0.3–1.2)
Total Protein: 6.1 g/dL — ABNORMAL LOW (ref 6.5–8.1)

## 2020-07-31 MED ORDER — AMOXICILLIN-POT CLAVULANATE 875-125 MG PO TABS
1.0000 | ORAL_TABLET | Freq: Two times a day (BID) | ORAL | 0 refills | Status: DC
Start: 2020-07-31 — End: 2020-09-19

## 2020-08-01 ENCOUNTER — Other Ambulatory Visit: Payer: Self-pay | Admitting: *Deleted

## 2020-08-01 DIAGNOSIS — C8223 Follicular lymphoma grade III, unspecified, intra-abdominal lymph nodes: Secondary | ICD-10-CM

## 2020-08-01 MED ORDER — ONDANSETRON HCL 8 MG PO TABS: 8.0000 mg | ORAL_TABLET | Freq: Three times a day (TID) | ORAL | 1 refills | Status: DC | PRN

## 2020-08-06 ENCOUNTER — Encounter (INDEPENDENT_AMBULATORY_CARE_PROVIDER_SITE_OTHER): Payer: Self-pay | Admitting: Physician Assistant

## 2020-08-06 ENCOUNTER — Ambulatory Visit (INDEPENDENT_AMBULATORY_CARE_PROVIDER_SITE_OTHER): Payer: BC Managed Care – PPO | Admitting: Physician Assistant

## 2020-08-06 ENCOUNTER — Other Ambulatory Visit: Payer: Self-pay

## 2020-08-06 VITALS — BP 102/64 | HR 113 | Temp 97.9°F | Ht 71.0 in | Wt 268.0 lb

## 2020-08-06 DIAGNOSIS — E559 Vitamin D deficiency, unspecified: Secondary | ICD-10-CM

## 2020-08-06 DIAGNOSIS — Z6838 Body mass index (BMI) 38.0-38.9, adult: Secondary | ICD-10-CM | POA: Diagnosis not present

## 2020-08-07 ENCOUNTER — Telehealth: Payer: Self-pay | Admitting: *Deleted

## 2020-08-07 ENCOUNTER — Other Ambulatory Visit: Payer: Self-pay

## 2020-08-07 ENCOUNTER — Telehealth: Payer: Self-pay

## 2020-08-07 DIAGNOSIS — C8223 Follicular lymphoma grade III, unspecified, intra-abdominal lymph nodes: Secondary | ICD-10-CM

## 2020-08-07 NOTE — Telephone Encounter (Signed)
Received VM from pt stating that he is feeling better but still having low grade fevers in the afternoons.  Per Dr Marin Olp he would like for pt to come in for lab check. Left this information on James Jennings's VM to contact our office and ask for scheduling to make a lab/pac appt on either Thursday or Friday. Scheduling aware. dph

## 2020-08-07 NOTE — Telephone Encounter (Signed)
Per secure chat 08/07/20 - called patient and gave upcoming appointment - patient confirmed

## 2020-08-08 ENCOUNTER — Inpatient Hospital Stay: Payer: BC Managed Care – PPO | Attending: Hematology & Oncology

## 2020-08-08 ENCOUNTER — Other Ambulatory Visit: Payer: Self-pay

## 2020-08-08 ENCOUNTER — Inpatient Hospital Stay: Payer: BC Managed Care – PPO

## 2020-08-08 DIAGNOSIS — Z79899 Other long term (current) drug therapy: Secondary | ICD-10-CM | POA: Insufficient documentation

## 2020-08-08 DIAGNOSIS — C8223 Follicular lymphoma grade III, unspecified, intra-abdominal lymph nodes: Secondary | ICD-10-CM | POA: Diagnosis not present

## 2020-08-08 DIAGNOSIS — E860 Dehydration: Secondary | ICD-10-CM

## 2020-08-08 LAB — CMP (CANCER CENTER ONLY)
ALT: 23 U/L (ref 0–44)
AST: 20 U/L (ref 15–41)
Albumin: 4 g/dL (ref 3.5–5.0)
Alkaline Phosphatase: 100 U/L (ref 38–126)
Anion gap: 7 (ref 5–15)
BUN: 12 mg/dL (ref 6–20)
CO2: 29 mmol/L (ref 22–32)
Calcium: 9.2 mg/dL (ref 8.9–10.3)
Chloride: 106 mmol/L (ref 98–111)
Creatinine: 0.85 mg/dL (ref 0.61–1.24)
GFR, Estimated: >60 mL/min (ref >60)
Glucose, Bld: 136 mg/dL — ABNORMAL HIGH (ref 70–99)
Potassium: 4.1 mmol/L (ref 3.5–5.1)
Sodium: 142 mmol/L (ref 135–145)
Total Bilirubin: 0.4 mg/dL (ref 0.3–1.2)
Total Protein: 6 g/dL — ABNORMAL LOW (ref 6.5–8.1)

## 2020-08-08 LAB — CBC WITH DIFFERENTIAL (CANCER CENTER ONLY)
Abs Immature Granulocytes: 0.02 10*3/uL (ref 0.00–0.07)
Basophils Absolute: 0 10*3/uL (ref 0.0–0.1)
Basophils Relative: 1 %
Eosinophils Absolute: 0.1 10*3/uL (ref 0.0–0.5)
Eosinophils Relative: 2 %
HCT: 31.2 % — ABNORMAL LOW (ref 39.0–52.0)
Hemoglobin: 10.4 g/dL — ABNORMAL LOW (ref 13.0–17.0)
Immature Granulocytes: 1 %
Lymphocytes Relative: 13 %
Lymphs Abs: 0.5 10*3/uL — ABNORMAL LOW (ref 0.7–4.0)
MCH: 31 pg (ref 26.0–34.0)
MCHC: 33.3 g/dL (ref 30.0–36.0)
MCV: 92.9 fL (ref 80.0–100.0)
Monocytes Absolute: 0.9 10*3/uL (ref 0.1–1.0)
Monocytes Relative: 23 %
Neutro Abs: 2.4 10*3/uL (ref 1.7–7.7)
Neutrophils Relative %: 60 %
Platelet Count: 247 10*3/uL (ref 150–400)
RBC: 3.36 MIL/uL — ABNORMAL LOW (ref 4.22–5.81)
RDW: 14.8 % (ref 11.5–15.5)
WBC Count: 3.9 10*3/uL — ABNORMAL LOW (ref 4.0–10.5)
nRBC: 0 % (ref 0.0–0.2)

## 2020-08-08 MED ORDER — SODIUM CHLORIDE 0.9 % IV SOLN
Freq: Once | INTRAVENOUS | Status: AC
  Filled 2020-08-08: qty 250

## 2020-08-08 NOTE — Patient Instructions (Signed)

## 2020-08-08 NOTE — Patient Instructions (Signed)

## 2020-08-12 ENCOUNTER — Encounter: Payer: Self-pay | Admitting: *Deleted

## 2020-08-12 ENCOUNTER — Ambulatory Visit (HOSPITAL_COMMUNITY)
Admission: RE | Admit: 2020-08-12 | Discharge: 2020-08-12 | Disposition: A | Discharge status: Home / Self Care | Payer: BC Managed Care – PPO | Source: Ambulatory Visit | Attending: Hematology & Oncology | Admitting: Hematology & Oncology

## 2020-08-12 ENCOUNTER — Other Ambulatory Visit: Payer: Self-pay

## 2020-08-12 DIAGNOSIS — C8590 Non-Hodgkin lymphoma, unspecified, unspecified site: Secondary | ICD-10-CM | POA: Diagnosis not present

## 2020-08-12 DIAGNOSIS — C8223 Follicular lymphoma grade III, unspecified, intra-abdominal lymph nodes: Secondary | ICD-10-CM | POA: Insufficient documentation

## 2020-08-12 LAB — GLUCOSE, CAPILLARY: Glucose-Capillary: 142 mg/dL — ABNORMAL HIGH (ref 70–99)

## 2020-08-12 MED ORDER — FLUDEOXYGLUCOSE F - 18 (FDG) INJECTION
13.2100 | Freq: Once | INTRAVENOUS | Status: AC
  Administered 2020-08-12: 13.21 via INTRAVENOUS

## 2020-08-12 NOTE — Progress Notes (Signed)
Chief Complaint:   OBESITY James Jennings is here to discuss his progress with his obesity treatment plan along with follow-up of his obesity related diagnoses. James Jennings is on the Category 4 Plan and states he is following his eating plan approximately 75% of the time. James Jennings states he has been walking for 20 minutes only 4 days this week.  Today's visit was #: 8 Starting weight: 277 lbs Starting date: 03/14/2020 Today's weight: 268 lbs Today's date: 08/06/2020 Total lbs lost to date: 9 Total lbs lost since last in-office visit: 1  Interim History: James Jennings finished his last chemo treatment 3 weeks ago and he continues to feel nauseous. He is eating what he can, mostly comfort foods. He has been supplementing with protein shakes. His energy level is low currently.  Subjective:   1. Vitamin D deficiency James Jennings is not on medications. Last Vit D level was 42.9. His energy was low but he is recovering from chemo.  Assessment/Plan:   1. Vitamin D deficiency Low Vitamin D level contributes to fatigue and are associated with obesity, breast, and colon cancer. James Jennings will continue his plan, and we will recheck his Vit D next month. James Jennings will follow-up for routine testing of Vitamin D, at least 2-3 times per year to avoid over-replacement.  2. Class 2 severe obesity with serious comorbidity and body mass index (BMI) of 38.0 to 38.9 in adult, unspecified obesity type James Jennings) James Jennings is currently in the action stage of change. As such, his goal is to continue with weight loss efforts. He has agreed to the Category 4 Plan.   Exercise goals: As is.  Behavioral modification strategies: increasing lean protein intake and decreasing simple carbohydrates.  James Jennings has agreed to follow-up with our clinic in 2 weeks. He was informed of the importance of frequent follow-up visits to maximize his success with intensive lifestyle modifications for his multiple health conditions.   Objective:   Blood pressure 102/64, pulse (!)  113, temperature 97.9 F (36.6 C), height 5\' 11"  (1.803 m), weight 268 lb (121.6 kg), SpO2 94 %. Body mass index is 37.38 kg/m.  General: Cooperative, alert, well developed, in no acute distress. HEENT: Conjunctivae and lids unremarkable. Cardiovascular: Regular rhythm.  Lungs: Normal work of breathing. Neurologic: No focal deficits.   Lab Results  Component Value Date   CREATININE 0.85 08/08/2020   BUN 12 08/08/2020   NA 142 08/08/2020   K 4.1 08/08/2020   CL 106 08/08/2020   CO2 29 08/08/2020   Lab Results  Component Value Date   ALT 23 08/08/2020   AST 20 08/08/2020   ALKPHOS 100 08/08/2020   BILITOT 0.4 08/08/2020   Lab Results  Component Value Date   HGBA1C 7.1 (H) 06/25/2020   HGBA1C 8.2 (H) 03/14/2020   HGBA1C 6.2 (H) 08/10/2018   Lab Results  Component Value Date   INSULIN 35.2 (H) 06/25/2020   INSULIN 28.0 (H) 03/14/2020   INSULIN 24.6 08/10/2018   Lab Results  Component Value Date   TSH 2.603 07/16/2020   Lab Results  Component Value Date   CHOL 148 06/25/2020   HDL 33 (L) 06/25/2020   LDLCALC 86 06/25/2020   TRIG 169 (H) 06/25/2020   CHOLHDL 4.5 06/25/2020   Lab Results  Component Value Date   VD25OH 42.9 06/25/2020   VD25OH 41.3 03/14/2020   VD25OH 51.2 08/10/2018   Lab Results  Component Value Date   WBC 3.9 (L) 08/08/2020   HGB 10.4 (L) 08/08/2020  HCT 31.2 (L) 08/08/2020   MCV 92.9 08/08/2020   PLT 247 08/08/2020   No results found for: IRON, TIBC, FERRITIN  Attestation Statements:   Reviewed by clinician on day of visit: allergies, medications, problem list, medical history, surgical history, family history, social history, and previous encounter notes.  Time spent on visit including pre-visit chart review and post-visit care and charting was 30 minutes.    Wilhemena Durie, am acting as transcriptionist for [employer], PA-C.  I have reviewed the above documentation for accuracy and completeness, and I agree with the  above. Abby Potash, PA-C

## 2020-08-14 ENCOUNTER — Other Ambulatory Visit: Payer: Self-pay | Admitting: Hematology & Oncology

## 2020-08-14 DIAGNOSIS — C8223 Follicular lymphoma grade III, unspecified, intra-abdominal lymph nodes: Secondary | ICD-10-CM

## 2020-08-22 ENCOUNTER — Other Ambulatory Visit: Payer: Self-pay

## 2020-08-22 ENCOUNTER — Ambulatory Visit (INDEPENDENT_AMBULATORY_CARE_PROVIDER_SITE_OTHER): Payer: BC Managed Care – PPO | Admitting: Physician Assistant

## 2020-08-22 ENCOUNTER — Encounter (INDEPENDENT_AMBULATORY_CARE_PROVIDER_SITE_OTHER): Payer: Self-pay | Admitting: Physician Assistant

## 2020-08-22 VITALS — BP 112/73 | HR 107 | Temp 98.2°F | Ht 71.0 in | Wt 267.0 lb

## 2020-08-22 DIAGNOSIS — E782 Mixed hyperlipidemia: Secondary | ICD-10-CM

## 2020-08-22 DIAGNOSIS — Z6837 Body mass index (BMI) 37.0-37.9, adult: Secondary | ICD-10-CM

## 2020-08-22 DIAGNOSIS — E1169 Type 2 diabetes mellitus with other specified complication: Secondary | ICD-10-CM

## 2020-08-22 DIAGNOSIS — Z9189 Other specified personal risk factors, not elsewhere classified: Secondary | ICD-10-CM | POA: Diagnosis not present

## 2020-08-22 MED ORDER — OZEMPIC (0.25 OR 0.5 MG/DOSE) 2 MG/1.5ML ~~LOC~~ SOPN: 0.5000 mg | PEN_INJECTOR | SUBCUTANEOUS | 0 refills | Status: DC

## 2020-08-27 ENCOUNTER — Encounter: Payer: Self-pay | Admitting: Hematology & Oncology

## 2020-08-27 ENCOUNTER — Inpatient Hospital Stay (HOSPITAL_BASED_OUTPATIENT_CLINIC_OR_DEPARTMENT_OTHER): Payer: BC Managed Care – PPO | Admitting: Hematology & Oncology

## 2020-08-27 ENCOUNTER — Inpatient Hospital Stay: Payer: BC Managed Care – PPO

## 2020-08-27 ENCOUNTER — Telehealth: Payer: Self-pay

## 2020-08-27 ENCOUNTER — Other Ambulatory Visit: Payer: Self-pay

## 2020-08-27 VITALS — BP 109/74 | HR 97 | Temp 98.0°F | Resp 18 | Wt 272.0 lb

## 2020-08-27 DIAGNOSIS — C8223 Follicular lymphoma grade III, unspecified, intra-abdominal lymph nodes: Secondary | ICD-10-CM

## 2020-08-27 DIAGNOSIS — Z79899 Other long term (current) drug therapy: Secondary | ICD-10-CM | POA: Diagnosis not present

## 2020-08-27 LAB — CBC WITH DIFFERENTIAL (CANCER CENTER ONLY)
Abs Immature Granulocytes: 0.01 10*3/uL (ref 0.00–0.07)
Basophils Absolute: 0 10*3/uL (ref 0.0–0.1)
Basophils Relative: 1 %
Eosinophils Absolute: 0.2 10*3/uL (ref 0.0–0.5)
Eosinophils Relative: 5 %
HCT: 33.4 % — ABNORMAL LOW (ref 39.0–52.0)
Hemoglobin: 11.2 g/dL — ABNORMAL LOW (ref 13.0–17.0)
Immature Granulocytes: 0 %
Lymphocytes Relative: 12 %
Lymphs Abs: 0.5 10*3/uL — ABNORMAL LOW (ref 0.7–4.0)
MCH: 31 pg (ref 26.0–34.0)
MCHC: 33.5 g/dL (ref 30.0–36.0)
MCV: 92.5 fL (ref 80.0–100.0)
Monocytes Absolute: 0.8 10*3/uL (ref 0.1–1.0)
Monocytes Relative: 18 %
Neutro Abs: 2.8 10*3/uL (ref 1.7–7.7)
Neutrophils Relative %: 64 %
Platelet Count: 228 10*3/uL (ref 150–400)
RBC: 3.61 MIL/uL — ABNORMAL LOW (ref 4.22–5.81)
RDW: 14 % (ref 11.5–15.5)
WBC Count: 4.3 10*3/uL (ref 4.0–10.5)
nRBC: 0 % (ref 0.0–0.2)

## 2020-08-27 LAB — CMP (CANCER CENTER ONLY)
ALT: 29 U/L (ref 0–44)
AST: 22 U/L (ref 15–41)
Albumin: 4.2 g/dL (ref 3.5–5.0)
Alkaline Phosphatase: 107 U/L (ref 38–126)
Anion gap: 7 (ref 5–15)
BUN: 17 mg/dL (ref 6–20)
CO2: 28 mmol/L (ref 22–32)
Calcium: 9.6 mg/dL (ref 8.9–10.3)
Chloride: 104 mmol/L (ref 98–111)
Creatinine: 0.92 mg/dL (ref 0.61–1.24)
GFR, Estimated: >60 mL/min (ref >60)
Glucose, Bld: 125 mg/dL — ABNORMAL HIGH (ref 70–99)
Potassium: 4 mmol/L (ref 3.5–5.1)
Sodium: 139 mmol/L (ref 135–145)
Total Bilirubin: 0.4 mg/dL (ref 0.3–1.2)
Total Protein: 6.5 g/dL (ref 6.5–8.1)

## 2020-08-27 LAB — LACTATE DEHYDROGENASE: LDH: 166 U/L (ref 98–192)

## 2020-08-27 MED ORDER — SODIUM CHLORIDE 0.9% FLUSH
10.0000 mL | INTRAVENOUS | Status: DC | PRN
  Administered 2020-08-27: 10 mL via INTRAVENOUS
  Filled 2020-08-27: qty 10

## 2020-08-27 MED ORDER — HEPARIN SOD (PORK) LOCK FLUSH 100 UNIT/ML IV SOLN
500.0000 [IU] | Freq: Once | INTRAVENOUS | Status: AC
  Administered 2020-08-27: 500 [IU] via INTRAVENOUS
  Filled 2020-08-27: qty 5

## 2020-08-27 NOTE — Progress Notes (Signed)
Hematology and Oncology Follow Up Visit  Olan Loughnane [id number] [birth year] 50 y.o. 08/27/2020   Principle Diagnosis:  Follicular B- cell non-Hodgkin's lymphoma - Relapsed   Past Therapy:             Rituxan/bendamustine-s/p cycle 6 - completed on 07/03/2016   Current Therapy:  Gazyva  - Cytoxan/ Vincristine/Prednisone  - started 11/07/2019, s/p cycle #4 --  D/c on 03/26/2020 [facility] -- s/p cycle #6-- started on 04/03/2020 -completed on 07/13/2020 Maintenance Gazyva-start cycle 1/12 on 09/05/2020    Interim History:  Mr. Gillitzer is here today for follow-up.  He looks fantastic.  He is given over the side effects of the chemotherapy.  I think the Adriamycin certainly was tough on him.  However, this really was the most active part of the protocol.  We did do a PET scan on him.  The PET scan was done on 08/12/2020.  The PET scan did not show any evidence of active lymphoma.[name]  He is in remission.  As such, we will now put him on maintenance Gazyva.  I think this would be important for him.  He has had no problems with fever.  He has had little bit of sinus congestion.  He is off to [place] today.  Has a business meeting out there.  He has had no issues with bowels or bladder.  Apparently, his son has had a urinary tract issue.  This is quite unusual seeing that he is only I think 52 years old.  He has had no problems with leg swelling.  His blood sugars are doing a little bit better.  His weight is still up. We did check a TSH on him in June.  His TSH was 2.6.  We also checked a testosterone level on him in June.  His testosterone level was 410.  Overall, his performance status is ECOG 0.   Medications:  Allergies as of 08/27/2020       Reactions   Mango Flavor Swelling, Other (See Comments)   LIPS SWELL   Shellfish Allergy Anaphylaxis   Lactose Intolerance (gi) Diarrhea   Rituximab Other (See Comments)        Medication List        Accurate as of August 27, 2020  9:25 AM. If you  have any questions, ask your nurse or doctor.          acetaminophen 325 MG tablet Commonly known as: TYLENOL Take 650 mg by mouth every 6 (six) hours as needed for moderate pain or headache.   amoxicillin-clavulanate 875-125 MG tablet Commonly known as: AUGMENTIN Take 1 tablet by mouth 2 (two) times daily.   atorvastatin 80 MG tablet Commonly known as: LIPITOR TAKE 1 TABLET BY MOUTH EVERY DAY   baclofen 10 MG tablet Commonly known as: LIORESAL TAKE 1 TABLET BY MOUTH THREE TIMES A DAY   cetirizine 10 MG tablet Commonly known as: ZYRTEC Take 10 mg by mouth daily as needed for allergies.   dexamethasone 4 MG tablet Commonly known as: DECADRON Take 12 mg by mouth daily. 06/26/2020 Takes '12mg'$  starting the day of chemo x 5 days.   ibuprofen 200 MG tablet Commonly known as: ADVIL Take 600 mg by mouth every 8 (eight) hours as needed for mild pain or moderate pain.   lidocaine-prilocaine cream Commonly known as: EMLA Apply 1 application topically as needed. Apply to [facility] 1 hour prior to procedure.   LORazepam 0.5 MG tablet Commonly known as: ATIVAN Take 1 tablet (0.5  mg total) by mouth every 6 (six) hours as needed for anxiety.   metFORMIN 500 MG tablet Commonly known as: GLUCOPHAGE Take 1 tablet (500 mg total) by mouth daily.   metoCLOPramide 10 MG tablet Commonly known as: REGLAN Take 1 tablet (10 mg total) by mouth every 6 (six) hours as needed for nausea.   montelukast 10 MG tablet Commonly known as: SINGULAIR Take 1 tablet (10 mg total) by mouth at bedtime.   multivitamin Tabs tablet Take 1 tablet by mouth daily.   ondansetron 8 MG tablet Commonly known as: ZOFRAN Take 1 tablet (8 mg total) by mouth every 8 (eight) hours as needed for nausea or vomiting.   Ozempic (0.25 or 0.5 MG/DOSE) 2 MG/1.5ML Sopn Generic drug: Semaglutide(0.25 or 0.'5MG'$ /DOS) Inject 0.5 mg as directed once a week.   prochlorperazine 10 MG tablet Commonly known as: COMPAZINE Take 1  tablet (10 mg total) by mouth every 6 (six) hours as needed for nausea or vomiting.        Allergies:  Allergies  Allergen Reactions   Mango Flavor Swelling and Other (See Comments)    LIPS SWELL   Shellfish Allergy Anaphylaxis   Lactose Intolerance (Gi) Diarrhea   Rituximab Other (See Comments)    Past Medical History, Surgical history, Social history, and Family History were reviewed and updated.  Review of Systems: Review of Systems  Constitutional: Negative.   HENT: Negative.    Eyes: Negative.   Respiratory: Negative.    Cardiovascular: Negative.   Gastrointestinal: Negative.   Genitourinary: Negative.   Musculoskeletal: Negative.   Skin: Negative.   Neurological: Negative.   Endo/Heme/Allergies: Negative.   Psychiatric/Behavioral: Negative.      Physical Exam:  weight is 272 lb (123.4 kg). His oral temperature is 98 F (36.7 C). His blood pressure is 109/74 and his pulse is 97. His respiration is 18 and oxygen saturation is 97%.   Wt Readings from Last 3 Encounters:  08/27/20 272 lb (123.4 kg)  08/22/20 267 lb (121.1 kg)  08/06/20 268 lb (121.6 kg)    Physical Exam Vitals reviewed.  HENT:     Head: Normocephalic and atraumatic.  Eyes:     Pupils: Pupils are equal, round, and reactive to light.  Cardiovascular:     Rate and Rhythm: Normal rate and regular rhythm.     Heart sounds: Normal heart sounds.  Pulmonary:     Effort: Pulmonary effort is normal.     Breath sounds: Normal breath sounds.  Abdominal:     General: Bowel sounds are normal.     Palpations: Abdomen is soft.  Musculoskeletal:        General: No tenderness or deformity. Normal range of motion.     Cervical back: Normal range of motion.  Lymphadenopathy:     Cervical: No cervical adenopathy.  Skin:    General: Skin is warm and dry.     Findings: No erythema or rash.  Neurological:     Mental Status: He is alert and oriented to person, place, and time.  Psychiatric:         Behavior: Behavior normal.        Thought Content: Thought content normal.        Judgment: Judgment normal.     Lab Results  Component Value Date   WBC 4.3 08/27/2020   HGB 11.2 (L) 08/27/2020   HCT 33.4 (L) 08/27/2020   MCV 92.5 08/27/2020   PLT 228 08/27/2020   No results found for:  FERRITIN, IRON, TIBC, UIBC, IRONPCTSAT Lab Results  Component Value Date   RETICCTPCT 1.07 12/06/2015   RBC 3.61 (L) 08/27/2020   RETICCTABS 48.79 12/06/2015   No results found for: Nils Pyle [facility] Lab Results  Component Value Date   IGGSERUM 785 06/04/2016   IGMSERUM 52 06/04/2016   Lab Results  Component Value Date   TOTALPROTELP 6.5 10/31/2019   ALBUMINELP 3.7 10/31/2019   A1GS 0.2 10/31/2019   A2GS 0.7 10/31/2019   BETS 1.1 10/31/2019   GAMS 0.8 10/31/2019   MSPIKE Not Observed 10/31/2019     Chemistry      Component Value Date/Time   NA 139 08/27/2020 0820   NA 139 06/25/2020 0722   NA 141 12/03/2016 1155   NA 141 02/07/2016 1149   K 4.0 08/27/2020 0820   K 3.8 12/03/2016 1155   K 4.3 02/07/2016 1149   CL 104 08/27/2020 0820   CL 100 12/03/2016 1155   CO2 28 08/27/2020 0820   CO2 27 12/03/2016 1155   CO2 27 02/07/2016 1149   BUN 17 08/27/2020 0820   BUN 18 06/25/2020 0722   BUN 10 12/03/2016 1155   BUN 10.9 02/07/2016 1149   CREATININE 0.92 08/27/2020 0820   CREATININE 1.0 12/03/2016 1155   CREATININE 0.9 02/07/2016 1149      Component Value Date/Time   CALCIUM 9.6 08/27/2020 0820   CALCIUM 9.1 12/03/2016 1155   CALCIUM 9.6 02/07/2016 1149   ALKPHOS 107 08/27/2020 0820   ALKPHOS 99 (H) 12/03/2016 1155   ALKPHOS 127 02/07/2016 1149   AST 22 08/27/2020 0820   AST 17 02/07/2016 1149   ALT 29 08/27/2020 0820   ALT 24 12/03/2016 1155   ALT 16 02/07/2016 1149   BILITOT 0.4 08/27/2020 0820   BILITOT 0.42 02/07/2016 1149       Impression and Plan: Mr. Carabetta is a pleasant 50 yo caucasian gentleman with history of follicular large cell  non-Hodgkin's lymphoma. He completed 6 cycles of chemotherapy with bendamustine on 07/03/2016 but was not tolerant of Rituxan (anaphylaxis). His lymphoma has now recurred.   We will now put on maintenance Gazyva.  I think this is very reasonable.  I would treat him every 2 months for 2 years.  I think this would be reasonable.  I think this would be standard of care.  We will start in early August.  I will see him back myself in September.  I do not think we have to do another scan on him probably until the late fall or early winter.   [name], MD 7/26/20229:25 AM

## 2020-08-27 NOTE — Telephone Encounter (Signed)
Appts made per 7.26.22 los, pt to gain sch at [employer] and through [employer]

## 2020-08-27 NOTE — Patient Instructions (Signed)

## 2020-08-28 NOTE — Progress Notes (Signed)
Chief Complaint:   OBESITY James Jennings is here to discuss his progress with his obesity treatment plan along with follow-up of his obesity related diagnoses. James Jennings is on the Category 4 Plan and states he is following his eating plan approximately 65-70% of the time. James Jennings states he is walking at [employer] 2 miles 5 times per week.   Today's visit was #: 9 Starting weight: 277 lbs Starting date: 03/14/2020 Today's weight: 267 lbs Today's date: 08/22/2020 Total lbs lost to date: 10 Total lbs lost since last in-office visit: 1  Interim History: James Jennings recently returned from [employer] where he celebrated a clear PET scan and he did not eat on the plan. He is having trouble getting in all of the protein, especially at dinner.  Subjective:   1. Type 2 diabetes mellitus with other specified complication, without long-term current use of insulin ([name]) James Jennings's last A1c was 7.1, and he denies polyphagia. He is having trouble eating all of his food.  2. Mixed hyperlipidemia James Jennings is not on medications, and he denies chest pain.  3. At risk for hypoglycemia James Jennings is at increased risk for hypoglycemia due to changes in diet, diagnosis of diabetes, and/or insulin use.   Assessment/Plan:   1. Type 2 diabetes mellitus with other specified complication, without long-term current use of insulin ([place]) Tysheem will continue his medications, and we will refill Ozempic for 1 month. Good blood sugar control is important to decrease the likelihood of diabetic complications such as nephropathy, neuropathy, limb loss, blindness, coronary artery disease, and death. Intensive lifestyle modification including diet, exercise and weight loss are the first line of treatment for diabetes.   - Semaglutide,0.25 or 0.'5MG'$ /DOS, (OZEMPIC, 0.25 OR 0.5 MG/DOSE,) 2 MG/1.5ML SOPN; Inject 0.5 mg as directed once a week.  Dispense: 1.5 mL; Refill: 0  2. Mixed hyperlipidemia Cardiovascular risk and specific lipid/LDL goals reviewed. We  discussed several lifestyle modifications today. James Jennings will continue with the meal plan, and increase activity as tolerated. Orders and follow up as documented in patient record.   Counseling Intensive lifestyle modifications are the first line treatment for this issue. Dietary changes: Increase soluble fiber. Decrease simple carbohydrates. Exercise changes: Moderate to vigorous-intensity aerobic activity 150 minutes per week if tolerated. Lipid-lowering medications: see documented in medical record.  3. At risk for hypoglycemia James Jennings was given approximately 15 minutes of counseling today regarding prevention of hypoglycemia. He was advised of symptoms of hypoglycemia. James Jennings was instructed to avoid skipping meals, eat regular protein rich meals and schedule low calorie snacks as needed.   Repetitive spaced learning was employed today to elicit superior memory formation and behavioral change  4. Class 2 severe obesity with serious comorbidity and body mass index (BMI) of 37.0 to 37.9 in adult, unspecified obesity type (James Jennings), current bmi 37.26 James Jennings is currently in the action stage of change. As such, his goal is to continue with weight loss efforts. He has agreed to the Category 4 Plan.   Add more protein to her breakfast meal.  Exercise goals: As is.  Behavioral modification strategies: increasing lean protein intake and no skipping meals.  James Jennings has agreed to follow-up with our clinic in 3 weeks. He was informed of the importance of frequent follow-up visits to maximize his success with intensive lifestyle modifications for his multiple health conditions.   Objective:   Blood pressure 112/73, pulse (!) 107, temperature 98.2 F (36.8 C), height '5\' 11"'$  (1.803 m), weight 267 lb (121.1 kg), SpO2 95 %.  Body mass index is 37.24 kg/m.  General: Cooperative, alert, well developed, in no acute distress. HEENT: Conjunctivae and lids unremarkable. Cardiovascular: Regular rhythm.  Lungs: Normal  work of breathing. Neurologic: No focal deficits.   Lab Results  Component Value Date   CREATININE 0.92 08/27/2020   BUN 17 08/27/2020   NA 139 08/27/2020   K 4.0 08/27/2020   CL 104 08/27/2020   CO2 28 08/27/2020   Lab Results  Component Value Date   ALT 29 08/27/2020   AST 22 08/27/2020   ALKPHOS 107 08/27/2020   BILITOT 0.4 08/27/2020   Lab Results  Component Value Date   HGBA1C 7.1 (H) 06/25/2020   HGBA1C 8.2 (H) 03/14/2020   HGBA1C 6.2 (H) 08/10/2018   Lab Results  Component Value Date   INSULIN 35.2 (H) 06/25/2020   INSULIN 28.0 (H) 03/14/2020   INSULIN 24.6 08/10/2018   Lab Results  Component Value Date   TSH 2.603 07/16/2020   Lab Results  Component Value Date   CHOL 148 06/25/2020   HDL 33 (L) 06/25/2020   LDLCALC 86 06/25/2020   TRIG 169 (H) 06/25/2020   CHOLHDL 4.5 06/25/2020   Lab Results  Component Value Date   VD25OH 42.9 06/25/2020   VD25OH 41.3 03/14/2020   VD25OH 51.2 08/10/2018   Lab Results  Component Value Date   WBC 4.3 08/27/2020   HGB 11.2 (L) 08/27/2020   HCT 33.4 (L) 08/27/2020   MCV 92.5 08/27/2020   PLT 228 08/27/2020   No results found for: IRON, TIBC, FERRITIN  Attestation Statements:   Reviewed by clinician on day of visit: allergies, medications, problem list, medical history, surgical history, family history, social history, and previous encounter notes.   Wilhemena Durie, am acting as transcriptionist for [employer], PA-C.  I have reviewed the above documentation for accuracy and completeness, and I agree with the above. Abby Potash, PA-C

## 2020-08-31 ENCOUNTER — Other Ambulatory Visit (INDEPENDENT_AMBULATORY_CARE_PROVIDER_SITE_OTHER): Payer: Self-pay | Admitting: Physician Assistant

## 2020-08-31 DIAGNOSIS — E782 Mixed hyperlipidemia: Secondary | ICD-10-CM

## 2020-09-02 NOTE — Telephone Encounter (Signed)
Last OV with Tracey 

## 2020-09-02 NOTE — Telephone Encounter (Signed)
Pt last seen by Tracey Aguilar, PA-C.  

## 2020-09-02 NOTE — Telephone Encounter (Signed)
LAST APPOINTMENT DATE: 7/21 NEXT APPOINTMENT DATE: 8/17   [id number] [address] [place]: [phone]: [phone]Phone: [phone]: [phone] Patient is requesting a refill of the following medications: Requested Prescriptions   Pending Prescriptions Disp Refills   atorvastatin (LIPITOR) 80 MG tablet [Pharmacy Med Name: ATORVASTATIN 80 MG TABLET] 90 tablet 0    Sig: TAKE 1 TABLET BY MOUTH EVERY DAY    Date last filled: 5/3 for 90 day  [facility] Lab Results  Component Value Date   HGBA1C 7.1 (H) 06/25/2020   HGBA1C 8.2 (H) 03/14/2020   HGBA1C 6.2 (H) 08/10/2018   Lab Results  Component Value Date   LDLCALC 86 06/25/2020   CREATININE 0.92 08/27/2020   Lab Results  Component Value Date   VD25OH 42.9 06/25/2020   VD25OH 41.3 03/14/2020   VD25OH 51.2 08/10/2018    BP Readings from Last 3 Encounters:  08/27/20 109/74  08/22/20 112/73  08/08/20 114/66

## 2020-09-03 ENCOUNTER — Other Ambulatory Visit: Payer: Self-pay | Admitting: Hematology & Oncology

## 2020-09-04 ENCOUNTER — Other Ambulatory Visit: Payer: Self-pay | Admitting: *Deleted

## 2020-09-05 ENCOUNTER — Other Ambulatory Visit: Payer: Self-pay

## 2020-09-05 ENCOUNTER — Inpatient Hospital Stay: Payer: BC Managed Care – PPO

## 2020-09-05 ENCOUNTER — Inpatient Hospital Stay: Payer: BC Managed Care – PPO | Attending: Hematology & Oncology

## 2020-09-05 VITALS — BP 121/76 | HR 77 | Temp 98.1°F | Resp 17

## 2020-09-05 DIAGNOSIS — Z5112 Encounter for antineoplastic immunotherapy: Secondary | ICD-10-CM | POA: Insufficient documentation

## 2020-09-05 DIAGNOSIS — C8223 Follicular lymphoma grade III, unspecified, intra-abdominal lymph nodes: Secondary | ICD-10-CM | POA: Insufficient documentation

## 2020-09-05 LAB — CBC WITH DIFFERENTIAL (CANCER CENTER ONLY)
Abs Immature Granulocytes: 0.01 10*3/uL (ref 0.00–0.07)
Basophils Absolute: 0 10*3/uL (ref 0.0–0.1)
Basophils Relative: 1 %
Eosinophils Absolute: 0.2 10*3/uL (ref 0.0–0.5)
Eosinophils Relative: 4 %
HCT: 33.3 % — ABNORMAL LOW (ref 39.0–52.0)
Hemoglobin: 11.3 g/dL — ABNORMAL LOW (ref 13.0–17.0)
Immature Granulocytes: 0 %
Lymphocytes Relative: 15 %
Lymphs Abs: 0.6 10*3/uL — ABNORMAL LOW (ref 0.7–4.0)
MCH: 31.5 pg (ref 26.0–34.0)
MCHC: 33.9 g/dL (ref 30.0–36.0)
MCV: 92.8 fL (ref 80.0–100.0)
Monocytes Absolute: 0.6 10*3/uL (ref 0.1–1.0)
Monocytes Relative: 14 %
Neutro Abs: 2.5 10*3/uL (ref 1.7–7.7)
Neutrophils Relative %: 66 %
Platelet Count: 210 10*3/uL (ref 150–400)
RBC: 3.59 MIL/uL — ABNORMAL LOW (ref 4.22–5.81)
RDW: 13.6 % (ref 11.5–15.5)
WBC Count: 3.8 10*3/uL — ABNORMAL LOW (ref 4.0–10.5)
nRBC: 0 % (ref 0.0–0.2)

## 2020-09-05 LAB — CMP (CANCER CENTER ONLY)
ALT: 22 U/L (ref 0–44)
AST: 21 U/L (ref 15–41)
Albumin: 4 g/dL (ref 3.5–5.0)
Alkaline Phosphatase: 111 U/L (ref 38–126)
Anion gap: 8 (ref 5–15)
BUN: 19 mg/dL (ref 6–20)
CO2: 26 mmol/L (ref 22–32)
Calcium: 9.1 mg/dL (ref 8.9–10.3)
Chloride: 107 mmol/L (ref 98–111)
Creatinine: 0.89 mg/dL (ref 0.61–1.24)
GFR, Estimated: >60 mL/min (ref >60)
Glucose, Bld: 105 mg/dL — ABNORMAL HIGH (ref 70–99)
Potassium: 4.3 mmol/L (ref 3.5–5.1)
Sodium: 141 mmol/L (ref 135–145)
Total Bilirubin: 0.4 mg/dL (ref 0.3–1.2)
Total Protein: 6.2 g/dL — ABNORMAL LOW (ref 6.5–8.1)

## 2020-09-05 LAB — LACTATE DEHYDROGENASE: LDH: 155 U/L (ref 98–192)

## 2020-09-05 MED ORDER — SODIUM CHLORIDE 0.9 % IV SOLN
Freq: Once | INTRAVENOUS | Status: AC
  Filled 2020-09-05: qty 250

## 2020-09-05 MED ORDER — DIPHENHYDRAMINE HCL 50 MG/ML IJ SOLN
INTRAMUSCULAR | Status: AC
  Filled 2020-09-05: qty 1

## 2020-09-05 MED ORDER — ACETAMINOPHEN 325 MG PO TABS
ORAL_TABLET | ORAL | Status: AC
  Filled 2020-09-05: qty 2

## 2020-09-05 MED ORDER — ACETAMINOPHEN 325 MG PO TABS
650.0000 mg | ORAL_TABLET | Freq: Once | ORAL | Status: AC
  Administered 2020-09-05: 650 mg via ORAL

## 2020-09-05 MED ORDER — SODIUM CHLORIDE 0.9% FLUSH
10.0000 mL | INTRAVENOUS | Status: DC | PRN
  Administered 2020-09-05: 10 mL
  Filled 2020-09-05: qty 10

## 2020-09-05 MED ORDER — SODIUM CHLORIDE 0.9 % IV SOLN
40.0000 mg | Freq: Once | INTRAVENOUS | Status: AC
  Administered 2020-09-05: 40 mg via INTRAVENOUS
  Filled 2020-09-05: qty 4

## 2020-09-05 MED ORDER — HEPARIN SOD (PORK) LOCK FLUSH 100 UNIT/ML IV SOLN
500.0000 [IU] | Freq: Once | INTRAVENOUS | Status: AC | PRN
  Administered 2020-09-05: 500 [IU]
  Filled 2020-09-05: qty 5

## 2020-09-05 MED ORDER — DIPHENHYDRAMINE HCL 50 MG/ML IJ SOLN
12.5000 mg | Freq: Once | INTRAMUSCULAR | Status: AC
  Administered 2020-09-05: 12.5 mg via INTRAVENOUS

## 2020-09-05 MED ORDER — SODIUM CHLORIDE 0.9 % IV SOLN
1000.0000 mg | Freq: Once | INTRAVENOUS | Status: AC
  Administered 2020-09-05: 1000 mg via INTRAVENOUS
  Filled 2020-09-05: qty 40

## 2020-09-05 NOTE — Patient Instructions (Signed)
Tunneled Central Venous Catheter Flushing Guide It is important to flush your tunneled central venous catheter each time you use it, both before and after you use it. Flushing your catheter will help prevent it from clogging. What are the risks? Risks may include: Infection. Air getting into the catheter and bloodstream. Supplies needed: A clean pair of gloves. A disinfecting wipe. Use an alcohol wipe, chlorhexidine wipe, or iodine wipe as told by your health care provider. A 10 mL syringe that has been prefilled with saline solution. An empty 10 mL syringe, if a substance called heparin was injected into your catheter. How to flush your catheter When you flush your catheter, make sure you follow any specific instructions from your health care provider or the manufacturer. These are general guidelines. Flushing your catheter before use If there is heparin in your catheter: Wash your hands with soap and water. Put on gloves. Scrub the injection cap for a minimum of 15 seconds with a disinfecting wipe. Unclamp the catheter. Attach the empty syringe to the injection cap. Pull the syringe plunger back and withdraw 10 mL of blood. Place the syringe into an appropriate waste container. Scrub the injection cap for 15 seconds with a disinfecting wipe. Attach the prefilled syringe to the injection cap. Flush the catheter by pushing the plunger forward until all the liquid from the syringe is in the catheter. Remove the syringe from the injection cap. Clamp the catheter. If there is no heparin in your catheter: Wash your hands with soap and water. Put on gloves. Scrub the injection cap for 15 seconds with a disinfecting wipe. Unclamp the catheter. Attach the prefilled syringe to the injection cap. Flush the catheter by pushing the plunger forward until 5 mL of the liquid from the syringe is in the catheter. Pull back on the syringe until you see blood in the catheter. If you have been asked  to collect any blood, follow your health care provider's instructions. Otherwise, flush the catheter with the rest of the solution from the syringe. Remove the syringe from the injection cap. Clamp the catheter.  Flushing your catheter after use Wash your hands with soap and water. Put on gloves. Scrub the injection cap for 15 seconds with a disinfecting wipe. Unclamp the catheter. Attach the prefilled syringe to the injection cap. Flush the catheter by pushing the plunger forward until all of the liquid from the syringe is in the catheter. Remove the syringe from the injection cap. Clamp the catheter. Problems and solutions If blood cannot be completely cleared from the injection cap, you may need to have the injection cap replaced. If the catheter is difficult to flush, use the pulsing method. The pulsing method involves pushing only a few milliliters of solution into the catheter at a time and pausing between pushes. If you do not see blood in the catheter when you pull back on the syringe, change your body position, such as by raising your arms above your head. Take a deep breath and cough. Then, pull back on the syringe. If you still do not see blood, flush the catheter with a small amount of solution. Then, change positions again and take a breath or cough. Pull back on the syringe again. If you still do not see blood, finish flushing the catheter and contact your health care provider. Do not use your catheter until your health care provider says it is okay. General tips Have someone help you flush your catheter, if possible. Do not force fluid   through your catheter. Do not use a syringe that is larger or smaller than 10 mL. Using a smaller syringe can make the catheter burst. Do not use your catheter without flushing it first if it has heparin in it. Contact a health care provider if: You cannot see any blood in the catheter when you flush it before using it. Your catheter is difficult  to flush. Get help right away if: You cannot flush the catheter. The catheter leaks when you flush it or when there is fluid in it. There are cracks or breaks in the catheter. Summary It is important to flush your tunneled central venous catheter each time you use it, both before and after you use it. Scrub the injection cap for 15 seconds with a disinfecting wipe before and after you flush it. When you flush your catheter, make sure you follow any specific instructions from your health care provider or the manufacturer. Get help right away if you cannot flush the catheter. This information is not intended to replace advice given to you by your health care provider. Make sure you discuss any questions you have with your health care provider. Document Revised: 03/30/2019 Document Reviewed: 04/06/2018 Elsevier Patient Education  2022 [employer]  

## 2020-09-05 NOTE — Patient Instructions (Signed)
Obinutuzumab injection What is this medication? OBINUTUZUMAB (OH bi nue TOOZ ue mab) is a monoclonal antibody. It is used to treat chronic lymphocytic leukemia (CLL) and a type of non-Hodgkin lymphoma(NHL), follicular lymphoma. This medicine may be used for other purposes; ask your health care provider orpharmacist if you have questions. COMMON BRAND NAME(S): GAZYVA What should I tell my care team before I take this medication? They need to know if you have any of these conditions: infection (especially a virus infection such as hepatitis B virus) lung or breathing disease heart disease take medicines that treat or prevent blood clots an unusual or allergic reaction to obinutuzumab, other medicines, foods, dyes, or preservatives pregnant or trying to get pregnant breast-feeding How should I use this medication? This medicine is for infusion into a vein. It is given by a health careprofessional in a hospital or clinic setting. Talk to your pediatrician regarding the use of this medicine in children.Special care may be needed. Overdosage: If you think you have taken too much of this medicine contact apoison control center or emergency room at once. NOTE: This medicine is only for you. Do not share this medicine with others. What if I miss a dose? Keep appointments for follow-up doses as directed. It is important not to miss your dose. Call your doctor or health care professional if you are unable tokeep an appointment. What may interact with this medication? live virus vaccines This list may not describe all possible interactions. Give your health care provider a list of all the medicines, herbs, non-prescription drugs, or dietary supplements you use. Also tell them if you smoke, drink alcohol, or use illegaldrugs. Some items may interact with your medicine. What should I watch for while using this medication? Report any side effects that you notice during your treatment right away, such as  changes in your breathing, fever, chills, dizziness or lightheadedness.These effects are more common with the first dose. Visit your prescriber or health care professional for checks on your progress. You will need to have regular blood work. Report any other side effects. The side effects of this medicine can continue after you finish your treatment. Continue your course of treatment even though you feel ill unless your doctortells you to stop. Call your doctor or health care professional for advice if you get a fever, chills or sore throat, or other symptoms of a cold or flu. Do not treat yourself. This drug decreases your body's ability to fight infections. Try toavoid being around people who are sick. This medicine may increase your risk to bruise or bleed. Call your doctor orhealth care professional if you notice any unusual bleeding. Do not become pregnant while taking this medicine or for 6 months after stopping it. Women should inform their doctor if they wish to become pregnant or think they might be pregnant. There is a potential for serious side effects to an unborn child. Talk to your health care professional or pharmacist for more information. Do not breast-feed an infant while taking this medicine orfor 6 months after stopping it. What side effects may I notice from receiving this medication? Side effects that you should report to your doctor or health care professionalas soon as possible: allergic reactions like skin rash, itching or hives, swelling of the face, lips, or tongue breathing problems changes in vision chest pain or chest tightness confusion dizziness loss of balance or coordination low blood counts - this medicine may decrease the number of white blood cells, red blood cells and  platelets. You may be at increased risk for infections and bleeding. signs of decreased platelets or bleeding - bruising, pinpoint red spots on the skin, black, tarry stools, blood in the  urine signs of infection - fever or chills, cough, sore throat, pain or trouble passing urine signs and symptoms of liver injury like dark yellow or brown urine; general ill feeling or flu-like symptoms; light-colored stools; loss of appetite; nausea; right upper belly pain; unusually weak or tired; yellowing of the eyes or skin trouble speaking or understanding trouble walking vomiting Side effects that usually do not require medical attention (report to yourdoctor or health care professional if they continue or are bothersome): constipation joint pain muscle pain This list may not describe all possible side effects. Call your doctor for medical advice about side effects. You may report side effects to FDA at1-800-FDA-1088. Where should I keep my medication? This drug is only given in a hospital or clinic and will not be stored at home. NOTE: This sheet is a summary. It may not cover all possible information. If you have questions about this medicine, talk to your doctor, pharmacist, orhealth care provider.  2022 Elsevier/Gold Standard (2018-05-03 15:34:53) Doxorubicin injection What is this medication? DOXORUBICIN (dox oh ROO bi sin) is a chemotherapy drug. It is used to treat many kinds of cancer like leukemia, lymphoma, neuroblastoma, sarcoma, and Wilms' tumor. It is also used to treat bladder cancer, breast cancer, lungcancer, ovarian cancer, stomach cancer, and thyroid cancer. This medicine may be used for other purposes; ask your health care provider orpharmacist if you have questions. COMMON BRAND NAME(S): Adriamycin, Adriamycin PFS, Adriamycin RDF, Rubex What should I tell my care team before I take this medication? They need to know if you have any of these conditions: heart disease history of low blood counts caused by a medicine liver disease recent or ongoing radiation therapy an unusual or allergic reaction to doxorubicin, other chemotherapy agents, other medicines, foods,  dyes, or preservatives pregnant or trying to get pregnant breast-feeding How should I use this medication? This drug is given as an infusion into a vein. It is administered in a hospital or clinic by a specially trained health care professional. If you have pain, swelling, burning or any unusual feeling around the site of your injection,tell your health care professional right away. Talk to your pediatrician regarding the use of this medicine in children.Special care may be needed. Overdosage: If you think you have taken too much of this medicine contact apoison control center or emergency room at once. NOTE: This medicine is only for you. Do not share this medicine with others. What if I miss a dose? It is important not to miss your dose. Call your doctor or health careprofessional if you are unable to keep an appointment. What may interact with this medication? This medicine may interact with the following medications: 6-mercaptopurine paclitaxel phenytoin [facility] John's Wort trastuzumab verapamil This list may not describe all possible interactions. Give your health care provider a list of all the medicines, herbs, non-prescription drugs, or dietary supplements you use. Also tell them if you smoke, drink alcohol, or use illegaldrugs. Some items may interact with your medicine. What should I watch for while using this medication? This drug may make you feel generally unwell. This is not uncommon, as chemotherapy can affect healthy cells as well as cancer cells. Report any side effects. Continue your course of treatment even though you feel ill unless yourdoctor tells you to stop. There is a  maximum amount of this medicine you should receive throughout your life. The amount depends on the medical condition being treated and your overall health. Your doctor will watch how much of this medicine you receive inyour lifetime. Tell your doctor if you have taken this medicine before. You may need blood  work done while you are taking this medicine. Your urine may turn red for a few days after your dose. This is not blood. Ifyour urine is dark or brown, call your doctor. In some cases, you may be given additional medicines to help with side effects.Follow all directions for their use. Call your doctor or health care professional for advice if you get a fever, chills or sore throat, or other symptoms of a cold or flu. Do not treat yourself. This drug decreases your body's ability to fight infections. Try toavoid being around people who are sick. This medicine may increase your risk to bruise or bleed. Call your doctor orhealth care professional if you notice any unusual bleeding. Talk to your doctor about your risk of cancer. You may be more at risk forcertain types of cancers if you take this medicine. Do not become pregnant while taking this medicine or for 6 months after stopping it. Women should inform their doctor if they wish to become pregnant or think they might be pregnant. Men should not father a child while taking this medicine and for 6 months after stopping it. There is a potential for serious side effects to an unborn child. Talk to your health care professional or pharmacist for more information. Do not breast-feed an infant while takingthis medicine. This medicine has caused ovarian failure in some women and reduced sperm counts in some men This medicine may interfere with the ability to have a child. Talk with your doctor or health care professional if you are concerned about yourfertility. This medicine may cause a decrease in Co-Enzyme Q-10. You should make sure that you get enough Co-Enzyme Q-10 while you are taking this medicine. Discuss thefoods you eat and the vitamins you take with your health care professional. What side effects may I notice from receiving this medication? Side effects that you should report to your doctor or health care professionalas soon as possible: allergic  reactions like skin rash, itching or hives, swelling of the face, lips, or tongue breathing problems chest pain fast or irregular heartbeat low blood counts - this medicine may decrease the number of white blood cells, red blood cells and platelets. You may be at increased risk for infections and bleeding. pain, redness, or irritation at site where injected signs of infection - fever or chills, cough, sore throat, pain or difficulty passing urine signs of decreased platelets or bleeding - bruising, pinpoint red spots on the skin, black, tarry stools, blood in the urine swelling of the ankles, feet, hands tiredness weakness Side effects that usually do not require medical attention (report to yourdoctor or health care professional if they continue or are bothersome): diarrhea hair loss mouth sores nail discoloration or damage nausea red colored urine vomiting This list may not describe all possible side effects. Call your doctor for medical advice about side effects. You may report side effects to FDA at1-800-FDA-1088. Where should I keep my medication? This drug is given in a hospital or clinic and will not be stored at home. NOTE: This sheet is a summary. It may not cover all possible information. If you have questions about this medicine, talk to your doctor, pharmacist, orhealth care provider.  2022 Elsevier/Gold Standard (2016-09-02 11:01:26) Cyclophosphamide Injection What is this medication? CYCLOPHOSPHAMIDE (sye kloe FOSS fa mide) is a chemotherapy drug. It slows the growth of cancer cells. This medicine is used to treat many types of cancer like lymphoma, myeloma, leukemia, breast cancer, and ovarian cancer, to name afew. This medicine may be used for other purposes; ask your health care provider orpharmacist if you have questions. COMMON BRAND NAME(S): Cytoxan, Neosar What should I tell my care team before I take this medication? They need to know if you have any of these  conditions: heart disease history of irregular heartbeat infection kidney disease liver disease low blood counts, like white cells, platelets, or red blood cells on hemodialysis recent or ongoing radiation therapy scarring or thickening of the lungs trouble passing urine an unusual or allergic reaction to cyclophosphamide, other medicines, foods, dyes, or preservatives pregnant or trying to get pregnant breast-feeding How should I use this medication? This drug is usually given as an injection into a vein or muscle or by infusion into a vein. It is administered in a hospital or clinic by a specially trainedhealth care professional. Talk to your pediatrician regarding the use of this medicine in children.Special care may be needed. Overdosage: If you think you have taken too much of this medicine contact apoison control center or emergency room at once. NOTE: This medicine is only for you. Do not share this medicine with others. What if I miss a dose? It is important not to miss your dose. Call your doctor or health careprofessional if you are unable to keep an appointment. What may interact with this medication? amphotericin B azathioprine certain antivirals for HIV or hepatitis certain medicines for blood pressure, heart disease, irregular heart beat certain medicines that treat or prevent blood clots like warfarin certain other medicines for cancer cyclosporine etanercept indomethacin medicines that relax muscles for surgery medicines to increase blood counts metronidazole This list may not describe all possible interactions. Give your health care provider a list of all the medicines, herbs, non-prescription drugs, or dietary supplements you use. Also tell them if you smoke, drink alcohol, or use illegaldrugs. Some items may interact with your medicine. What should I watch for while using this medication? Your condition will be monitored carefully while you are receiving  thismedicine. You may need blood work done while you are taking this medicine. Drink water or other fluids as directed. Urinate often, even at night. Some products may contain alcohol. Ask your health care professional if this medicine contains alcohol. Be sure to tell all health care professionals you are taking this medicine. Certain medicines, like metronidazole and disulfiram, can cause an unpleasant reaction when taken with alcohol. The reaction includes flushing, headache, nausea, vomiting, sweating, and increased thirst. Thereaction can last from 30 minutes to several hours. Do not become pregnant while taking this medicine or for 1 year after stopping it. Women should inform their health care professional if they wish to become pregnant or think they might be pregnant. Men should not father a child while taking this medicine and for 4 months after stopping it. There is potential for serious side effects to an unborn child. Talk to your health care professionalfor more information. Do not breast-feed an infant while taking this medicine or for 1 week afterstopping it. This medicine has caused ovarian failure in some women. This medicine may make it more difficult to get pregnant. Talk to your health care professional if Ventura Sellers concerned about your fertility. This medicine has  caused decreased sperm counts in some men. This may make it more difficult to father a child. Talk to your health care professional if Ventura Sellers concerned about your fertility. Call your health care professional for advice if you get a fever, chills, or sore throat, or other symptoms of a cold or flu. Do not treat yourself. This medicine decreases your body's ability to fight infections. Try to avoid beingaround people who are sick. Avoid taking medicines that contain aspirin, acetaminophen, ibuprofen, naproxen, or ketoprofen unless instructed by your health care professional.These medicines may hide a fever. Talk to your health  care professional about your risk of cancer. You may bemore at risk for certain types of cancer if you take this medicine. If you are going to need surgery or other procedure, tell your health careprofessional that you are using this medicine. Be careful brushing or flossing your teeth or using a toothpick because you may get an infection or bleed more easily. If you have any dental work done, Primary school teacher you are receiving this medicine. What side effects may I notice from receiving this medication? Side effects that you should report to your doctor or health care professionalas soon as possible: allergic reactions like skin rash, itching or hives, swelling of the face, lips, or tongue breathing problems nausea, vomiting signs and symptoms of bleeding such as bloody or black, tarry stools; red or dark brown urine; spitting up blood or brown material that looks like coffee grounds; red spots on the skin; unusual bruising or bleeding from the eyes, gums, or nose signs and symptoms of heart failure like fast, irregular heartbeat, sudden weight gain; swelling of the ankles, feet, hands signs and symptoms of infection like fever; chills; cough; sore throat; pain or trouble passing urine signs and symptoms of kidney injury like trouble passing urine or change in the amount of urine signs and symptoms of liver injury like dark yellow or brown urine; general ill feeling or flu-like symptoms; light-colored stools; loss of appetite; nausea; right upper belly pain; unusually weak or tired; yellowing of the eyes or skin Side effects that usually do not require medical attention (report to yourdoctor or health care professional if they continue or are bothersome): confusion decreased hearing diarrhea facial flushing hair loss headache loss of appetite missed menstrual periods signs and symptoms of low red blood cells or anemia such as unusually weak or tired; feeling faint or lightheaded; falls skin  discoloration This list may not describe all possible side effects. Call your doctor for medical advice about side effects. You may report side effects to FDA at1-800-FDA-1088. Where should I keep my medication? This drug is given in a hospital or clinic and will not be stored at home. NOTE: This sheet is a summary. It may not cover all possible information. If you have questions about this medicine, talk to your doctor, pharmacist, orhealth care provider.  2022 Elsevier/Gold Standard (2018-10-24 09:53:29)

## 2020-09-06 LAB — IGG, IGA, IGM
IgA: 112 mg/dL (ref 90–386)
IgG (Immunoglobin G), Serum: 576 mg/dL — ABNORMAL LOW (ref 603–1613)
IgM (Immunoglobulin M), Srm: 14 mg/dL — ABNORMAL LOW (ref 20–172)

## 2020-09-10 ENCOUNTER — Encounter (INDEPENDENT_AMBULATORY_CARE_PROVIDER_SITE_OTHER): Payer: Self-pay

## 2020-09-18 ENCOUNTER — Other Ambulatory Visit: Payer: Self-pay | Admitting: Hematology & Oncology

## 2020-09-18 ENCOUNTER — Ambulatory Visit (INDEPENDENT_AMBULATORY_CARE_PROVIDER_SITE_OTHER): Payer: BC Managed Care – PPO | Admitting: Physician Assistant

## 2020-09-18 DIAGNOSIS — C8223 Follicular lymphoma grade III, unspecified, intra-abdominal lymph nodes: Secondary | ICD-10-CM

## 2020-09-19 ENCOUNTER — Encounter (INDEPENDENT_AMBULATORY_CARE_PROVIDER_SITE_OTHER): Payer: Self-pay | Admitting: Family Medicine

## 2020-09-19 ENCOUNTER — Ambulatory Visit (INDEPENDENT_AMBULATORY_CARE_PROVIDER_SITE_OTHER): Payer: BC Managed Care – PPO | Admitting: Family Medicine

## 2020-09-19 ENCOUNTER — Other Ambulatory Visit: Payer: Self-pay

## 2020-09-19 VITALS — BP 131/74 | HR 87 | Temp 98.4°F | Ht 71.0 in | Wt 269.0 lb

## 2020-09-19 DIAGNOSIS — Z9189 Other specified personal risk factors, not elsewhere classified: Secondary | ICD-10-CM

## 2020-09-19 DIAGNOSIS — E785 Hyperlipidemia, unspecified: Secondary | ICD-10-CM

## 2020-09-19 DIAGNOSIS — E1169 Type 2 diabetes mellitus with other specified complication: Secondary | ICD-10-CM

## 2020-09-19 DIAGNOSIS — E1165 Type 2 diabetes mellitus with hyperglycemia: Secondary | ICD-10-CM

## 2020-09-19 DIAGNOSIS — Z6837 Body mass index (BMI) 37.0-37.9, adult: Secondary | ICD-10-CM

## 2020-09-19 MED ORDER — OZEMPIC (0.25 OR 0.5 MG/DOSE) 2 MG/1.5ML ~~LOC~~ SOPN: 0.5000 mg | PEN_INJECTOR | SUBCUTANEOUS | 0 refills | Status: DC

## 2020-09-20 ENCOUNTER — Other Ambulatory Visit (INDEPENDENT_AMBULATORY_CARE_PROVIDER_SITE_OTHER): Payer: Self-pay | Admitting: Physician Assistant

## 2020-09-20 DIAGNOSIS — E1169 Type 2 diabetes mellitus with other specified complication: Secondary | ICD-10-CM

## 2020-09-23 ENCOUNTER — Encounter (INDEPENDENT_AMBULATORY_CARE_PROVIDER_SITE_OTHER): Payer: Self-pay

## 2020-09-23 DIAGNOSIS — E1165 Type 2 diabetes mellitus with hyperglycemia: Secondary | ICD-10-CM

## 2020-09-23 NOTE — Progress Notes (Signed)
Chief Complaint:   OBESITY James Jennings is here to discuss his progress with his obesity treatment plan along with follow-up of his obesity related diagnoses. James Jennings is on the Category 4 Plan and states he is following his eating plan approximately 50% of the time. James Jennings states he rode his bike 5 miles 5 times per week.  Today's visit was #: 10 Starting weight: 277 lbs Starting date: 03/14/2020 Today's weight: 269 lbs Today's date: 09/19/2020 Total lbs lost to date: 8 Total lbs lost since last in-office visit: 0  Interim History: James Jennings just finished up chemo last month for recurrent NHL. He is finally starting to feel better after chemo. He just started riding his bike again and rode about 5 miles a day. Food wise, he wants to get back on plan. His biggest obstacle is fast food.  Subjective:   1. Type 2 diabetes mellitus with hyperglycemia, without long-term current use of insulin ([name]) James Jennings is on Metformin and Ozempic and denies GI side effects.  2. Hyperlipidemia associated with type 2 diabetes mellitus ([place]) Pt is on Lipitor. He denies myalgias or transaminitis. His last labs were 2 months ago.  3. At risk for side effect of medication James Jennings is at risk for side effects of medication due to being on Ozempic.  Assessment/Plan:   1. Type 2 diabetes mellitus with hyperglycemia, without long-term current use of insulin ([name]) Good blood sugar control is important to decrease the likelihood of diabetic complications such as nephropathy, neuropathy, limb loss, blindness, coronary artery disease, and death. Intensive lifestyle modification including diet, exercise and weight loss are the first line of treatment for diabetes.   Refill- Semaglutide,0.25 or 0.'5MG'$ /DOS, (OZEMPIC, 0.25 OR 0.5 MG/DOSE,) 2 MG/1.5ML SOPN; Inject 0.5 mg as directed once a week.  Dispense: 1.5 mL; Refill: 0  2. Hyperlipidemia associated with type 2 diabetes mellitus ([place]) Cardiovascular risk and specific lipid/LDL goals  reviewed.  We discussed several lifestyle modifications today and James Jennings will continue to work on diet, exercise and weight loss efforts. Orders and follow up as documented in patient record. Follow up labs in 3 months.  Counseling Intensive lifestyle modifications are the first line treatment for this issue. Dietary changes: Increase soluble fiber. Decrease simple carbohydrates. Exercise changes: Moderate to vigorous-intensity aerobic activity 150 minutes per week if tolerated. Lipid-lowering medications: see documented in medical record.  3. At risk for side effect of medication James Jennings was given approximately 15 minutes of drug side effect counseling today.  We discussed side effect possibility and risk versus benefits. James Jennings agreed to the medication and will contact this office if these side effects are intolerable.  Repetitive spaced learning was employed today to elicit superior memory formation and behavioral change.  4. Obesity with current BMI of 37.5  James Jennings is currently in the action stage of change. As such, his goal is to continue with weight loss efforts. He has agreed to the Category 4 Plan.   Exercise goals: All adults should avoid inactivity. Some physical activity is better than none, and adults who participate in any amount of physical activity gain some health benefits.  Behavioral modification strategies: increasing lean protein intake, meal planning and cooking strategies, and keeping healthy foods in the home.  James Jennings has agreed to follow-up with our clinic in 3 weeks. He was informed of the importance of frequent follow-up visits to maximize his success with intensive lifestyle modifications for his multiple health conditions.   Objective:   Blood pressure 131/74, pulse 87, temperature  98.4 F (36.9 C), height '5\' 11"'$  (1.803 m), weight 269 lb (122 kg), SpO2 97 %. Body mass index is 37.52 kg/m.  General: Cooperative, alert, well developed, in no acute distress. HEENT:  Conjunctivae and lids unremarkable. Cardiovascular: Regular rhythm.  Lungs: Normal work of breathing. Neurologic: No focal deficits.   Lab Results  Component Value Date   CREATININE 0.89 09/05/2020   BUN 19 09/05/2020   NA 141 09/05/2020   K 4.3 09/05/2020   CL 107 09/05/2020   CO2 26 09/05/2020   Lab Results  Component Value Date   ALT 22 09/05/2020   AST 21 09/05/2020   ALKPHOS 111 09/05/2020   BILITOT 0.4 09/05/2020   Lab Results  Component Value Date   HGBA1C 7.1 (H) 06/25/2020   HGBA1C 8.2 (H) 03/14/2020   HGBA1C 6.2 (H) 08/10/2018   Lab Results  Component Value Date   INSULIN 35.2 (H) 06/25/2020   INSULIN 28.0 (H) 03/14/2020   INSULIN 24.6 08/10/2018   Lab Results  Component Value Date   TSH 2.603 07/16/2020   Lab Results  Component Value Date   CHOL 148 06/25/2020   HDL 33 (L) 06/25/2020   LDLCALC 86 06/25/2020   TRIG 169 (H) 06/25/2020   CHOLHDL 4.5 06/25/2020   Lab Results  Component Value Date   VD25OH 42.9 06/25/2020   VD25OH 41.3 03/14/2020   VD25OH 51.2 08/10/2018   Lab Results  Component Value Date   WBC 3.8 (L) 09/05/2020   HGB 11.3 (L) 09/05/2020   HCT 33.3 (L) 09/05/2020   MCV 92.8 09/05/2020   PLT 210 09/05/2020   Attestation Statements:   Reviewed by clinician on day of visit: allergies, medications, problem list, medical history, surgical history, family history, social history, and previous encounter notes.  Coral Ceo, CMA, am acting as transcriptionist for Coralie Common, MD.  I have reviewed the above documentation for accuracy and completeness, and I agree with the above. - Coralie Common, MD

## 2020-09-23 NOTE — Telephone Encounter (Signed)
Message sent to pt-CAS 

## 2020-09-30 ENCOUNTER — Other Ambulatory Visit (INDEPENDENT_AMBULATORY_CARE_PROVIDER_SITE_OTHER): Payer: Self-pay | Admitting: Family Medicine

## 2020-09-30 DIAGNOSIS — E1169 Type 2 diabetes mellitus with other specified complication: Secondary | ICD-10-CM

## 2020-09-30 MED ORDER — METFORMIN HCL 500 MG PO TABS: 500.0000 mg | ORAL_TABLET | Freq: Every day | ORAL | 0 refills | Status: DC

## 2020-09-30 NOTE — Telephone Encounter (Signed)
Please advise about refill of metformin  LAST APPOINTMENT DATE: 09/19/20 NEXT APPOINTMENT DATE: 10/28/20   [id number] [facility] [address] [place]Phone: [phone]: [phone] [place]Phone: [phone]: [phone] Patient is requesting a refill of the following medications: No prescriptions requested or ordered in this encounter   Date last filled: 06/24/20 #90 day supply Previously prescribed by TAbby Potash PA-C  Lab Results      Component                Value               Date                      HGBA1C                   7.1 (H)             06/25/2020                HGBA1C                   8.2 (H)             03/14/2020                HGBA1C                   6.2 (H)             08/10/2018           Lab Results      Component                Value               Date                      LDLCALC                  86                  06/25/2020                CREATININE               0.89                09/05/2020           Lab Results      Component                Value               Date                      VD25OH                   42.9                06/25/2020                VD25OH                   41.3                03/14/2020  VD25OH                   51.2                08/10/2018            BP Readings from Last 3 Encounters: 09/19/20 : 131/74 09/05/20 : 121/76 08/27/20 : 109/74

## 2020-10-03 ENCOUNTER — Telehealth: Payer: Self-pay | Admitting: *Deleted

## 2020-10-03 NOTE — Telephone Encounter (Signed)
Received a call from Haik stating that his son has tested positive for Covid and is now experiencing symptoms.  Advised patient to call his primary care physician to see about possible antiviral medication for Covid.  Patient agrees and will call PCP today.

## 2020-10-22 MED ORDER — OZEMPIC (0.25 OR 0.5 MG/DOSE) 2 MG/1.5ML ~~LOC~~ SOPN: 0.2500 mg | PEN_INJECTOR | SUBCUTANEOUS | 0 refills | Status: DC

## 2020-10-22 NOTE — Telephone Encounter (Signed)
LAST APPOINTMENT DATE: 09/19/20 NEXT APPOINTMENT DATE: 10/28/20   [id number] [place] [place] - [address] [place] [address] Phone: [phone] Fax: [phone]  [place] [address] Phone: [phone] Fax: [phone]  Patient is requesting a refill of the following medications: Requested Prescriptions   Pending Prescriptions Disp Refills   Semaglutide,0.25 or 0.5MG /DOS, (OZEMPIC, 0.25 OR 0.5 MG/DOSE,) 2 MG/1.5ML SOPN      Sig: Inject 0.5 mg into the skin once a week.    Date last filled: 09/19/20 Previously prescribed by Dr Jearld Shines  Lab Results  Component Value Date   HGBA1C 7.1 (H) 06/25/2020   HGBA1C 8.2 (H) 03/14/2020   HGBA1C 6.2 (H) 08/10/2018   Lab Results  Component Value Date   LDLCALC 86 06/25/2020   CREATININE 0.89 09/05/2020   Lab Results  Component Value Date   VD25OH 42.9 06/25/2020   VD25OH 41.3 03/14/2020   VD25OH 51.2 08/10/2018    BP Readings from Last 3 Encounters:  09/19/20 131/74  09/05/20 121/76  08/27/20 109/74

## 2020-10-28 ENCOUNTER — Other Ambulatory Visit: Payer: Self-pay

## 2020-10-28 ENCOUNTER — Ambulatory Visit (INDEPENDENT_AMBULATORY_CARE_PROVIDER_SITE_OTHER): Payer: BC Managed Care – PPO | Admitting: Family Medicine

## 2020-10-28 ENCOUNTER — Encounter (INDEPENDENT_AMBULATORY_CARE_PROVIDER_SITE_OTHER): Payer: Self-pay | Admitting: Family Medicine

## 2020-10-28 VITALS — BP 120/78 | HR 85 | Temp 98.1°F | Ht 71.0 in | Wt 270.0 lb

## 2020-10-28 DIAGNOSIS — E1169 Type 2 diabetes mellitus with other specified complication: Secondary | ICD-10-CM

## 2020-10-28 DIAGNOSIS — E1165 Type 2 diabetes mellitus with hyperglycemia: Secondary | ICD-10-CM | POA: Diagnosis not present

## 2020-10-28 DIAGNOSIS — Z6837 Body mass index (BMI) 37.0-37.9, adult: Secondary | ICD-10-CM

## 2020-10-28 DIAGNOSIS — E785 Hyperlipidemia, unspecified: Secondary | ICD-10-CM

## 2020-10-28 NOTE — Progress Notes (Signed)
Chief Complaint:   OBESITY James Jennings is here to discuss his progress with his obesity treatment plan along with follow-up of his obesity related diagnoses. James Jennings is on the Category 4 Plan and states he is following his eating plan approximately 75% of the time. James Jennings states he is walking or riding bike 30-60 minutes 2-3 times per week.  Today's visit was #: 11 Starting weight: 277 lbs Starting date: 03/14/2020 Today's weight: 270 lbs Today's date: 10/28/2020 Total lbs lost to date: 7 Total lbs lost since last in-office visit: 0  Interim History: 22 of James Jennings's family had COVID over the last few weeks. He is trying to get more walking in. He also has a few celebrations over this past weekend for passing a test and his son being away for the weekend. Dinner still tends to be a struggle in terms of quantity of protein.   Subjective:   1. Type 2 diabetes mellitus with hyperglycemia, without long-term current use of insulin ([name]) James Jennings is on Ozempic 0.5 mg and denies side effects or issues. His last A1c was 7.1 and insulin level 35.2.  2. Hyperlipidemia associated with type 2 diabetes mellitus (James Jennings) Pt is on Lipitor with no side effects. His last labs were at the end of May.  Assessment/Plan:   1. Type 2 diabetes mellitus with hyperglycemia, without long-term current use of insulin ([name]) Good blood sugar control is important to decrease the likelihood of diabetic complications such as nephropathy, neuropathy, limb loss, blindness, coronary artery disease, and death. Intensive lifestyle modification including diet, exercise and weight loss are the first line of treatment for diabetes. Continue Ozempic 0.5 mg weekly.  2. Hyperlipidemia associated with type 2 diabetes mellitus ([place]) Cardiovascular risk and specific lipid/LDL goals reviewed.  We discussed several lifestyle modifications today and James Jennings will continue to work on diet, exercise and weight loss efforts. Orders and follow up as  documented in patient record. Continue Lipitor with no change in dose.  Counseling Intensive lifestyle modifications are the first line treatment for this issue. Dietary changes: Increase soluble fiber. Decrease simple carbohydrates. Exercise changes: Moderate to vigorous-intensity aerobic activity 150 minutes per week if tolerated. Lipid-lowering medications: see documented in medical record.  3. Obesity with current BMI of 37.7  James Jennings is currently in the action stage of change. As such, his goal is to continue with weight loss efforts. He has agreed to the Category 4 Plan.   Exercise goals:  Do some purposeful activity 3-4 times a week.  Behavioral modification strategies: increasing lean protein intake, meal planning and cooking strategies, and keeping healthy foods in the home.  James Jennings has agreed to follow-up with our clinic in 3 weeks. He was informed of the importance of frequent follow-up visits to maximize his success with intensive lifestyle modifications for his multiple health conditions.   Objective:   Blood pressure 120/78, pulse 85, temperature 98.1 F (36.7 C), height 5\' 11"  (1.803 m), weight 270 lb (122.5 kg), SpO2 96 %. Body mass index is 37.66 kg/m.  General: Cooperative, alert, well developed, in no acute distress. HEENT: Conjunctivae and lids unremarkable. Cardiovascular: Regular rhythm.  Lungs: Normal work of breathing. Neurologic: No focal deficits.   Lab Results  Component Value Date   CREATININE 0.89 09/05/2020   BUN 19 09/05/2020   NA 141 09/05/2020   K 4.3 09/05/2020   CL 107 09/05/2020   CO2 26 09/05/2020   Lab Results  Component Value Date   ALT 22 09/05/2020  AST 21 09/05/2020   ALKPHOS 111 09/05/2020   BILITOT 0.4 09/05/2020   Lab Results  Component Value Date   HGBA1C 7.1 (H) 06/25/2020   HGBA1C 8.2 (H) 03/14/2020   HGBA1C 6.2 (H) 08/10/2018   Lab Results  Component Value Date   INSULIN 35.2 (H) 06/25/2020   INSULIN 28.0 (H)  03/14/2020   INSULIN 24.6 08/10/2018   Lab Results  Component Value Date   TSH 2.603 07/16/2020   Lab Results  Component Value Date   CHOL 148 06/25/2020   HDL 33 (L) 06/25/2020   LDLCALC 86 06/25/2020   TRIG 169 (H) 06/25/2020   CHOLHDL 4.5 06/25/2020   Lab Results  Component Value Date   VD25OH 42.9 06/25/2020   VD25OH 41.3 03/14/2020   VD25OH 51.2 08/10/2018   Lab Results  Component Value Date   WBC 3.8 (L) 09/05/2020   HGB 11.3 (L) 09/05/2020   HCT 33.3 (L) 09/05/2020   MCV 92.8 09/05/2020   PLT 210 09/05/2020    Attestation Statements:   Reviewed by clinician on day of visit: allergies, medications, problem list, medical history, surgical history, family history, social history, and previous encounter notes.  Time spent on visit including pre-visit chart review and post-visit care and charting was 15 minutes.   Coral Ceo, CMA, am acting as transcriptionist for Coralie Common, MD.   I have reviewed the above documentation for accuracy and completeness, and I agree with the above. - Coralie Common, MD

## 2020-10-30 ENCOUNTER — Other Ambulatory Visit: Payer: Self-pay | Admitting: *Deleted

## 2020-10-30 DIAGNOSIS — C8223 Follicular lymphoma grade III, unspecified, intra-abdominal lymph nodes: Secondary | ICD-10-CM

## 2020-10-31 ENCOUNTER — Encounter: Payer: Self-pay | Admitting: Hematology & Oncology

## 2020-10-31 ENCOUNTER — Inpatient Hospital Stay: Payer: BC Managed Care – PPO | Attending: Hematology & Oncology

## 2020-10-31 ENCOUNTER — Other Ambulatory Visit: Payer: Self-pay

## 2020-10-31 ENCOUNTER — Inpatient Hospital Stay (HOSPITAL_BASED_OUTPATIENT_CLINIC_OR_DEPARTMENT_OTHER): Payer: BC Managed Care – PPO | Admitting: Hematology & Oncology

## 2020-10-31 ENCOUNTER — Encounter: Payer: Self-pay | Admitting: *Deleted

## 2020-10-31 ENCOUNTER — Inpatient Hospital Stay: Payer: BC Managed Care – PPO

## 2020-10-31 VITALS — BP 126/82 | HR 80 | Temp 98.4°F | Resp 18

## 2020-10-31 VITALS — BP 117/67 | HR 85 | Temp 97.3°F | Resp 18 | Wt 275.0 lb

## 2020-10-31 DIAGNOSIS — Z7984 Long term (current) use of oral hypoglycemic drugs: Secondary | ICD-10-CM | POA: Diagnosis not present

## 2020-10-31 DIAGNOSIS — E119 Type 2 diabetes mellitus without complications: Secondary | ICD-10-CM | POA: Insufficient documentation

## 2020-10-31 DIAGNOSIS — Z79899 Other long term (current) drug therapy: Secondary | ICD-10-CM | POA: Diagnosis not present

## 2020-10-31 DIAGNOSIS — C8223 Follicular lymphoma grade III, unspecified, intra-abdominal lymph nodes: Secondary | ICD-10-CM | POA: Diagnosis not present

## 2020-10-31 DIAGNOSIS — Z5112 Encounter for antineoplastic immunotherapy: Secondary | ICD-10-CM | POA: Diagnosis not present

## 2020-10-31 LAB — COMPREHENSIVE METABOLIC PANEL
ALT: 20 U/L (ref 0–44)
AST: 19 U/L (ref 15–41)
Albumin: 4.3 g/dL (ref 3.5–5.0)
Alkaline Phosphatase: 113 U/L (ref 38–126)
Anion gap: 8 (ref 5–15)
BUN: 19 mg/dL (ref 6–20)
CO2: 27 mmol/L (ref 22–32)
Calcium: 9.2 mg/dL (ref 8.9–10.3)
Chloride: 104 mmol/L (ref 98–111)
Creatinine, Ser: 0.98 mg/dL (ref 0.61–1.24)
GFR, Estimated: >60 mL/min (ref >60)
Glucose, Bld: 112 mg/dL — ABNORMAL HIGH (ref 70–99)
Potassium: 4.3 mmol/L (ref 3.5–5.1)
Sodium: 139 mmol/L (ref 135–145)
Total Bilirubin: 0.5 mg/dL (ref 0.3–1.2)
Total Protein: 6.7 g/dL (ref 6.5–8.1)

## 2020-10-31 LAB — CBC WITH DIFFERENTIAL (CANCER CENTER ONLY)
Abs Immature Granulocytes: 0.01 10*3/uL (ref 0.00–0.07)
Basophils Absolute: 0 10*3/uL (ref 0.0–0.1)
Basophils Relative: 0 %
Eosinophils Absolute: 0.2 10*3/uL (ref 0.0–0.5)
Eosinophils Relative: 3 %
HCT: 37.6 % — ABNORMAL LOW (ref 39.0–52.0)
Hemoglobin: 12.7 g/dL — ABNORMAL LOW (ref 13.0–17.0)
Immature Granulocytes: 0 %
Lymphocytes Relative: 14 %
Lymphs Abs: 0.7 10*3/uL (ref 0.7–4.0)
MCH: 30.1 pg (ref 26.0–34.0)
MCHC: 33.8 g/dL (ref 30.0–36.0)
MCV: 89.1 fL (ref 80.0–100.0)
Monocytes Absolute: 0.7 10*3/uL (ref 0.1–1.0)
Monocytes Relative: 14 %
Neutro Abs: 3.4 10*3/uL (ref 1.7–7.7)
Neutrophils Relative %: 69 %
Platelet Count: 197 10*3/uL (ref 150–400)
RBC: 4.22 MIL/uL (ref 4.22–5.81)
RDW: 13 % (ref 11.5–15.5)
WBC Count: 5 10*3/uL (ref 4.0–10.5)
nRBC: 0 % (ref 0.0–0.2)

## 2020-10-31 LAB — LACTATE DEHYDROGENASE: LDH: 151 U/L (ref 98–192)

## 2020-10-31 MED ORDER — HEPARIN SOD (PORK) LOCK FLUSH 100 UNIT/ML IV SOLN
500.0000 [IU] | Freq: Once | INTRAVENOUS | Status: AC | PRN
  Administered 2020-10-31: 500 [IU]

## 2020-10-31 MED ORDER — SODIUM CHLORIDE 0.9% FLUSH
10.0000 mL | INTRAVENOUS | Status: DC | PRN
  Administered 2020-10-31: 10 mL

## 2020-10-31 MED ORDER — SODIUM CHLORIDE 0.9 % IV SOLN
1000.0000 mg | Freq: Once | INTRAVENOUS | Status: AC
  Administered 2020-10-31: 1000 mg via INTRAVENOUS
  Filled 2020-10-31: qty 40

## 2020-10-31 MED ORDER — SODIUM CHLORIDE 0.9 % IV SOLN: Freq: Once | INTRAVENOUS | Status: AC

## 2020-10-31 MED ORDER — SODIUM CHLORIDE 0.9 % IV SOLN
40.0000 mg | Freq: Once | INTRAVENOUS | Status: AC
  Administered 2020-10-31: 40 mg via INTRAVENOUS
  Filled 2020-10-31: qty 4

## 2020-10-31 MED ORDER — SODIUM CHLORIDE 0.9% FLUSH
10.0000 mL | Freq: Once | INTRAVENOUS | Status: AC
  Administered 2020-10-31: 10 mL via INTRAVENOUS

## 2020-10-31 MED ORDER — ACETAMINOPHEN 325 MG PO TABS
650.0000 mg | ORAL_TABLET | Freq: Once | ORAL | Status: AC
  Administered 2020-10-31: 650 mg via ORAL
  Filled 2020-10-31: qty 2

## 2020-10-31 MED ORDER — DIPHENHYDRAMINE HCL 50 MG/ML IJ SOLN
12.5000 mg | Freq: Once | INTRAMUSCULAR | Status: AC
  Administered 2020-10-31: 12.5 mg via INTRAVENOUS
  Filled 2020-10-31: qty 1

## 2020-10-31 NOTE — Patient Instructions (Signed)
Tunneled Central Venous Catheter Flushing Guide It is important to flush your tunneled central venous catheter each time you use it, both before and after you use it. Flushing your catheter will help prevent it from clogging. What are the risks? Risks may include: Infection. Air getting into the catheter and bloodstream. Supplies needed: A clean pair of gloves. A disinfecting wipe. Use an alcohol wipe, chlorhexidine wipe, or iodine wipe as told by your health care provider. A 10 mL syringe that has been prefilled with saline solution. An empty 10 mL syringe, if a substance called heparin was injected into your catheter. How to flush your catheter When you flush your catheter, make sure you follow any specific instructions from your health care provider or the manufacturer. These are general guidelines. Flushing your catheter before use If there is heparin in your catheter: Wash your hands with soap and water. Put on gloves. Scrub the injection cap for a minimum of 15 seconds with a disinfecting wipe. Unclamp the catheter. Attach the empty syringe to the injection cap. Pull the syringe plunger back and withdraw 10 mL of blood. Place the syringe into an appropriate waste container. Scrub the injection cap for 15 seconds with a disinfecting wipe. Attach the prefilled syringe to the injection cap. Flush the catheter by pushing the plunger forward until all the liquid from the syringe is in the catheter. Remove the syringe from the injection cap. Clamp the catheter. If there is no heparin in your catheter: Wash your hands with soap and water. Put on gloves. Scrub the injection cap for 15 seconds with a disinfecting wipe. Unclamp the catheter. Attach the prefilled syringe to the injection cap. Flush the catheter by pushing the plunger forward until 5 mL of the liquid from the syringe is in the catheter. Pull back on the syringe until you see blood in the catheter. If you have been asked  to collect any blood, follow your health care provider's instructions. Otherwise, flush the catheter with the rest of the solution from the syringe. Remove the syringe from the injection cap. Clamp the catheter.  Flushing your catheter after use Wash your hands with soap and water. Put on gloves. Scrub the injection cap for 15 seconds with a disinfecting wipe. Unclamp the catheter. Attach the prefilled syringe to the injection cap. Flush the catheter by pushing the plunger forward until all of the liquid from the syringe is in the catheter. Remove the syringe from the injection cap. Clamp the catheter. Problems and solutions If blood cannot be completely cleared from the injection cap, you may need to have the injection cap replaced. If the catheter is difficult to flush, use the pulsing method. The pulsing method involves pushing only a few milliliters of solution into the catheter at a time and pausing between pushes. If you do not see blood in the catheter when you pull back on the syringe, change your body position, such as by raising your arms above your head. Take a deep breath and cough. Then, pull back on the syringe. If you still do not see blood, flush the catheter with a small amount of solution. Then, change positions again and take a breath or cough. Pull back on the syringe again. If you still do not see blood, finish flushing the catheter and contact your health care provider. Do not use your catheter until your health care provider says it is okay. General tips Have someone help you flush your catheter, if possible. Do not force fluid   through your catheter. Do not use a syringe that is larger or smaller than 10 mL. Using a smaller syringe can make the catheter burst. Do not use your catheter without flushing it first if it has heparin in it. Contact a health care provider if: You cannot see any blood in the catheter when you flush it before using it. Your catheter is difficult  to flush. Get help right away if: You cannot flush the catheter. The catheter leaks when you flush it or when there is fluid in it. There are cracks or breaks in the catheter. Summary It is important to flush your tunneled central venous catheter each time you use it, both before and after you use it. Scrub the injection cap for 15 seconds with a disinfecting wipe before and after you flush it. When you flush your catheter, make sure you follow any specific instructions from your health care provider or the manufacturer. Get help right away if you cannot flush the catheter. This information is not intended to replace advice given to you by your health care provider. Make sure you discuss any questions you have with your health care provider. Document Revised: 03/30/2019 Document Reviewed: 04/06/2018 Elsevier Patient Education  2022 [employer]  
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
## 2020-10-31 NOTE — Patient Instructions (Signed)
[facility]  Discharge Instructions: Thank you for choosing [facility] to provide your oncology and hematology care.   If you have a lab appointment with the [facility], please go directly to the [facility] and check in at the registration area.  Wear comfortable clothing and clothing appropriate for easy access to any Portacath or PICC line.   We strive to give you quality time with your provider. You may need to reschedule your appointment if you arrive late (15 or more minutes).  Arriving late affects you and other patients whose appointments are after yours.  Also, if you miss three or more appointments without notifying the office, you may be dismissed from the clinic at the provider's discretion.      For prescription refill requests, have your pharmacy contact our office and allow 72 hours for refills to be completed.    Today you received the following chemotherapy and/or immunotherapy agents gazyva    To help prevent nausea and vomiting after your treatment, we encourage you to take your nausea medication as directed.  BELOW ARE SYMPTOMS THAT SHOULD BE REPORTED IMMEDIATELY: *FEVER GREATER THAN 100.4 F (38 C) OR HIGHER *CHILLS OR SWEATING *NAUSEA AND VOMITING THAT IS NOT CONTROLLED WITH YOUR NAUSEA MEDICATION *UNUSUAL SHORTNESS OF BREATH *UNUSUAL BRUISING OR BLEEDING *URINARY PROBLEMS (pain or burning when urinating, or frequent urination) *BOWEL PROBLEMS (unusual diarrhea, constipation, pain near the anus) TENDERNESS IN MOUTH AND THROAT WITH OR WITHOUT PRESENCE OF ULCERS (sore throat, sores in mouth, or a toothache) UNUSUAL RASH, SWELLING OR PAIN  UNUSUAL VAGINAL DISCHARGE OR ITCHING   Items with * indicate a potential emergency and should be followed up as soon as possible or go to the Emergency Department if any problems should occur.  Please show the CHEMOTHERAPY ALERT CARD or IMMUNOTHERAPY ALERT CARD at check-in to the  Emergency Department and triage nurse. Should you have questions after your visit or need to cancel or reschedule your appointment, please contact [facility]  [phone] and follow the prompts.  Office hours are 8:00 a.m. to 4:30 p.m. Monday - Friday. Please note that voicemails left after 4:00 p.m. may not be returned until the following business day.  We are closed weekends and major holidays. You have access to a nurse at all times for urgent questions. Please call the main number to the clinic [phone] and follow the prompts.  For any non-urgent questions, you may also contact your provider using MyChart. We now offer e-Visits for anyone 43 and older to request care online for non-urgent symptoms. For details visit [email or web address].   Also download the MyChart app! Go to the app store, search "MyChart", open the app, select [facility], and log in with your MyChart username and password.  Due to Covid, a mask is required upon entering the hospital/clinic. If you do not have a mask, one will be given to you upon arrival. For doctor visits, patients may have 1 support person aged 59 or older with them. For treatment visits, patients cannot have anyone with them due to current Covid guidelines and our immunocompromised population.

## 2020-11-19 ENCOUNTER — Ambulatory Visit (INDEPENDENT_AMBULATORY_CARE_PROVIDER_SITE_OTHER): Payer: BC Managed Care – PPO | Admitting: Family Medicine

## 2020-11-20 ENCOUNTER — Other Ambulatory Visit (INDEPENDENT_AMBULATORY_CARE_PROVIDER_SITE_OTHER): Payer: Self-pay | Admitting: Family Medicine

## 2020-11-20 DIAGNOSIS — E1165 Type 2 diabetes mellitus with hyperglycemia: Secondary | ICD-10-CM

## 2020-11-20 NOTE — Telephone Encounter (Signed)
LAST APPOINTMENT DATE: 10/28/20 NEXT APPOINTMENT DATE: 12/04/20   [id number] [place], [place] - [address] [facility] [place] [address] [place] [address] Phone: [phone] Fax: [phone]  [place] [address] Phone: [phone] Fax: [phone]  Patient is requesting a refill of the following medications: Requested Prescriptions   Pending Prescriptions Disp Refills   OZEMPIC, 0.25 OR 0.5 MG/DOSE, 2 MG/1.5ML SOPN [Pharmacy Med Name: OZEMPIC 0.25-0.5 MG/DOSE PEN]      Sig: INJECT 0.25MG  INTO THE SKIN ONE TIME PER WEEK    Date last filled: 10/22/20 Previously prescribed by Dr. Jearld Shines  Lab Results  Component Value Date   HGBA1C 7.1 (H) 06/25/2020   HGBA1C 8.2 (H) 03/14/2020   HGBA1C 6.2 (H) 08/10/2018   Lab Results  Component Value Date   LDLCALC 86 06/25/2020   CREATININE 0.98 10/31/2020   Lab Results  Component Value Date   VD25OH 42.9 06/25/2020   VD25OH 41.3 03/14/2020   VD25OH 51.2 08/10/2018    BP Readings from Last 3 Encounters:  10/31/20 126/82  10/31/20 117/67  10/28/20 120/78

## 2020-11-21 ENCOUNTER — Other Ambulatory Visit (INDEPENDENT_AMBULATORY_CARE_PROVIDER_SITE_OTHER): Payer: Self-pay | Admitting: Family Medicine

## 2020-11-21 DIAGNOSIS — E1165 Type 2 diabetes mellitus with hyperglycemia: Secondary | ICD-10-CM

## 2020-11-25 NOTE — Telephone Encounter (Signed)
LAST APPOINTMENT DATE: 10/28/20 NEXT APPOINTMENT DATE: 12/04/20   [id number] [place], [place] - [address] [facility] [place] [address] [place] [address] Phone: [phone] Fax: [phone]  [place] [address] Phone: [phone] Fax: [phone]  Patient is requesting a refill of the following medications: Pending Prescriptions:                       Disp   Refills   Semaglutide,0.25 or 0.5MG /DOS, (OZEMPIC, 0*1.5 mL 0       Sig: Inject 0.5 mg as directed once a week.   Date last filled: 10/22/20 Previously prescribed by Dr Jearld Shines  Lab Results      Component                Value               Date                      HGBA1C                   7.1 (H)             06/25/2020                HGBA1C                   8.2 (H)             03/14/2020                HGBA1C                   6.2 (H)             08/10/2018           Lab Results      Component                Value               Date                      LDLCALC                  86                  06/25/2020                CREATININE               0.98                10/31/2020           Lab Results      Component                Value               Date                      VD25OH                   42.9                06/25/2020                VD25OH  41.3                03/14/2020                VD25OH                   51.2                08/10/2018            BP Readings from Last 3 Encounters: 10/31/20 : 126/82 10/31/20 : 117/67 10/28/20 : 120/78

## 2020-11-25 NOTE — Telephone Encounter (Signed)
Dr.Ukleja 

## 2020-11-26 ENCOUNTER — Encounter (INDEPENDENT_AMBULATORY_CARE_PROVIDER_SITE_OTHER): Payer: Self-pay | Admitting: Family Medicine

## 2020-11-26 ENCOUNTER — Telehealth: Payer: Self-pay

## 2020-11-26 MED ORDER — OZEMPIC (0.25 OR 0.5 MG/DOSE) 2 MG/1.5ML ~~LOC~~ SOPN: 0.5000 mg | PEN_INJECTOR | SUBCUTANEOUS | 0 refills | Status: DC

## 2020-11-26 NOTE — Telephone Encounter (Signed)
Patient called inquiring if his PET scan was ready to be scheduled. Called patient back and gave him central schedulings number that it appeared to be ready to be scheduled. Pt confirmed and will call.

## 2020-11-26 NOTE — Telephone Encounter (Signed)
Needs refill on Ozempic 0.5mg   LAST APPOINTMENT DATE: 10/28/20 NEXT APPOINTMENT DATE: 12/04/20   [id number] [place], [place] - [address] [facility] [address] [facility] [address] [place] [address] Phone: [phone] Fax: [phone]  [place] [address] Phone: [phone] Fax: [phone]  Patient is requesting a refill of the following medications: No prescriptions requested or ordered in this encounter   Date last filled: 10/22/20 Previously prescribed by Dr Jearld Shines  Lab Results      Component                Value               Date                      HGBA1C                   7.1 (H)             06/25/2020                HGBA1C                   8.2 (H)             03/14/2020                HGBA1C                   6.2 (H)             08/10/2018           Lab Results      Component                Value               Date                      LDLCALC                  86                  06/25/2020                CREATININE               0.98                10/31/2020           Lab Results      Component                Value               Date                      VD25OH                   42.9                06/25/2020                VD25OH                   41.3                03/14/2020  VD25OH                   51.2                08/10/2018            BP Readings from Last 3 Encounters: 10/31/20 : 126/82 10/31/20 : 117/67 10/28/20 : 120/78

## 2020-12-04 ENCOUNTER — Other Ambulatory Visit: Payer: Self-pay

## 2020-12-04 ENCOUNTER — Encounter (INDEPENDENT_AMBULATORY_CARE_PROVIDER_SITE_OTHER): Payer: Self-pay | Admitting: Family Medicine

## 2020-12-04 ENCOUNTER — Ambulatory Visit (INDEPENDENT_AMBULATORY_CARE_PROVIDER_SITE_OTHER): Payer: BC Managed Care – PPO | Admitting: Family Medicine

## 2020-12-04 VITALS — BP 116/75 | HR 89 | Temp 98.2°F | Ht 71.0 in | Wt 269.0 lb

## 2020-12-04 DIAGNOSIS — E1165 Type 2 diabetes mellitus with hyperglycemia: Secondary | ICD-10-CM | POA: Diagnosis not present

## 2020-12-04 DIAGNOSIS — Z6838 Body mass index (BMI) 38.0-38.9, adult: Secondary | ICD-10-CM

## 2020-12-04 DIAGNOSIS — E7849 Other hyperlipidemia: Secondary | ICD-10-CM | POA: Diagnosis not present

## 2020-12-04 MED ORDER — ATORVASTATIN CALCIUM 80 MG PO TABS: 80.0000 mg | ORAL_TABLET | Freq: Every day | ORAL | 0 refills | Status: DC

## 2020-12-05 NOTE — Progress Notes (Signed)
Chief Complaint:   OBESITY James Jennings is here to discuss his progress with his obesity treatment plan along with follow-up of his obesity related diagnoses. James Jennings is on the Category 4 Plan and states he is following his eating plan approximately 65-70% of the time. James Jennings states he is walking 30 minutes 5 times per week.  Today's visit was #: 12 Starting weight: 277 lbs Starting date: 03/14/2020 Today's weight: 269 lbs Today's date: 12/04/2020 Total lbs lost to date: 8 Total lbs lost since last in-office visit: 1  Interim History: James Jennings has had work, travel, and seeing friends and family. He started to get more focused on meal plan over the past weekend. He did get to go out trick or treating with his son. James Jennings has no plans in the next few weeks.  Subjective:   1. Other hyperlipidemia Marwin denies myalgias.  2. Type 2 diabetes mellitus with hyperglycemia, without long-term current use of insulin ([name]) Pt is on Ozempic and denies GI side effects and is feeling well.  Assessment/Plan:   1. Other hyperlipidemia Cardiovascular risk and specific lipid/LDL goals reviewed.  We discussed several lifestyle modifications today and Aydden will continue to work on diet, exercise and weight loss efforts. Orders and follow up as documented in patient record.   Counseling Intensive lifestyle modifications are the first line treatment for this issue. Dietary changes: Increase soluble fiber. Decrease simple carbohydrates. Exercise changes: Moderate to vigorous-intensity aerobic activity 150 minutes per week if tolerated. Lipid-lowering medications: see documented in medical record.  Refill- atorvastatin (LIPITOR) 80 MG tablet; Take 1 tablet (80 mg total) by mouth daily.  Dispense: 90 tablet; Refill: 0  2. Type 2 diabetes mellitus with hyperglycemia, without long-term current use of insulin ([name]) Good blood sugar control is important to decrease the likelihood of diabetic complications such as  nephropathy, neuropathy, limb loss, blindness, coronary artery disease, and death. Intensive lifestyle modification including diet, exercise and weight loss are the first line of treatment for diabetes. Continue Ozempic 0.5 mg as directed.  3. Obesity BMI today is 19  James Jennings is currently in the action stage of change. As such, his goal is to continue with weight loss efforts. He has agreed to the Category 4 Plan.   Exercise goals: All adults should avoid inactivity. Some physical activity is better than none, and adults who participate in any amount of physical activity gain some health benefits.  Behavioral modification strategies: increasing lean protein intake, meal planning and cooking strategies, keeping healthy foods in the home, and planning for success.  James Jennings has agreed to follow-up with our clinic in 2-3 weeks. He was informed of the importance of frequent follow-up visits to maximize his success with intensive lifestyle modifications for his multiple health conditions.   Objective:   Blood pressure 116/75, pulse 89, temperature 98.2 F (36.8 C), height 5\' 11"  (1.803 m), weight 269 lb (122 kg), SpO2 96 %. Body mass index is 37.52 kg/m.  General: Cooperative, alert, well developed, in no acute distress. HEENT: Conjunctivae and lids unremarkable. Cardiovascular: Regular rhythm.  Lungs: Normal work of breathing. Neurologic: No focal deficits.   Lab Results  Component Value Date   CREATININE 0.98 10/31/2020   BUN 19 10/31/2020   NA 139 10/31/2020   K 4.3 10/31/2020   CL 104 10/31/2020   CO2 27 10/31/2020   Lab Results  Component Value Date   ALT 20 10/31/2020   AST 19 10/31/2020   ALKPHOS 113 10/31/2020   BILITOT 0.5  10/31/2020   Lab Results  Component Value Date   HGBA1C 7.1 (H) 06/25/2020   HGBA1C 8.2 (H) 03/14/2020   HGBA1C 6.2 (H) 08/10/2018   Lab Results  Component Value Date   INSULIN 35.2 (H) 06/25/2020   INSULIN 28.0 (H) 03/14/2020   INSULIN 24.6  08/10/2018   Lab Results  Component Value Date   TSH 2.603 07/16/2020   Lab Results  Component Value Date   CHOL 148 06/25/2020   HDL 33 (L) 06/25/2020   LDLCALC 86 06/25/2020   TRIG 169 (H) 06/25/2020   CHOLHDL 4.5 06/25/2020   Lab Results  Component Value Date   VD25OH 42.9 06/25/2020   VD25OH 41.3 03/14/2020   VD25OH 51.2 08/10/2018   Lab Results  Component Value Date   WBC 5.0 10/31/2020   HGB 12.7 (L) 10/31/2020   HCT 37.6 (L) 10/31/2020   MCV 89.1 10/31/2020   PLT 197 10/31/2020    Attestation Statements:   Reviewed by clinician on day of visit: allergies, medications, problem list, medical history, surgical history, family history, social history, and previous encounter notes.  James Jennings, CMA, am acting as transcriptionist for Coralie Common, MD.   I have reviewed the above documentation for accuracy and completeness, and I agree with the above. - Coralie Common, MD

## 2020-12-09 ENCOUNTER — Ambulatory Visit (HOSPITAL_COMMUNITY)
Admission: RE | Admit: 2020-12-09 | Discharge: 2020-12-09 | Disposition: A | Discharge status: Home / Self Care | Payer: BC Managed Care – PPO | Source: Ambulatory Visit | Attending: Hematology & Oncology | Admitting: Hematology & Oncology

## 2020-12-09 ENCOUNTER — Encounter: Payer: Self-pay | Admitting: *Deleted

## 2020-12-09 DIAGNOSIS — C8223 Follicular lymphoma grade III, unspecified, intra-abdominal lymph nodes: Secondary | ICD-10-CM | POA: Diagnosis not present

## 2020-12-09 DIAGNOSIS — I6522 Occlusion and stenosis of left carotid artery: Secondary | ICD-10-CM | POA: Diagnosis not present

## 2020-12-09 DIAGNOSIS — J432 Centrilobular emphysema: Secondary | ICD-10-CM | POA: Diagnosis not present

## 2020-12-09 DIAGNOSIS — C8290 Follicular lymphoma, unspecified, unspecified site: Secondary | ICD-10-CM | POA: Diagnosis not present

## 2020-12-09 DIAGNOSIS — I2510 Atherosclerotic heart disease of native coronary artery without angina pectoris: Secondary | ICD-10-CM | POA: Diagnosis not present

## 2020-12-09 LAB — GLUCOSE, CAPILLARY: Glucose-Capillary: 135 mg/dL — ABNORMAL HIGH (ref 70–99)

## 2020-12-09 MED ORDER — FLUDEOXYGLUCOSE F - 18 (FDG) INJECTION
13.9000 | Freq: Once | INTRAVENOUS | Status: AC
  Administered 2020-12-09: 13.35 via INTRAVENOUS

## 2020-12-18 ENCOUNTER — Ambulatory Visit (INDEPENDENT_AMBULATORY_CARE_PROVIDER_SITE_OTHER): Payer: BC Managed Care – PPO | Admitting: Family Medicine

## 2020-12-18 ENCOUNTER — Other Ambulatory Visit: Payer: Self-pay

## 2020-12-18 ENCOUNTER — Encounter (INDEPENDENT_AMBULATORY_CARE_PROVIDER_SITE_OTHER): Payer: Self-pay | Admitting: Family Medicine

## 2020-12-18 VITALS — BP 102/68 | HR 89 | Temp 98.1°F | Ht 71.0 in | Wt 265.0 lb

## 2020-12-18 DIAGNOSIS — E1169 Type 2 diabetes mellitus with other specified complication: Secondary | ICD-10-CM

## 2020-12-18 DIAGNOSIS — E559 Vitamin D deficiency, unspecified: Secondary | ICD-10-CM | POA: Diagnosis not present

## 2020-12-18 DIAGNOSIS — E1165 Type 2 diabetes mellitus with hyperglycemia: Secondary | ICD-10-CM | POA: Diagnosis not present

## 2020-12-18 DIAGNOSIS — E785 Hyperlipidemia, unspecified: Secondary | ICD-10-CM | POA: Diagnosis not present

## 2020-12-18 DIAGNOSIS — Z6838 Body mass index (BMI) 38.0-38.9, adult: Secondary | ICD-10-CM

## 2020-12-18 MED ORDER — OZEMPIC (1 MG/DOSE) 4 MG/3ML ~~LOC~~ SOPN: 1.0000 mg | PEN_INJECTOR | SUBCUTANEOUS | 0 refills | Status: DC

## 2020-12-18 MED ORDER — METFORMIN HCL 500 MG PO TABS: 500.0000 mg | ORAL_TABLET | Freq: Every day | ORAL | 0 refills | Status: DC

## 2020-12-18 NOTE — Progress Notes (Signed)
Chief Complaint:   OBESITY James Jennings is here to discuss his progress with his obesity treatment plan along with follow-up of his obesity related diagnoses. James Jennings is on the Category 4 Plan and states he is following his eating plan approximately 75-80% of the time. James Jennings states he is not currently exercising.  Today's visit was #: 62 Starting weight: 277 lbs Starting date: 03/14/2020 Today's weight: 265 lbs Today's date: 12/18/2020 Total lbs lost to date: 12 Total lbs lost since last in-office visit: 4  Interim History: James Jennings has been working significantly over the last few weeks. He is taking all next week off and is planning on organizing and cleaning his house. [place] his [place]. Every now and then, pt may experience upset stomach. He finds himself working through lunch due to increase in number of meetings.  Subjective:   1. Type 2 diabetes mellitus with hyperglycemia, without long-term current use of insulin ([name]) [name] is on Ozempic 0.5 mg and denies GI side effects of Metformin. His last A1c was 7.1 and insulin level 35.2.  2. Hyperlipidemia associated with type 2 diabetes mellitus ([place]) James Jennings is on Lipitor and denies myalgias. His last LDL was 86.  3. Vitamin D deficiency Pt is not on prescription Vit D. He does report fatigue.  Assessment/Plan:   1. Type 2 diabetes mellitus with hyperglycemia, without long-term current use of insulin ([name]) Good blood sugar control is important to decrease the likelihood of diabetic complications such as nephropathy, neuropathy, limb loss, blindness, coronary artery disease, and death. Intensive lifestyle modification including diet, exercise and weight loss are the first line of treatment for diabetes. Check labs today.  Refill- Semaglutide, 1 MG/DOSE, (OZEMPIC, 1 MG/DOSE,) 4 MG/3ML SOPN; Inject 1 mg into the skin once a week.  Dispense: 3 mL; Refill: 0 Refill- metFORMIN (GLUCOPHAGE) 500 MG tablet;  Take 1 tablet (500 mg total) by mouth daily.  Dispense: 90 tablet; Refill: 0  - Urine Microalbumin w/creat. Ratio - Comprehensive metabolic panel - Hemoglobin A1c - Insulin, random  2. Hyperlipidemia associated with type 2 diabetes mellitus ([place]) Cardiovascular risk and specific lipid/LDL goals reviewed.  We discussed several lifestyle modifications today and James Jennings will continue to work on diet, exercise and weight loss efforts. Orders and follow up as documented in patient record.   Counseling Intensive lifestyle modifications are the first line treatment for this issue. Dietary changes: Increase soluble fiber. Decrease simple carbohydrates. Exercise changes: Moderate to vigorous-intensity aerobic activity 150 minutes per week if tolerated. Lipid-lowering medications: see documented in medical record. Check labs today.  - Lipid Panel With LDL/HDL Ratio  3. Vitamin D deficiency Low Vitamin D level contributes to fatigue and are associated with obesity, breast, and colon cancer. He will follow-up for routine testing of Vitamin D, at least 2-3 times per year to avoid over-replacement. Check labs today.  - VITAMIN D 25 Hydroxy (Vit-D Deficiency, Fractures)  4. Obesity BMI today is 27  James Jennings is currently in the action stage of change. As such, his goal is to continue with weight loss efforts. He has agreed to the Category 4 Plan.   Exercise goals: All adults should avoid inactivity. Some physical activity is better than none, and adults who participate in any amount of physical activity gain some health benefits.  Behavioral modification strategies: increasing lean protein intake, meal planning and cooking strategies, keeping healthy foods in the home, travel eating strategies, and holiday eating strategies .  James Jennings has agreed to follow-up with our clinic in 3 weeks. He was informed of the importance of frequent follow-up visits to maximize his success with intensive lifestyle modifications  for his multiple health conditions.   James Jennings was informed we would discuss his lab results at his next visit unless there is a critical issue that needs to be addressed sooner. James Jennings agreed to keep his next visit at the agreed upon time to discuss these results.  Objective:   Blood pressure 102/68, pulse 89, temperature 98.1 F (36.7 C), height 5\' 11"  (1.803 m), weight 265 lb (120.2 kg), SpO2 97 %. Body mass index is 36.96 kg/m.  General: Cooperative, alert, well developed, in no acute distress. HEENT: Conjunctivae and lids unremarkable. Cardiovascular: Regular rhythm.  Lungs: Normal work of breathing. Neurologic: No focal deficits.   Lab Results  Component Value Date   CREATININE 0.98 10/31/2020   BUN 19 10/31/2020   NA 139 10/31/2020   K 4.3 10/31/2020   CL 104 10/31/2020   CO2 27 10/31/2020   Lab Results  Component Value Date   ALT 20 10/31/2020   AST 19 10/31/2020   ALKPHOS 113 10/31/2020   BILITOT 0.5 10/31/2020   Lab Results  Component Value Date   HGBA1C 7.1 (H) 06/25/2020   HGBA1C 8.2 (H) 03/14/2020   HGBA1C 6.2 (H) 08/10/2018   Lab Results  Component Value Date   INSULIN 35.2 (H) 06/25/2020   INSULIN 28.0 (H) 03/14/2020   INSULIN 24.6 08/10/2018   Lab Results  Component Value Date   TSH 2.603 07/16/2020   Lab Results  Component Value Date   CHOL 148 06/25/2020   HDL 33 (L) 06/25/2020   LDLCALC 86 06/25/2020   TRIG 169 (H) 06/25/2020   CHOLHDL 4.5 06/25/2020   Lab Results  Component Value Date   VD25OH 42.9 06/25/2020   VD25OH 41.3 03/14/2020   VD25OH 51.2 08/10/2018   Lab Results  Component Value Date   WBC 5.0 10/31/2020   HGB 12.7 (L) 10/31/2020   HCT 37.6 (L) 10/31/2020   MCV 89.1 10/31/2020   PLT 197 10/31/2020    Attestation Statements:   Reviewed by clinician on day of visit: allergies, medications, problem list, medical history, surgical history, family history, social history, and previous encounter notes.  Coral Ceo,  CMA, am acting as transcriptionist for Coralie Common, MD.   I have reviewed the above documentation for accuracy and completeness, and I agree with the above. - Coralie Common, MD

## 2020-12-19 ENCOUNTER — Encounter: Payer: Self-pay | Admitting: Hematology & Oncology

## 2020-12-19 LAB — COMPREHENSIVE METABOLIC PANEL
ALT: 22 IU/L (ref 0–44)
AST: 19 IU/L (ref 0–40)
Albumin/Globulin Ratio: 2.1 (ref 1.2–2.2)
Albumin: 4.4 g/dL (ref 3.8–4.9)
Alkaline Phosphatase: 143 IU/L — ABNORMAL HIGH (ref 44–121)
BUN/Creatinine Ratio: 16 (ref 9–20)
BUN: 16 mg/dL (ref 6–24)
Bilirubin Total: 0.3 mg/dL (ref 0.0–1.2)
CO2: 22 mmol/L (ref 20–29)
Calcium: 9.1 mg/dL (ref 8.7–10.2)
Chloride: 101 mmol/L (ref 96–106)
Creatinine, Ser: 1.03 mg/dL (ref 0.76–1.27)
Globulin, Total: 2.1 g/dL (ref 1.5–4.5)
Glucose: 113 mg/dL — ABNORMAL HIGH (ref 70–99)
Potassium: 4.5 mmol/L (ref 3.5–5.2)
Sodium: 139 mmol/L (ref 134–144)
Total Protein: 6.5 g/dL (ref 6.0–8.5)
eGFR: 87 mL/min/{1.73_m2} (ref >59)

## 2020-12-19 LAB — LIPID PANEL WITH LDL/HDL RATIO
Cholesterol, Total: 171 mg/dL (ref 100–199)
HDL: 42 mg/dL (ref >39)
LDL Chol Calc (NIH): 110 mg/dL — ABNORMAL HIGH (ref 0–99)
LDL/HDL Ratio: 2.6 ratio (ref 0.0–3.6)
Triglycerides: 101 mg/dL (ref 0–149)
VLDL Cholesterol Cal: 19 mg/dL (ref 5–40)

## 2020-12-19 LAB — HEMOGLOBIN A1C
Est. average glucose Bld gHb Est-mCnc: 137 mg/dL
Hgb A1c MFr Bld: 6.4 % — ABNORMAL HIGH (ref 4.8–5.6)

## 2020-12-19 LAB — MICROALBUMIN / CREATININE URINE RATIO
Creatinine, Urine: 208 mg/dL
Microalb/Creat Ratio: 2 mg/g creat (ref 0–29)
Microalbumin, Urine: 4.5 ug/mL

## 2020-12-19 LAB — VITAMIN D 25 HYDROXY (VIT D DEFICIENCY, FRACTURES): Vit D, 25-Hydroxy: 50.5 ng/mL (ref 30.0–100.0)

## 2020-12-19 LAB — INSULIN, RANDOM: INSULIN: 22.6 u[IU]/mL (ref 2.6–24.9)

## 2020-12-20 ENCOUNTER — Other Ambulatory Visit (INDEPENDENT_AMBULATORY_CARE_PROVIDER_SITE_OTHER): Payer: Self-pay | Admitting: Family Medicine

## 2020-12-20 DIAGNOSIS — E1165 Type 2 diabetes mellitus with hyperglycemia: Secondary | ICD-10-CM

## 2020-12-23 NOTE — Telephone Encounter (Signed)
Dr.Ukleja 

## 2020-12-25 ENCOUNTER — Inpatient Hospital Stay: Payer: BC Managed Care – PPO | Attending: Hematology & Oncology

## 2020-12-25 ENCOUNTER — Encounter: Payer: Self-pay | Admitting: Hematology & Oncology

## 2020-12-25 ENCOUNTER — Other Ambulatory Visit: Payer: Self-pay

## 2020-12-25 ENCOUNTER — Inpatient Hospital Stay (HOSPITAL_BASED_OUTPATIENT_CLINIC_OR_DEPARTMENT_OTHER): Payer: BC Managed Care – PPO | Admitting: Hematology & Oncology

## 2020-12-25 ENCOUNTER — Inpatient Hospital Stay: Payer: BC Managed Care – PPO

## 2020-12-25 VITALS — BP 116/71 | HR 82 | Temp 99.0°F | Resp 18

## 2020-12-25 VITALS — Wt 265.0 lb

## 2020-12-25 DIAGNOSIS — Z5112 Encounter for antineoplastic immunotherapy: Secondary | ICD-10-CM | POA: Insufficient documentation

## 2020-12-25 DIAGNOSIS — C8223 Follicular lymphoma grade III, unspecified, intra-abdominal lymph nodes: Secondary | ICD-10-CM

## 2020-12-25 DIAGNOSIS — Z79899 Other long term (current) drug therapy: Secondary | ICD-10-CM | POA: Diagnosis not present

## 2020-12-25 LAB — CBC WITH DIFFERENTIAL (CANCER CENTER ONLY)
Abs Immature Granulocytes: 0.01 10*3/uL (ref 0.00–0.07)
Basophils Absolute: 0 10*3/uL (ref 0.0–0.1)
Basophils Relative: 0 %
Eosinophils Absolute: 0.1 10*3/uL (ref 0.0–0.5)
Eosinophils Relative: 2 %
HCT: 36.6 % — ABNORMAL LOW (ref 39.0–52.0)
Hemoglobin: 12.4 g/dL — ABNORMAL LOW (ref 13.0–17.0)
Immature Granulocytes: 0 %
Lymphocytes Relative: 14 %
Lymphs Abs: 0.7 10*3/uL (ref 0.7–4.0)
MCH: 30 pg (ref 26.0–34.0)
MCHC: 33.9 g/dL (ref 30.0–36.0)
MCV: 88.6 fL (ref 80.0–100.0)
Monocytes Absolute: 0.6 10*3/uL (ref 0.1–1.0)
Monocytes Relative: 12 %
Neutro Abs: 3.3 10*3/uL (ref 1.7–7.7)
Neutrophils Relative %: 72 %
Platelet Count: 200 10*3/uL (ref 150–400)
RBC: 4.13 MIL/uL — ABNORMAL LOW (ref 4.22–5.81)
RDW: 13.5 % (ref 11.5–15.5)
WBC Count: 4.7 10*3/uL (ref 4.0–10.5)
nRBC: 0 % (ref 0.0–0.2)

## 2020-12-25 LAB — CMP (CANCER CENTER ONLY)
ALT: 23 U/L (ref 0–44)
AST: 19 U/L (ref 15–41)
Albumin: 4.2 g/dL (ref 3.5–5.0)
Alkaline Phosphatase: 107 U/L (ref 38–126)
Anion gap: 8 (ref 5–15)
BUN: 14 mg/dL (ref 6–20)
CO2: 28 mmol/L (ref 22–32)
Calcium: 9.5 mg/dL (ref 8.9–10.3)
Chloride: 103 mmol/L (ref 98–111)
Creatinine: 0.96 mg/dL (ref 0.61–1.24)
GFR, Estimated: >60 mL/min (ref >60)
Glucose, Bld: 132 mg/dL — ABNORMAL HIGH (ref 70–99)
Potassium: 4 mmol/L (ref 3.5–5.1)
Sodium: 139 mmol/L (ref 135–145)
Total Bilirubin: 0.6 mg/dL (ref 0.3–1.2)
Total Protein: 6.4 g/dL — ABNORMAL LOW (ref 6.5–8.1)

## 2020-12-25 LAB — LACTATE DEHYDROGENASE: LDH: 138 U/L (ref 98–192)

## 2020-12-25 MED ORDER — ACETAMINOPHEN 325 MG PO TABS
650.0000 mg | ORAL_TABLET | Freq: Once | ORAL | Status: AC
  Administered 2020-12-25: 650 mg via ORAL
  Filled 2020-12-25: qty 2

## 2020-12-25 MED ORDER — DIPHENHYDRAMINE HCL 50 MG/ML IJ SOLN
12.5000 mg | Freq: Once | INTRAMUSCULAR | Status: AC
  Administered 2020-12-25: 12.5 mg via INTRAVENOUS
  Filled 2020-12-25: qty 1

## 2020-12-25 MED ORDER — SODIUM CHLORIDE 0.9 % IV SOLN
1000.0000 mg | Freq: Once | INTRAVENOUS | Status: AC
  Administered 2020-12-25: 1000 mg via INTRAVENOUS
  Filled 2020-12-25: qty 40

## 2020-12-25 MED ORDER — SODIUM CHLORIDE 0.9 % IV SOLN
40.0000 mg | Freq: Once | INTRAVENOUS | Status: AC
  Administered 2020-12-25: 40 mg via INTRAVENOUS
  Filled 2020-12-25: qty 4

## 2020-12-25 MED ORDER — HEPARIN SOD (PORK) LOCK FLUSH 100 UNIT/ML IV SOLN
500.0000 [IU] | Freq: Once | INTRAVENOUS | Status: AC | PRN
  Administered 2020-12-25: 500 [IU]

## 2020-12-25 MED ORDER — SODIUM CHLORIDE 0.9% FLUSH
10.0000 mL | INTRAVENOUS | Status: DC | PRN
  Administered 2020-12-25: 10 mL

## 2020-12-25 MED ORDER — SODIUM CHLORIDE 0.9 % IV SOLN: Freq: Once | INTRAVENOUS | Status: AC

## 2020-12-25 NOTE — Patient Instructions (Signed)
[facility]  Discharge Instructions: ?Thank you for choosing [facility] to provide your oncology and hematology care.  ? ?If you have a lab appointment with the Cancer Center, please go directly to the Cancer Center and check in at the registration area. ? ?Wear comfortable clothing and clothing appropriate for easy access to any Portacath or PICC line.  ? ?We strive to give you quality time with your provider. You may need to reschedule your appointment if you arrive late (15 or more minutes).  Arriving late affects you and other patients whose appointments are after yours.  Also, if you miss three or more appointments without notifying the office, you may be dismissed from the clinic at the provider?s discretion.    ?  ?For prescription refill requests, have your pharmacy contact our office and allow 72 hours for refills to be completed.   ? ?Today you received the following chemotherapy and/or immunotherapy agents Gazyva.    ?  ?To help prevent nausea and vomiting after your treatment, we encourage you to take your nausea medication as directed. ? ?BELOW ARE SYMPTOMS THAT SHOULD BE REPORTED IMMEDIATELY: ?*FEVER GREATER THAN 100.4 F (38 ?C) OR HIGHER ?*CHILLS OR SWEATING ?*NAUSEA AND VOMITING THAT IS NOT CONTROLLED WITH YOUR NAUSEA MEDICATION ?*UNUSUAL SHORTNESS OF BREATH ?*UNUSUAL BRUISING OR BLEEDING ?*URINARY PROBLEMS (pain or burning when urinating, or frequent urination) ?*BOWEL PROBLEMS (unusual diarrhea, constipation, pain near the anus) ?TENDERNESS IN MOUTH AND THROAT WITH OR WITHOUT PRESENCE OF ULCERS (sore throat, sores in mouth, or a toothache) ?UNUSUAL RASH, SWELLING OR PAIN  ?UNUSUAL VAGINAL DISCHARGE OR ITCHING  ? ?Items with * indicate a potential emergency and should be followed up as soon as possible or go to the Emergency Department if any problems should occur. ? ?Please show the CHEMOTHERAPY ALERT CARD or IMMUNOTHERAPY ALERT CARD at check-in to the  Emergency Department and triage nurse. ?Should you have questions after your visit or need to cancel or reschedule your appointment, please contact [facility]  [phone] and follow the prompts.  Office hours are 8:00 a.m. to 4:30 p.m. Monday - Friday. Please note that voicemails left after 4:00 p.m. may not be returned until the following business day.  We are closed weekends and major holidays. You have access to a nurse at all times for urgent questions. Please call the main number to the clinic [phone] and follow the prompts. ? ?For any non-urgent questions, you may also contact your provider using MyChart. We now offer e-Visits for anyone 18 and older to request care online for non-urgent symptoms. For details visit [email or web address]. ?  ?Also download the MyChart app! Go to the app store, search "MyChart", open the app, select [facility], and log in with your MyChart username and password. ? ?Due to Covid, a mask is required upon entering the hospital/clinic. If you do not have a mask, one will be given to you upon arrival. For doctor visits, patients may have 1 support person aged 18 or older with them. For treatment visits, patients cannot have anyone with them due to current Covid guidelines and our immunocompromised population.  ?

## 2020-12-25 NOTE — Progress Notes (Signed)
Hematology and Oncology Follow Up Visit  James Jennings [id number] 1970/03/29 50 y.o. 12/25/2020   Principle Diagnosis:  Follicular B- cell non-Hodgkin's lymphoma - Relapsed   Past Therapy:             Rituxan/bendamustine-s/p cycle 6 - completed on 07/03/2016   Current Therapy:  Gazyva  - Cytoxan/ Vincristine/Prednisone  - started 11/07/2019, s/p cycle #4 --  D/c on 03/26/2020 G-CHOP -- s/p cycle #6-- started on 04/03/2020 -completed on 07/13/2020 Maintenance Gazyva-s/p cycle 1/12 --  start on 09/05/2020    Interim History:  James Jennings is here today for follow-up.  He is doing very nicely.  He is working.  He does not exercise like he would like.  Hopefully, he will be able to exercise little bit over the Thanksgiving holiday.  He and his family will be at home.  Their son, reports he has Influenza.  Thankfully, he is doing okay.  He had a PET scan done 2 weeks ago.  Everything looked fine on the PET scan without any evidence of recurrence of the lymphoma.  There is been no problems with fever.  He has had no bleeding.  He has had no change in bowel or bladder habits.  He has had no rashes.  There is been no leg swelling.  He has had no cough or shortness of breath.  He has had no headache.  Overall, I would say his performance status is probably ECOG 0.     Medications:  Allergies as of 12/25/2020       Reactions   Mango Flavor Swelling, Other (See Comments)   LIPS SWELL   Shellfish Allergy Anaphylaxis   Lactose Intolerance (gi) Diarrhea   Rituximab Other (See Comments)        Medication List        Accurate as of December 25, 2020  9:21 AM. If you have any questions, ask your nurse or doctor.          acetaminophen 325 MG tablet Commonly known as: TYLENOL Take 650 mg by mouth every 6 (six) hours as needed for moderate pain or headache.   atorvastatin 80 MG tablet Commonly known as: LIPITOR Take 1 tablet (80 mg total) by mouth daily.   cetirizine 10 MG  tablet Commonly known as: ZYRTEC Take 10 mg by mouth daily as needed for allergies.   ibuprofen 200 MG tablet Commonly known as: ADVIL Take 600 mg by mouth every 8 (eight) hours as needed for mild pain or moderate pain.   lidocaine-prilocaine cream Commonly known as: EMLA Apply 1 application topically as needed. Apply to [facility] 1 hour prior to procedure.   metFORMIN 500 MG tablet Commonly known as: GLUCOPHAGE Take 1 tablet (500 mg total) by mouth daily.   montelukast 10 MG tablet Commonly known as: SINGULAIR Take 1 tablet (10 mg total) by mouth at bedtime.   multivitamin Tabs tablet Take 1 tablet by mouth daily.   Ozempic (1 MG/DOSE) 4 MG/3ML Sopn Generic drug: Semaglutide (1 MG/DOSE) Inject 1 mg into the skin once a week.        Allergies:  Allergies  Allergen Reactions   Mango Flavor Swelling and Other (See Comments)    LIPS SWELL   Shellfish Allergy Anaphylaxis   Lactose Intolerance (Gi) Diarrhea   Rituximab Other (See Comments)    Past Medical History, Surgical history, Social history, and Family History were reviewed and updated.  Review of Systems: Review of Systems  Constitutional: Negative.   HENT:  Negative.    Eyes: Negative.   Respiratory: Negative.    Cardiovascular: Negative.   Gastrointestinal: Negative.   Genitourinary: Negative.   Musculoskeletal: Negative.   Skin: Negative.   Neurological: Negative.   Endo/Heme/Allergies: Negative.   Psychiatric/Behavioral: Negative.      Physical Exam:  weight is 265 lb (120.2 kg).   Wt Readings from Last 3 Encounters:  12/25/20 265 lb (120.2 kg)  12/18/20 265 lb (120.2 kg)  12/04/20 269 lb (122 kg)    Physical Exam Vitals reviewed.  HENT:     Head: Normocephalic and atraumatic.  Eyes:     Pupils: Pupils are equal, round, and reactive to light.  Cardiovascular:     Rate and Rhythm: Normal rate and regular rhythm.     Heart sounds: Normal heart sounds.  Pulmonary:     Effort: Pulmonary effort  is normal.     Breath sounds: Normal breath sounds.  Abdominal:     General: Bowel sounds are normal.     Palpations: Abdomen is soft.  Musculoskeletal:        General: No tenderness or deformity. Normal range of motion.     Cervical back: Normal range of motion.  Lymphadenopathy:     Cervical: No cervical adenopathy.  Skin:    General: Skin is warm and dry.     Findings: No erythema or rash.  Neurological:     Mental Status: He is alert and oriented to person, place, and time.  Psychiatric:        Behavior: Behavior normal.        Thought Content: Thought content normal.        Judgment: Judgment normal.     Lab Results  Component Value Date   WBC 4.7 12/25/2020   HGB 12.4 (L) 12/25/2020   HCT 36.6 (L) 12/25/2020   MCV 88.6 12/25/2020   PLT 200 12/25/2020   No results found for: FERRITIN, IRON, TIBC, UIBC, IRONPCTSAT Lab Results  Component Value Date   RETICCTPCT 1.07 12/06/2015   RBC 4.13 (L) 12/25/2020   RETICCTABS 48.79 12/06/2015   No results found for: Nils Pyle, [facility] Lab Results  Component Value Date   IGGSERUM 576 (L) 09/05/2020   IGA 112 09/05/2020   IGMSERUM 14 (L) 09/05/2020   Lab Results  Component Value Date   TOTALPROTELP 6.5 10/31/2019   ALBUMINELP 3.7 10/31/2019   A1GS 0.2 10/31/2019   A2GS 0.7 10/31/2019   BETS 1.1 10/31/2019   GAMS 0.8 10/31/2019   MSPIKE Not Observed 10/31/2019     Chemistry      Component Value Date/Time   NA 139 12/18/2020 0817   NA 141 12/03/2016 1155   NA 141 02/07/2016 1149   K 4.5 12/18/2020 0817   K 3.8 12/03/2016 1155   K 4.3 02/07/2016 1149   CL 101 12/18/2020 0817   CL 100 12/03/2016 1155   CO2 22 12/18/2020 0817   CO2 27 12/03/2016 1155   CO2 27 02/07/2016 1149   BUN 16 12/18/2020 0817   BUN 10 12/03/2016 1155   BUN 10.9 02/07/2016 1149   CREATININE 1.03 12/18/2020 0817   CREATININE 0.89 09/05/2020 0940   CREATININE 1.0 12/03/2016 1155   CREATININE 0.9 02/07/2016 1149       Component Value Date/Time   CALCIUM 9.1 12/18/2020 0817   CALCIUM 9.1 12/03/2016 1155   CALCIUM 9.6 02/07/2016 1149   ALKPHOS 143 (H) 12/18/2020 0817   ALKPHOS 99 (H) 12/03/2016 1155   ALKPHOS 127  02/07/2016 1149   AST 19 12/18/2020 0817   AST 21 09/05/2020 0940   AST 17 02/07/2016 1149   ALT 22 12/18/2020 0817   ALT 22 09/05/2020 0940   ALT 24 12/03/2016 1155   ALT 16 02/07/2016 1149   BILITOT 0.3 12/18/2020 0817   BILITOT 0.4 09/05/2020 0940   BILITOT 0.42 02/07/2016 1149       Impression and Plan: James Jennings is a pleasant 50 yo caucasian gentleman with history of follicular large cell non-Hodgkin's lymphoma. He completed 6 cycles of chemotherapy with bendamustine on 07/03/2016 but was not tolerant of Rituxan (anaphylaxis). His lymphoma has  recurred.  We then treated him with CHOP.  This got him into remission.  He is on maintenance Gazyva now.  I think this would be a reasonable way to go.  He is done well.  He has had 2 cycles so far.  We will plan for another follow-up in 2 months.  [name], MD 11/23/20229:21 AM

## 2020-12-25 NOTE — Patient Instructions (Signed)

## 2020-12-26 LAB — IGG, IGA, IGM
IgA: 137 mg/dL (ref 90–386)
IgG (Immunoglobin G), Serum: 644 mg/dL (ref 603–1613)
IgM (Immunoglobulin M), Srm: 17 mg/dL — ABNORMAL LOW (ref 20–172)

## 2020-12-30 LAB — PROTEIN ELECTROPHORESIS, SERUM, WITH REFLEX
A/G Ratio: 1.5 (ref 0.7–1.7)
Albumin ELP: 3.7 g/dL (ref 2.9–4.4)
Alpha-1-Globulin: 0.2 g/dL (ref 0.0–0.4)
Alpha-2-Globulin: 0.7 g/dL (ref 0.4–1.0)
Beta Globulin: 1 g/dL (ref 0.7–1.3)
Gamma Globulin: 0.5 g/dL (ref 0.4–1.8)
Globulin, Total: 2.4 g/dL (ref 2.2–3.9)
Total Protein ELP: 6.1 g/dL (ref 6.0–8.5)

## 2020-12-30 NOTE — Telephone Encounter (Signed)
Dose change due to backorder? Please advise

## 2021-01-01 ENCOUNTER — Other Ambulatory Visit (INDEPENDENT_AMBULATORY_CARE_PROVIDER_SITE_OTHER): Payer: Self-pay | Admitting: Family Medicine

## 2021-01-01 MED ORDER — OZEMPIC (0.25 OR 0.5 MG/DOSE) 2 MG/1.5ML ~~LOC~~ SOPN: 0.5000 mg | PEN_INJECTOR | SUBCUTANEOUS | 0 refills | Status: DC

## 2021-01-02 DIAGNOSIS — Z23 Encounter for immunization: Secondary | ICD-10-CM | POA: Diagnosis not present

## 2021-01-02 DIAGNOSIS — C8290 Follicular lymphoma, unspecified, unspecified site: Secondary | ICD-10-CM | POA: Diagnosis not present

## 2021-01-06 ENCOUNTER — Encounter: Payer: Self-pay | Admitting: Hematology & Oncology

## 2021-01-14 ENCOUNTER — Other Ambulatory Visit: Payer: Self-pay

## 2021-01-14 ENCOUNTER — Ambulatory Visit (INDEPENDENT_AMBULATORY_CARE_PROVIDER_SITE_OTHER): Payer: BC Managed Care – PPO | Admitting: Family Medicine

## 2021-01-14 ENCOUNTER — Encounter (INDEPENDENT_AMBULATORY_CARE_PROVIDER_SITE_OTHER): Payer: Self-pay | Admitting: Family Medicine

## 2021-01-14 VITALS — BP 103/71 | HR 98 | Temp 98.2°F | Ht 71.0 in | Wt 269.0 lb

## 2021-01-14 DIAGNOSIS — E785 Hyperlipidemia, unspecified: Secondary | ICD-10-CM

## 2021-01-14 DIAGNOSIS — Z6837 Body mass index (BMI) 37.0-37.9, adult: Secondary | ICD-10-CM

## 2021-01-14 DIAGNOSIS — E1165 Type 2 diabetes mellitus with hyperglycemia: Secondary | ICD-10-CM | POA: Diagnosis not present

## 2021-01-14 DIAGNOSIS — E1169 Type 2 diabetes mellitus with other specified complication: Secondary | ICD-10-CM | POA: Diagnosis not present

## 2021-01-14 MED ORDER — TIRZEPATIDE 2.5 MG/0.5ML ~~LOC~~ SOAJ: 2.5000 mg | SUBCUTANEOUS | 0 refills | Status: DC

## 2021-01-14 NOTE — Progress Notes (Signed)
Chief Complaint:   OBESITY James Jennings is here to discuss his progress with his obesity treatment plan along with follow-up of his obesity related diagnoses. James Jennings is on the Category 4 Plan and states he is following his eating plan approximately 75% of the time. James Jennings states he is walking 10-20 minutes 3-4 times per week.  Today's visit was #: 14 Starting weight: 277 lbs Starting date: 03/14/2020 Today's weight: 269 lbs Today's date: 01/14/2021 Total lbs lost to date: 8 Total lbs lost since last in-office visit: 0  Interim History: Pt struggled to find Ozempic so he was off of it for 2 weeks. Next week, he is working but the following week, he is off. Pt wants to be aware of sweet/carb intake over the holiday. He is not sure what he will be doing for Christmas. Pt occasionally skips lunch.  Subjective:   1. Type 2 diabetes mellitus with hyperglycemia, without long-term current use of insulin ([name]) James Jennings is on Ozempic 0.5 mg but has had some difficulty getting meds. He has an A1c of 6.4.  2. Hyperlipidemia associated with type 2 diabetes mellitus ([place]) He has an LDL of 110 and is on Lipitor. Pt voices he has not always looked at saturated food content.  Assessment/Plan:   1. Type 2 diabetes mellitus with hyperglycemia, without long-term current use of insulin ([name]) Good blood sugar control is important to decrease the likelihood of diabetic complications such as nephropathy, neuropathy, limb loss, blindness, coronary artery disease, and death. Intensive lifestyle modification including diet, exercise and weight loss are the first line of treatment for diabetes. James Jennings will switch to [facility] 2.5 mg weekly. Discontinue Ozempic, due to supply chain issues.  Switch To- tirzepatide (MOUNJARO) 2.5 MG/0.5ML Pen; Inject 2.5 mg into the skin once a week.  Dispense: 2 mL; Refill: 0  2. Hyperlipidemia associated with type 2 diabetes mellitus ([place]) Cardiovascular risk and specific lipid/LDL goals  reviewed.  We discussed several lifestyle modifications today and James Jennings will continue to work on diet, exercise and weight loss efforts. Orders and follow up as documented in patient record. Follow up labs in 4 months.  Counseling Intensive lifestyle modifications are the first line treatment for this issue. Dietary changes: Increase soluble fiber. Decrease simple carbohydrates. Exercise changes: Moderate to vigorous-intensity aerobic activity 150 minutes per week if tolerated. Lipid-lowering medications: see documented in medical record.  3. Obesity with current BMI of 37.6  James Jennings is currently in the action stage of change. As such, his goal is to continue with weight loss efforts. He has agreed to the Category 4 Plan and keeping a food journal and adhering to recommended goals of 2000-2200 calories and 140+ grams protein.   Exercise goals:  As is  Behavioral modification strategies: increasing lean protein intake, no skipping meals, meal planning and cooking strategies, keeping healthy foods in the home, and holiday eating strategies .  James Jennings has agreed to follow-up with our clinic in 3-4 weeks. He was informed of the importance of frequent follow-up visits to maximize his success with intensive lifestyle modifications for his multiple health conditions.   Objective:   Blood pressure 103/71, pulse 98, temperature 98.2 F (36.8 C), height 5\' 11"  (1.803 m), weight 269 lb (122 kg), SpO2 96 %. Body mass index is 37.52 kg/m.  General: Cooperative, alert, well developed, in no acute distress. HEENT: Conjunctivae and lids unremarkable. Cardiovascular: Regular rhythm.  Lungs: Normal work of breathing. Neurologic: No focal deficits.   Lab Results  Component Value  Date   CREATININE 0.96 12/25/2020   BUN 14 12/25/2020   NA 139 12/25/2020   K 4.0 12/25/2020   CL 103 12/25/2020   CO2 28 12/25/2020   Lab Results  Component Value Date   ALT 23 12/25/2020   AST 19 12/25/2020   ALKPHOS 107  12/25/2020   BILITOT 0.6 12/25/2020   Lab Results  Component Value Date   HGBA1C 6.4 (H) 12/18/2020   HGBA1C 7.1 (H) 06/25/2020   HGBA1C 8.2 (H) 03/14/2020   HGBA1C 6.2 (H) 08/10/2018   Lab Results  Component Value Date   INSULIN 22.6 12/18/2020   INSULIN 35.2 (H) 06/25/2020   INSULIN 28.0 (H) 03/14/2020   INSULIN 24.6 08/10/2018   Lab Results  Component Value Date   TSH 2.603 07/16/2020   Lab Results  Component Value Date   CHOL 171 12/18/2020   HDL 42 12/18/2020   LDLCALC 110 (H) 12/18/2020   TRIG 101 12/18/2020   CHOLHDL 4.5 06/25/2020   Lab Results  Component Value Date   VD25OH 50.5 12/18/2020   VD25OH 42.9 06/25/2020   VD25OH 41.3 03/14/2020   Lab Results  Component Value Date   WBC 4.7 12/25/2020   HGB 12.4 (L) 12/25/2020   HCT 36.6 (L) 12/25/2020   MCV 88.6 12/25/2020   PLT 200 12/25/2020    Attestation Statements:   Reviewed by clinician on day of visit: allergies, medications, problem list, medical history, surgical history, family history, social history, and previous encounter notes.  Coral Ceo, CMA, am acting as transcriptionist for Coralie Common, MD.   I have reviewed the above documentation for accuracy and completeness, and I agree with the above. - Coralie Common, MD

## 2021-01-15 ENCOUNTER — Telehealth (INDEPENDENT_AMBULATORY_CARE_PROVIDER_SITE_OTHER): Payer: Self-pay | Admitting: Family Medicine

## 2021-01-15 ENCOUNTER — Encounter (INDEPENDENT_AMBULATORY_CARE_PROVIDER_SITE_OTHER): Payer: Self-pay

## 2021-01-15 NOTE — Telephone Encounter (Signed)
Prior authorization denied for [facility]. Patient has type 2 dm. Patient sent mychart message with coupon savings card.

## 2021-01-16 ENCOUNTER — Other Ambulatory Visit (INDEPENDENT_AMBULATORY_CARE_PROVIDER_SITE_OTHER): Payer: Self-pay | Admitting: Family Medicine

## 2021-01-16 NOTE — Telephone Encounter (Signed)
Pt last seen by Dr. Ukleja.  

## 2021-01-28 ENCOUNTER — Encounter (INDEPENDENT_AMBULATORY_CARE_PROVIDER_SITE_OTHER): Payer: Self-pay | Admitting: Adult Health

## 2021-01-28 ENCOUNTER — Other Ambulatory Visit (INDEPENDENT_AMBULATORY_CARE_PROVIDER_SITE_OTHER): Payer: Self-pay | Admitting: Adult Health

## 2021-01-28 MED ORDER — OZEMPIC (0.25 OR 0.5 MG/DOSE) 2 MG/1.5ML ~~LOC~~ SOPN: 0.5000 mg | PEN_INJECTOR | SUBCUTANEOUS | 0 refills | Status: DC

## 2021-02-14 NOTE — Progress Notes (Signed)
Pharmacy Residency Research Project: An Assessment of Fixed Rate Obinutuzumab Infusion in Follicular Lymphoma and Chronic Lymphocytic Leukemia  James Jennings is a 51 y.o. male being treated with obinutuzumab for recurrent follicular B cell lymphoma. This patient has consented to involvement in this project.   A pharmacist has verified the patient's eligibility for this project and has confirmed with their physician, Dr. Marin Olp.  This patient is going to be switched to fixed infusion obinutuzumab to be administered over 90 minutes without a titration.The treatment plan has been updated with this change.

## 2021-02-17 ENCOUNTER — Inpatient Hospital Stay: Payer: BC Managed Care – PPO

## 2021-02-17 ENCOUNTER — Inpatient Hospital Stay: Payer: BC Managed Care – PPO | Attending: Hematology & Oncology

## 2021-02-17 ENCOUNTER — Inpatient Hospital Stay (HOSPITAL_BASED_OUTPATIENT_CLINIC_OR_DEPARTMENT_OTHER): Payer: BC Managed Care – PPO | Admitting: Hematology & Oncology

## 2021-02-17 ENCOUNTER — Encounter: Payer: Self-pay | Admitting: Hematology & Oncology

## 2021-02-17 ENCOUNTER — Other Ambulatory Visit: Payer: Self-pay

## 2021-02-17 VITALS — BP 120/68 | HR 94 | Temp 97.8°F | Resp 18 | Ht 71.0 in | Wt 269.0 lb

## 2021-02-17 DIAGNOSIS — C8223 Follicular lymphoma grade III, unspecified, intra-abdominal lymph nodes: Secondary | ICD-10-CM

## 2021-02-17 DIAGNOSIS — Z5112 Encounter for antineoplastic immunotherapy: Secondary | ICD-10-CM | POA: Diagnosis not present

## 2021-02-17 DIAGNOSIS — Z79899 Other long term (current) drug therapy: Secondary | ICD-10-CM | POA: Diagnosis not present

## 2021-02-17 LAB — CMP (CANCER CENTER ONLY)
ALT: 20 U/L (ref 0–44)
AST: 17 U/L (ref 15–41)
Albumin: 4.4 g/dL (ref 3.5–5.0)
Alkaline Phosphatase: 120 U/L (ref 38–126)
Anion gap: 8 (ref 5–15)
BUN: 15 mg/dL (ref 6–20)
CO2: 28 mmol/L (ref 22–32)
Calcium: 9.5 mg/dL (ref 8.9–10.3)
Chloride: 105 mmol/L (ref 98–111)
Creatinine: 0.96 mg/dL (ref 0.61–1.24)
GFR, Estimated: >60 mL/min (ref >60)
Glucose, Bld: 100 mg/dL — ABNORMAL HIGH (ref 70–99)
Potassium: 4.2 mmol/L (ref 3.5–5.1)
Sodium: 141 mmol/L (ref 135–145)
Total Bilirubin: 0.7 mg/dL (ref 0.3–1.2)
Total Protein: 6.8 g/dL (ref 6.5–8.1)

## 2021-02-17 LAB — CBC WITH DIFFERENTIAL (CANCER CENTER ONLY)
Abs Immature Granulocytes: 0.01 10*3/uL (ref 0.00–0.07)
Basophils Absolute: 0 10*3/uL (ref 0.0–0.1)
Basophils Relative: 1 %
Eosinophils Absolute: 0.2 10*3/uL (ref 0.0–0.5)
Eosinophils Relative: 3 %
HCT: 38.7 % — ABNORMAL LOW (ref 39.0–52.0)
Hemoglobin: 13.1 g/dL (ref 13.0–17.0)
Immature Granulocytes: 0 %
Lymphocytes Relative: 15 %
Lymphs Abs: 0.8 10*3/uL (ref 0.7–4.0)
MCH: 30.6 pg (ref 26.0–34.0)
MCHC: 33.9 g/dL (ref 30.0–36.0)
MCV: 90.4 fL (ref 80.0–100.0)
Monocytes Absolute: 0.7 10*3/uL (ref 0.1–1.0)
Monocytes Relative: 13 %
Neutro Abs: 3.8 10*3/uL (ref 1.7–7.7)
Neutrophils Relative %: 68 %
Platelet Count: 224 10*3/uL (ref 150–400)
RBC: 4.28 MIL/uL (ref 4.22–5.81)
RDW: 13.9 % (ref 11.5–15.5)
WBC Count: 5.6 10*3/uL (ref 4.0–10.5)
nRBC: 0 % (ref 0.0–0.2)

## 2021-02-17 LAB — LACTATE DEHYDROGENASE: LDH: 144 U/L (ref 98–192)

## 2021-02-17 MED ORDER — SODIUM CHLORIDE 0.9 % IV SOLN: Freq: Once | INTRAVENOUS | Status: AC

## 2021-02-17 MED ORDER — SODIUM CHLORIDE 0.9 % IV SOLN
40.0000 mg | Freq: Once | INTRAVENOUS | Status: AC
  Administered 2021-02-17: 40 mg via INTRAVENOUS
  Filled 2021-02-17: qty 4

## 2021-02-17 MED ORDER — DIPHENHYDRAMINE HCL 50 MG/ML IJ SOLN
12.5000 mg | Freq: Once | INTRAMUSCULAR | Status: AC
  Administered 2021-02-17: 12.5 mg via INTRAVENOUS
  Filled 2021-02-17: qty 1

## 2021-02-17 MED ORDER — SODIUM CHLORIDE 0.9 % IV SOLN
1000.0000 mg | Freq: Once | INTRAVENOUS | Status: AC
  Administered 2021-02-17: 1000 mg via INTRAVENOUS
  Filled 2021-02-17: qty 40

## 2021-02-17 MED ORDER — ACETAMINOPHEN 325 MG PO TABS
650.0000 mg | ORAL_TABLET | Freq: Once | ORAL | Status: AC
  Administered 2021-02-17: 650 mg via ORAL
  Filled 2021-02-17: qty 2

## 2021-02-17 MED ORDER — HEPARIN SOD (PORK) LOCK FLUSH 100 UNIT/ML IV SOLN
500.0000 [IU] | Freq: Once | INTRAVENOUS | Status: AC | PRN
  Administered 2021-02-17: 500 [IU]

## 2021-02-17 MED ORDER — SODIUM CHLORIDE 0.9% FLUSH
10.0000 mL | INTRAVENOUS | Status: DC | PRN
  Administered 2021-02-17: 10 mL

## 2021-02-17 NOTE — Progress Notes (Signed)
Hematology and Oncology Follow Up Visit  [name] [id number] 02/15/1970 51 y.o. 02/17/2021   Principle Diagnosis:  Follicular B- cell non-Hodgkin's lymphoma - Relapsed   Past Therapy:             Rituxan/bendamustine-s/p cycle 6 - completed on 07/03/2016   Current Therapy:  Gazyva  - Cytoxan/ Vincristine/Prednisone  - started 11/07/2019, s/p cycle #4 --  D/c on 03/26/2020 G-CHOP -- s/p cycle #6-- started on 04/03/2020 -completed on 07/13/2020 Maintenance Gazyva-s/p cycle 3/12 --  start on 09/05/2020    Interim History:  [name] is here today for follow-up.  He had a pretty quiet holiday season.  His son got the flu for Christmas.  As such, he and his family stayed around the house.  He is still working.  His wife is working from home.  They are been both quite busy.  He has had no problems with nausea or vomiting.  There is been no problems with cough or shortness of breath.  He has had no change in bowel or bladder habits.  There has been no rashes.  His blood sugars have been doing quite well.  Today, his blood sugar is 100.  He has had no bleeding.  Overall, his performance status is ECOG 0.      Medications:  Allergies as of 02/17/2021       Reactions   Mango Flavor Swelling, Other (See Comments)   LIPS SWELL   Rituximab Other (See Comments)   Shellfish Allergy Anaphylaxis   Lactose Intolerance (gi) Diarrhea        Medication List        Accurate as of February 17, 2021  9:34 AM. If you have any questions, ask your nurse or doctor.          STOP taking these medications    montelukast 10 MG tablet Commonly known as: SINGULAIR Stopped by: [name], MD       TAKE these medications    acetaminophen 325 MG tablet Commonly known as: TYLENOL Take 650 mg by mouth every 6 (six) hours as needed for moderate pain or headache.   atorvastatin 80 MG tablet Commonly known as: LIPITOR Take 1 tablet (80 mg total) by mouth daily.   cetirizine 10 MG  tablet Commonly known as: ZYRTEC Take 10 mg by mouth daily as needed for allergies.   ibuprofen 200 MG tablet Commonly known as: ADVIL Take 600 mg by mouth every 8 (eight) hours as needed for mild pain or moderate pain.   lidocaine-prilocaine cream Commonly known as: EMLA Apply 1 application topically as needed. Apply to [facility] 1 hour prior to procedure.   metFORMIN 500 MG tablet Commonly known as: GLUCOPHAGE Take 1 tablet (500 mg total) by mouth daily.   multivitamin Tabs tablet Take 1 tablet by mouth daily.   Ozempic (0.25 or 0.5 MG/DOSE) 2 MG/1.5ML Sopn Generic drug: Semaglutide(0.25 or 0.5MG /DOS) Inject 0.5 mg into the skin once a week.        Allergies:  Allergies  Allergen Reactions   Mango Flavor Swelling and Other (See Comments)    LIPS SWELL   Rituximab Other (See Comments)   Shellfish Allergy Anaphylaxis   Lactose Intolerance (Gi) Diarrhea    Past Medical History, Surgical history, Social history, and Family History were reviewed and updated.  Review of Systems: Review of Systems  Constitutional: Negative.   HENT: Negative.    Eyes: Negative.   Respiratory: Negative.    Cardiovascular: Negative.  Gastrointestinal: Negative.   Genitourinary: Negative.   Musculoskeletal: Negative.   Skin: Negative.   Neurological: Negative.   Endo/Heme/Allergies: Negative.   Psychiatric/Behavioral: Negative.      Physical Exam:  height is 5\' 11"  (1.803 m) and weight is 269 lb (122 kg). His oral temperature is 97.8 F (36.6 C). His blood pressure is 120/68 and his pulse is 94. His respiration is 18 and oxygen saturation is 99%.   Wt Readings from Last 3 Encounters:  02/17/21 269 lb (122 kg)  01/14/21 269 lb (122 kg)  12/25/20 265 lb (120.2 kg)    Physical Exam Vitals reviewed.  HENT:     Head: Normocephalic and atraumatic.  Eyes:     Pupils: Pupils are equal, round, and reactive to light.  Cardiovascular:     Rate and Rhythm: Normal rate and regular rhythm.      Heart sounds: Normal heart sounds.  Pulmonary:     Effort: Pulmonary effort is normal.     Breath sounds: Normal breath sounds.  Abdominal:     General: Bowel sounds are normal.     Palpations: Abdomen is soft.  Musculoskeletal:        General: No tenderness or deformity. Normal range of motion.     Cervical back: Normal range of motion.  Lymphadenopathy:     Cervical: No cervical adenopathy.  Skin:    General: Skin is warm and dry.     Findings: No erythema or rash.  Neurological:     Mental Status: He is alert and oriented to person, place, and time.  Psychiatric:        Behavior: Behavior normal.        Thought Content: Thought content normal.        Judgment: Judgment normal.     Lab Results  Component Value Date   WBC 5.6 02/17/2021   HGB 13.1 02/17/2021   HCT 38.7 (L) 02/17/2021   MCV 90.4 02/17/2021   PLT 224 02/17/2021   No results found for: FERRITIN, IRON, TIBC, UIBC, IRONPCTSAT Lab Results  Component Value Date   RETICCTPCT 1.07 12/06/2015   RBC 4.28 02/17/2021   RETICCTABS 48.79 12/06/2015   No results found for: Nils Pyle [facility] Lab Results  Component Value Date   IGGSERUM 644 12/25/2020   IGA 137 12/25/2020   IGMSERUM 17 (L) 12/25/2020   Lab Results  Component Value Date   TOTALPROTELP 6.1 12/25/2020   ALBUMINELP 3.7 12/25/2020   A1GS 0.2 12/25/2020   A2GS 0.7 12/25/2020   BETS 1.0 12/25/2020   GAMS 0.5 12/25/2020   MSPIKE Not Observed 12/25/2020     Chemistry      Component Value Date/Time   NA 139 12/25/2020 0900   NA 139 12/18/2020 0817   NA 141 12/03/2016 1155   NA 141 02/07/2016 1149   K 4.0 12/25/2020 0900   K 3.8 12/03/2016 1155   K 4.3 02/07/2016 1149   CL 103 12/25/2020 0900   CL 100 12/03/2016 1155   CO2 28 12/25/2020 0900   CO2 27 12/03/2016 1155   CO2 27 02/07/2016 1149   BUN 14 12/25/2020 0900   BUN 16 12/18/2020 0817   BUN 10 12/03/2016 1155   BUN 10.9 02/07/2016 1149   CREATININE 0.96  12/25/2020 0900   CREATININE 1.0 12/03/2016 1155   CREATININE 0.9 02/07/2016 1149      Component Value Date/Time   CALCIUM 9.5 12/25/2020 0900   CALCIUM 9.1 12/03/2016 1155   CALCIUM 9.6  02/07/2016 1149   ALKPHOS 107 12/25/2020 0900   ALKPHOS 99 (H) 12/03/2016 1155   ALKPHOS 127 02/07/2016 1149   AST 19 12/25/2020 0900   AST 17 02/07/2016 1149   ALT 23 12/25/2020 0900   ALT 24 12/03/2016 1155   ALT 16 02/07/2016 1149   BILITOT 0.6 12/25/2020 0900   BILITOT 0.42 02/07/2016 1149       Impression and Plan: Mr. Chicoine is a pleasant 51 yo caucasian gentleman with history of follicular large cell non-Hodgkin's lymphoma. He completed 6 cycles of chemotherapy with bendamustine on 07/03/2016 but was not tolerant of Rituxan (anaphylaxis). His lymphoma has  recurred.  We then treated him with CHOP.  This got him into remission.  He is on maintenance Gazyva now.    We will proceed with his fourth cycle of maintenance Gazyva.  He has done well with this so far.  We will plan to get him back in 2 more months.  I am not sure we have to do any additional scans on him unless there might be new symptoms or abnormalities with his lab work.     [name], MD 1/16/20239:34 AM

## 2021-02-18 ENCOUNTER — Ambulatory Visit (INDEPENDENT_AMBULATORY_CARE_PROVIDER_SITE_OTHER): Payer: BC Managed Care – PPO | Admitting: Family Medicine

## 2021-02-18 ENCOUNTER — Encounter (INDEPENDENT_AMBULATORY_CARE_PROVIDER_SITE_OTHER): Payer: Self-pay | Admitting: Family Medicine

## 2021-02-18 ENCOUNTER — Encounter: Payer: Self-pay | Admitting: Gastroenterology

## 2021-02-18 VITALS — BP 116/74 | HR 79 | Temp 98.2°F | Ht 71.0 in | Wt 265.0 lb

## 2021-02-18 DIAGNOSIS — E669 Obesity, unspecified: Secondary | ICD-10-CM

## 2021-02-18 DIAGNOSIS — Z6837 Body mass index (BMI) 37.0-37.9, adult: Secondary | ICD-10-CM | POA: Diagnosis not present

## 2021-02-18 DIAGNOSIS — E7849 Other hyperlipidemia: Secondary | ICD-10-CM | POA: Diagnosis not present

## 2021-02-18 DIAGNOSIS — E1165 Type 2 diabetes mellitus with hyperglycemia: Secondary | ICD-10-CM

## 2021-02-18 DIAGNOSIS — E66812 Obesity, class 2: Secondary | ICD-10-CM

## 2021-02-18 MED ORDER — OZEMPIC (0.25 OR 0.5 MG/DOSE) 2 MG/1.5ML ~~LOC~~ SOPN: 0.5000 mg | PEN_INJECTOR | SUBCUTANEOUS | 0 refills | Status: DC

## 2021-02-18 NOTE — Progress Notes (Signed)
Chief Complaint:   OBESITY James Jennings is here to discuss his progress with his obesity treatment plan along with follow-up of his obesity related diagnoses. James Jennings is on keeping a food journal and adhering to recommended goals of 2000-2200 calories and 140+ grams protein and states he is following his eating plan approximately 80-85% of the time. James Jennings states he is walking 20 minutes 2-3 times per week.  Today's visit was #: 15 Starting weight: 277 lbs Starting date: 03/14/2020 Today's weight: 265 lbs Today's date: 02/18/2021 Total lbs lost to date: 12 Total lbs lost since last in-office visit: 4  Interim History: Pt had a quiet holiday- son got the flu and was sick prior to Christmas so family stayed local. He had infusion yesterday- BS was 100. Pt has been off and on journaling- made mindful choices but is unsure of makeup/quantity. He wants to get back on his bike.  Subjective:   1. Type 2 diabetes mellitus with hyperglycemia, without long-term current use of insulin ([name]) James Jennings is on Ozempic and Metformin. His last A1c was 6.4 in 12/18/2020.  2. Other hyperlipidemia Pt has an LDL of 110, HDL 42, and triglycerides 101. He is on Lipitor.  Assessment/Plan:   1. Type 2 diabetes mellitus with hyperglycemia, without long-term current use of insulin ([name]) Good blood sugar control is important to decrease the likelihood of diabetic complications such as nephropathy, neuropathy, limb loss, blindness, coronary artery disease, and death. Intensive lifestyle modification including diet, exercise and weight loss are the first line of treatment for diabetes.   Refill- Semaglutide,0.25 or 0.5MG /DOS, (OZEMPIC, 0.25 OR 0.5 MG/DOSE,) 2 MG/1.5ML SOPN; Inject 0.5 mg into the skin once a week.  Dispense: 2 mL; Refill: 0  2. Other hyperlipidemia Cardiovascular risk and specific lipid/LDL goals reviewed.  We discussed several lifestyle modifications today and James Jennings will continue to work on diet, exercise and  weight loss efforts. Orders and follow up as documented in patient record. Continue Lipitor with no change in dose.  Counseling Intensive lifestyle modifications are the first line treatment for this issue. Dietary changes: Increase soluble fiber. Decrease simple carbohydrates. Exercise changes: Moderate to vigorous-intensity aerobic activity 150 minutes per week if tolerated. Lipid-lowering medications: see documented in medical record.  3. Obesity with current BMI of 37.1  James Jennings is currently in the action stage of change. As such, his goal is to continue with weight loss efforts. He has agreed to keeping a food journal and adhering to recommended goals of 2000-2200 calories and 140+ grams protein.   Exercise goals: All adults should avoid inactivity. Some physical activity is better than none, and adults who participate in any amount of physical activity gain some health benefits. Pt is to start 10 minutes on bike 3-4 times a week.  Behavioral modification strategies: increasing lean protein intake, meal planning and cooking strategies, and keeping healthy foods in the home.  James Jennings has agreed to follow-up with our clinic in 4 weeks. He was informed of the importance of frequent follow-up visits to maximize his success with intensive lifestyle modifications for his multiple health conditions.   Objective:   Blood pressure 116/74, pulse 79, temperature 98.2 F (36.8 C), height 5\' 11"  (1.803 m), weight 265 lb (120.2 kg), SpO2 97 %. Body mass index is 36.96 kg/m.  General: Cooperative, alert, well developed, in no acute distress. HEENT: Conjunctivae and lids unremarkable. Cardiovascular: Regular rhythm.  Lungs: Normal work of breathing. Neurologic: No focal deficits.   Lab Results  Component Value  Date   CREATININE 0.96 02/17/2021   BUN 15 02/17/2021   NA 141 02/17/2021   K 4.2 02/17/2021   CL 105 02/17/2021   CO2 28 02/17/2021   Lab Results  Component Value Date   ALT 20  02/17/2021   AST 17 02/17/2021   ALKPHOS 120 02/17/2021   BILITOT 0.7 02/17/2021   Lab Results  Component Value Date   HGBA1C 6.4 (H) 12/18/2020   HGBA1C 7.1 (H) 06/25/2020   HGBA1C 8.2 (H) 03/14/2020   HGBA1C 6.2 (H) 08/10/2018   Lab Results  Component Value Date   INSULIN 22.6 12/18/2020   INSULIN 35.2 (H) 06/25/2020   INSULIN 28.0 (H) 03/14/2020   INSULIN 24.6 08/10/2018   Lab Results  Component Value Date   TSH 2.603 07/16/2020   Lab Results  Component Value Date   CHOL 171 12/18/2020   HDL 42 12/18/2020   LDLCALC 110 (H) 12/18/2020   TRIG 101 12/18/2020   CHOLHDL 4.5 06/25/2020   Lab Results  Component Value Date   VD25OH 50.5 12/18/2020   VD25OH 42.9 06/25/2020   VD25OH 41.3 03/14/2020   Lab Results  Component Value Date   WBC 5.6 02/17/2021   HGB 13.1 02/17/2021   HCT 38.7 (L) 02/17/2021   MCV 90.4 02/17/2021   PLT 224 02/17/2021    Attestation Statements:   Reviewed by clinician on day of visit: allergies, medications, problem list, medical history, surgical history, family history, social history, and previous encounter notes.  Coral Ceo, CMA, am acting as transcriptionist for Coralie Common, MD.   I have reviewed the above documentation for accuracy and completeness, and I agree with the above. - Coralie Common, MD

## 2021-03-03 ENCOUNTER — Telehealth: Payer: Self-pay | Admitting: *Deleted

## 2021-03-03 NOTE — Telephone Encounter (Signed)
PE, Thanks so much for the follow-up and knowing that his blood counts will not be affected by his monoclonal antibody.  LT, Okay to move forward with direct colonoscopy. Thanks. GM

## 2021-03-03 NOTE — Telephone Encounter (Signed)
Dr Rush Landmark- this pt. Is being treated with chemo for lymphoma. Procedure scheduled 2/28. He is a direct for colon. Is it ok to proceed direct to [facility] or does he need office visit? Thank you! Lattie Haw PV

## 2021-03-03 NOTE — Telephone Encounter (Signed)
LT, I am OK for the direct procedure as it looks like he has had multiple cycles of his chemotherapy.  I am placing his Oncologist on here just to confirm though.  PE, Do you anticipate any sort of leukopenia/lymphopenia around the time of this patient's chemotherapy, such that a colonoscopy on 2/28 would still be OK to pursue? Appreciate the help as always.  GM

## 2021-03-07 ENCOUNTER — Encounter (INDEPENDENT_AMBULATORY_CARE_PROVIDER_SITE_OTHER): Payer: Self-pay

## 2021-03-07 ENCOUNTER — Other Ambulatory Visit (INDEPENDENT_AMBULATORY_CARE_PROVIDER_SITE_OTHER): Payer: Self-pay | Admitting: Family Medicine

## 2021-03-07 DIAGNOSIS — E7849 Other hyperlipidemia: Secondary | ICD-10-CM

## 2021-03-07 NOTE — Telephone Encounter (Signed)
Rayaan, do you have enough atorvastatin until your next visit? Thanks, [place], CMA

## 2021-03-07 NOTE — Telephone Encounter (Signed)
Message sent

## 2021-03-17 ENCOUNTER — Other Ambulatory Visit: Payer: Self-pay

## 2021-03-17 ENCOUNTER — Ambulatory Visit (AMBULATORY_SURGERY_CENTER): Payer: BC Managed Care – PPO | Admitting: *Deleted

## 2021-03-17 ENCOUNTER — Encounter: Payer: BC Managed Care – PPO | Admitting: Internal Medicine

## 2021-03-17 VITALS — Ht 71.0 in | Wt 267.0 lb

## 2021-03-17 DIAGNOSIS — Z1211 Encounter for screening for malignant neoplasm of colon: Secondary | ICD-10-CM

## 2021-03-17 MED ORDER — NA SULFATE-K SULFATE-MG SULF 17.5-3.13-1.6 GM/177ML PO SOLN: 2.0000 | Freq: Once | ORAL | 0 refills | Status: AC

## 2021-03-17 NOTE — Progress Notes (Signed)

## 2021-03-24 DIAGNOSIS — R0681 Apnea, not elsewhere classified: Secondary | ICD-10-CM | POA: Diagnosis not present

## 2021-03-25 DIAGNOSIS — R0681 Apnea, not elsewhere classified: Secondary | ICD-10-CM | POA: Diagnosis not present

## 2021-03-26 ENCOUNTER — Ambulatory Visit (INDEPENDENT_AMBULATORY_CARE_PROVIDER_SITE_OTHER): Payer: BC Managed Care – PPO | Admitting: Family Medicine

## 2021-03-26 ENCOUNTER — Encounter (INDEPENDENT_AMBULATORY_CARE_PROVIDER_SITE_OTHER): Payer: Self-pay | Admitting: Family Medicine

## 2021-03-26 ENCOUNTER — Other Ambulatory Visit: Payer: Self-pay

## 2021-03-26 ENCOUNTER — Other Ambulatory Visit (INDEPENDENT_AMBULATORY_CARE_PROVIDER_SITE_OTHER): Payer: Self-pay | Admitting: Family Medicine

## 2021-03-26 VITALS — BP 114/74 | HR 72 | Temp 98.3°F | Ht 71.0 in | Wt 265.0 lb

## 2021-03-26 DIAGNOSIS — Z7985 Long-term (current) use of injectable non-insulin antidiabetic drugs: Secondary | ICD-10-CM

## 2021-03-26 DIAGNOSIS — E1165 Type 2 diabetes mellitus with hyperglycemia: Secondary | ICD-10-CM

## 2021-03-26 DIAGNOSIS — J302 Other seasonal allergic rhinitis: Secondary | ICD-10-CM

## 2021-03-26 DIAGNOSIS — E66812 Obesity, class 2: Secondary | ICD-10-CM

## 2021-03-26 DIAGNOSIS — Z6837 Body mass index (BMI) 37.0-37.9, adult: Secondary | ICD-10-CM | POA: Diagnosis not present

## 2021-03-26 DIAGNOSIS — E669 Obesity, unspecified: Secondary | ICD-10-CM

## 2021-03-26 MED ORDER — OZEMPIC (0.25 OR 0.5 MG/DOSE) 2 MG/1.5ML ~~LOC~~ SOPN: 0.5000 mg | PEN_INJECTOR | SUBCUTANEOUS | 0 refills | Status: DC

## 2021-03-26 NOTE — Progress Notes (Signed)
Chief Complaint:   OBESITY James Jennings is here to discuss his progress with his obesity treatment plan along with follow-up of his obesity related diagnoses. James Jennings is on keeping a food journal and adhering to recommended goals of 2000-2200 calories and 140 grams protein and states he is following his eating plan approximately 80% of the time. James Jennings states he is biking 40 minutes 2-3 times per week.  Today's visit was #: 71 Starting weight: 277 lbs Starting date: 03/14/2020 Today's weight: 265 lbs Today's date: 03/26/2021 Total lbs lost to date: 12 Total lbs lost since last in-office visit: 0  Interim History: James Jennings has just finished his busy time of the year at work. He recognizes that he has not journaled on [employer]. He did have an at home sleep study and was found to have borderline severe apnea. He has been consistently riding his bike. Pt has a colonoscopy next week, infusion March 16th, and [facility] March 20th.  Subjective:   1. Type 2 diabetes mellitus with hyperglycemia, without long-term current use of insulin ([name]) Pt is on 0.5 mg Ozempic and 500 mg Metformin. He notices no side effects.  2. Seasonal allergies Pt is on Zyrtec and gets drainage, but is otherwise controlled on current meds.  Assessment/Plan:   1. Type 2 diabetes mellitus with hyperglycemia, without long-term current use of insulin ([name]) Good blood sugar control is important to decrease the likelihood of diabetic complications such as nephropathy, neuropathy, limb loss, blindness, coronary artery disease, and death. Intensive lifestyle modification including diet, exercise and weight loss are the first line of treatment for diabetes.   Refill- Semaglutide,0.25 or 0.5MG /DOS, (OZEMPIC, 0.25 OR 0.5 MG/DOSE,) 2 MG/1.5ML SOPN; Inject 0.5 mg into the skin once a week.  Dispense: 2 mL; Refill: 0  2. Seasonal allergies Continue Zyrtec. No change in therapy. Pt advised he can do Flonase as additional therapy.  3.  Obesity with current BMI of 37.0 James Jennings is currently in the action stage of change. As such, his goal is to continue with weight loss efforts. He has agreed to keeping a food journal and adhering to recommended goals of 2000-2200 calories and 140+ grams protein.   Exercise goals:  As is  Behavioral modification strategies: increasing lean protein intake, meal planning and cooking strategies, keeping healthy foods in the home, and planning for success.  James Jennings has agreed to follow-up with our clinic in 3 weeks. He was informed of the importance of frequent follow-up visits to maximize his success with intensive lifestyle modifications for his multiple health conditions.   Objective:   Blood pressure 114/74, pulse 72, temperature 98.3 F (36.8 C), height 5\' 11"  (1.803 m), weight 265 lb (120.2 kg), SpO2 96 %. Body mass index is 36.96 kg/m.  General: Cooperative, alert, well developed, in no acute distress. HEENT: Conjunctivae and lids unremarkable. Cardiovascular: Regular rhythm.  Lungs: Normal work of breathing. Neurologic: No focal deficits.   Lab Results  Component Value Date   CREATININE 0.96 02/17/2021   BUN 15 02/17/2021   NA 141 02/17/2021   K 4.2 02/17/2021   CL 105 02/17/2021   CO2 28 02/17/2021   Lab Results  Component Value Date   ALT 20 02/17/2021   AST 17 02/17/2021   ALKPHOS 120 02/17/2021   BILITOT 0.7 02/17/2021   Lab Results  Component Value Date   HGBA1C 6.4 (H) 12/18/2020   HGBA1C 7.1 (H) 06/25/2020   HGBA1C 8.2 (H) 03/14/2020   HGBA1C 6.2 (H) 08/10/2018  Lab Results  Component Value Date   INSULIN 22.6 12/18/2020   INSULIN 35.2 (H) 06/25/2020   INSULIN 28.0 (H) 03/14/2020   INSULIN 24.6 08/10/2018   Lab Results  Component Value Date   TSH 2.603 07/16/2020   Lab Results  Component Value Date   CHOL 171 12/18/2020   HDL 42 12/18/2020   LDLCALC 110 (H) 12/18/2020   TRIG 101 12/18/2020   CHOLHDL 4.5 06/25/2020   Lab Results  Component Value  Date   VD25OH 50.5 12/18/2020   VD25OH 42.9 06/25/2020   VD25OH 41.3 03/14/2020   Lab Results  Component Value Date   WBC 5.6 02/17/2021   HGB 13.1 02/17/2021   HCT 38.7 (L) 02/17/2021   MCV 90.4 02/17/2021   PLT 224 02/17/2021   Attestation Statements:   Reviewed by clinician on day of visit: allergies, medications, problem list, medical history, surgical history, family history, social history, and previous encounter notes.  Coral Ceo, CMA, am acting as transcriptionist for Coralie Common, MD.   I have reviewed the above documentation for accuracy and completeness, and I agree with the above. - Coralie Common, MD

## 2021-03-27 ENCOUNTER — Other Ambulatory Visit (INDEPENDENT_AMBULATORY_CARE_PROVIDER_SITE_OTHER): Payer: Self-pay | Admitting: Family Medicine

## 2021-03-27 ENCOUNTER — Encounter (INDEPENDENT_AMBULATORY_CARE_PROVIDER_SITE_OTHER): Payer: Self-pay

## 2021-03-27 DIAGNOSIS — E1165 Type 2 diabetes mellitus with hyperglycemia: Secondary | ICD-10-CM

## 2021-03-27 NOTE — Telephone Encounter (Signed)
LAST APPOINTMENT DATE: 03/26/21 NEXT APPOINTMENT DATE: 04/28/21   [id number] [place], [place] - [address] [place] [address] Phone: [phone] Fax: [phone]  [place] [address] Phone: [phone] Fax: [phone]  [id number] - [place], [place] AT [address] [place] [address] Phone: [phone] Fax: [phone]  Patient is requesting a refill of the following medications: Pending Prescriptions:                       Disp   Refills   metFORMIN (GLUCOPHAGE) 500 MG tablet [Phar*90 tab*0       Sig: Take 1 tablet (500 mg total) by mouth daily.   Date last filled: 12/18/20 # 90 Previously prescribed by Dr Jearld Shines  Lab Results      Component                Value               Date                      HGBA1C                   6.4 (H)             12/18/2020                HGBA1C                   7.1 (H)             06/25/2020                HGBA1C                   8.2 (H)             03/14/2020           Lab Results      Component                Value               Date                      LDLCALC                  110 (H)             12/18/2020                CREATININE               0.96                02/17/2021           Lab Results      Component                Value               Date                      VD25OH                   50.5  12/18/2020                VD25OH                   42.9                06/25/2020                VD25OH                   41.3                03/14/2020            BP Readings from Last 3 Encounters: 03/26/21 : 114/74 02/18/21 : 116/74 02/17/21 : 120/68

## 2021-03-27 NOTE — Telephone Encounter (Signed)
Message sent to pt.

## 2021-03-28 ENCOUNTER — Encounter: Payer: Self-pay | Admitting: Gastroenterology

## 2021-03-28 DIAGNOSIS — G4733 Obstructive sleep apnea (adult) (pediatric): Secondary | ICD-10-CM | POA: Diagnosis not present

## 2021-03-31 ENCOUNTER — Encounter: Payer: Self-pay | Admitting: Certified Registered Nurse Anesthetist

## 2021-04-01 ENCOUNTER — Encounter: Payer: Self-pay | Admitting: Gastroenterology

## 2021-04-01 ENCOUNTER — Other Ambulatory Visit: Payer: Self-pay

## 2021-04-01 ENCOUNTER — Ambulatory Visit (AMBULATORY_SURGERY_CENTER): Payer: BC Managed Care – PPO | Admitting: Gastroenterology

## 2021-04-01 VITALS — BP 108/69 | HR 56 | Temp 98.0°F | Resp 13 | Ht 71.0 in | Wt 267.0 lb

## 2021-04-01 DIAGNOSIS — D123 Benign neoplasm of transverse colon: Secondary | ICD-10-CM | POA: Diagnosis not present

## 2021-04-01 DIAGNOSIS — D129 Benign neoplasm of anus and anal canal: Secondary | ICD-10-CM

## 2021-04-01 DIAGNOSIS — Z1211 Encounter for screening for malignant neoplasm of colon: Secondary | ICD-10-CM | POA: Diagnosis not present

## 2021-04-01 DIAGNOSIS — D128 Benign neoplasm of rectum: Secondary | ICD-10-CM | POA: Diagnosis not present

## 2021-04-01 DIAGNOSIS — D124 Benign neoplasm of descending colon: Secondary | ICD-10-CM | POA: Diagnosis not present

## 2021-04-01 DIAGNOSIS — D127 Benign neoplasm of rectosigmoid junction: Secondary | ICD-10-CM | POA: Diagnosis not present

## 2021-04-01 DIAGNOSIS — D125 Benign neoplasm of sigmoid colon: Secondary | ICD-10-CM | POA: Diagnosis not present

## 2021-04-01 MED ORDER — SODIUM CHLORIDE 0.9 % IV SOLN: 500.0000 mL | Freq: Once | INTRAVENOUS | Status: DC

## 2021-04-01 NOTE — Op Note (Signed)
[facility] Patient Name: James Jennings Procedure Date: 04/01/2021 7:25 AM MRN: [id number] Endoscopist: Justice Britain , MD Age: 51 Referring MD:  Date of Birth: 17-Oct-1970 Gender: Male Account #: [id number] Procedure:                Colonoscopy Indications:              Screening for colorectal malignant neoplasm, This                            is the patient's first colonoscopy Medicines:                Monitored Anesthesia Care Procedure:                Pre-Anesthesia Assessment:                           - Prior to the procedure, a History and Physical                            was performed, and patient medications and                            allergies were reviewed. The patient's tolerance of                            previous anesthesia was also reviewed. The risks                            and benefits of the procedure and the sedation                            options and risks were discussed with the patient.                            All questions were answered, and informed consent                            was obtained. Prior Anticoagulants: The patient has                            taken no previous anticoagulant or antiplatelet                            agents. ASA Grade Assessment: II - A patient with                            mild systemic disease. After reviewing the risks                            and benefits, the patient was deemed in                            satisfactory condition to undergo the procedure.  After obtaining informed consent, the colonoscope                            was passed under direct vision. Throughout the                            procedure, the patient's blood pressure, pulse, and                            oxygen saturations were monitored continuously. The                            CF [id number] #[id number] was introduced through the anus                            and advanced to the 5 cm  into the ileum. The                            colonoscopy was performed without difficulty. The                            patient tolerated the procedure. The quality of the                            bowel preparation was good. The terminal ileum,                            ileocecal valve, appendiceal orifice, and rectum                            were photographed. Scope In: 8:06:43 AM Scope Out: 8:22:43 AM Scope Withdrawal Time: 0 hours 12 minutes 47 seconds  Total Procedure Duration: 0 hours 16 minutes 0 seconds  Findings:                 The digital rectal exam findings include                            hemorrhoids. Pertinent negatives include no                            palpable rectal lesions.                           The terminal ileum and ileocecal valve appeared                            normal.                           Five sessile polyps were found in the rectum (1),                            sigmoid colon (1), descending colon (2) and  transverse colon (1). The polyps were 2 to 8 mm in                            size. These polyps were removed with a cold snare.                            Resection and retrieval were complete.                           A few small-mouthed diverticula were found in the                            recto-sigmoid colon and sigmoid colon.                           Normal mucosa was found in the entire colon                            otherwise.                           Non-bleeding non-thrombosed internal hemorrhoids                            were found during retroflexion, during perianal                            exam and during digital exam. The hemorrhoids were                            Grade II (internal hemorrhoids that prolapse but                            reduce spontaneously). Complications:            No immediate complications. Estimated Blood Loss:     Estimated blood loss was  minimal. Impression:               - Hemorrhoids found on digital rectal exam.                           - The examined portion of the ileum was normal.                           - Five 2 to 8 mm polyps in the rectum, in the                            descending colon and in the transverse colon,                            removed with a cold snare. Resected and retrieved.                           - Diverticulosis in the recto-sigmoid colon and in  the sigmoid colon.                           - Normal mucosa in the entire examined colon                            otherwise.                           - Non-bleeding non-thrombosed internal hemorrhoids. Recommendation:           - The patient will be observed post-procedure,                            until all discharge criteria are met.                           - Discharge patient to home.                           - Patient has a contact number available for                            emergencies. The signs and symptoms of potential                            delayed complications were discussed with the                            patient. Return to normal activities tomorrow.                            Written discharge instructions were provided to the                            patient.                           - High fiber diet.                           - Use FiberCon 1-2 tablets PO daily.                           - Continue present medications.                           - Await pathology results.                           - Repeat colonoscopy in 3/5/7 years for                            surveillance based on pathology results.                           - The findings and recommendations were discussed  with the patient.                           - The findings and recommendations were discussed                            with the patient's family. Justice Britain,  MD 04/01/2021 8:28:53 AM

## 2021-04-01 NOTE — Progress Notes (Signed)
[name]  Pt's states no medical or surgical changes since previsit or office visit.  

## 2021-04-01 NOTE — Progress Notes (Signed)
GASTROENTEROLOGY PROCEDURE H&P NOTE   Primary Care Physician: [name], MD  HPI: James Jennings is a 51 y.o. male who presents for Colonoscopy for screening.  Past Medical History:  Diagnosis Date   Anxiety    Bilateral swelling of feet    Diabetes mellitus without complication ([name])    High cholesterol    Joint pain    Lactose intolerance    Large cell, follicular non-Hodgkin's lymphoma ([name])    Lymphoma, follicular ([place]) dx'd 77/4128   Multiple food allergies    Other fatigue    PONV (postoperative nausea and vomiting)    Pre-diabetes    Prediabetes    Shortness of breath on exertion    Sleep apnea    "dx'd ~ 2008; never RX'd mask" (02/28/2016)   Past Surgical History:  Procedure Laterality Date   ACHILLES TENDON SURGERY Left ~ 2012   APPENDECTOMY  11/12/2015   lap appy   CLUB FOOT RELEASE Bilateral 1971   HERNIA REPAIR     INGUINAL LYMPH NODE BIOPSY Right 01/20/2016   Procedure: EXCISIONAL BIOPSY OF RIGHT INGUINAL LYMPH NODE;  Surgeon: Greer Pickerel, MD;  Location: [facility];  Service: General;  Laterality: Right;   IR IMAGING GUIDED PORT INSERTION  11/02/2019   LAPAROSCOPIC APPENDECTOMY N/A 11/12/2015   Procedure: APPENDECTOMY LAPAROSCOPIC;  Surgeon: Greer Pickerel, MD;  Location: [facility];  Service: General;  Laterality: N/A;   LYMPH NODE BIOPSY Left 03/20/2020   Procedure: EXCISIONAL BIOPSY LEFT INGUINAL LYMPH NODE;  Surgeon: Coralie Keens, MD;  Location: [facility];  Service: General;  Laterality: Left;  LMA   SUTURE REMOVAL Left ~ 2016-2017 X 3   "had to use permanent sutures w/my achilles OR; my body rejects them at times & I have to have them surgically removed; under anesthesia"   VASECTOMY Bilateral 07/2019   VENTRAL HERNIA REPAIR  1994   Current Outpatient Medications  Medication Sig Dispense Refill   atorvastatin (LIPITOR) 80 MG tablet TAKE 1 TABLET BY MOUTH EVERY DAY 90 tablet 0   cetirizine (ZYRTEC) 10 MG tablet Take 10 mg by mouth daily as needed for  allergies.      multivitamin (ONE-A-DAY MEN'S) TABS tablet Take 1 tablet by mouth daily.     Semaglutide,0.25 or 0.5MG /DOS, (OZEMPIC, 0.25 OR 0.5 MG/DOSE,) 2 MG/1.5ML SOPN Inject 0.5 mg into the skin once a week. 2 mL 0   acetaminophen (TYLENOL) 325 MG tablet Take 650 mg by mouth every 6 (six) hours as needed for moderate pain or headache.     ibuprofen (ADVIL) 200 MG tablet Take 600 mg by mouth every 8 (eight) hours as needed for mild pain or moderate pain.     lidocaine-prilocaine (EMLA) cream Apply 1 application topically as needed. Apply to [facility] 1 hour prior to procedure. 30 g 0   metFORMIN (GLUCOPHAGE) 500 MG tablet TAKE 1 TABLET (500 MG TOTAL) BY MOUTH DAILY. 90 tablet 0   Current Facility-Administered Medications  Medication Dose Route Frequency Provider Last Rate Last Admin   0.9 %  sodium chloride infusion  500 mL Intravenous Once Mansouraty, [name] Nab., MD        Current Outpatient Medications:    atorvastatin (LIPITOR) 80 MG tablet, TAKE 1 TABLET BY MOUTH EVERY DAY, Disp: 90 tablet, Rfl: 0   cetirizine (ZYRTEC) 10 MG tablet, Take 10 mg by mouth daily as needed for allergies. , Disp: , Rfl:    multivitamin (ONE-A-DAY MEN'S) TABS tablet, Take 1 tablet by mouth daily., Disp: ,  Rfl:    Semaglutide,0.25 or 0.5MG /DOS, (OZEMPIC, 0.25 OR 0.5 MG/DOSE,) 2 MG/1.5ML SOPN, Inject 0.5 mg into the skin once a week., Disp: 2 mL, Rfl: 0   acetaminophen (TYLENOL) 325 MG tablet, Take 650 mg by mouth every 6 (six) hours as needed for moderate pain or headache., Disp: , Rfl:    ibuprofen (ADVIL) 200 MG tablet, Take 600 mg by mouth every 8 (eight) hours as needed for mild pain or moderate pain., Disp: , Rfl:    lidocaine-prilocaine (EMLA) cream, Apply 1 application topically as needed. Apply to [facility] 1 hour prior to procedure., Disp: 30 g, Rfl: 0   metFORMIN (GLUCOPHAGE) 500 MG tablet, TAKE 1 TABLET (500 MG TOTAL) BY MOUTH DAILY., Disp: 90 tablet, Rfl: 0  Current Facility-Administered Medications:    0.9  %  sodium chloride infusion, 500 mL, Intravenous, Once, Mansouraty, [name] Nab., MD Allergies  Allergen Reactions   Mango Flavor Swelling and Other (See Comments)    LIPS SWELL   Rituximab Other (See Comments)   Shellfish Allergy Anaphylaxis   Lactose Intolerance (Gi) Diarrhea   Family History  Problem Relation Age of Onset   Colon cancer Neg Hx    Colon polyps Neg Hx    Esophageal cancer Neg Hx    Stomach cancer Neg Hx    Rectal cancer Neg Hx    Social History   Socioeconomic History   Marital status: Married    Spouse name: Galdino Hinchman   Number of children: 1   Years of education: Not on file   Highest education level: Not on file  Occupational History   Occupation: auditor  Tobacco Use   Smoking status: Former    Packs/day: 1.00    Years: 30.00    Pack years: 30.00    Types: Cigarettes    Quit date: 05/05/2017    Years since quitting: 3.9   Smokeless tobacco: Former    Types: Snuff    Quit date: 1997   Tobacco comments:    11/12/2015 "quit using chew in ~ 1997"  Vaping Use   Vaping Use: Never used  Substance and Sexual Activity   Alcohol use: Not Currently   Drug use: Not Currently    Types: Marijuana    Comment: "in the early 1990s; recreational"   Sexual activity: Yes    Partners: Female  Other Topics Concern   Not on file  Social History Narrative   Not on file   Social Determinants of Health   Financial Resource Strain: Not on file  Food Insecurity: Not on file  Transportation Needs: Not on file  Physical Activity: Not on file  Stress: Not on file  Social Connections: Not on file  Intimate Partner Violence: Not on file    Physical Exam: Today's Vitals   04/01/21 0722 04/01/21 0723 04/01/21 0747 04/01/21 0750  BP: 136/65  126/82 127/72  Pulse: 72  87 90  Resp:   16 10  Temp: 98 F (36.7 C) 98 F (36.7 C)    TempSrc: Skin     SpO2: 97%  98% 98%  Weight: 267 lb (121.1 kg)     Height: 5\' 11"  (1.803 m)      Body mass index is 37.24  kg/m. GEN: NAD EYE: Sclerae anicteric ENT: MMM CV: Non-tachycardic GI: Soft, NT/ND NEURO:  Alert & Oriented x 3  Lab Results: No results for input(s): WBC, HGB, HCT, PLT in the last 72 hours. BMET No results for input(s): NA, K, CL, CO2,  GLUCOSE, BUN, CREATININE, CALCIUM in the last 72 hours. LFT No results for input(s): PROT, ALBUMIN, AST, ALT, ALKPHOS, BILITOT, BILIDIR, IBILI in the last 72 hours. PT/INR No results for input(s): LABPROT, INR in the last 72 hours.   Impression / Plan: This is a 51 y.o.male who presents for Colonoscopy for screening.  The risks and benefits of endoscopic evaluation/treatment were discussed with the patient and/or family; these include but are not limited to the risk of perforation, infection, bleeding, missed lesions, lack of diagnosis, severe illness requiring hospitalization, as well as anesthesia and sedation related illnesses.  The patient's history has been reviewed, patient examined, no change in status, and deemed stable for procedure.  The patient and/or family is agreeable to proceed.    Justice Britain, MD [facility] Advanced Endoscopy Office # [phone]

## 2021-04-01 NOTE — Progress Notes (Signed)
Called to room to assist during endoscopic procedure.  Patient ID and intended procedure confirmed with present staff. Received instructions for my participation in the procedure from the performing physician.  

## 2021-04-01 NOTE — Progress Notes (Signed)
0800 HR > 100 with esmolol 25 mg given IV, MD updated, vss

## 2021-04-01 NOTE — Patient Instructions (Addendum)
Handouts provided:  High Fiber Diet, Diverticulosis and Polyps  Use FiberCon 1-2 tablets once daily  YOU HAD AN ENDOSCOPIC PROCEDURE TODAY AT [facility]:   Refer to the procedure report that was given to you for any specific questions about what was found during the examination.  If the procedure report does not answer your questions, please call your gastroenterologist to clarify.  If you requested that your care partner not be given the details of your procedure findings, then the procedure report has been included in a sealed envelope for you to review at your convenience later.  YOU SHOULD EXPECT: Some feelings of bloating in the abdomen. Passage of more gas than usual.  Walking can help get rid of the air that was put into your GI tract during the procedure and reduce the bloating. If you had a lower endoscopy (such as a colonoscopy or flexible sigmoidoscopy) you may notice spotting of blood in your stool or on the toilet paper. If you underwent a bowel prep for your procedure, you may not have a normal bowel movement for a few days.  Please Note:  You might notice some irritation and congestion in your nose or some drainage.  This is from the oxygen used during your procedure.  There is no need for concern and it should clear up in a day or so.  SYMPTOMS TO REPORT IMMEDIATELY:  Following lower endoscopy (colonoscopy or flexible sigmoidoscopy):  Excessive amounts of blood in the stool  Significant tenderness or worsening of abdominal pains  Swelling of the abdomen that is new, acute  Fever of 100F or higher  For urgent or emergent issues, a gastroenterologist can be reached at any hour by calling [phone]. Do not use MyChart messaging for urgent concerns.    DIET:  We do recommend a small meal at first, but then you may proceed to your regular diet.  Drink plenty of fluids but you should avoid alcoholic beverages for 24 hours.  ACTIVITY:  You should plan to take  it easy for the rest of today and you should NOT DRIVE or use heavy machinery until tomorrow (because of the sedation medicines used during the test).    FOLLOW UP: Our staff will call the number listed on your records 48-72 hours following your procedure to check on you and address any questions or concerns that you may have regarding the information given to you following your procedure. If we do not reach you, we will leave a message.  We will attempt to reach you two times.  During this call, we will ask if you have developed any symptoms of COVID 19. If you develop any symptoms (ie: fever, flu-like symptoms, shortness of breath, cough etc.) before then, please call [phone].  If you test positive for Covid 19 in the 2 weeks post procedure, please call and report this information to [place].    If any biopsies were taken you will be contacted by phone or by letter within the next 1-3 weeks.  Please call us at [phone] if you have not heard about the biopsies in 3 weeks.    SIGNATURES/CONFIDENTIALITY: You and/or your care partner have signed paperwork which will be entered into your electronic medical record.  These signatures attest to the fact that that the information above on your After Visit Summary has been reviewed and is understood.  Full responsibility of the confidentiality of this discharge information lies with you and/or your care-partner.

## 2021-04-01 NOTE — Progress Notes (Signed)
Report given to PACU, vss 

## 2021-04-03 ENCOUNTER — Telehealth: Payer: Self-pay | Admitting: *Deleted

## 2021-04-03 ENCOUNTER — Encounter: Payer: Self-pay | Admitting: Gastroenterology

## 2021-04-03 NOTE — Telephone Encounter (Signed)
?  Follow up Call- ? ?Call back number 04/01/2021  ?Post procedure Call Back phone  # [phone]  ?Permission to leave phone message Yes  ?Some recent data might be hidden  ? LMOM to call back with any questions or concerns.  Also, call back if patient has developed fever, respiratory issues or been dx with COVID or had any family members or close contacts diagnosed since her procedure. ? ?

## 2021-04-03 NOTE — Telephone Encounter (Signed)
?  Follow up Call- ? ?Call back number 04/01/2021  ?Post procedure Call Back phone  # [phone]  ?Permission to leave phone message Yes  ?Some recent data might be hidden  ?  ? ?Patient questions: ?Message left to call us if necessary. ?

## 2021-04-10 DIAGNOSIS — I493 Ventricular premature depolarization: Secondary | ICD-10-CM | POA: Diagnosis not present

## 2021-04-10 DIAGNOSIS — R002 Palpitations: Secondary | ICD-10-CM | POA: Diagnosis not present

## 2021-04-15 DIAGNOSIS — G4733 Obstructive sleep apnea (adult) (pediatric): Secondary | ICD-10-CM | POA: Diagnosis not present

## 2021-04-17 ENCOUNTER — Inpatient Hospital Stay: Payer: BC Managed Care – PPO

## 2021-04-17 ENCOUNTER — Inpatient Hospital Stay (HOSPITAL_BASED_OUTPATIENT_CLINIC_OR_DEPARTMENT_OTHER): Payer: BC Managed Care – PPO | Admitting: Hematology & Oncology

## 2021-04-17 ENCOUNTER — Other Ambulatory Visit: Payer: Self-pay

## 2021-04-17 ENCOUNTER — Encounter: Payer: Self-pay | Admitting: Hematology & Oncology

## 2021-04-17 ENCOUNTER — Inpatient Hospital Stay: Payer: BC Managed Care – PPO | Attending: Hematology & Oncology

## 2021-04-17 VITALS — BP 121/68 | HR 91 | Temp 98.2°F | Resp 18 | Ht 71.0 in | Wt 272.0 lb

## 2021-04-17 VITALS — BP 116/71 | HR 81

## 2021-04-17 DIAGNOSIS — C8228 Follicular lymphoma grade III, unspecified, lymph nodes of multiple sites: Secondary | ICD-10-CM | POA: Insufficient documentation

## 2021-04-17 DIAGNOSIS — Z79899 Other long term (current) drug therapy: Secondary | ICD-10-CM | POA: Diagnosis not present

## 2021-04-17 DIAGNOSIS — G4730 Sleep apnea, unspecified: Secondary | ICD-10-CM | POA: Diagnosis not present

## 2021-04-17 DIAGNOSIS — C8223 Follicular lymphoma grade III, unspecified, intra-abdominal lymph nodes: Secondary | ICD-10-CM | POA: Diagnosis not present

## 2021-04-17 DIAGNOSIS — R000 Tachycardia, unspecified: Secondary | ICD-10-CM | POA: Diagnosis not present

## 2021-04-17 DIAGNOSIS — M7989 Other specified soft tissue disorders: Secondary | ICD-10-CM | POA: Insufficient documentation

## 2021-04-17 DIAGNOSIS — Z5112 Encounter for antineoplastic immunotherapy: Secondary | ICD-10-CM | POA: Diagnosis not present

## 2021-04-17 LAB — CMP (CANCER CENTER ONLY)
ALT: 17 U/L (ref 0–44)
AST: 16 U/L (ref 15–41)
Albumin: 4.1 g/dL (ref 3.5–5.0)
Alkaline Phosphatase: 139 U/L — ABNORMAL HIGH (ref 38–126)
Anion gap: 8 (ref 5–15)
BUN: 12 mg/dL (ref 6–20)
CO2: 28 mmol/L (ref 22–32)
Calcium: 9.2 mg/dL (ref 8.9–10.3)
Chloride: 104 mmol/L (ref 98–111)
Creatinine: 1.15 mg/dL (ref 0.61–1.24)
GFR, Estimated: >60 mL/min (ref >60)
Glucose, Bld: 125 mg/dL — ABNORMAL HIGH (ref 70–99)
Potassium: 4.2 mmol/L (ref 3.5–5.1)
Sodium: 140 mmol/L (ref 135–145)
Total Bilirubin: 0.5 mg/dL (ref 0.3–1.2)
Total Protein: 6.6 g/dL (ref 6.5–8.1)

## 2021-04-17 LAB — CBC WITH DIFFERENTIAL (CANCER CENTER ONLY)
Abs Immature Granulocytes: 0.01 10*3/uL (ref 0.00–0.07)
Basophils Absolute: 0 10*3/uL (ref 0.0–0.1)
Basophils Relative: 0 %
Eosinophils Absolute: 0.1 10*3/uL (ref 0.0–0.5)
Eosinophils Relative: 3 %
HCT: 38.2 % — ABNORMAL LOW (ref 39.0–52.0)
Hemoglobin: 13 g/dL (ref 13.0–17.0)
Immature Granulocytes: 0 %
Lymphocytes Relative: 15 %
Lymphs Abs: 0.8 10*3/uL (ref 0.7–4.0)
MCH: 31 pg (ref 26.0–34.0)
MCHC: 34 g/dL (ref 30.0–36.0)
MCV: 91.2 fL (ref 80.0–100.0)
Monocytes Absolute: 0.6 10*3/uL (ref 0.1–1.0)
Monocytes Relative: 11 %
Neutro Abs: 3.8 10*3/uL (ref 1.7–7.7)
Neutrophils Relative %: 71 %
Platelet Count: 204 10*3/uL (ref 150–400)
RBC: 4.19 MIL/uL — ABNORMAL LOW (ref 4.22–5.81)
RDW: 13.1 % (ref 11.5–15.5)
WBC Count: 5.3 10*3/uL (ref 4.0–10.5)
nRBC: 0 % (ref 0.0–0.2)

## 2021-04-17 LAB — LACTATE DEHYDROGENASE: LDH: 134 U/L (ref 98–192)

## 2021-04-17 MED ORDER — HEPARIN SOD (PORK) LOCK FLUSH 100 UNIT/ML IV SOLN
500.0000 [IU] | Freq: Once | INTRAVENOUS | Status: AC | PRN
  Administered 2021-04-17: 500 [IU]

## 2021-04-17 MED ORDER — SODIUM CHLORIDE 0.9 % IV SOLN: Freq: Once | INTRAVENOUS | Status: AC

## 2021-04-17 MED ORDER — SODIUM CHLORIDE 0.9 % IV SOLN
40.0000 mg | Freq: Once | INTRAVENOUS | Status: AC
  Administered 2021-04-17: 40 mg via INTRAVENOUS
  Filled 2021-04-17: qty 4

## 2021-04-17 MED ORDER — ACETAMINOPHEN 325 MG PO TABS
650.0000 mg | ORAL_TABLET | Freq: Once | ORAL | Status: AC
  Administered 2021-04-17: 650 mg via ORAL
  Filled 2021-04-17: qty 2

## 2021-04-17 MED ORDER — DIPHENHYDRAMINE HCL 50 MG/ML IJ SOLN
12.5000 mg | Freq: Once | INTRAMUSCULAR | Status: AC
  Administered 2021-04-17: 12.5 mg via INTRAVENOUS
  Filled 2021-04-17: qty 1

## 2021-04-17 MED ORDER — SODIUM CHLORIDE 0.9 % IV SOLN
1000.0000 mg | Freq: Once | INTRAVENOUS | Status: AC
  Administered 2021-04-17: 1000 mg via INTRAVENOUS
  Filled 2021-04-17: qty 40

## 2021-04-17 MED ORDER — SODIUM CHLORIDE 0.9% FLUSH
10.0000 mL | INTRAVENOUS | Status: DC | PRN
  Administered 2021-04-17: 10 mL

## 2021-04-17 NOTE — Patient Instructions (Signed)

## 2021-04-17 NOTE — Progress Notes (Signed)
?Hematology and Oncology Follow Up Visit ? ?Brahim Dolman ?[id number] ?Sep 12, 1970 51 y.o. ?04/17/2021 ? ? ?Principle Diagnosis:  ?Follicular B- cell non-Hodgkin's lymphoma - Relapsed ?  ?Past Therapy:             ?Rituxan/bendamustine-s/p cycle 6 - completed on 07/03/2016 ?  ?Current Therapy: ? ?Gazyva  - Cytoxan/ Vincristine/Prednisone  - started 11/07/2019, s/p cycle #4 --  D/c on 03/26/2020 ?G-CHOP -- s/p cycle #6-- started on 04/03/2020 -completed on 07/13/2020 ?Maintenance Gazyva-s/p cycle 4/12 --  start on 09/05/2020 ? ?  ?Interim History:  Mr. Curto is here today for follow-up.  Since last saw him, he has been quite busy.  He now has sleep apnea.  This hopefully will help with respect to his sleeping and his breathing.  He was found to have some tachycardia when he had a sleep apnea test.  He is going to be seeing a cardiologist. ? ?He is going to [place] for a business meeting.  This will be next week.  I am sure that he will have a wonderful time. ? ?He is trying to stay active.  He and his son go bike riding.  I am so happy that he can do this with his son.  They like to go out for about 5 miles. ? ?He has had no problems with respect to fever.  He has had no cough.  Has had no shortness of breath.  He has had no change in bowel or bladder habits.  There is been no rashes.  He still has little bit of swelling in the left leg. ? ?Overall, I would say his performance status is ECOG 1.    ? ? ?Medications:  ?Allergies as of 04/17/2021   ? ?   Reactions  ? Mango Flavor Swelling, Other (See Comments)  ? LIPS SWELL  ? Rituximab Other (See Comments)  ? Shellfish Allergy Anaphylaxis  ? Lactose Intolerance (gi) Diarrhea  ? ?  ? ?  ?Medication List  ?  ? ?  ? Accurate as of April 17, 2021  8:05 AM. If you have any questions, ask your nurse or doctor.  ?  ?  ? ?  ? ?acetaminophen 325 MG tablet ?Commonly known as: TYLENOL ?Take 650 mg by mouth every 6 (six) hours as needed for moderate pain or headache. ?  ?atorvastatin 80 MG  tablet ?Commonly known as: LIPITOR ?TAKE 1 TABLET BY MOUTH EVERY DAY ?  ?cetirizine 10 MG tablet ?Commonly known as: ZYRTEC ?Take 10 mg by mouth daily as needed for allergies. ?  ?ibuprofen 200 MG tablet ?Commonly known as: ADVIL ?Take 600 mg by mouth every 8 (eight) hours as needed for mild pain or moderate pain. ?  ?lidocaine-prilocaine cream ?Commonly known as: EMLA ?Apply 1 application topically as needed. Apply to [facility] 1 hour prior to procedure. ?  ?metFORMIN 500 MG tablet ?Commonly known as: GLUCOPHAGE ?TAKE 1 TABLET (500 MG TOTAL) BY MOUTH DAILY. ?  ?multivitamin Tabs tablet ?Take 1 tablet by mouth daily. ?  ?Ozempic (0.25 or 0.5 MG/DOSE) 2 MG/1.5ML Sopn ?Generic drug: Semaglutide(0.25 or 0.'5MG'$ /DOS) ?Inject 0.5 mg into the skin once a week. ?  ?polycarbophil 625 MG tablet ?Commonly known as: FIBERCON ?Take 625 mg by mouth daily. ?  ? ?  ? ? ?Allergies:  ?Allergies  ?Allergen Reactions  ? Mango Flavor Swelling and Other (See Comments)  ?  LIPS SWELL  ? Rituximab Other (See Comments)  ? Shellfish Allergy Anaphylaxis  ? Lactose Intolerance (Gi)  Diarrhea  ? ? ?Past Medical History, Surgical history, Social history, and Family History were reviewed and updated. ? ?Review of Systems: ?Review of Systems  ?Constitutional: Negative.   ?HENT: Negative.    ?Eyes: Negative.   ?Respiratory: Negative.    ?Cardiovascular: Negative.   ?Gastrointestinal: Negative.   ?Genitourinary: Negative.   ?Musculoskeletal: Negative.   ?Skin: Negative.   ?Neurological: Negative.   ?Endo/Heme/Allergies: Negative.   ?Psychiatric/Behavioral: Negative.    ? ? ?Physical Exam: ? height is '5\' 11"'$  (1.803 m) and weight is 123.4 kg. His oral temperature is 98.2 ?F (36.8 ?C). His blood pressure is 121/68 and his pulse is 91. His respiration is 18 and oxygen saturation is 100%.  ? ?Wt Readings from Last 3 Encounters:  ?04/17/21 123.4 kg  ?04/01/21 121.1 kg  ?03/26/21 120.2 kg  ? ? ?Physical Exam ?Vitals reviewed.  ?HENT:  ?   Head: Normocephalic and  atraumatic.  ?Eyes:  ?   Pupils: Pupils are equal, round, and reactive to light.  ?Cardiovascular:  ?   Rate and Rhythm: Normal rate and regular rhythm.  ?   Heart sounds: Normal heart sounds.  ?Pulmonary:  ?   Effort: Pulmonary effort is normal.  ?   Breath sounds: Normal breath sounds.  ?Abdominal:  ?   General: Bowel sounds are normal.  ?   Palpations: Abdomen is soft.  ?Musculoskeletal:     ?   General: No tenderness or deformity. Normal range of motion.  ?   Cervical back: Normal range of motion.  ?Lymphadenopathy:  ?   Cervical: No cervical adenopathy.  ?Skin: ?   General: Skin is warm and dry.  ?   Findings: No erythema or rash.  ?Neurological:  ?   Mental Status: He is alert and oriented to person, place, and time.  ?Psychiatric:     ?   Behavior: Behavior normal.     ?   Thought Content: Thought content normal.     ?   Judgment: Judgment normal.  ? ? ? ?Lab Results  ?Component Value Date  ? WBC 5.3 04/17/2021  ? HGB 13.0 04/17/2021  ? HCT 38.2 (L) 04/17/2021  ? MCV 91.2 04/17/2021  ? PLT 204 04/17/2021  ? ?No results found for: FERRITIN, IRON, TIBC, UIBC, IRONPCTSAT ?Lab Results  ?Component Value Date  ? RETICCTPCT 1.07 12/06/2015  ? RBC 4.19 (L) 04/17/2021  ? RETICCTABS 48.79 12/06/2015  ? ?No results found for: KPAFRELGTCHN, LAMBDASER, KAPLAMBRATIO ?Lab Results  ?Component Value Date  ? IGGSERUM 644 12/25/2020  ? IGA 137 12/25/2020  ? IGMSERUM 17 (L) 12/25/2020  ? ?Lab Results  ?Component Value Date  ? TOTALPROTELP 6.1 12/25/2020  ? ALBUMINELP 3.7 12/25/2020  ? A1GS 0.2 12/25/2020  ? A2GS 0.7 12/25/2020  ? BETS 1.0 12/25/2020  ? GAMS 0.5 12/25/2020  ? MSPIKE Not Observed 12/25/2020  ? ?  Chemistry   ?   ?Component Value Date/Time  ? NA 141 02/17/2021 0908  ? NA 139 12/18/2020 0817  ? NA 141 12/03/2016 1155  ? NA 141 02/07/2016 1149  ? K 4.2 02/17/2021 0908  ? K 3.8 12/03/2016 1155  ? K 4.3 02/07/2016 1149  ? CL 105 02/17/2021 0908  ? CL 100 12/03/2016 1155  ? CO2 28 02/17/2021 0908  ? CO2 27 12/03/2016  1155  ? CO2 27 02/07/2016 1149  ? BUN 15 02/17/2021 0908  ? BUN 16 12/18/2020 0817  ? BUN 10 12/03/2016 1155  ? BUN 10.9 02/07/2016 1149  ?  CREATININE 0.96 02/17/2021 0908  ? CREATININE 1.0 12/03/2016 1155  ? CREATININE 0.9 02/07/2016 1149  ?    ?Component Value Date/Time  ? CALCIUM 9.5 02/17/2021 0908  ? CALCIUM 9.1 12/03/2016 1155  ? CALCIUM 9.6 02/07/2016 1149  ? ALKPHOS 120 02/17/2021 0908  ? ALKPHOS 99 (H) 12/03/2016 1155  ? ALKPHOS 127 02/07/2016 1149  ? AST 17 02/17/2021 0908  ? AST 17 02/07/2016 1149  ? ALT 20 02/17/2021 0908  ? ALT 24 12/03/2016 1155  ? ALT 16 02/07/2016 1149  ? BILITOT 0.7 02/17/2021 0908  ? BILITOT 0.42 02/07/2016 1149  ?  ? ? ? ?Impression and Plan: Mr. Grape is a pleasant 51 yo caucasian gentleman with history of follicular large cell non-Hodgkin's lymphoma. He completed 6 cycles of chemotherapy with bendamustine on 07/03/2016 but was not tolerant of Rituxan (anaphylaxis). His lymphoma has  recurred.  We then treated him with CHOP.  This got him into remission. ? ?He is on maintenance Gazyva now.  This was his fifth cycle.  He is doing well right now.  I do not see any evidence that there is any kind of progression. ? ?We will plan to get him back in another 2 months. ? ? ? ?[name], MD ?3/16/20238:05 AM ?

## 2021-04-17 NOTE — Patient Instructions (Signed)
[facility]  Discharge Instructions: ?Thank you for choosing [facility] to provide your oncology and hematology care.  ? ?If you have a lab appointment with the Cancer Center, please go directly to the Cancer Center and check in at the registration area. ? ?Wear comfortable clothing and clothing appropriate for easy access to any Portacath or PICC line.  ? ?We strive to give you quality time with your provider. You may need to reschedule your appointment if you arrive late (15 or more minutes).  Arriving late affects you and other patients whose appointments are after yours.  Also, if you miss three or more appointments without notifying the office, you may be dismissed from the clinic at the provider?s discretion.    ?  ?For prescription refill requests, have your pharmacy contact our office and allow 72 hours for refills to be completed.   ? ?Today you received the following chemotherapy and/or immunotherapy agents Gazyva.    ?  ?To help prevent nausea and vomiting after your treatment, we encourage you to take your nausea medication as directed. ? ?BELOW ARE SYMPTOMS THAT SHOULD BE REPORTED IMMEDIATELY: ?*FEVER GREATER THAN 100.4 F (38 ?C) OR HIGHER ?*CHILLS OR SWEATING ?*NAUSEA AND VOMITING THAT IS NOT CONTROLLED WITH YOUR NAUSEA MEDICATION ?*UNUSUAL SHORTNESS OF BREATH ?*UNUSUAL BRUISING OR BLEEDING ?*URINARY PROBLEMS (pain or burning when urinating, or frequent urination) ?*BOWEL PROBLEMS (unusual diarrhea, constipation, pain near the anus) ?TENDERNESS IN MOUTH AND THROAT WITH OR WITHOUT PRESENCE OF ULCERS (sore throat, sores in mouth, or a toothache) ?UNUSUAL RASH, SWELLING OR PAIN  ?UNUSUAL VAGINAL DISCHARGE OR ITCHING  ? ?Items with * indicate a potential emergency and should be followed up as soon as possible or go to the Emergency Department if any problems should occur. ? ?Please show the CHEMOTHERAPY ALERT CARD or IMMUNOTHERAPY ALERT CARD at check-in to the  Emergency Department and triage nurse. ?Should you have questions after your visit or need to cancel or reschedule your appointment, please contact [facility]  [phone] and follow the prompts.  Office hours are 8:00 a.m. to 4:30 p.m. Monday - Friday. Please note that voicemails left after 4:00 p.m. may not be returned until the following business day.  We are closed weekends and major holidays. You have access to a nurse at all times for urgent questions. Please call the main number to the clinic [phone] and follow the prompts. ? ?For any non-urgent questions, you may also contact your provider using MyChart. We now offer e-Visits for anyone 18 and older to request care online for non-urgent symptoms. For details visit [email or web address]. ?  ?Also download the MyChart app! Go to the app store, search "MyChart", open the app, select , and log in with your MyChart username and password. ? ?Due to Covid, a mask is required upon entering the hospital/clinic. If you do not have a mask, one will be given to you upon arrival. For doctor visits, patients may have 1 support person aged 18 or older with them. For treatment visits, patients cannot have anyone with them due to current Covid guidelines and our immunocompromised population.  ?

## 2021-04-20 ENCOUNTER — Other Ambulatory Visit (INDEPENDENT_AMBULATORY_CARE_PROVIDER_SITE_OTHER): Payer: Self-pay | Admitting: Family Medicine

## 2021-04-20 DIAGNOSIS — E1165 Type 2 diabetes mellitus with hyperglycemia: Secondary | ICD-10-CM

## 2021-04-28 ENCOUNTER — Encounter (INDEPENDENT_AMBULATORY_CARE_PROVIDER_SITE_OTHER): Payer: Self-pay | Admitting: Family Medicine

## 2021-04-28 ENCOUNTER — Other Ambulatory Visit: Payer: Self-pay

## 2021-04-28 ENCOUNTER — Ambulatory Visit (INDEPENDENT_AMBULATORY_CARE_PROVIDER_SITE_OTHER): Payer: BC Managed Care – PPO | Admitting: Family Medicine

## 2021-04-28 VITALS — BP 103/68 | HR 92 | Temp 97.7°F | Ht 71.0 in | Wt 269.0 lb

## 2021-04-28 DIAGNOSIS — Z7985 Long-term (current) use of injectable non-insulin antidiabetic drugs: Secondary | ICD-10-CM

## 2021-04-28 DIAGNOSIS — J3089 Other allergic rhinitis: Secondary | ICD-10-CM

## 2021-04-28 DIAGNOSIS — E1165 Type 2 diabetes mellitus with hyperglycemia: Secondary | ICD-10-CM

## 2021-04-28 DIAGNOSIS — Z6837 Body mass index (BMI) 37.0-37.9, adult: Secondary | ICD-10-CM | POA: Diagnosis not present

## 2021-04-28 DIAGNOSIS — E669 Obesity, unspecified: Secondary | ICD-10-CM

## 2021-04-28 MED ORDER — SEMAGLUTIDE (1 MG/DOSE) 4 MG/3ML ~~LOC~~ SOPN: 1.0000 mg | PEN_INJECTOR | SUBCUTANEOUS | 0 refills | Status: DC

## 2021-04-29 NOTE — Progress Notes (Signed)
? ? ? ?Chief Complaint:  ? ?OBESITY ?James Jennings is here to discuss his progress with his obesity treatment plan along with follow-up of his obesity related diagnoses. James Jennings is on keeping a food journal and adhering to recommended goals of 2000 to 2200 calories and 140+ grams of protein and states he is following his eating plan approximately 75% of the time. James Jennings states he is walking 10 to 15 minutes 7 times per week and riding his bike for 10 miles 2 times per week. ? ?Today's visit was #: 3 ?Starting weight: 277 lbs ?Starting date: 03/14/2020 ?Today's weight: 269 lbs ?Today's date: 04/28/2021 ?Total lbs lost to date: 8 ?Total lbs lost since last in-office visit: 0 ? ?Interim History: James Jennings has been working and traveling. He traveled to [place] for work and recognizes that he did not make the most nutritious choices. James Jennings wishes that he could incorporate burgers and fries more consistently. He does not have plans to go anywhere in the next 3 to 4 months. James Jennings has gotten away from eating 3 times a day and skips breakfast. ? ?Subjective:  ? ?1. Type 2 diabetes mellitus with hyperglycemia, without long-term current use of insulin (James Jennings) ?Medications reviewed. James Jennings is on Ozempic and metformin. His last A1c was 6.4 and Insulin was 22.6 in November 2022. ? ?Lab Results  ?Component Value Date  ? HGBA1C 6.4 (H) 12/18/2020  ? HGBA1C 7.1 (H) 06/25/2020  ? HGBA1C 8.2 (H) 03/14/2020  ? ?Lab Results  ?Component Value Date  ? [place] 110 (H) 12/18/2020  ? CREATININE 1.15 04/17/2021  ? ?Lab Results  ?Component Value Date  ? INSULIN 22.6 12/18/2020  ? INSULIN 35.2 (H) 06/25/2020  ? INSULIN 28.0 (H) 03/14/2020  ? INSULIN 24.6 08/10/2018  ? ?2. Allergic rhinitis due to other allergic trigger, unspecified seasonality ?James Jennings takes Zyrtec daily. He doesn't take Flonase or intranasal steroid daily. ? ?Assessment/Plan:  ? ?1. Type 2 diabetes mellitus with hyperglycemia, without long-term current use of insulin ([place]) ?Good blood sugar control is  important to decrease the likelihood of diabetic complications such as nephropathy, neuropathy, limb loss, blindness, coronary artery disease, and death. Intensive lifestyle modification including diet, exercise, and weight loss are the first line of treatment for diabetes.  ? ?- Semaglutide, 1 MG/DOSE, 4 MG/3ML SOPN; Inject 1 mg as directed once a week.  Dispense: 3 mL; Refill: 0 ? ?2. Allergic rhinitis due to other allergic trigger, unspecified seasonality ?James Jennings knows that he can add intranasal steroid as needed. ? ?3. Obesity with current BMI of 37.6 ?James Jennings is currently in the action stage of change. As such, his goal is to continue with weight loss efforts. He has agreed to keeping a food journal and adhering to recommended goals of 2000 to 2200 calories and 140+ grams of protein.  ? ?Exercise goals: All adults should avoid inactivity. Some physical activity is better than none, and adults who participate in any amount of physical activity gain some health benefits. ? ?Behavioral modification strategies: increasing lean protein intake, meal planning and cooking strategies, keeping healthy foods in the home, planning for success, and keeping a strict food journal. ? ?James Jennings has agreed to follow-up with our clinic in 3 weeks. He was informed of the importance of frequent follow-up visits to maximize his success with intensive lifestyle modifications for his multiple health conditions.  ? ?Objective:  ? ?Blood pressure 103/68, pulse 92, temperature 97.7 ?F (36.5 ?C), height '5\' 11"'$  (1.803 m), weight 269 lb (122 kg), SpO2 98 %. ?Body  mass index is 37.52 kg/m?. ? ?General: Cooperative, alert, well developed, in no acute distress. ?HEENT: Conjunctivae and lids unremarkable. ?Cardiovascular: Regular rhythm.  ?Lungs: Normal work of breathing. ?Neurologic: No focal deficits.  ? ?Lab Results  ?Component Value Date  ? CREATININE 1.15 04/17/2021  ? BUN 12 04/17/2021  ? NA 140 04/17/2021  ? K 4.2 04/17/2021  ? CL 104 04/17/2021   ? CO2 28 04/17/2021  ? ?Lab Results  ?Component Value Date  ? ALT 17 04/17/2021  ? AST 16 04/17/2021  ? ALKPHOS 139 (H) 04/17/2021  ? BILITOT 0.5 04/17/2021  ? ?Lab Results  ?Component Value Date  ? HGBA1C 6.4 (H) 12/18/2020  ? HGBA1C 7.1 (H) 06/25/2020  ? HGBA1C 8.2 (H) 03/14/2020  ? HGBA1C 6.2 (H) 08/10/2018  ? ?Lab Results  ?Component Value Date  ? INSULIN 22.6 12/18/2020  ? INSULIN 35.2 (H) 06/25/2020  ? INSULIN 28.0 (H) 03/14/2020  ? INSULIN 24.6 08/10/2018  ? ?Lab Results  ?Component Value Date  ? TSH 2.603 07/16/2020  ? ?Lab Results  ?Component Value Date  ? CHOL 171 12/18/2020  ? HDL 42 12/18/2020  ? [place] 110 (H) 12/18/2020  ? TRIG 101 12/18/2020  ? CHOLHDL 4.5 06/25/2020  ? ?Lab Results  ?Component Value Date  ? VD25OH 50.5 12/18/2020  ? VD25OH 42.9 06/25/2020  ? VD25OH 41.3 03/14/2020  ? ?Lab Results  ?Component Value Date  ? WBC 5.3 04/17/2021  ? HGB 13.0 04/17/2021  ? HCT 38.2 (L) 04/17/2021  ? MCV 91.2 04/17/2021  ? PLT 204 04/17/2021  ? ?No results found for: IRON, TIBC, FERRITIN ? ? ?Attestation Statements:  ? ?Reviewed by clinician on day of visit: allergies, medications, problem list, medical history, surgical history, family history, social history, and previous encounter notes. ? ? ? ?I, Marcille Blanco, CMA, am acting as transcriptionist for Coralie Common, MD ?I have reviewed the above documentation for accuracy and completeness, and I agree with the above. Coralie Common, MD ? ?

## 2021-05-08 ENCOUNTER — Encounter: Payer: Self-pay | Admitting: Cardiology

## 2021-05-08 ENCOUNTER — Ambulatory Visit (INDEPENDENT_AMBULATORY_CARE_PROVIDER_SITE_OTHER): Payer: BC Managed Care – PPO | Admitting: Cardiology

## 2021-05-08 DIAGNOSIS — C8223 Follicular lymphoma grade III, unspecified, intra-abdominal lymph nodes: Secondary | ICD-10-CM | POA: Diagnosis not present

## 2021-05-08 DIAGNOSIS — R002 Palpitations: Secondary | ICD-10-CM | POA: Diagnosis not present

## 2021-05-08 NOTE — Assessment & Plan Note (Signed)
Currently in remission.  Oncology notes reviewed.  Excellent. ?

## 2021-05-08 NOTE — Progress Notes (Signed)
?Cardiology Office Note:   ? ?Date:  05/08/2021  ? ?ID:  James Jennings, DOB 08-08-1970, MRN [id number] ? ?PCP:  James Lopes, MD ?  ?[facility] Providers ?Cardiologist:  None    ? ?Referring MD: James Lopes, MD  ? ?History of Present Illness:   ? ?James Jennings is a 51 y.o. male here for the evaluation of PVC's and  tachycardia,  at the request of Dr. Leanna Jennings.  ? ?Today: ?Overall, he appears to be doing well. ? ?Currently he is in remission from having cancer. He experiences an increase of dizziness when standing up, moreso since his chemotherapy. When he travels up stairs he has sob. While he was going through chemotherapy he had not been as active.  ? ?He had a sleep study in the past and his heart rate increased from 123 -128 bpm while he was asleep. He does have sleep apnea and is treating it. He uses a mouthpiece instead of a CPAP. He has no snoring or PND while using the mouthpiece. ? ?Has LE edema on left ankle. This occurs daily, but usually improves by the morning. ? ?He received an electrocardiogram in the past.  ? ?He denies any palpitations, chest pain. No headaches, syncope, orthopnea, or PND. ? ? ? ?Past Medical History:  ?Diagnosis Date  ? Anxiety   ? Bilateral swelling of feet   ? Diabetes mellitus without complication ([place])   ? High cholesterol   ? Joint pain   ? Lactose intolerance   ? Large cell, follicular non-Hodgkin's lymphoma (James Jennings)   ? Lymphoma, follicular (James Jennings) dx'd 78/2956  ? Multiple food allergies   ? Other fatigue   ? PONV (postoperative nausea and vomiting)   ? Pre-diabetes   ? Prediabetes   ? Shortness of breath on exertion   ? Sleep apnea   ? "dx'd ~ 2008; never RX'd mask" (02/28/2016)  ? ? ?Past Surgical History:  ?Procedure Laterality Date  ? ACHILLES TENDON SURGERY Left ~ 2012  ? APPENDECTOMY  11/12/2015  ? lap appy  ? CLUB FOOT RELEASE Bilateral 1971  ? HERNIA REPAIR    ? INGUINAL LYMPH NODE BIOPSY Right 01/20/2016  ? Procedure: EXCISIONAL BIOPSY OF RIGHT INGUINAL  LYMPH NODE;  Surgeon: James Pickerel, MD;  Location: [facility];  Service: General;  Laterality: Right;  ? IR IMAGING GUIDED PORT INSERTION  11/02/2019  ? LAPAROSCOPIC APPENDECTOMY N/A 11/12/2015  ? Procedure: APPENDECTOMY LAPAROSCOPIC;  Surgeon: James Pickerel, MD;  Location: [facility];  Service: General;  Laterality: N/A;  ? LYMPH NODE BIOPSY Left 03/20/2020  ? Procedure: EXCISIONAL BIOPSY LEFT INGUINAL LYMPH NODE;  Surgeon: James Keens, MD;  Location: [facility];  Service: General;  Laterality: Left;  LMA  ? SUTURE REMOVAL Left ~ 2016-2017 X 3  ? "had to use permanent sutures w/my achilles OR; my body rejects them at times & I have to have them surgically removed; under anesthesia"  ? VASECTOMY Bilateral 07/2019  ? James Jennings  ? ? ?Current Medications: ?Current Meds  ?Medication Sig  ? acetaminophen (TYLENOL) 325 MG tablet Take 650 mg by mouth every 6 (six) hours as needed for moderate pain or headache.  ? atorvastatin (LIPITOR) 80 MG tablet TAKE 1 TABLET BY MOUTH EVERY DAY  ? cetirizine (ZYRTEC) 10 MG tablet Take 10 mg by mouth daily as needed for allergies.   ? ibuprofen (ADVIL) 200 MG tablet Take 600 mg by mouth every 8 (eight) hours as needed for mild pain  or moderate pain.  ? lidocaine-prilocaine (EMLA) cream Apply 1 application topically as needed. Apply to [facility] 1 hour prior to procedure.  ? metFORMIN (GLUCOPHAGE) 500 MG tablet TAKE 1 TABLET (500 MG TOTAL) BY MOUTH DAILY.  ? multivitamin (ONE-A-DAY MEN'S) TABS tablet Take 1 tablet by mouth daily.  ? polycarbophil (FIBERCON) 625 MG tablet Take 625 mg by mouth daily.  ? Semaglutide, 1 MG/DOSE, 4 MG/3ML SOPN Inject 1 mg as directed once a week.  ?  ? ?Allergies:   Mango flavor, Rituximab, Shellfish allergy, and Lactose intolerance (gi)  ? ?Social History  ? ?Socioeconomic History  ? Marital status: Married  ?  Spouse name: Irving Bloor  ? Number of children: 1  ? Years of education: Not on file  ? Highest education level: Not on file  ?Occupational History  ?  Occupation: auditor  ?Tobacco Use  ? Smoking status: Former  ?  Packs/day: 1.00  ?  Years: 30.00  ?  Pack years: 30.00  ?  Types: Cigarettes  ?  Quit date: 05/05/2017  ?  Years since quitting: 4.0  ? Smokeless tobacco: Former  ?  Types: Snuff  ?  Quit date: 1997  ? Tobacco comments:  ?  11/12/2015 "quit using chew in ~ 1997"  ?Vaping Use  ? Vaping Use: Never used  ?Substance and Sexual Activity  ? Alcohol use: Not Currently  ? Drug use: Not Currently  ?  Types: Marijuana  ?  Comment: "in the early 1990s; recreational"  ? Sexual activity: Yes  ?  Partners: Female  ?Other Topics Concern  ? Not on file  ?Social History Narrative  ? Not on file  ? ?Social Determinants of Health  ? ?Financial Resource Strain: Not on file  ?Food Insecurity: Not on file  ?Transportation Needs: Not on file  ?Physical Activity: Not on file  ?Stress: Not on file  ?Social Connections: Not on file  ?  ? ?Family History: ?The patient's family history is negative for Colon cancer, Colon polyps, Esophageal cancer, Stomach cancer, and Rectal cancer. ? ?ROS:   ?Please see the history of present illness.    ?(+) Dizziness ?(+) Shortness of breath ?(+) Edema ?All other systems reviewed and are negative. ? ?EKGs/Labs/Other Studies Reviewed:   ? ?The following studies were reviewed today: ? ?Echo 04/01/2020: ?IMPRESSIONS  ? ? 1. Global longitudinal strain is -22.8%. Left ventricular ejection  ?fraction, by estimation, is 70 to 75%. The left ventricle has hyperdynamic  ?function. The left ventricle has no regional wall motion abnormalities.  ?Left ventricular diastolic parameters  ?are indeterminate.  ? 2. Right ventricular systolic function is normal. The right ventricular  ?size is normal.  ? 3. The mitral valve is grossly normal. Trivial mitral valve  ?regurgitation.  ? 4. The aortic valve is abnormal. Aortic valve regurgitation is not  ?visualized. Mild aortic valve sclerosis is present, with no evidence of  ?aortic valve stenosis.  ? 5. The inferior  vena cava is normal in size with greater than 50%  ?respiratory variability, suggesting right atrial pressure of 3 mmHg. ? ?CT Chest 12/03/2016: ?FINDINGS: ?Cardiovascular: Normal heart size.  No pericardial effusion. ?  ?Mediastinum/Nodes: The trachea appears patent and is midline. Normal ?appearance of the esophagus. No adenopathy. Calcified mediastinal ?and hilar lymph nodes identified. ?  ?Lungs/Pleura: No pleural effusion. Moderate changes of paraseptal ?emphysema. Calcified granuloma is identified within the left lower ?lobe. Within the both upper lobes there are clustered areas of ?peribronchovascular tree in bud nodularity.  Which appear new from ?the previous exam. There is a small nonspecific pulmonary nodule ?which appears new from previous PET-CT measuring 5 mm, image 127 of ?series 3. ?  ?Upper Abdomen: No acute abnormality. ?  ?Musculoskeletal: No aggressive lytic or sclerotic bone lesions. ?  ?IMPRESSION: ?1. Calcified granuloma in the left lower lobe corresponds with the ?nodule identified on recent chest radiograph. ?2. New small nonspecific nodule in the right base measures 5 mm ?Non-contrast chest CT can be considered in 12 months if patient is ?high-risk. This recommendation follows the consensus statement: ?Guidelines for Management of Incidental Pulmonary Nodules Detected ?on CT Images: From the Fleischner Society 2017; Radiology 2017; ?284:228-243. ?3. New clustered areas of peribronchovascular tree-in-bud nodularity ?within the upper lung zones compatible with infectious or ?inflammatory bronchiolitis. ? ? ?EKG:  EKG is personally reviewed and interpreted. ? ? ?Recent Labs: ?07/16/2020: TSH 2.603 ?04/17/2021: ALT 17; BUN 12; Creatinine 1.15; Hemoglobin 13.0; Platelet Count 204; Potassium 4.2; Sodium 140  ? ?Recent Lipid Panel ?   ?Component Value Date/Time  ? CHOL 171 12/18/2020 0817  ? TRIG 101 12/18/2020 0817  ? HDL 42 12/18/2020 0817  ? CHOLHDL 4.5 06/25/2020 0722  ? [place] 110 (H)  12/18/2020 0817  ? ? ? ?    ? ?Physical Exam:   ? ?VS:  BP 134/72 (BP Location: Left Arm, Patient Position: Sitting, Cuff Size: Large)   Pulse 76   Ht '5\' 11"'$  (1.803 m)   Wt 271 lb (122.9 kg)   SpO2 99%   BMI 37.80

## 2021-05-08 NOTE — Assessment & Plan Note (Signed)
HR 128 while sleeping. Cancer (lymphoma follicular) in remission. Uses mouth guard. Helping. No chest pain. But when standing up gets dizzy at times.  Overall he has not felt any excessive racing during the day.  No obvious heart arrhythmias. ? ?Echocardiogram last year showed EF of 70%.  Excellent.  Trace mitral regurgitation.  Benign. ? ?At this point, no further cardiac testing is needed.  If he begins to have any symptoms that are more worrisome, we can always consider repeat echocardiogram or perhaps Zio patch monitor. ? ?Continue with treatment for sleep apnea.  Weight loss.  Diet, exercise.  No other changes needed.  We will see back as needed. ?

## 2021-05-08 NOTE — Patient Instructions (Signed)
Medication Instructions:  ?The current medical regimen is effective;  continue present plan and medications. ? ?*If you need a refill on your cardiac medications before your next appointment, please call your pharmacy* ? ?Follow-Up: ?At [facility], you and your health needs are our priority.  As part of our continuing mission to provide you with exceptional heart care, we have created designated Provider Care Teams.  These Care Teams include your primary Cardiologist (physician) and Advanced Practice Providers (APPs -  Physician Assistants and Nurse Practitioners) who all work together to provide you with the care you need, when you need it. ? ?We recommend signing up for the patient portal called "MyChart".  Sign up information is provided on this After Visit Summary.  MyChart is used to connect with patients for Virtual Visits (Telemedicine).  Patients are able to view lab/test results, encounter notes, upcoming appointments, etc.  Non-urgent messages can be sent to your provider as well.   ?To learn more about what you can do with MyChart, go to [email or web address].   ? ?Your next appointment:   ?Follow up as needed. ? ?Thank you for choosing [facility]!! ? ? ? ?

## 2021-05-15 DIAGNOSIS — E119 Type 2 diabetes mellitus without complications: Secondary | ICD-10-CM | POA: Diagnosis not present

## 2021-05-19 DIAGNOSIS — G4733 Obstructive sleep apnea (adult) (pediatric): Secondary | ICD-10-CM | POA: Diagnosis not present

## 2021-05-20 ENCOUNTER — Encounter (INDEPENDENT_AMBULATORY_CARE_PROVIDER_SITE_OTHER): Payer: Self-pay | Admitting: Family Medicine

## 2021-05-20 ENCOUNTER — Ambulatory Visit (INDEPENDENT_AMBULATORY_CARE_PROVIDER_SITE_OTHER): Payer: BC Managed Care – PPO | Admitting: Family Medicine

## 2021-05-20 VITALS — BP 106/64 | HR 81 | Temp 97.9°F | Ht 71.0 in | Wt 265.0 lb

## 2021-05-20 DIAGNOSIS — E1169 Type 2 diabetes mellitus with other specified complication: Secondary | ICD-10-CM

## 2021-05-20 DIAGNOSIS — G4733 Obstructive sleep apnea (adult) (pediatric): Secondary | ICD-10-CM | POA: Diagnosis not present

## 2021-05-20 DIAGNOSIS — E669 Obesity, unspecified: Secondary | ICD-10-CM | POA: Diagnosis not present

## 2021-05-20 DIAGNOSIS — E785 Hyperlipidemia, unspecified: Secondary | ICD-10-CM | POA: Diagnosis not present

## 2021-05-20 DIAGNOSIS — Z6837 Body mass index (BMI) 37.0-37.9, adult: Secondary | ICD-10-CM

## 2021-05-20 DIAGNOSIS — Z7985 Long-term (current) use of injectable non-insulin antidiabetic drugs: Secondary | ICD-10-CM

## 2021-05-20 DIAGNOSIS — E1165 Type 2 diabetes mellitus with hyperglycemia: Secondary | ICD-10-CM

## 2021-05-20 MED ORDER — SEMAGLUTIDE (1 MG/DOSE) 4 MG/3ML ~~LOC~~ SOPN: 1.0000 mg | PEN_INJECTOR | SUBCUTANEOUS | 0 refills | Status: DC

## 2021-05-28 NOTE — Progress Notes (Signed)
Chief Complaint:   OBESITY James Jennings is here to discuss his progress with his obesity treatment plan along with follow-up of his obesity related diagnoses. James Jennings is on keeping a food journal and adhering to recommended goals of 2,000 to 2,200 calories and 140+ grams of protein and states he is following his eating plan approximately 80 to 85% of the time. James Jennings states he is walking 30 minutes 2 times per week and riding his bike for 2 to 10 miles 2 times per week.  Today's visit was #: 16 Starting weight: 277 lbs Starting date: 03/14/2020 Today's weight: 265 lbs Today's date: 05/20/2021 Total lbs lost to date: 12 Total lbs lost since last in-office visit: 4  Interim History: James Jennings has been mostly working on following his plan and reading. He has been 80 to 85% on plan. James Jennings is trying to stay under 2,166 calories daily. Protein wise for the day, he is low on intake. James Jennings and his wife are walking daily with his wife.  Subjective:   1. Type 2 diabetes mellitus with hyperglycemia, without long-term current use of insulin ([name]) Theoren's A1c was 6.1 and Insulin was 22.6. [name] is on Ozempic weekly at '1mg'$ ; he has had some GI side effects after increased carb indulgences.  2. Hyperlipidemia associated with type 2 diabetes mellitus ([place]) Sedric's last labs were done in November of 2022. He is on Lipitor and denies transaminitis or myalgias.  Assessment/Plan:   1. Type 2 diabetes mellitus with hyperglycemia, without long-term current use of insulin ([name]) Good blood sugar control is important to decrease the likelihood of diabetic complications such as nephropathy, neuropathy, limb loss, blindness, coronary artery disease, and death. Intensive lifestyle modification including diet, exercise and weight loss are the first line of treatment for diabetes. Labs will be rechecked in 3 months.  - Semaglutide, 1 MG/DOSE, 4 MG/3ML SOPN; Inject 1 mg as directed once a week.  Dispense: 3 mL; Refill: 0  2.  Hyperlipidemia associated with type 2 diabetes mellitus ([place]) Cardiovascular risk and specific lipid/LDL goals reviewed.  We discussed several lifestyle modifications today and [place] will continue to work on diet, exercise and weight loss efforts. Orders and follow up as documented in patient record. [place] agrees to continue Lipitor with no refills needed.  Counseling Intensive lifestyle modifications are the first line treatment for this issue. Dietary changes: Increase soluble fiber. Decrease simple carbohydrates. Exercise changes: Moderate to vigorous-intensity aerobic activity 150 minutes per week if tolerated. Lipid-lowering medications: see documented in medical record.  3. Obesity with current BMI of 37.1 James Jennings is currently in the action stage of change. As such, his goal is to continue with weight loss efforts. He has agreed to keeping a food journal and adhering to recommended goals of 2,000 to 2,200 calories and 140+ grams of protein.   Exercise goals:  As is.  Behavioral modification strategies: increasing lean protein intake, meal planning and cooking strategies, keeping healthy foods in the home, planning for success, and keeping a strict food journal.  James Jennings has agreed to follow-up with our clinic in 4 weeks. He was informed of the importance of frequent follow-up visits to maximize his success with intensive lifestyle modifications for his multiple health conditions.   Objective:   Blood pressure 106/64, pulse 81, temperature 97.9 F (36.6 C), height '5\' 11"'$  (1.803 m), weight 265 lb (120.2 kg), SpO2 99 %. Body mass index is 36.96 kg/m.  General: Cooperative, alert, well developed, in no acute distress. HEENT: Conjunctivae and  lids unremarkable. Cardiovascular: Regular rhythm.  Lungs: Normal work of breathing. Neurologic: No focal deficits.   Lab Results  Component Value Date   CREATININE 1.15 04/17/2021   BUN 12 04/17/2021   NA 140 04/17/2021   K 4.2 04/17/2021   CL 104  04/17/2021   CO2 28 04/17/2021   Lab Results  Component Value Date   ALT 17 04/17/2021   AST 16 04/17/2021   ALKPHOS 139 (H) 04/17/2021   BILITOT 0.5 04/17/2021   Lab Results  Component Value Date   HGBA1C 6.4 (H) 12/18/2020   HGBA1C 7.1 (H) 06/25/2020   HGBA1C 8.2 (H) 03/14/2020   HGBA1C 6.2 (H) 08/10/2018   Lab Results  Component Value Date   INSULIN 22.6 12/18/2020   INSULIN 35.2 (H) 06/25/2020   INSULIN 28.0 (H) 03/14/2020   INSULIN 24.6 08/10/2018   Lab Results  Component Value Date   TSH 2.603 07/16/2020   Lab Results  Component Value Date   CHOL 171 12/18/2020   HDL 42 12/18/2020   LDLCALC 110 (H) 12/18/2020   TRIG 101 12/18/2020   CHOLHDL 4.5 06/25/2020   Lab Results  Component Value Date   VD25OH 50.5 12/18/2020   VD25OH 42.9 06/25/2020   VD25OH 41.3 03/14/2020   Lab Results  Component Value Date   WBC 5.3 04/17/2021   HGB 13.0 04/17/2021   HCT 38.2 (L) 04/17/2021   MCV 91.2 04/17/2021   PLT 204 04/17/2021   No results found for: IRON, TIBC, FERRITIN  Attestation Statements:   Reviewed by clinician on day of visit: allergies, medications, problem list, medical history, surgical history, family history, social history, and previous encounter notes.  IMarcille Blanco, CMA, am acting as transcriptionist for Coralie Common, MD I have reviewed the above documentation for accuracy and completeness, and I agree with the above. - Coralie Common, MD

## 2021-06-13 ENCOUNTER — Other Ambulatory Visit (INDEPENDENT_AMBULATORY_CARE_PROVIDER_SITE_OTHER): Payer: Self-pay | Admitting: Family Medicine

## 2021-06-13 DIAGNOSIS — E7849 Other hyperlipidemia: Secondary | ICD-10-CM

## 2021-06-16 ENCOUNTER — Inpatient Hospital Stay: Payer: BC Managed Care – PPO | Attending: Hematology & Oncology

## 2021-06-16 ENCOUNTER — Encounter: Payer: Self-pay | Admitting: Hematology & Oncology

## 2021-06-16 ENCOUNTER — Inpatient Hospital Stay: Payer: BC Managed Care – PPO

## 2021-06-16 ENCOUNTER — Inpatient Hospital Stay (HOSPITAL_BASED_OUTPATIENT_CLINIC_OR_DEPARTMENT_OTHER): Payer: BC Managed Care – PPO | Admitting: Hematology & Oncology

## 2021-06-16 VITALS — BP 115/73 | HR 87 | Temp 98.6°F | Resp 19 | Wt 262.5 lb

## 2021-06-16 DIAGNOSIS — C8223 Follicular lymphoma grade III, unspecified, intra-abdominal lymph nodes: Secondary | ICD-10-CM | POA: Insufficient documentation

## 2021-06-16 DIAGNOSIS — Z5112 Encounter for antineoplastic immunotherapy: Secondary | ICD-10-CM | POA: Insufficient documentation

## 2021-06-16 DIAGNOSIS — Z79899 Other long term (current) drug therapy: Secondary | ICD-10-CM | POA: Diagnosis not present

## 2021-06-16 DIAGNOSIS — G4730 Sleep apnea, unspecified: Secondary | ICD-10-CM | POA: Insufficient documentation

## 2021-06-16 LAB — CBC WITH DIFFERENTIAL (CANCER CENTER ONLY)
Abs Immature Granulocytes: 0.01 10*3/uL (ref 0.00–0.07)
Basophils Absolute: 0 10*3/uL (ref 0.0–0.1)
Basophils Relative: 0 %
Eosinophils Absolute: 0.1 10*3/uL (ref 0.0–0.5)
Eosinophils Relative: 3 %
HCT: 37.6 % — ABNORMAL LOW (ref 39.0–52.0)
Hemoglobin: 12.8 g/dL — ABNORMAL LOW (ref 13.0–17.0)
Immature Granulocytes: 0 %
Lymphocytes Relative: 18 %
Lymphs Abs: 0.8 10*3/uL (ref 0.7–4.0)
MCH: 30.8 pg (ref 26.0–34.0)
MCHC: 34 g/dL (ref 30.0–36.0)
MCV: 90.6 fL (ref 80.0–100.0)
Monocytes Absolute: 0.6 10*3/uL (ref 0.1–1.0)
Monocytes Relative: 14 %
Neutro Abs: 2.9 10*3/uL (ref 1.7–7.7)
Neutrophils Relative %: 65 %
Platelet Count: 195 10*3/uL (ref 150–400)
RBC: 4.15 MIL/uL — ABNORMAL LOW (ref 4.22–5.81)
RDW: 12.9 % (ref 11.5–15.5)
Smear Review: NORMAL
WBC Count: 4.5 10*3/uL (ref 4.0–10.5)
nRBC: 0 % (ref 0.0–0.2)

## 2021-06-16 LAB — CMP (CANCER CENTER ONLY)
ALT: 29 U/L (ref 0–44)
AST: 23 U/L (ref 15–41)
Albumin: 4.3 g/dL (ref 3.5–5.0)
Alkaline Phosphatase: 121 U/L (ref 38–126)
Anion gap: 7 (ref 5–15)
BUN: 12 mg/dL (ref 6–20)
CO2: 27 mmol/L (ref 22–32)
Calcium: 9.2 mg/dL (ref 8.9–10.3)
Chloride: 104 mmol/L (ref 98–111)
Creatinine: 1.1 mg/dL (ref 0.61–1.24)
GFR, Estimated: >60 mL/min (ref >60)
Glucose, Bld: 107 mg/dL — ABNORMAL HIGH (ref 70–99)
Potassium: 3.7 mmol/L (ref 3.5–5.1)
Sodium: 138 mmol/L (ref 135–145)
Total Bilirubin: 0.6 mg/dL (ref 0.3–1.2)
Total Protein: 6.3 g/dL — ABNORMAL LOW (ref 6.5–8.1)

## 2021-06-16 LAB — LACTATE DEHYDROGENASE: LDH: 168 U/L (ref 98–192)

## 2021-06-16 MED ORDER — SODIUM CHLORIDE 0.9% FLUSH
10.0000 mL | INTRAVENOUS | Status: DC | PRN
  Administered 2021-06-16: 10 mL

## 2021-06-16 MED ORDER — SODIUM CHLORIDE 0.9 % IV SOLN
1000.0000 mg | Freq: Once | INTRAVENOUS | Status: AC
  Administered 2021-06-16: 1000 mg via INTRAVENOUS
  Filled 2021-06-16: qty 40

## 2021-06-16 MED ORDER — HEPARIN SOD (PORK) LOCK FLUSH 100 UNIT/ML IV SOLN
500.0000 [IU] | Freq: Once | INTRAVENOUS | Status: AC | PRN
  Administered 2021-06-16: 500 [IU]

## 2021-06-16 MED ORDER — ACETAMINOPHEN 325 MG PO TABS
650.0000 mg | ORAL_TABLET | Freq: Once | ORAL | Status: AC
  Administered 2021-06-16: 650 mg via ORAL
  Filled 2021-06-16: qty 2

## 2021-06-16 MED ORDER — SODIUM CHLORIDE 0.9 % IV SOLN
40.0000 mg | Freq: Once | INTRAVENOUS | Status: AC
  Administered 2021-06-16: 40 mg via INTRAVENOUS
  Filled 2021-06-16: qty 4

## 2021-06-16 MED ORDER — SODIUM CHLORIDE 0.9 % IV SOLN
40.0000 mg | Freq: Once | INTRAVENOUS | Status: DC
  Filled 2021-06-16: qty 4

## 2021-06-16 MED ORDER — SODIUM CHLORIDE 0.9 % IV SOLN: Freq: Once | INTRAVENOUS | Status: AC

## 2021-06-16 MED ORDER — DIPHENHYDRAMINE HCL 50 MG/ML IJ SOLN
12.5000 mg | Freq: Once | INTRAMUSCULAR | Status: AC
  Administered 2021-06-16: 12.5 mg via INTRAVENOUS
  Filled 2021-06-16: qty 1

## 2021-06-16 NOTE — Patient Instructions (Signed)
[facility]  Discharge Instructions: ?Thank you for choosing [facility] to provide your oncology and hematology care.  ? ?If you have a lab appointment with the Cancer Center, please go directly to the Cancer Center and check in at the registration area. ? ?Wear comfortable clothing and clothing appropriate for easy access to any Portacath or PICC line.  ? ?We strive to give you quality time with your provider. You may need to reschedule your appointment if you arrive late (15 or more minutes).  Arriving late affects you and other patients whose appointments are after yours.  Also, if you miss three or more appointments without notifying the office, you may be dismissed from the clinic at the provider?s discretion.    ?  ?For prescription refill requests, have your pharmacy contact our office and allow 72 hours for refills to be completed.   ? ?Today you received the following chemotherapy and/or immunotherapy agents Gazyva.    ?  ?To help prevent nausea and vomiting after your treatment, we encourage you to take your nausea medication as directed. ? ?BELOW ARE SYMPTOMS THAT SHOULD BE REPORTED IMMEDIATELY: ?*FEVER GREATER THAN 100.4 F (38 ?C) OR HIGHER ?*CHILLS OR SWEATING ?*NAUSEA AND VOMITING THAT IS NOT CONTROLLED WITH YOUR NAUSEA MEDICATION ?*UNUSUAL SHORTNESS OF BREATH ?*UNUSUAL BRUISING OR BLEEDING ?*URINARY PROBLEMS (pain or burning when urinating, or frequent urination) ?*BOWEL PROBLEMS (unusual diarrhea, constipation, pain near the anus) ?TENDERNESS IN MOUTH AND THROAT WITH OR WITHOUT PRESENCE OF ULCERS (sore throat, sores in mouth, or a toothache) ?UNUSUAL RASH, SWELLING OR PAIN  ?UNUSUAL VAGINAL DISCHARGE OR ITCHING  ? ?Items with * indicate a potential emergency and should be followed up as soon as possible or go to the Emergency Department if any problems should occur. ? ?Please show the CHEMOTHERAPY ALERT CARD or IMMUNOTHERAPY ALERT CARD at check-in to the  Emergency Department and triage nurse. ?Should you have questions after your visit or need to cancel or reschedule your appointment, please contact [facility]  [phone] and follow the prompts.  Office hours are 8:00 a.m. to 4:30 p.m. Monday - Friday. Please note that voicemails left after 4:00 p.m. may not be returned until the following business day.  We are closed weekends and major holidays. You have access to a nurse at all times for urgent questions. Please call the main number to the clinic [phone] and follow the prompts. ? ?For any non-urgent questions, you may also contact your provider using MyChart. We now offer e-Visits for anyone 18 and older to request care online for non-urgent symptoms. For details visit [email or web address]. ?  ?Also download the MyChart app! Go to the app store, search "MyChart", open the app, select [facility], and log in with your MyChart username and password. ? ?Due to Covid, a mask is required upon entering the hospital/clinic. If you do not have a mask, one will be given to you upon arrival. For doctor visits, patients may have 1 support person aged 18 or older with them. For treatment visits, patients cannot have anyone with them due to current Covid guidelines and our immunocompromised population.  ?

## 2021-06-16 NOTE — Patient Instructions (Signed)

## 2021-06-16 NOTE — Progress Notes (Signed)
?Hematology and Oncology Follow Up Visit ? ?James Jennings ?[id number] ?09-Mar-1970 51 y.o. ?06/16/2021 ? ? ?Principle Diagnosis:  ?Follicular B- cell non-Hodgkin's lymphoma - Relapsed ?  ?Past Therapy:             ?Rituxan/bendamustine-s/p cycle 6 - completed on 07/03/2016 ?  ?Current Therapy: ? ?Gazyva  - Cytoxan/ Vincristine/Prednisone  - started 11/07/2019, s/p cycle #4 --  D/c on 03/26/2020 ?G-CHOP -- s/p cycle #6-- started on 04/03/2020 -completed on 07/13/2020 ?Maintenance Gazyva-s/p cycle 4/12 --  start on 09/05/2020 ? ?  ?Interim History:  James Jennings is here today for follow-up.  He did have a good time in [place].  He has been quite busy.  He has had no problems with his diabetes.  His blood sugar today is 107. ? ?Sounds like he may have had a gastroenteritis over the weekend.  It seems to like he got sick on Friday/Saturday morning. ? ?His son got sick on Saturday. ? ?He has had no problems with cough or shortness of breath.  He has had no diarrhea.  He has had no rashes.  He has had no headache. ? ?He is doing quite well with his device for his sleep apnea.  This seems to work very well. ? ?Currently, I would say his performance status is probably ECOG 1.  .    ? ? ?Medications:  ?Allergies as of 06/16/2021   ? ?   Reactions  ? Mango Flavor Swelling, Other (See Comments)  ? LIPS SWELL  ? Rituximab Other (See Comments)  ? Shellfish Allergy Anaphylaxis  ? Lactose Intolerance (gi) Diarrhea  ? ?  ? ?  ?Medication List  ?  ? ?  ? Accurate as of Jun 16, 2021  8:53 AM. If you have any questions, ask your nurse or doctor.  ?  ?  ? ?  ? ?acetaminophen 325 MG tablet ?Commonly known as: TYLENOL ?Take 650 mg by mouth every 6 (six) hours as needed for moderate pain or headache. ?  ?atorvastatin 80 MG tablet ?Commonly known as: LIPITOR ?TAKE 1 TABLET BY MOUTH EVERY DAY ?  ?cetirizine 10 MG tablet ?Commonly known as: ZYRTEC ?Take 10 mg by mouth daily as needed for allergies. ?  ?ibuprofen 200 MG tablet ?Commonly known as: ADVIL ?Take  600 mg by mouth every 8 (eight) hours as needed for mild pain or moderate pain. ?  ?lidocaine-prilocaine cream ?Commonly known as: EMLA ?Apply 1 application topically as needed. Apply to [facility] 1 hour prior to procedure. ?  ?metFORMIN 500 MG tablet ?Commonly known as: GLUCOPHAGE ?TAKE 1 TABLET (500 MG TOTAL) BY MOUTH DAILY. ?  ?multivitamin Tabs tablet ?Take 1 tablet by mouth daily. ?  ?polycarbophil 625 MG tablet ?Commonly known as: FIBERCON ?Take 625 mg by mouth daily. ?  ?Semaglutide (1 MG/DOSE) 4 MG/3ML Sopn ?Inject 1 mg as directed once a week. ?  ? ?  ? ? ?Allergies:  ?Allergies  ?Allergen Reactions  ? Mango Flavor Swelling and Other (See Comments)  ?  LIPS SWELL  ? Rituximab Other (See Comments)  ? Shellfish Allergy Anaphylaxis  ? Lactose Intolerance (Gi) Diarrhea  ? ? ?Past Medical History, Surgical history, Social history, and Family History were reviewed and updated. ? ?Review of Systems: ?Review of Systems  ?Constitutional: Negative.   ?HENT: Negative.    ?Eyes: Negative.   ?Respiratory: Negative.    ?Cardiovascular: Negative.   ?Gastrointestinal: Negative.   ?Genitourinary: Negative.   ?Musculoskeletal: Negative.   ?Skin: Negative.   ?  Neurological: Negative.   ?Endo/Heme/Allergies: Negative.   ?Psychiatric/Behavioral: Negative.    ? ? ?Physical Exam: ? weight is 262 lb 8 oz (119.1 kg). His oral temperature is 98.6 ?F (37 ?C). His blood pressure is 115/73 and his pulse is 87. His respiration is 19 and oxygen saturation is 97%.  ? ?Wt Readings from Last 3 Encounters:  ?06/16/21 262 lb 8 oz (119.1 kg)  ?05/20/21 265 lb (120.2 kg)  ?05/08/21 271 lb (122.9 kg)  ? ? ?Physical Exam ?Vitals reviewed.  ?HENT:  ?   Head: Normocephalic and atraumatic.  ?Eyes:  ?   Pupils: Pupils are equal, round, and reactive to light.  ?Cardiovascular:  ?   Rate and Rhythm: Normal rate and regular rhythm.  ?   Heart sounds: Normal heart sounds.  ?Pulmonary:  ?   Effort: Pulmonary effort is normal.  ?   Breath sounds: Normal breath  sounds.  ?Abdominal:  ?   General: Bowel sounds are normal.  ?   Palpations: Abdomen is soft.  ?Musculoskeletal:     ?   General: No tenderness or deformity. Normal range of motion.  ?   Cervical back: Normal range of motion.  ?Lymphadenopathy:  ?   Cervical: No cervical adenopathy.  ?Skin: ?   General: Skin is warm and dry.  ?   Findings: No erythema or rash.  ?Neurological:  ?   Mental Status: He is alert and oriented to person, place, and time.  ?Psychiatric:     ?   Behavior: Behavior normal.     ?   Thought Content: Thought content normal.     ?   Judgment: Judgment normal.  ? ? ? ?Lab Results  ?Component Value Date  ? WBC 4.5 06/16/2021  ? HGB 12.8 (L) 06/16/2021  ? HCT 37.6 (L) 06/16/2021  ? MCV 90.6 06/16/2021  ? PLT 195 06/16/2021  ? ?No results found for: FERRITIN, IRON, TIBC, UIBC, IRONPCTSAT ?Lab Results  ?Component Value Date  ? RETICCTPCT 1.07 12/06/2015  ? RBC 4.15 (L) 06/16/2021  ? RETICCTABS 48.79 12/06/2015  ? ?No results found for: KPAFRELGTCHN, LAMBDASER, KAPLAMBRATIO ?Lab Results  ?Component Value Date  ? IGGSERUM 644 12/25/2020  ? IGA 137 12/25/2020  ? IGMSERUM 17 (L) 12/25/2020  ? ?Lab Results  ?Component Value Date  ? TOTALPROTELP 6.1 12/25/2020  ? ALBUMINELP 3.7 12/25/2020  ? A1GS 0.2 12/25/2020  ? A2GS 0.7 12/25/2020  ? BETS 1.0 12/25/2020  ? GAMS 0.5 12/25/2020  ? MSPIKE Not Observed 12/25/2020  ? ?  Chemistry   ?   ?Component Value Date/Time  ? NA 138 06/16/2021 0815  ? NA 139 12/18/2020 0817  ? NA 141 12/03/2016 1155  ? NA 141 02/07/2016 1149  ? K 3.7 06/16/2021 0815  ? K 3.8 12/03/2016 1155  ? K 4.3 02/07/2016 1149  ? CL 104 06/16/2021 0815  ? CL 100 12/03/2016 1155  ? CO2 27 06/16/2021 0815  ? CO2 27 12/03/2016 1155  ? CO2 27 02/07/2016 1149  ? BUN 12 06/16/2021 0815  ? BUN 16 12/18/2020 0817  ? BUN 10 12/03/2016 1155  ? BUN 10.9 02/07/2016 1149  ? CREATININE 1.10 06/16/2021 0815  ? CREATININE 1.0 12/03/2016 1155  ? CREATININE 0.9 02/07/2016 1149  ?    ?Component Value Date/Time  ?  CALCIUM 9.2 06/16/2021 0815  ? CALCIUM 9.1 12/03/2016 1155  ? CALCIUM 9.6 02/07/2016 1149  ? ALKPHOS 121 06/16/2021 0815  ? ALKPHOS 99 (H) 12/03/2016 1155  ?  ALKPHOS 127 02/07/2016 1149  ? AST 23 06/16/2021 0815  ? AST 17 02/07/2016 1149  ? ALT 29 06/16/2021 0815  ? ALT 24 12/03/2016 1155  ? ALT 16 02/07/2016 1149  ? BILITOT 0.6 06/16/2021 0815  ? BILITOT 0.42 02/07/2016 1149  ?  ? ? ? ?Impression and Plan: Mr. Cullen is a pleasant 51 yo caucasian gentleman with history of follicular large cell non-Hodgkin's lymphoma. He completed 6 cycles of chemotherapy with bendamustine on 07/03/2016 but was not tolerant of Rituxan (anaphylaxis). His lymphoma has  recurred.  We then treated him with CHOP.  This got him into remission. ? ?He is on maintenance Gazyva now.  This was his fifth cycle.  He is doing well right now.  I do not see any evidence that there is any kind of progression. ? ?I do think that it be worthwhile having to get a PET scan on him.  Again, given his history, he does tend to have problems with recurrence that we just cannot pick up clinically.  I will see about getting a PET scan on him before we see him back in 2 months. ? ? ? ?[name], MD ?5/15/20238:53 AM ?

## 2021-06-17 LAB — BETA 2 MICROGLOBULIN, SERUM: Beta-2 Microglobulin: 1.9 mg/L (ref 0.6–2.4)

## 2021-06-19 ENCOUNTER — Encounter (INDEPENDENT_AMBULATORY_CARE_PROVIDER_SITE_OTHER): Payer: Self-pay | Admitting: Family Medicine

## 2021-06-19 ENCOUNTER — Ambulatory Visit (INDEPENDENT_AMBULATORY_CARE_PROVIDER_SITE_OTHER): Payer: BC Managed Care – PPO | Admitting: Family Medicine

## 2021-06-19 VITALS — BP 129/77 | HR 82 | Temp 98.2°F | Ht 71.0 in | Wt 267.0 lb

## 2021-06-19 DIAGNOSIS — Z7985 Long-term (current) use of injectable non-insulin antidiabetic drugs: Secondary | ICD-10-CM

## 2021-06-19 DIAGNOSIS — E1169 Type 2 diabetes mellitus with other specified complication: Secondary | ICD-10-CM

## 2021-06-19 DIAGNOSIS — Z6837 Body mass index (BMI) 37.0-37.9, adult: Secondary | ICD-10-CM

## 2021-06-19 DIAGNOSIS — E785 Hyperlipidemia, unspecified: Secondary | ICD-10-CM | POA: Diagnosis not present

## 2021-06-19 DIAGNOSIS — E1165 Type 2 diabetes mellitus with hyperglycemia: Secondary | ICD-10-CM

## 2021-06-19 DIAGNOSIS — E669 Obesity, unspecified: Secondary | ICD-10-CM | POA: Diagnosis not present

## 2021-06-19 MED ORDER — METFORMIN HCL 500 MG PO TABS: 500.0000 mg | ORAL_TABLET | Freq: Every day | ORAL | 0 refills | Status: DC

## 2021-06-19 MED ORDER — ATORVASTATIN CALCIUM 80 MG PO TABS: 80.0000 mg | ORAL_TABLET | Freq: Every day | ORAL | 0 refills | Status: DC

## 2021-06-19 MED ORDER — SEMAGLUTIDE (1 MG/DOSE) 4 MG/3ML ~~LOC~~ SOPN: 1.0000 mg | PEN_INJECTOR | SUBCUTANEOUS | 0 refills | Status: DC

## 2021-06-25 DIAGNOSIS — G4733 Obstructive sleep apnea (adult) (pediatric): Secondary | ICD-10-CM | POA: Diagnosis not present

## 2021-06-26 NOTE — Progress Notes (Signed)
Chief Complaint:   OBESITY James Jennings is here to discuss his progress with his obesity treatment plan along with follow-up of his obesity related diagnoses. James Jennings is on keeping a food journal and adhering to recommended goals of 2000-2200 calories and 140 grams of protein and states he is following his eating plan approximately (he did not say) % of the time. James Jennings states he is walking 30-45 minutes 3-4 times per week.  Today's visit was #: 26 Starting weight: 277 lbs Starting date: 03/14/2020 Today's weight: 267 lbs Today's date: 06/19/2021 Total lbs lost to date: 10 Total lbs lost since last in-office visit: 0  Interim History: James Jennings has been eating more at home over the last few weeks. He journaled for a few days and then stopped, ending with 2100 cal those days. James Jennings eats more protein then he did previously. For the next few weeks he has mostly work and regular life.  Subjective:   1. Type 2 diabetes mellitus with hyperglycemia, without long-term current use of insulin ([name]) Kjuan is currently on Ozempic with good blood sugar control. He is on Glucophage as well.  2. Hyperlipidemia associated with type 2 diabetes mellitus ([place]) James Jennings currently is taking Lipitor without myalgias. His last LDL at 110, HDL at 42 and Trig 101.  Assessment/Plan:   1. Type 2 diabetes mellitus with hyperglycemia, without long-term current use of insulin ([name]) We will refill Ozempic 1 mg subcutaneous once weekly for 1 month with no refills. Also refill Metformin 500 mg by mouth daily for 1 month with no refills.  -Refill Semaglutide, 1 MG/DOSE, 4 MG/3ML SOPN; Inject 1 mg as directed once a week.  Dispense: 3 mL; Refill: 0  -Refill metFORMIN (GLUCOPHAGE) 500 MG tablet; Take 1 tablet (500 mg total) by mouth daily.  Dispense: 90 tablet; Refill: 0  2. Hyperlipidemia associated with type 2 diabetes mellitus ([name]) We will refill Atorvastatin 80 mg by mouth daily for 1 month with no refills.  -Refill atorvastatin  (LIPITOR) 80 MG tablet; Take 1 tablet (80 mg total) by mouth daily.  Dispense: 90 tablet; Refill: 0  3. Obesity with current BMI of 37.2 James Jennings is currently in the action stage of change. As such, his goal is to continue with weight loss efforts. He has agreed to keeping a food journal and adhering to recommended goals of 2000-2200 calories and 140+ grams of protein daily.   Exercise goals: All adults should avoid inactivity. Some physical activity is better than none, and adults who participate in any amount of physical activity gain some health benefits.  Behavioral modification strategies: increasing lean protein intake, meal planning and cooking strategies, keeping healthy foods in the home, and planning for success.  James Jennings has agreed to follow-up with our clinic in 4 weeks. He was informed of the importance of frequent follow-up visits to maximize his success with intensive lifestyle modifications for his multiple health conditions.   Objective:   Blood pressure 129/77, pulse 82, temperature 98.2 F (36.8 C), height '5\' 11"'$  (1.803 m), weight 267 lb (121.1 kg), SpO2 97 %. Body mass index is 37.24 kg/m.  General: Cooperative, alert, well developed, in no acute distress. HEENT: Conjunctivae and lids unremarkable. Cardiovascular: Regular rhythm.  Lungs: Normal work of breathing. Neurologic: No focal deficits.   Lab Results  Component Value Date   CREATININE 1.10 06/16/2021   BUN 12 06/16/2021   NA 138 06/16/2021   K 3.7 06/16/2021   CL 104 06/16/2021   CO2 27 06/16/2021  Lab Results  Component Value Date   ALT 29 06/16/2021   AST 23 06/16/2021   ALKPHOS 121 06/16/2021   BILITOT 0.6 06/16/2021   Lab Results  Component Value Date   HGBA1C 6.4 (H) 12/18/2020   HGBA1C 7.1 (H) 06/25/2020   HGBA1C 8.2 (H) 03/14/2020   HGBA1C 6.2 (H) 08/10/2018   Lab Results  Component Value Date   INSULIN 22.6 12/18/2020   INSULIN 35.2 (H) 06/25/2020   INSULIN 28.0 (H) 03/14/2020   INSULIN  24.6 08/10/2018   Lab Results  Component Value Date   TSH 2.603 07/16/2020   Lab Results  Component Value Date   CHOL 171 12/18/2020   HDL 42 12/18/2020   LDLCALC 110 (H) 12/18/2020   TRIG 101 12/18/2020   CHOLHDL 4.5 06/25/2020   Lab Results  Component Value Date   VD25OH 50.5 12/18/2020   VD25OH 42.9 06/25/2020   VD25OH 41.3 03/14/2020   Lab Results  Component Value Date   WBC 4.5 06/16/2021   HGB 12.8 (L) 06/16/2021   HCT 37.6 (L) 06/16/2021   MCV 90.6 06/16/2021   PLT 195 06/16/2021   No results found for: IRON, TIBC, FERRITIN  Attestation Statements:   Reviewed by clinician on day of visit: allergies, medications, problem list, medical history, surgical history, family history, social history, and previous encounter notes.  I, Elnora Morrison, RMA am acting as transcriptionist for Coralie Common, MD.  I have reviewed the above documentation for accuracy and completeness, and I agree with the above. - Coralie Common, MD

## 2021-07-16 ENCOUNTER — Other Ambulatory Visit (INDEPENDENT_AMBULATORY_CARE_PROVIDER_SITE_OTHER): Payer: Self-pay | Admitting: Family Medicine

## 2021-07-16 ENCOUNTER — Encounter (INDEPENDENT_AMBULATORY_CARE_PROVIDER_SITE_OTHER): Payer: Self-pay | Admitting: Family Medicine

## 2021-07-16 DIAGNOSIS — E1165 Type 2 diabetes mellitus with hyperglycemia: Secondary | ICD-10-CM

## 2021-07-16 NOTE — Telephone Encounter (Signed)
LAST APPOINTMENT DATE: 06/19/21 NEXT APPOINTMENT DATE: 08/19/21   [id number] [facility] [address]Phone: [phone]: [phone]Phone: [phone]: [phone] [id number] [address] [place]: [phone]: [phone] is requesting a refill of the following medications: Requested Prescriptions   Pending Prescriptions Disp Refills   Semaglutide, 1 MG/DOSE, 4 MG/3ML SOPN 3 mL 0    Sig: Inject 1 mg as directed once a week.    Date last filled: 06/19/2021 Previously prescribed by Dr UkJearld ShinesLab Results  Component Value Date   HGBA1C 6.4 (H) 12/18/2020   HGBA1C 7.1 (H) 06/25/2020   HGBA1C 8.2 (H) 03/14/2020   Lab Results  Component Value Date   LDLCALC 110 (H) 12/18/2020   CREATININE 1.10 06/16/2021   Lab Results  Component Value Date   VD25OH 50.5 12/18/2020   VD25OH 42.9 06/25/2020   VD25OH 41.3 03/14/2020    BP Readings from Last 3 Encounters:  06/19/21 129/77  06/16/21 115/73  05/20/21 106/64

## 2021-07-21 ENCOUNTER — Other Ambulatory Visit: Payer: Self-pay | Admitting: Pharmacist

## 2021-07-21 ENCOUNTER — Other Ambulatory Visit (INDEPENDENT_AMBULATORY_CARE_PROVIDER_SITE_OTHER): Payer: Self-pay | Admitting: Family Medicine

## 2021-07-21 DIAGNOSIS — E1165 Type 2 diabetes mellitus with hyperglycemia: Secondary | ICD-10-CM

## 2021-07-21 NOTE — Telephone Encounter (Signed)
LAST APPOINTMENT DATE: 06/19/21 NEXT APPOINTMENT DATE: 08/19/21   [id number] - [address]Phone: [phone]: [phone] [address]Phone: [phone]: [phone] [id number] [address]one: [phone]: [phone] is requesting a refill of the following medications: Pending Prescriptions:                       Disp   Refills   Semaglutide, 1 MG/DOSE, 4 MG/3ML SOPN      3 mL   0       Sig: Inject 1 mg as directed once a week.   Date last filled: 06/19/21 Previously prescribed by Dr.Ukleja  Lab Results      Component                Value               Date                      HGBA1C                   6.4 (H)             12/18/2020                HGBA1C                   7.1 (H)             06/25/2020                HGBA1C                   8.2 (H)             03/14/2020           Lab Results      Component                Value               Date                      LDLCALC                  110 (H)             12/18/2020                CREATININE               1.10                06/16/2021           Lab Results      Component                Value               Date                      VD25OH                   50.5  12/18/2020                VD25OH                   42.9                06/25/2020                VD25OH                   41.3                03/14/2020            BP Readings from Last 3 Encounters: 06/19/21 : 129/77 06/16/21 : 115/73 05/20/21 : 106/64

## 2021-07-24 ENCOUNTER — Other Ambulatory Visit (INDEPENDENT_AMBULATORY_CARE_PROVIDER_SITE_OTHER): Payer: Self-pay | Admitting: Family Medicine

## 2021-07-24 DIAGNOSIS — E1165 Type 2 diabetes mellitus with hyperglycemia: Secondary | ICD-10-CM

## 2021-07-24 NOTE — Telephone Encounter (Signed)
LAST APPOINTMENT DATE: 06/19/21 NEXT APPOINTMENT DATE: 08/19/21   [id number] - [address]Phone: [phone]: [phone] [address] Lake[address]Phone: [phone]: [phone] [id number] [address] [place]hone: [phone]: [phone] is requesting a refill of the following medications: Pending Prescriptions:                       Disp   Refills   Semaglutide, 1 MG/DOSE, 4 MG/3ML SOPN      3 mL   0       Sig: Inject 1 mg as directed once a week.   Date last filled: 06/19/21 Previously prescribed by Dr.Ukleja  Lab Results      Component                Value               Date                      HGBA1C                   6.4 (H)             12/18/2020                HGBA1C                   7.1 (H)             06/25/2020                HGBA1C                   8.2 (H)             03/14/2020           Lab Results      Component                Value               Date                      LDLCALC                  110 (H)             12/18/2020                CREATININE               1.10                06/16/2021           Lab Results      Component                Value               Date                      VD25OH                   50.5  12/18/2020                VD25OH                   42.9                06/25/2020                VD25OH                   41.3                03/14/2020            BP Readings from Last 3 Encounters: 06/19/21 : 129/77 06/16/21 : 115/73 05/20/21 : 106/64

## 2021-07-28 DIAGNOSIS — G4733 Obstructive sleep apnea (adult) (pediatric): Secondary | ICD-10-CM | POA: Diagnosis not present

## 2021-08-06 ENCOUNTER — Encounter (HOSPITAL_COMMUNITY): Payer: BC Managed Care – PPO

## 2021-08-13 DIAGNOSIS — G4733 Obstructive sleep apnea (adult) (pediatric): Secondary | ICD-10-CM | POA: Diagnosis not present

## 2021-08-14 ENCOUNTER — Encounter (HOSPITAL_COMMUNITY)
Admission: RE | Admit: 2021-08-14 | Discharge: 2021-08-14 | Disposition: A | Discharge status: Home / Self Care | Payer: BC Managed Care – PPO | Source: Ambulatory Visit | Attending: Hematology & Oncology | Admitting: Hematology & Oncology

## 2021-08-14 DIAGNOSIS — C8223 Follicular lymphoma grade III, unspecified, intra-abdominal lymph nodes: Secondary | ICD-10-CM | POA: Insufficient documentation

## 2021-08-14 DIAGNOSIS — C8290 Follicular lymphoma, unspecified, unspecified site: Secondary | ICD-10-CM | POA: Diagnosis not present

## 2021-08-14 LAB — GLUCOSE, CAPILLARY: Glucose-Capillary: 121 mg/dL — ABNORMAL HIGH (ref 70–99)

## 2021-08-14 MED ORDER — FLUDEOXYGLUCOSE F - 18 (FDG) INJECTION
13.0000 | Freq: Once | INTRAVENOUS | Status: AC | PRN
Start: 2021-08-14 — End: 2021-08-14
  Administered 2021-08-14: 13.3 via INTRAVENOUS

## 2021-08-15 ENCOUNTER — Telehealth: Payer: Self-pay

## 2021-08-15 NOTE — Telephone Encounter (Signed)
Advised via MyChart.

## 2021-08-15 NOTE — Telephone Encounter (Signed)
-----   Message from [name], MD sent at 08/15/2021 12:59 PM EDT ----- Call - the PET scan looks great!!  No obvious lymphoma.  James Jennings

## 2021-08-18 ENCOUNTER — Other Ambulatory Visit: Payer: Self-pay

## 2021-08-18 ENCOUNTER — Inpatient Hospital Stay: Payer: BC Managed Care – PPO

## 2021-08-18 ENCOUNTER — Inpatient Hospital Stay (HOSPITAL_BASED_OUTPATIENT_CLINIC_OR_DEPARTMENT_OTHER): Payer: BC Managed Care – PPO | Admitting: Hematology & Oncology

## 2021-08-18 ENCOUNTER — Encounter: Payer: Self-pay | Admitting: Hematology & Oncology

## 2021-08-18 ENCOUNTER — Inpatient Hospital Stay: Payer: BC Managed Care – PPO | Attending: Hematology & Oncology

## 2021-08-18 VITALS — BP 109/76 | HR 86 | Temp 98.3°F | Resp 18 | Wt 274.0 lb

## 2021-08-18 DIAGNOSIS — C8223 Follicular lymphoma grade III, unspecified, intra-abdominal lymph nodes: Secondary | ICD-10-CM | POA: Insufficient documentation

## 2021-08-18 DIAGNOSIS — Z5112 Encounter for antineoplastic immunotherapy: Secondary | ICD-10-CM | POA: Diagnosis not present

## 2021-08-18 LAB — CBC WITH DIFFERENTIAL (CANCER CENTER ONLY)
Abs Immature Granulocytes: 0.01 10*3/uL (ref 0.00–0.07)
Basophils Absolute: 0 10*3/uL (ref 0.0–0.1)
Basophils Relative: 0 %
Eosinophils Absolute: 0.1 10*3/uL (ref 0.0–0.5)
Eosinophils Relative: 3 %
HCT: 38 % — ABNORMAL LOW (ref 39.0–52.0)
Hemoglobin: 13.1 g/dL (ref 13.0–17.0)
Immature Granulocytes: 0 %
Lymphocytes Relative: 16 %
Lymphs Abs: 0.8 10*3/uL (ref 0.7–4.0)
MCH: 32 pg (ref 26.0–34.0)
MCHC: 34.5 g/dL (ref 30.0–36.0)
MCV: 92.7 fL (ref 80.0–100.0)
Monocytes Absolute: 0.6 10*3/uL (ref 0.1–1.0)
Monocytes Relative: 12 %
Neutro Abs: 3.3 10*3/uL (ref 1.7–7.7)
Neutrophils Relative %: 69 %
Platelet Count: 222 10*3/uL (ref 150–400)
RBC: 4.1 MIL/uL — ABNORMAL LOW (ref 4.22–5.81)
RDW: 12.6 % (ref 11.5–15.5)
Smear Review: NORMAL
WBC Count: 4.8 10*3/uL (ref 4.0–10.5)
nRBC: 0 % (ref 0.0–0.2)

## 2021-08-18 LAB — CMP (CANCER CENTER ONLY)
ALT: 17 U/L (ref 0–44)
AST: 15 U/L (ref 15–41)
Albumin: 4.3 g/dL (ref 3.5–5.0)
Alkaline Phosphatase: 120 U/L (ref 38–126)
Anion gap: 6 (ref 5–15)
BUN: 18 mg/dL (ref 6–20)
CO2: 28 mmol/L (ref 22–32)
Calcium: 9.1 mg/dL (ref 8.9–10.3)
Chloride: 104 mmol/L (ref 98–111)
Creatinine: 0.98 mg/dL (ref 0.61–1.24)
GFR, Estimated: >60 mL/min (ref >60)
Glucose, Bld: 133 mg/dL — ABNORMAL HIGH (ref 70–99)
Potassium: 4.3 mmol/L (ref 3.5–5.1)
Sodium: 138 mmol/L (ref 135–145)
Total Bilirubin: 0.5 mg/dL (ref 0.3–1.2)
Total Protein: 6.4 g/dL — ABNORMAL LOW (ref 6.5–8.1)

## 2021-08-18 LAB — LACTATE DEHYDROGENASE: LDH: 147 U/L (ref 98–192)

## 2021-08-18 MED ORDER — SODIUM CHLORIDE 0.9 % IV SOLN
1000.0000 mg | Freq: Once | INTRAVENOUS | Status: AC
  Administered 2021-08-18: 1000 mg via INTRAVENOUS
  Filled 2021-08-18: qty 40

## 2021-08-18 MED ORDER — DIPHENHYDRAMINE HCL 50 MG/ML IJ SOLN
12.5000 mg | Freq: Once | INTRAMUSCULAR | Status: AC
  Administered 2021-08-18: 12.5 mg via INTRAVENOUS
  Filled 2021-08-18: qty 1

## 2021-08-18 MED ORDER — SODIUM CHLORIDE 0.9 % IV SOLN
40.0000 mg | Freq: Once | INTRAVENOUS | Status: AC
  Administered 2021-08-18: 40 mg via INTRAVENOUS
  Filled 2021-08-18: qty 4

## 2021-08-18 MED ORDER — SODIUM CHLORIDE 0.9 % IV SOLN: Freq: Once | INTRAVENOUS | Status: AC

## 2021-08-18 MED ORDER — SODIUM CHLORIDE 0.9% FLUSH
10.0000 mL | INTRAVENOUS | Status: DC | PRN
  Administered 2021-08-18: 10 mL

## 2021-08-18 MED ORDER — HEPARIN SOD (PORK) LOCK FLUSH 100 UNIT/ML IV SOLN
500.0000 [IU] | Freq: Once | INTRAVENOUS | Status: AC | PRN
  Administered 2021-08-18: 500 [IU]

## 2021-08-18 MED ORDER — ACETAMINOPHEN 325 MG PO TABS
650.0000 mg | ORAL_TABLET | Freq: Once | ORAL | Status: AC
  Administered 2021-08-18: 650 mg via ORAL
  Filled 2021-08-18: qty 2

## 2021-08-18 NOTE — Progress Notes (Signed)
Hematology and Oncology Follow Up Visit  James Jennings [id number] [birth year] 51 y.o. 08/18/2021   Principle Diagnosis:  Follicular B- cell non-Hodgkin's lymphoma - Relapsed   Past Therapy:             Rituxan/bendamustine-s/p cycle 6 - completed on 07/03/2016   Current Therapy:  Gazyva  - Cytoxan/ Vincristine/Prednisone  - started 11/07/2019, s/p cycle #4 --  D/c on 03/26/2020 G-CHOP -- s/p cycle #6-- started on 04/03/2020 -completed on 07/13/2020 Maintenance Gazyva-s/p cycle 5/12 --  start on 09/05/2020    Interim History:  James Jennings is here today for follow-up.  He had a very nice weekend.  It was his anniversary.  He and his wife went to a local hotel for the week and had a wonderful time of doing nothing.  He really enjoyed this.  He says he is going back to work 2 days a week.  This will certainly help with his weight.  He did have a PET scan done.  This was done on 08/14/2021.  The PET scan did not show any evidence of recurrent lymphoma.  He has had no problems with cough or shortness of breath.  He has had no nausea or vomiting.  He has had no rashes.  Is been no leg swelling.  His blood sugars have been doing pretty well.  He says they have been more often below 100.  He has had no fever.  He has had no bleeding.  He has had no obvious change in bowel or bladder habits.  James Jennings does have sleep apnea.  He does have a CPAP machine.  Overall, I would say his performance status is probably ECOG 1.  .      Medications:  Allergies as of 08/18/2021       Reactions   Mango Flavor Swelling, Other (See Comments)   LIPS SWELL   Rituximab Other (See Comments)   Shellfish Allergy Anaphylaxis   Lactose Intolerance (gi) Diarrhea        Medication List        Accurate as of August 18, 2021  8:18 AM. If you have any questions, ask your nurse or doctor.          acetaminophen 325 MG tablet Commonly known as: TYLENOL Take 650 mg by mouth every 6 (six) hours as needed for moderate  pain or headache.   atorvastatin 80 MG tablet Commonly known as: LIPITOR Take 1 tablet (80 mg total) by mouth daily.   cetirizine 10 MG tablet Commonly known as: ZYRTEC Take 10 mg by mouth daily as needed for allergies.   ibuprofen 200 MG tablet Commonly known as: ADVIL Take 600 mg by mouth every 8 (eight) hours as needed for mild pain or moderate pain.   lidocaine-prilocaine cream Commonly known as: EMLA Apply 1 application topically as needed. Apply to [facility] 1 hour prior to procedure.   metFORMIN 500 MG tablet Commonly known as: GLUCOPHAGE Take 1 tablet (500 mg total) by mouth daily.   multivitamin Tabs tablet Take 1 tablet by mouth daily.   polycarbophil 625 MG tablet Commonly known as: FIBERCON Take 625 mg by mouth daily.   Semaglutide (1 MG/DOSE) 4 MG/3ML Sopn Inject 1 mg as directed once a week.        Allergies:  Allergies  Allergen Reactions   Mango Flavor Swelling and Other (See Comments)    LIPS SWELL   Rituximab Other (See Comments)   Shellfish Allergy Anaphylaxis   Lactose Intolerance (Gi)  Diarrhea    Past Medical History, Surgical history, Social history, and Family History were reviewed and updated.  Review of Systems: Review of Systems  Constitutional: Negative.   HENT: Negative.    Eyes: Negative.   Respiratory: Negative.    Cardiovascular: Negative.   Gastrointestinal: Negative.   Genitourinary: Negative.   Musculoskeletal: Negative.   Skin: Negative.   Neurological: Negative.   Endo/Heme/Allergies: Negative.   Psychiatric/Behavioral: Negative.       Physical Exam:  vitals were not taken for this visit.   Wt Readings from Last 3 Encounters:  06/19/21 267 lb (121.1 kg)  06/16/21 262 lb 8 oz (119.1 kg)  05/20/21 265 lb (120.2 kg)  His vital signs are temperature of 98.3.  Pulse 86.  Blood pressure 109/76.  Weight is 274 pounds.  Physical Exam Vitals reviewed.  HENT:     Head: Normocephalic and atraumatic.  Eyes:     Pupils:  Pupils are equal, round, and reactive to light.  Cardiovascular:     Rate and Rhythm: Normal rate and regular rhythm.     Heart sounds: Normal heart sounds.  Pulmonary:     Effort: Pulmonary effort is normal.     Breath sounds: Normal breath sounds.  Abdominal:     General: Bowel sounds are normal.     Palpations: Abdomen is soft.  Musculoskeletal:        General: No tenderness or deformity. Normal range of motion.     Cervical back: Normal range of motion.  Lymphadenopathy:     Cervical: No cervical adenopathy.  Skin:    General: Skin is warm and dry.     Findings: No erythema or rash.  Neurological:     Mental Status: He is alert and oriented to person, place, and time.  Psychiatric:        Behavior: Behavior normal.        Thought Content: Thought content normal.        Judgment: Judgment normal.      Lab Results  Component Value Date   WBC 4.5 06/16/2021   HGB 12.8 (L) 06/16/2021   HCT 37.6 (L) 06/16/2021   MCV 90.6 06/16/2021   PLT 195 06/16/2021   No results found for: "FERRITIN", "IRON", "TIBC", "UIBC", "IRONPCTSAT" Lab Results  Component Value Date   RETICCTPCT 1.07 12/06/2015   RBC 4.15 (L) 06/16/2021   RETICCTABS 48.79 12/06/2015   No results found for: "KPAFRELGTCHN", "LAMBDASER", "[facility]" Lab Results  Component Value Date   IGGSERUM 644 12/25/2020   IGA 137 12/25/2020   IGMSERUM 17 (L) 12/25/2020   Lab Results  Component Value Date   TOTALPROTELP 6.1 12/25/2020   ALBUMINELP 3.7 12/25/2020   A1GS 0.2 12/25/2020   A2GS 0.7 12/25/2020   BETS 1.0 12/25/2020   GAMS 0.5 12/25/2020   MSPIKE Not Observed 12/25/2020     Chemistry      Component Value Date/Time   NA 138 06/16/2021 0815   NA 139 12/18/2020 0817   NA 141 12/03/2016 1155   NA 141 02/07/2016 1149   K 3.7 06/16/2021 0815   K 3.8 12/03/2016 1155   K 4.3 02/07/2016 1149   CL 104 06/16/2021 0815   CL 100 12/03/2016 1155   CO2 27 06/16/2021 0815   CO2 27 12/03/2016 1155   CO2  27 02/07/2016 1149   BUN 12 06/16/2021 0815   BUN 16 12/18/2020 0817   BUN 10 12/03/2016 1155   BUN 10.9 02/07/2016 1149   CREATININE  1.10 06/16/2021 0815   CREATININE 1.0 12/03/2016 1155   CREATININE 0.9 02/07/2016 1149      Component Value Date/Time   CALCIUM 9.2 06/16/2021 0815   CALCIUM 9.1 12/03/2016 1155   CALCIUM 9.6 02/07/2016 1149   ALKPHOS 121 06/16/2021 0815   ALKPHOS 99 (H) 12/03/2016 1155   ALKPHOS 127 02/07/2016 1149   AST 23 06/16/2021 0815   AST 17 02/07/2016 1149   ALT 29 06/16/2021 0815   ALT 24 12/03/2016 1155   ALT 16 02/07/2016 1149   BILITOT 0.6 06/16/2021 0815   BILITOT 0.42 02/07/2016 1149       Impression and Plan: James Jennings is a pleasant 51 yo caucasian gentleman with history of follicular large cell non-Hodgkin's lymphoma. He completed 6 cycles of chemotherapy with bendamustine on 07/03/2016 but was not tolerant of Rituxan (anaphylaxis). His lymphoma has  recurred.  We then treated him with CHOP.  This got him into remission.  He is on maintenance Gazyva now.  We will go ahead with his sixth cycle of treatment.  He will have a total of 12 cycles.  We are going every 2 months.  I do not think we need another PET scan probably until the end of the year.  I will see him back in 2 months.    [name], MD 7/17/20238:18 AM

## 2021-08-18 NOTE — Patient Instructions (Signed)
[facility] [place]  Discharge Instructions: Thank you for choosing [facility] to provide your oncology and hematology care.   If you have a lab appointment with the [facility], please go directly to the [facility] and check in at the registration area.  Wear comfortable clothing and clothing appropriate for easy access to any Portacath or PICC line.   We strive to give you quality time with your provider. You may need to reschedule your appointment if you arrive late (15 or more minutes).  Arriving late affects you and other patients whose appointments are after yours.  Also, if you miss three or more appointments without notifying the office, you may be dismissed from the clinic at the provider's discretion.      For prescription refill requests, have your pharmacy contact our office and allow 72 hours for refills to be completed.    Today you received the following chemotherapy and/or immunotherapy agents:  Gazyva      To help prevent nausea and vomiting after your treatment, we encourage you to take your nausea medication as directed.  BELOW ARE SYMPTOMS THAT SHOULD BE REPORTED IMMEDIATELY: *FEVER GREATER THAN 100.4 F (38 C) OR HIGHER *CHILLS OR SWEATING *NAUSEA AND VOMITING THAT IS NOT CONTROLLED WITH YOUR NAUSEA MEDICATION *UNUSUAL SHORTNESS OF BREATH *UNUSUAL BRUISING OR BLEEDING *URINARY PROBLEMS (pain or burning when urinating, or frequent urination) *BOWEL PROBLEMS (unusual diarrhea, constipation, pain near the anus) TENDERNESS IN MOUTH AND THROAT WITH OR WITHOUT PRESENCE OF ULCERS (sore throat, sores in mouth, or a toothache) UNUSUAL RASH, SWELLING OR PAIN  UNUSUAL VAGINAL DISCHARGE OR ITCHING   Items with * indicate a potential emergency and should be followed up as soon as possible or go to the Emergency Department if any problems should occur.  Please show the CHEMOTHERAPY ALERT CARD or IMMUNOTHERAPY ALERT CARD at check-in to the  Emergency Department and triage nurse. Should you have questions after your visit or need to cancel or reschedule your appointment, please contact [facility]  [phone] and follow the prompts.  Office hours are 8:00 a.m. to 4:30 p.m. Monday - Friday. Please note that voicemails left after 4:00 p.m. may not be returned until the following business day.  We are closed weekends and major holidays. You have access to a nurse at all times for urgent questions. Please call the main number to the clinic [phone] and follow the prompts.  For any non-urgent questions, you may also contact your provider using MyChart. We now offer e-Visits for anyone 49 and older to request care online for non-urgent symptoms. For details visit [email or web address].   Also download the MyChart app! Go to the app store, search "MyChart", open the app, select [facility], and log in with your MyChart username and password.  Masks are optional in the cancer centers. If you would like for your care team to wear a mask while they are taking care of you, please let them know. For doctor visits, patients may have with them one support person who is at least 54 years old. At this time, visitors are not allowed in the infusion area.

## 2021-08-18 NOTE — Addendum Note (Signed)
Addended by: Burney Gauze R on: 08/18/2021 08:54 AM   Modules accepted: Orders

## 2021-08-19 ENCOUNTER — Ambulatory Visit (INDEPENDENT_AMBULATORY_CARE_PROVIDER_SITE_OTHER): Payer: BC Managed Care – PPO | Admitting: Family Medicine

## 2021-08-25 ENCOUNTER — Other Ambulatory Visit: Payer: Self-pay

## 2021-09-02 ENCOUNTER — Other Ambulatory Visit: Payer: Self-pay

## 2021-09-10 ENCOUNTER — Encounter (INDEPENDENT_AMBULATORY_CARE_PROVIDER_SITE_OTHER): Payer: Self-pay

## 2021-09-20 ENCOUNTER — Other Ambulatory Visit: Payer: Self-pay | Admitting: Hematology & Oncology

## 2021-09-22 ENCOUNTER — Encounter: Payer: Self-pay | Admitting: Hematology & Oncology

## 2021-10-01 MED ORDER — SEMAGLUTIDE (1 MG/DOSE) 4 MG/3ML ~~LOC~~ SOPN: 1.0000 mg | PEN_INJECTOR | SUBCUTANEOUS | 0 refills | Status: DC

## 2021-10-03 ENCOUNTER — Other Ambulatory Visit: Payer: Self-pay

## 2021-10-10 ENCOUNTER — Other Ambulatory Visit: Payer: Self-pay

## 2021-10-15 ENCOUNTER — Other Ambulatory Visit: Payer: Self-pay

## 2021-10-20 ENCOUNTER — Encounter: Payer: Self-pay | Admitting: Hematology & Oncology

## 2021-10-20 ENCOUNTER — Other Ambulatory Visit: Payer: Self-pay

## 2021-10-20 ENCOUNTER — Inpatient Hospital Stay: Payer: BC Managed Care – PPO

## 2021-10-20 ENCOUNTER — Inpatient Hospital Stay: Payer: BC Managed Care – PPO | Attending: Hematology & Oncology

## 2021-10-20 ENCOUNTER — Inpatient Hospital Stay (HOSPITAL_BASED_OUTPATIENT_CLINIC_OR_DEPARTMENT_OTHER): Payer: BC Managed Care – PPO | Admitting: Hematology & Oncology

## 2021-10-20 VITALS — BP 120/81 | HR 92 | Temp 97.7°F | Resp 18 | Ht 71.46 in | Wt 274.0 lb

## 2021-10-20 VITALS — Ht 71.46 in | Wt 274.0 lb

## 2021-10-20 DIAGNOSIS — C8223 Follicular lymphoma grade III, unspecified, intra-abdominal lymph nodes: Secondary | ICD-10-CM

## 2021-10-20 DIAGNOSIS — Z5112 Encounter for antineoplastic immunotherapy: Secondary | ICD-10-CM | POA: Insufficient documentation

## 2021-10-20 LAB — CBC WITH DIFFERENTIAL (CANCER CENTER ONLY)
Abs Immature Granulocytes: 0.01 10*3/uL (ref 0.00–0.07)
Basophils Absolute: 0 10*3/uL (ref 0.0–0.1)
Basophils Relative: 0 %
Eosinophils Absolute: 0.2 10*3/uL (ref 0.0–0.5)
Eosinophils Relative: 4 %
HCT: 38.3 % — ABNORMAL LOW (ref 39.0–52.0)
Hemoglobin: 13.1 g/dL (ref 13.0–17.0)
Immature Granulocytes: 0 %
Lymphocytes Relative: 17 %
Lymphs Abs: 0.8 10*3/uL (ref 0.7–4.0)
MCH: 31.6 pg (ref 26.0–34.0)
MCHC: 34.2 g/dL (ref 30.0–36.0)
MCV: 92.3 fL (ref 80.0–100.0)
Monocytes Absolute: 0.6 10*3/uL (ref 0.1–1.0)
Monocytes Relative: 13 %
Neutro Abs: 3 10*3/uL (ref 1.7–7.7)
Neutrophils Relative %: 66 %
Platelet Count: 201 10*3/uL (ref 150–400)
RBC: 4.15 MIL/uL — ABNORMAL LOW (ref 4.22–5.81)
RDW: 12.4 % (ref 11.5–15.5)
WBC Count: 4.6 10*3/uL (ref 4.0–10.5)
nRBC: 0 % (ref 0.0–0.2)

## 2021-10-20 LAB — CMP (CANCER CENTER ONLY)
ALT: 19 U/L (ref 0–44)
AST: 18 U/L (ref 15–41)
Albumin: 4.3 g/dL (ref 3.5–5.0)
Alkaline Phosphatase: 118 U/L (ref 38–126)
Anion gap: 6 (ref 5–15)
BUN: 12 mg/dL (ref 6–20)
CO2: 29 mmol/L (ref 22–32)
Calcium: 9.3 mg/dL (ref 8.9–10.3)
Chloride: 105 mmol/L (ref 98–111)
Creatinine: 1.08 mg/dL (ref 0.61–1.24)
GFR, Estimated: >60 mL/min (ref >60)
Glucose, Bld: 107 mg/dL — ABNORMAL HIGH (ref 70–99)
Potassium: 4.4 mmol/L (ref 3.5–5.1)
Sodium: 140 mmol/L (ref 135–145)
Total Bilirubin: 0.6 mg/dL (ref 0.3–1.2)
Total Protein: 6.7 g/dL (ref 6.5–8.1)

## 2021-10-20 LAB — LACTATE DEHYDROGENASE: LDH: 153 U/L (ref 98–192)

## 2021-10-20 MED ORDER — SODIUM CHLORIDE 0.9% FLUSH
10.0000 mL | Freq: Once | INTRAVENOUS | Status: AC
  Administered 2021-10-20: 10 mL via INTRAVENOUS

## 2021-10-20 MED ORDER — SODIUM CHLORIDE 0.9 % IV SOLN
40.0000 mg | Freq: Once | INTRAVENOUS | Status: AC
  Administered 2021-10-20: 40 mg via INTRAVENOUS
  Filled 2021-10-20: qty 4

## 2021-10-20 MED ORDER — ACETAMINOPHEN 325 MG PO TABS
650.0000 mg | ORAL_TABLET | Freq: Once | ORAL | Status: AC
  Administered 2021-10-20: 650 mg via ORAL
  Filled 2021-10-20: qty 2

## 2021-10-20 MED ORDER — HEPARIN SOD (PORK) LOCK FLUSH 100 UNIT/ML IV SOLN
500.0000 [IU] | Freq: Once | INTRAVENOUS | Status: AC | PRN
  Administered 2021-10-20: 500 [IU]

## 2021-10-20 MED ORDER — DIPHENHYDRAMINE HCL 50 MG/ML IJ SOLN
12.5000 mg | Freq: Once | INTRAMUSCULAR | Status: AC
  Administered 2021-10-20: 12.5 mg via INTRAVENOUS
  Filled 2021-10-20: qty 1

## 2021-10-20 MED ORDER — SODIUM CHLORIDE 0.9 % IV SOLN
1000.0000 mg | Freq: Once | INTRAVENOUS | Status: AC
  Administered 2021-10-20: 1000 mg via INTRAVENOUS
  Filled 2021-10-20: qty 40

## 2021-10-20 MED ORDER — SODIUM CHLORIDE 0.9% FLUSH
10.0000 mL | INTRAVENOUS | Status: DC | PRN
  Administered 2021-10-20: 10 mL

## 2021-10-20 MED ORDER — SODIUM CHLORIDE 0.9 % IV SOLN: Freq: Once | INTRAVENOUS | Status: AC

## 2021-10-20 NOTE — Progress Notes (Signed)
Hematology and Oncology Follow Up Visit  James Jennings [id number] [birth year] 51 y.o. 10/20/2021   Principle Diagnosis:  Follicular B- cell non-Hodgkin's lymphoma - Relapsed   Past Therapy:             Rituxan/bendamustine-s/p cycle 6 - completed on 07/03/2016   Current Therapy:  Gazyva  - Cytoxan/ Vincristine/Prednisone  - started 11/07/2019, s/p cycle #4 --  D/c on 03/26/2020 G-CHOP -- s/p cycle #6-- started on 04/03/2020 -completed on 07/13/2020 Maintenance Gazyva-s/p cycle 6/12 --  start on 09/05/2020    Interim History:  James Jennings is here today for follow-up.  He is a little bit tired.  Unfortunately, one of his dogs got sick last night.  That did take her to the emergency bed.  Hopefully, she will be okay.  Otherwise, he is doing okay.  Work is quite stressful for him.  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  I know this was not easy for him.  His son is doing well.  His son is in lacrosse right now.  He really enjoys this.  James Jennings has had no problems with fever.  He has had no nausea or vomiting.  He has had no cough or shortness of breath.  There is been no change in bowel or bladder habits.  He has had no rashes.  There is been no leg swelling.  He had a PET scan done on 08/14/2021.  This did not show any evidence of active lymphoma.  Overall, I was his performance status is probably ECOG 0.     Medications:  Allergies as of 10/20/2021       Reactions   Mango Flavor Swelling, Other (See Comments)   LIPS SWELL   Rituximab Other (See Comments)   Shellfish Allergy Anaphylaxis   Lactose Intolerance (gi) Diarrhea        Medication List        Accurate as of October 20, 2021  8:43 AM. If you have any questions, ask your nurse or doctor.          acetaminophen 325 MG tablet Commonly known as: TYLENOL Take 650 mg by mouth every 6 (six) hours as needed for moderate pain or headache.   atorvastatin 80 MG tablet Commonly known as: LIPITOR Take 1 tablet  (80 mg total) by mouth daily.   cetirizine 10 MG tablet Commonly known as: ZYRTEC Take 10 mg by mouth daily as needed for allergies.   ibuprofen 200 MG tablet Commonly known as: ADVIL Take 600 mg by mouth every 8 (eight) hours as needed for mild pain or moderate pain.   lidocaine-prilocaine cream Commonly known as: EMLA Apply 1 application topically as needed. Apply to [facility] 1 hour prior to procedure.   metFORMIN 500 MG tablet Commonly known as: GLUCOPHAGE Take 1 tablet (500 mg total) by mouth daily.   montelukast 10 MG tablet Commonly known as: SINGULAIR TAKE 1 TABLET BY MOUTH EVERYDAY AT BEDTIME   multivitamin Tabs tablet Take 1 tablet by mouth daily.   polycarbophil 625 MG tablet Commonly known as: FIBERCON Take 625 mg by mouth daily.   Semaglutide (1 MG/DOSE) 4 MG/3ML Sopn Inject 1 mg as directed once a week.        Allergies:  Allergies  Allergen Reactions   Mango Flavor Swelling and Other (See Comments)    LIPS SWELL   Rituximab Other (See Comments)   Shellfish Allergy Anaphylaxis   Lactose Intolerance (Gi) Diarrhea  Past Medical History, Surgical history, Social history, and Family History were reviewed and updated.  Review of Systems: Review of Systems  Constitutional: Negative.   HENT: Negative.    Eyes: Negative.   Respiratory: Negative.    Cardiovascular: Negative.   Gastrointestinal: Negative.   Genitourinary: Negative.   Musculoskeletal: Negative.   Skin: Negative.   Neurological: Negative.   Endo/Heme/Allergies: Negative.   Psychiatric/Behavioral: Negative.       Physical Exam:  height is 5' 11.46" (1.815 m) and weight is 274 lb (124.3 kg). His oral temperature is 97.7 F (36.5 C). His blood pressure is 120/81 and his pulse is 92. His respiration is 18 and oxygen saturation is 97%.   Wt Readings from Last 3 Encounters:  10/20/21 274 lb (124.3 kg)  10/20/21 274 lb (124.3 kg)  08/18/21 274 lb (124.3 kg)  His vital signs are  temperature of 98.3.  Pulse 86.  Blood pressure 109/76.  Weight is 274 pounds.  Physical Exam Vitals reviewed.  HENT:     Head: Normocephalic and atraumatic.  Eyes:     Pupils: Pupils are equal, round, and reactive to light.  Cardiovascular:     Rate and Rhythm: Normal rate and regular rhythm.     Heart sounds: Normal heart sounds.  Pulmonary:     Effort: Pulmonary effort is normal.     Breath sounds: Normal breath sounds.  Abdominal:     General: Bowel sounds are normal.     Palpations: Abdomen is soft.  Musculoskeletal:        General: No tenderness or deformity. Normal range of motion.     Cervical back: Normal range of motion.  Lymphadenopathy:     Cervical: No cervical adenopathy.  Skin:    General: Skin is warm and dry.     Findings: No erythema or rash.  Neurological:     Mental Status: He is alert and oriented to person, place, and time.  Psychiatric:        Behavior: Behavior normal.        Thought Content: Thought content normal.        Judgment: Judgment normal.      Lab Results  Component Value Date   WBC 4.6 10/20/2021   HGB 13.1 10/20/2021   HCT 38.3 (L) 10/20/2021   MCV 92.3 10/20/2021   PLT 201 10/20/2021   No results found for: "FERRITIN", "IRON", "TIBC", "UIBC", "IRONPCTSAT" Lab Results  Component Value Date   RETICCTPCT 1.07 12/06/2015   RBC 4.15 (L) 10/20/2021   RETICCTABS 48.79 12/06/2015   No results found for: "KPAFRELGTCHN", "LAMBDASER", "KAPLAMBRATIO" Lab Results  Component Value Date   IGGSERUM 644 12/25/2020   IGA 137 12/25/2020   IGMSERUM 17 (L) 12/25/2020   Lab Results  Component Value Date   TOTALPROTELP 6.1 12/25/2020   ALBUMINELP 3.7 12/25/2020   A1GS 0.2 12/25/2020   A2GS 0.7 12/25/2020   BETS 1.0 12/25/2020   GAMS 0.5 12/25/2020   MSPIKE Not Observed 12/25/2020     Chemistry      Component Value Date/Time   NA 138 08/18/2021 0810   NA 139 12/18/2020 0817   NA 141 12/03/2016 1155   NA 141 02/07/2016 1149   K  4.3 08/18/2021 0810   K 3.8 12/03/2016 1155   K 4.3 02/07/2016 1149   CL 104 08/18/2021 0810   CL 100 12/03/2016 1155   CO2 28 08/18/2021 0810   CO2 27 12/03/2016 1155   CO2 27 02/07/2016 1149  BUN 18 08/18/2021 0810   BUN 16 12/18/2020 0817   BUN 10 12/03/2016 1155   BUN 10.9 02/07/2016 1149   CREATININE 0.98 08/18/2021 0810   CREATININE 1.0 12/03/2016 1155   CREATININE 0.9 02/07/2016 1149      Component Value Date/Time   CALCIUM 9.1 08/18/2021 0810   CALCIUM 9.1 12/03/2016 1155   CALCIUM 9.6 02/07/2016 1149   ALKPHOS 120 08/18/2021 0810   ALKPHOS 99 (H) 12/03/2016 1155   ALKPHOS 127 02/07/2016 1149   AST 15 08/18/2021 0810   AST 17 02/07/2016 1149   ALT 17 08/18/2021 0810   ALT 24 12/03/2016 1155   ALT 16 02/07/2016 1149   BILITOT 0.5 08/18/2021 0810   BILITOT 0.42 02/07/2016 1149       Impression and Plan: James Jennings is a pleasant 51 yo caucasian gentleman with history of follicular large cell non-Hodgkin's lymphoma. He completed 6 cycles of chemotherapy with bendamustine on 07/03/2016 but was not tolerant of Rituxan (anaphylaxis). His lymphoma has  recurred.  We then treated him with CHOP.  This got him into remission.  He is on maintenance Gazyva now.  We will go ahead with his sixth cycle of treatment.  He will have a total of 12 cycles.  We are going every 2 months.  I do not think we need another PET scan probably until the end of the year.  I will see him back in 2 months.    [name], MD 9/18/20238:43 AM

## 2021-10-21 ENCOUNTER — Other Ambulatory Visit: Payer: Self-pay

## 2021-10-22 ENCOUNTER — Other Ambulatory Visit: Payer: Self-pay

## 2021-10-29 ENCOUNTER — Other Ambulatory Visit: Payer: Self-pay

## 2021-10-30 ENCOUNTER — Other Ambulatory Visit: Payer: Self-pay

## 2021-11-03 ENCOUNTER — Other Ambulatory Visit: Payer: Self-pay

## 2021-11-11 ENCOUNTER — Other Ambulatory Visit (HOSPITAL_COMMUNITY): Payer: Self-pay

## 2021-11-13 DIAGNOSIS — E119 Type 2 diabetes mellitus without complications: Secondary | ICD-10-CM | POA: Diagnosis not present

## 2021-11-13 DIAGNOSIS — Z23 Encounter for immunization: Secondary | ICD-10-CM | POA: Diagnosis not present

## 2021-11-13 DIAGNOSIS — C8223 Follicular lymphoma grade III, unspecified, intra-abdominal lymph nodes: Secondary | ICD-10-CM | POA: Diagnosis not present

## 2021-12-01 ENCOUNTER — Emergency Department (HOSPITAL_COMMUNITY): Payer: BC Managed Care – PPO

## 2021-12-01 ENCOUNTER — Inpatient Hospital Stay (HOSPITAL_COMMUNITY): Payer: BC Managed Care – PPO

## 2021-12-01 ENCOUNTER — Inpatient Hospital Stay (HOSPITAL_COMMUNITY)
Admission: EM | Admit: 2021-12-01 | Discharge: 2021-12-04 | DRG: 062 | Disposition: A | Discharge status: Home / Self Care | Payer: BC Managed Care – PPO | Attending: Neurology | Admitting: Neurology

## 2021-12-01 DIAGNOSIS — Z6837 Body mass index (BMI) 37.0-37.9, adult: Secondary | ICD-10-CM | POA: Diagnosis not present

## 2021-12-01 DIAGNOSIS — R29703 NIHSS score 3: Secondary | ICD-10-CM | POA: Diagnosis present

## 2021-12-01 DIAGNOSIS — E1165 Type 2 diabetes mellitus with hyperglycemia: Secondary | ICD-10-CM | POA: Diagnosis not present

## 2021-12-01 DIAGNOSIS — G4730 Sleep apnea, unspecified: Secondary | ICD-10-CM | POA: Diagnosis present

## 2021-12-01 DIAGNOSIS — F419 Anxiety disorder, unspecified: Secondary | ICD-10-CM | POA: Diagnosis not present

## 2021-12-01 DIAGNOSIS — Z79899 Other long term (current) drug therapy: Secondary | ICD-10-CM | POA: Diagnosis not present

## 2021-12-01 DIAGNOSIS — Z7985 Long-term (current) use of injectable non-insulin antidiabetic drugs: Secondary | ICD-10-CM

## 2021-12-01 DIAGNOSIS — R519 Headache, unspecified: Secondary | ICD-10-CM | POA: Diagnosis not present

## 2021-12-01 DIAGNOSIS — Z66 Do not resuscitate: Secondary | ICD-10-CM | POA: Diagnosis not present

## 2021-12-01 DIAGNOSIS — E739 Lactose intolerance, unspecified: Secondary | ICD-10-CM | POA: Diagnosis present

## 2021-12-01 DIAGNOSIS — Q2112 Patent foramen ovale: Secondary | ICD-10-CM | POA: Diagnosis not present

## 2021-12-01 DIAGNOSIS — Z7984 Long term (current) use of oral hypoglycemic drugs: Secondary | ICD-10-CM | POA: Diagnosis not present

## 2021-12-01 DIAGNOSIS — I10 Essential (primary) hypertension: Secondary | ICD-10-CM | POA: Diagnosis present

## 2021-12-01 DIAGNOSIS — E669 Obesity, unspecified: Secondary | ICD-10-CM | POA: Diagnosis present

## 2021-12-01 DIAGNOSIS — E1159 Type 2 diabetes mellitus with other circulatory complications: Secondary | ICD-10-CM | POA: Diagnosis not present

## 2021-12-01 DIAGNOSIS — I639 Cerebral infarction, unspecified: Secondary | ICD-10-CM | POA: Diagnosis not present

## 2021-12-01 DIAGNOSIS — E7800 Pure hypercholesterolemia, unspecified: Secondary | ICD-10-CM | POA: Diagnosis present

## 2021-12-01 DIAGNOSIS — Z9102 Food additives allergy status: Secondary | ICD-10-CM | POA: Diagnosis not present

## 2021-12-01 DIAGNOSIS — I6389 Other cerebral infarction: Secondary | ICD-10-CM | POA: Diagnosis not present

## 2021-12-01 DIAGNOSIS — I6522 Occlusion and stenosis of left carotid artery: Secondary | ICD-10-CM | POA: Diagnosis not present

## 2021-12-01 DIAGNOSIS — Z91013 Allergy to seafood: Secondary | ICD-10-CM

## 2021-12-01 DIAGNOSIS — R4182 Altered mental status, unspecified: Secondary | ICD-10-CM | POA: Diagnosis not present

## 2021-12-01 DIAGNOSIS — I63432 Cerebral infarction due to embolism of left posterior cerebral artery: Principal | ICD-10-CM | POA: Diagnosis present

## 2021-12-01 DIAGNOSIS — R42 Dizziness and giddiness: Secondary | ICD-10-CM | POA: Diagnosis not present

## 2021-12-01 DIAGNOSIS — E119 Type 2 diabetes mellitus without complications: Secondary | ICD-10-CM | POA: Diagnosis not present

## 2021-12-01 DIAGNOSIS — Z9221 Personal history of antineoplastic chemotherapy: Secondary | ICD-10-CM

## 2021-12-01 DIAGNOSIS — C8223 Follicular lymphoma grade III, unspecified, intra-abdominal lymph nodes: Secondary | ICD-10-CM | POA: Diagnosis not present

## 2021-12-01 DIAGNOSIS — E876 Hypokalemia: Secondary | ICD-10-CM | POA: Diagnosis present

## 2021-12-01 DIAGNOSIS — G43109 Migraine with aura, not intractable, without status migrainosus: Secondary | ICD-10-CM | POA: Diagnosis not present

## 2021-12-01 DIAGNOSIS — C8290 Follicular lymphoma, unspecified, unspecified site: Secondary | ICD-10-CM | POA: Diagnosis present

## 2021-12-01 DIAGNOSIS — Z888 Allergy status to other drugs, medicaments and biological substances status: Secondary | ICD-10-CM | POA: Diagnosis not present

## 2021-12-01 LAB — CBC
HCT: 40.2 % (ref 39.0–52.0)
Hemoglobin: 13.9 g/dL (ref 13.0–17.0)
MCH: 32.4 pg (ref 26.0–34.0)
MCHC: 34.6 g/dL (ref 30.0–36.0)
MCV: 93.7 fL (ref 80.0–100.0)
Platelets: 230 10*3/uL (ref 150–400)
RBC: 4.29 MIL/uL (ref 4.22–5.81)
RDW: 12.7 % (ref 11.5–15.5)
WBC: 7.5 10*3/uL (ref 4.0–10.5)
nRBC: 0 % (ref 0.0–0.2)

## 2021-12-01 LAB — DIFFERENTIAL
Abs Immature Granulocytes: 0.02 10*3/uL (ref 0.00–0.07)
Basophils Absolute: 0 10*3/uL (ref 0.0–0.1)
Basophils Relative: 1 %
Eosinophils Absolute: 0.2 10*3/uL (ref 0.0–0.5)
Eosinophils Relative: 3 %
Immature Granulocytes: 0 %
Lymphocytes Relative: 20 %
Lymphs Abs: 1.5 10*3/uL (ref 0.7–4.0)
Monocytes Absolute: 1.1 10*3/uL — ABNORMAL HIGH (ref 0.1–1.0)
Monocytes Relative: 15 %
Neutro Abs: 4.6 10*3/uL (ref 1.7–7.7)
Neutrophils Relative %: 61 %

## 2021-12-01 LAB — CBG MONITORING, ED: Glucose-Capillary: 131 mg/dL — ABNORMAL HIGH (ref 70–99)

## 2021-12-01 LAB — COMPREHENSIVE METABOLIC PANEL
ALT: 28 U/L (ref 0–44)
AST: 27 U/L (ref 15–41)
Albumin: 4.1 g/dL (ref 3.5–5.0)
Alkaline Phosphatase: 125 U/L (ref 38–126)
Anion gap: 8 (ref 5–15)
BUN: 13 mg/dL (ref 6–20)
CO2: 23 mmol/L (ref 22–32)
Calcium: 9.1 mg/dL (ref 8.9–10.3)
Chloride: 108 mmol/L (ref 98–111)
Creatinine, Ser: 1.16 mg/dL (ref 0.61–1.24)
GFR, Estimated: >60 mL/min (ref >60)
Glucose, Bld: 125 mg/dL — ABNORMAL HIGH (ref 70–99)
Potassium: 3.4 mmol/L — ABNORMAL LOW (ref 3.5–5.1)
Sodium: 139 mmol/L (ref 135–145)
Total Bilirubin: 0.6 mg/dL (ref 0.3–1.2)
Total Protein: 7.3 g/dL (ref 6.5–8.1)

## 2021-12-01 LAB — RAPID URINE DRUG SCREEN, HOSP PERFORMED
Amphetamines: NOT DETECTED
Barbiturates: NOT DETECTED
Benzodiazepines: NOT DETECTED
Cocaine: NOT DETECTED
Opiates: NOT DETECTED
Tetrahydrocannabinol: NOT DETECTED

## 2021-12-01 LAB — URINALYSIS, ROUTINE W REFLEX MICROSCOPIC
Bacteria, UA: NONE SEEN
Bilirubin Urine: NEGATIVE
Glucose, UA: >=500 mg/dL — AB
Hgb urine dipstick: NEGATIVE
Ketones, ur: NEGATIVE mg/dL
Leukocytes,Ua: NEGATIVE
Nitrite: NEGATIVE
Protein, ur: NEGATIVE mg/dL
Specific Gravity, Urine: 1.039 — ABNORMAL HIGH (ref 1.005–1.030)
pH: 5 (ref 5.0–8.0)

## 2021-12-01 LAB — GLUCOSE, CAPILLARY: Glucose-Capillary: 108 mg/dL — ABNORMAL HIGH (ref 70–99)

## 2021-12-01 LAB — I-STAT CHEM 8, ED
BUN: 12 mg/dL (ref 6–20)
Calcium, Ion: 1.14 mmol/L — ABNORMAL LOW (ref 1.15–1.40)
Chloride: 106 mmol/L (ref 98–111)
Creatinine, Ser: 1.1 mg/dL (ref 0.61–1.24)
Glucose, Bld: 124 mg/dL — ABNORMAL HIGH (ref 70–99)
HCT: 41 % (ref 39.0–52.0)
Hemoglobin: 13.9 g/dL (ref 13.0–17.0)
Potassium: 3.4 mmol/L — ABNORMAL LOW (ref 3.5–5.1)
Sodium: 140 mmol/L (ref 135–145)
TCO2: 22 mmol/L (ref 22–32)

## 2021-12-01 LAB — MRSA NEXT GEN BY PCR, NASAL: MRSA by PCR Next Gen: NOT DETECTED

## 2021-12-01 LAB — APTT: aPTT: 28 seconds (ref 24–36)

## 2021-12-01 LAB — ETHANOL: Alcohol, Ethyl (B): <10 mg/dL (ref <10)

## 2021-12-01 LAB — HIV ANTIBODY (ROUTINE TESTING W REFLEX): HIV Screen 4th Generation wRfx: NONREACTIVE

## 2021-12-01 LAB — PROTIME-INR
INR: 0.9 (ref 0.8–1.2)
Prothrombin Time: 12.5 seconds (ref 11.4–15.2)

## 2021-12-01 MED ORDER — IOHEXOL 350 MG/ML SOLN
75.0000 mL | Freq: Once | INTRAVENOUS | Status: AC | PRN
  Administered 2021-12-01: 75 mL via INTRAVENOUS

## 2021-12-01 MED ORDER — SODIUM CHLORIDE 0.9 % IV SOLN: INTRAVENOUS | Status: DC

## 2021-12-01 MED ORDER — POTASSIUM CHLORIDE 20 MEQ PO PACK
60.0000 meq | PACK | Freq: Once | ORAL | Status: AC
  Administered 2021-12-01: 60 meq via ORAL
  Filled 2021-12-01: qty 3

## 2021-12-01 MED ORDER — ACETAMINOPHEN 160 MG/5ML PO SOLN: 650.0000 mg | ORAL | Status: DC | PRN

## 2021-12-01 MED ORDER — PANTOPRAZOLE SODIUM 40 MG IV SOLR
40.0000 mg | Freq: Every day | INTRAVENOUS | Status: DC
  Administered 2021-12-01: 40 mg via INTRAVENOUS

## 2021-12-01 MED ORDER — ACETAMINOPHEN 650 MG RE SUPP: 650.0000 mg | RECTAL | Status: DC | PRN

## 2021-12-01 MED ORDER — ACETAMINOPHEN 325 MG PO TABS
650.0000 mg | ORAL_TABLET | ORAL | Status: DC | PRN
  Administered 2021-12-02: 650 mg via ORAL

## 2021-12-01 MED ORDER — ACETAMINOPHEN 325 MG PO TABS
650.0000 mg | ORAL_TABLET | ORAL | Status: DC | PRN
  Administered 2021-12-02 (×2): 650 mg via ORAL
  Filled 2021-12-01 (×3): qty 2

## 2021-12-01 MED ORDER — SENNOSIDES-DOCUSATE SODIUM 8.6-50 MG PO TABS: 1.0000 | ORAL_TABLET | Freq: Every evening | ORAL | Status: DC | PRN

## 2021-12-01 MED ORDER — TENECTEPLASE FOR STROKE
25.0000 mg | PACK | Freq: Once | INTRAVENOUS | Status: AC
  Administered 2021-12-01: 25 mg via INTRAVENOUS
  Filled 2021-12-01: qty 10

## 2021-12-01 MED ORDER — PANTOPRAZOLE SODIUM 40 MG IV SOLR
40.0000 mg | Freq: Every day | INTRAVENOUS | Status: DC
  Filled 2021-12-01: qty 10

## 2021-12-01 MED ORDER — STROKE: EARLY STAGES OF RECOVERY BOOK
Freq: Once | Status: AC
  Filled 2021-12-01: qty 1

## 2021-12-01 NOTE — Consult Note (Addendum)
[facility] Telemedicine Consult   Requesting Provider: Dr. Alvino Chapel  Chief Complaint: headache, vision change and word finding difficulty  HPI:  51 year old with history of B-cell lymphoma in remission, diabetes, hypertension presented to ED for code stroke.  Per patient and the wife, he was last seen normal 4 PM at that time patient was driving to attend son's parents-teacher conference.  Wife was called shortly after that he was not feeling well, he stopped in the parking lot and has vision changes not able to see in update on the right side, feeling of and confused.  Wife drove to the parking lot and found him tachycardia with heart rate 135, not thinking right and symptoms seems getting worse.  Also complaining of headache on the left back of head.  EMS was called and sent to ER for evaluation.  Glucose 131, BP 145/91.  Denies numbness, weakness or loss of consciousness.  Per wife, patient had B-cell lymphoma, was treated by chemotherapy in the past but recently in remission.  Diabetes controlled good at home.  No clear history of migraine, not frequent headache at home.  LKW: 4 AM tpa given?:  Yes IR Thrombectomy?  Pending CTA head and neck  Modified Rankin Scale: 0-Completely asymptomatic and back to baseline post- stroke   Exam: Vitals:   12/01/21 1715 12/01/21 1723  BP: (!) 170/93 (!) 145/91  Pulse: (!) 112 (!) 109  Resp: 19 17  SpO2: 97% 97%     Pulse Rate:  [101-120] 109 (10/30 1723) Resp:  [16-26] 17 (10/30 1723) BP: (145-170)/(90-95) 145/91 (10/30 1723) SpO2:  [97 %-98 %] 97 % (10/30 1723)  General - Well nourished, well developed, in no apparent distress.  Ophthalmologic - fundi not visualized due to noncooperation.  Cardiovascular - Regular rhythm and rate.  Neuro - awake, alert, eyes open, orientated to age, place, time.  Intermittent paraphasic errors, follows all simple commands, anomia with difficulty reading, however able to write with some  typos.  No gaze palsy, tracking bilaterally, visual field full on the left, right medial field deficit on the right upper quadrant.  Mild right facial droop. Tongue midline. Bilateral UEs 5/5, no drift. Bilaterally LEs 5/5, no drift. Sensation symmetrical bilaterally, b/l FTN intact, gait not tested.     NIH Stroke Scale  Level Of Consciousness 0=Alert; keenly responsive 1=Arouse to minor stimulation 2=Requires repeated stimulation to arouse or movements to pain 3=postures or unresponsive 0  LOC Questions to Month and Age 76=Answers both questions correctly 1=Answers one question correctly or dysarthria/intubated/trauma/language barrier 2=Answers neither question correctly or aphasia 0  LOC Commands      -Open/Close eyes     -Open/close grip     -Pantomime commands if communication barrier 0=Performs both tasks correctly 1=Performs one task correctly 2=Performs neighter task correctly 0  Best Gaze     -Only assess horizontal gaze 0=Normal 1=Partial gaze palsy 2=Forced deviation, or total gaze paresis 0  Visual 0=No visual loss 1=Partial hemianopia 2=Complete hemianopia 3=Bilateral hemianopia (blind including cortical blindness) 1  Facial Palsy     -Use grimace if obtunded 0=Normal symmetrical movement 1=Minor paralysis (asymmetry) 2=Partial paralysis (lower face) 3=Complete paralysis (upper and lower face) 1  Motor  0=No drift for 10/5 seconds 1=Drift, but does not hit bed 2=Some antigravity effort, hits  bed 3=No effort against gravity, limb falls 4=No movement 0=Amputation/joint fusion Right Arm 0     Leg 0    Left Arm 0     Leg 0  Limb Ataxia     -  FNT/HTS 0=Absent or does not understand or paralyzed or amputation/joint fusion 1=Present in one limb 2=Present in two limbs 0  Sensory 0=Normal 1=Mild to moderate sensory loss 2=Severe to total sensory loss or coma/unresponsive 0  Best Language 0=No aphasia, normal 1=Mild to moderate aphasia 2=Severe aphasia 3=Mute,  global aphasia, or coma/unresponsive 1  Dysarthria 0=Normal 1=Mild to moderate 2=Severe, unintelligible or mute/anarthric 0=intubated/unable to test 0  Extinction/Neglect 0=No abnormality 1=visual/tactile/auditory/spatia/personal inattention/Extinction to bilateral simultaneous stimulation 2=Profound neglect/extinction more than 1 modality  0  Total   3      Imaging Reviewed:  CT HEAD CODE STROKE WO CONTRAST  Result Date: 12/01/2021 CLINICAL DATA:  Code stroke. Tunnel vision, dizziness, headache, unsteady gait, aphasia. EXAM: CT HEAD WITHOUT CONTRAST TECHNIQUE: Contiguous axial images were obtained from the base of the skull through the vertex without intravenous contrast. RADIATION DOSE REDUCTION: This exam was performed according to the departmental dose-optimization program which includes automated exposure control, adjustment of the mA and/or kV according to patient size and/or use of iterative reconstruction technique. COMPARISON:  None Available. FINDINGS: Brain: There is no acute intracranial hemorrhage, extra-axial fluid collection, or acute infarct. Parenchymal volume is normal. The ventricles are normal in size. Gray-white differentiation is preserved There is no mass lesion.  There is no mass effect or midline shift. Vascular: No hyperdense vessel or unexpected calcification. Skull: Normal. Negative for fracture or focal lesion. Sinuses/Orbits: The imaged paranasal sinuses are clear. The globes and orbits are unremarkable. Other: None. ASPECTS [name] Stroke Program Early CT Score) - Ganglionic level infarction (caudate, lentiform nuclei, internal capsule, insula, M1-M3 cortex): 7 - Supraganglionic infarction (M4-M6 cortex): 3 Total score (0-10 with 10 being normal): 10 IMPRESSION: 1. No acute intracranial pathology. 2. ASPECTS is 10 Findings were discussed over the telephone with Dr. Alvino Chapel at 5:15 p.m. Electronically Signed   By: Valetta Mole M.D.   On: 12/01/2021 17:20     Labs  reviewed in epic and pertinent values follow: Platelet 230  Assessment:  51 year old with history of B-cell lymphoma in remission, diabetes, hypertension presented to ED for headache, vision changes, confusion and speech difficulty.  Last seen well 4 PM, NIH score 3.  No current indication for TNK, discussed with patient and wife regarding benefit and risk and alternatives of TNK, they agreed to proceed.  After TNK, patient will have CTA head and neck to rule out LVO.  Patient will be admitted to ICU for post TNK care.   Recommendations:  [facility] ICU for routine post IV thrombolytics care  Check blood pressure and NIHSS every 15 min for 2 h, then every 30 min for 6 h, and finally every hour for 16 h BP goal < 180/105 CT or MRI brain 24 hours post thrombolytics Stat CT head without contrast if acute neuro changes CTA head and neck stat to rule out LVO. No antiplatelet agents or anticoagulants (including heparin for DVT prophylaxis) in first 24 hours No Foley catheter, nasogastric tube, arterial catheter or central venous catheter for 24 hours, unless absolutely necessary Telemetry Euglycemia  Avoid hyperthermia, PRN acetaminophen DVT prophylaxis with SCDs Inpatient Neurology Consultation Discussed with Dr. Alvino Chapel ED physician   Consult Participants: Patient, wife, RN, Dr. Alvino Chapel, stroke response nurse Location of the provider: [facility] Location of the patient: [facility]  Time Code Stroke Page received:  5:00pm Time neurologist arrived:  5:02pm Time NIHSS completed: 5:22pm    This consult was provided via telemedicine with 2-way video and audio communication. The patient/family was  informed that care would be provided in this way and agreed to receive care in this manner.   This patient is critically ill due to stroke symptoms s/p TNK and at significant risk of neurological worsening, death form recurrent stroke, hemorrhagic transformation, bleeding from TNK. This  patient's care requires constant monitoring of vital signs, hemodynamics, respiratory and cardiac monitoring, review of multiple databases, neurological assessment, discussion with family, other specialists and medical decision making of high complexity. I spent 50 minutes of neurocritical care time in the care of this patient.   Rosalin Hawking, MD PhD Stroke Neurology 12/01/2021 5:34 PM    ADDENDEM:  Personally read CTA head and neck, no LVO. Not candidate for thrombectomy. [facility] for post TNK care as above. Discussed with Dr. Alvino Chapel.   Rosalin Hawking, MD PhD Stroke Neurology 12/01/2021 6:03 PM

## 2021-12-01 NOTE — Progress Notes (Signed)
Code stroke cart activated at 1653. ER provider at bedside. Dr Erlinda Hong paged at [phone]. Dr Erlinda Hong on screen at 1702-report given. Linden Dolin, Tele Stroke RN

## 2021-12-01 NOTE — ED Provider Notes (Signed)
[facility] Jennings Provider Note   CSN: [id number] Arrival date & time: 12/01/21  1621     History  Chief Complaint  Patient presents with   Dizziness   Code Stroke    James Jennings is a 51 y.o. male.   Dizziness  Patient brought in for vision changes and some difficulty speaking.  Last normal at 4:00.  Was at the school with his wife and child.  Left to go home separately and realized he could not see on the right side.  States there is an area that is just black.  Does have a slight headache.  No numbness weakness.  Has had some confusion.  Was not able to tell who the president was.  No numbness or weakness.  Not on blood thinners.  Previous non-Hodgkin's lymphoma but is in remission. Past Medical History:  Diagnosis Date   Anxiety    Bilateral swelling of feet    Diabetes mellitus without complication ([name])    High cholesterol    Joint pain    Lactose intolerance    Large cell, follicular non-Hodgkin's lymphoma ([place])    Lymphoma, follicular ([place]) dx'd 11/1749   Multiple food allergies    Other fatigue    PONV (postoperative nausea and vomiting)    Pre-diabetes    Prediabetes    Shortness of breath on exertion    Sleep apnea    "dx'd ~ 2008; never RX'd mask" (02/28/2016)    Home Medications Prior to Admission medications   Medication Sig Start Date End Date Taking? Authorizing Provider  acetaminophen (TYLENOL) 325 MG tablet Take 650 mg by mouth every 6 (six) hours as needed for moderate pain or headache.    [provider]  atorvastatin (LIPITOR) 80 MG tablet Take 1 tablet (80 mg total) by mouth daily. 06/19/21   [name], MD  cetirizine (ZYRTEC) 10 MG tablet Take 10 mg by mouth daily as needed for allergies.     [provider]  ibuprofen (ADVIL) 200 MG tablet Take 600 mg by mouth every 8 (eight) hours as needed for mild pain or moderate pain.    [provider]  lidocaine-prilocaine (EMLA) cream Apply  1 application topically as needed. Apply to [facility] 1 hour prior to procedure. 11/08/19   [name], MD  metFORMIN (GLUCOPHAGE) 500 MG tablet Take 1 tablet (500 mg total) by mouth daily. 06/19/21   [name], MD  multivitamin (ONE-A-DAY MEN'S) TABS tablet Take 1 tablet by mouth daily.    [provider]  polycarbophil (FIBERCON) 625 MG tablet Take 625 mg by mouth daily.    [provider]  Semaglutide, 1 MG/DOSE, 4 MG/3ML SOPN Inject 1 mg as directed once a week. 10/01/21   [name], MD      Allergies    Mango flavor, Rituximab, Shellfish allergy, and Lactose intolerance (gi)    Review of Systems   Review of Systems  Neurological:  Positive for dizziness.    Physical Exam Updated Vital Signs BP (!) 145/84   Pulse 74   Temp 98.7 F (37.1 C)   Resp 12   Ht '5\' 10"'$  (1.778 m)   Wt 123.4 kg   SpO2 95%   BMI 39.03 kg/m  Physical Exam Vitals and nursing note reviewed.  HENT:     Head: Atraumatic.  Eyes:     Extraocular Movements: Extraocular movements intact.  Cardiovascular:     Rate and Rhythm: Normal rate.  Abdominal:  Tenderness: There is abdominal tenderness.  Musculoskeletal:        General: No tenderness.  Skin:    General: Skin is warm.  Neurological:     Mental Status: He is alert.     Comments: Over 100 visual field cut on the right eye it is upper outer and on the left eye it is nasal and upper.  Eye movement intact.  Conjugate gaze. Finger-nose intact bilaterally.  Sensation intact bilaterally.  No neglect.     ED Results / Procedures / Treatments   Labs (all labs ordered are listed, but only abnormal results are displayed) Labs Reviewed  DIFFERENTIAL - Abnormal; Notable for the following components:      Result Value   Monocytes Absolute 1.1 (*)    All other components within normal limits  COMPREHENSIVE METABOLIC PANEL - Abnormal; Notable for the following components:   Potassium 3.4 (*)    Glucose, Bld 125  (*)    All other components within normal limits  URINALYSIS, ROUTINE W REFLEX MICROSCOPIC - Abnormal; Notable for the following components:   Specific Gravity, Urine 1.039 (*)    Glucose, UA >=500 (*)    All other components within normal limits  GLUCOSE, CAPILLARY - Abnormal; Notable for the following components:   Glucose-Capillary 108 (*)    All other components within normal limits  I-STAT CHEM 8, ED - Abnormal; Notable for the following components:   Potassium 3.4 (*)    Glucose, Bld 124 (*)    Calcium, Ion 1.14 (*)    All other components within normal limits  CBG MONITORING, ED - Abnormal; Notable for the following components:   Glucose-Capillary 131 (*)    All other components within normal limits  MRSA NEXT GEN BY PCR, NASAL  ETHANOL  PROTIME-INR  APTT  CBC  RAPID URINE DRUG SCREEN, HOSP PERFORMED  HIV ANTIBODY (ROUTINE TESTING W REFLEX)  LIPID PANEL  HEMOGLOBIN A1C  CBC  COMPREHENSIVE METABOLIC PANEL    EKG None  Radiology CT ANGIO HEAD NECK W WO CM (CODE STROKE)  Result Date: 12/01/2021 CLINICAL DATA:  Vision, dizziness, headache. EXAM: CT ANGIOGRAPHY HEAD AND NECK TECHNIQUE: Multidetector CT imaging of the head and neck was performed using the standard protocol during bolus administration of intravenous contrast. Multiplanar CT image reconstructions and MIPs were obtained to evaluate the vascular anatomy. Carotid stenosis measurements (when applicable) are obtained utilizing NASCET criteria, using the distal internal carotid diameter as the denominator. RADIATION DOSE REDUCTION: This exam was performed according to the departmental dose-optimization program which includes automated exposure control, adjustment of the mA and/or kV according to patient size and/or use of iterative reconstruction technique. CONTRAST:  37m OMNIPAQUE IOHEXOL 350 MG/ML SOLN COMPARISON:  Same-day noncontrast CT head, CT neck 02/27/2016 FINDINGS: CTA NECK FINDINGS Aortic arch: The imaged  aortic arch is normal. The origins of the major branch vessels are patent. The subclavian arteries are patent to the level imaged. Right carotid system: The right common, internal, and external carotid arteries are patent, without hemodynamically significant stenosis or occlusion. There is no dissection or aneurysm. Left carotid system: The left common, internal, and external carotid arteries are patent, with minimal plaque at the bifurcation but without hemodynamically significant stenosis or occlusion. There is no dissection or aneurysm. Vertebral arteries: Vertebral arteries are patent, without hemodynamically significant stenosis or occlusion. There is no dissection or aneurysm. Skeleton: There is no acute osseous abnormality or suspicious osseous lesion. There is mild degenerative change C5-C6. There is no  visible canal hematoma. Other neck: A right chest wall port is noted with the tip off the field of view. The soft tissues of the neck are unremarkable. There is no pathologic lymphadenopathy in the neck. Upper chest: There is right worse than left apical scarring/bullous change. The imaged lung apices are otherwise clear. Review of the MIP images confirms the above findings CTA HEAD FINDINGS Anterior circulation: There is mild calcified plaque in the intracranial ICAs without hemodynamically significant stenosis or occlusion. The bilateral MCAs are patent, without proximal stenosis or occlusion. The bilateral ACAs are patent, without proximal stenosis or occlusion. There is no aneurysm or AVM. Posterior circulation: The bilateral V4 segments are patent. The basilar artery is patent. The major cerebellar arteries appear patent. The bilateral PCAs are patent, without proximal stenosis or occlusion. There is no aneurysm or AVM. Venous sinuses: Patent. Anatomic variants: None. Review of the MIP images confirms the above findings IMPRESSION: Patent vasculature of the head and neck with no hemodynamically  significant stenosis or occlusion. Electronically Signed   By: Valetta Mole M.D.   On: 12/01/2021 18:11   CT HEAD CODE STROKE WO CONTRAST  Result Date: 12/01/2021 CLINICAL DATA:  Code stroke. Tunnel vision, dizziness, headache, unsteady gait, aphasia. EXAM: CT HEAD WITHOUT CONTRAST TECHNIQUE: Contiguous axial images were obtained from the base of the skull through the vertex without intravenous contrast. RADIATION DOSE REDUCTION: This exam was performed according to the departmental dose-optimization program which includes automated exposure control, adjustment of the mA and/or kV according to patient size and/or use of iterative reconstruction technique. COMPARISON:  None Available. FINDINGS: Brain: There is no acute intracranial hemorrhage, extra-axial fluid collection, or acute infarct. Parenchymal volume is normal. The ventricles are normal in size. Gray-white differentiation is preserved There is no mass lesion.  There is no mass effect or midline shift. Vascular: No hyperdense vessel or unexpected calcification. Skull: Normal. Negative for fracture or focal lesion. Sinuses/Orbits: The imaged paranasal sinuses are clear. The globes and orbits are unremarkable. Other: None. ASPECTS [facility] Stroke Program Early CT Score) - Ganglionic level infarction (caudate, lentiform nuclei, internal capsule, insula, M1-M3 cortex): 7 - Supraganglionic infarction (M4-M6 cortex): 3 Total score (0-10 with 10 being normal): 10 IMPRESSION: 1. No acute intracranial pathology. 2. ASPECTS is 10 Findings were discussed over the telephone with Dr. Alvino Chapel at 5:15 p.m. Electronically Signed   By: Valetta Mole M.D.   On: 12/01/2021 17:20    Procedures Procedures    Medications Ordered in ED Medications   stroke: early stages of recovery book (has no administration in time range)  0.9 %  sodium chloride infusion ( Intravenous Paused 12/01/21 2011)  acetaminophen (TYLENOL) tablet 650 mg (has no administration in time  range)    Or  acetaminophen (TYLENOL) 160 MG/5ML solution 650 mg (has no administration in time range)    Or  acetaminophen (TYLENOL) suppository 650 mg (has no administration in time range)  senna-docusate (Senokot-S) tablet 1 tablet (has no administration in time range)  pantoprazole (PROTONIX) injection 40 mg (40 mg Intravenous Given 12/01/21 2245)  0.9 %  sodium chloride infusion ( Intravenous New Bag/Given 12/01/21 2248)  acetaminophen (TYLENOL) tablet 650 mg (has no administration in time range)    Or  acetaminophen (TYLENOL) 160 MG/5ML solution 650 mg (has no administration in time range)    Or  acetaminophen (TYLENOL) suppository 650 mg (has no administration in time range)  senna-docusate (Senokot-S) tablet 1 tablet (has no administration in time range)  pantoprazole (PROTONIX)  injection 40 mg (has no administration in time range)  tenecteplase (TNKASE) injection for Stroke 25 mg (25 mg Intravenous Given 12/01/21 1728)  iohexol (OMNIPAQUE) 350 MG/ML injection 75 mL (75 mLs Intravenous Contrast Given 12/01/21 1736)  potassium chloride (KLOR-CON) packet 60 mEq (60 mEq Oral Given 12/01/21 2246)    ED Course/ Medical Decision Making/ A&P                           Medical Decision Making Amount and/or Complexity of Data Reviewed Radiology: ordered.  Risk Prescription drug management. Decision regarding hospitalization.   Patient came in with vision loss.  Also some difficulty speaking.  Cannot say who the president is.  Mild confusion.  Does have the visual cut with a last normal 4:00.  Code stroke called in triage.  Saw patient and agree with the findings.  Seen by myself and Dr. Erlinda Hong from neurology who is on telemedicine.  Initial head CT independently interpreted and showed no bleeding or definite stroke.  TNK given.  Blood pressure elevated but not exclusion criteria level.  Sugar reassuring.  After TNK we will get CTA for further evaluation. Continue to evaluate patient.   Minimal asymmetry of pupils but headache improved.  Doubt this is significant.  CRITICAL CARE Performed by: Davonna Belling Total critical care time: 30 minutes Critical care time was exclusive of separately billable procedures and treating other patients. Critical care was necessary to treat or prevent imminent or life-threatening deterioration. Critical care was time spent personally by me on the following activities: development of treatment plan with patient and/or surrogate as well as nursing, discussions with consultants, evaluation of patient's response to treatment, examination of patient, obtaining history from patient or surrogate, ordering and performing treatments and interventions, ordering and review of laboratory studies, ordering and review of radiographic studies, pulse oximetry and re-evaluation of patient's condition.         Final Clinical Impression(s) / ED Diagnoses Final diagnoses:  Cerebrovascular accident (CVA), unspecified mechanism [facility]    Rx / [place] Orders ED Discharge Orders     None         Davonna Belling, MD 12/01/21 2323

## 2021-12-01 NOTE — ED Notes (Signed)
Tnk given, pharm at bedside.

## 2021-12-01 NOTE — ED Notes (Signed)
Pt in bed, pt states that his headache is better than it was, states that his pain is now a 2/10

## 2021-12-01 NOTE — ED Notes (Signed)
Md aware of pupil variation and 3/10 headache, md at bedside for eval, no new orders.

## 2021-12-01 NOTE — H&P (Signed)
Stroke Neurology Admission History & Physical   CC: Headache, vision change, word finding difficulty  History is obtained from: Chart, patient  HPI: James Jennings is a 51 y.o. male past medical history of B-cell lymphoma in remission, diabetes, hypertension, sleep apnea, seen as an acute code stroke by Dr. Erlinda Hong at [facility] this afternoon-briefly, last known well at 4 PM, driving back from sons parent-teacher conference when suddenly started having some vision changes, feeling somewhat confused and not being able to see on the right side.  Had a heart rate of 135 according to the wife.  Also complained of a headache.  EMS called, sent to ER for evaluation where he was seen by telestroke service.  No history of migraines. Disabling symptoms-risk benefits alternatives of IV TNKase discussed by the telestroke doctor and IV TNKase administered 1728 hrs. Reports no headaches. Reports ongoing word finding difficulty off-and-on.   LKW: 1600 hrs. IV thrombolysis given?:  Yes-by telestroke Dr. Erlinda Hong Premorbid modified Rankin scale (mRS): 0  ROS: Full ROS was performed and is negative except as noted in the HPI.   Past Medical History:  Diagnosis Date   Anxiety    Bilateral swelling of feet    Diabetes mellitus without complication ([name])    High cholesterol    Joint pain    Lactose intolerance    Large cell, follicular non-Hodgkin's lymphoma ([name])    Lymphoma, follicular ([place]) dx'd 56/3875   Multiple food allergies    Other fatigue    PONV (postoperative nausea and vomiting)    Pre-diabetes    Prediabetes    Shortness of breath on exertion    Sleep apnea    "dx'd ~ 2008; never RX'd mask" (02/28/2016)   Family History  Problem Relation Age of Onset   Colon cancer Neg Hx    Colon polyps Neg Hx    Esophageal cancer Neg Hx    Stomach cancer Neg Hx    Rectal cancer Neg Hx    Social History:   reports that he quit smoking about 4 years ago. His smoking use included cigarettes. He has a 30.00  pack-year smoking history. He quit smokeless tobacco use about 26 years ago.  His smokeless tobacco use included snuff. He reports that he does not currently use alcohol. He reports that he does not currently use drugs after having used the following drugs: Marijuana.  Medications  Current Facility-Administered Medications:    [START ON 12/02/2021]  stroke: early stages of recovery book, , Does not apply, Once, Rosalin Hawking, MD   0.9 %  sodium chloride infusion, , Intravenous, Continuous, Rosalin Hawking, MD, Last Rate: 50 mL/hr at 12/01/21 1852, New Bag at 12/01/21 1852   0.9 %  sodium chloride infusion, , Intravenous, Continuous, Amie Portland, MD   acetaminophen (TYLENOL) tablet 650 mg, 650 mg, Oral, Q4H PRN **OR** acetaminophen (TYLENOL) 160 MG/5ML solution 650 mg, 650 mg, Per Tube, Q4H PRN **OR** acetaminophen (TYLENOL) suppository 650 mg, 650 mg, Rectal, Q4H PRN, Rosalin Hawking, MD   acetaminophen (TYLENOL) tablet 650 mg, 650 mg, Oral, Q4H PRN **OR** acetaminophen (TYLENOL) 160 MG/5ML solution 650 mg, 650 mg, Per Tube, Q4H PRN **OR** acetaminophen (TYLENOL) suppository 650 mg, 650 mg, Rectal, Q4H PRN, Amie Portland, MD   pantoprazole (PROTONIX) injection 40 mg, 40 mg, Intravenous, QHS, Rosalin Hawking, MD   pantoprazole (PROTONIX) injection 40 mg, 40 mg, Intravenous, QHS, Amie Portland, MD   senna-docusate (Senokot-S) tablet 1 tablet, 1 tablet, Oral, QHS PRN, Rosalin Hawking, MD  senna-docusate (Senokot-S) tablet 1 tablet, 1 tablet, Oral, QHS PRN, Amie Portland, MD   Exam: Current vital signs: BP (!) 150/86   Pulse 84   Temp 98.7 F (37.1 C)   Resp (!) 21   Wt 123.4 kg   SpO2 98%   BMI 37.45 kg/m  Vital signs in last 24 hours: Temp:  [98.5 F (36.9 C)-98.7 F (37.1 C)] 98.7 F (37.1 C) (10/30 1859) Pulse Rate:  [77-120] 84 (10/30 1920) Resp:  [12-26] 21 (10/30 1920) BP: (133-170)/(75-97) 150/86 (10/30 1920) SpO2:  [95 %-100 %] 98 % (10/30 1920) Weight:  [123.4 kg] 123.4 kg (10/30  1723)  General: Awake alert in no distress HEENT: Normocephalic atraumatic CVS: Regular rhythm Respiratory: Breathing well saturating normally on room air Abdomen nondistended nontender Extremities warm well perfused without edema Neurological exam Awake alert oriented x3 No dysarthria No evidence of aphasia on examination with the stroke scale cards but does report some word finding difficulty off-and-on which I was not able to elicit at the time of my encounter. Cranial nerves II to XII intact Motor examination with no drift Sensation intact to light touch Coordination with no dysmetria NIH stroke scale-0  Labs I have reviewed labs in epic and the results pertinent to this consultation are:  CBC    Component Value Date/Time   WBC 7.5 12/01/2021 1720   RBC 4.29 12/01/2021 1720   HGB 13.9 12/01/2021 1720   HGB 13.1 10/20/2021 0813   HGB 14.0 08/10/2018 1604   HGB 14.4 12/03/2016 1155   HGB 14.2 12/06/2015 1606   HCT 40.2 12/01/2021 1720   HCT 41.3 08/10/2018 1604   HCT 41.1 12/03/2016 1155   HCT 40.9 12/06/2015 1606   PLT 230 12/01/2021 1720   PLT 201 10/20/2021 0813   PLT 227 12/03/2016 1155   PLT 257 12/06/2015 1606   MCV 93.7 12/01/2021 1720   MCV 92 08/10/2018 1604   MCV 92 12/03/2016 1155   MCV 89.7 12/06/2015 1606   MCH 32.4 12/01/2021 1720   MCHC 34.6 12/01/2021 1720   RDW 12.7 12/01/2021 1720   RDW 12.8 08/10/2018 1604   RDW 12.9 12/03/2016 1155   RDW 13.0 12/06/2015 1606   LYMPHSABS 1.5 12/01/2021 1720   LYMPHSABS 1.5 08/10/2018 1604   LYMPHSABS 1.1 12/03/2016 1155   LYMPHSABS 3.1 12/06/2015 1606   MONOABS 1.1 (H) 12/01/2021 1720   MONOABS 0.7 12/06/2015 1606   EOSABS 0.2 12/01/2021 1720   EOSABS 0.2 08/10/2018 1604   EOSABS 0.3 12/03/2016 1155   BASOSABS 0.0 12/01/2021 1720   BASOSABS 0.0 08/10/2018 1604   BASOSABS 0.0 12/03/2016 1155   BASOSABS 0.0 12/06/2015 1606    CMP     Component Value Date/Time   NA 139 12/01/2021 1720   NA 139  12/18/2020 0817   NA 141 12/03/2016 1155   NA 141 02/07/2016 1149   K 3.4 (L) 12/01/2021 1720   K 3.8 12/03/2016 1155   K 4.3 02/07/2016 1149   CL 108 12/01/2021 1720   CL 100 12/03/2016 1155   CO2 23 12/01/2021 1720   CO2 27 12/03/2016 1155   CO2 27 02/07/2016 1149   GLUCOSE 125 (H) 12/01/2021 1720   GLUCOSE 128 (H) 12/03/2016 1155   BUN 13 12/01/2021 1720   BUN 16 12/18/2020 0817   BUN 10 12/03/2016 1155   BUN 10.9 02/07/2016 1149   CREATININE 1.16 12/01/2021 1720   CREATININE 1.08 10/20/2021 0813   CREATININE 1.0 12/03/2016 1155   CREATININE  0.9 02/07/2016 1149   CALCIUM 9.1 12/01/2021 1720   CALCIUM 9.1 12/03/2016 1155   CALCIUM 9.6 02/07/2016 1149   PROT 7.3 12/01/2021 1720   PROT 6.5 12/18/2020 0817   PROT 7.1 12/03/2016 1155   PROT 7.4 02/07/2016 1149   ALBUMIN 4.1 12/01/2021 1720   ALBUMIN 4.4 12/18/2020 0817   ALBUMIN 3.9 02/07/2016 1149   AST 27 12/01/2021 1720   AST 18 10/20/2021 0813   AST 17 02/07/2016 1149   ALT 28 12/01/2021 1720   ALT 19 10/20/2021 0813   ALT 24 12/03/2016 1155   ALT 16 02/07/2016 1149   ALKPHOS 125 12/01/2021 1720   ALKPHOS 99 (H) 12/03/2016 1155   ALKPHOS 127 02/07/2016 1149   BILITOT 0.6 12/01/2021 1720   BILITOT 0.6 10/20/2021 0813   BILITOT 0.42 02/07/2016 1149   GFRNONAA >60 12/01/2021 1720   GFRNONAA >60 10/20/2021 0813   GFRAA 107 03/14/2020 0839   GFRAA >60 10/31/2019 0850    Lipid Panel     Component Value Date/Time   CHOL 171 12/18/2020 0817   TRIG 101 12/18/2020 0817   HDL 42 12/18/2020 0817   CHOLHDL 4.5 06/25/2020 0722   LDLCALC 110 (H) 12/18/2020 0817     Imaging I have reviewed the images obtained:  CT head no bleed CT angio with no significant stenosis or occlusion.   Assessment: 51 year old presenting with sudden onset of headache, visual field deficits/blurred vision as well as word finding difficulty, given IV TNKase for concerns for acute ischemic stroke. Continues to report off-and-on word  finding difficulty but no headache and no other complaints. On neurological evaluation, NIH stroke scale is 0 at this point. Admitted to the ICU for post [facility] care.  Other differentials include CNS lymphoma-will need MRI brain with and without contrast 24 hours to evaluate further  Impression: Acute ischemic stroke-status post IV thrombolysis Rule out CNS lymphoma  Plan:  Acute Ischemic Stroke Acuity: Acute Current Suspected Etiology: Investigation Continue Evaluation:  -Admit to: ICU -Hold Aspirin until 24 hour post IV thrombolysis (tPA or TNKase) neuroimaging is stable and without evidence of bleeding -Blood pressure control, goal of SYS <180 -MRI/ECHO/A1C/Lipid panel. -Hyperglycemia management per SSI to maintain glucose 140-'180mg'$ /dL. -PT/OT/ST therapies and recommendations when able  CNS -Close neuro monitoring -PT/OT  RESP No acute issues-monitor clinically  CV Essential (primary) hypertension -Aggressive BP control, goal SBP < 180eart failure  -TTE  Hyperlipidemia, unspecified  - Statin for goal LDL < 70  HEME No active issues Monitor CBC  ENDO Type 2 diabetes mellitus with hyperglycemia  -SSI -goal HgbA1c < 7  GI/GU No active issues Check labs in the morning   Fluid/Electrolyte Disorders Hypokalemia We will replete K Check labs in the morning  ID No active issues Monitor vitals including temperature and monitor labs including CBC  Nutrition E66.9 Obesity-BMI 37.4 -diet consult  Prophylaxis DVT: SCDs only-no antiplatelets or anticoagulants GI: PPI Bowel: Docusate senna  Diet: NPO until cleared by speech  Code Status: Full Code  THE FOLLOWING WERE PRESENT ON ADMISSION: Acute ischemic stroke Essential hypertension Diabetes type 2 B-cell lymphoma-intermittent   CNS -  Acute Ischemic Stroke, Anoxic Brain Injury, Seizures, Status Epilepticus,Toxic Metabolic Hypertensive Encephalopathy, COMA, Cerebral Herniation, Cerebral Edema,  Obstructive Hydrocephalus, Brain Aneurysm, Cerebral AVM, SAH, SDH, ICH, Hemiparesis, Hemiplegia, Loss of Consciousness, Cerebral Cysts, Benign Intracranial Hypertension Respiratory - Ventilator dependent, Respiratory failure, Community Acquired Pneumonia, Probable Aspiration Pneumonia, Non-invasive Mech Ventilation (BiPAP) Cardiovascular - Acute Chronic Systolic Diastolic CHF, NSTEMI, Acute MI,  Shock, Hypertensive Emergency Urgency, Cardiac Arrhythmia, Cardiac Arrest, CPR Infectious - Sepsis, Meningitis, UTI, Arrived with foley catheter from outside facility, Pressure Ulcer GI - Peptic ulcer disease, GI bleeding  Renal -  CKD, ESRD, ARF, Hyponatremia, Hypernatremia Heme-  Coagulopathy secondary to tPA/TNKase at outside hospital, Anemia Cancer - Primary site, Metastatic Cancer, Lymphoma  Trauma - Head trauma, Spine trauma, Paraplegia, Quadriplegia, functional Quadraplegia (bedbound, COMA) Loss of Consciousness from Trauma,  Palliative care DNI/DNR or comfort measures  Risks, benefits, alternatives of IV thrombolysis were discussed by telestroke specialist and family/patient agreed to proceed. -- Amie Portland, MD Neurologist [facility] Pager: [phone]  CRITICAL CARE ATTESTATION Performed by: Amie Portland, MD Total critical care time: 31 minutes Critical care time was exclusive of separately billable procedures and treating other patients and/or supervising APPs/Residents/Students Critical care was necessary to treat or prevent imminent or life-threatening deterioration due to this post IV thrombolysis for presumed acute ischemic stroke This patient is critically ill and at significant risk for neurological worsening and/or death and care requires constant monitoring. Critical care was time spent personally by me on the following activities: development of treatment plan with patient and/or surrogate as well as nursing, discussions with consultants, evaluation of patient's  response to treatment, examination of patient, obtaining history from patient or surrogate, ordering and performing treatments and interventions, ordering and review of laboratory studies, ordering and review of radiographic studies, pulse oximetry, re-evaluation of patient's condition, participation in multidisciplinary rounds and medical decision making of high complexity in the care of this patient.

## 2021-12-01 NOTE — Progress Notes (Addendum)
PHARMACIST CODE STROKE RESPONSE  Notified to mix TNK at 1725 by Dr. Erlinda Hong TNK preparation completed at 1727  TNK dose = 25 mg IV over 5 seconds at 1728  Issues/delays encountered (if applicable): None   Jasline Buskirk S. Alford Highland, PharmD, BCPS Clinical Staff Pharmacist [email or web address]  Wayland Salinas 12/01/21 5:38 PM

## 2021-12-01 NOTE — ED Provider Triage Note (Signed)
Emergency Medicine Provider Triage Evaluation Note  Ahan Eisenberger , a 51 y.o. male  was evaluated in triage.  Pt complains of difficulty w/speech, vision changes for the last 1.5 hours. Family states he is not asking himself and is very slow to respond. Hx of lymphoma.  Review of Systems  Positive: Speech changes, vision changes Negative: Facial droop  Physical Exam  BP (!) 156/90 (BP Location: Left Arm)   Pulse (!) 120   Resp 16   SpO2 98%  Gen:   Awake, no distress   Resp:  Normal effort  MSK:   Moves extremities without difficulty  Other:  Aphasic, no facial droop  Medical Decision Making  Medically screening exam initiated at 4:55 PM.  Appropriate orders placed.  Tykwon Fera was informed that the remainder of the evaluation will be completed by another provider, this initial triage assessment does not replace that evaluation, and the importance of remaining in the ED until their evaluation is complete.  Code stroke activated. Sent to resuscitation bay. Mds notified.    Osvaldo Shipper, [place] 12/01/21 1657

## 2021-12-01 NOTE — ED Triage Notes (Signed)
Pt reports tunnel vision, dizziness, headache, unsteady gait and aphasia 40 min PTA. A&O to self and place

## 2021-12-02 ENCOUNTER — Inpatient Hospital Stay (HOSPITAL_COMMUNITY): Payer: BC Managed Care – PPO

## 2021-12-02 DIAGNOSIS — E7800 Pure hypercholesterolemia, unspecified: Secondary | ICD-10-CM

## 2021-12-02 DIAGNOSIS — I6389 Other cerebral infarction: Secondary | ICD-10-CM | POA: Diagnosis not present

## 2021-12-02 DIAGNOSIS — G43109 Migraine with aura, not intractable, without status migrainosus: Secondary | ICD-10-CM | POA: Diagnosis not present

## 2021-12-02 DIAGNOSIS — I639 Cerebral infarction, unspecified: Secondary | ICD-10-CM | POA: Diagnosis not present

## 2021-12-02 LAB — COMPREHENSIVE METABOLIC PANEL
ALT: 25 U/L (ref 0–44)
AST: 24 U/L (ref 15–41)
Albumin: 3.3 g/dL — ABNORMAL LOW (ref 3.5–5.0)
Alkaline Phosphatase: 102 U/L (ref 38–126)
Anion gap: 10 (ref 5–15)
BUN: 9 mg/dL (ref 6–20)
CO2: 23 mmol/L (ref 22–32)
Calcium: 8.6 mg/dL — ABNORMAL LOW (ref 8.9–10.3)
Chloride: 108 mmol/L (ref 98–111)
Creatinine, Ser: 1.07 mg/dL (ref 0.61–1.24)
GFR, Estimated: >60 mL/min (ref >60)
Glucose, Bld: 117 mg/dL — ABNORMAL HIGH (ref 70–99)
Potassium: 4.2 mmol/L (ref 3.5–5.1)
Sodium: 141 mmol/L (ref 135–145)
Total Bilirubin: 0.8 mg/dL (ref 0.3–1.2)
Total Protein: 5.7 g/dL — ABNORMAL LOW (ref 6.5–8.1)

## 2021-12-02 LAB — LIPID PANEL
Cholesterol: 132 mg/dL (ref 0–200)
HDL: 37 mg/dL — ABNORMAL LOW (ref >40)
LDL Cholesterol: 77 mg/dL (ref 0–99)
Total CHOL/HDL Ratio: 3.6 RATIO
Triglycerides: 92 mg/dL (ref <150)
VLDL: 18 mg/dL (ref 0–40)

## 2021-12-02 LAB — CBC
HCT: 36 % — ABNORMAL LOW (ref 39.0–52.0)
Hemoglobin: 12.6 g/dL — ABNORMAL LOW (ref 13.0–17.0)
MCH: 31.9 pg (ref 26.0–34.0)
MCHC: 35 g/dL (ref 30.0–36.0)
MCV: 91.1 fL (ref 80.0–100.0)
Platelets: 187 10*3/uL (ref 150–400)
RBC: 3.95 MIL/uL — ABNORMAL LOW (ref 4.22–5.81)
RDW: 12.6 % (ref 11.5–15.5)
WBC: 5.5 10*3/uL (ref 4.0–10.5)
nRBC: 0 % (ref 0.0–0.2)

## 2021-12-02 LAB — ECHOCARDIOGRAM COMPLETE
Area-P 1/2: 6.71 cm2
Height: 70 in
S' Lateral: 2.9 cm
Weight: 4352 oz

## 2021-12-02 LAB — HEMOGLOBIN A1C
Hgb A1c MFr Bld: 6.4 % — ABNORMAL HIGH (ref 4.8–5.6)
Mean Plasma Glucose: 136.98 mg/dL

## 2021-12-02 MED ORDER — GADOBUTROL 1 MMOL/ML IV SOLN
10.0000 mL | Freq: Once | INTRAVENOUS | Status: AC | PRN
  Administered 2021-12-02: 10 mL via INTRAVENOUS

## 2021-12-02 MED ORDER — ATORVASTATIN CALCIUM 80 MG PO TABS
80.0000 mg | ORAL_TABLET | Freq: Every day | ORAL | Status: DC
  Administered 2021-12-02 – 2021-12-04 (×3): 80 mg via ORAL
  Filled 2021-12-02 (×3): qty 1

## 2021-12-02 MED ORDER — DAPAGLIFLOZIN PROPANEDIOL 10 MG PO TABS
10.0000 mg | ORAL_TABLET | Freq: Every day | ORAL | Status: DC
  Administered 2021-12-02 – 2021-12-03 (×2): 10 mg via ORAL
  Filled 2021-12-02 (×3): qty 1

## 2021-12-02 MED ORDER — CLOPIDOGREL BISULFATE 75 MG PO TABS
75.0000 mg | ORAL_TABLET | Freq: Every day | ORAL | Status: DC
  Administered 2021-12-02 – 2021-12-04 (×3): 75 mg via ORAL
  Filled 2021-12-02 (×3): qty 1

## 2021-12-02 MED ORDER — CHLORHEXIDINE GLUCONATE CLOTH 2 % EX PADS
6.0000 | MEDICATED_PAD | Freq: Every day | CUTANEOUS | Status: DC
  Administered 2021-12-02: 6 via TOPICAL

## 2021-12-02 MED ORDER — ASPIRIN 81 MG PO TBEC
81.0000 mg | DELAYED_RELEASE_TABLET | Freq: Every day | ORAL | Status: DC
  Administered 2021-12-02 – 2021-12-04 (×3): 81 mg via ORAL
  Filled 2021-12-02 (×3): qty 1

## 2021-12-02 MED ORDER — ORAL CARE MOUTH RINSE: 15.0000 mL | OROMUCOSAL | Status: DC | PRN

## 2021-12-02 MED ORDER — METFORMIN HCL 500 MG PO TABS
500.0000 mg | ORAL_TABLET | Freq: Every day | ORAL | Status: DC
  Administered 2021-12-02 – 2021-12-03 (×2): 500 mg via ORAL
  Filled 2021-12-02 (×2): qty 1

## 2021-12-02 MED ORDER — PANTOPRAZOLE SODIUM 40 MG PO TBEC
40.0000 mg | DELAYED_RELEASE_TABLET | Freq: Every day | ORAL | Status: DC
  Administered 2021-12-02 – 2021-12-03 (×2): 40 mg via ORAL
  Filled 2021-12-02 (×2): qty 1

## 2021-12-02 NOTE — Evaluation (Signed)
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 ([place]) 01/30/2016   Acute appendicitis with rupture 11/13/2015   Acute appendicitis 11/12/2015   Tobacco use 06/25/2011   ACHILLES BURSITIS OR TENDINITIS 10/28/2009   Past Medical History:  Past Medical History:  Diagnosis Date   Anxiety    Bilateral swelling of feet    Diabetes mellitus without complication ([name])    High cholesterol    Joint pain    Lactose intolerance    Large cell, follicular non-Hodgkin's lymphoma ([place])    Lymphoma, follicular ([place]) dx'd 24/0973   Multiple food allergies    Other fatigue    PONV (postoperative nausea and vomiting)    Pre-diabetes    Prediabetes    Shortness of breath on exertion    Sleep apnea    "dx'd ~ 2008; never RX'd mask" (02/28/2016)   Past Surgical History:  Past Surgical History:  Procedure Laterality Date   ACHILLES TENDON SURGERY Left ~ 2012   APPENDECTOMY  11/12/2015   lap appy   CLUB FOOT RELEASE Bilateral 1971   HERNIA REPAIR     INGUINAL LYMPH NODE BIOPSY Right 01/20/2016   Procedure: EXCISIONAL BIOPSY OF RIGHT INGUINAL LYMPH NODE;  Surgeon: Greer Pickerel, MD;  Location: [facility];  Service: General;  Laterality: Right;   IR IMAGING GUIDED PORT INSERTION  11/02/2019   LAPAROSCOPIC APPENDECTOMY N/A 11/12/2015   Procedure: APPENDECTOMY LAPAROSCOPIC;  Surgeon: Greer Pickerel, MD;  Location: [facility];  Service: General;  Laterality: N/A;   LYMPH NODE BIOPSY Left 03/20/2020   Procedure: EXCISIONAL BIOPSY LEFT INGUINAL LYMPH NODE;  Surgeon: Coralie Keens, MD;   Location: [facility];  Service: General;  Laterality: Left;  LMA   SUTURE REMOVAL Left ~ 2016-2017 X 3   "had to use permanent sutures w/my achilles OR; my body rejects them at times & I have to have them surgically removed; under anesthesia"   VASECTOMY Bilateral 07/2019   VENTRAL HERNIA REPAIR  1994   HPI:  Pt is a 51 y.o. male with a PMH of B-cell lymphoma in remission, diabetes, hypertension, sleep apnea, who presented to [facility] with complaints of visual changes, headache and confusion. CT negative for acute findings, MRI head ordered.   Assessment / Plan / Recommendation Clinical Impression   Pt presents with mild expressive aphasia marked by word finding difficulty and mild alexia. Pt reports he is currently experiencing mild word finding and reading difficulty. Pt was alert throughout encounter and oriented to place, date, situation, and person. Oral motor assessment was grossly WFL. [facility] Mental Status assessment was administered to further assess cognitive-linguistic functioning, the pt received a score of 24/30. The pt experienced minor difficulty with divergent naming, naming 14 animals within a minute. Additionally, the pt was unable to independently complete a delayed recall task involving objects, however provided semantic cues, he recalled 5/5. No issues in problem solving, auditory comprehension, orientation, or attention noted. To further assess reading, the pt orally read bullets from a list of functional techniques. The pt showed mild difficulty decoding more complex words, such as "regrouping" and "  mnemonic." Phonetic cues aided pt in decoding the word. The pt performed a written task where he was asked to write a paragraph. The pt completed the task without issues in grammar, spelling or topic maintenance. Breifly discussed strategies to facilitate anomia and memory. 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     SLP Assessment  SLP Recommendation/Assessment: Patient needs continued Speech [place] [facility] SLP Visit Diagnosis: Aphasia (R47.01)    Recommendations for follow up therapy are one component of a multi-disciplinary discharge planning process, led by the attending physician.  Recommendations may be updated based on patient status, additional functional criteria and insurance authorization.    Follow Up Recommendations  Outpatient SLP    Assistance Recommended at Discharge  None  Functional Status Assessment Patient has had a recent decline in their functional status and demonstrates the ability to make significant improvements in function in a reasonable and predictable amount of time.  Frequency and Duration min 2x/week  2 weeks      SLP Evaluation Cognition  Overall Cognitive Status: Within Functional Limits for tasks assessed Arousal/Alertness: Awake/alert Orientation Level: Oriented to person;Oriented to place;Oriented to situation Year: 2023 Day of Week: Correct Attention: Selective;Sustained Sustained Attention: Appears intact Selective Attention: Appears intact Memory: Appears intact Awareness: Appears intact Problem Solving: Appears intact Safety/Judgment: Appears intact       Comprehension  Auditory Comprehension Overall Auditory Comprehension: Appears within functional limits for tasks assessed Yes/No Questions: Not tested Commands: Within Functional Limits Conversation: Complex Visual Recognition/Discrimination Discrimination: Not tested Reading Comprehension Reading Status: Impaired Word level: Within functional limits Sentence Level: Impaired Paragraph Level: Not tested Functional Environmental (signs, name badge): Not tested Effective Techniques: Large print;Verbal cueing    Expression Expression Primary Mode of Expression: Verbal Verbal Expression Overall Verbal Expression: Appears within functional limits  for tasks assessed Initiation: No impairment Level of Generative/Spontaneous Verbalization: Conversation Repetition: No impairment Naming: Impairment Responsive: 76-100% accurate Confrontation: Within functional limits Convergent: Not tested Divergent: 75-100% accurate (14 animals in 1 min) Pragmatics: No impairment Written Expression Dominant Hand: Right Written Expression: Within Functional Limits (wrote paragraph without difficulties)   Oral / Motor  Oral Motor/Sensory Function Overall Oral Motor/Sensory Function: Within functional limits Motor Speech Overall Motor Speech: Appears within functional limits for tasks assessed Respiration: Within functional limits Phonation: Normal Resonance: Within functional limits Articulation: Within functional limitis Intelligibility: Intelligible Motor Planning: Not tested Motor Speech Errors: Not applicable           [employer] Student SLP  12/02/2021, 11:20 AM

## 2021-12-02 NOTE — Progress Notes (Signed)
  Echocardiogram 2D Echocardiogram has been performed.  Darlina Sicilian M 12/02/2021, 9:01 AM

## 2021-12-02 NOTE — Progress Notes (Signed)
STROKE TEAM PROGRESS NOTE   SUBJECTIVE (INTERVAL HISTORY) His wife is at the bedside.  Overall his condition is rapidly improving. He stated that he is almost at baseline but still sometime reading not quite 100%. MRI pending.    OBJECTIVE Temp:  [98 F (36.7 C)-98.7 F (37.1 C)] 98.1 F (36.7 C) (10/31 1555) Pulse Rate:  [61-101] 86 (10/31 1600) Cardiac Rhythm: Normal sinus rhythm (10/31 0800) Resp:  [11-23] 16 (10/31 1600) BP: (85-162)/(52-100) 121/82 (10/31 1600) SpO2:  [91 %-100 %] 94 % (10/31 1600)  Recent Labs  Lab 12/01/21 1657 12/01/21 2042  GLUCAP 131* 108*   Recent Labs  Lab 12/01/21 1710 12/01/21 1720 12/02/21 0423  NA 140 139 141  K 3.4* 3.4* 4.2  CL 106 108 108  CO2  --  23 23  GLUCOSE 124* 125* 117*  BUN '12 13 9  '$ CREATININE 1.10 1.16 1.07  CALCIUM  --  9.1 8.6*   Recent Labs  Lab 12/01/21 1720 12/02/21 0423  AST 27 24  ALT 28 25  ALKPHOS 125 102  BILITOT 0.6 0.8  PROT 7.3 5.7*  ALBUMIN 4.1 3.3*   Recent Labs  Lab 12/01/21 1710 12/01/21 1720 12/02/21 0423  WBC  --  7.5 5.5  NEUTROABS  --  4.6  --   HGB 13.9 13.9 12.6*  HCT 41.0 40.2 36.0*  MCV  --  93.7 91.1  PLT  --  230 187   No results for input(s): "CKTOTAL", "CKMB", "CKMBINDEX", "TROPONINI" in the last 168 hours. Recent Labs    12/01/21 1720  LABPROT 12.5  INR 0.9   Recent Labs    12/01/21 1940  COLORURINE YELLOW  LABSPEC 1.039*  PHURINE 5.0  GLUCOSEU >=500*  HGBUR NEGATIVE  BILIRUBINUR NEGATIVE  KETONESUR NEGATIVE  PROTEINUR NEGATIVE  NITRITE NEGATIVE  LEUKOCYTESUR NEGATIVE       Component Value Date/Time   CHOL 132 12/02/2021 0423   CHOL 171 12/18/2020 0817   TRIG 92 12/02/2021 0423   HDL 37 (L) 12/02/2021 0423   HDL 42 12/18/2020 0817   CHOLHDL 3.6 12/02/2021 0423   VLDL 18 12/02/2021 0423   LDLCALC 77 12/02/2021 0423   LDLCALC 110 (H) 12/18/2020 0817   Lab Results  Component Value Date   HGBA1C 6.4 (H) 12/02/2021      Component Value Date/Time    LABOPIA NONE DETECTED 12/01/2021 1940   COCAINSCRNUR NONE DETECTED 12/01/2021 1940   LABBENZ NONE DETECTED 12/01/2021 1940   AMPHETMU NONE DETECTED 12/01/2021 1940   THCU NONE DETECTED 12/01/2021 1940   LABBARB NONE DETECTED 12/01/2021 1940    Recent Labs  Lab 12/01/21 1704  ETH <10    I have personally reviewed the radiological images below and agree with the radiology interpretations.  ECHOCARDIOGRAM COMPLETE  Result Date: 12/02/2021    ECHOCARDIOGRAM REPORT   Patient Name:   James Jennings  Date of Exam: 12/02/2021 Medical Rec #:  [id number]  Height:       70.0 in Accession #:    [id number] Weight:       272.0 lb Date of Birth:  November 05, 1970 BSA:          2.379 m Patient Age:    51 years   BP:           155/94 mmHg Patient Gender: M          HR:           88 bpm. Exam Location:  Inpatient Procedure: 2D Echo, Cardiac  Doppler and Color Doppler Indications:    Stroke I63.9  History:        Patient has prior history of Echocardiogram examinations, most                 recent 04/01/2020. Risk Factors:Diabetes, Dyslipidemia and Sleep                 Apnea. History of cancer.  Sonographer:    Darlina Sicilian RDCS Referring Phys: [id number] [facility] [address]  Sonographer Comments: Suboptimal apical window. IMPRESSIONS  1. Left ventricular ejection fraction, by estimation, is 60 to 65%. The left ventricle has normal function. The left ventricle has no regional wall motion abnormalities. Left ventricular diastolic parameters were normal.  2. Right ventricular systolic function is normal. The right ventricular size is normal. Tricuspid regurgitation signal is inadequate for assessing PA pressure.  3. The mitral valve is grossly normal. No evidence of mitral valve regurgitation. No evidence of mitral stenosis.  4. The aortic valve was not well visualized. Aortic valve regurgitation is not visualized. No aortic stenosis is present.  5. The inferior vena cava is normal in size with greater than 50% respiratory variability,  suggesting right atrial pressure of 3 mmHg. Comparison(s): Similar to prior. FINDINGS  Left Ventricle: Left ventricular ejection fraction, by estimation, is 60 to 65%. The left ventricle has normal function. The left ventricle has no regional wall motion abnormalities. The left ventricular internal cavity size was normal in size. There is  no left ventricular hypertrophy. Left ventricular diastolic parameters were normal. Right Ventricle: The right ventricular size is normal. Right vetricular wall thickness was not well visualized. Right ventricular systolic function is normal. Tricuspid regurgitation signal is inadequate for assessing PA pressure. Left Atrium: Left atrial size was normal in size. Right Atrium: Right atrial size was normal in size. Pericardium: There is no evidence of pericardial effusion. Mitral Valve: The mitral valve is grossly normal. No evidence of mitral valve regurgitation. No evidence of mitral valve stenosis. Tricuspid Valve: The tricuspid valve is normal in structure. Tricuspid valve regurgitation is not demonstrated. No evidence of tricuspid stenosis. Aortic Valve: The aortic valve was not well visualized. Aortic valve regurgitation is not visualized. No aortic stenosis is present. Pulmonic Valve: The pulmonic valve was not well visualized. Pulmonic valve regurgitation is not visualized. No evidence of pulmonic stenosis. Aorta: The aortic root, ascending aorta and aortic arch are all structurally normal, with no evidence of dilitation or obstruction and the aortic root and ascending aorta are structurally normal, with no evidence of dilitation. Venous: The inferior vena cava is normal in size with greater than 50% respiratory variability, suggesting right atrial pressure of 3 mmHg. IAS/Shunts: No atrial level shunt detected by color flow Doppler.  LEFT VENTRICLE PLAX 2D LVIDd:         4.70 cm   Diastology LVIDs:         2.90 cm   LV e' medial:    10.50 cm/s LV PW:         1.00 cm   LV  E/e' medial:  7.8 LV IVS:        1.10 cm   LV e' lateral:   14.10 cm/s LVOT diam:     2.30 cm   LV E/e' lateral: 5.8 LV SV:         57 LV SV Index:   24 LVOT Area:     4.15 cm  LEFT ATRIUM  Index LA diam:      3.70 cm 1.56 cm/m LA Vol (A4C): 16.8 ml 7.06 ml/m  AORTIC VALVE LVOT Vmax:   71.30 cm/s LVOT Vmean:  53.400 cm/s LVOT VTI:    0.136 m  AORTA Ao Root diam: 3.50 cm Ao Asc diam:  3.40 cm MITRAL VALVE MV Area (PHT): 6.71 cm    SHUNTS MV Decel Time: 113 msec    Systemic VTI:  0.14 m MV E velocity: 81.50 cm/s  Systemic Diam: 2.30 cm MV A velocity: 70.40 cm/s MV E/A ratio:  1.16 Rudean Haskell MD Electronically signed by Rudean Haskell MD Signature Date/Time: 12/02/2021/10:07:44 AM    Final    CT ANGIO HEAD NECK W WO CM (CODE STROKE)  Result Date: 12/01/2021 CLINICAL DATA:  Vision, dizziness, headache. EXAM: CT ANGIOGRAPHY HEAD AND NECK TECHNIQUE: Multidetector CT imaging of the head and neck was performed using the standard protocol during bolus administration of intravenous contrast. Multiplanar CT image reconstructions and MIPs were obtained to evaluate the vascular anatomy. Carotid stenosis measurements (when applicable) are obtained utilizing NASCET criteria, using the distal internal carotid diameter as the denominator. RADIATION DOSE REDUCTION: This exam was performed according to the departmental dose-optimization program which includes automated exposure control, adjustment of the mA and/or kV according to patient size and/or use of iterative reconstruction technique. CONTRAST:  66m OMNIPAQUE IOHEXOL 350 MG/ML SOLN COMPARISON:  Same-day noncontrast CT head, CT neck 02/27/2016 FINDINGS: CTA NECK FINDINGS Aortic arch: The imaged aortic arch is normal. The origins of the major branch vessels are patent. The subclavian arteries are patent to the level imaged. Right carotid system: The right common, internal, and external carotid arteries are patent, without hemodynamically  significant stenosis or occlusion. There is no dissection or aneurysm. Left carotid system: The left common, internal, and external carotid arteries are patent, with minimal plaque at the bifurcation but without hemodynamically significant stenosis or occlusion. There is no dissection or aneurysm. Vertebral arteries: Vertebral arteries are patent, without hemodynamically significant stenosis or occlusion. There is no dissection or aneurysm. Skeleton: There is no acute osseous abnormality or suspicious osseous lesion. There is mild degenerative change C5-C6. There is no visible canal hematoma. Other neck: A right chest wall port is noted with the tip off the field of view. The soft tissues of the neck are unremarkable. There is no pathologic lymphadenopathy in the neck. Upper chest: There is right worse than left apical scarring/bullous change. The imaged lung apices are otherwise clear. Review of the MIP images confirms the above findings CTA HEAD FINDINGS Anterior circulation: There is mild calcified plaque in the intracranial ICAs without hemodynamically significant stenosis or occlusion. The bilateral MCAs are patent, without proximal stenosis or occlusion. The bilateral ACAs are patent, without proximal stenosis or occlusion. There is no aneurysm or AVM. Posterior circulation: The bilateral V4 segments are patent. The basilar artery is patent. The major cerebellar arteries appear patent. The bilateral PCAs are patent, without proximal stenosis or occlusion. There is no aneurysm or AVM. Venous sinuses: Patent. Anatomic variants: None. Review of the MIP images confirms the above findings IMPRESSION: Patent vasculature of the head and neck with no hemodynamically significant stenosis or occlusion. Electronically Signed   By: PValetta MoleM.D.   On: 12/01/2021 18:11   CT HEAD CODE STROKE WO CONTRAST  Result Date: 12/01/2021 CLINICAL DATA:  Code stroke. Tunnel vision, dizziness, headache, unsteady gait, aphasia.  EXAM: CT HEAD WITHOUT CONTRAST TECHNIQUE: Contiguous axial images were obtained from the base of  the skull through the vertex without intravenous contrast. RADIATION DOSE REDUCTION: This exam was performed according to the departmental dose-optimization program which includes automated exposure control, adjustment of the mA and/or kV according to patient size and/or use of iterative reconstruction technique. COMPARISON:  None Available. FINDINGS: Brain: There is no acute intracranial hemorrhage, extra-axial fluid collection, or acute infarct. Parenchymal volume is normal. The ventricles are normal in size. Gray-white differentiation is preserved There is no mass lesion.  There is no mass effect or midline shift. Vascular: No hyperdense vessel or unexpected calcification. Skull: Normal. Negative for fracture or focal lesion. Sinuses/Orbits: The imaged paranasal sinuses are clear. The globes and orbits are unremarkable. Other: None. ASPECTS [facility] Stroke Program Early CT Score) - Ganglionic level infarction (caudate, lentiform nuclei, internal capsule, insula, M1-M3 cortex): 7 - Supraganglionic infarction (M4-M6 cortex): 3 Total score (0-10 with 10 being normal): 10 IMPRESSION: 1. No acute intracranial pathology. 2. ASPECTS is 10 Findings were discussed over the telephone with Dr. Alvino Chapel at 5:15 p.m. Electronically Signed   By: Valetta Mole M.D.   On: 12/01/2021 17:20     PHYSICAL EXAM  Temp:  [98 F (36.7 C)-98.7 F (37.1 C)] 98.1 F (36.7 C) (10/31 1555) Pulse Rate:  [61-101] 86 (10/31 1600) Resp:  [11-23] 16 (10/31 1600) BP: (85-162)/(52-100) 121/82 (10/31 1600) SpO2:  [91 %-100 %] 94 % (10/31 1600)  General - Well nourished, well developed, in no apparent distress.  Ophthalmologic - fundi not visualized due to noncooperation.  Cardiovascular - Regular rhythm and rate.  Mental Status -  Level of arousal and orientation to time, place, and person were intact. Language including expression,  naming, repetition, comprehension was assessed and found intact. Fund of Knowledge was assessed and was intact.  Cranial Nerves II - XII - II - Visual field intact OU. III, IV, VI - Extraocular movements intact. V - Facial sensation intact bilaterally. VII - Facial movement intact bilaterally. VIII - Hearing & vestibular intact bilaterally. X - Palate elevates symmetrically. XI - Chin turning & shoulder shrug intact bilaterally. XII - Tongue protrusion intact.  Motor Strength - The patient's strength was normal in all extremities and pronator drift was absent.  Bulk was normal and fasciculations were absent.   Motor Tone - Muscle tone was assessed at the neck and appendages and was normal.  Reflexes - The patient's reflexes were symmetrical in all extremities and he had no pathological reflexes.  Sensory - Light touch, temperature/pinprick were assessed and were symmetrical.    Coordination - The patient had normal movements in the hands and feet with no ataxia or dysmetria.  Tremor was absent.  Gait and Station - deferred.   ASSESSMENT/PLAN James Jennings is a 51 y.o. male with history of HTN, DM, B cell lymphoma on remission admitted for HA, vision change, confusion and word finding difficulty. TNK given.    Stroke vs complicated migraine - MRI pending CT no acute finding CTA head and neck unremarkable MRI  pending 2D Echo  EF 60-65% LDL 77 HgbA1c 6.4 SCDs for VTE prophylaxis No antithrombotic prior to admission, now on No antithrombotic within 24h of TNK Patient counseled to be compliant with his antithrombotic medications Ongoing aggressive stroke risk factor management Therapy recommendations:  pending Disposition:  pending  Diabetes Home meds: faxiga and metformin HgbA1c 6.4 goal < 7.0 Controlled Currently on faxiga and metformin CBG monitoring SSI DM education and close PCP follow up  Hypertension Stable Long term BP goal normotensive  Hyperlipidemia  Home  meds:  lipitor 80  LDL 77, goal < 70 Now on lipitor 80 Continue statin at discharge  Other Stroke Risk Factors Obesity, Body mass index is 39.03 kg/m.   Other Active Problems B cell lymphoma s/p chemo, now in remission  Hospital day # 1  This patient is critically ill due to stroke like symptoms status post TNK and at significant risk of neurological worsening, death form recurrent stroke, hemorrhagic transformation, bleeding from TNK. This patient's care requires constant monitoring of vital signs, hemodynamics, respiratory and cardiac monitoring, review of multiple databases, neurological assessment, discussion with family, other specialists and medical decision making of high complexity. I spent 35 minutes of neurocritical care time in the care of this patient. I had long discussion with patient and wife at bedside, updated pt current condition, treatment plan and potential prognosis, and answered all the questions.  They may expressed understanding and appreciation.    Rosalin Hawking, MD PhD Stroke Neurology 12/02/2021 6:11 PM    To contact Stroke Continuity provider, please refer to [email or web address] After hours, contact General Neurology

## 2021-12-02 NOTE — Evaluation (Deleted)
Clinical/Bedside Swallow Evaluation Patient Details  Name: James Jennings MRN: [id number] Date of Birth: 29-Jul-1970  Today's Date: 12/02/2021 Time: SLP Start Time (ACUTE ONLY): 0903 SLP Stop Time (ACUTE ONLY): 0932 SLP Time Calculation (min) (ACUTE ONLY): 29 min  Past Medical History:  Past Medical History:  Diagnosis Date   Anxiety    Bilateral swelling of feet    Diabetes mellitus without complication ([name])    High cholesterol    Joint pain    Lactose intolerance    Large cell, follicular non-Hodgkin's lymphoma ([place])    Lymphoma, follicular ([place]) dx'd 16/0109   Multiple food allergies    Other fatigue    PONV (postoperative nausea and vomiting)    Pre-diabetes    Prediabetes    Shortness of breath on exertion    Sleep apnea    "dx'd ~ 2008; never NA'T mask" (02/28/2016)   Past Surgical History:  Past Surgical History:  Procedure Laterality Date   ACHILLES TENDON SURGERY Left ~ 2012   APPENDECTOMY  11/12/2015   lap appy   CLUB FOOT RELEASE Bilateral 1971   HERNIA REPAIR     INGUINAL LYMPH NODE BIOPSY Right 01/20/2016   Procedure: EXCISIONAL BIOPSY OF RIGHT INGUINAL LYMPH NODE;  Surgeon: Greer Pickerel, MD;  Location: [facility];  Service: General;  Laterality: Right;   IR IMAGING GUIDED PORT INSERTION  11/02/2019   LAPAROSCOPIC APPENDECTOMY N/A 11/12/2015   Procedure: APPENDECTOMY LAPAROSCOPIC;  Surgeon: Greer Pickerel, MD;  Location: [facility];  Service: General;  Laterality: N/A;   LYMPH NODE BIOPSY Left 03/20/2020   Procedure: EXCISIONAL BIOPSY LEFT INGUINAL LYMPH NODE;  Surgeon: Coralie Keens, MD;  Location: [facility];  Service: General;  Laterality: Left;  LMA   SUTURE REMOVAL Left ~ 2016-2017 X 3   "had to use permanent sutures w/my achilles OR; my body rejects them at times & I have to have them surgically removed; under anesthesia"   VASECTOMY Bilateral 07/2019   VENTRAL HERNIA REPAIR  1994   HPI:  Pt is a 51 y.o. male with a PMH of B-cell lymphoma in remission, diabetes,  hypertension, sleep apnea, who presented to [facility] long with complaints of visual changes, headache and confusion. CT negative for acute findings, MRI head ordered.    Assessment / Plan / Recommendation  Clinical Impression    Pt presents with mild expressive aphasia marked by word finding difficulty and mild alexia. Pt reports he is currently experiencing mild word finding and reading difficulty. Pt was alert throughout encounter and oriented to place, date, situation, and person. Oral motor assessment was grossly WFL. The [facility] Mental Status assessment was administered to further assess cognitive-linguistic functioning, the pt received a score of 24/30. The pt experienced minor difficulty with divergent naming, naming 14 animals within a minute. Additionally, the pt was unable to independently complete a delayed recall task involving objects, however provided semantic cues, he recalled 5/5. No issues in problem solving, auditory comprehension, orientation, or attention noted. To further assess reading, the pt orally read bullets from a list of functional techniques. The pt showed mild difficulty decoding more complex words, such as "regrouping" and "mnemonic." Phonetic cues aided pt in decoding the word. The pt performed a written task where he was asked to write a paragraph. The pt completed the task without issues in grammar, spelling or topic maintenance. Breifly discussed strategies to facilitate anomia and memory. 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   SLP Visit Diagnosis: Aphasia (R47.01)    Aspiration Risk       Diet Recommendation          Other  Recommendations      Recommendations for follow up therapy are one component of a multi-disciplinary discharge planning process, led by the attending physician.  Recommendations may be updated based on patient status, additional functional  criteria and insurance authorization.  Follow up Recommendations Outpatient SLP      Assistance Recommended at Discharge None  Functional Status Assessment Patient has had a recent decline in their functional status and demonstrates the ability to make significant improvements in function in a reasonable and predictable amount of time.  Frequency and Duration min 2x/week          Prognosis        Swallow Study   General HPI: Pt is a 51 y.o. male with a PMH of B-cell lymphoma in remission, diabetes, hypertension, sleep apnea, who presented to [facility] long with complaints of visual changes, headache and confusion. CT negative for acute findings, MRI head ordered.    Oral/Motor/Sensory Function Overall Oral Motor/Sensory Function: Within functional limits   Ice Chips     Thin Liquid      Nectar Thick     Honey Thick     Puree     Solid           Susann Givens Student SLP  12/02/2021,11:06 AM

## 2021-12-02 NOTE — Progress Notes (Signed)
EEG complete - results pending 

## 2021-12-02 NOTE — Progress Notes (Incomplete)
STROKE TEAM PROGRESS NOTE   INTERVAL HISTORY His *** is at the bedside.  ***  Vitals:   12/02/21 0430 12/02/21 0500 12/02/21 0600 12/02/21 0810  BP: 120/79 129/82 123/69   Pulse: 61 100 63   Resp: 13 (!) 23 14   Temp:    98 F (36.7 C)  TempSrc:    Oral  SpO2: 94% 93% 97%   Weight:      Height:       CBC:  Recent Labs  Lab 12/01/21 1720 12/02/21 0423  WBC 7.5 5.5  NEUTROABS 4.6  --   HGB 13.9 12.6*  HCT 40.2 36.0*  MCV 93.7 91.1  PLT 230 322   Basic Metabolic Panel:  Recent Labs  Lab 12/01/21 1720 12/02/21 0423  NA 139 141  K 3.4* 4.2  CL 108 108  CO2 23 23  GLUCOSE 125* 117*  BUN 13 9  CREATININE 1.16 1.07  CALCIUM 9.1 8.6*   Lipid Panel:  Recent Labs  Lab 12/02/21 0423  CHOL 132  TRIG 92  HDL 37*  CHOLHDL 3.6  VLDL 18  LDLCALC 77   HgbA1c:  Recent Labs  Lab 12/02/21 0423  HGBA1C 6.4*   Urine Drug Screen:  Recent Labs  Lab 12/01/21 1940  LABOPIA NONE DETECTED  COCAINSCRNUR NONE DETECTED  LABBENZ NONE DETECTED  AMPHETMU NONE DETECTED  THCU NONE DETECTED  LABBARB NONE DETECTED    Alcohol Level  Recent Labs  Lab 12/01/21 1704  ETH <10    IMAGING past 24 hours CT ANGIO HEAD NECK W WO CM (CODE STROKE)  Result Date: 12/01/2021 CLINICAL DATA:  Vision, dizziness, headache. EXAM: CT ANGIOGRAPHY HEAD AND NECK TECHNIQUE: Multidetector CT imaging of the head and neck was performed using the standard protocol during bolus administration of intravenous contrast. Multiplanar CT image reconstructions and MIPs were obtained to evaluate the vascular anatomy. Carotid stenosis measurements (when applicable) are obtained utilizing NASCET criteria, using the distal internal carotid diameter as the denominator. RADIATION DOSE REDUCTION: This exam was performed according to the departmental dose-optimization program which includes automated exposure control, adjustment of the mA and/or kV according to patient size and/or use of iterative reconstruction  technique. CONTRAST:  54m OMNIPAQUE IOHEXOL 350 MG/ML SOLN COMPARISON:  Same-day noncontrast CT head, CT neck 02/27/2016 FINDINGS: CTA NECK FINDINGS Aortic arch: The imaged aortic arch is normal. The origins of the major branch vessels are patent. The subclavian arteries are patent to the level imaged. Right carotid system: The right common, internal, and external carotid arteries are patent, without hemodynamically significant stenosis or occlusion. There is no dissection or aneurysm. Left carotid system: The left common, internal, and external carotid arteries are patent, with minimal plaque at the bifurcation but without hemodynamically significant stenosis or occlusion. There is no dissection or aneurysm. Vertebral arteries: Vertebral arteries are patent, without hemodynamically significant stenosis or occlusion. There is no dissection or aneurysm. Skeleton: There is no acute osseous abnormality or suspicious osseous lesion. There is mild degenerative change C5-C6. There is no visible canal hematoma. Other neck: A right chest wall port is noted with the tip off the field of view. The soft tissues of the neck are unremarkable. There is no pathologic lymphadenopathy in the neck. Upper chest: There is right worse than left apical scarring/bullous change. The imaged lung apices are otherwise clear. Review of the MIP images confirms the above findings CTA HEAD FINDINGS Anterior circulation: There is mild calcified plaque in the intracranial ICAs without hemodynamically significant stenosis  or occlusion. The bilateral MCAs are patent, without proximal stenosis or occlusion. The bilateral ACAs are patent, without proximal stenosis or occlusion. There is no aneurysm or AVM. Posterior circulation: The bilateral V4 segments are patent. The basilar artery is patent. The major cerebellar arteries appear patent. The bilateral PCAs are patent, without proximal stenosis or occlusion. There is no aneurysm or AVM. Venous  sinuses: Patent. Anatomic variants: None. Review of the MIP images confirms the above findings IMPRESSION: Patent vasculature of the head and neck with no hemodynamically significant stenosis or occlusion. Electronically Signed   By: Valetta Mole M.D.   On: 12/01/2021 18:11   CT HEAD CODE STROKE WO CONTRAST  Result Date: 12/01/2021 CLINICAL DATA:  Code stroke. Tunnel vision, dizziness, headache, unsteady gait, aphasia. EXAM: CT HEAD WITHOUT CONTRAST TECHNIQUE: Contiguous axial images were obtained from the base of the skull through the vertex without intravenous contrast. RADIATION DOSE REDUCTION: This exam was performed according to the departmental dose-optimization program which includes automated exposure control, adjustment of the mA and/or kV according to patient size and/or use of iterative reconstruction technique. COMPARISON:  None Available. FINDINGS: Brain: There is no acute intracranial hemorrhage, extra-axial fluid collection, or acute infarct. Parenchymal volume is normal. The ventricles are normal in size. Gray-white differentiation is preserved There is no mass lesion.  There is no mass effect or midline shift. Vascular: No hyperdense vessel or unexpected calcification. Skull: Normal. Negative for fracture or focal lesion. Sinuses/Orbits: The imaged paranasal sinuses are clear. The globes and orbits are unremarkable. Other: None. ASPECTS [facility] Stroke Program Early CT Score) - Ganglionic level infarction (caudate, lentiform nuclei, internal capsule, insula, M1-M3 cortex): 7 - Supraganglionic infarction (M4-M6 cortex): 3 Total score (0-10 with 10 being normal): 10 IMPRESSION: 1. No acute intracranial pathology. 2. ASPECTS is 10 Findings were discussed over the telephone with Dr. Alvino Chapel at 5:15 p.m. Electronically Signed   By: Valetta Mole M.D.   On: 12/01/2021 17:20    PHYSICAL EXAM *** General: Awake alert in no distress HEENT: Normocephalic atraumatic CVS: Regular rhythm Respiratory:  Breathing well saturating normally on room air Abdomen nondistended nontender Extremities warm well perfused without edema Neurological exam Awake alert oriented x3 No dysarthria No evidence of aphasia on examination with the stroke scale cards but does report some word finding difficulty off-and-on which I was not able to elicit at the time of my encounter. Cranial nerves II to XII intact Motor examination with no drift Sensation intact to light touch Coordination with no dysmetria   ASSESSMENT/PLAN 51 year old with history of B-cell lymphoma in remission, diabetes, hypertension presented to ED for code stroke.  Per patient and the wife, he was last seen normal 4 PM at that time patient was driving to attend son's parents-teacher conference.  Wife was called shortly after that he was not feeling well, he stopped in the parking lot and has vision changes not able to see in update on the right side, feeling of and confused.  Wife drove to the parking lot and found him tachycardia with heart rate 135, not thinking right and symptoms seems getting worse.  Also complaining of headache on the left back of head.  EMS was called and sent to ER for evaluation.  Glucose 131, BP 145/91.  Denies numbness, weakness or loss of consciousness.   Per wife, patient had B-cell lymphoma, was treated by chemotherapy in the past but recently in remission.  Diabetes controlled good at home.  No clear history of migraine, not frequent  headache at home.  Stroke***TIA:  {gen right left:310021} *** Etiology:  ***  Code Stroke CT head 1. No acute intracranial pathology. 2. ASPECTS is 10 CTA head & neck Patent vasculature of the head and neck with no hemodynamically significant stenosis or occlusion. MRI  pending 2D Echo pending LDL 77 HgbA1c 6.4 VTE prophylaxis - SCD's    Diet   Diet heart healthy/carb modified Room service appropriate? Yes; Fluid consistency: Thin   No antithrombotic prior to admission, now on  No antithrombotic. S/p TNK Therapy recommendations:  TBD Disposition:  TBD  Hypertension Home meds:  none Stable Permissive hypertension (OK if < 220/120) but gradually normalize in 5-7 days Long-term BP goal normotensive  Hyperlipidemia Home meds:  lipitor 80 mg, resumed in hospital LDL 77, goal < 70 Continue statin at discharge  Diabetes type II Controlled Home meds:  semaglutide '1mg'$  per week, metformin '500mg'$  daily,farxiga '10mg'$  daily HgbA1c 6.4, goal < 7.0 CBGs Recent Labs    12/01/21 1657 12/01/21 2042  GLUCAP 131* 108*     Other Stroke Risk Factors  Obesity, Body mass index is 39.03 kg/m., BMI >/= 30 associated with increased stroke risk, recommend weight loss, diet and exercise as appropriate   Other Active Problems B-cell lymphoma (in remission)  Hospital day # 1  Laurey Morale, MSN, NP-C [facility] See AMION or use Epic Chat   To contact Stroke Continuity provider, please refer to [email or web address] After hours, contact General Neurology

## 2021-12-02 NOTE — Progress Notes (Signed)
PT Cancellation Note  Patient Details Name: James Jennings MRN: [id number] DOB: 05/25/1970   Cancelled Treatment:    Reason Eval/Treat Not Completed: Active bedrest order   Sandy Salaam Nisa Decaire 12/02/2021, 7:36 AM [place] Office: [phone]

## 2021-12-02 NOTE — Progress Notes (Signed)
OT Cancellation Note  Patient Details Name: Zaydon Kinser MRN: [id number] DOB: [birth year]   Cancelled Treatment:    Reason Eval/Treat Not Completed: Active bedrest order- strict bedrest, will follow and see as able when medically appropriate.   Jolaine Artist, OT Acute Rehabilitation Services Office [phone]   Delight Stare 12/02/2021, 8:21 AM

## 2021-12-03 ENCOUNTER — Other Ambulatory Visit: Payer: Self-pay

## 2021-12-03 ENCOUNTER — Encounter (HOSPITAL_COMMUNITY): Payer: Self-pay | Admitting: Student in an Organized Health Care Education/Training Program

## 2021-12-03 DIAGNOSIS — E1159 Type 2 diabetes mellitus with other circulatory complications: Secondary | ICD-10-CM | POA: Diagnosis not present

## 2021-12-03 DIAGNOSIS — R4182 Altered mental status, unspecified: Secondary | ICD-10-CM

## 2021-12-03 DIAGNOSIS — E7800 Pure hypercholesterolemia, unspecified: Secondary | ICD-10-CM | POA: Diagnosis not present

## 2021-12-03 DIAGNOSIS — C8223 Follicular lymphoma grade III, unspecified, intra-abdominal lymph nodes: Secondary | ICD-10-CM

## 2021-12-03 DIAGNOSIS — I639 Cerebral infarction, unspecified: Secondary | ICD-10-CM | POA: Diagnosis not present

## 2021-12-03 DIAGNOSIS — I63432 Cerebral infarction due to embolism of left posterior cerebral artery: Secondary | ICD-10-CM

## 2021-12-03 LAB — ANTITHROMBIN III: AntiThromb III Func: 116 % (ref 75–120)

## 2021-12-03 NOTE — Plan of Care (Signed)
  Problem: Education: Goal: Knowledge of General Education information will improve Description: Including pain rating scale, medication(s)/side effects and non-pharmacologic comfort measures Outcome: Progressing   Problem: Health Behavior/Discharge Planning: Goal: Ability to manage health-related needs will improve Outcome: Progressing   Problem: Clinical Measurements: Goal: Ability to maintain clinical measurements within normal limits will improve Outcome: Progressing Goal: Will remain free from infection Outcome: Progressing Goal: Diagnostic test results will improve Outcome: Progressing Goal: Respiratory complications will improve Outcome: Progressing Goal: Cardiovascular complication will be avoided Outcome: Progressing   Problem: Activity: Goal: Risk for activity intolerance will decrease Outcome: Progressing   Problem: Nutrition: Goal: Adequate nutrition will be maintained Outcome: Progressing   Problem: Coping: Goal: Level of anxiety will decrease Outcome: Progressing   Problem: Elimination: Goal: Will not experience complications related to bowel motility Outcome: Progressing Goal: Will not experience complications related to urinary retention Outcome: Progressing   Problem: Pain Managment: Goal: General experience of comfort will improve Outcome: Progressing   Problem: Safety: Goal: Ability to remain free from injury will improve Outcome: Progressing   Problem: Skin Integrity: Goal: Risk for impaired skin integrity will decrease Outcome: Progressing   Problem: Education: Goal: Knowledge of disease or condition will improve Outcome: Progressing Goal: Knowledge of secondary prevention will improve (MUST DOCUMENT ALL) Outcome: Progressing Goal: Knowledge of patient specific risk factors will improve Elta [place] N/A or DELETE if not current risk factor) Outcome: Progressing   Problem: Ischemic Stroke/TIA Tissue Perfusion: Goal: Complications of ischemic  stroke/TIA will be minimized Outcome: Progressing   Problem: Coping: Goal: Will verbalize positive feelings about self Outcome: Progressing Goal: Will identify appropriate support needs Outcome: Progressing   Problem: Health Behavior/Discharge Planning: Goal: Ability to manage health-related needs will improve Outcome: Progressing Goal: Goals will be collaboratively established with patient/family Outcome: Progressing   Problem: Self-Care: Goal: Ability to participate in self-care as condition permits will improve Outcome: Progressing Goal: Verbalization of feelings and concerns over difficulty with self-care will improve Outcome: Progressing Goal: Ability to communicate needs accurately will improve Outcome: Progressing   Problem: Nutrition: Goal: Risk of aspiration will decrease Outcome: Progressing Goal: Dietary intake will improve Outcome: Progressing

## 2021-12-03 NOTE — Progress Notes (Signed)
Speech Language Pathology Treatment: Cognitive-Linquistic  Patient Details Name: James Jennings MRN: [id number] DOB: [birth year] Today's Date: 12/03/2021 Time: 1209-1225 SLP Time Calculation (min) (ACUTE ONLY): 16 min  Assessment / Plan / Recommendation Clinical Impression  Pt was seen for treatment with his wife present. Pt reported some improvement in verbal decoding, reading comprehension, and word retrieval since his evaluation, but he stated that difficulty with memory persists. Pt demonstrated recall of concrete information from recorded voice mails with 90% accuracy increasing to 100% with choice cues. He stated that he has been attempting to Google the description of words which he has difficulty retrieving, and to visualize the spelling of words. Pt and his wife were educated the nature of pt's deficits, and the potential impact of these on his return to work was discussed. Pt stated that he believes that work tasks will likely take him a bit longer, but he believes that he will be able to complete them. Both parties were educated regarding strategies to improve word retrieval; they verbalized understanding as well as agreement with plan for outpatient SLP services after discharge. SLP will continue to follow pt.     HPI HPI: Pt is a 51 y.o. male with a PMH of B-cell lymphoma in remission, diabetes, hypertension, sleep apnea, who presented to [facility] long with complaints of visual changes, headache and confusion. CT negative for acute findings, MRI brain: Acute infarct involving left occipital lobe cortex and hippocampal  tail in the PCA distribution.      SLP Plan  Continue with current plan of care      Recommendations for follow up therapy are one component of a multi-disciplinary discharge planning process, led by the attending physician.  Recommendations may be updated based on patient status, additional functional criteria and insurance authorization.    Recommendations                    Follow Up Recommendations: Outpatient SLP Assistance recommended at discharge: None SLP Visit Diagnosis: Aphasia (R47.01) Plan: Continue with current plan of care         James Jennings I. Hardin Negus, [place], [place] Office number [phone]  Horton Marshall  12/03/2021, 2:04 PM

## 2021-12-03 NOTE — Progress Notes (Signed)
    [facility] has been requested to perform a transesophageal echocardiogram on 12/04/2021 following CVA.  This will be performed by Dr. Harrell Gave. After careful review of history and examination, the risks and benefits of transesophageal echocardiogram have been explained including risks of esophageal damage, perforation (1:10,000 risk), bleeding, pharyngeal hematoma as well as other potential complications associated with conscious sedation including aspiration, arrhythmia, respiratory failure and death. Alternatives to treatment were discussed, questions were answered. Patient is willing to proceed.   Lily Kocher PA-C 12/03/2021 1:21 PM

## 2021-12-03 NOTE — Evaluation (Signed)
Occupational Therapy Evaluation Patient Details Name: Jaycub Noorani MRN: [id number] DOB: 06/01/1970 Today's Date: 12/03/2021   History of Present Illness 51 y.o. male admitted for HA, vision change, confusion and word finding difficulty. TNK given. MRI + CVA L occipital lobe and hippocampal tail in PCA distribution. History of HTN, DM, B cell lymphoma on remission   Clinical Impression   PTA pt lives independently with his wife and works as an Passenger transport manager. States vision has improved, however it its still "fuzzy" in R field. Continues to have mild difficulty with reading "long/unfamiliar words". Reviewed signs/symptoms of CVA using Be[employer]. Recommend pt have a Full Fiield Visual Assessment completed prior to return to driving. Discussed need for routine eye care given his hx of DM.Pt verbalized understanding.  At this time do not recommend OT follow up, however if pt has difficulty returning to work duties, he can discus a referral to outpt OT with his MD at that time. OT signing off.      Recommendations for follow up therapy are one component of a multi-disciplinary discharge planning process, led by the attending physician.  Recommendations may be updated based on patient status, additional functional criteria and insurance authorization.   Follow Up Recommendations  Other (comment) (Follow up with his eye doctor for a Full Field Visual Assessment)    Assistance Recommended at Discharge Set up Supervision/Assistance  Patient can return home with the following Assist for transportation    Functional Status Assessment  Patient has had a recent decline in their functional status and demonstrates the ability to make significant improvements in function in a reasonable and predictable amount of time.  Equipment Recommendations  None recommended by OT    Recommendations for Other Services       Precautions / Restrictions Precautions Precautions: None      Mobility Bed Mobility Overal bed  mobility: Independent                  Transfers Overall transfer level: Independent Equipment used: None                      Balance Overall balance assessment: Independent                                         ADL either performed or assessed with clinical judgement   ADL Overall ADL's : At baseline                                             Vision Baseline Vision/History: 1 Wears glasses Patient Visual Report: Blurring of vision (R visual field) Vision Assessment?: Yes Eye Alignment: Within Functional Limits Ocular Range of Motion: Within Functional Limits Alignment/Gaze Preference: Within Defined Limits Tracking/Visual Pursuits: Able to track stimulus in all quads without difficulty Saccades: Within functional limits Convergence: Within functional limits Visual Fields: Other (comment) ("fuzzy" in R field; identifies all targets, howwever slower with confrontation testing with number of fingers)     Perception Perception Comments: [facility]   Praxis      Pertinent Vitals/Pain Pain Assessment Pain Assessment: No/denies pain     Hand Dominance Right   Extremity/Trunk Assessment Upper Extremity Assessment Upper Extremity Assessment: Overall WFL for tasks assessed   Lower Extremity Assessment Lower Extremity Assessment: Overall  WFL for tasks assessed   Cervical / Trunk Assessment Cervical / Trunk Assessment: Normal   Communication Communication Communication: Expressive difficulties (pt reports mild word-finding and comprehension issues, none noted during session)   Cognition Arousal/Alertness: Awake/alert Behavior During Therapy: WFL for tasks assessed/performed Overall Cognitive Status: Within Functional Limits for tasks assessed                                 General Comments: did notice him repeating himself a couple of times during session; apparently had difficulty with recall - states he  has baseline STM problems     General Comments       Exercises     Shoulder Instructions      Home Living Family/patient expects to be discharged to:: Private residence Living Arrangements: Spouse/significant other;Children Available Help at Discharge: Family Type of Home: House Home Access: Level entry     Home Layout: Two level;Bed/bath upstairs Alternate Level Stairs-Number of Steps: flight Alternate Level Stairs-Rails: Left Bathroom Shower/Tub: Teacher, early years/pre: Standard     Home Equipment: None          Prior Functioning/Environment Prior Level of Function : Independent/Modified Independent               ADLs Comments: works as a Passenger transport manager from home        OT Problem List: Impaired vision/perception      OT Treatment/Interventions:      OT Goals(Current goals can be found in the care plan section) Acute Rehab OT Goals Patient Stated Goal: to not have another strokoe OT Goal Formulation: With patient  OT Frequency:      Co-evaluation              AM-PAC OT "6 Clicks" Daily Activity     Outcome Measure Help from another person eating meals?: None Help from another person taking care of personal grooming?: None Help from another person toileting, which includes using toliet, bedpan, or urinal?: None Help from another person bathing (including washing, rinsing, drying)?: None Help from another person to put on and taking off regular upper body clothing?: None Help from another person to put on and taking off regular lower body clothing?: None 6 Click Score: 24   End of Session Nurse Communication: Mobility status;Other (comment) (DC needs)  Activity Tolerance: Patient tolerated treatment well Patient left: in chair;with call bell/phone within reach  OT Visit Diagnosis: Low vision, both eyes (H54.2)                Time: 5520-8022 OT Time Calculation (min): 28 min Charges:  OT General Charges $OT Visit: 1 Visit OT  Evaluation $OT Eval Low Complexity: 1 Low OT Treatments $Self Care/Home Management : 8-22 mins  Maurie Boettcher, OT/L   Acute OT Clinical Specialist Acute Rehabilitation Services Pager [phone] Office [phone]   [facility] 12/03/2021, 10:13 AM

## 2021-12-03 NOTE — Progress Notes (Signed)
Patient c/o mini hot flash with head fuzziness. States seems to be resolving at this time. A and O x 4, no facial droop or extremity weakness. Vitals charted and MD paged.

## 2021-12-03 NOTE — TOC Initial Note (Signed)
Transition of Care [facility]) - Initial/Assessment Note    Patient Details  Name: James Jennings MRN: [id number] Date of Birth: 01/13/1971  Transition of Care [facility]) CM/SW Contact:    Pollie Friar, RN Phone Number: 12/03/2021, 2:57 PM  Clinical Narrative:                 Pt is from home with spouse. She is able to provide needed supervision and support.  Recommendations for outpatient ST. Pt will attend at [facility]. Information on the AVS.  No DME at home. Pt was driving self but spouse can provide needed transportation. Pt manages his own medications  and denies any issues. Plan is for TEE tomorrow.  Expected Discharge Plan: OP Rehab Barriers to Discharge: Continued Medical Work up   Patient Goals and CMS Choice     Choice offered to / list presented to : Patient  Expected Discharge Plan and Services Expected Discharge Plan: OP Rehab   Discharge Planning Services: CM Consult   Living arrangements for the past 2 months: Single Family Home                                      Prior Living Arrangements/Services Living arrangements for the past 2 months: Single Family Home Lives with:: Spouse Patient language and need for interpreter reviewed:: Yes Do you feel safe going back to the place where you live?: Yes      Need for Family Participation in Patient Care: Yes (Comment) Care giver support system in place?: Yes (comment)   Criminal Activity/Legal Involvement Pertinent to Current Situation/Hospitalization: No - Comment as needed  Activities of Daily Living Home Assistive Devices/Equipment: None ADL Screening (condition at time of admission) Patient's cognitive ability adequate to safely complete daily activities?: Yes Is the patient deaf or have difficulty hearing?: No Does the patient have difficulty seeing, even when wearing glasses/contacts?: No Does the patient have difficulty concentrating, remembering, or making decisions?: No Patient able to  express need for assistance with ADLs?: Yes Does the patient have difficulty dressing or bathing?: No Independently performs ADLs?: Yes (appropriate for developmental age) Does the patient have difficulty walking or climbing stairs?: No Weakness of Legs: None Weakness of Arms/Hands: None  Permission Sought/Granted                  Emotional Assessment Appearance:: Appears stated age Attitude/Demeanor/Rapport: Engaged Affect (typically observed): Accepting Orientation: : Oriented to Self, Oriented to Place, Oriented to  Time, Oriented to Situation   Psych Involvement: No (comment)  Admission diagnosis:  Stroke (cerebrum) ([name]) [I63.9] Cerebrovascular accident (CVA), unspecified mechanism ([place]) [I63.9] Acute ischemic stroke [facility]) [I63.9] Patient Active Problem List   Diagnosis Date Noted   Stroke (cerebrum) ([place]) 12/01/2021   Acute ischemic stroke ([place]) 12/01/2021   Palpitations 05/08/2021   Febrile illness    Septic shock ([place]) 38/93/7342   Follicular lymphoma grade III of intra-abdominal lymph nodes ([place]) 01/30/2016   Acute appendicitis with rupture 11/13/2015   Acute appendicitis 11/12/2015   Tobacco use 06/25/2011   ACHILLES BURSITIS OR TENDINITIS 10/28/2009   PCP:  [name], MD Pharmacy:   [id number] [address]: [phone]: [address]: [phone]: [phone] [facility]  [place] [place] [place] [address] Phone: [phone] Fax: [phone]     Social Determinants of Health (SDOH) Interventions Housing Interventions: Intervention Not Indicated  Readmission Risk Interventions     No data to display

## 2021-12-03 NOTE — Procedures (Addendum)
Patient Name: James Jennings  MRN: [id number]  Epilepsy Attending: Lora Havens  Referring Physician/Provider: Rosalin Hawking, MD  Date: 12/02/2021  Duration: 21.56 mins  Patient history:  51 y.o. male with history of HTN, DM, B cell lymphoma on remission admitted for HA, vision change, confusion and word finding difficulty. EEG to evaluate for seizure  Level of alertness: Awake  AEDs during EEG study: None  Technical aspects: This EEG study was done with scalp electrodes positioned according to the 10-20 International system of electrode placement. Electrical activity was reviewed with band pass filter of 1-'70Hz'$ , sensitivity of 7 uV/mm, display speed of 37m/sec with a '60Hz'$  notched filter applied as appropriate. EEG data were recorded continuously and digitally stored.  Video monitoring was available and reviewed as appropriate.  Description: The posterior dominant rhythm consists of 9 Hz activity of moderate voltage (25-35 uV) seen predominantly in posterior head regions, symmetric and reactive to eye opening and eye closing. Hyperventilation and photic stimulation were not performed.     IMPRESSION: This study is within normal limits. No seizures or epileptiform discharges were seen throughout the recording.  A normal interictal EEG does not exclude the diagnosis of epilepsy.   [name] [name]

## 2021-12-03 NOTE — Progress Notes (Addendum)
STROKE TEAM PROGRESS NOTE   SUBJECTIVE (INTERVAL HISTORY)  His family is at the bedside. He is walking around the room.Still having trouble with reading and fluency.  Recommending a TEE, scheduled for tomorrow. Will check hypercoagulable panel as well.  No new neurological events overnight. Neuro exam is stable.  VSS  OBJECTIVE Temp:  [97.8 F (36.6 C)-98.4 F (36.9 C)] 97.8 F (36.6 C) (11/01 0914) Pulse Rate:  [64-98] 94 (11/01 0914) Cardiac Rhythm: Normal sinus rhythm (11/01 0925) Resp:  [11-20] 20 (11/01 0914) BP: (85-149)/(52-95) 116/82 (11/01 0914) SpO2:  [91 %-97 %] 96 % (11/01 0914) Weight:  [123.1 kg] 123.1 kg (11/01 0315)  Recent Labs  Lab 12/01/21 1657 12/01/21 2042  GLUCAP 131* 108*    Recent Labs  Lab 12/01/21 1710 12/01/21 1720 12/02/21 0423  NA 140 139 141  K 3.4* 3.4* 4.2  CL 106 108 108  CO2  --  23 23  GLUCOSE 124* 125* 117*  BUN '12 13 9  '$ CREATININE 1.10 1.16 1.07  CALCIUM  --  9.1 8.6*    Recent Labs  Lab 12/01/21 1720 12/02/21 0423  AST 27 24  ALT 28 25  ALKPHOS 125 102  BILITOT 0.6 0.8  PROT 7.3 5.7*  ALBUMIN 4.1 3.3*    Recent Labs  Lab 12/01/21 1710 12/01/21 1720 12/02/21 0423  WBC  --  7.5 5.5  NEUTROABS  --  4.6  --   HGB 13.9 13.9 12.6*  HCT 41.0 40.2 36.0*  MCV  --  93.7 91.1  PLT  --  230 187    No results for input(s): "CKTOTAL", "CKMB", "CKMBINDEX", "TROPONINI" in the last 168 hours. Recent Labs    12/01/21 1720  LABPROT 12.5  INR 0.9    Recent Labs    12/01/21 1940  COLORURINE YELLOW  LABSPEC 1.039*  PHURINE 5.0  GLUCOSEU >=500*  HGBUR NEGATIVE  BILIRUBINUR NEGATIVE  KETONESUR NEGATIVE  PROTEINUR NEGATIVE  NITRITE NEGATIVE  LEUKOCYTESUR NEGATIVE        Component Value Date/Time   CHOL 132 12/02/2021 0423   CHOL 171 12/18/2020 0817   TRIG 92 12/02/2021 0423   HDL 37 (L) 12/02/2021 0423   HDL 42 12/18/2020 0817   CHOLHDL 3.6 12/02/2021 0423   VLDL 18 12/02/2021 0423   LDLCALC 77 12/02/2021  0423   LDLCALC 110 (H) 12/18/2020 0817   Lab Results  Component Value Date   HGBA1C 6.4 (H) 12/02/2021      Component Value Date/Time   LABOPIA NONE DETECTED 12/01/2021 1940   COCAINSCRNUR NONE DETECTED 12/01/2021 1940   LABBENZ NONE DETECTED 12/01/2021 1940   AMPHETMU NONE DETECTED 12/01/2021 1940   THCU NONE DETECTED 12/01/2021 1940   LABBARB NONE DETECTED 12/01/2021 1940    Recent Labs  Lab 12/01/21 1704  ETH <10     I have personally reviewed the radiological images below and agree with the radiology interpretations.  EEG adult  Result Date: 12/03/2021 Lora Havens, MD     12/03/2021  8:52 AM Patient Name: James Jennings MRN: [id number] Epilepsy Attending: Lora Havens Referring Physician/Provider: Rosalin Hawking, MD Date: 12/02/2021 Duration: 21.56 mins Patient history:  51 y.o. male with history of HTN, DM, B cell lymphoma on remission admitted for HA, vision change, confusion and word finding difficulty. EEG to evaluate for seizure Level of alertness: Awake AEDs during EEG study: None Technical aspects: This EEG study was done with scalp electrodes positioned according to the 10-20 International system of electrode placement.  Electrical activity was reviewed with band pass filter of 1-'70Hz'$ , sensitivity of 7 uV/mm, display speed of 67m/sec with a '60Hz'$  notched filter applied as appropriate. EEG data were recorded continuously and digitally stored.  Video monitoring was available and reviewed as appropriate. Description: The posterior dominant rhythm consists of 9 Hz activity of moderate voltage (25-35 uV) seen predominantly in posterior head regions, symmetric and reactive to eye opening and eye closing. Hyperventilation and photic stimulation were not performed.   IMPRESSION: This study is within normal limits. No seizures or epileptiform discharges were seen throughout the recording. A normal interictal EEG does not exclude the diagnosis of epilepsy. PLora Havens  MR BRAIN W  WO CONTRAST  Result Date: 12/02/2021 CLINICAL DATA:  Vision changes.  Unable to see on the right side. EXAM: MRI HEAD WITHOUT AND WITH CONTRAST TECHNIQUE: Multiplanar, multiecho pulse sequences of the brain and surrounding structures were obtained without and with intravenous contrast. CONTRAST:  175mGADAVIST GADOBUTROL 1 MMOL/ML IV SOLN COMPARISON:  Noncontrast head CT 1 day prior. FINDINGS: Brain: There is mild cortical diffusion restriction in the left occipital lobe (2-22) with faint FLAIR signal abnormality consistent with acute infarct in the PCA distribution. The tail of the hippocampus is also likely involved (2-27). There is no associated hemorrhage or mass effect There is no other evidence of acute infarct. There is no acute intracranial hemorrhage or extra-axial fluid collection. Background parenchymal volume is normal. The ventricles are normal in size. Gray-white differentiation is otherwise preserved. Parenchymal signal is otherwise normal, with no significant burden of underlying chronic small vessel ischemic change. There is no mass lesion or abnormal enhancement. There is no mass effect or midline shift. Vascular: Normal flow voids. Skull and upper cervical spine: Normal marrow signal. Sinuses/Orbits: The paranasal sinuses are clear. The globes and orbits are unremarkable. Other: None. IMPRESSION: Acute infarct involving left occipital lobe cortex and hippocampal tail in the PCA distribution without hemorrhage or mass effect. Electronically Signed   By: PeValetta Mole.D.   On: 12/02/2021 18:23   ECHOCARDIOGRAM COMPLETE  Result Date: 12/02/2021    ECHOCARDIOGRAM REPORT   Patient Name:   James Jennings  Date of Exam: 12/02/2021 Medical Rec #:  [id number]:       70.0 in Accession #:    [id number]:       272.0 lb Date of Birth:  1112-Aug-1972SA:          2.379 m Patient Age:    [age]   BP:           155/94 mmHg Patient Gender: M          HR:           88 bpm. Exam Location:   Inpatient Procedure: 2D Echo, Cardiac Doppler and Color Doppler Indications:    Stroke I63.9  History:        Patient has prior history of Echocardiogram examinations, most                 recent 04/01/2020. Risk Factors:Diabetes, Dyslipidemia and Sleep                 Apnea. History of cancer.  Sonographer:    TiDarlina SicilianDCS Referring Phys: [id number] Plains Surgery Center LLCSonographer Comments: Suboptimal apical window. IMPRESSIONS  1. Left ventricular ejection fraction, by estimation, is 60 to 65%. The left ventricle has normal function. The left ventricle has no regional wall motion abnormalities. Left ventricular diastolic  parameters were normal.  2. Right ventricular systolic function is normal. The right ventricular size is normal. Tricuspid regurgitation signal is inadequate for assessing PA pressure.  3. The mitral valve is grossly normal. No evidence of mitral valve regurgitation. No evidence of mitral stenosis.  4. The aortic valve was not well visualized. Aortic valve regurgitation is not visualized. No aortic stenosis is present.  5. The inferior vena cava is normal in size with greater than 50% respiratory variability, suggesting right atrial pressure of 3 mmHg. Comparison(s): Similar to prior. FINDINGS  Left Ventricle: Left ventricular ejection fraction, by estimation, is 60 to 65%. The left ventricle has normal function. The left ventricle has no regional wall motion abnormalities. The left ventricular internal cavity size was normal in size. There is  no left ventricular hypertrophy. Left ventricular diastolic parameters were normal. Right Ventricle: The right ventricular size is normal. Right vetricular wall thickness was not well visualized. Right ventricular systolic function is normal. Tricuspid regurgitation signal is inadequate for assessing PA pressure. Left Atrium: Left atrial size was normal in size. Right Atrium: Right atrial size was normal in size. Pericardium: There is no evidence of pericardial  effusion. Mitral Valve: The mitral valve is grossly normal. No evidence of mitral valve regurgitation. No evidence of mitral valve stenosis. Tricuspid Valve: The tricuspid valve is normal in structure. Tricuspid valve regurgitation is not demonstrated. No evidence of tricuspid stenosis. Aortic Valve: The aortic valve was not well visualized. Aortic valve regurgitation is not visualized. No aortic stenosis is present. Pulmonic Valve: The pulmonic valve was not well visualized. Pulmonic valve regurgitation is not visualized. No evidence of pulmonic stenosis. Aorta: The aortic root, ascending aorta and aortic arch are all structurally normal, with no evidence of dilitation or obstruction and the aortic root and ascending aorta are structurally normal, with no evidence of dilitation. Venous: The inferior vena cava is normal in size with greater than 50% respiratory variability, suggesting right atrial pressure of 3 mmHg. IAS/Shunts: No atrial level shunt detected by color flow Doppler.  LEFT VENTRICLE PLAX 2D LVIDd:         4.70 cm   Diastology LVIDs:         2.90 cm   LV e' medial:    10.50 cm/s LV PW:         1.00 cm   LV E/e' medial:  7.8 LV IVS:        1.10 cm   LV e' lateral:   14.10 cm/s LVOT diam:     2.30 cm   LV E/e' lateral: 5.8 LV SV:         57 LV SV Index:   24 LVOT Area:     4.15 cm  LEFT ATRIUM           Index LA diam:      3.70 cm 1.56 cm/m LA Vol (A4C): 16.8 ml 7.06 ml/m  AORTIC VALVE LVOT Vmax:   71.30 cm/s LVOT Vmean:  53.400 cm/s LVOT VTI:    0.136 m  AORTA Ao Root diam: 3.50 cm Ao Asc diam:  3.40 cm MITRAL VALVE MV Area (PHT): 6.71 cm    SHUNTS MV Decel Time: 113 msec    Systemic VTI:  0.14 m MV E velocity: 81.50 cm/s  Systemic Diam: 2.30 cm MV A velocity: 70.40 cm/s MV E/A ratio:  1.16 Rudean Haskell MD Electronically signed by Rudean Haskell MD Signature Date/Time: 12/02/2021/10:07:44 AM    Final    CT ANGIO HEAD  NECK W WO CM (CODE STROKE)  Result Date: 12/01/2021 CLINICAL  DATA:  Vision, dizziness, headache. EXAM: CT ANGIOGRAPHY HEAD AND NECK TECHNIQUE: Multidetector CT imaging of the head and neck was performed using the standard protocol during bolus administration of intravenous contrast. Multiplanar CT image reconstructions and MIPs were obtained to evaluate the vascular anatomy. Carotid stenosis measurements (when applicable) are obtained utilizing NASCET criteria, using the distal internal carotid diameter as the denominator. RADIATION DOSE REDUCTION: This exam was performed according to the departmental dose-optimization program which includes automated exposure control, adjustment of the mA and/or kV according to patient size and/or use of iterative reconstruction technique. CONTRAST:  39m OMNIPAQUE IOHEXOL 350 MG/ML SOLN COMPARISON:  Same-day noncontrast CT head, CT neck 02/27/2016 FINDINGS: CTA NECK FINDINGS Aortic arch: The imaged aortic arch is normal. The origins of the major branch vessels are patent. The subclavian arteries are patent to the level imaged. Right carotid system: The right common, internal, and external carotid arteries are patent, without hemodynamically significant stenosis or occlusion. There is no dissection or aneurysm. Left carotid system: The left common, internal, and external carotid arteries are patent, with minimal plaque at the bifurcation but without hemodynamically significant stenosis or occlusion. There is no dissection or aneurysm. Vertebral arteries: Vertebral arteries are patent, without hemodynamically significant stenosis or occlusion. There is no dissection or aneurysm. Skeleton: There is no acute osseous abnormality or suspicious osseous lesion. There is mild degenerative change C5-C6. There is no visible canal hematoma. Other neck: A right chest wall port is noted with the tip off the field of view. The soft tissues of the neck are unremarkable. There is no pathologic lymphadenopathy in the neck. Upper chest: There is right worse  than left apical scarring/bullous change. The imaged lung apices are otherwise clear. Review of the MIP images confirms the above findings CTA HEAD FINDINGS Anterior circulation: There is mild calcified plaque in the intracranial ICAs without hemodynamically significant stenosis or occlusion. The bilateral MCAs are patent, without proximal stenosis or occlusion. The bilateral ACAs are patent, without proximal stenosis or occlusion. There is no aneurysm or AVM. Posterior circulation: The bilateral V4 segments are patent. The basilar artery is patent. The major cerebellar arteries appear patent. The bilateral PCAs are patent, without proximal stenosis or occlusion. There is no aneurysm or AVM. Venous sinuses: Patent. Anatomic variants: None. Review of the MIP images confirms the above findings IMPRESSION: Patent vasculature of the head and neck with no hemodynamically significant stenosis or occlusion. Electronically Signed   By: PValetta MoleM.D.   On: 12/01/2021 18:11   CT HEAD CODE STROKE WO CONTRAST  Result Date: 12/01/2021 CLINICAL DATA:  Code stroke. Tunnel vision, dizziness, headache, unsteady gait, aphasia. EXAM: CT HEAD WITHOUT CONTRAST TECHNIQUE: Contiguous axial images were obtained from the base of the skull through the vertex without intravenous contrast. RADIATION DOSE REDUCTION: This exam was performed according to the departmental dose-optimization program which includes automated exposure control, adjustment of the mA and/or kV according to patient size and/or use of iterative reconstruction technique. COMPARISON:  None Available. FINDINGS: Brain: There is no acute intracranial hemorrhage, extra-axial fluid collection, or acute infarct. Parenchymal volume is normal. The ventricles are normal in size. Gray-white differentiation is preserved There is no mass lesion.  There is no mass effect or midline shift. Vascular: No hyperdense vessel or unexpected calcification. Skull: Normal. Negative for  fracture or focal lesion. Sinuses/Orbits: The imaged paranasal sinuses are clear. The globes and orbits are unremarkable. Other: None. ASPECTS (  [place] Stroke Program Early CT Score) - Ganglionic level infarction (caudate, lentiform nuclei, internal capsule, insula, M1-M3 cortex): 7 - Supraganglionic infarction (M4-M6 cortex): 3 Total score (0-10 with 10 being normal): 10 IMPRESSION: 1. No acute intracranial pathology. 2. ASPECTS is 10 Findings were discussed over the telephone with Dr. Alvino Chapel at 5:15 p.m. Electronically Signed   By: Valetta Mole M.D.   On: 12/01/2021 17:20     PHYSICAL EXAM  Temp:  [97.8 F (36.6 C)-98.4 F (36.9 C)] 97.8 F (36.6 C) (11/01 0914) Pulse Rate:  [64-98] 94 (11/01 0914) Resp:  [11-20] 20 (11/01 0914) BP: (85-149)/(52-95) 116/82 (11/01 0914) SpO2:  [91 %-97 %] 96 % (11/01 0914) Weight:  [123.1 kg] 123.1 kg (11/01 0315)  General - Well nourished, well developed, in no apparent distress. Cardiovascular - Regular rhythm and rate.  Mental Status -  Level of arousal and orientation to time, place, and person were intact. Language including expression, naming, repetition, comprehension was assessed and found intact. Fund of Knowledge was assessed and was intact.  Cranial Nerves II - XII - II - Visual field intact OU. III, IV, VI - Extraocular movements intact. V - Facial sensation intact bilaterally. VII - Facial movement intact bilaterally. VIII - Hearing & vestibular intact bilaterally. X - Palate elevates symmetrically. XI - Chin turning & shoulder shrug intact bilaterally. XII - Tongue protrusion intact.  Motor Strength - The patient's strength was normal in all extremities and pronator drift was absent.  Bulk was normal and fasciculations were absent.   Motor Tone - Muscle tone was assessed at the neck and appendages and was normal. Sensory - Light touch, temperature/pinprick were assessed and were symmetrical.    Coordination - The patient had  normal movements in the hands and feet with no ataxia or dysmetria.  Tremor was absent.  Gait and Station - deferred.   ASSESSMENT/PLAN Mr. Jahmal Dunavant is a 51 y.o. male with history of HTN, DM, B cell lymphoma on remission admitted for HA, vision change, confusion and word finding difficulty. TNK given.    Acute left occipital lobe and hippocampal tail ischemic Stroke, etiology unclear CT no acute finding CTA head and neck unremarkable MRI  Acute infarct involving left occipital lobe cortex and hippocampal tail in the PCA distribution without hemorrhage or mass effect 2D Echo  EF 60-65% LE venous doppler pending LDL 77 HgbA1c 6.4 Hyper coag panel pending  TEE scheduled for tomorrow SCDs for VTE prophylaxis No antithrombotic prior to admission, now on ASA and Plavix. Patient counseled to be compliant with his antithrombotic medications Ongoing aggressive stroke risk factor management Therapy recommendations:  None Disposition:  pending  Diabetes Home meds: faxiga and metformin HgbA1c 6.4 goal < 7.0 Controlled Currently on faxiga and metformin CBG monitoring SSI DM education and close PCP follow up  Hypertension Stable Long term BP goal normotensive  Hyperlipidemia Home meds:  lipitor 80  LDL 77, goal < 70 Now on lipitor 80 Continue statin at discharge  Other Stroke Risk Factors Obesity, Body mass index is 38.94 kg/m.   Other Active Problems B cell lymphoma s/p chemo, now in remission  Beulah Gandy DNP, ACNPC-AG   ATTENDING NOTE: I reviewed above note and agree with the assessment and plan. Pt was seen and examined.   Wife at bedside.  Patient doing well at baseline.  Reviewed neuroimaging with both of them.  MRI showed left PCA scattered infarcts.  Etiology unclear.  Will do further stroke work-up including hypercoagulable panel, TEE  and LE venous Doppler.  Continue DAPT and statin.  PT/OT no recommendation.  For detailed assessment and plan, please refer to  above/below as I have made changes wherever appropriate.   Rosalin Hawking, MD PhD Stroke Neurology 12/03/2021 7:49 PM    To contact Stroke Continuity provider, please refer to [email or web address] After hours, contact General Neurology

## 2021-12-03 NOTE — Consult Note (Signed)
Referral MD  Reason for Referral: Acute embolic stroke-left occipital lobe and hippocampus; follicular large cell non-Hodgkin lymphoma  Chief Complaint  Patient presents with   Dizziness   Code Stroke  : I had visual difficulties and difficulty talking.  HPI: James Jennings is well-known to me.  He is a 51 year old white male.  He has been treated for relapse of the follicular non-Hodgkin's lymphoma.  He is done incredibly well.  He had systemic chemotherapy for this.  He has not had chemotherapy now for over a year.  We treated him with Gazyva-CHOP.  He now is on maintenance Gazyva.  His last treatment was in September.  He without a Primary school teacher on Monday.  He had gotten back from this.  He got in the car and has some visual difficulties.  Thankfully, he was able to call his wife.  She was able to get him to the hospital quickly.  He was subsequently underwent thrombolytic therapy.  He did have an MRI of the brain.  This did show a embolic infarct in the left occipital lobe and the tail hippocampus.  Thankfully, there is no evidence of any lymphoma.  Yesterday, his labs showed a white count of 5.5.  Hemoglobin 12.6.  Platelet count 187,000.  His lipid panel really is not all that bad.  His cholesterol was 132.  His cholesterol/HDL ratio was only 3.6.  His calcium is 8.6 with an albumin of 3.3.  His blood sugar was 117.  He currently is on baby aspirin and Plavix.  I think is also on Lipitor.  He sounds quite good.  He feels good.  He might be able to go home today.  He has had no change in bowel or bladder habits.  He has had no weakness on one side or the other.  He has had no issues swallowing.  He is able to see better.  Still thinks that there might be a little bit of difficulty with word finding.  Overall, I would say his performance status is probably ECOG 1.    Past Medical History:  Diagnosis Date   Anxiety    Bilateral swelling of feet    Diabetes mellitus  without complication ([name])    High cholesterol    Joint pain    Lactose intolerance    Large cell, follicular non-Hodgkin's lymphoma ([name])    Lymphoma, follicular ([place]) dx'd 00/9233   Multiple food allergies    Other fatigue    PONV (postoperative nausea and vomiting)    Pre-diabetes    Prediabetes    Shortness of breath on exertion    Sleep apnea    "dx'd ~ 2008; never RX'd mask" (02/28/2016)  :   Past Surgical History:  Procedure Laterality Date   ACHILLES TENDON SURGERY Left ~ 2012   APPENDECTOMY  11/12/2015   lap appy   CLUB FOOT RELEASE Bilateral 1971   HERNIA REPAIR     INGUINAL LYMPH NODE BIOPSY Right 01/20/2016   Procedure: EXCISIONAL BIOPSY OF RIGHT INGUINAL LYMPH NODE;  Surgeon: Greer Pickerel, MD;  Location: [facility];  Service: General;  Laterality: Right;   IR IMAGING GUIDED PORT INSERTION  11/02/2019   LAPAROSCOPIC APPENDECTOMY N/A 11/12/2015   Procedure: APPENDECTOMY LAPAROSCOPIC;  Surgeon: Greer Pickerel, MD;  Location: [facility];  Service: General;  Laterality: N/A;   LYMPH NODE BIOPSY Left 03/20/2020   Procedure: EXCISIONAL BIOPSY LEFT INGUINAL LYMPH NODE;  Surgeon: Coralie Keens, MD;  Location: [facility];  Service: General;  Laterality:  Left;  LMA   SUTURE REMOVAL Left ~ 2016-2017 X 3   "had to use permanent sutures w/my achilles OR; my body rejects them at times & I have to have them surgically removed; under anesthesia"   VASECTOMY Bilateral 07/2019   VENTRAL HERNIA REPAIR  1994  :   Current Facility-Administered Medications:    acetaminophen (TYLENOL) tablet 650 mg, 650 mg, Oral, Q4H PRN, 650 mg at 12/02/21 2250 **OR** acetaminophen (TYLENOL) 160 MG/5ML solution 650 mg, 650 mg, Per Tube, Q4H PRN **OR** acetaminophen (TYLENOL) suppository 650 mg, 650 mg, Rectal, Q4H PRN, Rosalin Hawking, MD   aspirin EC tablet 81 mg, 81 mg, Oral, Daily, Rosalin Hawking, MD, 81 mg at 12/02/21 2042   atorvastatin (LIPITOR) tablet 80 mg, 80 mg, Oral, Daily, Rosalin Hawking, MD, 80 mg at 12/02/21 2042    Chlorhexidine Gluconate Cloth 2 % PADS 6 each, 6 each, Topical, Daily, Amie Portland, MD, 6 each at 12/02/21 2017   clopidogrel (PLAVIX) tablet 75 mg, 75 mg, Oral, Daily, Rosalin Hawking, MD, 75 mg at 12/02/21 2043   dapagliflozin propanediol (FARXIGA) tablet 10 mg, 10 mg, Oral, Daily, Rosalin Hawking, MD, 10 mg at 12/02/21 2043   metFORMIN (GLUCOPHAGE) tablet 500 mg, 500 mg, Oral, Daily, Rosalin Hawking, MD, 500 mg at 12/02/21 2043   Oral care mouth rinse, 15 mL, Mouth Rinse, PRN, Amie Portland, MD   pantoprazole (PROTONIX) EC tablet 40 mg, 40 mg, Oral, QHS, Amie Portland, MD, 40 mg at 12/02/21 2043   senna-docusate (Senokot-S) tablet 1 tablet, 1 tablet, Oral, QHS PRN, Rosalin Hawking, MD:   aspirin EC  81 mg Oral Daily   atorvastatin  80 mg Oral Daily   Chlorhexidine Gluconate Cloth  6 each Topical Daily   clopidogrel  75 mg Oral Daily   dapagliflozin propanediol  10 mg Oral Daily   metFORMIN  500 mg Oral Daily   pantoprazole  40 mg Oral QHS  :   Allergies  Allergen Reactions   Mango Flavor Swelling and Other (See Comments)    LIPS SWELL   Rituximab Other (See Comments)    Blood pressure went extremely low.    Shellfish Allergy Anaphylaxis   Lactose Intolerance (Gi) Diarrhea  :   Family History  Problem Relation Age of Onset   Colon cancer Neg Hx    Colon polyps Neg Hx    Esophageal cancer Neg Hx    Stomach cancer Neg Hx    Rectal cancer Neg Hx   :   Social History   Socioeconomic History   Marital status: Married    Spouse name: James Jennings   Number of children: 1   Years of education: Not on file   Highest education level: Not on file  Occupational History   Occupation: auditor  Tobacco Use   Smoking status: Former    Packs/day: 1.00    Years: 30.00    Total pack years: 30.00    Types: Cigarettes    Quit date: 05/05/2017    Years since quitting: 4.5   Smokeless tobacco: Former    Types: Snuff    Quit date: 1997   Tobacco comments:    11/12/2015 "quit using chew in ~  1997"  Vaping Use   Vaping Use: Never used  Substance and Sexual Activity   Alcohol use: Not Currently   Drug use: Not Currently    Types: Marijuana    Comment: "in the early 1990s; recreational"   Sexual activity: Yes    Partners:  Female  Other Topics Concern   Not on file  Social History Narrative   Not on file   Social Determinants of Health   Financial Resource Strain: Not on file  Food Insecurity: No Food Insecurity (12/03/2021)   Hunger Vital Sign    Worried About Running Out of Food in the Last Year: Never true    Ran Out of Food in the Last Year: Never true  Transportation Needs: No Transportation Needs (12/03/2021)   PRAPARE - Hydrologist (Medical): No    Lack of Transportation (Non-Medical): No  Physical Activity: Not on file  Stress: Not on file  Social Connections: Not on file  Intimate Partner Violence: Not At Risk (12/03/2021)   Humiliation, Afraid, Rape, and Kick questionnaire    Fear of Current or Ex-Partner: No    Emotionally Abused: No    Physically Abused: No    Sexually Abused: No  :  Pertinent items are noted in HPI.  Exam: Patient Vitals for the past 24 hrs:  BP Temp Temp src Pulse Resp SpO2 Height Weight  12/03/21 0315 -- -- -- -- -- -- '5\' 10"'$  (1.778 m) 271 lb 6.2 oz (123.1 kg)  12/03/21 0313 122/66 98.4 F (36.9 C) Oral 69 14 97 % -- --  12/03/21 0200 (!) 113/59 -- -- 65 16 92 % -- --  12/03/21 0100 98/73 -- -- 64 14 95 % -- --  12/03/21 0000 103/70 -- -- 68 13 91 % -- --  12/02/21 2326 -- 97.8 F (36.6 C) Oral -- -- -- -- --  12/02/21 2300 104/64 -- -- 64 11 94 % -- --  12/02/21 2200 118/66 -- -- 66 16 92 % -- --  12/02/21 2100 -- -- -- 79 16 95 % -- --  12/02/21 2000 113/65 -- -- 76 13 93 % -- --  12/02/21 1943 -- 98.3 F (36.8 C) Oral -- -- -- -- --  12/02/21 1900 119/82 -- -- 73 13 95 % -- --  12/02/21 1800 109/72 -- -- 84 20 93 % -- --  12/02/21 1600 121/82 -- -- 86 16 94 % -- --  12/02/21 1555 -- 98.1  F (36.7 C) Oral -- -- -- -- --  12/02/21 1500 123/81 -- -- 79 18 91 % -- --  12/02/21 1400 (!) 85/52 -- -- 81 15 92 % -- --  12/02/21 1300 (!) 149/95 -- -- 98 16 91 % -- --  12/02/21 1200 (!) 134/91 -- -- 88 15 95 % -- --  12/02/21 1141 -- 98.2 F (36.8 C) Oral -- -- -- -- --  12/02/21 1100 (!) 138/90 -- -- 77 17 95 % -- --  12/02/21 1015 (!) 141/84 -- -- 78 17 95 % -- --  12/02/21 1000 -- -- -- 78 15 95 % -- --  12/02/21 0900 (!) 150/98 -- -- 93 13 94 % -- --  12/02/21 0810 -- 98 F (36.7 C) Oral -- -- -- -- --  12/02/21 0800 (!) 155/94 -- -- 70 14 96 % -- --  12/02/21 0700 (!) 140/73 -- -- 70 13 96 % -- --   Physical Exam Vitals reviewed.  HENT:     Head: Normocephalic and atraumatic.  Eyes:     Pupils: Pupils are equal, round, and reactive to light.  Cardiovascular:     Rate and Rhythm: Normal rate and regular rhythm.     Heart sounds: Normal heart sounds.  Pulmonary:  Effort: Pulmonary effort is normal.     Breath sounds: Normal breath sounds.  Abdominal:     General: Bowel sounds are normal.     Palpations: Abdomen is soft.  Musculoskeletal:        General: No tenderness or deformity. Normal range of motion.     Cervical back: Normal range of motion.  Lymphadenopathy:     Cervical: No cervical adenopathy.  Skin:    General: Skin is warm and dry.     Findings: No erythema or rash.  Neurological:     Mental Status: He is alert and oriented to person, place, and time.  Psychiatric:        Behavior: Behavior normal.        Thought Content: Thought content normal.        Judgment: Judgment normal.     Recent Labs    12/01/21 1720 12/02/21 0423  WBC 7.5 5.5  HGB 13.9 12.6*  HCT 40.2 36.0*  PLT 230 187    Recent Labs    12/01/21 1720 12/02/21 0423  NA 139 141  K 3.4* 4.2  CL 108 108  CO2 23 23  GLUCOSE 125* 117*  BUN 13 9  CREATININE 1.16 1.07  CALCIUM 9.1 8.6*    Blood smear review: None  Pathology: None    Assessment and Plan: Mr.  Jennings is a really nice 51 year old white male.  He has history of relapsed follicular large cell lymphoma.  He has had no evidence of systemic relapse.  His last PET scan was done in July.  Everything looked fine with that PET scan.  Again I do not see any evidence of lymphoma as a problem.  I do not believe that any of his treatments were an issue.  He had an echocardiogram that was done on 12/02/2021.  This showed a wonderful ejection fraction of 60-65%.  As far as I can tell, he has had no problems with atrial fibrillation on the cardiac monitor.  I think that his diabetes probably was the most likely reason for him to have the CVA.  He is on aspirin.  He probably needs to be on Plavix.  He also is on Lipitor.  Again, we will continue him on his maintenance Gazyva therapy.  He is halfway through his protocol.  He comes back to see [place] in a couple weeks for his next treatment.  I am just happy that he was able to get to the hospital so quickly and have thrombolytic therapy.  It is obvious that the ER docs and the neurologist were totally on top of this and because of this, James Jennings will have a wonderful outcome and should not have a full recovery.    Lattie Haw, MD  Psalms 6:2

## 2021-12-03 NOTE — Evaluation (Signed)
Physical Therapy Evaluation and Discharge Patient Details Name: James Jennings MRN: [id number] DOB: 1970/12/11 Today's Date: 12/03/2021  History of Present Illness  51 y.o. male admitted for HA, vision change, confusion and word finding difficulty. TNK given. MRI + L occipital lobe and hippocampal tail in PCA distribution. History of HTN, DM, B cell lymphoma on remission  Clinical Impression   Patient evaluated by Physical Therapy with no further acute PT needs identified. Patient scored 24/24 on Dynamic Gait Index and 54/56 on Berg Balance Assessment. Reports he is still having some language issues, especially notices when reading. Denies visual issue. PT is signing off. Thank you for this referral.        Recommendations for follow up therapy are one component of a multi-disciplinary discharge planning process, led by the attending physician.  Recommendations may be updated based on patient status, additional functional criteria and insurance authorization.  Follow Up Recommendations No PT follow up      Assistance Recommended at Discharge None  Patient can return home with the following       Equipment Recommendations None recommended by PT  Recommendations for Other Services  Speech consult    Functional Status Assessment Patient has not had a recent decline in their functional status     Precautions / Restrictions Precautions Precautions: None      Mobility  Bed Mobility Overal bed mobility: Independent                  Transfers Overall transfer level: Independent Equipment used: None                    Ambulation/Gait Ambulation/Gait assistance: Independent Gait Distance (Feet): 200 Feet Assistive device: None Gait Pattern/deviations: WFL(Within Functional Limits)   Gait velocity interpretation: >2.62 ft/sec, indicative of community ambulatory   General Gait Details: able to perform head turns, vary gait speed, see DGI  Stairs Stairs:  Yes Stairs assistance: Independent Stair Management: No rails, Alternating pattern, Forwards Number of Stairs: 5    Wheelchair Mobility    Modified Rankin (Stroke Patients Only) Modified Rankin (Stroke Patients Only) Pre-Morbid Rankin Score: No symptoms Modified Rankin: No significant disability     Balance Overall balance assessment: Independent                               Standardized Balance Assessment Standardized Balance Assessment : Berg Balance Test, Dynamic Gait Index Berg Balance Test Sit to Stand: Able to stand without using hands and stabilize independently Standing Unsupported: Able to stand safely 2 minutes Sitting with Back Unsupported but Feet Supported on Floor or Stool: Able to sit safely and securely 2 minutes Stand to Sit: Sits safely with minimal use of hands Transfers: Able to transfer safely, minor use of hands Standing Unsupported with Eyes Closed: Able to stand 10 seconds safely Standing Ubsupported with Feet Together: Able to place feet together independently and stand 1 minute safely From Standing, Reach Forward with Outstretched Arm: Can reach confidently >25 cm (10") From Standing Position, Pick up Object from Floor: Able to pick up shoe safely and easily From Standing Position, Turn to Look Behind Over each Shoulder: Looks behind from both sides and weight shifts well Turn 360 Degrees: Able to turn 360 degrees safely in 4 seconds or less Standing Unsupported, Alternately Place Feet on Step/Stool: Able to stand independently and safely and complete 8 steps in 20 seconds Standing Unsupported, One Foot in  Front: Able to place foot tandem independently and hold 30 seconds Standing on One Leg: Able to lift leg independently and hold equal to or more than 3 seconds Total Score: 54 Dynamic Gait Index Level Surface: Normal Change in Gait Speed: Normal Gait with Horizontal Head Turns: Normal Gait with Vertical Head Turns: Normal Gait and  Pivot Turn: Normal Step Over Obstacle: Normal Step Around Obstacles: Normal Steps: Normal Total Score: 24       Pertinent Vitals/Pain Pain Assessment Pain Assessment: No/denies pain    Home Living Family/patient expects to be discharged to:: Private residence Living Arrangements: Spouse/significant other;Children Available Help at Discharge: Family Type of Home: House Home Access: Level entry     Alternate Level Stairs-Number of Steps: flight Home Layout: Two level;Bed/bath upstairs Home Equipment: None      Prior Function Prior Level of Function : Independent/Modified Independent                     Hand Dominance   Dominant Hand: Right    Extremity/Trunk Assessment   Upper Extremity Assessment Upper Extremity Assessment: Overall WFL for tasks assessed    Lower Extremity Assessment Lower Extremity Assessment: Overall WFL for tasks assessed    Cervical / Trunk Assessment Cervical / Trunk Assessment: Normal  Communication   Communication: Expressive difficulties (pt reports mild word-finding and comprehension issues, none noted during session)  Cognition Arousal/Alertness: Awake/alert Behavior During Therapy: WFL for tasks assessed/performed Overall Cognitive Status: Within Functional Limits for tasks assessed                                          General Comments      Exercises     Assessment/Plan    PT Assessment Patient does not need any further PT services  PT Problem List         PT Treatment Interventions      PT Goals (Current goals can be found in the Care Plan section)  Acute Rehab PT Goals Patient Stated Goal: go home today PT Goal Formulation: All assessment and education complete, DC therapy    Frequency       Co-evaluation               AM-PAC PT "6 Clicks" Mobility  Outcome Measure Help needed turning from your back to your side while in a flat bed without using bedrails?: None Help needed  moving from lying on your back to sitting on the side of a flat bed without using bedrails?: None Help needed moving to and from a bed to a chair (including a wheelchair)?: None Help needed standing up from a chair using your arms (e.g., wheelchair or bedside chair)?: None Help needed to walk in hospital room?: None Help needed climbing 3-5 steps with a railing? : None 6 Click Score: 24    End of Session Equipment Utilized During Treatment: Gait belt Activity Tolerance: Patient tolerated treatment well Patient left: in chair;with call bell/phone within reach Nurse Communication: Mobility status;Other (comment) (able to be up independently) PT Visit Diagnosis: Difficulty in walking, not elsewhere classified (R26.2)    Time: 0240-9735 PT Time Calculation (min) (ACUTE ONLY): 11 min   Charges:   PT Evaluation $PT Eval Low Complexity: [place], PT Acute Rehabilitation Services  Office [phone]   Jeanie Cooks  Audiel Scheiber 12/03/2021, 8:24 AM

## 2021-12-04 ENCOUNTER — Inpatient Hospital Stay (HOSPITAL_COMMUNITY): Payer: BC Managed Care – PPO

## 2021-12-04 ENCOUNTER — Encounter (HOSPITAL_COMMUNITY)
Admission: EM | Disposition: A | Discharge status: Home / Self Care | Payer: Self-pay | Source: Home / Self Care | Attending: Neurology

## 2021-12-04 ENCOUNTER — Encounter (HOSPITAL_COMMUNITY): Payer: Self-pay | Admitting: Student in an Organized Health Care Education/Training Program

## 2021-12-04 ENCOUNTER — Inpatient Hospital Stay (HOSPITAL_COMMUNITY): Payer: BC Managed Care – PPO | Admitting: Anesthesiology

## 2021-12-04 DIAGNOSIS — I639 Cerebral infarction, unspecified: Secondary | ICD-10-CM

## 2021-12-04 DIAGNOSIS — E7800 Pure hypercholesterolemia, unspecified: Secondary | ICD-10-CM | POA: Diagnosis not present

## 2021-12-04 DIAGNOSIS — I10 Essential (primary) hypertension: Secondary | ICD-10-CM

## 2021-12-04 DIAGNOSIS — Q2112 Patent foramen ovale: Secondary | ICD-10-CM

## 2021-12-04 DIAGNOSIS — I63432 Cerebral infarction due to embolism of left posterior cerebral artery: Secondary | ICD-10-CM | POA: Diagnosis not present

## 2021-12-04 HISTORY — PX: TEE WITHOUT CARDIOVERSION: SHX5443

## 2021-12-04 HISTORY — PX: BUBBLE STUDY: SHX6837

## 2021-12-04 LAB — BETA-2-GLYCOPROTEIN I ABS, IGG/M/A
Beta-2 Glyco I IgG: <9 GPI IgG units (ref 0–20)
Beta-2-Glycoprotein I IgA: <9 GPI IgA units (ref 0–25)
Beta-2-Glycoprotein I IgM: <9 GPI IgM units (ref 0–32)

## 2021-12-04 LAB — CARDIOLIPIN ANTIBODIES, IGG, IGM, IGA
Anticardiolipin IgA: <9 APL U/mL (ref 0–11)
Anticardiolipin IgG: <9 GPL U/mL (ref 0–14)
Anticardiolipin IgM: <9 MPL U/mL (ref 0–12)

## 2021-12-04 LAB — HOMOCYSTEINE: Homocysteine: 13.3 umol/L (ref 0.0–14.5)

## 2021-12-04 SURGERY — ECHOCARDIOGRAM, TRANSESOPHAGEAL
Anesthesia: Monitor Anesthesia Care

## 2021-12-04 MED ORDER — ASPIRIN 81 MG PO TBEC: 81.0000 mg | DELAYED_RELEASE_TABLET | Freq: Every day | ORAL | 12 refills | Status: DC

## 2021-12-04 MED ORDER — SODIUM CHLORIDE 0.9 % IV SOLN: INTRAVENOUS | Status: DC

## 2021-12-04 MED ORDER — PROPOFOL 500 MG/50ML IV EMUL
INTRAVENOUS | Status: DC | PRN
  Administered 2021-12-04: 150 ug/kg/min via INTRAVENOUS

## 2021-12-04 MED ORDER — EPHEDRINE SULFATE-NACL 50-0.9 MG/10ML-% IV SOSY
PREFILLED_SYRINGE | INTRAVENOUS | Status: DC | PRN
  Administered 2021-12-04: 5 mg via INTRAVENOUS

## 2021-12-04 MED ORDER — CLOPIDOGREL BISULFATE 75 MG PO TABS: 75.0000 mg | ORAL_TABLET | Freq: Every day | ORAL | 3 refills | Status: DC

## 2021-12-04 MED ORDER — PHENYLEPHRINE 80 MCG/ML (10ML) SYRINGE FOR IV PUSH (FOR BLOOD PRESSURE SUPPORT)
PREFILLED_SYRINGE | INTRAVENOUS | Status: DC | PRN
  Administered 2021-12-04 (×3): 80 ug via INTRAVENOUS
  Administered 2021-12-04 (×3): 160 ug via INTRAVENOUS
  Administered 2021-12-04: 80 ug via INTRAVENOUS

## 2021-12-04 MED ORDER — PROPOFOL 10 MG/ML IV BOLUS
INTRAVENOUS | Status: DC | PRN
  Administered 2021-12-04: 30 mg via INTRAVENOUS

## 2021-12-04 MED ORDER — LIDOCAINE HCL (CARDIAC) PF 100 MG/5ML IV SOSY
PREFILLED_SYRINGE | INTRAVENOUS | Status: DC | PRN
  Administered 2021-12-04: 60 mg via INTRATRACHEAL

## 2021-12-04 NOTE — Plan of Care (Signed)
  Problem: Education: Goal: Knowledge of General Education information will improve Description: Including pain rating scale, medication(s)/side effects and non-pharmacologic comfort measures Outcome: Progressing   Problem: Health Behavior/Discharge Planning: Goal: Ability to manage health-related needs will improve Outcome: Progressing   Problem: Clinical Measurements: Goal: Ability to maintain clinical measurements within normal limits will improve Outcome: Progressing Goal: Will remain free from infection Outcome: Progressing Goal: Diagnostic test results will improve Outcome: Progressing Goal: Respiratory complications will improve Outcome: Progressing Goal: Cardiovascular complication will be avoided Outcome: Progressing   Problem: Activity: Goal: Risk for activity intolerance will decrease Outcome: Progressing   Problem: Nutrition: Goal: Adequate nutrition will be maintained Outcome: Progressing   Problem: Coping: Goal: Level of anxiety will decrease Outcome: Progressing   Problem: Elimination: Goal: Will not experience complications related to bowel motility Outcome: Progressing Goal: Will not experience complications related to urinary retention Outcome: Progressing   Problem: Pain Managment: Goal: General experience of comfort will improve Outcome: Progressing   Problem: Safety: Goal: Ability to remain free from injury will improve Outcome: Progressing   Problem: Skin Integrity: Goal: Risk for impaired skin integrity will decrease Outcome: Progressing   Problem: Education: Goal: Knowledge of disease or condition will improve Outcome: Progressing Goal: Knowledge of secondary prevention will improve (MUST DOCUMENT ALL) Outcome: Progressing Goal: Knowledge of patient specific risk factors will improve Elta [place] N/A or DELETE if not current risk factor) Outcome: Progressing   Problem: Ischemic Stroke/TIA Tissue Perfusion: Goal: Complications of ischemic  stroke/TIA will be minimized Outcome: Progressing   Problem: Coping: Goal: Will verbalize positive feelings about self Outcome: Progressing Goal: Will identify appropriate support needs Outcome: Progressing   Problem: Health Behavior/Discharge Planning: Goal: Ability to manage health-related needs will improve Outcome: Progressing Goal: Goals will be collaboratively established with patient/family Outcome: Progressing   Problem: Self-Care: Goal: Ability to participate in self-care as condition permits will improve Outcome: Progressing Goal: Verbalization of feelings and concerns over difficulty with self-care will improve Outcome: Progressing Goal: Ability to communicate needs accurately will improve Outcome: Progressing   Problem: Nutrition: Goal: Risk of aspiration will decrease Outcome: Progressing Goal: Dietary intake will improve Outcome: Progressing

## 2021-12-04 NOTE — Anesthesia Procedure Notes (Signed)
Procedure Name: MAC Date/Time: 12/04/2021 12:50 PM  Performed by: Mariea Clonts, CRNAPre-anesthesia Checklist: Patient identified, Emergency Drugs available, Suction available, Patient being monitored and Timeout performed Patient Re-evaluated:Patient Re-evaluated prior to induction Oxygen Delivery Method: Simple face mask and Nasal cannula

## 2021-12-04 NOTE — Anesthesia Postprocedure Evaluation (Signed)
Anesthesia Post Note  Patient: [name]  Procedure(s) Performed: TRANSESOPHAGEAL ECHOCARDIOGRAM (TEE) BUBBLE STUDY     Patient location during evaluation: Endoscopy Anesthesia Type: MAC Level of consciousness: awake and alert Pain management: pain level controlled Vital Signs Assessment: post-procedure vital signs reviewed and stable Respiratory status: spontaneous breathing, nonlabored ventilation and respiratory function stable Cardiovascular status: stable and blood pressure returned to baseline Postop Assessment: no apparent nausea or vomiting Anesthetic complications: no  No notable events documented.  Last Vitals:  Vitals:   12/04/21 1336 12/04/21 1346  BP: 105/75 122/68  Pulse: 80 82  Resp: 12 17  Temp:    SpO2: 97% 96%    Last Pain:  Vitals:   12/04/21 1346  TempSrc:   PainSc: 0-No pain                 Micheal Murad,W. EDMOND

## 2021-12-04 NOTE — Discharge Instructions (Addendum)
You were admitted for a stroke and received " clot buster" medication for stroke treatment. Stroke work-up found to have a small hole in between the heart and we will send you to see Dr. Burt Knack as outpatient to consider if the hole need to be closed.  You will be called by Dr. Antionette Char office. Continue aspirin 81 and Plavix 75 daily until you see Dr. Burt Knack and then listen to Dr. Antionette Char instruction. Continue atorvastatin 80 mg daily at home Lifestyle changes, regular exercises, healthy diet. You also see [name] at [facility] neurology associate for stroke follow-up. Follow-up with your PCP in 2 weeks after discharge for continued care. Continue all your other home medications If any new stroke symptoms, please call 911 or go to nearest ER for evaluation.

## 2021-12-04 NOTE — Discharge Summary (Signed)
Stroke Discharge Summary  Patient ID: James Jennings   MRN: [id number]      DOB: 1970-09-02  Date of Admission: 12/01/2021 Date of Discharge: 12/04/2021  Attending Physician:  Stroke, Md, MD Consultant(s):  none Patient's PCP:  [name], MD  DISCHARGE DIAGNOSIS:  Principal Problem:   Acute left PCA infarct s/p TNK, etiology unclear   Active Problems:   PFO   HTN   DM   HLD   Obesity   B cell lymphoma s/p chemo in remission   Allergies as of 12/04/2021       Reactions   Mango Flavor Swelling, Other (See Comments)   LIPS SWELL   Rituximab Other (See Comments)   Blood pressure went extremely low.    Shellfish Allergy Anaphylaxis   Lactose Intolerance (gi) Diarrhea        Medication List     STOP taking these medications    ibuprofen 200 MG tablet Commonly known as: ADVIL   montelukast 10 MG tablet Commonly known as: SINGULAIR   Semaglutide (1 MG/DOSE) 4 MG/3ML Sopn       TAKE these medications    acetaminophen 325 MG tablet Commonly known as: TYLENOL Take 650 mg by mouth daily as needed for moderate pain or headache.   aspirin EC 81 MG tablet Take 1 tablet (81 mg total) by mouth daily. Swallow whole. Start taking on: December 05, 2021   atorvastatin 80 MG tablet Commonly known as: LIPITOR Take 1 tablet (80 mg total) by mouth daily.   cetirizine 10 MG tablet Commonly known as: ZYRTEC Take 10 mg by mouth daily.   clopidogrel 75 MG tablet Commonly known as: PLAVIX Take 1 tablet (75 mg total) by mouth daily. Start taking on: December 05, 2021   Farxiga 10 MG Tabs tablet Generic drug: dapagliflozin propanediol Take 10 mg by mouth daily.   lidocaine-prilocaine cream Commonly known as: EMLA Apply 1 application topically as needed. Apply to [facility] 1 hour prior to procedure.   metFORMIN 500 MG tablet Commonly known as: GLUCOPHAGE Take 1 tablet (500 mg total) by mouth daily.   multivitamin Tabs tablet Take 1 tablet by mouth daily.    polycarbophil 625 MG tablet Commonly known as: FIBERCON Take 625 mg by mouth daily.        LABORATORY STUDIES CBC    Component Value Date/Time   WBC 5.5 12/02/2021 0423   RBC 3.95 (L) 12/02/2021 0423   HGB 12.6 (L) 12/02/2021 0423   HGB 13.1 10/20/2021 0813   HGB 14.0 08/10/2018 1604   HGB 14.4 12/03/2016 1155   HGB 14.2 12/06/2015 1606   HCT 36.0 (L) 12/02/2021 0423   HCT 41.3 08/10/2018 1604   HCT 41.1 12/03/2016 1155   HCT 40.9 12/06/2015 1606   PLT 187 12/02/2021 0423   PLT 201 10/20/2021 0813   PLT 227 12/03/2016 1155   PLT 257 12/06/2015 1606   MCV 91.1 12/02/2021 0423   MCV 92 08/10/2018 1604   MCV 92 12/03/2016 1155   MCV 89.7 12/06/2015 1606   MCH 31.9 12/02/2021 0423   MCHC 35.0 12/02/2021 0423   RDW 12.6 12/02/2021 0423   RDW 12.8 08/10/2018 1604   RDW 12.9 12/03/2016 1155   RDW 13.0 12/06/2015 1606   LYMPHSABS 1.5 12/01/2021 1720   LYMPHSABS 1.5 08/10/2018 1604   LYMPHSABS 1.1 12/03/2016 1155   LYMPHSABS 3.1 12/06/2015 1606   MONOABS 1.1 (H) 12/01/2021 1720   MONOABS 0.7 12/06/2015 1606  EOSABS 0.2 12/01/2021 1720   EOSABS 0.2 08/10/2018 1604   EOSABS 0.3 12/03/2016 1155   BASOSABS 0.0 12/01/2021 1720   BASOSABS 0.0 08/10/2018 1604   BASOSABS 0.0 12/03/2016 1155   BASOSABS 0.0 12/06/2015 1606   CMP    Component Value Date/Time   NA 141 12/02/2021 0423   NA 139 12/18/2020 0817   NA 141 12/03/2016 1155   NA 141 02/07/2016 1149   K 4.2 12/02/2021 0423   K 3.8 12/03/2016 1155   K 4.3 02/07/2016 1149   CL 108 12/02/2021 0423   CL 100 12/03/2016 1155   CO2 23 12/02/2021 0423   CO2 27 12/03/2016 1155   CO2 27 02/07/2016 1149   GLUCOSE 117 (H) 12/02/2021 0423   GLUCOSE 128 (H) 12/03/2016 1155   BUN 9 12/02/2021 0423   BUN 16 12/18/2020 0817   BUN 10 12/03/2016 1155   BUN 10.9 02/07/2016 1149   CREATININE 1.07 12/02/2021 0423   CREATININE 1.08 10/20/2021 0813   CREATININE 1.0 12/03/2016 1155   CREATININE 0.9 02/07/2016 1149   CALCIUM  8.6 (L) 12/02/2021 0423   CALCIUM 9.1 12/03/2016 1155   CALCIUM 9.6 02/07/2016 1149   PROT 5.7 (L) 12/02/2021 0423   PROT 6.5 12/18/2020 0817   PROT 7.1 12/03/2016 1155   PROT 7.4 02/07/2016 1149   ALBUMIN 3.3 (L) 12/02/2021 0423   ALBUMIN 4.4 12/18/2020 0817   ALBUMIN 3.9 02/07/2016 1149   AST 24 12/02/2021 0423   AST 18 10/20/2021 0813   AST 17 02/07/2016 1149   ALT 25 12/02/2021 0423   ALT 19 10/20/2021 0813   ALT 24 12/03/2016 1155   ALT 16 02/07/2016 1149   ALKPHOS 102 12/02/2021 0423   ALKPHOS 99 (H) 12/03/2016 1155   ALKPHOS 127 02/07/2016 1149   BILITOT 0.8 12/02/2021 0423   BILITOT 0.6 10/20/2021 0813   BILITOT 0.42 02/07/2016 1149   GFRNONAA >60 12/02/2021 0423   GFRNONAA >60 10/20/2021 0813   GFRAA 107 03/14/2020 0839   GFRAA >60 10/31/2019 0850   COAGS Lab Results  Component Value Date   INR 0.9 12/01/2021   INR 0.9 11/02/2019   INR 0.88 02/05/2016   Lipid Panel    Component Value Date/Time   CHOL 132 12/02/2021 0423   CHOL 171 12/18/2020 0817   TRIG 92 12/02/2021 0423   HDL 37 (L) 12/02/2021 0423   HDL 42 12/18/2020 0817   CHOLHDL 3.6 12/02/2021 0423   VLDL 18 12/02/2021 0423   LDLCALC 77 12/02/2021 0423   LDLCALC 110 (H) 12/18/2020 0817   HgbA1C  Lab Results  Component Value Date   HGBA1C 6.4 (H) 12/02/2021   Urinalysis    Component Value Date/Time   COLORURINE YELLOW 12/01/2021 1940   APPEARANCEUR CLEAR 12/01/2021 1940   LABSPEC 1.039 (H) 12/01/2021 1940   PHURINE 5.0 12/01/2021 1940   GLUCOSEU >=500 (A) 12/01/2021 1940   HGBUR NEGATIVE 12/01/2021 1940   BILIRUBINUR NEGATIVE 12/01/2021 1940   KETONESUR NEGATIVE 12/01/2021 1940   PROTEINUR NEGATIVE 12/01/2021 1940   NITRITE NEGATIVE 12/01/2021 1940   LEUKOCYTESUR NEGATIVE 12/01/2021 1940   Urine Drug Screen     Component Value Date/Time   LABOPIA NONE DETECTED 12/01/2021 1940   COCAINSCRNUR NONE DETECTED 12/01/2021 1940   LABBENZ NONE DETECTED 12/01/2021 1940   AMPHETMU NONE  DETECTED 12/01/2021 1940   THCU NONE DETECTED 12/01/2021 1940   LABBARB NONE DETECTED 12/01/2021 1940    Alcohol Level    Component Value Date/Time  ETH <10 12/01/2021 1704     SIGNIFICANT DIAGNOSTIC STUDIES VAS [place] LOWER EXTREMITY VENOUS (DVT)  Result Date: 12/04/2021  Lower Venous DVT Study Patient Name:  James Jennings   Date of Exam:   12/04/2021 Medical Rec #: [id number]   Accession #:    [id number] Date of Birth: 10-30-1970  Patient Gender: M Patient Age:   20 years Exam Location:  [facility] Procedure:      VAS [place] LOWER EXTREMITY VENOUS (DVT) Referring Phys: Cornelius Moras Cait Locust --------------------------------------------------------------------------------  Indications: Stroke.  Comparison Study: No prior studies. Performing Technologist: Darlin Coco RDMS, RVT  Examination Guidelines: A complete evaluation includes B-mode imaging, spectral Doppler, color Doppler, and power Doppler as needed of all accessible portions of each vessel. Bilateral testing is considered an integral part of a complete examination. Limited examinations for reoccurring indications may be performed as noted. The reflux portion of the exam is performed with the patient in reverse Trendelenburg.  +---------+---------------+---------+-----------+----------+--------------+ RIGHT    CompressibilityPhasicitySpontaneityPropertiesThrombus Aging +---------+---------------+---------+-----------+----------+--------------+ CFV      Full           Yes      Yes                                 +---------+---------------+---------+-----------+----------+--------------+ SFJ      Full                                                        +---------+---------------+---------+-----------+----------+--------------+ FV Prox  Full                                                        +---------+---------------+---------+-----------+----------+--------------+ FV Mid   Full                                                         +---------+---------------+---------+-----------+----------+--------------+ FV DistalFull                                                        +---------+---------------+---------+-----------+----------+--------------+ PFV      Full                                                        +---------+---------------+---------+-----------+----------+--------------+ POP      Full           Yes      Yes                                 +---------+---------------+---------+-----------+----------+--------------+ PTV      Full                                                        +---------+---------------+---------+-----------+----------+--------------+  PERO     Full                                                        +---------+---------------+---------+-----------+----------+--------------+ Gastroc  Full                                                        +---------+---------------+---------+-----------+----------+--------------+   +---------+---------------+---------+-----------+----------+--------------+ LEFT     CompressibilityPhasicitySpontaneityPropertiesThrombus Aging +---------+---------------+---------+-----------+----------+--------------+ CFV      Full           Yes      Yes                                 +---------+---------------+---------+-----------+----------+--------------+ SFJ      Full                                                        +---------+---------------+---------+-----------+----------+--------------+ FV Prox  Full                                                        +---------+---------------+---------+-----------+----------+--------------+ FV Mid   Full                                                        +---------+---------------+---------+-----------+----------+--------------+ FV DistalFull                                                         +---------+---------------+---------+-----------+----------+--------------+ PFV      Full                                                        +---------+---------------+---------+-----------+----------+--------------+ POP      Full           Yes      Yes                                 +---------+---------------+---------+-----------+----------+--------------+ PTV      Full                                                        +---------+---------------+---------+-----------+----------+--------------+  PERO     Full                                                        +---------+---------------+---------+-----------+----------+--------------+ Gastroc  Full                                                        +---------+---------------+---------+-----------+----------+--------------+     Summary: RIGHT: - There is no evidence of deep vein thrombosis in the lower extremity.  - No cystic structure found in the popliteal fossa.  LEFT: - There is no evidence of deep vein thrombosis in the lower extremity.  - No cystic structure found in the popliteal fossa.  *See table(s) above for measurements and observations.    Preliminary    EEG adult  Result Date: 12/03/2021 Lora Havens, MD     12/03/2021  8:52 AM Patient Name: James Jennings MRN: [id number] Epilepsy Attending: Lora Havens Referring Physician/Provider: Rosalin Hawking, MD Date: 12/02/2021 Duration: 21.56 mins Patient history:  51 y.o. male with history of HTN, DM, B cell lymphoma on remission admitted for HA, vision change, confusion and word finding difficulty. EEG to evaluate for seizure Level of alertness: Awake AEDs during EEG study: None Technical aspects: This EEG study was done with scalp electrodes positioned according to the 10-20 International system of electrode placement. Electrical activity was reviewed with band pass filter of 1-'70Hz'$ , sensitivity of 7 uV/mm, display speed of 34m/sec with a '60Hz'$  notched  filter applied as appropriate. EEG data were recorded continuously and digitally stored.  Video monitoring was available and reviewed as appropriate. Description: The posterior dominant rhythm consists of 9 Hz activity of moderate voltage (25-35 uV) seen predominantly in posterior head regions, symmetric and reactive to eye opening and eye closing. Hyperventilation and photic stimulation were not performed.   IMPRESSION: This study is within normal limits. No seizures or epileptiform discharges were seen throughout the recording. A normal interictal EEG does not exclude the diagnosis of epilepsy. PLora Havens  MR BRAIN W WO CONTRAST  Result Date: 12/02/2021 CLINICAL DATA:  Vision changes.  Unable to see on the right side. EXAM: MRI HEAD WITHOUT AND WITH CONTRAST TECHNIQUE: Multiplanar, multiecho pulse sequences of the brain and surrounding structures were obtained without and with intravenous contrast. CONTRAST:  139mGADAVIST GADOBUTROL 1 MMOL/ML IV SOLN COMPARISON:  Noncontrast head CT 1 day prior. FINDINGS: Brain: There is mild cortical diffusion restriction in the left occipital lobe (2-22) with faint FLAIR signal abnormality consistent with acute infarct in the PCA distribution. The tail of the hippocampus is also likely involved (2-27). There is no associated hemorrhage or mass effect There is no other evidence of acute infarct. There is no acute intracranial hemorrhage or extra-axial fluid collection. Background parenchymal volume is normal. The ventricles are normal in size. Gray-white differentiation is otherwise preserved. Parenchymal signal is otherwise normal, with no significant burden of underlying chronic small vessel ischemic change. There is no mass lesion or abnormal enhancement. There is no mass effect or midline shift. Vascular: Normal flow voids. Skull and upper cervical spine: Normal marrow signal. Sinuses/Orbits: The paranasal sinuses are clear. The globes  and orbits are  unremarkable. Other: None. IMPRESSION: Acute infarct involving left occipital lobe cortex and hippocampal tail in the PCA distribution without hemorrhage or mass effect. Electronically Signed   By: Valetta Mole M.D.   On: 12/02/2021 18:23   ECHOCARDIOGRAM COMPLETE  Result Date: 12/02/2021    ECHOCARDIOGRAM REPORT   Patient Name:   James Jennings  Date of Exam: 12/02/2021 Medical Rec #:  [id number]  Height:       70.0 in Accession #:    [id number] Weight:       272.0 lb Date of Birth:  1970/10/07 BSA:          2.379 m Patient Age:    58 years   BP:           155/94 mmHg Patient Gender: M          HR:           88 bpm. Exam Location:  Inpatient Procedure: 2D Echo, Cardiac Doppler and Color Doppler Indications:    Stroke I63.9  History:        Patient has prior history of Echocardiogram examinations, most                 recent 04/01/2020. Risk Factors:Diabetes, Dyslipidemia and Sleep                 Apnea. History of cancer.  Sonographer:    Darlina Sicilian RDCS Referring Phys: [id number] [facility]  Sonographer Comments: Suboptimal apical window. IMPRESSIONS  1. Left ventricular ejection fraction, by estimation, is 60 to 65%. The left ventricle has normal function. The left ventricle has no regional wall motion abnormalities. Left ventricular diastolic parameters were normal.  2. Right ventricular systolic function is normal. The right ventricular size is normal. Tricuspid regurgitation signal is inadequate for assessing PA pressure.  3. The mitral valve is grossly normal. No evidence of mitral valve regurgitation. No evidence of mitral stenosis.  4. The aortic valve was not well visualized. Aortic valve regurgitation is not visualized. No aortic stenosis is present.  5. The inferior vena cava is normal in size with greater than 50% respiratory variability, suggesting right atrial pressure of 3 mmHg. Comparison(s): Similar to prior. FINDINGS  Left Ventricle: Left ventricular ejection fraction, by estimation, is 60 to 65%.  The left ventricle has normal function. The left ventricle has no regional wall motion abnormalities. The left ventricular internal cavity size was normal in size. There is  no left ventricular hypertrophy. Left ventricular diastolic parameters were normal. Right Ventricle: The right ventricular size is normal. Right vetricular wall thickness was not well visualized. Right ventricular systolic function is normal. Tricuspid regurgitation signal is inadequate for assessing PA pressure. Left Atrium: Left atrial size was normal in size. Right Atrium: Right atrial size was normal in size. Pericardium: There is no evidence of pericardial effusion. Mitral Valve: The mitral valve is grossly normal. No evidence of mitral valve regurgitation. No evidence of mitral valve stenosis. Tricuspid Valve: The tricuspid valve is normal in structure. Tricuspid valve regurgitation is not demonstrated. No evidence of tricuspid stenosis. Aortic Valve: The aortic valve was not well visualized. Aortic valve regurgitation is not visualized. No aortic stenosis is present. Pulmonic Valve: The pulmonic valve was not well visualized. Pulmonic valve regurgitation is not visualized. No evidence of pulmonic stenosis. Aorta: The aortic root, ascending aorta and aortic arch are all structurally normal, with no evidence of dilitation or obstruction and the aortic root and ascending aorta are structurally  normal, with no evidence of dilitation. Venous: The inferior vena cava is normal in size with greater than 50% respiratory variability, suggesting right atrial pressure of 3 mmHg. IAS/Shunts: No atrial level shunt detected by color flow Doppler.  LEFT VENTRICLE PLAX 2D LVIDd:         4.70 cm   Diastology LVIDs:         2.90 cm   LV e' medial:    10.50 cm/s LV PW:         1.00 cm   LV E/e' medial:  7.8 LV IVS:        1.10 cm   LV e' lateral:   14.10 cm/s LVOT diam:     2.30 cm   LV E/e' lateral: 5.8 LV SV:         57 LV SV Index:   24 LVOT Area:      4.15 cm  LEFT ATRIUM           Index LA diam:      3.70 cm 1.56 cm/m LA Vol (A4C): 16.8 ml 7.06 ml/m  AORTIC VALVE LVOT Vmax:   71.30 cm/s LVOT Vmean:  53.400 cm/s LVOT VTI:    0.136 m  AORTA Ao Root diam: 3.50 cm Ao Asc diam:  3.40 cm MITRAL VALVE MV Area (PHT): 6.71 cm    SHUNTS MV Decel Time: 113 msec    Systemic VTI:  0.14 m MV E velocity: 81.50 cm/s  Systemic Diam: 2.30 cm MV A velocity: 70.40 cm/s MV E/A ratio:  1.16 Rudean Haskell MD Electronically signed by Rudean Haskell MD Signature Date/Time: 12/02/2021/10:07:44 AM    Final    CT ANGIO HEAD NECK W WO CM (CODE STROKE)  Result Date: 12/01/2021 CLINICAL DATA:  Vision, dizziness, headache. EXAM: CT ANGIOGRAPHY HEAD AND NECK TECHNIQUE: Multidetector CT imaging of the head and neck was performed using the standard protocol during bolus administration of intravenous contrast. Multiplanar CT image reconstructions and MIPs were obtained to evaluate the vascular anatomy. Carotid stenosis measurements (when applicable) are obtained utilizing NASCET criteria, using the distal internal carotid diameter as the denominator. RADIATION DOSE REDUCTION: This exam was performed according to the departmental dose-optimization program which includes automated exposure control, adjustment of the mA and/or kV according to patient size and/or use of iterative reconstruction technique. CONTRAST:  49m OMNIPAQUE IOHEXOL 350 MG/ML SOLN COMPARISON:  Same-day noncontrast CT head, CT neck 02/27/2016 FINDINGS: CTA NECK FINDINGS Aortic arch: The imaged aortic arch is normal. The origins of the major branch vessels are patent. The subclavian arteries are patent to the level imaged. Right carotid system: The right common, internal, and external carotid arteries are patent, without hemodynamically significant stenosis or occlusion. There is no dissection or aneurysm. Left carotid system: The left common, internal, and external carotid arteries are patent, with minimal  plaque at the bifurcation but without hemodynamically significant stenosis or occlusion. There is no dissection or aneurysm. Vertebral arteries: Vertebral arteries are patent, without hemodynamically significant stenosis or occlusion. There is no dissection or aneurysm. Skeleton: There is no acute osseous abnormality or suspicious osseous lesion. There is mild degenerative change C5-C6. There is no visible canal hematoma. Other neck: A right chest wall port is noted with the tip off the field of view. The soft tissues of the neck are unremarkable. There is no pathologic lymphadenopathy in the neck. Upper chest: There is right worse than left apical scarring/bullous change. The imaged lung apices are otherwise clear. Review of the MIP  images confirms the above findings CTA HEAD FINDINGS Anterior circulation: There is mild calcified plaque in the intracranial ICAs without hemodynamically significant stenosis or occlusion. The bilateral MCAs are patent, without proximal stenosis or occlusion. The bilateral ACAs are patent, without proximal stenosis or occlusion. There is no aneurysm or AVM. Posterior circulation: The bilateral V4 segments are patent. The basilar artery is patent. The major cerebellar arteries appear patent. The bilateral PCAs are patent, without proximal stenosis or occlusion. There is no aneurysm or AVM. Venous sinuses: Patent. Anatomic variants: None. Review of the MIP images confirms the above findings IMPRESSION: Patent vasculature of the head and neck with no hemodynamically significant stenosis or occlusion. Electronically Signed   By: Valetta Mole M.D.   On: 12/01/2021 18:11   CT HEAD CODE STROKE WO CONTRAST  Result Date: 12/01/2021 CLINICAL DATA:  Code stroke. Tunnel vision, dizziness, headache, unsteady gait, aphasia. EXAM: CT HEAD WITHOUT CONTRAST TECHNIQUE: Contiguous axial images were obtained from the base of the skull through the vertex without intravenous contrast. RADIATION DOSE  REDUCTION: This exam was performed according to the departmental dose-optimization program which includes automated exposure control, adjustment of the mA and/or kV according to patient size and/or use of iterative reconstruction technique. COMPARISON:  None Available. FINDINGS: Brain: There is no acute intracranial hemorrhage, extra-axial fluid collection, or acute infarct. Parenchymal volume is normal. The ventricles are normal in size. Gray-white differentiation is preserved There is no mass lesion.  There is no mass effect or midline shift. Vascular: No hyperdense vessel or unexpected calcification. Skull: Normal. Negative for fracture or focal lesion. Sinuses/Orbits: The imaged paranasal sinuses are clear. The globes and orbits are unremarkable. Other: None. ASPECTS [facility] Stroke Program Early CT Score) - Ganglionic level infarction (caudate, lentiform nuclei, internal capsule, insula, M1-M3 cortex): 7 - Supraganglionic infarction (M4-M6 cortex): 3 Total score (0-10 with 10 being normal): 10 IMPRESSION: 1. No acute intracranial pathology. 2. ASPECTS is 10 Findings were discussed over the telephone with Dr. Alvino Chapel at 5:15 p.m. Electronically Signed   By: Valetta Mole M.D.   On: 12/01/2021 17:20      HISTORY OF PRESENT ILLNESS James Jennings is a 51 y.o. male past medical history of B-cell lymphoma in remission, diabetes, hypertension, sleep apnea, seen as an acute code stroke by Dr. Erlinda Hong at [facility] this afternoon-briefly, last known well at 4 PM, driving back from sons parent-teacher conference when suddenly started having some vision changes, feeling somewhat confused and not being able to see on the right side.  Had a heart rate of 135 according to the wife.  Also complained of a headache.  EMS called, sent to ER for evaluation where he was seen by telestroke service.  No history of migraines. Disabling symptoms-risk benefits alternatives of IV TNKase discussed by the telestroke doctor and IV TNKase  administered 1728 hrs. Reports no headaches. Reports ongoing word finding difficulty off-and-on.     LKW: 1600 hrs. IV thrombolysis given?:  Yes-by telestroke Dr. Erlinda Hong Premorbid modified Rankin scale (mRS): 0   HOSPITAL COURSE [name] is a 51 y.o. male with history of HTN, DM, B cell lymphoma on remission admitted for HA, vision change, confusion and word finding difficulty. TNK given.     Acute left PCA infarct s/p TNK, etiology unclear  CT no acute finding CTA head and neck unremarkable MRI  Acute infarct involving left occipital lobe cortex and hippocampal tail in the PCA distribution without hemorrhage or mass effect 2D Echo  EF 60-65%  LE venous doppler no DVT TEE showed small PFO with Aneurysmal septum  LDL 77 HgbA1c 6.4 Hyper coag panel unremarkable but some still pending  SCDs for VTE prophylaxis No antithrombotic prior to admission, now on ASA and Plavix. Continue on discharge Patient counseled to be compliant with his antithrombotic medications Ongoing aggressive stroke risk factor management Therapy recommendations:  None Disposition:  pending   PFO TEE showed small PFO with Aneurysmal septum  ROPE score 5-6 Give embolic looking stroke and Aneurysmal septum and young age, will send to Dr. Burt Knack for opinion  Diabetes Home meds: faxiga and metformin HgbA1c 6.4 goal < 7.0 Controlled Currently on faxiga and metformin CBG monitoring SSI DM education and close PCP follow up   Hypertension Stable Long term BP goal normotensive   Hyperlipidemia Home meds:  lipitor 80  LDL 77, goal < 70 Now on lipitor 80 Continue statin at discharge   Other Stroke Risk Factors Obesity, Body mass index is 38.94 kg/m.    Other Active Problems B cell lymphoma s/p chemo, now in remission   DISCHARGE EXAM Blood pressure 109/72, pulse 85, temperature 98 F (36.7 C), temperature source Oral, resp. rate 17, height 5' 11.75" (1.822 m), weight 123.4 kg, SpO2 96 %.  General  - Well nourished, well developed, in no apparent distress. Cardiovascular - Regular rhythm and rate.   Mental Status -  Level of arousal and orientation to time, place, and person were intact. Language including expression, naming, repetition, comprehension was assessed and found intact. Fund of Knowledge was assessed and was intact.   Cranial Nerves II - XII - II - Visual field intact OU. However, still feel right visual field less clear than left III, IV, VI - Extraocular movements intact. V - Facial sensation intact bilaterally. VII - Facial movement intact bilaterally. VIII - Hearing & vestibular intact bilaterally. X - Palate elevates symmetrically. XI - Chin turning & shoulder shrug intact bilaterally. XII - Tongue protrusion intact.   Motor Strength - The patient's strength was normal in all extremities and pronator drift was absent.  Bulk was normal and fasciculations were absent.   Motor Tone - Muscle tone was assessed at the neck and appendages and was normal. Sensory - Light touch, temperature/pinprick were assessed and were symmetrical.     Coordination - The patient had normal movements in the hands and feet with no ataxia or dysmetria.  Tremor was absent.   Gait and Station - deferred  Discharge Diet       Diet   Diet heart healthy/carb modified Room service appropriate? Yes; Fluid consistency: Thin   liquids  DISCHARGE PLAN Disposition:  home aspirin 81 mg daily and clopidogrel 75 mg daily for secondary stroke prevention Ongoing stroke risk factor control by Primary Care Physician at time of discharge Follow-up PCP [name], MD in 2 weeks. Follow-up in [facility] in 4 weeks, office to schedule an appointment.  Follow up with Dr. Burt Knack cardiology to consider PFO closure if needed  45 minutes were spent preparing discharge.  Rosalin Hawking, MD PhD Stroke Neurology 12/04/2021 5:53 PM

## 2021-12-04 NOTE — Progress Notes (Signed)
Order to discharge pt home.  Discharge instructions/AVS given to patient and reviewed - education provided as needed.  Medication prescriptions/stroke meds reviewed in detail with pt and spouse at bedside.  Education flyer provided. Pt advised to call PCP and/or come back to the hospital if there are any problems. Pt verbalized understanding.

## 2021-12-04 NOTE — Anesthesia Preprocedure Evaluation (Addendum)
Anesthesia Evaluation  Patient identified by MRN, date of birth, ID band Patient awake    Reviewed: Allergy & Precautions, H&P , NPO status , Patient's Chart, lab work & pertinent test results  History of Anesthesia Complications (+) PONV and history of anesthetic complications  Airway Mallampati: III  TM Distance: >3 FB Neck ROM: Full    Dental no notable dental hx. (+) Teeth Intact, Dental Advisory Given   Pulmonary sleep apnea , former smoker   Pulmonary exam normal breath sounds clear to auscultation       Cardiovascular negative cardio ROS  Rhythm:Regular Rate:Normal     Neuro/Psych   Anxiety     CVA negative neurological ROS  negative psych ROS   GI/Hepatic negative GI ROS, Neg liver ROS,,,  Endo/Other  diabetes, Oral Hypoglycemic Agents  Morbid obesity  Renal/GU negative Renal ROS  negative genitourinary   Musculoskeletal   Abdominal   Peds  Hematology negative hematology ROS (+)   Anesthesia Other Findings   Reproductive/Obstetrics negative OB ROS                             Anesthesia Physical Anesthesia Plan  ASA: 3  Anesthesia Plan: MAC   Post-op Pain Management: Minimal or no pain anticipated   Induction: Intravenous  PONV Risk Score and Plan: 2 and Propofol infusion and Treatment may vary due to age or medical condition  Airway Management Planned: Natural Airway and Nasal Cannula  Additional Equipment:   Intra-op Plan:   Post-operative Plan:   Informed Consent: I have reviewed the patients History and Physical, chart, labs and discussed the procedure including the risks, benefits and alternatives for the proposed anesthesia with the patient or authorized representative who has indicated his/her understanding and acceptance.     Dental advisory given  Plan Discussed with: CRNA  Anesthesia Plan Comments:        Anesthesia Quick Evaluation

## 2021-12-04 NOTE — CV Procedure (Signed)
    TRANSESOPHAGEAL ECHOCARDIOGRAM   NAME:  James Jennings   MRN: [id number] DOB:  [birth year]   ADMIT DATE: 12/01/2021  INDICATIONS: CVA  PROCEDURE:   Informed consent was obtained prior to the procedure. The risks, benefits and alternatives for the procedure were discussed and the patient comprehended these risks.  Risks include, but are not limited to, cough, sore throat, vomiting, nausea, somnolence, esophageal and stomach trauma or perforation, bleeding, low blood pressure, aspiration, pneumonia, infection, trauma to the teeth and death.    Procedural time out performed.  Patient received monitored anesthesia care under the supervision of Dr. Ola Spurr. Patient received a total of 305 mg propofol during the procedure.  The transesophageal probe was inserted in the esophagus and stomach without difficulty and multiple views were obtained.    COMPLICATIONS:    There were no immediate complications.  FINDINGS:  LEFT VENTRICLE: EF = 60-65%. No regional wall motion abnormalities.  RIGHT VENTRICLE: Normal size and function.   LEFT ATRIUM: No thrombus/mass.  LEFT ATRIAL APPENDAGE: No thrombus/mass.   RIGHT ATRIUM: No thrombus/mass.  AORTIC VALVE:  Trileaflet. No regurgitation. No vegetation.  MITRAL VALVE:   Bowing of posterior leaflet without frank prolapse. Trivial regurgitation. No vegetation.  TRICUSPID VALVE: Normal structure. Trivial regurgitation. No vegetation.  PULMONIC VALVE: Grossly normal structure. Trivial regurgitation. No apparent vegetation.  INTERATRIAL SEPTUM: Aneurysmal septum, with slight left to right shunt by color doppler and positive bubble study consistent with right to left shunt. Consistent with small PFO.  PERICARDIUM: Small effusion noted.  DESCENDING AORTA: Mild scattered plaque seen   CONCLUSION: Small PFO with evidence of bidirectional flow across aneurysmal intra-atrial septum.   Buford Dresser, MD, PhD [facility]  [address], [place], [place] [address] [phone]   1:17 PM

## 2021-12-04 NOTE — Progress Notes (Signed)
Lower extremity venous bilateral study completed.   Please see CV Proc for preliminary results.   Gjon Letarte, RDMS, RVT  

## 2021-12-04 NOTE — Transfer of Care (Signed)
Immediate Anesthesia Transfer of Care Note  Patient: James Jennings  Procedure(s) Performed: TRANSESOPHAGEAL ECHOCARDIOGRAM (TEE) BUBBLE STUDY  Patient Location: Endoscopy Unit  Anesthesia Type:MAC  Level of Consciousness: awake, alert , and oriented  Airway & Oxygen Therapy: Patient Spontanous Breathing and Patient connected to nasal cannula oxygen  Post-op Assessment: Report given to RN and Post -op Vital signs reviewed and stable  Post vital signs: Reviewed and stable  Last Vitals:  Vitals Value Taken Time  BP 87/59 12/04/21 1326  Temp 36.3 C 12/04/21 1326  Pulse 90 12/04/21 1329  Resp 17 12/04/21 1329  SpO2 98 % 12/04/21 1329  Vitals shown include unvalidated device data.  Last Pain:  Vitals:   12/04/21 1326  TempSrc: Temporal  PainSc:       Patients Stated Pain Goal: 0 (58/59/29 2446)  Complications: No notable events documented.

## 2021-12-04 NOTE — Plan of Care (Signed)
Problem: Education: Goal: Knowledge of General Education information will improve Description: Including pain rating scale, medication(s)/side effects and non-pharmacologic comfort measures 12/04/2021 1710 by Dhalia Zingaro, Rollen Sox, RN Outcome: Adequate for Discharge 12/04/2021 0846 by Duncan Dull, RN Outcome: Progressing   Problem: Health Behavior/Discharge Planning: Goal: Ability to manage health-related needs will improve 12/04/2021 1710 by Mairead Schwarzkopf, Rollen Sox, RN Outcome: Adequate for Discharge 12/04/2021 0846 by Duncan Dull, RN Outcome: Progressing   Problem: Clinical Measurements: Goal: Ability to maintain clinical measurements within normal limits will improve 12/04/2021 1710 by Serena Petterson, Rollen Sox, RN Outcome: Adequate for Discharge 12/04/2021 0846 by Duncan Dull, RN Outcome: Progressing Goal: Will remain free from infection 12/04/2021 1710 by Judyth Demarais, Rollen Sox, RN Outcome: Adequate for Discharge 12/04/2021 0846 by Duncan Dull, RN Outcome: Progressing Goal: Diagnostic test results will improve 12/04/2021 1710 by Elissa Grieshop, Rollen Sox, RN Outcome: Adequate for Discharge 12/04/2021 0846 by Duncan Dull, RN Outcome: Progressing Goal: Respiratory complications will improve 12/04/2021 1710 by Leonardo Plaia, Rollen Sox, RN Outcome: Adequate for Discharge 12/04/2021 0846 by Duncan Dull, RN Outcome: Progressing Goal: Cardiovascular complication will be avoided 12/04/2021 1710 by Freemon Binford, Rollen Sox, RN Outcome: Adequate for Discharge 12/04/2021 0846 by Duncan Dull, RN Outcome: Progressing   Problem: Activity: Goal: Risk for activity intolerance will decrease 12/04/2021 1710 by Aravind Chrismer, Rollen Sox, RN Outcome: Adequate for Discharge 12/04/2021 0846 by Duncan Dull, RN Outcome: Progressing   Problem: Nutrition: Goal: Adequate nutrition will be maintained 12/04/2021 1710 by Mycheal Veldhuizen, Rollen Sox, RN Outcome: Adequate for Discharge 12/04/2021 0846 by  Duncan Dull, RN Outcome: Progressing   Problem: Coping: Goal: Level of anxiety will decrease 12/04/2021 1710 by Gayl Ivanoff, Rollen Sox, RN Outcome: Adequate for Discharge 12/04/2021 0846 by Duncan Dull, RN Outcome: Progressing   Problem: Elimination: Goal: Will not experience complications related to bowel motility 12/04/2021 1710 by Delayza Lungren, Rollen Sox, RN Outcome: Adequate for Discharge 12/04/2021 0846 by Duncan Dull, RN Outcome: Progressing Goal: Will not experience complications related to urinary retention 12/04/2021 1710 by Fabiano Ginley, Rollen Sox, RN Outcome: Adequate for Discharge 12/04/2021 0846 by Duncan Dull, RN Outcome: Progressing   Problem: Pain Managment: Goal: General experience of comfort will improve 12/04/2021 1710 by Elga Santy, Rollen Sox, RN Outcome: Adequate for Discharge 12/04/2021 0846 by Duncan Dull, RN Outcome: Progressing   Problem: Safety: Goal: Ability to remain free from injury will improve 12/04/2021 1710 by Ellyson Rarick, Rollen Sox, RN Outcome: Adequate for Discharge 12/04/2021 0846 by Duncan Dull, RN Outcome: Progressing   Problem: Skin Integrity: Goal: Risk for impaired skin integrity will decrease 12/04/2021 1710 by Ota Ebersole, Rollen Sox, RN Outcome: Adequate for Discharge 12/04/2021 0846 by Duncan Dull, RN Outcome: Progressing   Problem: Education: Goal: Knowledge of disease or condition will improve 12/04/2021 1710 by Dewanna Hurston, Rollen Sox, RN Outcome: Adequate for Discharge 12/04/2021 0846 by Duncan Dull, RN Outcome: Progressing Goal: Knowledge of secondary prevention will improve (MUST DOCUMENT ALL) 12/04/2021 1710 by Aizley Stenseth, Rollen Sox, RN Outcome: Adequate for Discharge 12/04/2021 0846 by Duncan Dull, RN Outcome: Progressing Goal: Knowledge of patient specific risk factors will improve Elta Gu[place] N/A or DELETE if not current risk factor) 12/04/2021 1710 by Daegon Deiss, Rollen Sox, RN Outcome: Adequate for  Discharge 12/04/2021 0846 by Duncan Dull, RN Outcome: Progressing   Problem: Ischemic Stroke/TIA Tissue Perfusion: Goal: Complications of ischemic stroke/TIA will be minimized 12/04/2021 1710 by Norinne Jeane, Rollen Sox, RN Outcome: Adequate for Discharge 12/04/2021 0846 by Duncan Dull, RN  Outcome: Progressing   Problem: Coping: Goal: Will verbalize positive feelings about self 12/04/2021 1710 by Estella Malatesta, Rollen Sox, RN Outcome: Adequate for Discharge 12/04/2021 0846 by Duncan Dull, RN Outcome: Progressing Goal: Will identify appropriate support needs 12/04/2021 1710 by Otto Felkins, Rollen Sox, RN Outcome: Adequate for Discharge 12/04/2021 0846 by Duncan Dull, RN Outcome: Progressing   Problem: Health Behavior/Discharge Planning: Goal: Ability to manage health-related needs will improve 12/04/2021 1710 by Andra Heslin, Rollen Sox, RN Outcome: Adequate for Discharge 12/04/2021 0846 by Duncan Dull, RN Outcome: Progressing Goal: Goals will be collaboratively established with patient/family 12/04/2021 1710 by Duncan Dull, RN Outcome: Adequate for Discharge 12/04/2021 0846 by Duncan Dull, RN Outcome: Progressing   Problem: Self-Care: Goal: Ability to participate in self-care as condition permits will improve 12/04/2021 1710 by Kamauri Denardo, Rollen Sox, RN Outcome: Adequate for Discharge 12/04/2021 0846 by Duncan Dull, RN Outcome: Progressing Goal: Verbalization of feelings and concerns over difficulty with self-care will improve 12/04/2021 1710 by Bryah Ocheltree, Rollen Sox, RN Outcome: Adequate for Discharge 12/04/2021 0846 by Duncan Dull, RN Outcome: Progressing Goal: Ability to communicate needs accurately will improve 12/04/2021 1710 by Elveta Rape, Rollen Sox, RN Outcome: Adequate for Discharge 12/04/2021 0846 by Duncan Dull, RN Outcome: Progressing   Problem: Nutrition: Goal: Risk of aspiration will decrease 12/04/2021 1710 by Leandrea Ackley, Rollen Sox,  RN Outcome: Adequate for Discharge 12/04/2021 0846 by Duncan Dull, RN Outcome: Progressing Goal: Dietary intake will improve 12/04/2021 1710 by Gilberto Stanforth, Rollen Sox, RN Outcome: Adequate for Discharge 12/04/2021 0846 by Duncan Dull, RN Outcome: Progressing

## 2021-12-04 NOTE — Plan of Care (Signed)
  Problem: Nutrition: Goal: Adequate nutrition will be maintained Outcome: Progressing   Problem: Coping: Goal: Level of anxiety will decrease Outcome: Progressing   Problem: Pain Managment: Goal: General experience of comfort will improve Outcome: Progressing   Problem: Safety: Goal: Ability to remain free from injury will improve Outcome: Progressing   Problem: Skin Integrity: Goal: Risk for impaired skin integrity will decrease Outcome: Progressing   Problem: Education: Goal: Knowledge of disease or condition will improve Outcome: Progressing   Problem: Ischemic Stroke/TIA Tissue Perfusion: Goal: Complications of ischemic stroke/TIA will be minimized Outcome: Progressing

## 2021-12-04 NOTE — Progress Notes (Signed)
  Echocardiogram Echocardiogram Transesophageal has been performed.  Bobbye Charleston 12/04/2021, 1:45 PM

## 2021-12-04 NOTE — Interval H&P Note (Signed)
History and Physical Interval Note:  12/04/2021 12:24 PM  James Jennings  has presented today for surgery, with the diagnosis of STROKE.  The various methods of treatment have been discussed with the patient and family. After consideration of risks, benefits and other options for treatment, the patient has consented to  Procedure(s): TRANSESOPHAGEAL ECHOCARDIOGRAM (TEE) (N/A) as a surgical intervention.  The patient's history has been reviewed, patient examined, no change in status, stable for surgery.  I have reviewed the patient's chart and labs.  Questions were answered to the patient's satisfaction.     Maeson Purohit Harrell Gave

## 2021-12-05 LAB — LUPUS ANTICOAGULANT PANEL
DRVVT: 34.1 s (ref 0.0–47.0)
PTT Lupus Anticoagulant: 30.5 s (ref 0.0–43.5)

## 2021-12-05 LAB — PROTEIN S ACTIVITY: Protein S Activity: 118 % (ref 63–140)

## 2021-12-05 LAB — PROTEIN C ACTIVITY: Protein C Activity: 132 % (ref 73–180)

## 2021-12-05 LAB — PROTEIN S, TOTAL: Protein S Ag, Total: 111 % (ref 60–150)

## 2021-12-06 LAB — PROTEIN C, TOTAL: Protein C, Total: 72 % (ref 60–150)

## 2021-12-07 ENCOUNTER — Encounter (HOSPITAL_COMMUNITY): Payer: Self-pay | Admitting: Cardiology

## 2021-12-08 ENCOUNTER — Telehealth: Payer: Self-pay

## 2021-12-08 NOTE — Telephone Encounter (Signed)
Rosalin Hawking, MD  Sherren Mocha, MD; Theodoro Parma, RN Cc: Early Osmond, MD Thank you. - Jindong       Previous Messages    ----- Message ----- From: Sherren Mocha, MD Sent: 12/05/2021   4:12 AM EDT To: Rosalin Hawking, MD; Theodoro Parma, RN; * Subject: RE: Inpatient Notes                            Sure. Will arrange consult with Arun or I. I reviewed the images and he has an atrial septal aneurysm. I agree the PFO and degree of shunt/bubbles appears small.  Thx Ronalee Belts ----- Message ----- From: Rosalin Hawking, MD Sent: 12/04/2021   5:55 PM EDT To: Sherren Mocha, MD Subject: Inpatient Notes                                Hi, Ronalee Belts: another young guy had left PCA infarct. TEE showed small PFO with aneurysmal septum. ROPE score 5-6. Will let you take a look. On DAPT now. Thank you - Jindong     Per Dr. Erlinda Hong, called to arrange PFO consult. Left message to call back.

## 2021-12-08 NOTE — Patient Outreach (Signed)
  Emmi Stroke Care Coordination Follow Up  12/08/2021 Name:  James Jennings MRN:  [id number] DOB:  1970/02/23  Subjective: James Jennings is a 51 y.o. year old male who is a primary care patient of [name], MD   An Emmi alert was received indicating patient responded to questions: Questions/problems with meds?.   I reached out by phone to follow up on the alert and spoke to Patient. Patient reports that he is doing well and making progress just not as fast as he would like. Reviewed and addressed red alert. Patient wanting to know if it was okay to take Plavix along with his other morning meds. Advised patient that this was okay and instructed him to eat prior to taking morning meds to prevent GI upset. He voiced understanding. Reviewed upcoming appts with patient per appt schedule in EMR. Patient will call PCP office today to make follow up appt. States his wife will be taking him to appts. Denies any RN CM needs or concerns at this time.   Care Coordination Interventions Activated:  Yes  Care Coordination Interventions:  Yes, provided   Follow up plan: No further intervention required. Advised patient that they would continue to get automated EMMI-Stroke post discharge calls to assess how they are doing following recent hospitalization and will receive a call from a nurse if any of their responses were abnormal. Patient voiced understanding and was appreciative of f/u call.   Encounter Outcome:  Pt. Visit Completed   Enzo Montgomery, RN,BSN,CCM [facility] Telephonic Care Management Coordinator Direct Phone: [phone] Toll Free: [phone] Fax: [phone]

## 2021-12-08 NOTE — Patient Outreach (Signed)
Received a red flag Emmi stroke notification for Mr. Garcon. I have assigned Enzo Montgomery, RN to call for follow up and determine if there are any Case Management needs.    [facility], [place], [place] [facility] [phone]

## 2021-12-08 NOTE — Telephone Encounter (Signed)
Scheduled the patient for PFO consult with Dr. Ali Lowe 12/11/2021. He was grateful for call and agreed with plan.

## 2021-12-09 ENCOUNTER — Other Ambulatory Visit: Payer: Self-pay

## 2021-12-09 ENCOUNTER — Telehealth: Payer: Self-pay

## 2021-12-09 LAB — FACTOR 5 LEIDEN

## 2021-12-10 ENCOUNTER — Other Ambulatory Visit: Payer: Self-pay

## 2021-12-10 ENCOUNTER — Ambulatory Visit: Payer: BC Managed Care – PPO | Attending: Neurology | Admitting: Speech Pathology

## 2021-12-10 DIAGNOSIS — R41841 Cognitive communication deficit: Secondary | ICD-10-CM | POA: Diagnosis not present

## 2021-12-10 LAB — PROTHROMBIN GENE MUTATION

## 2021-12-10 NOTE — Therapy (Signed)
OUTPATIENT SPEECH LANGUAGE PATHOLOGY EVALUATION   Patient Name: James Jennings MRN: [id number] DOB:10-Oct-1970, 51 y.o., male Today's Date: 12/10/2021  PCP: Dr. Bevelyn Jennings REFERRING PROVIDER: Dr. Rosalin Jennings   End of Session - 12/10/21 1405     Visit Number 1    Number of Visits 17    Date for SLP Re-Evaluation 02/04/22    Authorization Type BCBS    SLP Start Time 1058    SLP Stop Time  1146    SLP Time Calculation (min) 48 min    Activity Tolerance Patient tolerated treatment well             Past Medical History:  Diagnosis Date   Anxiety    Bilateral swelling of feet    Diabetes mellitus without complication ([name])    High cholesterol    Joint pain    Lactose intolerance    Large cell, follicular non-Hodgkin's lymphoma ([place])    Lymphoma, follicular ([place]) dx'd 10/9831   Multiple food allergies    Other fatigue    Palpitations    PONV (postoperative nausea and vomiting)    Pre-diabetes    Prediabetes    Shortness of breath on exertion    Sleep apnea    "dx'd ~ 2008; never AS'N mask" (02/28/2016)   Past Surgical History:  Procedure Laterality Date   ACHILLES TENDON SURGERY Left ~ 2012   APPENDECTOMY  11/12/2015   lap appy   BUBBLE STUDY  12/04/2021   Procedure: BUBBLE STUDY;  Surgeon: James Dresser, MD;  Location: [facility];  Service: Cardiovascular;;   CLUB FOOT RELEASE Bilateral [place] NODE BIOPSY Right 01/20/2016   Procedure: EXCISIONAL BIOPSY OF RIGHT INGUINAL LYMPH NODE;  Surgeon: James Pickerel, MD;  Location: [facility];  Service: General;  Laterality: Right;   IR IMAGING GUIDED PORT INSERTION  11/02/2019   LAPAROSCOPIC APPENDECTOMY N/A 11/12/2015   Procedure: APPENDECTOMY LAPAROSCOPIC;  Surgeon: James Pickerel, MD;  Location: [facility];  Service: General;  Laterality: N/A;   LYMPH NODE BIOPSY Left 03/20/2020   Procedure: EXCISIONAL BIOPSY LEFT INGUINAL LYMPH NODE;  Surgeon: James Keens, MD;  Location: [facility];   Service: General;  Laterality: Left;  LMA   SUTURE REMOVAL Left ~ 2016-2017 X 3   "had to use permanent sutures w/my achilles OR; my body rejects them at times & I have to have them surgically removed; under anesthesia"   TEE WITHOUT CARDIOVERSION N/A 12/04/2021   Procedure: TRANSESOPHAGEAL ECHOCARDIOGRAM (TEE);  Surgeon: James Dresser, MD;  Location: [facility];  Service: Cardiovascular;  Laterality: N/A;   VASECTOMY Bilateral 07/2019   [place]   Patient Active Problem List   Diagnosis Date Noted   Stroke (cerebrum) ([place]) 12/01/2021   Acute ischemic stroke ([place]) 12/01/2021   Palpitations 05/08/2021   Febrile illness    Septic shock ([place]) 05/39/7673   Follicular lymphoma grade III of intra-abdominal lymph nodes ([place]) 01/30/2016   Acute appendicitis with rupture 11/13/2015   Acute appendicitis 11/12/2015   Tobacco use 06/25/2011   ACHILLES BURSITIS OR TENDINITIS 10/28/2009    ONSET DATE: 12/01/2021   REFERRING DIAG: I63.9 (ICD-10-CM) - Cerebrovascular accident (CVA), unspecified mechanism ([place])   THERAPY DIAG:  Cognitive communication deficit  Rationale for Evaluation and Treatment: Rehabilitation  SUBJECTIVE:   SUBJECTIVE STATEMENT: "It's a lot harder to read"  Pt accompanied by: significant other  PERTINENT HISTORY:   HPI: Pt is a 51 y.o. male with a  PMH of B-cell lymphoma in remission, diabetes, hypertension, sleep apnea, who presented to [facility] with complaints of visual changes, headache and confusion. CT negative for acute findings, MRI brain: Acute infarct involving left occipital lobe cortex and hippocampal  tail in the PCA distribution.    PAIN:  Are you having pain? No  FALLS: Has patient fallen in last 6 months?  No  LIVING ENVIRONMENT: Lives with: lives with their spouse Lives in: House/apartment  PLOF:  Level of assistance: Independent  Employment: Producer, television/film/video, Passenger transport manager with [employer]  PATIENT GOALS: increased  ease of verbal expression and reading comprehension   OBJECTIVE:   DIAGNOSTIC FINDINGS: IMPRESSION: Acute infarct involving left occipital lobe cortex and hippocampal tail in the PCA distribution without hemorrhage or mass effect.  COGNITION: Overall cognitive status: Impaired Areas of impairment:  Attention: Impaired: Focused, Divided Executive function: Impaired: Slow processing Functional deficits: decreased    AUDITORY COMPREHENSION: Overall auditory comprehension: Appears intact YES/NO questions: Appears intact Following directions: Appears intact Conversation: Moderately Complex Interfering components: processing speed Effective technique: extra processing time  READING COMPREHENSION: Impaired: sentence and paragraph; decoding primary impairment which may be primary driver of increased difficulty with comprehension  EXPRESSION: verbal  VERBAL EXPRESSION: Level of generative/spontaneous verbalization: conversation Automatic speech: name: intact and social response: intact  Repetition: Appears intact Naming: Divergent:  x12 items beginning with letter "g" Pragmatics: Appears intact Comments: no paraphasias evidenced this date, reported to occur occasionally at home Interfering components: rare anomia Effective technique: open ended questions Non-verbal means of communication: N/A  WRITTEN EXPRESSION: Dominant hand: right Written expression: Not tested  MOTOR SPEECH: Overall motor speech: Appears intact  ORAL MOTOR EXAMINATION: Overall status: Did not assess  STANDARDIZED ASSESSMENTS: Cookie Theft Picture Description: Total content units 35 (WNL 221-44-9). X2 episodes of anomia, able to recover with questioning cues for description from [place] (PROM): "On a scale of 1-10, 1 being written words are jibberish 10 being fluently reading as you were before, how would you rate your reading skills now: pt selects "3," says "I am hard on  myself, do you think that's right?" SLP advises this is patient's perception and goal of therapy would be for his subjective perception of abilities would improve.    TODAY'S TREATMENT:   12-10-2021:    PATIENT EDUCATION: Education details: see above Person educated: Patient Education method: Customer service manager Education comprehension: verbalized understanding, returned demonstration, and needs further education   GOALS: Goals reviewed with patient? Yes  SHORT TERM GOALS: Target date: 01/07/2022  Pt will complete complex structured language tasks with 90% accuracy given rare min-A over 2 sessions Baseline: Goal status: INITIAL  2.  Pt will teach back anomia strategies and compensations following direct instruction with mod-I Baseline:  Goal status: INITIAL  3.  Pt will demonstrate anomia strategies at discourse level resulting in word retrieval in 90% of opportunities, with occasional min-A, over 30 minute discourse sample Baseline:  Goal status: INITIAL  4.  Pt will decode moderately complex words verbally with use of compensations PRN with 90% accuracy over 2 sessions Baseline:  Goal status: INITIAL   LONG TERM GOALS: Target date: 02/04/2022  Pt will fluently read 1 paragraph with no more than 2 occurences of needing to "sound out" a word with use of compensations PRN over 2 sessions Baseline:  Goal status: INITIAL  2.  Pt will increase subjective perception of reading comprehension from baseline of 3, to score of 6+ by d/c  Baseline:  Goal status: INITIAL  3.  Pt will IND employ anomia strategies at discourse level over 30 minute discourse sample, resulting in no occurences of inability to recover target word  Baseline:  Goal status: INITIAL   ASSESSMENT:  CLINICAL IMPRESSION: Patient is a 51 y.o. M who was seen today for cognitive linguistic evaluation s/p stroke. Pt presenting with impaired reading comprehension and mild aphasia. Pt has returned to  work and is reporting increased difficulty in execution of duties d/t aforementioned impairments. Suspect that role of occipital lobe in decoding grapheme representation and translating to phonemic representation accounting for ongoing decoding deficits. Increased difficulty decoding may be primary factor in overall reading comprehension. Skilled ST is recommended.    OBJECTIVE IMPAIRMENTS: include aphasia and reading comprehension . These impairments are limiting patient from return to work and effectively communicating at home and in community. Factors affecting potential to achieve goals and functional outcome are  none evidenced . Patient will benefit from skilled SLP services to address above impairments and improve overall function.  REHAB POTENTIAL: Excellent  PLAN:  SLP FREQUENCY: 1-2x/week (pt requests initiation at 1x week, will adjust based on pt's progress PRN)  SLP DURATION: 8 weeks  PLANNED INTERVENTIONS: Cognitive reorganization, Internal/external aids, Functional tasks, SLP instruction and feedback, Compensatory strategies, and Patient/family education    Su Monks, CCC-SLP 12/10/2021, 2:06 PM

## 2021-12-11 ENCOUNTER — Ambulatory Visit: Payer: BC Managed Care – PPO | Attending: Internal Medicine | Admitting: Internal Medicine

## 2021-12-11 ENCOUNTER — Encounter: Payer: Self-pay | Admitting: *Deleted

## 2021-12-11 ENCOUNTER — Encounter: Payer: Self-pay | Admitting: Internal Medicine

## 2021-12-11 ENCOUNTER — Other Ambulatory Visit: Payer: Self-pay | Admitting: Internal Medicine

## 2021-12-11 VITALS — BP 112/66 | HR 105 | Ht 71.75 in | Wt 274.4 lb

## 2021-12-11 DIAGNOSIS — E1169 Type 2 diabetes mellitus with other specified complication: Secondary | ICD-10-CM

## 2021-12-11 DIAGNOSIS — I700 Atherosclerosis of aorta: Secondary | ICD-10-CM

## 2021-12-11 DIAGNOSIS — E1159 Type 2 diabetes mellitus with other circulatory complications: Secondary | ICD-10-CM | POA: Diagnosis not present

## 2021-12-11 DIAGNOSIS — I152 Hypertension secondary to endocrine disorders: Secondary | ICD-10-CM

## 2021-12-11 DIAGNOSIS — E119 Type 2 diabetes mellitus without complications: Secondary | ICD-10-CM

## 2021-12-11 DIAGNOSIS — I639 Cerebral infarction, unspecified: Secondary | ICD-10-CM

## 2021-12-11 DIAGNOSIS — E785 Hyperlipidemia, unspecified: Secondary | ICD-10-CM

## 2021-12-11 DIAGNOSIS — Q2112 Patent foramen ovale: Secondary | ICD-10-CM

## 2021-12-11 DIAGNOSIS — I4891 Unspecified atrial fibrillation: Secondary | ICD-10-CM

## 2021-12-11 DIAGNOSIS — R000 Tachycardia, unspecified: Secondary | ICD-10-CM

## 2021-12-11 DIAGNOSIS — I253 Aneurysm of heart: Secondary | ICD-10-CM

## 2021-12-11 NOTE — Progress Notes (Signed)
Cardiology Office Note:    Date:  12/11/2021   ID:  James Jennings, DOB 31-Jan-1971, MRN [id number]  PCP:  James Lopes, MD   [facility] Providers Cardiologist:  [name], MD Referring MD: James Lopes, MD   Chief Complaint/Reason for Referral:  PFO in context of stroke  ASSESSMENT:    1. Acute ischemic stroke ([name])   2. PFO with atrial septal aneurysm   3. Type 2 diabetes mellitus without complication, without long-term current use of insulin (James Jennings)   4. Hypertension associated with diabetes (James Jennings)   5. Hyperlipidemia associated with type 2 diabetes mellitus (James Jennings)   6. Aortic atherosclerosis (James Jennings)     PLAN:    In order of problems listed above: 1.  Stroke: 2.  PFO: The patient has high risk anatomy with an intra-atrial septal aneurysm and PFO with a small associated ASD consistent with a fenestrated septum.  I reviewed all of his EKGs back till 2018 which do not demonstrate any atrial fibrillation.  Given his age and cardiovascular risk factors I think it best to rule out occult atrial fibrillation in this case.  I will refer him for a 30-day monitor.  If this shows no atrial fibrillation we will pursue transcatheter closure to decrease his risk of recurrent stroke. 3.  Type 2 diabetes: Continue aspirin, statin, and Farxiga.  Consider ACE or ARB in the future. 4.  Hypertension: 5.  Hyperlipidemia: Continue statin.  Goal LDL is now less than 55 given history of diabetes and stroke. 6.  Aortic atherosclerosis: Continue aspirin, statin, and strict blood pressure control.             Dispo:  Return in about 6 weeks (around 01/22/2022).      Medication Adjustments/Labs and Tests Ordered: Current medicines are reviewed at length with the patient today.  Concerns regarding medicines are outlined above.  The following changes have been made:  no change   Labs/tests ordered: Orders Placed This Encounter  Procedures   Cardiac event monitor    Medication  Changes: No orders of the defined types were placed in this encounter.    Current medicines are reviewed at length with the patient today.  The patient does not have concerns regarding medicines.   History of Present Illness:    FOCUSED PROBLEM LIST:   1.  Acute infarction of left occipital lobe treated with TNK October 2023 2.  B-cell lymphoma in remission; on maintenance immunotherapy with Gazyva 3.  Type 2 diabetes 4.  Hyperlipidemia 5.  BMI of 37 6.  Aortic atherosclerosis on PET scan 2023  The patient is a 51 y.o. male with the indicated medical history here for recommendations regarding PFO in the context of an acute stroke.  Patient was in his normal state of health up until October 30 of this year.  At that point in time he developed visual field changes and confusion.  Head CT was negative for bleed.  CTA showed no large vessel occlusions.  An MRI did demonstrate an embolic infarction of his left occipital lobe and hippocampus.  He underwent TNKase therapy.  Is work-up including hypercoagulable panel was negative.  Lower extremity Dopplers were negative.  The patient is here with his wife.  He is basically back to baseline.  He has some mild difficulties with cognitive function and word finding but overall he is almost back to baseline.  He has had no presyncope or syncope.  He has had palpitations on occasion over the last  several years.  He cannot really delineate how often this happens and they seem to be stress related at times.  He denies any exertional angina.  He has had no severe bleeding or bruising while on dual antiplatelet therapy.           Current Medications: Current Meds  Medication Sig   acetaminophen (TYLENOL) 325 MG tablet Take 650 mg by mouth daily as needed for moderate pain or headache.   aspirin EC 81 MG tablet Take 1 tablet (81 mg total) by mouth daily. Swallow whole.   atorvastatin (LIPITOR) 80 MG tablet Take 1 tablet (80 mg total) by mouth daily.    cetirizine (ZYRTEC) 10 MG tablet Take 10 mg by mouth daily.   clopidogrel (PLAVIX) 75 MG tablet Take 1 tablet (75 mg total) by mouth daily.   FARXIGA 10 MG TABS tablet Take 10 mg by mouth daily.   lidocaine-prilocaine (EMLA) cream Apply 1 application topically as needed. Apply to [facility] 1 hour prior to procedure.   metFORMIN (GLUCOPHAGE) 500 MG tablet Take 1 tablet (500 mg total) by mouth daily.   multivitamin (ONE-A-DAY MEN'S) TABS tablet Take 1 tablet by mouth daily.   polycarbophil (FIBERCON) 625 MG tablet Take 625 mg by mouth daily.     Allergies:    Mango flavor, Rituximab, Shellfish allergy, and Lactose intolerance (gi)   Social History:   Social History   Tobacco Use   Smoking status: Former    Packs/day: 1.00    Years: 30.00    Total pack years: 30.00    Types: Cigarettes    Quit date: 05/05/2017    Years since quitting: 4.6   Smokeless tobacco: Former    Types: Snuff    Quit date: 1997   Tobacco comments:    11/12/2015 "quit using chew in ~ 1997"  Vaping Use   Vaping Use: Never used  Substance Use Topics   Alcohol use: Not Currently   Drug use: Not Currently    Types: Marijuana    Comment: "in the early 1990s; recreational"     Family Hx: Family History  Problem Relation Age of Onset   Colon cancer Neg Hx    Colon polyps Neg Hx    Esophageal cancer Neg Hx    Stomach cancer Neg Hx    Rectal cancer Neg Hx      Review of Systems:   Please see the history of present illness.    All other systems reviewed and are negative.     EKGs/Labs/Other Test Reviewed:    EKG:  EKG performed November 2023 that I personally reviewed demonstrates sinus rhythm; today that I personally reviewed demonstrates normal sinus rhythm with nonspecific T wave changes.  I reviewed all of the patient's EKGs back in 2018 and none demonstrated atrial fibrillation.  Prior CV studies:  TEE October 2023: The report for this study is pending however I reviewed this TEE which demonstrates an  intra-atrial septal aneurysm and PFO with an associated small ASD.  Ejection fraction is normal with no significant valvular abnormalities.   TTE October 2023: 1. Left ventricular ejection fraction, by estimation, is 60 to 65%. The  left ventricle has normal function. The left ventricle has no regional  wall motion abnormalities. Left ventricular diastolic parameters were  normal.   2. Right ventricular systolic function is normal. The right ventricular  size is normal. Tricuspid regurgitation signal is inadequate for assessing  PA pressure.   3. The mitral valve is grossly normal.  No evidence of mitral valve  regurgitation. No evidence of mitral stenosis.   4. The aortic valve was not well visualized. Aortic valve regurgitation  is not visualized. No aortic stenosis is present.   5. The inferior vena cava is normal in size with greater than 50%  respiratory variability, suggesting right atrial pressure of 3 mmHg.    Other studies Reviewed: Review of the additional studies/records demonstrates:   Head MRI 2023: Acute infarct involving left occipital lobe cortex and hippocampal tail in the PCA distribution without hemorrhage or mass effect.  CT angio head and neck 2023: Patent vasculature of the head and neck with no hemodynamically significant stenosis or occlusion.  Head CT 2023: 1. No acute intracranial pathology.   PET scan 2023 with aortic atherosclerosis  Recent Labs: 12/02/2021: ALT 25; BUN 9; Creatinine, Ser 1.07; Hemoglobin 12.6; Platelets 187; Potassium 4.2; Sodium 141   Recent Lipid Panel Lab Results  Component Value Date/Time   CHOL 132 12/02/2021 04:23 AM   CHOL 171 12/18/2020 08:17 AM   TRIG 92 12/02/2021 04:23 AM   HDL 37 (L) 12/02/2021 04:23 AM   HDL 42 12/18/2020 08:17 AM   LDLCALC 77 12/02/2021 04:23 AM   LDLCALC 110 (H) 12/18/2020 08:17 AM    Risk Assessment/Calculations:                Physical Exam:    VS:  BP 112/66   Pulse (!) 105    Ht 5' 11.75" (1.822 m)   Wt 274 lb 6.4 oz (124.5 kg)   SpO2 97%   BMI 37.48 kg/m    Wt Readings from Last 3 Encounters:  12/11/21 274 lb 6.4 oz (124.5 kg)  12/04/21 272 lb (123.4 kg)  10/20/21 274 lb (124.3 kg)    GENERAL:  No apparent distress, AOx3 HEENT:  No carotid bruits, +2 carotid impulses, no scleral icterus CAR: RRR no murmurs, gallops, rubs, or thrills RES:  Clear to auscultation bilaterally ABD:  Soft, nontender, nondistended, positive bowel sounds x 4 VASC:  +2 radial pulses, +2 carotid pulses, palpable pedal pulses NEURO:  CN 2-12 grossly intact; motor and sensory grossly intact PSYCH:  No active depression or anxiety EXT:  No edema, ecchymosis, or cyanosis  Signed, Early Osmond, MD  12/11/2021 3:11 PM    [place], [place], [place]  [address] Phone: [phone]; Fax: [phone]   Note:  This document was prepared using Dragon voice recognition software and may include unintentional dictation errors.

## 2021-12-11 NOTE — Progress Notes (Signed)
Patient enrolled for [employer] to ship a 30 day cardiac event monitor to his address on file.

## 2021-12-11 NOTE — Patient Instructions (Addendum)
Medication Instructions:  No changes *If you need a refill on your cardiac medications before your next appointment, please call your pharmacy*   Lab Work: none If you have labs (blood work) drawn today and your tests are completely normal, you will receive your results only by: [facility] [place] (if you have MyChart) OR A paper copy in the mail If you have any lab test that is abnormal or we need to change your treatment, we will call you to review the results.   Testing/Procedures: Your physician has recommended that you wear an event monitor. Event monitors are medical devices that record the heart's electrical activity. Doctors most often [place] these monitors to diagnose arrhythmias. Arrhythmias are problems with the speed or rhythm of the heartbeat. The monitor is a small, portable device. You can wear one while you do your normal daily activities. This is usually used to diagnose what is causing palpitations/syncope (passing out).   Follow-Up: At [facility], you and your health needs are our priority.  As part of our continuing mission to provide you with exceptional heart care, we have created designated Provider Care Teams.  These Care Teams include your primary Cardiologist (physician) and Advanced Practice Providers (APPs -  Physician Assistants and Nurse Practitioners) who all work together to provide you with the care you need, when you need it.   Your next appointment:   6-8 week(s)  The format for your next appointment:   In Person  Provider:   [name], MD   [employer] Cardiac Event Monitor Instructions Your physician has requested you wear your cardiac event monitor for 30 days. [employer] may call or text to confirm a shipping address. The monitor will be sent to a land address via UPS. [employer] will not ship a monitor to a PO BOX. It typically takes 3-5 days to receive your monitor after it has been enrolled. [employer] will assist with USPS tracking  if your package is delayed. The telephone number for [employer] is [phone]. Once you have received your monitor, please review the enclosed instructions. Instruction tutorials can also be viewed under help and settings on the enclosed cell phone. Your monitor has already been registered assigning a specific monitor serial # to you.  Applying the monitor Remove cell phone from case and turn it on. The cell phone works as Dealer and needs to be within [employer] of you at all times. The cell phone will need to be charged on a daily basis. We recommend you plug the cell phone into the enclosed charger at your bedside table every night.  Monitor batteries: You will receive two monitor batteries labelled #1 and #2. These are your recorders. Plug battery #2 onto the second connection on the enclosed charger. Keep one battery on the charger at all times. This will keep the monitor battery deactivated. It will also keep it fully charged for when you need to switch your monitor batteries. A small light will be blinking on the battery emblem when it is charging. The light on the battery emblem will remain on when the battery is fully charged.  Open package of a Monitor strip. Insert battery #1 into black hood on strip and gently squeeze monitor battery onto connection as indicated in instruction booklet. Set aside while preparing skin.  Choose location for your strip, vertical or horizontal, as indicated in the instruction booklet. Shave to remove all hair from location. There cannot be any lotions, oils, powders, or colognes on skin where monitor  is to be applied. Wipe skin clean with enclosed Saline wipe. Dry skin completely.  Peel paper labeled #1 off the back of the Monitor strip exposing the adhesive. Place the monitor on the chest in the vertical or horizontal position shown in the instruction booklet. One arrow on the monitor strip must be pointing upward. Carefully remove paper  labeled #2, attaching remainder of strip to your skin. Try not to create any folds or wrinkles in the strip as you apply it.  Firmly press and release the circle in the center of the monitor battery. You will hear a small beep. This is turning the monitor battery on. The heart emblem on the monitor battery will light up every 5 seconds if the monitor battery in turned on and connected to the patient securely. Do not push and hold the circle down as this turns the monitor battery off. The cell phone will locate the monitor battery. A screen will appear on the cell phone checking the connection of your monitor strip. This may read poor connection initially but change to good connection within the next minute. Once your monitor accepts the connection you will hear a series of 3 beeps followed by a climbing crescendo of beeps. A screen will appear on the cell phone showing the two monitor strip placement options. Touch the picture that demonstrates where you applied the monitor strip.  Your monitor strip and battery are waterproof. You are able to shower, bathe, or swim with the monitor on. They just ask you do not submerge deeper than 3 feet underwater. We recommend removing the monitor if you are swimming in a lake, river, or ocean.  Your monitor battery will need to be switched to a fully charged monitor battery approximately once a week. The cell phone will alert you of an action which needs to be made.  On the cell phone, tap for details to reveal connection status, monitor battery status, and cell phone battery status. The green dots indicates your monitor is in good status. A red dot indicates there is something that needs your attention.  To record a symptom, click the circle on the monitor battery. In 30-60 seconds a list of symptoms will appear on the cell phone. Select your symptom and tap save. Your monitor will record a sustained or significant arrhythmia regardless of you clicking  the button. Some patients do not feel the heart rhythm irregularities. [employer] will notify us of any serious or critical events.  Refer to instruction booklet for instructions on switching batteries, changing strips, the Do not disturb or Pause features, or any additional questions.  Call [employer] at [phone], to confirm your monitor is transmitting and record your baseline. They will answer any questions you may have regarding the monitor instructions at that time.  Returning the monitor to [place] all equipment back into blue box. Peel off strip of paper to expose adhesive and close box securely. There is a prepaid UPS shipping label on this box. Drop in a UPS drop box, or at a UPS facility like [employer]. You may also contact [employer] to arrange UPS to pick up monitor package at your home.   Important Information About [employer]

## 2021-12-12 ENCOUNTER — Other Ambulatory Visit: Payer: Self-pay

## 2021-12-12 DIAGNOSIS — E119 Type 2 diabetes mellitus without complications: Secondary | ICD-10-CM | POA: Diagnosis not present

## 2021-12-12 NOTE — Addendum Note (Signed)
Addended by: Janan Halter F on: 12/12/2021 05:40 PM   Modules accepted: Orders

## 2021-12-15 DIAGNOSIS — I639 Cerebral infarction, unspecified: Secondary | ICD-10-CM | POA: Diagnosis not present

## 2021-12-15 NOTE — Addendum Note (Signed)
Addended by: Danielle Dess on: 12/15/2021 09:09 AM   Modules accepted: Orders

## 2021-12-16 DIAGNOSIS — H53481 Generalized contraction of visual field, right eye: Secondary | ICD-10-CM | POA: Diagnosis not present

## 2021-12-16 NOTE — Therapy (Unsigned)
OUTPATIENT SPEECH LANGUAGE PATHOLOGY TREATMENT   Patient Name: James Jennings MRN: [id number] DOB:07/26/1970, 51 y.o., male Today's Date: 12/17/2021  PCP: Dr. Bevelyn Buckles REFERRING PROVIDER: Dr. Rosalin Hawking   End of Session - 12/17/21 0800     Visit Number 2    Number of Visits 17    Date for SLP Re-Evaluation 02/04/22    Authorization Type BCBS    SLP Start Time 0800    SLP Stop Time  0844    SLP Time Calculation (min) 44 min    Activity Tolerance Patient tolerated treatment well              Past Medical History:  Diagnosis Date   Anxiety    Bilateral swelling of feet    Diabetes mellitus without complication ([name])    High cholesterol    Joint pain    Lactose intolerance    Large cell, follicular non-Hodgkin's lymphoma ([place])    Lymphoma, follicular ([place]) dx'd 25/0539   Multiple food allergies    Other fatigue    Palpitations    PONV (postoperative nausea and vomiting)    Pre-diabetes    Prediabetes    Shortness of breath on exertion    Sleep apnea    "dx'd ~ 2008; never JQ'B mask" (02/28/2016)   Past Surgical History:  Procedure Laterality Date   ACHILLES TENDON SURGERY Left ~ 2012   APPENDECTOMY  11/12/2015   lap appy   BUBBLE STUDY  12/04/2021   Procedure: BUBBLE STUDY;  Surgeon: Buford Dresser, MD;  Location: [facility];  Service: Cardiovascular;;   CLUB FOOT RELEASE Bilateral [place] NODE BIOPSY Right 01/20/2016   Procedure: EXCISIONAL BIOPSY OF RIGHT INGUINAL LYMPH NODE;  Surgeon: Greer Pickerel, MD;  Location: [facility];  Service: General;  Laterality: Right;   IR IMAGING GUIDED PORT INSERTION  11/02/2019   LAPAROSCOPIC APPENDECTOMY N/A 11/12/2015   Procedure: APPENDECTOMY LAPAROSCOPIC;  Surgeon: Greer Pickerel, MD;  Location: [facility];  Service: General;  Laterality: N/A;   LYMPH NODE BIOPSY Left 03/20/2020   Procedure: EXCISIONAL BIOPSY LEFT INGUINAL LYMPH NODE;  Surgeon: Coralie Keens, MD;  Location: [facility];   Service: General;  Laterality: Left;  LMA   SUTURE REMOVAL Left ~ 2016-2017 X 3   "had to use permanent sutures w/my achilles OR; my body rejects them at times & I have to have them surgically removed; under anesthesia"   TEE WITHOUT CARDIOVERSION N/A 12/04/2021   Procedure: TRANSESOPHAGEAL ECHOCARDIOGRAM (TEE);  Surgeon: Buford Dresser, MD;  Location: [facility];  Service: Cardiovascular;  Laterality: N/A;   VASECTOMY Bilateral 07/2019   [place]   Patient Active Problem List   Diagnosis Date Noted   Stroke (cerebrum) ([place]) 12/01/2021   Acute ischemic stroke ([place]) 12/01/2021   Palpitations 05/08/2021   Febrile illness    Septic shock ([place]) 34/19/3790   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 male with  a PMH of B-cell lymphoma in remission, diabetes, hypertension, sleep apnea, who presented to [facility] long with complaints of visual changes, headache and confusion. CT negative for acute findings, MRI brain: Acute infarct involving left occipital lobe cortex and hippocampal  tail in the PCA distribution.    PAIN:  Are you having pain? No  FALLS: Has patient fallen in last 6 months?  No  LIVING ENVIRONMENT: Lives with: lives with their spouse Lives in: House/apartment  PLOF:  Level of assistance: Independent  Employment: Producer, television/film/video, Passenger transport manager with  [employer]  PATIENT GOALS: increased ease of verbal expression and reading comprehension   OBJECTIVE:   TODAY'S TREATMENT:   12-17-2021: Endorsed significant improvements in word retrieval compared to initial evaluation. Pt exhibited anomia x2 in extended conversation today, in which pt able to self-correct with mild extra processing time. Mild ongoing difficulty reading reported, particularly more complex verbiage. Re-reading effective to aid comprehension for more technical work terms and concepts. Targeted reading novel passage ([place] passage) aloud with 95% accuracy achieved with rare micro pauses exhibited to aid word recognition. Pt able to identify and decode all words without SLP assistance. SLP educated and cued use of attention strategies, including visual tracking, and performance pressure management techniques to aid accuracy, which was effective. HEP includes reading first chapter of technical handbook. Recommended patient write/read/say any noted words requiring additional processing time to optimize formation of lexical connections.    PATIENT EDUCATION: Education details: see above Person educated: Patient Education method: Customer service manager Education comprehension: verbalized understanding, returned demonstration, and needs further education   GOALS: Goals reviewed with patient? Yes  SHORT TERM GOALS: Target date: 01/07/2022  Pt will complete complex structured language tasks with 90% accuracy given rare min-A over 2 sessions Baseline: Goal status: IN PROGRESS  2.  Pt will teach back anomia strategies and compensations following direct instruction with mod-I Baseline:  Goal status: IN PROGRESS  3.  Pt will demonstrate anomia strategies at discourse level resulting in word retrieval in 90% of opportunities, with occasional min-A, over 30 minute discourse sample Baseline: 12-17-21 Goal status: IN PROGRESS  4.  Pt will decode moderately complex words  verbally with use of compensations PRN with 90% accuracy over 2 sessions Baseline: 12-17-21 Goal status: IN PROGRESS   LONG TERM GOALS: Target date: 02/04/2022  Pt will fluently read 1 paragraph with no more than 2 occurences of needing to "sound out" a word with use of compensations PRN over 2 sessions Baseline:  Goal status: IN PROGRESS  2.  Pt will increase subjective perception of reading comprehension from baseline of 3, to score of 6+ by d/c  Baseline:  Goal status: IN PROGRESS  3.  Pt will IND employ anomia strategies at discourse level over 30 minute discourse sample, resulting in no occurences of inability to recover target word  Baseline:  Goal status: IN PROGRESS   ASSESSMENT:  CLINICAL IMPRESSION: Patient is a 51 y.o. M who was seen today for cognitive linguistic evaluation s/p stroke. Pt presenting with impaired reading comprehension and mild aphasia. Pt has returned to work and is reporting increased difficulty in execution of duties d/t aforementioned impairments. Suspect that role of occipital lobe in decoding grapheme representation and translating to phonemic representation accounting for ongoing decoding deficits. Increased difficulty decoding may be primary factor in overall reading comprehension. Skilled ST is recommended.    OBJECTIVE IMPAIRMENTS: include aphasia and reading comprehension . These impairments are limiting patient from return to work and effectively communicating at  home and in community. Factors affecting potential to achieve goals and functional outcome are  none evidenced . Patient will benefit from skilled SLP services to address above impairments and improve overall function.  REHAB POTENTIAL: Excellent  PLAN:  SLP FREQUENCY: 1-2x/week (pt requests initiation at 1x week, will adjust based on pt's progress PRN)  SLP DURATION: 8 weeks  PLANNED INTERVENTIONS: Cognitive reorganization, Internal/external aids, Functional tasks, SLP instruction  and feedback, Compensatory strategies, and Patient/family education    Marzetta Board, CCC-SLP 12/17/2021, 8:47 AM

## 2021-12-17 ENCOUNTER — Ambulatory Visit: Payer: BC Managed Care – PPO

## 2021-12-17 DIAGNOSIS — R41841 Cognitive communication deficit: Secondary | ICD-10-CM | POA: Diagnosis not present

## 2021-12-17 NOTE — Patient Instructions (Addendum)
Re-reading to understand is okay!  Tracking with your finger, index card, or another paper  Goal: read first chapter and synopsis  Write down any terms you have to sound on or look up meaning

## 2021-12-18 ENCOUNTER — Ambulatory Visit: Payer: BC Managed Care – PPO | Attending: Internal Medicine

## 2021-12-18 DIAGNOSIS — I639 Cerebral infarction, unspecified: Secondary | ICD-10-CM | POA: Diagnosis not present

## 2021-12-18 DIAGNOSIS — I4891 Unspecified atrial fibrillation: Secondary | ICD-10-CM | POA: Diagnosis not present

## 2021-12-18 DIAGNOSIS — R000 Tachycardia, unspecified: Secondary | ICD-10-CM

## 2021-12-19 ENCOUNTER — Encounter: Payer: Self-pay | Admitting: Hematology & Oncology

## 2021-12-19 ENCOUNTER — Inpatient Hospital Stay: Payer: BC Managed Care – PPO

## 2021-12-19 ENCOUNTER — Inpatient Hospital Stay: Payer: BC Managed Care – PPO | Attending: Hematology & Oncology

## 2021-12-19 ENCOUNTER — Inpatient Hospital Stay (HOSPITAL_BASED_OUTPATIENT_CLINIC_OR_DEPARTMENT_OTHER): Payer: BC Managed Care – PPO | Admitting: Hematology & Oncology

## 2021-12-19 VITALS — HR 90

## 2021-12-19 VITALS — BP 100/78 | HR 100 | Temp 98.7°F | Resp 17 | Ht 71.0 in | Wt 268.0 lb

## 2021-12-19 DIAGNOSIS — C8223 Follicular lymphoma grade III, unspecified, intra-abdominal lymph nodes: Secondary | ICD-10-CM

## 2021-12-19 DIAGNOSIS — Z7902 Long term (current) use of antithrombotics/antiplatelets: Secondary | ICD-10-CM | POA: Diagnosis not present

## 2021-12-19 DIAGNOSIS — Q2112 Patent foramen ovale: Secondary | ICD-10-CM | POA: Diagnosis not present

## 2021-12-19 DIAGNOSIS — Z7982 Long term (current) use of aspirin: Secondary | ICD-10-CM | POA: Insufficient documentation

## 2021-12-19 DIAGNOSIS — Z5112 Encounter for antineoplastic immunotherapy: Secondary | ICD-10-CM | POA: Diagnosis not present

## 2021-12-19 DIAGNOSIS — Z8673 Personal history of transient ischemic attack (TIA), and cerebral infarction without residual deficits: Secondary | ICD-10-CM | POA: Diagnosis not present

## 2021-12-19 DIAGNOSIS — Z9221 Personal history of antineoplastic chemotherapy: Secondary | ICD-10-CM | POA: Insufficient documentation

## 2021-12-19 LAB — CBC WITH DIFFERENTIAL (CANCER CENTER ONLY)
Abs Immature Granulocytes: 0.02 10*3/uL (ref 0.00–0.07)
Basophils Absolute: 0 10*3/uL (ref 0.0–0.1)
Basophils Relative: 1 %
Eosinophils Absolute: 0.2 10*3/uL (ref 0.0–0.5)
Eosinophils Relative: 3 %
HCT: 40.9 % (ref 39.0–52.0)
Hemoglobin: 13.9 g/dL (ref 13.0–17.0)
Immature Granulocytes: 0 %
Lymphocytes Relative: 16 %
Lymphs Abs: 1 10*3/uL (ref 0.7–4.0)
MCH: 31.4 pg (ref 26.0–34.0)
MCHC: 34 g/dL (ref 30.0–36.0)
MCV: 92.5 fL (ref 80.0–100.0)
Monocytes Absolute: 0.7 10*3/uL (ref 0.1–1.0)
Monocytes Relative: 11 %
Neutro Abs: 4.1 10*3/uL (ref 1.7–7.7)
Neutrophils Relative %: 69 %
Platelet Count: 230 10*3/uL (ref 150–400)
RBC: 4.42 MIL/uL (ref 4.22–5.81)
RDW: 12.5 % (ref 11.5–15.5)
WBC Count: 6 10*3/uL (ref 4.0–10.5)
nRBC: 0 % (ref 0.0–0.2)

## 2021-12-19 LAB — CMP (CANCER CENTER ONLY)
ALT: 21 U/L (ref 0–44)
AST: 20 U/L (ref 15–41)
Albumin: 4.4 g/dL (ref 3.5–5.0)
Alkaline Phosphatase: 138 U/L — ABNORMAL HIGH (ref 38–126)
Anion gap: 10 (ref 5–15)
BUN: 13 mg/dL (ref 6–20)
CO2: 28 mmol/L (ref 22–32)
Calcium: 9.7 mg/dL (ref 8.9–10.3)
Chloride: 102 mmol/L (ref 98–111)
Creatinine: 1.09 mg/dL (ref 0.61–1.24)
GFR, Estimated: >60 mL/min (ref >60)
Glucose, Bld: 159 mg/dL — ABNORMAL HIGH (ref 70–99)
Potassium: 4 mmol/L (ref 3.5–5.1)
Sodium: 140 mmol/L (ref 135–145)
Total Bilirubin: 0.8 mg/dL (ref 0.3–1.2)
Total Protein: 6.9 g/dL (ref 6.5–8.1)

## 2021-12-19 LAB — LACTATE DEHYDROGENASE: LDH: 150 U/L (ref 98–192)

## 2021-12-19 MED ORDER — HEPARIN SOD (PORK) LOCK FLUSH 100 UNIT/ML IV SOLN
500.0000 [IU] | Freq: Once | INTRAVENOUS | Status: AC | PRN
  Administered 2021-12-19: 500 [IU]

## 2021-12-19 MED ORDER — DIPHENHYDRAMINE HCL 50 MG/ML IJ SOLN
12.5000 mg | Freq: Once | INTRAMUSCULAR | Status: AC
  Administered 2021-12-19: 12.5 mg via INTRAVENOUS
  Filled 2021-12-19: qty 1

## 2021-12-19 MED ORDER — SODIUM CHLORIDE 0.9 % IV SOLN: Freq: Once | INTRAVENOUS | Status: AC

## 2021-12-19 MED ORDER — SODIUM CHLORIDE 0.9% FLUSH
10.0000 mL | INTRAVENOUS | Status: DC | PRN
  Administered 2021-12-19: 10 mL

## 2021-12-19 MED ORDER — SODIUM CHLORIDE 0.9% FLUSH
10.0000 mL | Freq: Once | INTRAVENOUS | Status: AC
  Administered 2021-12-19: 10 mL

## 2021-12-19 MED ORDER — SODIUM CHLORIDE 0.9 % IV SOLN
40.0000 mg | Freq: Once | INTRAVENOUS | Status: AC
  Administered 2021-12-19: 40 mg via INTRAVENOUS
  Filled 2021-12-19: qty 4

## 2021-12-19 MED ORDER — ACETAMINOPHEN 325 MG PO TABS
650.0000 mg | ORAL_TABLET | Freq: Once | ORAL | Status: AC
  Administered 2021-12-19: 650 mg via ORAL
  Filled 2021-12-19: qty 2

## 2021-12-19 MED ORDER — SODIUM CHLORIDE 0.9 % IV SOLN
1000.0000 mg | Freq: Once | INTRAVENOUS | Status: AC
  Administered 2021-12-19: 1000 mg via INTRAVENOUS
  Filled 2021-12-19: qty 40

## 2021-12-19 NOTE — Patient Instructions (Addendum)
Obinutuzumab Injection What is this medication? OBINUTUZUMAB (OH bi nue TOOZ ue mab) treats leukemia and lymphoma. It works by blocking a protein that causes cancer cells to grow and multiply. This helps to slow or stop the spread of cancer cells. It is a monoclonal antibody. This medicine may be used for other purposes; ask your health care provider or pharmacist if you have questions. COMMON BRAND NAME(S): GAZYVA What should I tell my care team before I take this medication? They need to know if you have any of these conditions: Heart disease Infection, especially a viral infection, such as hepatitis B Lung or breathing disease Take medications that treat or prevent blood clots An unusual or allergic reaction to obinutuzumab, other medications, foods, dyes, or preservatives Pregnant or trying to get pregnant Breastfeeding How should I use this medication? This medication is for infusion into a vein. It is given by a care team in a hospital or clinic setting. Talk to your care team about the use of this medication in children. Special care may be needed. Overdosage: If you think you have taken too much of this medicine contact a poison control center or emergency room at once. NOTE: This medicine is only for you. Do not share this medicine with others. What if I miss a dose? Keep appointments for follow-up doses as directed. It is important not to miss your dose. Call your care team if you are unable to keep an appointment. What may interact with this medication? Live virus vaccines This list may not describe all possible interactions. Give your health care provider a list of all the medicines, herbs, non-prescription drugs, or dietary supplements you use. Also tell them if you smoke, drink alcohol, or use illegal drugs. Some items may interact with your medicine. What should I watch for while using this medication? Report any side effects that you notice during your treatment right away,  such as changes in your breathing, fever, chills, dizziness or lightheadedness. These effects are more common with the first dose. Visit your care team for checks on your progress. You will need to have regular blood work. Report any other side effects. The side effects of this medication can continue after you finish your treatment. Continue your course of treatment even though you feel ill unless your care team tells you to stop. Call your care team for advice if you get a fever, chills or sore throat, or other symptoms of a cold or flu. Do not treat yourself. This medication decreases your body's ability to fight infections. Try to avoid being around people who are sick. This medication may increase your risk to bruise or bleed. Call your care team if you notice any unusual bleeding. Do not become pregnant while taking this medication or for 6 months after stopping it. Inform your care team if you wish to become pregnant or think you might be pregnant. There is a potential for serious side effects to an unborn child. Talk to your care team or pharmacist for more information. Do not breast-feed an infant while taking this medication or for 6 months after stopping it. What side effects may I notice from receiving this medication? Side effects that you should report to your care team as soon as possible: Allergic reactions--skin rash, itching, hives, swelling of the face, lips, tongue, or throat Bleeding--bloody or black, tar-like stools, vomiting blood or brown material that looks like coffee grounds, red or dark brown urine, small red or purple spots on skin, unusual bruising  or bleeding Blood clot--pain, swelling, or warmth in the leg, shortness of breath, chest pain Dizziness, loss of balance or coordination, confusion or trouble speaking Infection--fever, chills, cough, sore throat, wounds that don't heal, pain or trouble when passing urine, general feeling of discomfort or being unwell Infusion  reactions--chest pain, shortness of breath or trouble breathing, feeling faint or lightheaded Liver injury--right upper belly pain, loss of appetite, nausea, light-colored stool, dark yellow or brown urine, yellowing skin or eyes, unusual weakness or fatigue Tumor lysis syndrome (TLS)--nausea, vomiting, diarrhea, decrease in the amount of urine, dark urine, unusual weakness or fatigue, confusion, muscle pain or cramps, fast or irregular heartbeat, joint pain Side effects that usually do not require medical attention (report to your care team if they continue or are bothersome): Bone, joint, or muscle pain Constipation Diarrhea Fatigue Runny or stuffy nose Sore throat This list may not describe all possible side effects. Call your doctor for medical advice about side effects. You may report side effects to FDA at 1-800-FDA-1088. Where should I keep my medication? This medication is only given in a hospital or clinic and will not be stored at home. NOTE: This sheet is a summary. It may not cover all possible information. If you have questions about this medicine, talk to your doctor, pharmacist, or health care provider.  2023 Elsevier/Gold Standard (2021-06-03 00:00:00) [facility]  Discharge Instructions: Thank you for choosing [facility] to provide your oncology and hematology care.   If you have a lab appointment with the [facility], please go directly to the [facility] and check in at the registration area.  Wear comfortable clothing and clothing appropriate for easy access to any Portacath or PICC line.   We strive to give you quality time with your provider. You may need to reschedule your appointment if you arrive late (15 or more minutes).  Arriving late affects you and other patients whose appointments are after yours.  Also, if you miss three or more appointments without notifying the office, you may be dismissed from the clinic at the  provider's discretion.      For prescription refill requests, have your pharmacy contact our office and allow 72 hours for refills to be completed.    Today you received the following chemotherapy and/or immunotherapy agents gazyva     To help prevent nausea and vomiting after your treatment, we encourage you to take your nausea medication as directed.  BELOW ARE SYMPTOMS THAT SHOULD BE REPORTED IMMEDIATELY: *FEVER GREATER THAN 100.4 F (38 C) OR HIGHER *CHILLS OR SWEATING *NAUSEA AND VOMITING THAT IS NOT CONTROLLED WITH YOUR NAUSEA MEDICATION *UNUSUAL SHORTNESS OF BREATH *UNUSUAL BRUISING OR BLEEDING *URINARY PROBLEMS (pain or burning when urinating, or frequent urination) *BOWEL PROBLEMS (unusual diarrhea, constipation, pain near the anus) TENDERNESS IN MOUTH AND THROAT WITH OR WITHOUT PRESENCE OF ULCERS (sore throat, sores in mouth, or a toothache) UNUSUAL RASH, SWELLING OR PAIN  UNUSUAL VAGINAL DISCHARGE OR ITCHING   Items with * indicate a potential emergency and should be followed up as soon as possible or go to the Emergency Department if any problems should occur.  Please show the CHEMOTHERAPY ALERT CARD or IMMUNOTHERAPY ALERT CARD at check-in to the Emergency Department and triage nurse. Should you have questions after your visit or need to cancel or reschedule your appointment, please contact [facility]  [phone] and follow the prompts.  Office hours are 8:00 a.m. to  4:30 p.m. Monday - Friday. Please note that voicemails left after 4:00 p.m. may not be returned until the following business day.  We are closed weekends and major holidays. You have access to a nurse at all times for urgent questions. Please call the main number to the clinic [phone] and follow the prompts.  For any non-urgent questions, you may also contact your provider using MyChart. We now offer e-Visits for anyone 25 and older to request care online for non-urgent symptoms.  For details visit [email or web address].   Also download the MyChart app! Go to the app store, search "MyChart", open the app, select [facility], and log in with your MyChart username and password.  Masks are optional in the cancer centers. If you would like for your care team to wear a mask while they are taking care of you, please let them know. You may have one support person who is at least 53 years old accompany you for your appointments.

## 2021-12-19 NOTE — Progress Notes (Signed)
Hematology and Oncology Follow Up Visit  James Jennings [id number] [birth year] 51 y.o. 12/19/2021   Principle Diagnosis:  Follicular B- cell non-Hodgkin's lymphoma - Relapsed   Past Therapy:             Rituxan/bendamustine-s/p cycle 6 - completed on 07/03/2016   Current Therapy:  Gazyva  - Cytoxan/ Vincristine/Prednisone  - started 11/07/2019, s/p cycle #4 --  D/c on 03/26/2020 G-CHOP -- s/p cycle #6-- started on 04/03/2020 -completed on 07/13/2020 Maintenance Gazyva-s/p cycle 7/12 --  start on 09/05/2020    Interim History:  [name] is here today for follow-up.  The big news is that he was hospitalized at the end of October.  He actually had a stroke.  He had a MRI of the brain which showed an acute stroke in the left occipital lobe and hippocampal tail.  Surprising, he had a TEE which did show that he had a patent foramen ovale.  He has a cardiac monitor on right now.  They want to make sure there is no atrial fibrillation.  If there is no atrial fibrillation, then they will repair the PFO.  He is under quite a bit of stress.  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  Several of his team were laid off.  He is going to try to have a good Thanksgiving.  It sounds like he will be traveling around [place] seeing family.  He has had no problems with fever.  He has had no bleeding.  He is on aspirin and Plavix.  He has had no change in bowel or bladder habits.  He has not noted any swollen lymph nodes.  Overall, I would say his performance status is probably ECOG 1.   Medications:  Allergies as of 12/19/2021       Reactions   Mango Flavor Swelling, Other (See Comments)   LIPS SWELL   Rituximab Other (See Comments)   Blood pressure went extremely low.    Shellfish Allergy Anaphylaxis   Lactose Intolerance (gi) Diarrhea        Medication List        Accurate as of December 19, 2021  8:50 AM. If you have any questions, ask your nurse or  doctor.          acetaminophen 325 MG tablet Commonly known as: TYLENOL Take 650 mg by mouth daily as needed for moderate pain or headache.   ALPRAZolam 0.25 MG tablet Commonly known as: XANAX Take 0.25 mg by mouth 2 (two) times daily as needed.   aspirin EC 81 MG tablet Take 1 tablet (81 mg total) by mouth daily. Swallow whole.   atorvastatin 80 MG tablet Commonly known as: LIPITOR Take 1 tablet (80 mg total) by mouth daily.   cetirizine 10 MG tablet Commonly known as: ZYRTEC Take 10 mg by mouth daily.   clopidogrel 75 MG tablet Commonly known as: PLAVIX Take 1 tablet (75 mg total) by mouth daily.   Farxiga 10 MG Tabs tablet Generic drug: dapagliflozin propanediol Take 10 mg by mouth daily.   lidocaine-prilocaine cream Commonly known as: EMLA Apply 1 application topically as needed. Apply to [facility] 1 hour prior to procedure.   metFORMIN 500 MG tablet Commonly known as: GLUCOPHAGE Take 1 tablet (500 mg total) by mouth daily.   multivitamin Tabs tablet Take 1 tablet by mouth daily.   polycarbophil 625 MG tablet Commonly known as: FIBERCON Take 625 mg by mouth daily.  Allergies:  Allergies  Allergen Reactions   Mango Flavor Swelling and Other (See Comments)    LIPS SWELL   Rituximab Other (See Comments)    Blood pressure went extremely low.    Shellfish Allergy Anaphylaxis   Lactose Intolerance (Gi) Diarrhea    Past Medical History, Surgical history, Social history, and Family History were reviewed and updated.  Review of Systems: Review of Systems  Constitutional: Negative.   HENT: Negative.    Eyes: Negative.   Respiratory: Negative.    Cardiovascular: Negative.   Gastrointestinal: Negative.   Genitourinary: Negative.   Musculoskeletal: Negative.   Skin: Negative.   Neurological: Negative.   Endo/Heme/Allergies: Negative.   Psychiatric/Behavioral: Negative.       Physical Exam:  height is '5\' 11"'$  (1.803 m) and weight is 268 lb  (121.6 kg). His oral temperature is 98.7 F (37.1 C). His blood pressure is 100/78 and his pulse is 100. His respiration is 17 and oxygen saturation is 98%.   Wt Readings from Last 3 Encounters:  12/19/21 268 lb (121.6 kg)  12/11/21 274 lb 6.4 oz (124.5 kg)  12/04/21 272 lb (123.4 kg)  His vital signs are temperature of 98.3.  Pulse 86.  Blood pressure 109/76.  Weight is 274 pounds.  Physical Exam Vitals reviewed.  HENT:     Head: Normocephalic and atraumatic.  Eyes:     Pupils: Pupils are equal, round, and reactive to light.  Cardiovascular:     Rate and Rhythm: Normal rate and regular rhythm.     Heart sounds: Normal heart sounds.  Pulmonary:     Effort: Pulmonary effort is normal.     Breath sounds: Normal breath sounds.  Abdominal:     General: Bowel sounds are normal.     Palpations: Abdomen is soft.  Musculoskeletal:        General: No tenderness or deformity. Normal range of motion.     Cervical back: Normal range of motion.  Lymphadenopathy:     Cervical: No cervical adenopathy.  Skin:    General: Skin is warm and dry.     Findings: No erythema or rash.  Neurological:     Mental Status: He is alert and oriented to person, place, and time.  Psychiatric:        Behavior: Behavior normal.        Thought Content: Thought content normal.        Judgment: Judgment normal.      Lab Results  Component Value Date   WBC 6.0 12/19/2021   HGB 13.9 12/19/2021   HCT 40.9 12/19/2021   MCV 92.5 12/19/2021   PLT 230 12/19/2021   No results found for: "FERRITIN", "IRON", "TIBC", "UIBC", "IRONPCTSAT" Lab Results  Component Value Date   RETICCTPCT 1.07 12/06/2015   RBC 4.42 12/19/2021   RETICCTABS 48.79 12/06/2015   No results found for: "KPAFRELGTCHN", "LAMBDASER", "KAPLAMBRATIO" Lab Results  Component Value Date   IGGSERUM 644 12/25/2020   IGA 137 12/25/2020   IGMSERUM 17 (L) 12/25/2020   Lab Results  Component Value Date   TOTALPROTELP 6.1 12/25/2020    ALBUMINELP 3.7 12/25/2020   A1GS 0.2 12/25/2020   A2GS 0.7 12/25/2020   BETS 1.0 12/25/2020   GAMS 0.5 12/25/2020   MSPIKE Not Observed 12/25/2020     Chemistry      Component Value Date/Time   NA 141 12/02/2021 0423   NA 139 12/18/2020 0817   NA 141 12/03/2016 1155   NA 141 02/07/2016 1149  K 4.2 12/02/2021 0423   K 3.8 12/03/2016 1155   K 4.3 02/07/2016 1149   CL 108 12/02/2021 0423   CL 100 12/03/2016 1155   CO2 23 12/02/2021 0423   CO2 27 12/03/2016 1155   CO2 27 02/07/2016 1149   BUN 9 12/02/2021 0423   BUN 16 12/18/2020 0817   BUN 10 12/03/2016 1155   BUN 10.9 02/07/2016 1149   CREATININE 1.07 12/02/2021 0423   CREATININE 1.08 10/20/2021 0813   CREATININE 1.0 12/03/2016 1155   CREATININE 0.9 02/07/2016 1149      Component Value Date/Time   CALCIUM 8.6 (L) 12/02/2021 0423   CALCIUM 9.1 12/03/2016 1155   CALCIUM 9.6 02/07/2016 1149   ALKPHOS 102 12/02/2021 0423   ALKPHOS 99 (H) 12/03/2016 1155   ALKPHOS 127 02/07/2016 1149   AST 24 12/02/2021 0423   AST 18 10/20/2021 0813   AST 17 02/07/2016 1149   ALT 25 12/02/2021 0423   ALT 19 10/20/2021 0813   ALT 24 12/03/2016 1155   ALT 16 02/07/2016 1149   BILITOT 0.8 12/02/2021 0423   BILITOT 0.6 10/20/2021 0813   BILITOT 0.42 02/07/2016 1149       Impression and Plan: Mr. Liberto is a pleasant 51 yo caucasian gentleman with history of follicular large cell non-Hodgkin's lymphoma. He completed 6 cycles of chemotherapy with bendamustine on 07/03/2016 but was not tolerant of Rituxan (anaphylaxis). His lymphoma has  recurred.  We then treated him with CHOP.  This got him into remission.  He is on maintenance Gazyva now.  We will go ahead with his 8th cycle of treatment.  He will have a total of 12 cycles.  We are going every 2 months.  I hate that he had this CVA.  Looks like he is recovered quite nicely from it.  Again, he still will need some work from cardiology.  We will plan for follow-up in 2 months.  I am sure  that by then, the PFO will have been fixed.  [name], MD 11/17/20238:50 AM

## 2021-12-19 NOTE — Patient Instructions (Signed)

## 2021-12-20 ENCOUNTER — Other Ambulatory Visit: Payer: Self-pay

## 2021-12-20 LAB — IGG, IGA, IGM
IgA: 133 mg/dL (ref 90–386)
IgG (Immunoglobin G), Serum: 585 mg/dL — ABNORMAL LOW (ref 603–1613)
IgM (Immunoglobulin M), Srm: 12 mg/dL — ABNORMAL LOW (ref 20–172)

## 2021-12-23 NOTE — Therapy (Deleted)
OUTPATIENT SPEECH LANGUAGE PATHOLOGY TREATMENT   Patient Name: James Jennings MRN: [id number] DOB:Apr 12, 1970, 51 y.o., male Today's Date: 12/23/2021  PCP: Dr. Bevelyn Buckles REFERRING PROVIDER: Dr. Rosalin Hawking      Past Medical History:  Diagnosis Date   Anxiety    Bilateral swelling of feet    Diabetes mellitus without complication ([place])    High cholesterol    Joint pain    Lactose intolerance    Large cell, follicular non-Hodgkin's lymphoma ([place])    Lymphoma, follicular ([place]) dx'd 48/2500   Multiple food allergies    Other fatigue    Palpitations    PONV (postoperative nausea and vomiting)    Pre-diabetes    Prediabetes    Shortness of breath on exertion    Sleep apnea    "dx'd ~ 2008; never BB'C mask" (02/28/2016)   Past Surgical History:  Procedure Laterality Date   ACHILLES TENDON SURGERY Left ~ 2012   APPENDECTOMY  11/12/2015   lap appy   BUBBLE STUDY  12/04/2021   Procedure: BUBBLE STUDY;  Surgeon: Buford Dresser, MD;  Location: [facility];  Service: Cardiovascular;;   CLUB FOOT RELEASE Bilateral [place] LYMPH NODE BIOPSY Right 01/20/2016   Procedure: EXCISIONAL BIOPSY OF RIGHT INGUINAL LYMPH NODE;  Surgeon: Greer Pickerel, MD;  Location: [facility];  Service: General;  Laterality: Right;   IR IMAGING GUIDED PORT INSERTION  11/02/2019   LAPAROSCOPIC APPENDECTOMY N/A 11/12/2015   Procedure: APPENDECTOMY LAPAROSCOPIC;  Surgeon: Greer Pickerel, MD;  Location: [facility];  Service: General;  Laterality: N/A;   LYMPH NODE BIOPSY Left 03/20/2020   Procedure: EXCISIONAL BIOPSY LEFT INGUINAL LYMPH NODE;  Surgeon: Coralie Keens, MD;  Location: [facility];  Service: General;  Laterality: Left;  LMA   SUTURE REMOVAL Left ~ 2016-2017 X 3   "had to use permanent sutures w/my achilles OR; my body rejects them at times & I have to have them surgically removed; under anesthesia"   TEE WITHOUT CARDIOVERSION N/A 12/04/2021   Procedure: TRANSESOPHAGEAL  ECHOCARDIOGRAM (TEE);  Surgeon: Buford Dresser, MD;  Location: [facility];  Service: Cardiovascular;  Laterality: N/A;   VASECTOMY Bilateral 07/2019   [place]   Patient Active Problem List   Diagnosis Date Noted   Stroke (cerebrum) ([place]) 12/01/2021   Acute ischemic stroke ([place]) 12/01/2021   Palpitations 05/08/2021   Febrile illness    Septic shock ([place]) 48/88/9169   Follicular lymphoma grade III of intra-abdominal lymph nodes ([place]) 01/30/2016   Acute appendicitis with rupture 11/13/2015   Acute appendicitis 11/12/2015   Tobacco use 06/25/2011   ACHILLES BURSITIS OR TENDINITIS 10/28/2009    ONSET DATE: 12/01/2021   REFERRING DIAG: I63.9 (ICD-10-CM) - Cerebrovascular accident (CVA), unspecified mechanism ([place])   THERAPY DIAG: No diagnosis found.  Rationale for Evaluation and Treatment: Rehabilitation  SUBJECTIVE:   SUBJECTIVE STATEMENT: "*** Pt accompanied by: significant other  PERTINENT HISTORY:   HPI: Pt is a 51 y.o. male with a PMH of B-cell lymphoma in remission, diabetes, hypertension, sleep apnea, who presented [facility] with complaints of visual changes, headache and confusion. CT negative for acute findings, MRI brain: Acute infarct involving left occipital lobe cortex and hippocampal  tail in the PCA distribution.    PAIN:  Are you having pain? No  FALLS: Has patient fallen in last 6 months?  No  LIVING ENVIRONMENT: Lives with: lives with their spouse Lives in: House/apartment  PLOF:  Level of  assistance: Independent  Employment: Producer, television/film/video, Passenger transport manager with [employer]  PATIENT GOALS: increased ease of verbal expression and reading comprehension   OBJECTIVE:   TODAY'S TREATMENT:   12-24-21: ***  12-17-2021: Endorsed significant improvements in word retrieval compared to initial evaluation. Pt exhibited anomia x2 in extended conversation today, in which pt able to self-correct with mild extra processing time. Mild  ongoing difficulty reading reported, particularly more complex verbiage. Re-reading effective to aid comprehension for more technical work terms and concepts. Targeted reading novel passage (Rainbow passage) aloud with 95% accuracy achieved with rare micro pauses exhibited to aid word recognition. Pt able to identify and decode all words without SLP assistance. SLP educated and cued use of attention strategies, including visual tracking, and performance pressure management techniques to aid accuracy, which was effective. HEP includes reading first chapter of technical handbook. Recommended patient write/read/say any noted words requiring additional processing time to optimize formation of lexical connections.    PATIENT EDUCATION: Education details: see above Person educated: Patient Education method: Customer service manager Education comprehension: verbalized understanding, returned demonstration, and needs further education   GOALS: Goals reviewed with patient? Yes  SHORT TERM GOALS: Target date: 01/07/2022  Pt will complete complex structured language tasks with 90% accuracy given rare min-A over 2 sessions Baseline: Goal status: IN PROGRESS  2.  Pt will teach back anomia strategies and compensations following direct instruction with mod-I Baseline:  Goal status: IN PROGRESS  3.  Pt will demonstrate anomia strategies at discourse level resulting in word retrieval in 90% of opportunities, with occasional min-A, over 30 minute discourse sample Baseline: 12-17-21 Goal status: IN PROGRESS  4.  Pt will decode moderately complex words verbally with use of compensations PRN with 90% accuracy over 2 sessions Baseline: 12-17-21 Goal status: IN PROGRESS   LONG TERM GOALS: Target date: 02/04/2022  Pt will fluently read 1 paragraph with no more than 2 occurences of needing to "sound out" a word with use of compensations PRN over 2 sessions Baseline:  Goal status: IN PROGRESS  2.  Pt  will increase subjective perception of reading comprehension from baseline of 3, to score of 6+ by d/c  Baseline:  Goal status: IN PROGRESS  3.  Pt will IND employ anomia strategies at discourse level over 30 minute discourse sample, resulting in no occurences of inability to recover target word  Baseline:  Goal status: IN PROGRESS   ASSESSMENT:  CLINICAL IMPRESSION: Patient is a 51 y.o. M who was seen today for cognitive linguistic evaluation s/p stroke. Pt presenting with impaired reading comprehension and mild aphasia. Pt has returned to work and is reporting increased difficulty in execution of duties d/t aforementioned impairments. Suspect that role of occipital lobe in decoding grapheme representation and translating to phonemic representation accounting for ongoing decoding deficits. Increased difficulty decoding may be primary factor in overall reading comprehension. Skilled ST is recommended.    OBJECTIVE IMPAIRMENTS: include aphasia and reading comprehension . These impairments are limiting patient from return to work and effectively communicating at home and in community. Factors affecting potential to achieve goals and functional outcome are  none evidenced . Patient will benefit from skilled SLP services to address above impairments and improve overall function.  REHAB POTENTIAL: Excellent  PLAN:  SLP FREQUENCY: 1-2x/week (pt requests initiation at 1x week, will adjust based on pt's progress PRN)  SLP DURATION: 8 weeks  PLANNED INTERVENTIONS: Cognitive reorganization, Internal/external aids, Functional tasks, SLP instruction and feedback, Compensatory strategies, and Patient/family education  Marzetta Board, CCC-SLP 12/23/2021, 3:08 PM

## 2021-12-24 ENCOUNTER — Ambulatory Visit: Payer: BC Managed Care – PPO

## 2021-12-30 NOTE — Therapy (Deleted)
OUTPATIENT SPEECH LANGUAGE PATHOLOGY TREATMENT   Patient Name: James Jennings MRN: [id number] DOB:18-Jun-1970, 51 y.o., male Today's Date: 12/30/2021  PCP: Dr. Bevelyn Buckles REFERRING PROVIDER: Dr. Rosalin Hawking      Past Medical History:  Diagnosis Date   Anxiety    Bilateral swelling of feet    Diabetes mellitus without complication ([place])    High cholesterol    Joint pain    Lactose intolerance    Large cell, follicular non-Hodgkin's lymphoma ([place])    Lymphoma, follicular ([place]) dx'd 73/2202   Multiple food allergies    Other fatigue    Palpitations    PONV (postoperative nausea and vomiting)    Pre-diabetes    Prediabetes    Shortness of breath on exertion    Sleep apnea    "dx'd ~ 2008; never RK'Y mask" (02/28/2016)   Past Surgical History:  Procedure Laterality Date   ACHILLES TENDON SURGERY Left ~ 2012   APPENDECTOMY  11/12/2015   lap appy   BUBBLE STUDY  12/04/2021   Procedure: BUBBLE STUDY;  Surgeon: Buford Dresser, MD;  Location: [facility];  Service: Cardiovascular;;   CLUB FOOT RELEASE Bilateral [place] LYMPH NODE BIOPSY Right 01/20/2016   Procedure: EXCISIONAL BIOPSY OF RIGHT INGUINAL LYMPH NODE;  Surgeon: Greer Pickerel, MD;  Location: [facility];  Service: General;  Laterality: Right;   IR IMAGING GUIDED PORT INSERTION  11/02/2019   LAPAROSCOPIC APPENDECTOMY N/A 11/12/2015   Procedure: APPENDECTOMY LAPAROSCOPIC;  Surgeon: Greer Pickerel, MD;  Location: [facility];  Service: General;  Laterality: N/A;   LYMPH NODE BIOPSY Left 03/20/2020   Procedure: EXCISIONAL BIOPSY LEFT INGUINAL LYMPH NODE;  Surgeon: Coralie Keens, MD;  Location: [facility];  Service: General;  Laterality: Left;  LMA   SUTURE REMOVAL Left ~ 2016-2017 X 3   "had to use permanent sutures w/my achilles OR; my body rejects them at times & I have to have them surgically removed; under anesthesia"   TEE WITHOUT CARDIOVERSION N/A 12/04/2021   Procedure: TRANSESOPHAGEAL  ECHOCARDIOGRAM (TEE);  Surgeon: Buford Dresser, MD;  Location: [facility];  Service: Cardiovascular;  Laterality: N/A;   VASECTOMY Bilateral 07/2019   [place]   Patient Active Problem List   Diagnosis Date Noted   Stroke (cerebrum) ([place]) 12/01/2021   Acute ischemic stroke ([place]) 12/01/2021   Palpitations 05/08/2021   Febrile illness    Septic shock ([place]) 70/62/3762   Follicular lymphoma grade III of intra-abdominal lymph nodes ([place]) 01/30/2016   Acute appendicitis with rupture 11/13/2015   Acute appendicitis 11/12/2015   Tobacco use 06/25/2011   ACHILLES BURSITIS OR TENDINITIS 10/28/2009    ONSET DATE: 12/01/2021   REFERRING DIAG: I63.9 (ICD-10-CM) - Cerebrovascular accident (CVA), unspecified mechanism ([place])   THERAPY DIAG: No diagnosis found.  Rationale for Evaluation and Treatment: Rehabilitation  SUBJECTIVE:   SUBJECTIVE STATEMENT: "*** Pt accompanied by: self  PERTINENT HISTORY:   HPI: Pt is a 51 y.o. male with a PMH of B-cell lymphoma in remission, diabetes, hypertension, sleep apnea, who presented to [facility] long with complaints of visual changes, headache and confusion. CT negative for acute findings, MRI brain: Acute infarct involving left occipital lobe cortex and hippocampal  tail in the PCA distribution.    PAIN: Are you having pain? No  FALLS: Has patient fallen in last 6 months?  No  PATIENT GOALS: increased ease of verbal expression and reading comprehension   OBJECTIVE:   TODAY'S TREATMENT:  12-30-21: ***  12-17-2021: Endorsed significant improvements in word retrieval compared to initial evaluation. Pt exhibited anomia x2 in extended conversation today, in which pt able to self-correct with mild extra processing time. Mild ongoing difficulty reading reported, particularly more complex verbiage. Re-reading effective to aid comprehension for more technical work terms and concepts. Targeted reading novel passage (Rainbow  passage) aloud with 95% accuracy achieved with rare micro pauses exhibited to aid word recognition. Pt able to identify and decode all words without SLP assistance. SLP educated and cued use of attention strategies, including visual tracking, and performance pressure management techniques to aid accuracy, which was effective. HEP includes reading first chapter of technical handbook. Recommended patient write/read/say any noted words requiring additional processing time to optimize formation of lexical connections.    PATIENT EDUCATION: Education details: see above Person educated: Patient Education method: Customer service manager Education comprehension: verbalized understanding, returned demonstration, and needs further education   GOALS: Goals reviewed with patient? Yes  SHORT TERM GOALS: Target date: 01/07/2022  Pt will complete complex structured language tasks with 90% accuracy given rare min-A over 2 sessions Baseline: Goal status: IN PROGRESS  2.  Pt will teach back anomia strategies and compensations following direct instruction with mod-I Baseline:  Goal status: IN PROGRESS  3.  Pt will demonstrate anomia strategies at discourse level resulting in word retrieval in 90% of opportunities, with occasional min-A, over 30 minute discourse sample Baseline: 12-17-21 Goal status: IN PROGRESS  4.  Pt will decode moderately complex words verbally with use of compensations PRN with 90% accuracy over 2 sessions Baseline: 12-17-21 Goal status: IN PROGRESS   LONG TERM GOALS: Target date: 02/04/2022  Pt will fluently read 1 paragraph with no more than 2 occurences of needing to "sound out" a word with use of compensations PRN over 2 sessions Baseline:  Goal status: IN PROGRESS  2.  Pt will increase subjective perception of reading comprehension from baseline of 3, to score of 6+ by d/c  Baseline:  Goal status: IN PROGRESS  3.  Pt will IND employ anomia strategies at discourse  level over 30 minute discourse sample, resulting in no occurences of inability to recover target word  Baseline:  Goal status: IN PROGRESS   ASSESSMENT:  CLINICAL IMPRESSION: Patient is a 51 y.o. M who was seen today for cognitive linguistic evaluation s/p stroke. Pt presenting with impaired reading comprehension and mild aphasia. Pt has returned to work and is reporting increased difficulty in execution of duties d/t aforementioned impairments. Suspect that role of occipital lobe in decoding grapheme representation and translating to phonemic representation accounting for ongoing decoding deficits. Increased difficulty decoding may be primary factor in overall reading comprehension. Skilled ST is recommended.    OBJECTIVE IMPAIRMENTS: include aphasia and reading comprehension . These impairments are limiting patient from return to work and effectively communicating at home and in community. Factors affecting potential to achieve goals and functional outcome are  none evidenced . Patient will benefit from skilled SLP services to address above impairments and improve overall function.  REHAB POTENTIAL: Excellent  PLAN:  SLP FREQUENCY: 1-2x/week (pt requests initiation at 1x week, will adjust based on pt's progress PRN)  SLP DURATION: 8 weeks  PLANNED INTERVENTIONS: Cognitive reorganization, Internal/external aids, Functional tasks, SLP instruction and feedback, Compensatory strategies, and Patient/family education    Marzetta Board, CCC-SLP 12/30/2021, 1:56 PM

## 2021-12-31 ENCOUNTER — Ambulatory Visit: Payer: BC Managed Care – PPO

## 2022-01-01 ENCOUNTER — Other Ambulatory Visit: Payer: Self-pay

## 2022-01-01 DIAGNOSIS — R197 Diarrhea, unspecified: Secondary | ICD-10-CM | POA: Diagnosis not present

## 2022-01-01 DIAGNOSIS — K529 Noninfective gastroenteritis and colitis, unspecified: Secondary | ICD-10-CM | POA: Diagnosis not present

## 2022-01-06 NOTE — Therapy (Unsigned)
OUTPATIENT SPEECH LANGUAGE PATHOLOGY TREATMENT   Patient Name: James Jennings MRN: [id number] DOB:05/03/1970, 51 y.o., male Today's Date: 01/06/2022  PCP: Dr. Bevelyn Buckles REFERRING PROVIDER: Dr. Rosalin Hawking      Past Medical History:  Diagnosis Date   Anxiety    Bilateral swelling of feet    Diabetes mellitus without complication ([place])    High cholesterol    Joint pain    Lactose intolerance    Large cell, follicular non-Hodgkin's lymphoma ([place])    Lymphoma, follicular ([place]) dx'd 00/8676   Multiple food allergies    Other fatigue    Palpitations    PONV (postoperative nausea and vomiting)    Pre-diabetes    Prediabetes    Shortness of breath on exertion    Sleep apnea    "dx'd ~ 2008; never PP'J mask" (02/28/2016)   Past Surgical History:  Procedure Laterality Date   ACHILLES TENDON SURGERY Left ~ 2012   APPENDECTOMY  11/12/2015   lap appy   BUBBLE STUDY  12/04/2021   Procedure: BUBBLE STUDY;  Surgeon: Buford Dresser, MD;  Location: [place];  Service: Cardiovascular;;   CLUB FOOT RELEASE Bilateral [place] LYMPH NODE BIOPSY Right 01/20/2016   Procedure: EXCISIONAL BIOPSY OF RIGHT INGUINAL LYMPH NODE;  Surgeon: Greer Pickerel, MD;  Location: [place];  Service: General;  Laterality: Right;   IR IMAGING GUIDED PORT INSERTION  11/02/2019   LAPAROSCOPIC APPENDECTOMY N/A 11/12/2015   Procedure: APPENDECTOMY LAPAROSCOPIC;  Surgeon: Greer Pickerel, MD;  Location: [place];  Service: General;  Laterality: N/A;   LYMPH NODE BIOPSY Left 03/20/2020   Procedure: EXCISIONAL BIOPSY LEFT INGUINAL LYMPH NODE;  Surgeon: Coralie Keens, MD;  Location: [place];  Service: General;  Laterality: Left;  LMA   SUTURE REMOVAL Left ~ 2016-2017 X 3   "had to use permanent sutures w/my achilles OR; my body rejects them at times & I have to have them surgically removed; under anesthesia"   TEE WITHOUT CARDIOVERSION N/A 12/04/2021   Procedure: TRANSESOPHAGEAL  ECHOCARDIOGRAM (TEE);  Surgeon: Buford Dresser, MD;  Location: [place];  Service: Cardiovascular;  Laterality: N/A;   VASECTOMY Bilateral 07/2019   [place]   Patient Active Problem List   Diagnosis Date Noted   Stroke (cerebrum) ([place]) 12/01/2021   Acute ischemic stroke ([place]) 12/01/2021   Palpitations 05/08/2021   Febrile illness    Septic shock ([place]) 09/32/6712   Follicular lymphoma grade III of intra-abdominal lymph nodes ([place]) 01/30/2016   Acute appendicitis with rupture 11/13/2015   Acute appendicitis 11/12/2015   Tobacco use 06/25/2011   ACHILLES BURSITIS OR TENDINITIS 10/28/2009    ONSET DATE: 12/01/2021   REFERRING DIAG: I63.9 (ICD-10-CM) - Cerebrovascular accident (CVA), unspecified mechanism ([place])   THERAPY DIAG: No diagnosis found.  Rationale for Evaluation and Treatment: Rehabilitation  SUBJECTIVE:   SUBJECTIVE STATEMENT: "*** Pt accompanied by: self  PERTINENT HISTORY:   HPI: Pt is a 51 y.o. male with a PMH of B-cell lymphoma in remission, diabetes, hypertension, sleep apnea, who presented to [facility] with complaints of visual changes, headache and confusion. CT negative for acute findings, MRI brain: Acute infarct involving left occipital lobe cortex and hippocampal  tail in the PCA distribution.    PAIN: Are you having pain? No  FALLS: Has patient fallen in last 6 months?  No  PATIENT GOALS: increased ease of verbal expression and reading comprehension   OBJECTIVE:   TODAY'S TREATMENT:  01-06-22: ***  12-17-2021: Endorsed significant improvements in word retrieval compared to initial evaluation. Pt exhibited anomia x2 in extended conversation today, in which pt able to self-correct with mild extra processing time. Mild ongoing difficulty reading reported, particularly more complex verbiage. Re-reading effective to aid comprehension for more technical work terms and concepts. Targeted reading novel passage (Rainbow  passage) aloud with 95% accuracy achieved with rare micro pauses exhibited to aid word recognition. Pt able to identify and decode all words without SLP assistance. SLP educated and cued use of attention strategies, including visual tracking, and performance pressure management techniques to aid accuracy, which was effective. HEP includes reading first chapter of technical handbook. Recommended patient write/read/say any noted words requiring additional processing time to optimize formation of lexical connections.    PATIENT EDUCATION: Education details: see above Person educated: Patient Education method: Customer service manager Education comprehension: verbalized understanding, returned demonstration, and needs further education   GOALS: Goals reviewed with patient? Yes  SHORT TERM GOALS: Target date: 01/07/2022  Pt will complete complex structured language tasks with 90% accuracy given rare min-A over 2 sessions Baseline: Goal status: IN PROGRESS  2.  Pt will teach back anomia strategies and compensations following direct instruction with mod-I Baseline:  Goal status: IN PROGRESS  3.  Pt will demonstrate anomia strategies at discourse level resulting in word retrieval in 90% of opportunities, with occasional min-A, over 30 minute discourse sample Baseline: 12-17-21 Goal status: IN PROGRESS  4.  Pt will decode moderately complex words verbally with use of compensations PRN with 90% accuracy over 2 sessions Baseline: 12-17-21 Goal status: IN PROGRESS   LONG TERM GOALS: Target date: 02/04/2022  Pt will fluently read 1 paragraph with no more than 2 occurences of needing to "sound out" a word with use of compensations PRN over 2 sessions Baseline:  Goal status: IN PROGRESS  2.  Pt will increase subjective perception of reading comprehension from baseline of 3, to score of 6+ by d/c  Baseline:  Goal status: IN PROGRESS  3.  Pt will IND employ anomia strategies at discourse  level over 30 minute discourse sample, resulting in no occurences of inability to recover target word  Baseline:  Goal status: IN PROGRESS   ASSESSMENT:  CLINICAL IMPRESSION: Patient is a 51 y.o. M who was seen today for cognitive linguistic evaluation s/p stroke. Pt presenting with impaired reading comprehension and mild aphasia. Pt has returned to work and is reporting increased difficulty in execution of duties d/t aforementioned impairments. Suspect that role of occipital lobe in decoding grapheme representation and translating to phonemic representation accounting for ongoing decoding deficits. Increased difficulty decoding may be primary factor in overall reading comprehension. Skilled ST is recommended.    OBJECTIVE IMPAIRMENTS: include aphasia and reading comprehension . These impairments are limiting patient from return to work and effectively communicating at home and in community. Factors affecting potential to achieve goals and functional outcome are  none evidenced . Patient will benefit from skilled SLP services to address above impairments and improve overall function.  REHAB POTENTIAL: Excellent  PLAN:  SLP FREQUENCY: 1-2x/week (pt requests initiation at 1x week, will adjust based on pt's progress PRN)  SLP DURATION: 8 weeks  PLANNED INTERVENTIONS: Cognitive reorganization, Internal/external aids, Functional tasks, SLP instruction and feedback, Compensatory strategies, and Patient/family education    Marzetta Board, CCC-SLP 01/06/2022, 11:58 AM

## 2022-01-07 ENCOUNTER — Ambulatory Visit: Payer: BC Managed Care – PPO | Attending: Neurology

## 2022-01-07 DIAGNOSIS — R41841 Cognitive communication deficit: Secondary | ICD-10-CM | POA: Insufficient documentation

## 2022-01-12 ENCOUNTER — Telehealth: Payer: Self-pay

## 2022-01-12 NOTE — Progress Notes (Unsigned)
Office Visit    Patient Name: James Jennings Date of Encounter: 01/12/2022  Primary Care Provider:  [name], MD Primary Cardiologist:  None Primary Electrophysiologist: None  Chief Complaint    James Jennings is a 51 y.o. male with PMH of left PCA ischemic CVA, PFO with atrial septal aneurysm, HTN, aortic atherosclerosis, non-Hodgkin's lymphoma disease in remission), hyperlipidemia, DM type II, sleep apnea who presents today for VT alert seen on 30-day event monitor.  Past Medical History    Past Medical History:  Diagnosis Date   Anxiety    Bilateral swelling of feet    Diabetes mellitus without complication ([name])    High cholesterol    Joint pain    Lactose intolerance    Large cell, follicular non-Hodgkin's lymphoma ([name])    Lymphoma, follicular ([place]) dx'd 69/6295   Multiple food allergies    Other fatigue    Palpitations    PONV (postoperative nausea and vomiting)    Pre-diabetes    Prediabetes    Shortness of breath on exertion    Sleep apnea    "dx'd ~ 2008; never MW'U mask" (02/28/2016)   Past Surgical History:  Procedure Laterality Date   ACHILLES TENDON SURGERY Left ~ 2012   APPENDECTOMY  11/12/2015   lap appy   BUBBLE STUDY  12/04/2021   Procedure: BUBBLE STUDY;  Surgeon: Buford Dresser, MD;  Location: [place];  Service: Cardiovascular;;   CLUB FOOT RELEASE Bilateral [place] LYMPH NODE BIOPSY Right 01/20/2016   Procedure: EXCISIONAL BIOPSY OF RIGHT INGUINAL LYMPH NODE;  Surgeon: Greer Pickerel, MD;  Location: [place];  Service: General;  Laterality: Right;   IR IMAGING GUIDED PORT INSERTION  11/02/2019   LAPAROSCOPIC APPENDECTOMY N/A 11/12/2015   Procedure: APPENDECTOMY LAPAROSCOPIC;  Surgeon: Greer Pickerel, MD;  Location: [place];  Service: General;  Laterality: N/A;   LYMPH NODE BIOPSY Left 03/20/2020   Procedure: EXCISIONAL BIOPSY LEFT INGUINAL LYMPH NODE;  Surgeon: Coralie Keens, MD;  Location: [place];  Service:  General;  Laterality: Left;  LMA   SUTURE REMOVAL Left ~ 2016-2017 X 3   "had to use permanent sutures w/my achilles OR; my body rejects them at times & I have to have them surgically removed; under anesthesia"   TEE WITHOUT CARDIOVERSION N/A 12/04/2021   Procedure: TRANSESOPHAGEAL ECHOCARDIOGRAM (TEE);  Surgeon: Buford Dresser, MD;  [facility]COPY;  Service: Cardiovascular;  Laterality: N/A;   VASECTOMY Bilateral 07/2019   VENTRAL HERNIA REPAIR  1994    Allergies  Allergies  Allergen Reactions   Mango Flavor Swelling and Other (See Comments)    LIPS SWELL   Rituximab Other (See Comments)    Blood pressure went extremely low.    Shellfish Allergy Anaphylaxis   Lactose Intolerance ([place]) Diarrhea    History of Present Illness    James Jennings  is a 51 year old male with the above mention past medical history who presents today for VT alert seen on 30-day event monitor.  Mr. Breit was initially seen by Dr. Marlou Porch on 05/2021 for evaluation of PVCs and tachycardia by his PCP.  During visit patient had complaints of lower extremity edema on the left ankle improved with elevation.  He denied chest pain but endorsed dizziness when standing and denied tachycardia during the day.  2D echo was completed that revealed EF of 60 to 65%, no RWMA with normal RV function and no evidence of MV or AV  regurgitation.  He has documented sleep apnea and uses mouthguard but no CPAP.  He presented to the ED on 12/01/2021 for acute code stroke after suffering embolic CVA and was treated with IV TNK.  He was started on Plavix and ASA 81 mg.  2D echo was completed 12/02/2021 with EF 60-65%.  He underwent TEE on 11/2 that showed small PFO with aneurysmal septum.  He was referred to structural heart and was seen by Dr.Thukkani for evaluation.James Jennings  He was given a 30-day event monitor to rule out possible atrial fibrillation and plan for transcatheter closure if no AF is present.  Monitoring device company sent alert on  12/12 indicating episode of VT.  Quentin Ore DOD reviewed patient's symptoms and history and recommended patient see provider for possible initiation of beta-blocker therapy.   Since last being seen in the office patient reports***.  Patient denies chest pain, palpitations, dyspnea, PND, orthopnea, nausea, vomiting, dizziness, syncope, edema, weight gain, or early satiety.     ***Notes: Patient had episode of ventricular tachycardia by event monitor yesterday and case reviewed by DOD Dr. Quentin Ore who recommended visit and initiation of beta-blocker therapy. Home Medications    Current Outpatient Medications  Medication Sig Dispense Refill   acetaminophen (TYLENOL) 325 MG tablet Take 650 mg by mouth daily as needed for moderate pain or headache.     ALPRAZolam (XANAX) 0.25 MG tablet Take 0.25 mg by mouth 2 (two) times daily as needed.     aspirin EC 81 MG tablet Take 1 tablet (81 mg total) by mouth daily. Swallow whole. 30 tablet 12   atorvastatin (LIPITOR) 80 MG tablet Take 1 tablet (80 mg total) by mouth daily. 90 tablet 0   cetirizine (ZYRTEC) 10 MG tablet Take 10 mg by mouth daily.     clopidogrel (PLAVIX) 75 MG tablet Take 1 tablet (75 mg total) by mouth daily. 30 tablet 3   FARXIGA 10 MG TABS tablet Take 10 mg by mouth daily.     lidocaine-prilocaine (EMLA) cream Apply 1 application topically as needed. Apply to [facility] 1 hour prior to procedure. 30 g 0   metFORMIN (GLUCOPHAGE) 500 MG tablet Take 1 tablet (500 mg total) by mouth daily. 90 tablet 0   multivitamin (ONE-A-DAY MEN'S) TABS tablet Take 1 tablet by mouth daily.     polycarbophil (FIBERCON) 625 MG tablet Take 625 mg by mouth daily.     No current facility-administered medications for this visit.     Review of Systems  Please see the history of present illness.    (+)*** (+)***  All other systems reviewed and are otherwise negative except as noted above.  Physical Exam    Wt Readings from Last 3 Encounters:  12/19/21 268 lb  (121.6 kg)  12/11/21 274 lb 6.4 oz (124.5 kg)  12/04/21 272 lb (123.4 kg)   BJ:YNWGN were no vitals filed for this visit.,There is no height or weight on file to calculate BMI.  Constitutional:      Appearance: Healthy appearance. Not in distress.  Neck:     Vascular: JVD normal.  Pulmonary:     Effort: Pulmonary effort is normal.     Breath sounds: No wheezing. No rales. Diminished in the bases Cardiovascular:     Normal rate. Regular rhythm. Normal S1. Normal S2.      Murmurs: There is no murmur.  Edema:    Peripheral edema absent.  Abdominal:     Palpations: Abdomen is soft non tender. There is no  hepatomegaly.  Skin:    General: Skin is warm and dry.  Neurological:     General: No focal deficit present.     Mental Status: Alert and oriented to person, place and time.     Cranial Nerves: Cranial nerves are intact.  EKG/LABS/Other Studies Reviewed    ECG personally reviewed by me today - ***  Risk Assessment/Calculations:   {Does this patient have ATRIAL FIBRILLATION?[phone]}        Lab Results  Component Value Date   WBC 6.0 12/19/2021   HGB 13.9 12/19/2021   HCT 40.9 12/19/2021   MCV 92.5 12/19/2021   PLT 230 12/19/2021   Lab Results  Component Value Date   CREATININE 1.09 12/19/2021   BUN 13 12/19/2021   NA 140 12/19/2021   K 4.0 12/19/2021   CL 102 12/19/2021   CO2 28 12/19/2021   Lab Results  Component Value Date   ALT 21 12/19/2021   AST 20 12/19/2021   ALKPHOS 138 (H) 12/19/2021   BILITOT 0.8 12/19/2021   Lab Results  Component Value Date   CHOL 132 12/02/2021   HDL 37 (L) 12/02/2021   LDLCALC 77 12/02/2021   TRIG 92 12/02/2021   CHOLHDL 3.6 12/02/2021    Lab Results  Component Value Date   HGBA1C 6.4 (H) 12/02/2021    Assessment & Plan    1.  Ventricular tachycardia: -Patient had documented episode of VT on 12/12 found on 30-day [employer] event monitor to rule out atrial fibrillation following CVA.  Case reviewed by DOD Dr.  Quentin Ore who recommended follow-up evaluation. -Today patient reports***   2.History of embolic CVA: -s/p acute left PCA infarct treated with TNK with unclear etiology. -Continue GDMT with ASA 81 mg, atorvastatin 80 mg, and Plavix 75 mg  3.  PFO with -TEE performed and showed small PFO with aneurysmal septum -Patient referred to structural heart and [employer] 30-day monitor worn to rule out atrial fibrillation. -Plan for closure if AF not indicated  4.  Hyperlipidemia -Patient's last LDL was 77 -Continue Lipitor 80 mg daily   Disposition: Follow-up with None or APP in *** months {Are you ordering a CV Procedure (e.g. stress test, cath, DCCV, TEE, etc)?   Press F2        :[id number]}   Medication Adjustments/Labs and Tests Ordered: Current medicines are reviewed at length with the patient today.  Concerns regarding medicines are outlined above.   Signed, Mable Fill, Marissa Nestle, NP 01/12/2022, 6:59 PM [facility]  Note:  This document was prepared using Dragon voice recognition software and may include unintentional dictation errors.

## 2022-01-12 NOTE — Telephone Encounter (Signed)
   Cardiac Monitor Alert  Date of alert:  01/12/2022   Patient Name: James Jennings  DOB: 1970-08-02  MRN: [id number]   [place] Cardiologist: None  [facility] EP:  None    Monitor Information: Cardiac Event Monitor [[employer]]  Reason:  CVA Ordering provider:  Dr Mamie Nick. Sethi   Alert Ventricular Tachycardia This is the 1st alert for this rhythm.   Next Cardiology Appointment   Date:  02/04/2022  Provider:  Dr Ali Lowe  The patient was contacted today.  He is asymptomatic. Arrhythmia, symptoms and history reviewed with Dr Quentin Ore, DOD.   Plan:  Dr Quentin Ore recommends pt see provider this week to begin on beta blocker   Other: Spoke with pt and appointment scheduled with Ambrose Pancoast, NP 01/14/2022 at 8am.  Pt verbalizes understanding and agrees with current plan.  Thora Lance, RN  01/12/2022 11:09 AM

## 2022-01-14 ENCOUNTER — Ambulatory Visit: Payer: BC Managed Care – PPO | Attending: Nurse Practitioner | Admitting: Nurse Practitioner

## 2022-01-14 ENCOUNTER — Encounter: Payer: Self-pay | Admitting: Nurse Practitioner

## 2022-01-14 VITALS — BP 116/70 | HR 93 | Ht 71.75 in | Wt 274.0 lb

## 2022-01-14 DIAGNOSIS — I472 Ventricular tachycardia, unspecified: Secondary | ICD-10-CM

## 2022-01-14 DIAGNOSIS — I63432 Cerebral infarction due to embolism of left posterior cerebral artery: Secondary | ICD-10-CM

## 2022-01-14 DIAGNOSIS — E785 Hyperlipidemia, unspecified: Secondary | ICD-10-CM

## 2022-01-14 DIAGNOSIS — Q2112 Patent foramen ovale: Secondary | ICD-10-CM

## 2022-01-14 MED ORDER — METOPROLOL SUCCINATE ER 25 MG PO TB24: 25.0000 mg | ORAL_TABLET | Freq: Every day | ORAL | 2 refills | Status: DC

## 2022-01-14 NOTE — Patient Instructions (Addendum)
Medication Instructions:  START Metoprolol '25mg'$  Once a day and you can take an additional half tablet if your heart rate is over 100. *If you need a refill on your cardiac medications before your next appointment, please call your pharmacy*   Lab Work: None Ordered  Testing/Procedures: None Ordered   Follow-Up: At [facility], you and your health needs are our priority.  As part of our continuing mission to provide you with exceptional heart care, we have created designated Provider Care Teams.  These Care Teams include your primary Cardiologist (physician) and Advanced Practice Providers (APPs -  Physician Assistants and Nurse Practitioners) who all work together to provide you with the care you need, when you need it.  We recommend signing up for the patient portal called "MyChart".  Sign up information is provided on this After Visit Summary.  MyChart is used to connect with patients for Virtual Visits (Telemedicine).  Patients are able to view lab/test results, encounter notes, upcoming appointments, etc.  Non-urgent messages can be sent to your provider as well.   To learn more about what you can do with MyChart, go to [email or web address].    Your next appointment:   FOLLOW UP AS SCHEDULED  The format for your next appointment:   In Person  Provider:    Early Osmond, MD  Other Instructions Monitor your heart rate and contact the office if your heart rate is consistently over 100. Continue checking your blood pressure daily  Important Information About Sugar

## 2022-01-15 ENCOUNTER — Encounter: Payer: Self-pay | Admitting: Neurology

## 2022-01-16 ENCOUNTER — Other Ambulatory Visit: Payer: Self-pay

## 2022-01-22 ENCOUNTER — Other Ambulatory Visit: Payer: Self-pay

## 2022-01-29 ENCOUNTER — Encounter: Payer: Self-pay | Admitting: Neurology

## 2022-01-29 ENCOUNTER — Ambulatory Visit (INDEPENDENT_AMBULATORY_CARE_PROVIDER_SITE_OTHER): Payer: BC Managed Care – PPO | Admitting: Neurology

## 2022-01-29 VITALS — BP 125/73 | HR 92 | Ht 71.0 in | Wt 270.0 lb

## 2022-01-29 DIAGNOSIS — I63432 Cerebral infarction due to embolism of left posterior cerebral artery: Secondary | ICD-10-CM | POA: Diagnosis not present

## 2022-01-29 DIAGNOSIS — Q2112 Patent foramen ovale: Secondary | ICD-10-CM | POA: Diagnosis not present

## 2022-01-29 DIAGNOSIS — G4733 Obstructive sleep apnea (adult) (pediatric): Secondary | ICD-10-CM

## 2022-01-29 NOTE — Patient Instructions (Signed)
I had a long d/w patient about his recent cryptogenic stroke, PFO,risk for recurrent stroke/TIAs, personally independently reviewed imaging studies and stroke evaluation results and answered questions.Continue aspirin 81 mg daily and clopidogrel 75 mg daily  for secondary stroke prevention and maintain strict control of hypertension with blood pressure goal below 130/90, diabetes with hemoglobin A1c goal below 6.5% and lipids with LDL cholesterol goal below 70 mg/dL. I also advised the patient to eat a healthy diet with plenty of whole grains, cereals, fruits and vegetables, exercise regularly and maintain ideal body weight.  Follow-up in 30 days heart monitor results.  Keep scheduled appointment with cardiologist Dr.  Ali Lowe consider elective PFO closure.  He was also counseled to start using CPAP regularly for his sleep apnea followup in the future with me in 3 months or call earlier if necessary.  Stroke Prevention Some medical conditions and behaviors can lead to a higher chance of having a stroke. You can help prevent a stroke by eating healthy, exercising, not smoking, and managing any medical conditions you have. Stroke is a leading cause of functional impairment. Primary prevention is particularly important because a majority of strokes are first-time events. Stroke changes the lives of not only those who experience a stroke but also their family and other caregivers. How can this condition affect me? A stroke is a medical emergency and should be treated right away. A stroke can lead to brain damage and can sometimes be life-threatening. If a person gets medical treatment right away, there is a better chance of surviving and recovering from a stroke. What can increase my risk? The following medical conditions may increase your risk of a stroke: Cardiovascular disease. High blood pressure (hypertension). Diabetes. High cholesterol. Sickle cell disease. Blood clotting disorders (hypercoagulable  state). Obesity. Sleep disorders (obstructive sleep apnea). Other risk factors include: Being older than age 55. Having a history of blood clots, stroke, or mini-stroke (transient ischemic attack, TIA). Genetic factors, such as race, ethnicity, or a family history of stroke. Smoking cigarettes or using other tobacco products. Taking birth control pills, especially if you also use tobacco. Heavy use of alcohol or drugs, especially cocaine and methamphetamine. Physical inactivity. What actions can I take to prevent this? Manage your health conditions High cholesterol levels. Eating a healthy diet is important for preventing high cholesterol. If cholesterol cannot be managed through diet alone, you may need to take medicines. Take any prescribed medicines to control your cholesterol as told by your health care provider. Hypertension. To reduce your risk of stroke, try to keep your blood pressure below 130/80. Eating a healthy diet and exercising regularly are important for controlling blood pressure. If these steps are not enough to manage your blood pressure, you may need to take medicines. Take any prescribed medicines to control hypertension as told by your health care provider. Ask your health care provider if you should monitor your blood pressure at home. Have your blood pressure checked every year, even if your blood pressure is normal. Blood pressure increases with age and some medical conditions. Diabetes. Eating a healthy diet and exercising regularly are important parts of managing your blood sugar (glucose). If your blood sugar cannot be managed through diet and exercise, you may need to take medicines. Take any prescribed medicines to control your diabetes as told by your health care provider. Get evaluated for obstructive sleep apnea. Talk to your health care provider about getting a sleep evaluation if you snore a lot or have excessive sleepiness. Make  sure that any other  medical conditions you have, such as atrial fibrillation or atherosclerosis, are managed. Nutrition Follow instructions from your health care provider about what to eat or drink to help manage your health condition. These instructions may include: Reducing your daily calorie intake. Limiting how much salt (sodium) you use to 1,500 milligrams (mg) each day. Using only healthy fats for cooking, such as olive oil, canola oil, or sunflower oil. Eating healthy foods. You can do this by: Choosing foods that are high in fiber, such as whole grains, and fresh fruits and vegetables. Eating at least 5 servings of fruits and vegetables a day. Try to fill one-half of your plate with fruits and vegetables at each meal. Choosing lean protein foods, such as lean cuts of meat, poultry without skin, fish, tofu, beans, and nuts. Eating low-fat dairy products. Avoiding foods that are high in sodium. This can help lower blood pressure. Avoiding foods that have saturated fat, trans fat, and cholesterol. This can help prevent high cholesterol. Avoiding processed and prepared foods. Counting your daily carbohydrate intake.  Lifestyle If you drink alcohol: Limit how much you have to: 0-1 drink a day for women who are not pregnant. 0-2 drinks a day for men. Know how much alcohol is in your drink. In the [place] one drink equals one 12 oz bottle of beer (3101m), one 5 oz glass of wine (1433m, or one 1 oz glass of hard liquor (4440m Do not use any products that contain nicotine or tobacco. These products include cigarettes, chewing tobacco, and vaping devices, such as e-cigarettes. If you need help quitting, ask your health care provider. Avoid secondhand smoke. Do not use drugs. Activity  Try to stay at a healthy weight. Get at least 30 minutes of exercise on most days, such as: Fast walking. Biking. Swimming. Medicines Take over-the-counter and prescription medicines only as told by your health care  provider. Aspirin or blood thinners (antiplatelets or anticoagulants) may be recommended to reduce your risk of forming blood clots that can lead to stroke. Avoid taking birth control pills. Talk to your health care provider about the risks of taking birth control pills if: You are over 35 21ars old. You smoke. You get very bad headaches. You have had a blood clot. Where to find more information American Stroke Association: www.strokeassociation.org Get help right away if: You or a loved one has any symptoms of a stroke. "BE FAST" is an easy way to remember the main warning signs of a stroke: B - Balance. Signs are dizziness, sudden trouble walking, or loss of balance. E - Eyes. Signs are trouble seeing or a sudden change in vision. F - Face. Signs are sudden weakness or numbness of the face, or the face or eyelid drooping on one side. A - Arms. Signs are weakness or numbness in an arm. This happens suddenly and usually on one side of the body. S - Speech. Signs are sudden trouble speaking, slurred speech, or trouble understanding what people say. T - Time. Time to call emergency services. Write down what time symptoms started. You or a loved one has other signs of a stroke, such as: A sudden, severe headache with no known cause. Nausea or vomiting. Seizure. These symptoms may represent a serious problem that is an emergency. Do not wait to see if the symptoms will go away. Get medical help right away. Call your local emergency services (911 in the [place] Do not drive yourself to the hospital. Summary You can  help to prevent a stroke by eating healthy, exercising, not smoking, limiting alcohol intake, and managing any medical conditions you may have. Do not use any products that contain nicotine or tobacco. These include cigarettes, chewing tobacco, and vaping devices, such as e-cigarettes. If you need help quitting, ask your health care provider. Remember "BE FAST" for warning signs of a  stroke. Get help right away if you or a loved one has any of these signs. This information is not intended to replace advice given to you by your health care provider. Make sure you discuss any questions you have with your health care provider. Document Revised: 08/21/2019 Document Reviewed: 08/21/2019 Elsevier Patient Education  [place].

## 2022-01-29 NOTE — Progress Notes (Signed)
[facility] [address] [place]. [place] [address] [phone]       OFFICE CONSULT NOTE  James Jennings Date of Birth:  1970-03-26 Medical Record Number:  [id number]   Referring MD: Rosalin Hawking  Reason for Referral: Stroke  HPI: James Jennings is a 51 year old Caucasian male seen today for initial office consultation visit for stroke.  History is obtained from the patient and review of electronic medical records.  I personally reviewed pertinent available imaging films in PACS.  He has past medical history of diabetes, hyperlipidemia sleep apnea, obesity and non-Hodgkin's lymphoma.  He developed sudden onset of confusion and vision difficulties when he was about to sit in the car to drive back from his son's school.  He fortunately called his wife who was nearby who came and drove him to [facility] emergency room.  EMS called a code stroke.  He was evaluated by Dr. Rigoberto Noel and felt to have right homonymous hemianopsia and was given IV TNK.  His confusion resolved quickly and peripheral vision took several days to improve.  CT scan of the head showed no acute abnormality and CT angiogram of the neck and brain showed no large vessel stenosis or occlusion.  MRI scan of the brain showed an acute infarct involving left occipital cortex and hippocampal scale in the PCA distribution without any hemorrhage.  Echocardiogram shows ejection fraction of 60 to 65% and TEE showed patent foramen ovale with atrial septal aneurysm.  His rope score was5-6 and he has been referred for outpatient PFO closure evaluation to Dr. Ali Lowe.  He is currently wearing a 30-day heart monitor on which she had a brief episode of ventricular tachycardia and has been started on low-dose Toprol-XL 25 mg daily.  LDL cholesterol was 77 mg percent and hemoglobin A1c of 6.4.  He was started on aspirin and Plavix which she is tolerating well without bruising or bleeding.  He states his peripheral vision also seems to have  improved.  He is returned back to his baseline and has no respiratory deficits.  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  He has had some mild short-term memory difficulties for several years and feels that may have gotten slightly worse.  He is tolerating Lipitor well without muscle aches and pains..  He has no new complaints today.  He does have obstructive sleep apnea and has  been using his CPAP regularly.  ROS:   14 system review of systems is positive for confusion, vision difficulties and all other systems negative  PMH:  Past Medical History:  Diagnosis Date   Anxiety    Bilateral swelling of feet    Diabetes mellitus without complication ([name])    High cholesterol    Joint pain    Lactose intolerance    Large cell, follicular non-Hodgkin's lymphoma ([name])    Lymphoma, follicular ([place]) dx'd 17/9150   Multiple food allergies    Other fatigue    Palpitations    PONV (postoperative nausea and vomiting)    Pre-diabetes    Prediabetes    Shortness of breath on exertion    Sleep apnea    "dx'd ~ 2008; never VW'P mask" (02/28/2016)    Social History:  Social History   Socioeconomic History   Marital status: Married    Spouse name: James Jennings   Number of children: 1   Years of education: Not on file   Highest education level: Not on  file  Occupational History   Occupation: auditor  Tobacco Use   Smoking status: Former    Packs/day: 1.00    Years: 30.00    Total pack years: 30.00    Types: Cigarettes    Quit date: 05/05/2017    Years since quitting: 4.7   Smokeless tobacco: Former    Types: Snuff    Quit date: 1997   Tobacco comments:    11/12/2015 "quit using chew in ~ 1997"  Vaping Use   Vaping Use: Never used  Substance and Sexual Activity   Alcohol use: Not Currently   Drug use: Not Currently    Types: Marijuana    Comment: "in the early 1990s; recreational"   Sexual activity: Yes    Partners: Female  Other Topics  Concern   Not on file  Social History Narrative   Not on file   Social Determinants of Health   Financial Resource Strain: Not on file  Food Insecurity: No Food Insecurity (12/03/2021)   Hunger Vital Sign    Worried About Running Out of Food in the Last Year: Never true    Ran Out of Food in the Last Year: Never true  Transportation Needs: No Transportation Needs (12/03/2021)   PRAPARE - Hydrologist (Medical): No    Lack of Transportation (Non-Medical): No  Physical Activity: Not on file  Stress: Not on file  Social Connections: Not on file  Intimate Partner Violence: Not At Risk (12/03/2021)   Humiliation, Afraid, Rape, and Kick questionnaire    Fear of Current or Ex-Partner: No    Emotionally Abused: No    Physically Abused: No    Sexually Abused: No    Medications:   Current Outpatient Medications on File Prior to Visit  Medication Sig Dispense Refill   acetaminophen (TYLENOL) 325 MG tablet Take 650 mg by mouth daily as needed for moderate pain or headache.     ALPRAZolam (XANAX) 0.25 MG tablet Take 0.25 mg by mouth 2 (two) times daily as needed.     aspirin EC 81 MG tablet Take 1 tablet (81 mg total) by mouth daily. Swallow whole. 30 tablet 12   atorvastatin (LIPITOR) 80 MG tablet Take 1 tablet (80 mg total) by mouth daily. 90 tablet 0   cetirizine (ZYRTEC) 10 MG tablet Take 10 mg by mouth daily.     clopidogrel (PLAVIX) 75 MG tablet Take 1 tablet (75 mg total) by mouth daily. 30 tablet 3   FARXIGA 10 MG TABS tablet Take 10 mg by mouth daily.     lidocaine-prilocaine (EMLA) cream Apply 1 application topically as needed. Apply to [facility] 1 hour prior to procedure. 30 g 0   metFORMIN (GLUCOPHAGE) 500 MG tablet Take 1 tablet (500 mg total) by mouth daily. 90 tablet 0   metoprolol succinate (TOPROL XL) 25 MG 24 hr tablet Take 1 tablet (25 mg total) by mouth daily. Can take additional half tablet if heart rate is over 100 45 tablet 2   multivitamin  (ONE-A-DAY MEN'S) TABS tablet Take 1 tablet by mouth daily.     polycarbophil (FIBERCON) 625 MG tablet Take 625 mg by mouth daily.     No current facility-administered medications on file prior to visit.    Allergies:   Allergies  Allergen Reactions   Mango Flavor Swelling and Other (See Comments)    LIPS SWELL   Rituximab Other (See Comments)    Blood pressure went extremely low.  Shellfish Allergy Anaphylaxis   Lactose Intolerance (Gi) Diarrhea    Physical Exam General: Mildly obese middle-aged Caucasian male, seated, in no evident distress Head: head normocephalic and atraumatic.   Neck: supple with no carotid or supraclavicular bruits Cardiovascular: regular rate and rhythm, no murmurs Musculoskeletal: no deformity Skin:  no rash/petichiae Vascular:  Normal pulses all extremities  Neurologic Exam Mental Status: Awake and fully alert. Oriented to place and time. Recent and remote memory intact. Attention span, concentration and fund of knowledge appropriate. Mood and affect appropriate.  Recall 3/3.  Clock drawing 4/4.  Able to name 15 animals which can walk on 4 legs. Cranial Nerves: Fundoscopic exam reveals sharp disc margins. Pupils equal, briskly reactive to light. Extraocular movements full without nystagmus. Visual fields full to confrontation. Hearing intact. Facial sensation intact. Face, tongue, palate moves normally and symmetrically.  Motor: Normal bulk and tone. Normal strength in all tested extremity muscles. Sensory.: intact to touch , pinprick , position and vibratory sensation.  Coordination: Rapid alternating movements normal in all extremities. Finger-to-nose and heel-to-shin performed accurately bilaterally. Gait and Station: Arises from chair without difficulty. Stance is normal. Gait demonstrates normal stride length and balance . Able to heel, toe and tandem walk without difficulty.  Reflexes: 1+ and symmetric. Toes downgoing.   NIHSS  0 Modified Rankin   0   ASSESSMENT: 51 year old Caucasian male with left PCA branch infarct October 2023 of cryptogenic etiology treated with IV TNK with excellent clinical recovery no residual deficits.  Vascular risk factors of obesity, sleep apnea, diabetes, hypertension, hyperlipidemia and patent foramen ovale.     PLAN:I had a long d/w patient about his recent cryptogenic stroke, PFO,risk for recurrent stroke/TIAs, personally independently reviewed imaging studies and stroke evaluation results and answered questions.Continue aspirin 81 mg daily and clopidogrel 75 mg daily  for secondary stroke prevention and maintain strict control of hypertension with blood pressure goal below 130/90, diabetes with hemoglobin A1c goal below 6.5% and lipids with LDL cholesterol goal below 70 mg/dL. I also advised the patient to eat a healthy diet with plenty of whole grains, cereals, fruits and vegetables, exercise regularly and maintain ideal body weight.  Follow-up in 30 days heart monitor results.  Keep scheduled appointment with cardiologist Dr.  Ali Lowe consider elective PFO closure.  He has a ROPE score of 6 which is borderline but given high risk features on TEE he may benefit with endovascular PFO closure he was also counseled to start using CPAP regularly for his sleep apnea followup in the future with me in 3 months or call earlier if necessary. Greater than 50% time during this 45-minute consultation visit was spent on counseling and coordination of care about his cryptogenic stroke and PFO and discussion about stroke prevention and answering questions Antony Contras, MD Note: This document was prepared with digital dictation and possible smart phrase technology. Any transcriptional errors that result from this process are unintentional.

## 2022-01-30 ENCOUNTER — Other Ambulatory Visit: Payer: Self-pay

## 2022-02-03 NOTE — H&P (View-Only) (Signed)
Cardiology Office Note:    Date:  02/04/2022   ID:  James Jennings, DOB 02/20/1970, MRN [id number]  PCP:  James Lopes, MD   [place] Providers Cardiologist:  [name], MD Referring MD: James Lopes, MD   Chief Complaint/Reason for Referral:  PFO in context of stroke  ASSESSMENT:    1. Acute ischemic stroke ([place])   2. PFO (patent foramen ovale)   3. Type 2 diabetes mellitus without complication, without long-term current use of insulin ([name])   4. Hypertension associated with diabetes ([name])   5. Hyperlipidemia associated with type 2 diabetes mellitus ([name])   6. Aortic atherosclerosis ([name])      PLAN:    In order of problems listed above: 1.  Stroke: His monitor demonstrated no atrial fibrillation but did show nonsustained ventricular tachycardia for which she is on a beta-blocker.  Will refer him for PFO and ASD closure. 2.  PFO: The patient has high risk anatomy with an intra-atrial septal aneurysm and PFO with a small associated ASD consistent with a fenestrated septum.  We will refer him for transcatheter closure.  I did discuss with the patient that he may require multiple devices for complete closure.  He will need dual antiplatelet therapy for at least 6 months following the procedure followed by aspirin monotherapy thereafter.  Additionally he will need antibiotic prophylaxis for all dental work including cleanings within that 60-month 3.  Type 2 diabetes: Continue aspirin, statin, and Farxiga.  Consider ACE or ARB in the future. 4.  Hypertension: Blood pressure is well-controlled on his current regimen. 5.  Hyperlipidemia: Continue statin.  Goal LDL is now less than 55 given history of diabetes and stroke.  His LDL recently was 77; will DC atorvastatin 80 mg and start Crestor 40 mg.  Check lipid panel is in 2 months. 6.  Aortic atherosclerosis: Continue aspirin, statin, and strict blood pressure control.             Dispo:  No follow-ups on file.       Medication Adjustments/Labs and Tests Ordered: Current medicines are reviewed at length with the patient today.  Concerns regarding medicines are outlined above.  The following changes have been made:  no change   Labs/tests ordered: Orders Placed This Encounter  Procedures   CBC   Basic metabolic panel   Lipid panel   Hepatic function panel   EKG 12-Lead    Medication Changes: Meds ordered this encounter  Medications   rosuvastatin (CRESTOR) 40 MG tablet    Sig: Take 1 tablet (40 mg total) by mouth daily.    Dispense:  90 tablet    Refill:  3    Stop lipitor     Current medicines are reviewed at length with the patient today.  The patient does not have concerns regarding medicines.   History of Present Illness:    FOCUSED PROBLEM LIST:   1.  Acute infarction of left occipital lobe treated with TNK October 2023 2.  B-cell lymphoma in remission; on maintenance immunotherapy with Gazyva 3.  Type 2 diabetes 4.  Hyperlipidemia 5.  BMI of 37 6.  Aortic atherosclerosis on PET scan 2023  November 2023 consultation: The patient is a 52y.o. male with the indicated medical history here for recommendations regarding PFO in the context of an acute stroke.  Patient was in his normal state of health up until October 30 of this year.  At that point in time he developed visual field  changes and confusion.  Head CT was negative for bleed.  CTA showed no large vessel occlusions.  An MRI did demonstrate an embolic infarction of his left occipital lobe and hippocampus.  He underwent TNKase therapy.  Is work-up including hypercoagulable panel was negative.  Lower extremity Dopplers were negative.  The patient is here with his wife.  He is basically back to baseline.  He has some mild difficulties with cognitive function and word finding but overall he is almost back to baseline.  He has had no presyncope or syncope.  He has had palpitations on occasion over the last several years.  He cannot  really delineate how often this happens and they seem to be stress related at times.  He denies any exertional angina.  He has had no severe bleeding or bruising while on dual antiplatelet therapy.  Plan: 30-day event monitor to rule out occult atrial fibrillation.  Today: The patient returns for follow-up.  He had a 30-day monitor which showed no occult atrial fibrillation.  He was seen by [name] recently was doing very well.  The patient was continued on DAPT.  The patient is doing well.  He has not developed any signs or symptoms of recurrent stroke.  He was started on a beta-blocker due to a few episodes of nonsustained ventricular tachycardia for which she was completely asymptomatic.  He is tolerating this beta-blocker fairly well.  He has had no issues with severe bleeding or bruising while on dual antiplatelet therapy.  He is back to work and otherwise doing very well.         Current Medications: Current Meds  Medication Sig   acetaminophen (TYLENOL) 325 MG tablet Take 650 mg by mouth daily as needed for moderate pain or headache.   ALPRAZolam (XANAX) 0.25 MG tablet Take 0.25 mg by mouth 2 (two) times daily as needed.   aspirin EC 81 MG tablet Take 1 tablet (81 mg total) by mouth daily. Swallow whole.   cetirizine (ZYRTEC) 10 MG tablet Take 10 mg by mouth daily.   clopidogrel (PLAVIX) 75 MG tablet Take 1 tablet (75 mg total) by mouth daily.   FARXIGA 10 MG TABS tablet Take 10 mg by mouth daily.   lidocaine-prilocaine (EMLA) cream Apply 1 application topically as needed. Apply to [facility] 1 hour prior to procedure.   metFORMIN (GLUCOPHAGE) 500 MG tablet Take 1 tablet (500 mg total) by mouth daily.   metoprolol succinate (TOPROL XL) 25 MG 24 hr tablet Take 1 tablet (25 mg total) by mouth daily. Can take additional half tablet if heart rate is over 100   multivitamin (ONE-A-DAY MEN'S) TABS tablet Take 1 tablet by mouth daily.   polycarbophil (FIBERCON) 625 MG tablet Take 625 mg by mouth  daily.   rosuvastatin (CRESTOR) 40 MG tablet Take 1 tablet (40 mg total) by mouth daily.   [DISCONTINUED] atorvastatin (LIPITOR) 80 MG tablet Take 1 tablet (80 mg total) by mouth daily.     Allergies:    Mango flavor, Rituximab, Shellfish allergy, and Lactose intolerance (gi)   Social History:   Social History   Tobacco Use   Smoking status: Former    Packs/day: 1.00    Years: 30.00    Total pack years: 30.00    Types: Cigarettes    Quit date: 05/05/2017    Years since quitting: 4.7   Smokeless tobacco: Former    Types: Snuff    Quit date: 1997   Tobacco comments:  11/12/2015 "quit using chew in ~ 1997"  Vaping Use   Vaping Use: Never used  Substance Use Topics   Alcohol use: Not Currently   Drug use: Not Currently    Types: Marijuana    Comment: "in the early 1990s; recreational"     Family Hx: Family History  Problem Relation Age of Onset   Colon cancer Neg Hx    Colon polyps Neg Hx    Esophageal cancer Neg Hx    Stomach cancer Neg Hx    Rectal cancer Neg Hx      Review of Systems:   Please see the history of present illness.    All other systems reviewed and are negative.     EKGs/Labs/Other Test Reviewed:    EKG:  EKG performed November 2023 that I personally reviewed demonstrates sinus rhythm; today that I personally reviewed demonstrates normal sinus rhythm with nonspecific T wave changes.  I reviewed all of the patient's EKGs back in 2018 and none demonstrated atrial fibrillation.  Prior CV studies:  30-day monitor November 2023: 1.  Total monitoring time was 30 days. 2.  The average heart rate was 88 bpm ranging from 51 to 159 bpm 3.  No significant pauses or atrial fibrillation was detected. 4.  3 episodes of nonsustained ventricular tachycardia were detected.  TEE October 2023: The report for this study is pending however I reviewed this TEE which demonstrates an intra-atrial septal aneurysm and PFO with an associated small ASD.  Ejection  fraction is normal with no significant valvular abnormalities.   TTE October 2023: 1. Left ventricular ejection fraction, by estimation, is 60 to 65%. The  left ventricle has normal function. The left ventricle has no regional  wall motion abnormalities. Left ventricular diastolic parameters were  normal.   2. Right ventricular systolic function is normal. The right ventricular  size is normal. Tricuspid regurgitation signal is inadequate for assessing  PA pressure.   3. The mitral valve is grossly normal. No evidence of mitral valve  regurgitation. No evidence of mitral stenosis.   4. The aortic valve was not well visualized. Aortic valve regurgitation  is not visualized. No aortic stenosis is present.   5. The inferior vena cava is normal in size with greater than 50%  respiratory variability, suggesting right atrial pressure of 3 mmHg.    Other studies Reviewed: Review of the additional studies/records demonstrates:   Head MRI 2023: Acute infarct involving left occipital lobe cortex and hippocampal tail in the PCA distribution without hemorrhage or mass effect.  CT angio head and neck 2023: Patent vasculature of the head and neck with no hemodynamically significant stenosis or occlusion.  Head CT 2023: 1. No acute intracranial pathology.   PET scan 2023 with aortic atherosclerosis  Recent Labs: 12/19/2021: ALT 21; BUN 13; Creatinine 1.09; Hemoglobin 13.9; Platelet Count 230; Potassium 4.0; Sodium 140   Recent Lipid Panel Lab Results  Component Value Date/Time   CHOL 132 12/02/2021 04:23 AM   CHOL 171 12/18/2020 08:17 AM   TRIG 92 12/02/2021 04:23 AM   HDL 37 (L) 12/02/2021 04:23 AM   HDL 42 12/18/2020 08:17 AM   LDLCALC 77 12/02/2021 04:23 AM   LDLCALC 110 (H) 12/18/2020 08:17 AM    Risk Assessment/Calculations:                Physical Exam:    VS:  BP (!) 126/58   Pulse 67   Ht 5' 11.5" (1.816 m)   Wt 271  lb 12.8 oz (123.3 kg)   SpO2 97%   BMI 37.38  kg/m    Wt Readings from Last 3 Encounters:  02/04/22 271 lb 12.8 oz (123.3 kg)  01/29/22 270 lb (122.5 kg)  01/14/22 274 lb (124.3 kg)    GENERAL:  No apparent distress, AOx3 HEENT:  No carotid bruits, +2 carotid impulses, no scleral icterus CAR: RRR no murmurs, gallops, rubs, or thrills RES:  Clear to auscultation bilaterally ABD:  Soft, nontender, nondistended, positive bowel sounds x 4 VASC:  +2 radial pulses, +2 carotid pulses, palpable pedal pulses NEURO:  CN 2-12 grossly intact; motor and sensory grossly intact PSYCH:  No active depression or anxiety EXT:  No edema, ecchymosis, or cyanosis  Signed, Early Osmond, MD  02/04/2022 3:00 PM    [address], [place], [place]  [address] Phone: [phone]; Fax: [phone]   Note:  This document was prepared using Dragon voice recognition software and may include unintentional dictation errors.

## 2022-02-03 NOTE — Progress Notes (Unsigned)
Cardiology Office Note:    Date:  02/04/2022   ID:  James Jennings, DOB 1970/11/26, MRN [id number]  PCP:  [name], MD   [place] Providers Cardiologist:  [name], MD Referring MD: [name], MD   Chief Complaint/Reason for Referral:  PFO in context of stroke  ASSESSMENT:    1. Acute ischemic stroke ([place])   2. PFO (patent foramen ovale)   3. Type 2 diabetes mellitus without complication, without long-term current use of insulin ([place])   4. Hypertension associated with diabetes ([place])   5. Hyperlipidemia associated with type 2 diabetes mellitus ([place])   6. Aortic atherosclerosis ([name])      PLAN:    In order of problems listed above: 1.  Stroke: His monitor demonstrated no atrial fibrillation but did show nonsustained ventricular tachycardia for which she is on a beta-blocker.  Will refer him for PFO and ASD closure. 2.  PFO: The patient has high risk anatomy with an intra-atrial septal aneurysm and PFO with a small associated ASD consistent with a fenestrated septum.  We will refer him for transcatheter closure.  I did discuss with the patient that he may require multiple devices for complete closure.  He will need dual antiplatelet therapy for at least 6 months following the procedure followed by aspirin monotherapy thereafter.  Additionally he will need antibiotic prophylaxis for all dental work including cleanings within that 10-month 3.  Type 2 diabetes: Continue aspirin, statin, and Farxiga.  Consider ACE or ARB in the future. 4.  Hypertension: Blood pressure is well-controlled on his current regimen. 5.  Hyperlipidemia: Continue statin.  Goal LDL is now less than 55 given history of diabetes and stroke.  His LDL recently was 77; will DC atorvastatin 80 mg and start Crestor 40 mg.  Check lipid panel is in 2 months. 6.  Aortic atherosclerosis: Continue aspirin, statin, and strict blood pressure control.             Dispo:  No follow-ups on file.       Medication Adjustments/Labs and Tests Ordered: Current medicines are reviewed at length with the patient today.  Concerns regarding medicines are outlined above.  The following changes have been made:  no change   Labs/tests ordered: Orders Placed This Encounter  Procedures   CBC   Basic metabolic panel   Lipid panel   Hepatic function panel   EKG 12-Lead    Medication Changes: Meds ordered this encounter  Medications   rosuvastatin (CRESTOR) 40 MG tablet    Sig: Take 1 tablet (40 mg total) by mouth daily.    Dispense:  90 tablet    Refill:  3    Stop lipitor     Current medicines are reviewed at length with the patient today.  The patient does not have concerns regarding medicines.   History of Present Illness:    FOCUSED PROBLEM LIST:   1.  Acute infarction of left occipital lobe treated with TNK October 2023 2.  B-cell lymphoma in remission; on maintenance immunotherapy with Gazyva 3.  Type 2 diabetes 4.  Hyperlipidemia 5.  BMI of 37 6.  Aortic atherosclerosis on PET scan 2023  November 2023 consultation: The patient is a 52y.o. male with the indicated medical history here for recommendations regarding PFO in the context of an acute stroke.  Patient was in his normal state of health up until October 30 of this year.  At that point in time he developed visual field  changes and confusion.  Head CT was negative for bleed.  CTA showed no large vessel occlusions.  An MRI did demonstrate an embolic infarction of his left occipital lobe and hippocampus.  He underwent TNKase therapy.  Is work-up including hypercoagulable panel was negative.  Lower extremity Dopplers were negative.  The patient is here with his wife.  He is basically back to baseline.  He has some mild difficulties with cognitive function and word finding but overall he is almost back to baseline.  He has had no presyncope or syncope.  He has had palpitations on occasion over the last several years.  He cannot  really delineate how often this happens and they seem to be stress related at times.  He denies any exertional angina.  He has had no severe bleeding or bruising while on dual antiplatelet therapy.  Plan: 30-day event monitor to rule out occult atrial fibrillation.  Today: The patient returns for follow-up.  He had a 30-day monitor which showed no occult atrial fibrillation.  He was seen by [name] recently was doing very well.  The patient was continued on DAPT.  The patient is doing well.  He has not developed any signs or symptoms of recurrent stroke.  He was started on a beta-blocker due to a few episodes of nonsustained ventricular tachycardia for which she was completely asymptomatic.  He is tolerating this beta-blocker fairly well.  He has had no issues with severe bleeding or bruising while on dual antiplatelet therapy.  He is back to work and otherwise doing very well.         Current Medications: Current Meds  Medication Sig   acetaminophen (TYLENOL) 325 MG tablet Take 650 mg by mouth daily as needed for moderate pain or headache.   ALPRAZolam (XANAX) 0.25 MG tablet Take 0.25 mg by mouth 2 (two) times daily as needed.   aspirin EC 81 MG tablet Take 1 tablet (81 mg total) by mouth daily. Swallow whole.   cetirizine (ZYRTEC) 10 MG tablet Take 10 mg by mouth daily.   clopidogrel (PLAVIX) 75 MG tablet Take 1 tablet (75 mg total) by mouth daily.   FARXIGA 10 MG TABS tablet Take 10 mg by mouth daily.   lidocaine-prilocaine (EMLA) cream Apply 1 application topically as needed. Apply to [facility] 1 hour prior to procedure.   metFORMIN (GLUCOPHAGE) 500 MG tablet Take 1 tablet (500 mg total) by mouth daily.   metoprolol succinate (TOPROL XL) 25 MG 24 hr tablet Take 1 tablet (25 mg total) by mouth daily. Can take additional half tablet if heart rate is over 100   multivitamin (ONE-A-DAY MEN'S) TABS tablet Take 1 tablet by mouth daily.   polycarbophil (FIBERCON) 625 MG tablet Take 625 mg by mouth  daily.   rosuvastatin (CRESTOR) 40 MG tablet Take 1 tablet (40 mg total) by mouth daily.   [DISCONTINUED] atorvastatin (LIPITOR) 80 MG tablet Take 1 tablet (80 mg total) by mouth daily.     Allergies:    Mango flavor, Rituximab, Shellfish allergy, and Lactose intolerance (gi)   Social History:   Social History   Tobacco Use   Smoking status: Former    Packs/day: 1.00    Years: 30.00    Total pack years: 30.00    Types: Cigarettes    Quit date: 05/05/2017    Years since quitting: 4.7   Smokeless tobacco: Former    Types: Snuff    Quit date: 1997   Tobacco comments:  11/12/2015 "quit using chew in ~ 1997"  Vaping Use   Vaping Use: Never used  Substance Use Topics   Alcohol use: Not Currently   Drug use: Not Currently    Types: Marijuana    Comment: "in the early 1990s; recreational"     Family Hx: Family History  Problem Relation Age of Onset   Colon cancer Neg Hx    Colon polyps Neg Hx    Esophageal cancer Neg Hx    Stomach cancer Neg Hx    Rectal cancer Neg Hx      Review of Systems:   Please see the history of present illness.    All other systems reviewed and are negative.     EKGs/Labs/Other Test Reviewed:    EKG:  EKG performed November 2023 that I personally reviewed demonstrates sinus rhythm; today that I personally reviewed demonstrates normal sinus rhythm with nonspecific T wave changes.  I reviewed all of the patient's EKGs back in 2018 and none demonstrated atrial fibrillation.  Prior CV studies:  30-day monitor November 2023: 1.  Total monitoring time was 30 days. 2.  The average heart rate was 88 bpm ranging from 51 to 159 bpm 3.  No significant pauses or atrial fibrillation was detected. 4.  3 episodes of nonsustained ventricular tachycardia were detected.  TEE October 2023: The report for this study is pending however I reviewed this TEE which demonstrates an intra-atrial septal aneurysm and PFO with an associated small ASD.  Ejection  fraction is normal with no significant valvular abnormalities.   TTE October 2023: 1. Left ventricular ejection fraction, by estimation, is 60 to 65%. The  left ventricle has normal function. The left ventricle has no regional  wall motion abnormalities. Left ventricular diastolic parameters were  normal.   2. Right ventricular systolic function is normal. The right ventricular  size is normal. Tricuspid regurgitation signal is inadequate for assessing  PA pressure.   3. The mitral valve is grossly normal. No evidence of mitral valve  regurgitation. No evidence of mitral stenosis.   4. The aortic valve was not well visualized. Aortic valve regurgitation  is not visualized. No aortic stenosis is present.   5. The inferior vena cava is normal in size with greater than 50%  respiratory variability, suggesting right atrial pressure of 3 mmHg.    Other studies Reviewed: Review of the additional studies/records demonstrates:   Head MRI 2023: Acute infarct involving left occipital lobe cortex and hippocampal tail in the PCA distribution without hemorrhage or mass effect.  CT angio head and neck 2023: Patent vasculature of the head and neck with no hemodynamically significant stenosis or occlusion.  Head CT 2023: 1. No acute intracranial pathology.   PET scan 2023 with aortic atherosclerosis  Recent Labs: 12/19/2021: ALT 21; BUN 13; Creatinine 1.09; Hemoglobin 13.9; Platelet Count 230; Potassium 4.0; Sodium 140   Recent Lipid Panel Lab Results  Component Value Date/Time   CHOL 132 12/02/2021 04:23 AM   CHOL 171 12/18/2020 08:17 AM   TRIG 92 12/02/2021 04:23 AM   HDL 37 (L) 12/02/2021 04:23 AM   HDL 42 12/18/2020 08:17 AM   LDLCALC 77 12/02/2021 04:23 AM   LDLCALC 110 (H) 12/18/2020 08:17 AM    Risk Assessment/Calculations:                Physical Exam:    VS:  BP (!) 126/58   Pulse 67   Ht 5' 11.5" (1.816 m)   Wt 271  lb 12.8 oz (123.3 kg)   SpO2 97%   BMI 37.38  kg/m    Wt Readings from Last 3 Encounters:  02/04/22 271 lb 12.8 oz (123.3 kg)  01/29/22 270 lb (122.5 kg)  01/14/22 274 lb (124.3 kg)    GENERAL:  No apparent distress, AOx3 HEENT:  No carotid bruits, +2 carotid impulses, no scleral icterus CAR: RRR no murmurs, gallops, rubs, or thrills RES:  Clear to auscultation bilaterally ABD:  Soft, nontender, nondistended, positive bowel sounds x 4 VASC:  +2 radial pulses, +2 carotid pulses, palpable pedal pulses NEURO:  CN 2-12 grossly intact; motor and sensory grossly intact PSYCH:  No active depression or anxiety EXT:  No edema, ecchymosis, or cyanosis  Signed, Early Osmond, MD  02/04/2022 3:00 PM    [facility], [place], [place]  [address] Phone: [phone]; Fax: [phone]   Note:  This document was prepared using Dragon voice recognition software and may include unintentional dictation errors.

## 2022-02-04 ENCOUNTER — Ambulatory Visit: Payer: BC Managed Care – PPO | Attending: Internal Medicine | Admitting: Internal Medicine

## 2022-02-04 ENCOUNTER — Encounter: Payer: Self-pay | Admitting: Internal Medicine

## 2022-02-04 VITALS — BP 126/58 | HR 67 | Ht 71.5 in | Wt 271.8 lb

## 2022-02-04 DIAGNOSIS — E1159 Type 2 diabetes mellitus with other circulatory complications: Secondary | ICD-10-CM | POA: Diagnosis not present

## 2022-02-04 DIAGNOSIS — I152 Hypertension secondary to endocrine disorders: Secondary | ICD-10-CM

## 2022-02-04 DIAGNOSIS — I700 Atherosclerosis of aorta: Secondary | ICD-10-CM

## 2022-02-04 DIAGNOSIS — E119 Type 2 diabetes mellitus without complications: Secondary | ICD-10-CM | POA: Diagnosis not present

## 2022-02-04 DIAGNOSIS — E785 Hyperlipidemia, unspecified: Secondary | ICD-10-CM

## 2022-02-04 DIAGNOSIS — Q2112 Patent foramen ovale: Secondary | ICD-10-CM

## 2022-02-04 DIAGNOSIS — I639 Cerebral infarction, unspecified: Secondary | ICD-10-CM

## 2022-02-04 DIAGNOSIS — E1169 Type 2 diabetes mellitus with other specified complication: Secondary | ICD-10-CM

## 2022-02-04 MED ORDER — ROSUVASTATIN CALCIUM 40 MG PO TABS: 40.0000 mg | ORAL_TABLET | Freq: Every day | ORAL | 3 refills | Status: DC

## 2022-02-04 NOTE — Patient Instructions (Addendum)
Medication Instructions:  Stop Lipitor (atorvastatin) Start Crestor (rosuvastatin) 40 mg - one tablet daily  *If you need a refill on your cardiac medications before your next appointment, please call your pharmacy*   Lab Work: Today: cbc, bmet And then in 2 months - liver panel and lipids   Testing/Procedures: We will plan for closure procedure in a few weeks Looking at 03/06/22 as possible date [place], the nurse navigator will call you to confirm  Follow-Up: Per Structural Heart Team

## 2022-02-05 ENCOUNTER — Encounter: Payer: Self-pay | Admitting: Hematology & Oncology

## 2022-02-05 LAB — CBC
Hematocrit: 44.8 % (ref 37.5–51.0)
Hemoglobin: 14.5 g/dL (ref 13.0–17.7)
MCH: 30.3 pg (ref 26.6–33.0)
MCHC: 32.4 g/dL (ref 31.5–35.7)
MCV: 94 fL (ref 79–97)
Platelets: 265 10*3/uL (ref 150–450)
RBC: 4.78 x10E6/uL (ref 4.14–5.80)
RDW: 13.2 % (ref 11.6–15.4)
WBC: 6.4 10*3/uL (ref 3.4–10.8)

## 2022-02-05 LAB — BASIC METABOLIC PANEL
BUN/Creatinine Ratio: 12 (ref 9–20)
BUN: 11 mg/dL (ref 6–24)
CO2: 25 mmol/L (ref 20–29)
Calcium: 9.2 mg/dL (ref 8.7–10.2)
Chloride: 105 mmol/L (ref 96–106)
Creatinine, Ser: 0.89 mg/dL (ref 0.76–1.27)
Glucose: 119 mg/dL — ABNORMAL HIGH (ref 70–99)
Potassium: 4.1 mmol/L (ref 3.5–5.2)
Sodium: 143 mmol/L (ref 134–144)
eGFR: 102 mL/min/{1.73_m2} (ref >59)

## 2022-02-18 ENCOUNTER — Inpatient Hospital Stay: Payer: BC Managed Care – PPO

## 2022-02-18 ENCOUNTER — Inpatient Hospital Stay: Payer: BC Managed Care – PPO | Admitting: Hematology & Oncology

## 2022-02-20 ENCOUNTER — Inpatient Hospital Stay: Payer: BC Managed Care – PPO

## 2022-02-20 ENCOUNTER — Inpatient Hospital Stay (HOSPITAL_BASED_OUTPATIENT_CLINIC_OR_DEPARTMENT_OTHER): Payer: BC Managed Care – PPO | Admitting: Hematology & Oncology

## 2022-02-20 ENCOUNTER — Inpatient Hospital Stay: Payer: BC Managed Care – PPO | Attending: Hematology & Oncology

## 2022-02-20 ENCOUNTER — Encounter: Payer: Self-pay | Admitting: Hematology & Oncology

## 2022-02-20 VITALS — BP 126/86 | HR 97 | Temp 98.7°F | Resp 20 | Ht 71.5 in | Wt 269.0 lb

## 2022-02-20 DIAGNOSIS — Z5112 Encounter for antineoplastic immunotherapy: Secondary | ICD-10-CM | POA: Insufficient documentation

## 2022-02-20 DIAGNOSIS — C8223 Follicular lymphoma grade III, unspecified, intra-abdominal lymph nodes: Secondary | ICD-10-CM

## 2022-02-20 DIAGNOSIS — Z79899 Other long term (current) drug therapy: Secondary | ICD-10-CM | POA: Insufficient documentation

## 2022-02-20 DIAGNOSIS — Z7901 Long term (current) use of anticoagulants: Secondary | ICD-10-CM | POA: Diagnosis not present

## 2022-02-20 LAB — CBC WITH DIFFERENTIAL (CANCER CENTER ONLY)
Abs Immature Granulocytes: 0.02 10*3/uL (ref 0.00–0.07)
Basophils Absolute: 0 10*3/uL (ref 0.0–0.1)
Basophils Relative: 0 %
Eosinophils Absolute: 0.2 10*3/uL (ref 0.0–0.5)
Eosinophils Relative: 3 %
HCT: 41 % (ref 39.0–52.0)
Hemoglobin: 13.8 g/dL (ref 13.0–17.0)
Immature Granulocytes: 0 %
Lymphocytes Relative: 16 %
Lymphs Abs: 1.1 10*3/uL (ref 0.7–4.0)
MCH: 31.4 pg (ref 26.0–34.0)
MCHC: 33.7 g/dL (ref 30.0–36.0)
MCV: 93.2 fL (ref 80.0–100.0)
Monocytes Absolute: 0.8 10*3/uL (ref 0.1–1.0)
Monocytes Relative: 11 %
Neutro Abs: 4.8 10*3/uL (ref 1.7–7.7)
Neutrophils Relative %: 70 %
Platelet Count: 225 10*3/uL (ref 150–400)
RBC: 4.4 MIL/uL (ref 4.22–5.81)
RDW: 12.6 % (ref 11.5–15.5)
WBC Count: 6.8 10*3/uL (ref 4.0–10.5)
nRBC: 0 % (ref 0.0–0.2)

## 2022-02-20 LAB — CMP (CANCER CENTER ONLY)
ALT: 22 U/L (ref 0–44)
AST: 20 U/L (ref 15–41)
Albumin: 4.4 g/dL (ref 3.5–5.0)
Alkaline Phosphatase: 131 U/L — ABNORMAL HIGH (ref 38–126)
Anion gap: 8 (ref 5–15)
BUN: 12 mg/dL (ref 6–20)
CO2: 28 mmol/L (ref 22–32)
Calcium: 9.8 mg/dL (ref 8.9–10.3)
Chloride: 103 mmol/L (ref 98–111)
Creatinine: 1.05 mg/dL (ref 0.61–1.24)
GFR, Estimated: >60 mL/min (ref >60)
Glucose, Bld: 191 mg/dL — ABNORMAL HIGH (ref 70–99)
Potassium: 4.2 mmol/L (ref 3.5–5.1)
Sodium: 139 mmol/L (ref 135–145)
Total Bilirubin: 0.5 mg/dL (ref 0.3–1.2)
Total Protein: 6.7 g/dL (ref 6.5–8.1)

## 2022-02-20 LAB — LACTATE DEHYDROGENASE: LDH: 146 U/L (ref 98–192)

## 2022-02-20 MED ORDER — SODIUM CHLORIDE 0.9 % IV SOLN: Freq: Once | INTRAVENOUS | Status: AC

## 2022-02-20 MED ORDER — SODIUM CHLORIDE 0.9% FLUSH
10.0000 mL | INTRAVENOUS | Status: DC | PRN
  Administered 2022-02-20: 10 mL

## 2022-02-20 MED ORDER — SODIUM CHLORIDE 0.9 % IV SOLN
1000.0000 mg | Freq: Once | INTRAVENOUS | Status: AC
  Administered 2022-02-20: 1000 mg via INTRAVENOUS
  Filled 2022-02-20: qty 40

## 2022-02-20 MED ORDER — HEPARIN SOD (PORK) LOCK FLUSH 100 UNIT/ML IV SOLN
500.0000 [IU] | Freq: Once | INTRAVENOUS | Status: AC | PRN
  Administered 2022-02-20: 500 [IU]

## 2022-02-20 MED ORDER — SODIUM CHLORIDE 0.9 % IV SOLN
40.0000 mg | Freq: Once | INTRAVENOUS | Status: AC
  Administered 2022-02-20: 40 mg via INTRAVENOUS
  Filled 2022-02-20: qty 4

## 2022-02-20 MED ORDER — ACETAMINOPHEN 325 MG PO TABS
650.0000 mg | ORAL_TABLET | Freq: Once | ORAL | Status: AC
  Administered 2022-02-20: 650 mg via ORAL
  Filled 2022-02-20: qty 2

## 2022-02-20 MED ORDER — DIPHENHYDRAMINE HCL 50 MG/ML IJ SOLN
12.5000 mg | Freq: Once | INTRAMUSCULAR | Status: AC
  Administered 2022-02-20: 12.5 mg via INTRAVENOUS
  Filled 2022-02-20: qty 1

## 2022-02-20 NOTE — Patient Instructions (Signed)
Obinutuzumab Injection What is this medication? OBINUTUZUMAB (OH bi nue TOOZ ue mab) treats leukemia and lymphoma. It works by blocking a protein that causes cancer cells to grow and multiply. This helps to slow or stop the spread of cancer cells. It is a monoclonal antibody. This medicine may be used for other purposes; ask your health care provider or pharmacist if you have questions. COMMON BRAND NAME(S): GAZYVA What should I tell my care team before I take this medication? They need to know if you have any of these conditions: Heart disease Infection, especially a viral infection, such as hepatitis B Lung or breathing disease Take medications that treat or prevent blood clots An unusual or allergic reaction to obinutuzumab, other medications, foods, dyes, or preservatives Pregnant or trying to get pregnant Breastfeeding How should I use this medication? This medication is for infusion into a vein. It is given by a care team in a hospital or clinic setting. Talk to your care team about the use of this medication in children. Special care may be needed. Overdosage: If you think you have taken too much of this medicine contact a poison control center or emergency room at once. NOTE: This medicine is only for you. Do not share this medicine with others. What if I miss a dose? Keep appointments for follow-up doses as directed. It is important not to miss your dose. Call your care team if you are unable to keep an appointment. What may interact with this medication? Live virus vaccines This list may not describe all possible interactions. Give your health care provider a list of all the medicines, herbs, non-prescription drugs, or dietary supplements you use. Also tell them if you smoke, drink alcohol, or use illegal drugs. Some items may interact with your medicine. What should I watch for while using this medication? Report any side effects that you notice during your treatment right away,  such as changes in your breathing, fever, chills, dizziness or lightheadedness. These effects are more common with the first dose. Visit your care team for checks on your progress. You will need to have regular blood work. Report any other side effects. The side effects of this medication can continue after you finish your treatment. Continue your course of treatment even though you feel ill unless your care team tells you to stop. Call your care team for advice if you get a fever, chills or sore throat, or other symptoms of a cold or flu. Do not treat yourself. This medication decreases your body's ability to fight infections. Try to avoid being around people who are sick. This medication may increase your risk to bruise or bleed. Call your care team if you notice any unusual bleeding. Do not become pregnant while taking this medication or for 6 months after stopping it. Inform your care team if you wish to become pregnant or think you might be pregnant. There is a potential for serious side effects to an unborn child. Talk to your care team or pharmacist for more information. Do not breast-feed an infant while taking this medication or for 6 months after stopping it. What side effects may I notice from receiving this medication? Side effects that you should report to your care team as soon as possible: Allergic reactions--skin rash, itching, hives, swelling of the face, lips, tongue, or throat Bleeding--bloody or black, tar-like stools, vomiting blood or brown material that looks like coffee grounds, red or dark brown urine, small red or purple spots on skin, unusual bruising  or bleeding Blood clot--pain, swelling, or warmth in the leg, shortness of breath, chest pain Dizziness, loss of balance or coordination, confusion or trouble speaking Infection--fever, chills, cough, sore throat, wounds that don't heal, pain or trouble when passing urine, general feeling of discomfort or being unwell Infusion  reactions--chest pain, shortness of breath or trouble breathing, feeling faint or lightheaded Liver injury--right upper belly pain, loss of appetite, nausea, light-colored stool, dark yellow or brown urine, yellowing skin or eyes, unusual weakness or fatigue Tumor lysis syndrome (TLS)--nausea, vomiting, diarrhea, decrease in the amount of urine, dark urine, unusual weakness or fatigue, confusion, muscle pain or cramps, fast or irregular heartbeat, joint pain Side effects that usually do not require medical attention (report to your care team if they continue or are bothersome): Bone, joint, or muscle pain Constipation Diarrhea Fatigue Runny or stuffy nose Sore throat This list may not describe all possible side effects. Call your doctor for medical advice about side effects. You may report side effects to FDA at 1-800-FDA-1088. Where should I keep my medication? This medication is only given in a hospital or clinic and will not be stored at home. NOTE: This sheet is a summary. It may not cover all possible information. If you have questions about this medicine, talk to your doctor, pharmacist, or health care provider.  2023 Elsevier/Gold Standard (2021-06-03 00:00:00) Aprepitant Capsules What is this medication? APREPITANT (ap RE pi tant) prevents nausea and vomiting from chemotherapy. It works by blocking substances in your body that may cause nausea and vomiting. It belongs to a class of medications called antiemetics. This medicine may be used for other purposes; ask your health care provider or pharmacist if you have questions. COMMON BRAND NAME(S): Emend What should I tell my care team before I take this medication? They need to know if you have any of these conditions: Liver disease An unusual or allergic reaction to aprepitant, fosaprepitant, other medications, foods, dyes, or preservatives Pregnant or trying to get pregnant Breast-feeding How should I use this medication? Take  this medication by mouth with a glass of water. Follow the directions on the prescription label. Usually, you will take your first dose one hour before your chemotherapy begins, and then once daily in the morning for the next 2 days after your chemotherapy treatment. This medication may be taken with or without food. Do not take more often than directed. Talk to your care team about the use of this medication in children. While this medication may be prescribed for children as young as 12 years for selected conditions, precautions do apply. Overdosage: If you think you have taken too much of this medicine contact a poison control center or emergency room at once. NOTE: This medicine is only for you. Do not share this medicine with others. What if I miss a dose? If you miss a dose, take it as soon as you can. If it is almost time for your next dose, take only that dose. Do not take double or extra doses. What may interact with this medication? Do not take this medication with any of these: Cisapride Flibanserin Lomitapide Pimozide This medication may also interact with the following: Diltiazem Male hormones, like estrogens or progestins and birth control pills Medications for fungal infections like ketoconazole and itraconazole Medications for HIV Medications for seizures or to control epilepsy like carbamazepine or phenytoin Medications used for sleep or anxiety disorders like alprazolam, diazepam, or midazolam Nefazodone Paroxetine Ranolazine Rifampin Some chemotherapy medications like etoposide, ifosfamide, vinblastine,  vincristine Some antibiotics like clarithromycin, erythromycin, troleandomycin Steroid medications like dexamethasone or methylprednisolone Tolbutamide Warfarin This list may not describe all possible interactions. Give your health care provider a list of all the medicines, herbs, non-prescription drugs, or dietary supplements you use. Also tell them if you smoke,  drink alcohol, or use illegal drugs. Some items may interact with your medicine. What should I watch for while using this medication? Do not take this medication if you already have nausea and vomiting. Ask your care team what to do if you already have nausea. Birth control pills and other methods of hormonal contraception (for example, IUD or patch) may not work properly while you are taking this medication. Use an extra method of birth control during treatment and for 1 month after your last dose of aprepitant. This medication should not be used continuously for a long time. Visit your care team for regular check-ups. This medication may change your liver function blood test results. What side effects may I notice from receiving this medication? Side effects that you should report to your care team as soon as possible: Allergic reactions--skin rash, itching, hives, swelling of the face, lips, tongue, or throat Side effects that usually do not require medical attention (report to your care team if they continue or are bothersome): Dizziness Diarrhea Headache Hiccups Unusual weakness or fatigue Upset stomach This list may not describe all possible side effects. Call your doctor for medical advice about side effects. You may report side effects to FDA at 1-800-FDA-1088. Where should I keep my medication? Keep out of the reach of children. Store at room temperature between 20 and 25 degrees C (68 and 77 degrees F). Throw away any unused medication after the expiration date. NOTE: This sheet is a summary. It may not cover all possible information. If you have questions about this medicine, talk to your doctor, pharmacist, or health care provider.  2023 Elsevier/Gold Standard (2020-09-18 00:00:00)

## 2022-02-20 NOTE — Progress Notes (Signed)
Hematology and Oncology Follow Up Visit  James Jennings [id number] 03/18/1970 52 y.o. 02/20/2022   Principle Diagnosis:  Follicular B- cell non-Hodgkin's lymphoma - Relapsed   Past Therapy:             Rituxan/bendamustine-s/p cycle 6 - completed on 07/03/2016   Current Therapy:  Gazyva  - Cytoxan/ Vincristine/Prednisone  - started 11/07/2019, s/p cycle #4 --  D/c on 03/26/2020 G-CHOP -- s/p cycle #6-- started on 04/03/2020 -completed on 07/13/2020 Maintenance Gazyva-s/p cycle 8/12 --  start on 09/05/2020    Interim History:  James Jennings is here today for follow-up.  He is going undergo a closure of a PFO in 2 weeks.  I do not see a problem with him having this.  The Gazyva should have no effect on this.  He otherwise, he is doing okay.  He had no problems over the Holiday season.  He is on anticoagulation.  He is doing well with this.  He is not having any problems with bleeding.  He has had no fever.  He has had no problems with COVID.  There is no change in bowel or bladder habits.  He has had some issues with his blood sugars.  They have been on the high side.  He has had little bit of a rash.  I think he may have some eczema.  Currently, I would say that his performance status is probably ECOG 0.     Medications:  Allergies as of 02/20/2022       Reactions   Mango Flavor Swelling, Other (See Comments)   LIPS SWELL   Rituximab Other (See Comments)   Blood pressure went extremely low.    Shellfish Allergy Anaphylaxis   Lactose Intolerance (gi) Diarrhea        Medication List        Accurate as of February 20, 2022 10:00 AM. If you have any questions, ask your nurse or doctor.          acetaminophen 325 MG tablet Commonly known as: TYLENOL Take 650 mg by mouth daily as needed for moderate pain or headache.   ALPRAZolam 0.25 MG tablet Commonly known as: XANAX Take 0.25 mg by mouth 2 (two) times daily as needed.   aspirin EC 81 MG tablet Take 1 tablet (81 mg  total) by mouth daily. Swallow whole.   cetirizine 10 MG tablet Commonly known as: ZYRTEC Take 10 mg by mouth daily.   clopidogrel 75 MG tablet Commonly known as: PLAVIX Take 1 tablet (75 mg total) by mouth daily.   Farxiga 10 MG Tabs tablet Generic drug: dapagliflozin propanediol Take 10 mg by mouth daily.   lidocaine-prilocaine cream Commonly known as: EMLA Apply 1 application topically as needed. Apply to [facility] 1 hour prior to procedure.   metFORMIN 500 MG tablet Commonly known as: GLUCOPHAGE Take 1 tablet (500 mg total) by mouth daily.   metoprolol succinate 25 MG 24 hr tablet Commonly known as: Toprol XL Take 1 tablet (25 mg total) by mouth daily. Can take additional half tablet if heart rate is over 100   multivitamin Tabs tablet Take 1 tablet by mouth daily.   polycarbophil 625 MG tablet Commonly known as: FIBERCON Take 625 mg by mouth daily.   rosuvastatin 40 MG tablet Commonly known as: CRESTOR Take 1 tablet (40 mg total) by mouth daily.        Allergies:  Allergies  Allergen Reactions   Mango Flavor Swelling and Other (See Comments)  LIPS SWELL   Rituximab Other (See Comments)    Blood pressure went extremely low.    Shellfish Allergy Anaphylaxis   Lactose Intolerance (Gi) Diarrhea    Past Medical History, Surgical history, Social history, and Family History were reviewed and updated.  Review of Systems: Review of Systems  Constitutional: Negative.   HENT: Negative.    Eyes: Negative.   Respiratory: Negative.    Cardiovascular: Negative.   Gastrointestinal: Negative.   Genitourinary: Negative.   Musculoskeletal: Negative.   Skin: Negative.   Neurological: Negative.   Endo/Heme/Allergies: Negative.   Psychiatric/Behavioral: Negative.       Physical Exam:  height is 5' 11.5" (1.816 m) and weight is 269 lb (122 kg). His oral temperature is 98.7 F (37.1 C). His blood pressure is 126/86 and his pulse is 97. His respiration is 20 and  oxygen saturation is 96%.   Wt Readings from Last 3 Encounters:  02/20/22 269 lb (122 kg)  02/04/22 271 lb 12.8 oz (123.3 kg)  01/29/22 270 lb (122.5 kg)  His vital signs are temperature of 98.3.  Pulse 86.  Blood pressure 109/76.  Weight is 274 pounds.  Physical Exam Vitals reviewed.  HENT:     Head: Normocephalic and atraumatic.  Eyes:     Pupils: Pupils are equal, round, and reactive to light.  Cardiovascular:     Rate and Rhythm: Normal rate and regular rhythm.     Heart sounds: Normal heart sounds.  Pulmonary:     Effort: Pulmonary effort is normal.     Breath sounds: Normal breath sounds.  Abdominal:     General: Bowel sounds are normal.     Palpations: Abdomen is soft.  Musculoskeletal:        General: No tenderness or deformity. Normal range of motion.     Cervical back: Normal range of motion.  Lymphadenopathy:     Cervical: No cervical adenopathy.  Skin:    General: Skin is warm and dry.     Findings: No erythema or rash.  Neurological:     Mental Status: He is alert and oriented to person, place, and time.  Psychiatric:        Behavior: Behavior normal.        Thought Content: Thought content normal.        Judgment: Judgment normal.      Lab Results  Component Value Date   WBC 6.8 02/20/2022   HGB 13.8 02/20/2022   HCT 41.0 02/20/2022   MCV 93.2 02/20/2022   PLT 225 02/20/2022   No results found for: "FERRITIN", "IRON", "TIBC", "UIBC", "IRONPCTSAT" Lab Results  Component Value Date   RETICCTPCT 1.07 12/06/2015   RBC 4.40 02/20/2022   RETICCTABS 48.79 12/06/2015   No results found for: "KPAFRELGTCHN", "LAMBDASER", "KAPLAMBRATIO" Lab Results  Component Value Date   IGGSERUM 585 (L) 12/19/2021   IGA 133 12/19/2021   IGMSERUM 12 (L) 12/19/2021   Lab Results  Component Value Date   TOTALPROTELP 6.1 12/25/2020   ALBUMINELP 3.7 12/25/2020   A1GS 0.2 12/25/2020   A2GS 0.7 12/25/2020   BETS 1.0 12/25/2020   GAMS 0.5 12/25/2020   MSPIKE Not  Observed 12/25/2020     Chemistry      Component Value Date/Time   NA 139 02/20/2022 0918   NA 143 02/04/2022 1506   NA 141 12/03/2016 1155   NA 141 02/07/2016 1149   K 4.2 02/20/2022 0918   K 3.8 12/03/2016 1155   K 4.3 02/07/2016  1149   CL 103 02/20/2022 0918   CL 100 12/03/2016 1155   CO2 28 02/20/2022 0918   CO2 27 12/03/2016 1155   CO2 27 02/07/2016 1149   BUN 12 02/20/2022 0918   BUN 11 02/04/2022 1506   BUN 10 12/03/2016 1155   BUN 10.9 02/07/2016 1149   CREATININE 1.05 02/20/2022 0918   CREATININE 1.0 12/03/2016 1155   CREATININE 0.9 02/07/2016 1149      Component Value Date/Time   CALCIUM 9.8 02/20/2022 0918   CALCIUM 9.1 12/03/2016 1155   CALCIUM 9.6 02/07/2016 1149   ALKPHOS 131 (H) 02/20/2022 0918   ALKPHOS 99 (H) 12/03/2016 1155   ALKPHOS 127 02/07/2016 1149   AST 20 02/20/2022 0918   AST 17 02/07/2016 1149   ALT 22 02/20/2022 0918   ALT 24 12/03/2016 1155   ALT 16 02/07/2016 1149   BILITOT 0.5 02/20/2022 0918   BILITOT 0.42 02/07/2016 1149       Impression and Plan: Mr. Lockridge is a pleasant 52 yo caucasian gentleman with history of follicular large cell non-Hodgkin's lymphoma. He completed 6 cycles of chemotherapy with bendamustine on 07/03/2016 but was not tolerant of Rituxan (anaphylaxis). His lymphoma has  recurred.  We then treated him with CHOP.  This got him into remission.  He is on maintenance Gazyva now.  We will go ahead with his 9th cycle of treatment.  He will have a total of 12 cycles.  We are going every 2 months.  I do not see a problem with him having the closure of the PFO.  As always, we will follow him up in 2 months.  I do not see that we need to do any scans on him.   [name], MD 1/19/202410:00 AM

## 2022-02-20 NOTE — Patient Instructions (Signed)

## 2022-02-21 LAB — BETA 2 MICROGLOBULIN, SERUM: Beta-2 Microglobulin: 1.4 mg/L (ref 0.6–2.4)

## 2022-03-03 DIAGNOSIS — G4733 Obstructive sleep apnea (adult) (pediatric): Secondary | ICD-10-CM | POA: Diagnosis not present

## 2022-03-05 ENCOUNTER — Telehealth: Payer: Self-pay | Admitting: *Deleted

## 2022-03-05 NOTE — Telephone Encounter (Addendum)
PFO  scheduled at [facility] for: Friday March 06, 2022 7:30 AM Arrival time and place: [place] Entrance A at: 5:30 AM  Nothing to eat after midnight prior to procedure, clear liquids until 5 AM day of procedure.  Medication instructions: -Hold:  Metformin/Farxiga-AM of procedure -Except hold medications usual morning medications can be taken with sips of water including aspirin 81 mg and Plavix 75 mg.  Confirmed patient has responsible adult to drive home post procedure and be with patient first 24 hours after arriving home.  Patient reports no new symptoms concerning for COVID-19 in the past 10 days.  Reviewed procedure instructions with patient.

## 2022-03-06 ENCOUNTER — Other Ambulatory Visit: Payer: Self-pay

## 2022-03-06 ENCOUNTER — Other Ambulatory Visit: Payer: Self-pay | Admitting: Cardiology

## 2022-03-06 ENCOUNTER — Ambulatory Visit (HOSPITAL_COMMUNITY)
Admission: RE | Disposition: A | Discharge status: Home / Self Care | Payer: Self-pay | Source: Home / Self Care | Attending: Internal Medicine

## 2022-03-06 ENCOUNTER — Ambulatory Visit (HOSPITAL_COMMUNITY)
Admission: RE | Admit: 2022-03-06 | Discharge: 2022-03-06 | Disposition: A | Discharge status: Home / Self Care | Payer: BC Managed Care – PPO | Attending: Internal Medicine | Admitting: Internal Medicine

## 2022-03-06 DIAGNOSIS — I10 Essential (primary) hypertension: Secondary | ICD-10-CM | POA: Diagnosis not present

## 2022-03-06 DIAGNOSIS — I639 Cerebral infarction, unspecified: Secondary | ICD-10-CM

## 2022-03-06 DIAGNOSIS — Q2112 Patent foramen ovale: Secondary | ICD-10-CM

## 2022-03-06 DIAGNOSIS — C8510 Unspecified B-cell lymphoma, unspecified site: Secondary | ICD-10-CM | POA: Diagnosis not present

## 2022-03-06 DIAGNOSIS — Z79899 Other long term (current) drug therapy: Secondary | ICD-10-CM | POA: Insufficient documentation

## 2022-03-06 DIAGNOSIS — E785 Hyperlipidemia, unspecified: Secondary | ICD-10-CM | POA: Insufficient documentation

## 2022-03-06 DIAGNOSIS — I63432 Cerebral infarction due to embolism of left posterior cerebral artery: Secondary | ICD-10-CM

## 2022-03-06 DIAGNOSIS — I700 Atherosclerosis of aorta: Secondary | ICD-10-CM | POA: Insufficient documentation

## 2022-03-06 DIAGNOSIS — Z7982 Long term (current) use of aspirin: Secondary | ICD-10-CM | POA: Insufficient documentation

## 2022-03-06 DIAGNOSIS — Z87891 Personal history of nicotine dependence: Secondary | ICD-10-CM | POA: Insufficient documentation

## 2022-03-06 DIAGNOSIS — Z7984 Long term (current) use of oral hypoglycemic drugs: Secondary | ICD-10-CM | POA: Insufficient documentation

## 2022-03-06 DIAGNOSIS — Z8673 Personal history of transient ischemic attack (TIA), and cerebral infarction without residual deficits: Secondary | ICD-10-CM | POA: Insufficient documentation

## 2022-03-06 DIAGNOSIS — I472 Ventricular tachycardia, unspecified: Secondary | ICD-10-CM | POA: Insufficient documentation

## 2022-03-06 HISTORY — PX: PATENT FORAMEN OVALE(PFO) CLOSURE: CATH118300

## 2022-03-06 LAB — GLUCOSE, CAPILLARY
Glucose-Capillary: 133 mg/dL — ABNORMAL HIGH (ref 70–99)
Glucose-Capillary: 151 mg/dL — ABNORMAL HIGH (ref 70–99)

## 2022-03-06 LAB — POCT ACTIVATED CLOTTING TIME
Activated Clotting Time: 260 seconds
Activated Clotting Time: 255 seconds
Activated Clotting Time: 266 seconds
Activated Clotting Time: 271 seconds

## 2022-03-06 SURGERY — PATENT FORAMEN OVALE (PFO) CLOSURE
Anesthesia: LOCAL

## 2022-03-06 MED ORDER — HEPARIN (PORCINE) IN NACL 1000-0.9 UT/500ML-% IV SOLN
INTRAVENOUS | Status: DC | PRN
  Administered 2022-03-06 (×3): 500 mL

## 2022-03-06 MED ORDER — SODIUM CHLORIDE 0.9 % WEIGHT BASED INFUSION: 1.0000 mL/kg/h | INTRAVENOUS | Status: DC

## 2022-03-06 MED ORDER — FENTANYL CITRATE (PF) 100 MCG/2ML IJ SOLN
INTRAMUSCULAR | Status: AC
  Filled 2022-03-06: qty 2

## 2022-03-06 MED ORDER — LIDOCAINE HCL (PF) 1 % IJ SOLN
INTRAMUSCULAR | Status: AC
  Filled 2022-03-06: qty 30

## 2022-03-06 MED ORDER — MIDAZOLAM HCL 2 MG/2ML IJ SOLN
INTRAMUSCULAR | Status: DC | PRN
  Administered 2022-03-06 (×7): 1 mg via INTRAVENOUS

## 2022-03-06 MED ORDER — CEFAZOLIN IN SODIUM CHLORIDE 3-0.9 GM/100ML-% IV SOLN
3.0000 g | INTRAVENOUS | Status: DC
  Filled 2022-03-06: qty 100

## 2022-03-06 MED ORDER — ONDANSETRON HCL 4 MG/2ML IJ SOLN: 4.0000 mg | Freq: Four times a day (QID) | INTRAMUSCULAR | Status: DC | PRN

## 2022-03-06 MED ORDER — HEPARIN (PORCINE) IN NACL 1000-0.9 UT/500ML-% IV SOLN
INTRAVENOUS | Status: AC
  Filled 2022-03-06: qty 500

## 2022-03-06 MED ORDER — MIDAZOLAM HCL 2 MG/2ML IJ SOLN
INTRAMUSCULAR | Status: AC
  Filled 2022-03-06: qty 2

## 2022-03-06 MED ORDER — HEPARIN SODIUM (PORCINE) 1000 UNIT/ML IJ SOLN
INTRAMUSCULAR | Status: AC
  Filled 2022-03-06: qty 10

## 2022-03-06 MED ORDER — HEPARIN SODIUM (PORCINE) 1000 UNIT/ML IJ SOLN
INTRAMUSCULAR | Status: DC | PRN
  Administered 2022-03-06: 1000 [IU] via INTRAVENOUS
  Administered 2022-03-06: 3000 [IU] via INTRAVENOUS
  Administered 2022-03-06: 11000 [IU] via INTRAVENOUS
  Administered 2022-03-06: 3000 [IU] via INTRAVENOUS
  Administered 2022-03-06: 2000 [IU] via INTRAVENOUS

## 2022-03-06 MED ORDER — CLOPIDOGREL BISULFATE 75 MG PO TABS: 75.0000 mg | ORAL_TABLET | ORAL | Status: DC

## 2022-03-06 MED ORDER — HYDRALAZINE HCL 20 MG/ML IJ SOLN: 10.0000 mg | INTRAMUSCULAR | Status: AC | PRN

## 2022-03-06 MED ORDER — SODIUM CHLORIDE 0.9% FLUSH: 3.0000 mL | Freq: Two times a day (BID) | INTRAVENOUS | Status: DC

## 2022-03-06 MED ORDER — ASPIRIN 81 MG PO CHEW: 81.0000 mg | CHEWABLE_TABLET | ORAL | Status: DC

## 2022-03-06 MED ORDER — LIDOCAINE-EPINEPHRINE 1 %-1:100000 IJ SOLN
INTRAMUSCULAR | Status: AC
  Filled 2022-03-06: qty 1

## 2022-03-06 MED ORDER — SODIUM CHLORIDE 0.9 % IV SOLN
INTRAVENOUS | Status: DC | PRN
  Administered 2022-03-06: 250 mL via INTRAVENOUS

## 2022-03-06 MED ORDER — SODIUM CHLORIDE 0.9 % IV SOLN: 250.0000 mL | INTRAVENOUS | Status: DC | PRN

## 2022-03-06 MED ORDER — FENTANYL CITRATE (PF) 100 MCG/2ML IJ SOLN
INTRAMUSCULAR | Status: DC | PRN
  Administered 2022-03-06 (×7): 25 ug via INTRAVENOUS

## 2022-03-06 MED ORDER — CLOPIDOGREL BISULFATE 75 MG PO TABS: 75.0000 mg | ORAL_TABLET | Freq: Every day | ORAL | Status: DC

## 2022-03-06 MED ORDER — LIDOCAINE HCL (PF) 1 % IJ SOLN
INTRAMUSCULAR | Status: DC | PRN
  Administered 2022-03-06 (×3): 10 mL

## 2022-03-06 MED ORDER — LABETALOL HCL 5 MG/ML IV SOLN: 10.0000 mg | INTRAVENOUS | Status: AC | PRN

## 2022-03-06 MED ORDER — SODIUM CHLORIDE 0.9% FLUSH: 3.0000 mL | INTRAVENOUS | Status: DC | PRN

## 2022-03-06 MED ORDER — IOHEXOL 350 MG/ML SOLN
INTRAVENOUS | Status: DC | PRN
  Administered 2022-03-06: 80 mL

## 2022-03-06 MED ORDER — LIDOCAINE-EPINEPHRINE 1 %-1:100000 IJ SOLN
INTRAMUSCULAR | Status: DC | PRN
  Administered 2022-03-06: 1.5 mL

## 2022-03-06 MED ORDER — SODIUM CHLORIDE 0.9 % WEIGHT BASED INFUSION
3.0000 mL/kg/h | INTRAVENOUS | Status: AC
  Administered 2022-03-06: 3 mL/kg/h via INTRAVENOUS

## 2022-03-06 MED ORDER — ACETAMINOPHEN 325 MG PO TABS
650.0000 mg | ORAL_TABLET | ORAL | Status: DC | PRN
  Administered 2022-03-06: 650 mg via ORAL
  Filled 2022-03-06: qty 2

## 2022-03-06 MED ORDER — ASPIRIN 81 MG PO CHEW: 81.0000 mg | CHEWABLE_TABLET | Freq: Every day | ORAL | Status: DC

## 2022-03-06 SURGICAL SUPPLY — 30 items
BALLN SIZING AMPLATZER 34 (BALLOONS) ×1
BALLOON SIZING AMPLATZER 34 (BALLOONS) IMPLANT
CATH 8FR ACUNAV REPROCESSED (CATHETERS) IMPLANT
CATH ANGIO 5F BER2 100CM (CATHETERS) IMPLANT
CATH DIAG 6FR JR4 (CATHETERS) IMPLANT
CATH INFINITI 6F AR1 (CATHETERS) IMPLANT
CATH REPROCESSED 8FR ACUNAV (CATHETERS) ×1 IMPLANT
CATH SUPER TORQUE PLUS 6F MPA1 (CATHETERS) IMPLANT
CLOSURE PERCLOSE PROSTYLE (VASCULAR PRODUCTS) IMPLANT
DEVICE TORQUE .025-.038 (MISCELLANEOUS) IMPLANT
DRYSEAL FLEXSHEATH 16FR 33CM (SHEATH) ×1
GLIDEWIRE ADV .035X260CM (WIRE) IMPLANT
GUIDEWIRE ANGLED .035X260CM (WIRE) IMPLANT
KIT HEART LEFT (KITS) ×1 IMPLANT
OCCLUDER CARDIOFORM SEPTAL 30 (Prosthesis & Implant Heart) IMPLANT
PACK CARDIAC CATHETERIZATION (CUSTOM PROCEDURE TRAY) ×1 IMPLANT
PINNACLE LONG 7F 25CM (SHEATH) ×1
SHEATH DRYSEAL FLEX 16FR 33CM (SHEATH) IMPLANT
SHEATH INTRO PINNACLE 7F 25CM (SHEATH) IMPLANT
SHEATH INTROD W/O MIN 9FR 25CM (SHEATH) IMPLANT
SHEATH PINNACLE 6F 10CM (SHEATH) IMPLANT
SHEATH PINNACLE 8F 10CM (SHEATH) IMPLANT
SHEATH PROBE COVER 6X72 (BAG) IMPLANT
TRANSDUCER W/STOPCOCK (MISCELLANEOUS) ×1 IMPLANT
TUBING CIL FLEX 10 FLL-RA (TUBING) ×1 IMPLANT
WIRE EMERALD 3MM-J .025X260CM (WIRE) IMPLANT
WIRE EMERALD 3MM-J .035X150CM (WIRE) IMPLANT
WIRE EMERALD 3MM-J .035X260CM (WIRE) IMPLANT
WIRE MICRO SET SILHO 5FR 7 (SHEATH) IMPLANT
WIRE SAFARI SM CURVE 275 (WIRE) IMPLANT

## 2022-03-06 NOTE — Progress Notes (Signed)
Dr. Shellia Cleverly at bedside. Injection at Right groin site given by MD. Will keep pt flat for 45 min then if no bleeding at site will allow pt to discharge

## 2022-03-06 NOTE — Interval H&P Note (Signed)
History and Physical Interval Note:  03/06/2022 6:29 AM  James Jennings  has presented today for surgery, with the diagnosis of pfo.  The various methods of treatment have been discussed with the patient and family. After consideration of risks, benefits and other options for treatment, the patient has consented to  Procedure(s): PATENT FORAMEN OVALE(PFO) CLOSURE (N/A) as a surgical intervention.  The patient's history has been reviewed, patient examined, no change in status, stable for surgery.  I have reviewed the patient's chart and labs.  Questions were answered to the patient's satisfaction.     Early Osmond

## 2022-03-06 NOTE — Progress Notes (Signed)
Per dr. Scharlene Corn to ambulate and then discharge pt.  Pt ambulated to and from bathroom with no issues

## 2022-03-06 NOTE — Discharge Instructions (Addendum)
Continue aspirin and plavix for 6 months then aspirin '81mg'$  only You will need antibiotics for any dental work (including cleanings) for 6 months

## 2022-03-06 NOTE — Progress Notes (Signed)
Call Dr. Ali Lowe to report continued oozing. Per Dr. Ali Lowe called cath lab team to inject meds at site. will continue to monitor

## 2022-03-08 ENCOUNTER — Other Ambulatory Visit: Payer: Self-pay

## 2022-03-09 ENCOUNTER — Encounter (HOSPITAL_COMMUNITY): Payer: Self-pay | Admitting: Internal Medicine

## 2022-03-11 MED FILL — Heparin Sod (Porcine)-NaCl IV Soln 1000 Unit/500ML-0.9%: INTRAVENOUS | Qty: 500 | Status: AC

## 2022-03-12 ENCOUNTER — Other Ambulatory Visit: Payer: Self-pay

## 2022-03-12 NOTE — Patient Outreach (Signed)
First telephone outreach attempt to obtain mRS. No answer. Left message for returned call.  Syna Gad [facility] Management Assistant [phone]  

## 2022-03-16 ENCOUNTER — Other Ambulatory Visit: Payer: Self-pay | Admitting: Internal Medicine

## 2022-03-16 ENCOUNTER — Telehealth: Payer: Self-pay | Admitting: Internal Medicine

## 2022-03-16 ENCOUNTER — Ambulatory Visit: Payer: BC Managed Care – PPO | Attending: Internal Medicine

## 2022-03-16 DIAGNOSIS — I4729 Other ventricular tachycardia: Secondary | ICD-10-CM

## 2022-03-16 DIAGNOSIS — R002 Palpitations: Secondary | ICD-10-CM

## 2022-03-16 DIAGNOSIS — I639 Cerebral infarction, unspecified: Secondary | ICD-10-CM

## 2022-03-16 DIAGNOSIS — Q2112 Patent foramen ovale: Secondary | ICD-10-CM

## 2022-03-16 DIAGNOSIS — I63432 Cerebral infarction due to embolism of left posterior cerebral artery: Secondary | ICD-10-CM

## 2022-03-16 NOTE — Telephone Encounter (Signed)
Called and explained what the MD advised : Please set him up with a 14 day monitor . Placed order and will forward to device clinic. Patient voiced understanding.

## 2022-03-16 NOTE — Telephone Encounter (Signed)
Patient stated his heart rate is ranging between 48/133 ( on his apple watch), he does feel a little lightheaded and some mild headaches.Patient has experiencing these symptoms since last week Wednesday. He had an office visit on 12/13 and was advised  Metoprolol 37m Once a day and you can take an additional half tablet if your heart rate is over 100.  Patient has not taken the additional metoprolol due to his pulse does not remain elevated. Will forward to MD and RN.

## 2022-03-16 NOTE — Telephone Encounter (Signed)
STAT if HR is under 50 or over 120 (normal HR is 60-100 beats per minute)  What is your heart rate? 33 - now  Do you have a log of your heart rate readings (document readings)? 40-140  (in the afternoons)  Do you have any other symptoms? Lightheaded    Pt had PSO Closure procedure last week

## 2022-03-16 NOTE — Progress Notes (Unsigned)
Enrolled for [employer] to mail a ZIO XT long term holter monitor to the patients address on file.  

## 2022-03-19 ENCOUNTER — Other Ambulatory Visit: Payer: Self-pay

## 2022-03-19 DIAGNOSIS — I6930 Unspecified sequelae of cerebral infarction: Secondary | ICD-10-CM | POA: Diagnosis not present

## 2022-03-19 DIAGNOSIS — I4729 Other ventricular tachycardia: Secondary | ICD-10-CM | POA: Diagnosis not present

## 2022-03-19 DIAGNOSIS — E119 Type 2 diabetes mellitus without complications: Secondary | ICD-10-CM | POA: Diagnosis not present

## 2022-03-19 NOTE — Patient Outreach (Signed)
Telephone outreach to patient to obtain mRS, patient called back and successfully completed. MRS= [place] Management Assistant [phone]

## 2022-03-20 DIAGNOSIS — I63432 Cerebral infarction due to embolism of left posterior cerebral artery: Secondary | ICD-10-CM

## 2022-03-20 DIAGNOSIS — I639 Cerebral infarction, unspecified: Secondary | ICD-10-CM

## 2022-03-20 DIAGNOSIS — I4729 Other ventricular tachycardia: Secondary | ICD-10-CM

## 2022-03-20 DIAGNOSIS — Q2112 Patent foramen ovale: Secondary | ICD-10-CM | POA: Diagnosis not present

## 2022-03-20 DIAGNOSIS — R002 Palpitations: Secondary | ICD-10-CM | POA: Diagnosis not present

## 2022-03-23 ENCOUNTER — Other Ambulatory Visit: Payer: Self-pay

## 2022-04-03 DIAGNOSIS — F410 Panic disorder [episodic paroxysmal anxiety] without agoraphobia: Secondary | ICD-10-CM | POA: Diagnosis not present

## 2022-04-03 DIAGNOSIS — F411 Generalized anxiety disorder: Secondary | ICD-10-CM | POA: Diagnosis not present

## 2022-04-03 NOTE — Progress Notes (Unsigned)
[facility]                                     Cardiology Office Note:    Date:  04/06/2022   ID:  James Jennings, DOB [birth year], MRN [id number]  PCP:  James Lopes, MD  [facility] Cardiologist:  None  [facility] Electrophysiologist:  None   Referring MD: James Lopes, MD   Chief Complaint  Patient presents with   Follow-up    S/p PFO closure    History of Present Illness:    James Jennings is a 52 y.o. male with a hx of left PCA ischemic CVA, PFO with atrial septal aneurysm, HTN, aortic atherosclerosis, non-Hodgkin's lymphoma disease in remission), hyperlipidemia, DM type II, sleep apnea who is now s/p PFO closure and being seen today for follow up.   James Jennings was initially seen by Dr. Marlou Jennings for the evaluation of PVCs and tachycardia. He presented to the ED 12/01/2021 with acute stroke symptoms treated with IV TNK. He was started on Plavix and ASA. 2D echo was completed 12/02/2021 with EF 60-65%.  He underwent TEE on 11/2 that showed small PFO with aneurysmal septum. He was referred to structural heart and was seen by James Jennings for evaluation. He was given a 30-day event monitor to rule out possible atrial fibrillation and plan for transcatheter closure if no AF is present. Monitoring device company sent alert on 12/12 indicating episode of VT. Beta blocker was initiated.   He is now s/p PFO closure with successful PFO closure with 45m Gore Cardioform device with recommendations for DAPT with ASA and Plavix for 6 months and then aspirin monotherapy indefinitely. He will require dental SBE for 6 months.    Today he is here with his wife. He has been feeling good since implant. He denies chest pain, groin issues such as bleeding or swelling. No SOB, LE edema, orthopnea, dizziness, or syncope. He does report intermittent palpitations. This has improved however he is currently wearing a ZIO XT monitor.   Past Medical  History:  Diagnosis Date   Anxiety    Bilateral swelling of feet    Diabetes mellitus without complication ([name])    High cholesterol    Joint pain    Lactose intolerance    Large cell, follicular non-Hodgkin's lymphoma ([name])    Lymphoma, follicular (HTusculum dx'd [id number]  Multiple food allergies    Other fatigue    Palpitations    PONV (postoperative nausea and vomiting)    Pre-diabetes    Prediabetes    Shortness of breath on exertion    Sleep apnea    "dx'd ~ 2008; never [id number]" (02/28/2016)    Past Surgical History:  Procedure Laterality Date   ACHILLES TENDON SURGERY Left ~ 2012   APPENDECTOMY  11/12/2015   lap appy   BUBBLE STUDY  12/04/2021   Procedure: BUBBLE STUDY;  Surgeon: James Dresser MD;  [facility]  Service: Cardiovascular;;   CLUB FOOT RELEASE Bilateral 1TaftLYMPH NODE BIOPSY Right 01/20/2016   Procedure: EXCISIONAL BIOPSY OF RIGHT INGUINAL LYMPH NODE;  Surgeon: EGreer Pickerel MD;  [facility]  Service: General;  Laterality: Right;   IR IMAGING GUIDED PORT INSERTION  11/02/2019   LAPAROSCOPIC APPENDECTOMY N/A 11/12/2015   Procedure: APPENDECTOMY LAPAROSCOPIC;  Surgeon:  James Pickerel, MD;  Location: [facility];  Service: General;  Laterality: N/A;   LYMPH NODE BIOPSY Left 03/20/2020   Procedure: EXCISIONAL BIOPSY LEFT INGUINAL LYMPH NODE;  Surgeon: James Keens, MD;  Location: [facility];  Service: General;  Laterality: Left;  LMA   PATENT FORAMEN OVALE(PFO) CLOSURE N/A 03/06/2022   Procedure: PATENT FORAMEN OVALE(PFO) CLOSURE;  Surgeon: James Osmond, MD;  Location: [facility];  Service: Cardiovascular;  Laterality: N/A;   SUTURE REMOVAL Left ~ 2016-2017 X 3   "had to use permanent sutures w/my achilles OR; my body rejects them at times & I have to have them surgically removed; under anesthesia"   TEE WITHOUT CARDIOVERSION N/A 12/04/2021   Procedure: TRANSESOPHAGEAL ECHOCARDIOGRAM (TEE);  Surgeon:  James Dresser, MD;  Location: [facility];  Service: Cardiovascular;  Laterality: N/A;   VASECTOMY Bilateral 07/2019   VENTRAL HERNIA REPAIR  1994    Current Medications: Current Meds  Medication Sig   acetaminophen (TYLENOL) 325 MG tablet Take 650 mg by mouth daily as needed for moderate pain or headache.   ALPRAZolam (XANAX) 0.25 MG tablet Take 0.25 mg by mouth 2 (two) times daily as needed for anxiety.   aspirin EC 81 MG tablet Take 1 tablet (81 mg total) by mouth daily. Swallow whole.   cetirizine (ZYRTEC) 10 MG tablet Take 10 mg by mouth daily.   escitalopram (LEXAPRO) 5 MG tablet Take 5 mg by mouth daily.   FARXIGA 10 MG TABS tablet Take 10 mg by mouth daily.   lidocaine-prilocaine (EMLA) cream Apply 1 application topically as needed. Apply to [facility] 1 hour prior to procedure.   metFORMIN (GLUCOPHAGE) 500 MG tablet Take 1 tablet (500 mg total) by mouth daily.   metoprolol succinate (TOPROL XL) 25 MG 24 hr tablet Take 1 tablet (25 mg total) by mouth daily. Can take additional half tablet if heart rate is over 100   multivitamin (ONE-A-DAY MEN'S) TABS tablet Take 1 tablet by mouth daily.   polycarbophil (FIBERCON) 625 MG tablet Take 625 mg by mouth daily.   rosuvastatin (CRESTOR) 40 MG tablet Take 1 tablet (40 mg total) by mouth daily.   [DISCONTINUED] clopidogrel (PLAVIX) 75 MG tablet Take 1 tablet (75 mg total) by mouth daily.     Allergies:   Mango flavor, Rituximab, Shellfish allergy, and Lactose intolerance (gi)   Social History   Socioeconomic History   Marital status: Married    Spouse name: James Jennings   Number of children: 1   Years of education: Not on file   Highest education level: Not on file  Occupational History   Occupation: auditor  Tobacco Use   Smoking status: Former    Packs/day: 1.00    Years: 30.00    Total pack years: 30.00    Types: Cigarettes    Quit date: 05/05/2017    Years since quitting: 4.9   Smokeless tobacco: Former    Types:  Snuff    Quit date: 1997   Tobacco comments:    11/12/2015 "quit using chew in ~ 1997"  Vaping Use   Vaping Use: Never used  Substance and Sexual Activity   Alcohol use: Not Currently   Drug use: Not Currently    Types: Marijuana    Comment: "in the [place] 1990s; recreational"   Sexual activity: Yes    Partners: Female  Other Topics Concern   Not on file  Social History Narrative   Not on file   Social Determinants of Health  Financial Resource Strain: Not on file  Food Insecurity: No Food Insecurity (12/03/2021)   Hunger Vital Sign    Worried About Running Out of Food in the Last Year: Never true    Ran Out of Food in the Last Year: Never true  Transportation Needs: No Transportation Needs (12/03/2021)   PRAPARE - Hydrologist (Medical): No    Lack of Transportation (Non-Medical): No  Physical Activity: Not on file  Stress: Not on file  Social Connections: Not on file    Family History: The patient's family history is negative for Colon cancer, Colon polyps, Esophageal cancer, Stomach cancer, and Rectal cancer.  ROS:   Please see the history of present illness.    All other systems reviewed and are negative.  EKGs/Labs/Other Studies Reviewed:    The following studies were reviewed today:  PFO closure 03/06/22:  1.  Successful PFO closure with 30 mm Gore Cardioform device.   Recommendation: Dual antiplatelet for 6 months and then aspirin monotherapy indefinitely; antibiotic prophylaxis for any dental work or dental cleanings for 6 months.   EKG:  EKG is not ordered today.    Recent Labs: 02/20/2022: ALT 22; BUN 12; Creatinine 1.05; Hemoglobin 13.8; Platelet Count 225; Potassium 4.2; Sodium 139   Recent Lipid Panel    Component Value Date/Time   CHOL 132 12/02/2021 0423   CHOL 171 12/18/2020 0817   TRIG 92 12/02/2021 0423   HDL 37 (L) 12/02/2021 0423   HDL 42 12/18/2020 0817   CHOLHDL 3.6 12/02/2021 0423   VLDL 18 12/02/2021 0423    LDLCALC 77 12/02/2021 0423   LDLCALC 110 (H) 12/18/2020 0817   Physical Exam:    VS:  BP 110/78   Pulse 79   Ht '5\' 11"'$  (1.803 m)   Wt 269 lb 9.6 oz (122.3 kg)   SpO2 98%   BMI 37.60 kg/m     Wt Readings from Last 3 Encounters:  04/06/22 269 lb 9.6 oz (122.3 kg)  03/06/22 272 lb (123.4 kg)  02/20/22 269 lb (122 kg)    General: Well developed, well nourished, NAD Lungs:Clear to ausculation bilaterally. No wheezes, rales, or rhonchi. Breathing is unlabored. Cardiovascular: RRR with S1 S2. No murmurs Extremities: No edema.  Neuro: Alert and oriented. No focal deficits. No facial asymmetry. MAE spontaneously. Psych: Responds to questions appropriately with normal affect.    ASSESSMENT/PLAN:    PFO: s/p PFO closure 03/06/22 with 48m Gore Cardioform device. Plan for DAPT with ASA and Plavix x 6 months then ASA monotherapy indefinitely. He will require dental SBE for 6 months. I have sent Amoxicillin to his pharmacy. Plan one year follow up with limited echo with bubble.   Palpitations: Prior monitor with no AF and 3 episodes of NSVT. He was started on beta blocker. Although improved, continues to have intermittent palpitations with no associated symptoms. HR today at 50bpm. Currently wearing ZIO XT therefore unable to review in [place]. Will wait for monitor results. May need increase in beta blocker therapy.   Stroke: No residual noted. Continues to follow with neurology. Continue ASA, Plavix and statin.    Medication Adjustments/Labs and Tests Ordered: Current medicines are reviewed at length with the patient today.  Concerns regarding medicines are outlined above.  No orders of the defined types were placed in this encounter.  Meds ordered this encounter  Medications   clopidogrel (PLAVIX) 75 MG tablet    Sig: Take 1 tablet (75 mg total)  by mouth daily.    Dispense:  30 tablet    Refill:  11    Patient Instructions  Medication Instructions:  Your physician  recommends that you continue on your current medications as directed. Please refer to the Current Medication list given to you today.  *If you need a refill on your cardiac medications before your next appointment, please call your pharmacy*   Lab Work: NONE If you have labs (blood work) drawn today and your tests are completely normal, you will receive your results only by: [place] (if you have MyChart) OR A paper copy in the mail If you have any lab test that is abnormal or we need to change your treatment, we will call you to review the results.   Testing/Procedures: NONE   Follow-Up: At [facility], you and your health needs are our priority.  As part of our continuing mission to provide you with exceptional heart care, we have created designated Provider Care Teams.  These Care Teams include your primary Cardiologist (physician) and Advanced Practice Providers (APPs -  Physician Assistants and Nurse Practitioners) who all work together to provide you with the care you need, when you need it.  We recommend signing up for the patient portal called "MyChart".  Sign up information is provided on this After Visit Summary.  MyChart is used to connect with patients for Virtual Visits (Telemedicine).  Patients are able to view lab/test results, encounter notes, upcoming appointments, etc.  Non-urgent messages can be sent to your provider as well.   To learn more about what you can do with MyChart, go to [email or web address].    Your next appointment:   KEEP SCHEDULED FOLLOW-UP   Signed, Kathyrn Drown, NP  04/06/2022 10:12 AM    [place]

## 2022-04-06 ENCOUNTER — Ambulatory Visit: Payer: BC Managed Care – PPO | Attending: Internal Medicine | Admitting: Cardiology

## 2022-04-06 VITALS — BP 110/78 | HR 79 | Ht 71.0 in | Wt 269.6 lb

## 2022-04-06 DIAGNOSIS — R002 Palpitations: Secondary | ICD-10-CM | POA: Diagnosis not present

## 2022-04-06 DIAGNOSIS — Q2112 Patent foramen ovale: Secondary | ICD-10-CM | POA: Diagnosis not present

## 2022-04-06 DIAGNOSIS — I63432 Cerebral infarction due to embolism of left posterior cerebral artery: Secondary | ICD-10-CM | POA: Diagnosis not present

## 2022-04-06 DIAGNOSIS — I253 Aneurysm of heart: Secondary | ICD-10-CM

## 2022-04-06 DIAGNOSIS — I10 Essential (primary) hypertension: Secondary | ICD-10-CM

## 2022-04-06 MED ORDER — AMOXICILLIN 500 MG PO CAPS: 2000.0000 mg | ORAL_CAPSULE | ORAL | 12 refills | Status: DC

## 2022-04-06 MED ORDER — CLOPIDOGREL BISULFATE 75 MG PO TABS: 75.0000 mg | ORAL_TABLET | Freq: Every day | ORAL | 11 refills | Status: DC

## 2022-04-06 NOTE — Patient Instructions (Signed)
Medication Instructions:  Your physician recommends that you continue on your current medications as directed. Please refer to the Current Medication list given to you today.  *If you need a refill on your cardiac medications before your next appointment, please call your pharmacy*   Lab Work: NONE If you have labs (blood work) drawn today and your tests are completely normal, you will receive your results only by: [place] (if you have MyChart) OR A paper copy in the mail If you have any lab test that is abnormal or we need to change your treatment, we will call you to review the results.   Testing/Procedures: NONE   Follow-Up: At [facility], you and your health needs are our priority.  As part of our continuing mission to provide you with exceptional heart care, we have created designated Provider Care Teams.  These Care Teams include your primary Cardiologist (physician) and Advanced Practice Providers (APPs -  Physician Assistants and Nurse Practitioners) who all work together to provide you with the care you need, when you need it.  We recommend signing up for the patient portal called "MyChart".  Sign up information is provided on this After Visit Summary.  MyChart is used to connect with patients for Virtual Visits (Telemedicine).  Patients are able to view lab/test results, encounter notes, upcoming appointments, etc.  Non-urgent messages can be sent to your provider as well.   To learn more about what you can do with MyChart, go to [email or web address].    Your next appointment:   KEEP SCHEDULED FOLLOW-UP

## 2022-04-10 ENCOUNTER — Other Ambulatory Visit: Payer: Self-pay | Admitting: Nurse Practitioner

## 2022-04-10 ENCOUNTER — Ambulatory Visit: Payer: BC Managed Care – PPO

## 2022-04-20 ENCOUNTER — Inpatient Hospital Stay: Payer: BC Managed Care – PPO

## 2022-04-20 ENCOUNTER — Inpatient Hospital Stay: Payer: BC Managed Care – PPO | Attending: Hematology & Oncology

## 2022-04-20 ENCOUNTER — Inpatient Hospital Stay (HOSPITAL_BASED_OUTPATIENT_CLINIC_OR_DEPARTMENT_OTHER): Payer: BC Managed Care – PPO | Admitting: Medical Oncology

## 2022-04-20 VITALS — BP 120/76 | HR 82 | Temp 98.4°F | Resp 17 | Ht 71.0 in | Wt 269.8 lb

## 2022-04-20 VITALS — BP 115/71 | HR 68 | Temp 98.3°F | Resp 18

## 2022-04-20 DIAGNOSIS — F410 Panic disorder [episodic paroxysmal anxiety] without agoraphobia: Secondary | ICD-10-CM | POA: Diagnosis not present

## 2022-04-20 DIAGNOSIS — Z79899 Other long term (current) drug therapy: Secondary | ICD-10-CM | POA: Diagnosis not present

## 2022-04-20 DIAGNOSIS — Z7982 Long term (current) use of aspirin: Secondary | ICD-10-CM | POA: Diagnosis not present

## 2022-04-20 DIAGNOSIS — C8223 Follicular lymphoma grade III, unspecified, intra-abdominal lymph nodes: Secondary | ICD-10-CM

## 2022-04-20 DIAGNOSIS — Z7901 Long term (current) use of anticoagulants: Secondary | ICD-10-CM | POA: Insufficient documentation

## 2022-04-20 DIAGNOSIS — Z5112 Encounter for antineoplastic immunotherapy: Secondary | ICD-10-CM | POA: Insufficient documentation

## 2022-04-20 LAB — CBC WITH DIFFERENTIAL (CANCER CENTER ONLY)
Abs Immature Granulocytes: 0.02 10*3/uL (ref 0.00–0.07)
Basophils Absolute: 0 10*3/uL (ref 0.0–0.1)
Basophils Relative: 1 %
Eosinophils Absolute: 0.2 10*3/uL (ref 0.0–0.5)
Eosinophils Relative: 2 %
HCT: 41.5 % (ref 39.0–52.0)
Hemoglobin: 13.9 g/dL (ref 13.0–17.0)
Immature Granulocytes: 0 %
Lymphocytes Relative: 13 %
Lymphs Abs: 0.8 10*3/uL (ref 0.7–4.0)
MCH: 30.8 pg (ref 26.0–34.0)
MCHC: 33.5 g/dL (ref 30.0–36.0)
MCV: 92 fL (ref 80.0–100.0)
Monocytes Absolute: 0.6 10*3/uL (ref 0.1–1.0)
Monocytes Relative: 10 %
Neutro Abs: 4.6 10*3/uL (ref 1.7–7.7)
Neutrophils Relative %: 74 %
Platelet Count: 239 10*3/uL (ref 150–400)
RBC: 4.51 MIL/uL (ref 4.22–5.81)
RDW: 12.6 % (ref 11.5–15.5)
WBC Count: 6.2 10*3/uL (ref 4.0–10.5)
nRBC: 0 % (ref 0.0–0.2)

## 2022-04-20 LAB — CMP (CANCER CENTER ONLY)
ALT: 13 U/L (ref 0–44)
AST: 13 U/L — ABNORMAL LOW (ref 15–41)
Albumin: 4.4 g/dL (ref 3.5–5.0)
Alkaline Phosphatase: 113 U/L (ref 38–126)
Anion gap: 8 (ref 5–15)
BUN: 15 mg/dL (ref 6–20)
CO2: 28 mmol/L (ref 22–32)
Calcium: 9.3 mg/dL (ref 8.9–10.3)
Chloride: 103 mmol/L (ref 98–111)
Creatinine: 1.08 mg/dL (ref 0.61–1.24)
GFR, Estimated: >60 mL/min (ref >60)
Glucose, Bld: 178 mg/dL — ABNORMAL HIGH (ref 70–99)
Potassium: 4.1 mmol/L (ref 3.5–5.1)
Sodium: 139 mmol/L (ref 135–145)
Total Bilirubin: 0.5 mg/dL (ref 0.3–1.2)
Total Protein: 6.8 g/dL (ref 6.5–8.1)

## 2022-04-20 LAB — LACTATE DEHYDROGENASE: LDH: 144 U/L (ref 98–192)

## 2022-04-20 MED ORDER — SODIUM CHLORIDE 0.9 % IV SOLN: Freq: Once | INTRAVENOUS | Status: AC

## 2022-04-20 MED ORDER — HEPARIN SOD (PORK) LOCK FLUSH 100 UNIT/ML IV SOLN
500.0000 [IU] | Freq: Once | INTRAVENOUS | Status: AC | PRN
  Administered 2022-04-20: 500 [IU]

## 2022-04-20 MED ORDER — SODIUM CHLORIDE 0.9 % IV SOLN
1000.0000 mg | Freq: Once | INTRAVENOUS | Status: AC
  Administered 2022-04-20: 1000 mg via INTRAVENOUS
  Filled 2022-04-20: qty 40

## 2022-04-20 MED ORDER — ACETAMINOPHEN 325 MG PO TABS
650.0000 mg | ORAL_TABLET | Freq: Once | ORAL | Status: AC
  Administered 2022-04-20: 650 mg via ORAL
  Filled 2022-04-20: qty 2

## 2022-04-20 MED ORDER — SODIUM CHLORIDE 0.9% FLUSH
10.0000 mL | INTRAVENOUS | Status: DC | PRN
  Administered 2022-04-20: 10 mL

## 2022-04-20 MED ORDER — FAMOTIDINE IN NACL 20-0.9 MG/50ML-% IV SOLN
20.0000 mg | INTRAVENOUS | Status: AC
  Administered 2022-04-20: 20 mg via INTRAVENOUS
  Filled 2022-04-20: qty 50

## 2022-04-20 MED ORDER — DIPHENHYDRAMINE HCL 50 MG/ML IJ SOLN
12.5000 mg | Freq: Once | INTRAMUSCULAR | Status: AC
  Administered 2022-04-20: 12.5 mg via INTRAVENOUS
  Filled 2022-04-20: qty 1

## 2022-04-20 NOTE — Patient Instructions (Signed)
[facility]  Discharge Instructions: Thank you for choosing [facility] to provide your oncology and hematology care.   If you have a lab appointment with the Cancer Center, please go directly to the Cancer Center and check in at the registration area.  Wear comfortable clothing and clothing appropriate for easy access to any Portacath or PICC line.   We strive to give you quality time with your provider. You may need to reschedule your appointment if you arrive late (15 or more minutes).  Arriving late affects you and other patients whose appointments are after yours.  Also, if you miss three or more appointments without notifying the office, you may be dismissed from the clinic at the provider's discretion.      For prescription refill requests, have your pharmacy contact our office and allow 72 hours for refills to be completed.    Today you received the following chemotherapy and/or immunotherapy agents Gazyva.      To help prevent nausea and vomiting after your treatment, we encourage you to take your nausea medication as directed.  BELOW ARE SYMPTOMS THAT SHOULD BE REPORTED IMMEDIATELY: *FEVER GREATER THAN 100.4 F (38 C) OR HIGHER *CHILLS OR SWEATING *NAUSEA AND VOMITING THAT IS NOT CONTROLLED WITH YOUR NAUSEA MEDICATION *UNUSUAL SHORTNESS OF BREATH *UNUSUAL BRUISING OR BLEEDING *URINARY PROBLEMS (pain or burning when urinating, or frequent urination) *BOWEL PROBLEMS (unusual diarrhea, constipation, pain near the anus) TENDERNESS IN MOUTH AND THROAT WITH OR WITHOUT PRESENCE OF ULCERS (sore throat, sores in mouth, or a toothache) UNUSUAL RASH, SWELLING OR PAIN  UNUSUAL VAGINAL DISCHARGE OR ITCHING   Items with * indicate a potential emergency and should be followed up as soon as possible or go to the Emergency Department if any problems should occur.  Please show the CHEMOTHERAPY ALERT CARD or IMMUNOTHERAPY ALERT CARD at check-in  to the Emergency Department and triage nurse. Should you have questions after your visit or need to cancel or reschedule your appointment, please contact [facility]  [phone] and follow the prompts.  Office hours are 8:00 a.m. to 4:30 p.m. Monday - Friday. Please note that voicemails left after 4:00 p.m. may not be returned until the following business day.  We are closed weekends and major holidays. You have access to a nurse at all times for urgent questions. Please call the main number to the clinic [phone] and follow the prompts.  For any non-urgent questions, you may also contact your provider using MyChart. We now offer e-Visits for anyone 18 and older to request care online for non-urgent symptoms. For details visit [email or web address].   Also download the MyChart app! Go to the app store, search "MyChart", open the app, select [place], and log in with your MyChart username and password.   

## 2022-04-20 NOTE — Progress Notes (Signed)
Hematology and Oncology Follow Up Visit  James Jennings [id number] [birth year] 52 y.o. 04/20/2022   Principle Diagnosis:  Follicular B- cell non-Hodgkin's lymphoma - Relapsed   Past Therapy:             Rituxan/bendamustine-s/p cycle 6 - completed on 07/03/2016   Current Therapy:  Gazyva  - Cytoxan/ Vincristine/Prednisone  - started 11/07/2019, s/p cycle #4 --  D/c on 03/26/2020 G-CHOP -- s/p cycle #6-- started on 04/03/2020 -completed on 07/13/2020 Maintenance Gazyva-s/p cycle 10/12 --  start on 09/05/2020    Interim History:  James Jennings is here today for follow-up.  Since his last visit he has had a closure of a PFO- other than increased panic attacks and anxiety he has been doing well after this. He asks for a referral to a therapist to help him with his panic attacks. He is currently working with his PCP and is on lexapro and PRN xanax which are helping.   He is on anticoagulation. He reports no bleeding or excessive bruising episodes.   He has had no fever, new lumps, bumps, night sweats or unintentional weight loss.  Continue to work on improved glycemic control.  There is no change in bowel or bladder habits.    He has had little bit of a rash.  I think he may have some eczema.  Currently, I would say that his performance status is probably ECOG 0.     Medications:  Allergies as of 04/20/2022       Reactions   Mango Flavor Swelling, Other (See Comments)   LIPS SWELL   Rituximab Other (See Comments)   Blood pressure went extremely low.    Shellfish Allergy Anaphylaxis   Lactose Intolerance (gi) Diarrhea        Medication List        Accurate as of April 20, 2022 10:35 AM. If you have any questions, ask your nurse or doctor.          acetaminophen 325 MG tablet Commonly known as: TYLENOL Take 650 mg by mouth daily as needed for moderate pain or headache.   ALPRAZolam 0.25 MG tablet Commonly known as: XANAX Take 0.25 mg by mouth 2 (two) times daily as needed  for anxiety.   amoxicillin 500 MG capsule Commonly known as: AMOXIL Take 4 capsules (2,000 mg total) by mouth as directed. Take 4 tablets 1 hour prior to dental work, including cleanings.   aspirin EC 81 MG tablet Take 1 tablet (81 mg total) by mouth daily. Swallow whole.   cetirizine 10 MG tablet Commonly known as: ZYRTEC Take 10 mg by mouth daily.   clopidogrel 75 MG tablet Commonly known as: PLAVIX Take 1 tablet (75 mg total) by mouth daily.   escitalopram 5 MG tablet Commonly known as: LEXAPRO Take 5 mg by mouth daily.   Farxiga 10 MG Tabs tablet Generic drug: dapagliflozin propanediol Take 10 mg by mouth daily.   lidocaine-prilocaine cream Commonly known as: EMLA Apply 1 application topically as needed. Apply to [facility] 1 hour prior to procedure.   metFORMIN 500 MG tablet Commonly known as: GLUCOPHAGE Take 1 tablet (500 mg total) by mouth daily.   metoprolol succinate 25 MG 24 hr tablet Commonly known as: TOPROL-XL TAKE 1 TABLET BY MOUTH DAILY. CAN TAKE ADDITIONAL HALF TABLET IF HEART RATE IS OVER 100   multivitamin Tabs tablet Take 1 tablet by mouth daily.   polycarbophil 625 MG tablet Commonly known as: FIBERCON Take 625 mg by mouth daily.  rosuvastatin 40 MG tablet Commonly known as: CRESTOR Take 1 tablet (40 mg total) by mouth daily.        Allergies:  Allergies  Allergen Reactions   Mango Flavor Swelling and Other (See Comments)    LIPS SWELL   Rituximab Other (See Comments)    Blood pressure went extremely low.    Shellfish Allergy Anaphylaxis   Lactose Intolerance (Gi) Diarrhea    Past Medical History, Surgical history, Social history, and Family History were reviewed and updated.  Review of Systems: Review of Systems  Constitutional: Negative.   HENT: Negative.    Eyes: Negative.   Respiratory: Negative.    Cardiovascular: Negative.   Gastrointestinal: Negative.   Genitourinary: Negative.   Musculoskeletal: Negative.   Skin:  Negative.   Neurological: Negative.   Endo/Heme/Allergies: Negative.   Psychiatric/Behavioral: Negative.       Physical Exam:  height is 5\' 11"  (1.803 m) and weight is 269 lb 12.8 oz (122.4 kg). His oral temperature is 98.4 F (36.9 C). His blood pressure is 120/76 and his pulse is 82. His respiration is 17 and oxygen saturation is 97%.   Wt Readings from Last 3 Encounters:  04/20/22 269 lb 12.8 oz (122.4 kg)  04/06/22 269 lb 9.6 oz (122.3 kg)  03/06/22 272 lb (123.4 kg)  His vital signs are temperature of 98.3.  Pulse 86.  Blood pressure 109/76.  Weight is 274 pounds.  Physical Exam Vitals reviewed.  HENT:     Head: Normocephalic and atraumatic.  Eyes:     Pupils: Pupils are equal, round, and reactive to light.  Cardiovascular:     Rate and Rhythm: Normal rate and regular rhythm.     Heart sounds: Normal heart sounds.  Pulmonary:     Effort: Pulmonary effort is normal.     Breath sounds: Normal breath sounds.  Abdominal:     General: Bowel sounds are normal.     Palpations: Abdomen is soft.  Musculoskeletal:        General: No tenderness or deformity. Normal range of motion.     Cervical back: Normal range of motion.  Lymphadenopathy:     Cervical: No cervical adenopathy.  Skin:    General: Skin is warm and dry.     Findings: No erythema or rash.  Neurological:     Mental Status: He is alert and oriented to person, place, and time.  Psychiatric:        Behavior: Behavior normal.        Thought Content: Thought content normal.        Judgment: Judgment normal.      Lab Results  Component Value Date   WBC 6.2 04/20/2022   HGB 13.9 04/20/2022   HCT 41.5 04/20/2022   MCV 92.0 04/20/2022   PLT 239 04/20/2022   No results found for: "FERRITIN", "IRON", "TIBC", "UIBC", "IRONPCTSAT" Lab Results  Component Value Date   RETICCTPCT 1.07 12/06/2015   RBC 4.51 04/20/2022   RETICCTABS 48.79 12/06/2015   No results found for: "KPAFRELGTCHN", "LAMBDASER",  "KAPLAMBRATIO" Lab Results  Component Value Date   IGGSERUM 585 (L) 12/19/2021   IGA 133 12/19/2021   IGMSERUM 12 (L) 12/19/2021   Lab Results  Component Value Date   TOTALPROTELP 6.1 12/25/2020   ALBUMINELP 3.7 12/25/2020   A1GS 0.2 12/25/2020   A2GS 0.7 12/25/2020   BETS 1.0 12/25/2020   GAMS 0.5 12/25/2020   MSPIKE Not Observed 12/25/2020     Chemistry  Component Value Date/Time   NA 139 02/20/2022 0918   NA 143 02/04/2022 1506   NA 141 12/03/2016 1155   NA 141 02/07/2016 1149   K 4.2 02/20/2022 0918   K 3.8 12/03/2016 1155   K 4.3 02/07/2016 1149   CL 103 02/20/2022 0918   CL 100 12/03/2016 1155   CO2 28 02/20/2022 0918   CO2 27 12/03/2016 1155   CO2 27 02/07/2016 1149   BUN 12 02/20/2022 0918   BUN 11 02/04/2022 1506   BUN 10 12/03/2016 1155   BUN 10.9 02/07/2016 1149   CREATININE 1.05 02/20/2022 0918   CREATININE 1.0 12/03/2016 1155   CREATININE 0.9 02/07/2016 1149      Component Value Date/Time   CALCIUM 9.8 02/20/2022 0918   CALCIUM 9.1 12/03/2016 1155   CALCIUM 9.6 02/07/2016 1149   ALKPHOS 131 (H) 02/20/2022 0918   ALKPHOS 99 (H) 12/03/2016 1155   ALKPHOS 127 02/07/2016 1149   AST 20 02/20/2022 0918   AST 17 02/07/2016 1149   ALT 22 02/20/2022 0918   ALT 24 12/03/2016 1155   ALT 16 02/07/2016 1149   BILITOT 0.5 02/20/2022 0918   BILITOT 0.42 02/07/2016 1149       Impression and Plan: James Jennings is a pleasant 52 yo caucasian gentleman with history of follicular large cell non-Hodgkin's lymphoma. He completed 6 cycles of chemotherapy with bendamustine on 07/03/2016 but was not tolerant of Rituxan (anaphylaxis). His lymphoma has  recurred.  We then treated him with CHOP.  This got him into remission.  He is on maintenance Gazyva now.  We will go ahead with his 11th cycle of treatment today.  He will have a total of 12 cycles.  He is getting this treatment every 2 months.   I will also place a referral to social work to help with care coordination  regarding his anxiety and panic attacks. We also discussed grounding techniques that may offer him assistance while he waits to be seen by therapy: Pinwheel, balloon, room color search, box breathing, cold therapy, etc.   Disposition: Today Cycle 11 of maintenance Gazyva RTC 2 months MD, labs, [place], [place] 3/18/202410:35 AM

## 2022-04-20 NOTE — Patient Instructions (Signed)

## 2022-04-21 ENCOUNTER — Ambulatory Visit: Payer: BC Managed Care – PPO | Admitting: Hematology & Oncology

## 2022-04-21 ENCOUNTER — Other Ambulatory Visit: Payer: BC Managed Care – PPO

## 2022-04-21 ENCOUNTER — Ambulatory Visit: Payer: BC Managed Care – PPO

## 2022-04-24 ENCOUNTER — Other Ambulatory Visit: Payer: Self-pay | Admitting: Hematology and Oncology

## 2022-04-24 ENCOUNTER — Encounter: Payer: Self-pay | Admitting: Hematology & Oncology

## 2022-04-24 NOTE — Progress Notes (Signed)
OFF PATHWAY REGIMEN - Lymphoma and CLL  No Change  Continue With Treatment as Ordered.  Original Decision Date/Time: 03/26/2020 14:10   [id number] q21 days:   A cycle is every 21 days:     Cyclophosphamide      Doxorubicin      Vincristine      Prednisone   **Always confirm dose/schedule in your pharmacy ordering system**  Patient Characteristics: Diffuse Large B-Cell Lymphoma or Follicular Lymphoma, Grade 3B, Relapsed / Refractory, All Stages,  Second Line, Transplant Candidate Disease Type: Follicular Lymphoma, Grade 3B Disease Type: Not Applicable Disease Type: Not Applicable Line of therapy: Relapsed / Refractory - Second Line Patient Characteristics: Transplant Candidate Intent of Therapy: Curative Intent, Discussed with Patient

## 2022-05-01 ENCOUNTER — Other Ambulatory Visit: Payer: Self-pay | Admitting: Hematology & Oncology

## 2022-05-01 DIAGNOSIS — C8223 Follicular lymphoma grade III, unspecified, intra-abdominal lymph nodes: Secondary | ICD-10-CM

## 2022-05-07 ENCOUNTER — Telehealth: Payer: Self-pay | Admitting: Internal Medicine

## 2022-05-07 NOTE — Telephone Encounter (Signed)
Called Raquel Sarna from [employer] and left voicemail message to call the clinic.

## 2022-05-07 NOTE — Telephone Encounter (Signed)
Office would like a callback regarding pt's procedure done on 2/2 by provider and would like to know if pt's defect was Ostium Secednum?. Please advise.

## 2022-05-08 NOTE — Telephone Encounter (Signed)
Irving Burton with [employer] is returning she. She mentions that additional information is needed for authorization. She requested a detailed voice message if she is unavailable when her call is returned.  Phone#: [phone] (ext#: [phone])

## 2022-05-08 NOTE — Telephone Encounter (Signed)
I called the [employer] number back, entered extension.  Phone rang but no VM picked up.

## 2022-05-12 NOTE — Telephone Encounter (Signed)
Called and spoke with Irving Burton at [employer]. She states she has reviewed notes from PFO closure procedure performed on 03/06/22 and just needs to know if patient's ASD was ostium secundum.   Will forward to Dr. Lynnette Caffey and his nurse to review and follow-up.  Best contact number for Irving Burton is [phone], ext [phone]. She states she has a Dance movement psychotherapist and a detailed message can be left if she does not answer call.

## 2022-05-12 NOTE — Telephone Encounter (Signed)
Called to say call Irving Burton at this #[phone]. Please advise

## 2022-05-12 NOTE — Telephone Encounter (Signed)
Left detailed message for Irving Burton, RN with [employer] with Dr. Trula Ore response:  Patent foramen ovale (PFO) not a secundum ASD   Provided office number for callback if any questions.

## 2022-05-18 ENCOUNTER — Encounter: Payer: Self-pay | Admitting: Internal Medicine

## 2022-06-03 ENCOUNTER — Telehealth: Payer: Self-pay | Admitting: *Deleted

## 2022-06-03 ENCOUNTER — Observation Stay (HOSPITAL_COMMUNITY)
Admission: EM | Admit: 2022-06-03 | Discharge: 2022-06-04 | Disposition: A | Discharge status: Home / Self Care | Payer: BC Managed Care – PPO | Attending: Internal Medicine | Admitting: Internal Medicine

## 2022-06-03 ENCOUNTER — Other Ambulatory Visit: Payer: Self-pay

## 2022-06-03 ENCOUNTER — Emergency Department (HOSPITAL_COMMUNITY): Payer: BC Managed Care – PPO

## 2022-06-03 ENCOUNTER — Encounter (HOSPITAL_COMMUNITY): Payer: Self-pay

## 2022-06-03 DIAGNOSIS — A419 Sepsis, unspecified organism: Secondary | ICD-10-CM | POA: Diagnosis not present

## 2022-06-03 DIAGNOSIS — R000 Tachycardia, unspecified: Secondary | ICD-10-CM | POA: Diagnosis not present

## 2022-06-03 DIAGNOSIS — Z7984 Long term (current) use of oral hypoglycemic drugs: Secondary | ICD-10-CM | POA: Diagnosis not present

## 2022-06-03 DIAGNOSIS — E876 Hypokalemia: Secondary | ICD-10-CM | POA: Insufficient documentation

## 2022-06-03 DIAGNOSIS — Z7982 Long term (current) use of aspirin: Secondary | ICD-10-CM | POA: Diagnosis not present

## 2022-06-03 DIAGNOSIS — Z79899 Other long term (current) drug therapy: Secondary | ICD-10-CM | POA: Diagnosis not present

## 2022-06-03 DIAGNOSIS — I10 Essential (primary) hypertension: Secondary | ICD-10-CM | POA: Diagnosis not present

## 2022-06-03 DIAGNOSIS — Z7902 Long term (current) use of antithrombotics/antiplatelets: Secondary | ICD-10-CM | POA: Insufficient documentation

## 2022-06-03 DIAGNOSIS — J168 Pneumonia due to other specified infectious organisms: Secondary | ICD-10-CM | POA: Diagnosis not present

## 2022-06-03 DIAGNOSIS — Z87891 Personal history of nicotine dependence: Secondary | ICD-10-CM | POA: Diagnosis not present

## 2022-06-03 DIAGNOSIS — J189 Pneumonia, unspecified organism: Secondary | ICD-10-CM | POA: Diagnosis not present

## 2022-06-03 DIAGNOSIS — N179 Acute kidney failure, unspecified: Secondary | ICD-10-CM | POA: Diagnosis not present

## 2022-06-03 DIAGNOSIS — E119 Type 2 diabetes mellitus without complications: Secondary | ICD-10-CM | POA: Diagnosis not present

## 2022-06-03 DIAGNOSIS — Z1152 Encounter for screening for COVID-19: Secondary | ICD-10-CM | POA: Insufficient documentation

## 2022-06-03 DIAGNOSIS — Z720 Tobacco use: Secondary | ICD-10-CM

## 2022-06-03 DIAGNOSIS — I639 Cerebral infarction, unspecified: Secondary | ICD-10-CM | POA: Diagnosis present

## 2022-06-03 DIAGNOSIS — Z794 Long term (current) use of insulin: Secondary | ICD-10-CM | POA: Insufficient documentation

## 2022-06-03 DIAGNOSIS — E871 Hypo-osmolality and hyponatremia: Secondary | ICD-10-CM | POA: Diagnosis not present

## 2022-06-03 DIAGNOSIS — R059 Cough, unspecified: Secondary | ICD-10-CM | POA: Diagnosis not present

## 2022-06-03 DIAGNOSIS — C8223 Follicular lymphoma grade III, unspecified, intra-abdominal lymph nodes: Secondary | ICD-10-CM | POA: Diagnosis present

## 2022-06-03 DIAGNOSIS — R509 Fever, unspecified: Secondary | ICD-10-CM | POA: Diagnosis not present

## 2022-06-03 DIAGNOSIS — R6520 Severe sepsis without septic shock: Secondary | ICD-10-CM | POA: Diagnosis not present

## 2022-06-03 LAB — CBC WITH DIFFERENTIAL/PLATELET
Abs Immature Granulocytes: 0.4 10*3/uL — ABNORMAL HIGH (ref 0.00–0.07)
Basophils Absolute: 0 10*3/uL (ref 0.0–0.1)
Basophils Relative: 0 %
Eosinophils Absolute: 0.6 10*3/uL — ABNORMAL HIGH (ref 0.0–0.5)
Eosinophils Relative: 3 %
HCT: 39.9 % (ref 39.0–52.0)
Hemoglobin: 13.5 g/dL (ref 13.0–17.0)
Immature Granulocytes: 2 %
Lymphocytes Relative: 5 %
Lymphs Abs: 0.8 10*3/uL (ref 0.7–4.0)
MCH: 30.8 pg (ref 26.0–34.0)
MCHC: 33.8 g/dL (ref 30.0–36.0)
MCV: 90.9 fL (ref 80.0–100.0)
Monocytes Absolute: 1.9 10*3/uL — ABNORMAL HIGH (ref 0.1–1.0)
Monocytes Relative: 11 %
Neutro Abs: 13.6 10*3/uL — ABNORMAL HIGH (ref 1.7–7.7)
Neutrophils Relative %: 79 %
Platelets: 164 10*3/uL (ref 150–400)
RBC: 4.39 MIL/uL (ref 4.22–5.81)
RDW: 12.9 % (ref 11.5–15.5)
WBC: 17.4 10*3/uL — ABNORMAL HIGH (ref 4.0–10.5)
nRBC: 0 % (ref 0.0–0.2)

## 2022-06-03 LAB — COMPREHENSIVE METABOLIC PANEL
ALT: 12 U/L (ref 0–44)
AST: 19 U/L (ref 15–41)
Albumin: 3.3 g/dL — ABNORMAL LOW (ref 3.5–5.0)
Alkaline Phosphatase: 81 U/L (ref 38–126)
Anion gap: 12 (ref 5–15)
BUN: 16 mg/dL (ref 6–20)
CO2: 24 mmol/L (ref 22–32)
Calcium: 8.5 mg/dL — ABNORMAL LOW (ref 8.9–10.3)
Chloride: 96 mmol/L — ABNORMAL LOW (ref 98–111)
Creatinine, Ser: 1.47 mg/dL — ABNORMAL HIGH (ref 0.61–1.24)
GFR, Estimated: 57 mL/min — ABNORMAL LOW (ref >60)
Glucose, Bld: 167 mg/dL — ABNORMAL HIGH (ref 70–99)
Potassium: 3.2 mmol/L — ABNORMAL LOW (ref 3.5–5.1)
Sodium: 132 mmol/L — ABNORMAL LOW (ref 135–145)
Total Bilirubin: 1.4 mg/dL — ABNORMAL HIGH (ref 0.3–1.2)
Total Protein: 6.1 g/dL — ABNORMAL LOW (ref 6.5–8.1)

## 2022-06-03 LAB — HEMOGLOBIN A1C
Hgb A1c MFr Bld: 7 % — ABNORMAL HIGH (ref 4.8–5.6)
Mean Plasma Glucose: 154.2 mg/dL

## 2022-06-03 LAB — URINALYSIS, ROUTINE W REFLEX MICROSCOPIC
Bacteria, UA: NONE SEEN
Bilirubin Urine: NEGATIVE
Glucose, UA: >=500 mg/dL — AB
Ketones, ur: NEGATIVE mg/dL
Leukocytes,Ua: NEGATIVE
Nitrite: NEGATIVE
Protein, ur: NEGATIVE mg/dL
Specific Gravity, Urine: 1.02 (ref 1.005–1.030)
pH: 5 (ref 5.0–8.0)

## 2022-06-03 LAB — RESP PANEL BY RT-PCR (RSV, FLU A&B, COVID)  RVPGX2
Influenza A by PCR: NEGATIVE
Influenza B by PCR: NEGATIVE
Resp Syncytial Virus by PCR: NEGATIVE
SARS Coronavirus 2 by RT PCR: NEGATIVE

## 2022-06-03 LAB — GLUCOSE, CAPILLARY
Glucose-Capillary: 110 mg/dL — ABNORMAL HIGH (ref 70–99)
Glucose-Capillary: 123 mg/dL — ABNORMAL HIGH (ref 70–99)

## 2022-06-03 LAB — LACTIC ACID, PLASMA
Lactic Acid, Venous: 2.7 mmol/L (ref 0.5–1.9)
Lactic Acid, Venous: 1.2 mmol/L (ref 0.5–1.9)

## 2022-06-03 LAB — MAGNESIUM: Magnesium: 1.8 mg/dL (ref 1.7–2.4)

## 2022-06-03 LAB — PHOSPHORUS: Phosphorus: 2 mg/dL — ABNORMAL LOW (ref 2.5–4.6)

## 2022-06-03 MED ORDER — ACETAMINOPHEN 325 MG PO TABS
650.0000 mg | ORAL_TABLET | Freq: Every day | ORAL | Status: DC | PRN
  Administered 2022-06-03 – 2022-06-04 (×2): 650 mg via ORAL
  Filled 2022-06-03 (×2): qty 2

## 2022-06-03 MED ORDER — DAPAGLIFLOZIN PROPANEDIOL 10 MG PO TABS: 10.0000 mg | ORAL_TABLET | Freq: Every day | ORAL | Status: DC

## 2022-06-03 MED ORDER — LORATADINE 10 MG PO TABS
10.0000 mg | ORAL_TABLET | Freq: Every day | ORAL | Status: DC
  Administered 2022-06-04: 10 mg via ORAL
  Filled 2022-06-03: qty 1

## 2022-06-03 MED ORDER — INSULIN ASPART 100 UNIT/ML IJ SOLN
0.0000 [IU] | Freq: Three times a day (TID) | INTRAMUSCULAR | Status: DC
  Administered 2022-06-04 (×2): 2 [IU] via SUBCUTANEOUS

## 2022-06-03 MED ORDER — ROSUVASTATIN CALCIUM 20 MG PO TABS
40.0000 mg | ORAL_TABLET | Freq: Every day | ORAL | Status: DC
  Administered 2022-06-04: 40 mg via ORAL
  Filled 2022-06-03: qty 2

## 2022-06-03 MED ORDER — LACTATED RINGERS IV SOLN: INTRAVENOUS | Status: AC

## 2022-06-03 MED ORDER — LACTATED RINGERS IV BOLUS (SEPSIS)
2000.0000 mL | Freq: Once | INTRAVENOUS | Status: AC
  Administered 2022-06-03: 2000 mL via INTRAVENOUS

## 2022-06-03 MED ORDER — ALPRAZOLAM 0.25 MG PO TABS: 0.2500 mg | ORAL_TABLET | Freq: Two times a day (BID) | ORAL | Status: DC | PRN

## 2022-06-03 MED ORDER — ASPIRIN 81 MG PO TBEC
81.0000 mg | DELAYED_RELEASE_TABLET | Freq: Every day | ORAL | Status: DC
  Administered 2022-06-04: 81 mg via ORAL
  Filled 2022-06-03: qty 1

## 2022-06-03 MED ORDER — METRONIDAZOLE 500 MG/100ML IV SOLN
500.0000 mg | Freq: Once | INTRAVENOUS | Status: AC
  Administered 2022-06-03: 500 mg via INTRAVENOUS
  Filled 2022-06-03: qty 100

## 2022-06-03 MED ORDER — ENOXAPARIN SODIUM 40 MG/0.4ML IJ SOSY
40.0000 mg | PREFILLED_SYRINGE | INTRAMUSCULAR | Status: DC
  Administered 2022-06-03: 40 mg via SUBCUTANEOUS
  Filled 2022-06-03: qty 0.4

## 2022-06-03 MED ORDER — METOPROLOL SUCCINATE ER 25 MG PO TB24
25.0000 mg | ORAL_TABLET | Freq: Every day | ORAL | Status: DC
  Administered 2022-06-04: 25 mg via ORAL
  Filled 2022-06-03: qty 1

## 2022-06-03 MED ORDER — SODIUM CHLORIDE 0.9 % IV SOLN: 2.0000 g | Freq: Three times a day (TID) | INTRAVENOUS | Status: DC

## 2022-06-03 MED ORDER — AZITHROMYCIN 250 MG PO TABS
500.0000 mg | ORAL_TABLET | Freq: Every day | ORAL | Status: DC
  Administered 2022-06-03 – 2022-06-04 (×2): 500 mg via ORAL
  Filled 2022-06-03 (×2): qty 2

## 2022-06-03 MED ORDER — VANCOMYCIN HCL 10 G IV SOLR
2500.0000 mg | Freq: Once | INTRAVENOUS | Status: DC
  Administered 2022-06-03: 2500 mg via INTRAVENOUS
  Filled 2022-06-03: qty 2500

## 2022-06-03 MED ORDER — SODIUM CHLORIDE 0.9 % IV SOLN
2.0000 g | Freq: Once | INTRAVENOUS | Status: AC
  Administered 2022-06-03: 2 g via INTRAVENOUS
  Filled 2022-06-03: qty 12.5

## 2022-06-03 MED ORDER — CLOPIDOGREL BISULFATE 75 MG PO TABS
75.0000 mg | ORAL_TABLET | Freq: Every day | ORAL | Status: DC
  Administered 2022-06-04: 75 mg via ORAL
  Filled 2022-06-03: qty 1

## 2022-06-03 MED ORDER — GUAIFENESIN ER 600 MG PO TB12
1200.0000 mg | ORAL_TABLET | Freq: Two times a day (BID) | ORAL | Status: DC
  Administered 2022-06-03 – 2022-06-04 (×2): 1200 mg via ORAL
  Filled 2022-06-03 (×3): qty 2

## 2022-06-03 MED ORDER — BENZONATATE 100 MG PO CAPS: 200.0000 mg | ORAL_CAPSULE | Freq: Three times a day (TID) | ORAL | Status: DC | PRN

## 2022-06-03 MED ORDER — VANCOMYCIN HCL 1500 MG/300ML IV SOLN: 1500.0000 mg | INTRAVENOUS | Status: DC

## 2022-06-03 MED ORDER — POTASSIUM CHLORIDE CRYS ER 20 MEQ PO TBCR
40.0000 meq | EXTENDED_RELEASE_TABLET | Freq: Once | ORAL | Status: AC
  Administered 2022-06-03: 40 meq via ORAL
  Filled 2022-06-03: qty 2

## 2022-06-03 MED ORDER — VANCOMYCIN HCL IN DEXTROSE 1-5 GM/200ML-% IV SOLN: 1000.0000 mg | Freq: Once | INTRAVENOUS | Status: DC

## 2022-06-03 MED ORDER — SODIUM CHLORIDE 0.9 % IV SOLN
2.0000 g | INTRAVENOUS | Status: DC
  Administered 2022-06-03: 2 g via INTRAVENOUS
  Filled 2022-06-03: qty 20

## 2022-06-03 MED ORDER — ESCITALOPRAM OXALATE 10 MG PO TABS
5.0000 mg | ORAL_TABLET | Freq: Every day | ORAL | Status: DC
  Administered 2022-06-04: 5 mg via ORAL
  Filled 2022-06-03: qty 1

## 2022-06-03 NOTE — ED Triage Notes (Signed)
Pt came in via POV d/t 1100 yesterday beginning to have weakness & chills with neck pain, then spiked a fever of 103.5, Tylenol did help with that. Today continued to feel weak, is able to drink fluids, does feel clammy, decreased appetite. BP was low @ 78/52, A/Ox4. Some runny nose & cough is present, denies SOB or n/v/d.

## 2022-06-03 NOTE — ED Notes (Signed)
ED TO INPATIENT HANDOFF REPORT  ED Nurse Name and Phone #:   S Name/Age/Gender James Jennings 52 y.o. male Room/Bed: 001C/001C  Code Status   Code Status: Full Code  Home/SNF/Other Home Patient oriented to: self, place, time, and situation Is this baseline? Yes   Triage Complete: Triage complete  Chief Complaint PNA (pneumonia) [J18.9]  Triage Note Pt came in via POV d/t 1100 yesterday beginning to have weakness & chills with neck pain, then spiked a fever of 103.5, Tylenol did help with that. Today continued to feel weak, is able to drink fluids, does feel clammy, decreased appetite. BP was low @ 78/52, A/Ox4. Some runny nose & cough is present, denies SOB or n/v/d.   Allergies Allergies  Allergen Reactions   Mango Flavor Swelling and Other (See Comments)    LIPS SWELL   Rituximab Other (See Comments)    Blood pressure went extremely low.    Shellfish Allergy Anaphylaxis   Lactose Intolerance (Gi) Diarrhea    Level of Care/Admitting Diagnosis ED Disposition     ED Disposition  Admit   Condition  --   Comment  Hospital Area: [facility] [[id number]]  Level of Care: Telemetry Medical [104]  May place patient in observation at [facility] or [facility] if equivalent level of care is available:: No  Covid Evaluation: Asymptomatic - no recent exposure (last 10 days) testing not required  Diagnosis: PNA (pneumonia) [161096]  Admitting Physician: Emeline General [[id number]]  Attending Physician: Emeline General [[id number]]          B Medical/Surgery History Past Medical History:  Diagnosis Date   Anxiety    Bilateral swelling of feet    Diabetes mellitus without complication ([name])    High cholesterol    Joint pain    Lactose intolerance    Large cell, follicular non-Hodgkin's lymphoma ([name])    Lymphoma, follicular ([name]) dx'd 01/2016   Multiple food allergies    Other fatigue    Palpitations    PONV (postoperative nausea and vomiting)    Pre-diabetes     Prediabetes    Shortness of breath on exertion    Sleep apnea    "dx'd ~ 2008; never RX'd mask" (02/28/2016)   Past Surgical History:  Procedure Laterality Date   ACHILLES TENDON SURGERY Left ~ 2012   APPENDECTOMY  11/12/2015   lap appy   BUBBLE STUDY  12/04/2021   Procedure: BUBBLE STUDY;  Surgeon: Jodelle Red, MD;  Location: [facility] ENDOSCOPY;  Service: Cardiovascular;;   CLUB FOOT RELEASE Bilateral 1971   HERNIA REPAIR     INGUINAL LYMPH NODE BIOPSY Right 01/20/2016   Procedure: EXCISIONAL BIOPSY OF RIGHT INGUINAL LYMPH NODE;  Surgeon: Gaynelle Adu, MD;  Location: [facility] OR;  Service: General;  Laterality: Right;   IR IMAGING GUIDED PORT INSERTION  11/02/2019   LAPAROSCOPIC APPENDECTOMY N/A 11/12/2015   Procedure: APPENDECTOMY LAPAROSCOPIC;  Surgeon: Gaynelle Adu, MD;  Location: [facility] OR;  Service: General;  Laterality: N/A;   LYMPH NODE BIOPSY Left 03/20/2020   Procedure: EXCISIONAL BIOPSY LEFT INGUINAL LYMPH NODE;  Surgeon: Abigail Miyamoto, MD;  Location: [facility] OR;  Service: General;  Laterality: Left;  LMA   PATENT FORAMEN OVALE(PFO) CLOSURE N/A 03/06/2022   Procedure: PATENT FORAMEN OVALE(PFO) CLOSURE;  Surgeon: Orbie Pyo, MD;  Location: [facility] INVASIVE CV LAB;  Service: Cardiovascular;  Laterality: N/A;   SUTURE REMOVAL Left ~ 2016-2017 X 3   "had to use permanent sutures w/my achilles OR; my  body rejects them at times & I have to have them surgically removed; under anesthesia"   TEE WITHOUT CARDIOVERSION N/A 12/04/2021   Procedure: TRANSESOPHAGEAL ECHOCARDIOGRAM (TEE);  Surgeon: Jodelle Red, MD;  Location: [facility] ENDOSCOPY;  Service: Cardiovascular;  Laterality: N/A;   VASECTOMY Bilateral 07/2019   VENTRAL HERNIA REPAIR  1994     A IV Location/Drains/Wounds Patient Lines/Drains/Airways Status     Active Line/Drains/Airways     Name Placement date Placement time Site Days   Implanted Port 11/02/19 Right Chest 11/02/19  0955  Chest  944   Closed System Drain 1  Right;Midline;Lateral Abdomen Bulb (JP) 19 Fr. 11/12/15  2025  Abdomen  2395   Incision - 1 Port Abdomen Right;Superior;Lateral 11/12/15  2007  -- 2395   Incision - 3 Ports Abdomen 1: Umbilicus 2: Lower;Mid 3: Left;Lateral 11/12/15  1735  -- 2395            Intake/Output Last 24 hours No intake or output data in the 24 hours ending 06/03/22 1456  Labs/Imaging Results for orders placed or performed during the hospital encounter of 06/03/22 (from the past 48 hour(s))  Lactic acid, plasma     Status: Abnormal   Collection Time: 06/03/22 11:28 AM  Result Value Ref Range   Lactic Acid, Venous 2.7 (HH) 0.5 - 1.9 mmol/L    Comment: CRITICAL RESULT CALLED TO, READ BACK BY AND VERIFIED WITH MILES,L RN @ 1336 06/03/22 LEONARD,A Performed at [facility], [address], [place], [place] [address]   Comprehensive metabolic panel     Status: Abnormal   Collection Time: 06/03/22 11:28 AM  Result Value Ref Range   Sodium 132 (L) 135 - 145 mmol/L   Potassium 3.2 (L) 3.5 - 5.1 mmol/L   Chloride 96 (L) 98 - 111 mmol/L   CO2 24 22 - 32 mmol/L   Glucose, Bld 167 (H) 70 - 99 mg/dL    Comment: Glucose reference range applies only to samples taken after fasting for at least 8 hours.   BUN 16 6 - 20 mg/dL   Creatinine, Ser 6.04 (H) 0.61 - 1.24 mg/dL   Calcium 8.5 (L) 8.9 - 10.3 mg/dL   Total Protein 6.1 (L) 6.5 - 8.1 g/dL   Albumin 3.3 (L) 3.5 - 5.0 g/dL   AST 19 15 - 41 U/L   ALT 12 0 - 44 U/L   Alkaline Phosphatase 81 38 - 126 U/L   Total Bilirubin 1.4 (H) 0.3 - 1.2 mg/dL   GFR, Estimated 57 (L) >60 mL/min    Comment: (NOTE) Calculated using the CKD-EPI Creatinine Equation (2021)    Anion gap 12 5 - 15    Comment: Performed at [facility], [address], [place], [place] [address]  CBC with Differential     Status: Abnormal   Collection Time: 06/03/22 11:28 AM  Result Value Ref Range   WBC 17.4 (H) 4.0 - 10.5 K/uL   RBC 4.39 4.22 - 5.81 MIL/uL   Hemoglobin 13.5 13.0 - 17.0  g/dL   HCT 11.9 14.7 - 82.9 %   MCV 90.9 80.0 - 100.0 fL   MCH 30.8 26.0 - 34.0 pg   MCHC 33.8 30.0 - 36.0 g/dL   RDW 56.2 13.0 - 86.5 %   Platelets 164 150 - 400 K/uL   nRBC 0.0 0.0 - 0.2 %   Neutrophils Relative % 79 %   Neutro Abs 13.6 (H) 1.7 - 7.7 K/uL   Lymphocytes Relative  5 %   Lymphs Abs 0.8 0.7 - 4.0 K/uL   Monocytes Relative 11 %   Monocytes Absolute 1.9 (H) 0.1 - 1.0 K/uL   Eosinophils Relative 3 %   Eosinophils Absolute 0.6 (H) 0.0 - 0.5 K/uL   Basophils Relative 0 %   Basophils Absolute 0.0 0.0 - 0.1 K/uL   Immature Granulocytes 2 %   Abs Immature Granulocytes 0.40 (H) 0.00 - 0.07 K/uL    Comment: Performed at [facility], [address], [place], [place] [address]  Phosphorus     Status: Abnormal   Collection Time: 06/03/22 11:28 AM  Result Value Ref Range   Phosphorus 2.0 (L) 2.5 - 4.6 mg/dL    Comment: Performed at [facility], [address], [place], [place] [address]  Urinalysis, Routine w reflex microscopic -Urine, Clean Catch     Status: Abnormal   Collection Time: 06/03/22  1:00 PM  Result Value Ref Range   Color, Urine YELLOW YELLOW   APPearance CLEAR CLEAR   Specific Gravity, Urine 1.020 1.005 - 1.030   pH 5.0 5.0 - 8.0   Glucose, UA >=500 (A) NEGATIVE mg/dL   Hgb urine dipstick SMALL (A) NEGATIVE   Bilirubin Urine NEGATIVE NEGATIVE   Ketones, ur NEGATIVE NEGATIVE mg/dL   Protein, ur NEGATIVE NEGATIVE mg/dL   Nitrite NEGATIVE NEGATIVE   Leukocytes,Ua NEGATIVE NEGATIVE   RBC / HPF 0-5 0 - 5 RBC/hpf   WBC, UA 0-5 0 - 5 WBC/hpf   Bacteria, UA NONE SEEN NONE SEEN   Squamous Epithelial / HPF 0-5 0 - 5 /HPF    Comment: Performed at [facility], [address], [place], [place] [address]   DG Chest 2 View  Result Date: 06/03/2022 CLINICAL DATA:  Fever, cough. EXAM: CHEST - 2 VIEW COMPARISON:  Chest radiograph 04/30/2020 FINDINGS: The right chest wall port is stable with tip terminating in the mid SVC. A presumed atrial septal  closure device is noted. The cardiomediastinal silhouette is normal. There is confluent lingular opacity. There is no other focal opacity. There is no pulmonary edema. There is no pleural effusion or pneumothorax There is no acute osseous abnormality. IMPRESSION: Confluent lingular opacity concerning for pneumonia. Recommend follow-up radiographs in 6-8 weeks to assess for resolution. Electronically Signed   By: Lesia Hausen M.D.   On: 06/03/2022 12:09    Pending Labs Unresulted Labs (From admission, onward)     Start     Ordered   06/04/22 0500  Basic metabolic panel  Tomorrow morning,   R        06/03/22 1434   06/04/22 0500  CBC  Tomorrow morning,   R        06/03/22 1434   06/03/22 1435  Magnesium  Add-on,   AD        06/03/22 1434   06/03/22 1429  Mycoplasma pneumoniae antibody, IgM  Once,   URGENT        06/03/22 1428   06/03/22 1429  Legionella Pneumophila Serogp 1 Ur Ag  Once,   URGENT        06/03/22 1428   06/03/22 1128  Resp panel by RT-PCR (RSV, Flu A&B, Covid) Anterior Nasal Swab  (Septic presentation on arrival (screening labs, nursing and treatment orders for obvious sepsis))  Once,   URGENT        06/03/22 1129   06/03/22 1128  Lactic acid, plasma  (Septic presentation on arrival (screening labs, nursing  and treatment orders for obvious sepsis))  Now then every 2 hours,   R (with STAT occurrences)      06/03/22 1129   06/03/22 1128  Blood Culture (routine x 2)  (Septic presentation on arrival (screening labs, nursing and treatment orders for obvious sepsis))  BLOOD CULTURE X 2,   STAT      06/03/22 1129   06/03/22 1128  Urine Culture (for pregnant, neutropenic or urologic patients or patients with an indwelling urinary catheter)  (Septic presentation on arrival (screening labs, nursing and treatment orders for obvious sepsis))  Once,   URGENT       Question:  Indication  Answer:  Sepsis   06/03/22 1129            Vitals/Pain Today's Vitals   06/03/22 1300 06/03/22  1315 06/03/22 1324 06/03/22 1326  BP: 102/62 (!) 101/59 109/63   Pulse: 97 100 92   Resp: 18  15 19   Temp:      TempSrc:      SpO2: 96% 97% 96%   PainSc:        Isolation Precautions No active isolations  Medications Medications  lactated ringers infusion (has no administration in time range)  vancomycin (VANCOCIN) 2,500 mg in sodium chloride 0.9 % 500 mL IVPB (2,500 mg Intravenous New Bag/Given 06/03/22 1401)  vancomycin (VANCOREADY) IVPB 1500 mg/300 mL (has no administration in time range)  cefTRIAXone (ROCEPHIN) 2 g in sodium chloride 0.9 % 100 mL IVPB (has no administration in time range)  azithromycin (ZITHROMAX) tablet 500 mg (has no administration in time range)  acetaminophen (TYLENOL) tablet 650 mg (has no administration in time range)  aspirin EC tablet 81 mg (has no administration in time range)  metoprolol succinate (TOPROL-XL) 24 hr tablet 25 mg (has no administration in time range)  rosuvastatin (CRESTOR) tablet 40 mg (has no administration in time range)  ALPRAZolam (XANAX) tablet 0.25 mg (has no administration in time range)  escitalopram (LEXAPRO) tablet 5 mg (has no administration in time range)  dapagliflozin propanediol (FARXIGA) tablet 10 mg (has no administration in time range)  clopidogrel (PLAVIX) tablet 75 mg (has no administration in time range)  loratadine (CLARITIN) tablet 10 mg (has no administration in time range)  enoxaparin (LOVENOX) injection 40 mg (has no administration in time range)  guaiFENesin (MUCINEX) 12 hr tablet 1,200 mg (has no administration in time range)  benzonatate (TESSALON) capsule 200 mg (has no administration in time range)  potassium chloride SA (KLOR-CON M) CR tablet 40 mEq (has no administration in time range)  ceFEPIme (MAXIPIME) 2 g in sodium chloride 0.9 % 100 mL IVPB (0 g Intravenous Stopped 06/03/22 1243)  metroNIDAZOLE (FLAGYL) IVPB 500 mg (0 mg Intravenous Stopped 06/03/22 1357)  lactated ringers bolus 2,000 mL (2,000 mLs  Intravenous New Bag/Given 06/03/22 1213)    Mobility walks     Focused Assessments    R Recommendations: See Admitting Provider Note  Report given to:   Additional Notes:

## 2022-06-03 NOTE — ED Provider Notes (Signed)
[facility] EMERGENCY DEPARTMENT AT [facility] Provider Note   CSN: [id number] Arrival date & time: 06/03/22  1115     History  Chief Complaint  Patient presents with   Hypotension   Weakness    James Jennings is a 52 y.o. male.  52 year old male with a history of follicular B-cell lymphoma currently undergoing treatment, diabetes, HTN, and PFO status post closure who presents to the emergency department with weakness, chills, and fever.  Patient reports that last night he started feeling sick with chills.  Did have some rigors.  Had a fever of 103.69F has been taking Tylenol for this.  Says that he has had a mild cough and congestion.  No shortness of breath.  Last night started becoming tachycardic and this morning his blood pressure was 78/52.  Does note that he took his morning antihypertensives.  Denies any abdominal pain, nausea, vomiting, or diarrhea or dysuria.  Last treatment for his lymphoma was in March 18 and has another treatment coming up on May 13.       Home Medications Prior to Admission medications   Medication Sig Start Date End Date Taking? Authorizing Provider  acetaminophen (TYLENOL) 325 MG tablet Take 650 mg by mouth daily as needed for moderate pain or headache.   Yes [provider]  ALPRAZolam (XANAX) 0.25 MG tablet Take 0.25 mg by mouth 2 (two) times daily as needed for anxiety. 12/15/21  Yes [provider]  aspirin EC 81 MG tablet Take 1 tablet (81 mg total) by mouth daily. Swallow whole. 12/05/21  Yes Marvel Plan, MD  cetirizine (ZYRTEC) 10 MG tablet Take 10 mg by mouth daily.   Yes [provider]  clopidogrel (PLAVIX) 75 MG tablet Take 1 tablet (75 mg total) by mouth daily. 04/06/22  Yes Georgie Chard D, NP  escitalopram (LEXAPRO) 5 MG tablet Take 5 mg by mouth daily. 03/20/22  Yes [provider]  FARXIGA 10 MG TABS tablet Take 10 mg by mouth daily. 11/20/21  Yes [provider]  metoprolol succinate  (TOPROL-XL) 25 MG 24 hr tablet TAKE 1 TABLET BY MOUTH DAILY. CAN TAKE ADDITIONAL HALF TABLET IF HEART RATE IS OVER 100 Patient taking differently: Take 25 mg by mouth daily. 04/10/22  Yes Orbie Pyo, MD  rosuvastatin (CRESTOR) 40 MG tablet Take 1 tablet (40 mg total) by mouth daily. 02/04/22  Yes Orbie Pyo, MD  amoxicillin (AMOXIL) 500 MG capsule Take 4 capsules (2,000 mg total) by mouth as directed. Take 4 tablets 1 hour prior to dental work, including cleanings. Patient not taking: Reported on 06/03/2022 04/06/22   Georgie Chard D, NP  lidocaine-prilocaine (EMLA) cream Apply 1 application topically as needed. Apply to [facility] 1 hour prior to procedure. Patient not taking: Reported on 06/03/2022 11/08/19   Josph Macho, MD  metFORMIN (GLUCOPHAGE) 500 MG tablet Take 1 tablet (500 mg total) by mouth daily. Patient not taking: Reported on 06/03/2022 06/19/21   Langston Reusing, MD  multivitamin (ONE-A-DAY MEN'S) TABS tablet Take 1 tablet by mouth daily. Patient not taking: Reported on 06/03/2022    [provider]  polycarbophil (FIBERCON) 625 MG tablet Take 625 mg by mouth daily. Patient not taking: Reported on 06/03/2022    [provider]      Allergies    Mango flavor, Rituximab, Shellfish allergy, and Lactose intolerance (gi)    Review of Systems   Review of Systems  Physical Exam Updated Vital Signs BP 109/63  Pulse 92   Temp 100.1 F (37.8 C) (Oral)   Resp 19   SpO2 96%  Physical Exam Vitals and nursing note reviewed.  Constitutional:      General: He is not in acute distress.    Appearance: He is well-developed.  HENT:     Head: Normocephalic and atraumatic.     Right Ear: External ear normal.     Left Ear: External ear normal.     Nose: Congestion present.  Eyes:     Extraocular Movements: Extraocular movements intact.     Conjunctiva/sclera: Conjunctivae normal.     Pupils: Pupils are equal, round, and reactive to light.  Cardiovascular:     Rate  and Rhythm: Regular rhythm. Tachycardia present.     Heart sounds: Normal heart sounds.     Comments: Port site in right chest appears clean dry and intact Pulmonary:     Effort: Pulmonary effort is normal. No respiratory distress.     Breath sounds: Normal breath sounds.  Abdominal:     General: There is no distension.     Palpations: Abdomen is soft. There is no mass.     Tenderness: There is no abdominal tenderness. There is no guarding.  Musculoskeletal:     Cervical back: Normal range of motion and neck supple.     Right lower leg: No edema.     Left lower leg: No edema.  Skin:    General: Skin is warm and dry.  Neurological:     Mental Status: He is alert. Mental status is at baseline.  Psychiatric:        Mood and Affect: Mood normal.        Behavior: Behavior normal.     ED Results / Procedures / Treatments   Labs (all labs ordered are listed, but only abnormal results are displayed) Labs Reviewed  LACTIC ACID, PLASMA - Abnormal; Notable for the following components:      Result Value   Lactic Acid, Venous 2.7 (*)    All other components within normal limits  COMPREHENSIVE METABOLIC PANEL - Abnormal; Notable for the following components:   Sodium 132 (*)    Potassium 3.2 (*)    Chloride 96 (*)    Glucose, Bld 167 (*)    Creatinine, Ser 1.47 (*)    Calcium 8.5 (*)    Total Protein 6.1 (*)    Albumin 3.3 (*)    Total Bilirubin 1.4 (*)    GFR, Estimated 57 (*)    All other components within normal limits  CBC WITH DIFFERENTIAL/PLATELET - Abnormal; Notable for the following components:   WBC 17.4 (*)    Neutro Abs 13.6 (*)    Monocytes Absolute 1.9 (*)    Eosinophils Absolute 0.6 (*)    Abs Immature Granulocytes 0.40 (*)    All other components within normal limits  URINALYSIS, ROUTINE W REFLEX MICROSCOPIC - Abnormal; Notable for the following components:   Glucose, UA >=500 (*)    Hgb urine dipstick SMALL (*)    All other components within normal limits   PHOSPHORUS - Abnormal; Notable for the following components:   Phosphorus 2.0 (*)    All other components within normal limits  RESP PANEL BY RT-PCR (RSV, FLU A&B, COVID)  RVPGX2  CULTURE, BLOOD (ROUTINE X 2)  CULTURE, BLOOD (ROUTINE X 2)  URINE CULTURE  LACTIC ACID, PLASMA  MYCOPLASMA PNEUMONIAE ANTIBODY, IGM  LEGIONELLA PNEUMOPHILA SEROGP 1 UR AG  MAGNESIUM  HEMOGLOBIN A1C  EKG EKG Interpretation  Date/Time:  Wednesday Jun 03 2022 11:24:49 EDT Ventricular Rate:  118 PR Interval:  130 QRS Duration: 83 QT Interval:  320 QTC Calculation: 449 R Axis:   84 Text Interpretation: Sinus tachycardia Borderline T abnormalities, lateral leads Confirmed by Vonita Moss [phone]) on 06/03/2022 12:21:01 PM  Radiology DG Chest 2 View  Result Date: 06/03/2022 CLINICAL DATA:  Fever, cough. EXAM: CHEST - 2 VIEW COMPARISON:  Chest radiograph 04/30/2020 FINDINGS: The right chest wall port is stable with tip terminating in the mid SVC. A presumed atrial septal closure device is noted. The cardiomediastinal silhouette is normal. There is confluent lingular opacity. There is no other focal opacity. There is no pulmonary edema. There is no pleural effusion or pneumothorax There is no acute osseous abnormality. IMPRESSION: Confluent lingular opacity concerning for pneumonia. Recommend follow-up radiographs in 6-8 weeks to assess for resolution. Electronically Signed   By: Lesia Hausen M.D.   On: 06/03/2022 12:09    Procedures Procedures   Medications Ordered in ED Medications  lactated ringers infusion (has no administration in time range)  cefTRIAXone (ROCEPHIN) 2 g in sodium chloride 0.9 % 100 mL IVPB (has no administration in time range)  azithromycin (ZITHROMAX) tablet 500 mg (has no administration in time range)  acetaminophen (TYLENOL) tablet 650 mg (has no administration in time range)  aspirin EC tablet 81 mg (has no administration in time range)  metoprolol succinate (TOPROL-XL) 24 hr  tablet 25 mg (has no administration in time range)  rosuvastatin (CRESTOR) tablet 40 mg (has no administration in time range)  ALPRAZolam (XANAX) tablet 0.25 mg (has no administration in time range)  escitalopram (LEXAPRO) tablet 5 mg (has no administration in time range)  clopidogrel (PLAVIX) tablet 75 mg (has no administration in time range)  loratadine (CLARITIN) tablet 10 mg (has no administration in time range)  enoxaparin (LOVENOX) injection 40 mg (has no administration in time range)  guaiFENesin (MUCINEX) 12 hr tablet 1,200 mg (has no administration in time range)  benzonatate (TESSALON) capsule 200 mg (has no administration in time range)  potassium chloride SA (KLOR-CON M) CR tablet 40 mEq (has no administration in time range)  insulin aspart (novoLOG) injection 0-15 Units (has no administration in time range)  ceFEPIme (MAXIPIME) 2 g in sodium chloride 0.9 % 100 mL IVPB (0 g Intravenous Stopped 06/03/22 1243)  metroNIDAZOLE (FLAGYL) IVPB 500 mg (0 mg Intravenous Stopped 06/03/22 1357)  lactated ringers bolus 2,000 mL (2,000 mLs Intravenous New Bag/Given 06/03/22 1213)    ED Course/ Medical Decision Making/ A&P Clinical Course as of 06/03/22 1541  Wed Jun 03, 2022  1401 Dr Chipper Herb from hospitalist to admit. [RP]    Clinical Course User Index [RP] Rondel Baton, MD                             Medical Decision Making Amount and/or Complexity of Data Reviewed Labs: ordered. Radiology: ordered.  Risk Prescription drug management. Decision regarding hospitalization.   Helton Oleson is a 52 y.o. male with comorbidities that complicate the patient evaluation including for ocular B-cell lymphoma, diabetes, hypertension, and PFO status post close he presents to the emergency department with generalized infectious symptoms and mild cough  Initial Ddx:  Pneumonia, sepsis, URI, UTI, port infection  MDM:  Feel the patient may have a pneumonia given his cough and other symptoms.  Did  have low blood pressure at home so believe he is  septic and could potentially have septic shock so we will check labs including lactate and give him fluids at this time.  Will also check COVID and flu for URI and check for UTI and send blood cultures as well which could be positive his port is infected but feel this is less likely.  Plan:  Labs Lactate Blood cultures Broad-spectrum antibiotics IV fluids Chest x-ray Urinalysis  ED Summary/Re-evaluation:  Patient underwent the above workup and was found to have a pneumonia.  Did have elevated lactate and received 2 L of fluids.  Blood pressures main stable after receiving fluids and patient was admitted to medicine for further management.  Discussed with Dr. Myna Hidalgo the patient's oncologist who will follow along as well.  This patient presents to the ED for concern of complaints listed in HPI, this involves an extensive number of treatment options, and is a complaint that carries with it a high risk of complications and morbidity. Disposition including potential need for admission considered.   Dispo: Admit to Floor  Additional history obtained from spouse Records reviewed Outpatient Clinic Notes The following labs were independently interpreted: CBC and show  elevated white blood cell count concerning for infection I independently reviewed the following imaging with scope of interpretation limited to determining acute life threatening conditions related to emergency care: Chest x-ray and agree with the radiologist interpretation with the following exceptions: none I personally reviewed and interpreted cardiac monitoring: sinus tachycardia I personally reviewed and interpreted the pt's EKG: see above for interpretation  I have reviewed the patients home medications and made adjustments as needed Consults: Hospitalist  Final Clinical Impression(s) / ED Diagnoses Final diagnoses:  Sepsis with acute renal failure without septic shock, due to  unspecified organism, unspecified acute renal failure type ([name])  Pneumonia of left lower lobe due to infectious organism  AKI (acute kidney injury) [facility])    Rx / DC Orders ED Discharge Orders     None         Rondel Baton, MD 06/03/22 1541

## 2022-06-03 NOTE — Progress Notes (Signed)
Pharmacy Antibiotic Note  James Jennings is a 52 y.o. male admitted on 06/03/2022 with sepsis - unknown source.  Pharmacy has been consulted for vancomycin/cefepime dosing. SCr 1.47 on admit (baseline ~1.-1.2). Recent weights stable at ~270lb (most recent from 04/20/22).  Plan: Cefepime 2g IV x 1; then 2g IV q8h Vancomycin 2500mg  IV x 1; then 1500mg  IV q24h. Goal AUC 400-550. Expected AUC: 441 SCr used: 1.47 Flagyl 500mg  IV x 1 per EDP - f/u if to continue Monitor clinical progress, c/s, renal function F/u de-escalation plan/LOT, vancomycin levels as indicated      Temp (24hrs), Avg:100.1 F (37.8 C), Min:100.1 F (37.8 C), Max:100.1 F (37.8 C)  No results for input(s): "WBC", "CREATININE", "LATICACIDVEN", "VANCOTROUGH", "VANCOPEAK", "VANCORANDOM", "GENTTROUGH", "GENTPEAK", "GENTRANDOM", "TOBRATROUGH", "TOBRAPEAK", "TOBRARND", "AMIKACINPEAK", "AMIKACINTROU", "AMIKACIN" in the last 168 hours.  CrCl cannot be calculated (Patient's most recent lab result is older than the maximum 21 days allowed.).    Allergies  Allergen Reactions   Mango Flavor Swelling and Other (See Comments)    LIPS SWELL   Rituximab Other (See Comments)    Blood pressure went extremely low.    Shellfish Allergy Anaphylaxis   Lactose Intolerance (Gi) Diarrhea    Antimicrobials this admission: 5/1 vancomycin >>  5/1 cefepime >>  5/1 flagyl x 1  Dose adjustments this admission:   Microbiology results:   Leia Alf, PharmD, BCPS Please check AMION for all [facility] contact numbers Clinical Pharmacist 06/03/2022 11:32 AM

## 2022-06-03 NOTE — H&P (Addendum)
History and Physical    James Jennings:[id number] DOB: [birth year] DOA: 06/03/2022  PCP: James Fillers, MD (Confirm with patient/family/NH records and if not entered, this has to be entered at [facility] point of entry) Patient coming from: Home  I have personally briefly reviewed patient's old medical records in [facility]  Chief Complaint: Fever, chill, cough  HPI: James Jennings is a 52 y.o. male with medical history significant of non-Hodgkin's lymphoma in remission, HTN, HLD, IIDM, anxiety/depression, presented with fever chills and cough.  Symptoms started 2 to 3 weeks ago with gradually worsening of runny nose and dry cough.  Yesterday morning, patient woke up with feeling of severe chills with malaise generalized weakness and increasing dry cough.  In the afternoon he found that he became very tachycardia by his Apple Watch heart rate in the 120-140s, and spiked fever of 10 1-1 02 between 4 PM and 10 PM he took some Tylenol and went to sleep and woke up this morning with worsening of malaise and generalized weakness.  Poor intake for last 24 hours.  Denies any urinary symptoms no diarrhea no abdominal pain no rash on skin.  He was diagnosed with non-Hodgkin's lymphoma 4 years ago underwent 2 episodes completed in 2022.  Recent PET scan showed no symptoms or signs of lymphoma  ED Course: Low-grade fever 100.1, tachycardia heart rate 110s-120s, nonhypoxic blood pressure under lower side with systolic 80-90s, patient was given total 2 L and heart rate improved to 90-100 and blood pressure improved to 110/60.  X-ray showed left lingular lobe opacity suggesting pneumonia.  Recommend repeat chest x-ray 4-8 weeks.  Review of Systems: As per HPI otherwise 14 point review of systems negative.    Past Medical History:  Diagnosis Date   Anxiety    Bilateral swelling of feet    Diabetes mellitus without complication ([name])    High cholesterol    Joint pain    Lactose intolerance    Large cell,  follicular non-Hodgkin's lymphoma ([name])    Lymphoma, follicular ([name]) dx'd 01/2016   Multiple food allergies    Other fatigue    Palpitations    PONV (postoperative nausea and vomiting)    Pre-diabetes    Prediabetes    Shortness of breath on exertion    Sleep apnea    "dx'd ~ 2008; never RX'd mask" (02/28/2016)    Past Surgical History:  Procedure Laterality Date   ACHILLES TENDON SURGERY Left ~ 2012   APPENDECTOMY  11/12/2015   lap appy   BUBBLE STUDY  12/04/2021   Procedure: BUBBLE STUDY;  Surgeon: James Red, MD;  Location: [facility] ENDOSCOPY;  Service: Cardiovascular;;   CLUB FOOT RELEASE Bilateral 1971   HERNIA REPAIR     INGUINAL LYMPH NODE BIOPSY Right 01/20/2016   Procedure: EXCISIONAL BIOPSY OF RIGHT INGUINAL LYMPH NODE;  Surgeon: James Adu, MD;  Location: [facility] OR;  Service: General;  Laterality: Right;   IR IMAGING GUIDED PORT INSERTION  11/02/2019   LAPAROSCOPIC APPENDECTOMY N/A 11/12/2015   Procedure: APPENDECTOMY LAPAROSCOPIC;  Surgeon: James Adu, MD;  Location: [facility] OR;  Service: General;  Laterality: N/A;   LYMPH NODE BIOPSY Left 03/20/2020   Procedure: EXCISIONAL BIOPSY LEFT INGUINAL LYMPH NODE;  Surgeon: James Miyamoto, MD;  Location: [facility] OR;  Service: General;  Laterality: Left;  LMA   PATENT FORAMEN OVALE(PFO) CLOSURE N/A 03/06/2022   Procedure: PATENT FORAMEN OVALE(PFO) CLOSURE;  Surgeon: James Pyo, MD;  Location: [facility] INVASIVE CV LAB;  Service: Cardiovascular;  Laterality: N/A;   SUTURE REMOVAL Left ~ 2016-2017 X 3   "had to use permanent sutures w/my achilles OR; my body rejects them at times & I have to have them surgically removed; under anesthesia"   TEE WITHOUT CARDIOVERSION N/A 12/04/2021   Procedure: TRANSESOPHAGEAL ECHOCARDIOGRAM (TEE);  Surgeon: James Red, MD;  Location: [facility] ENDOSCOPY;  Service: Cardiovascular;  Laterality: N/A;   VASECTOMY Bilateral 07/2019   VENTRAL HERNIA REPAIR  1994     reports that he quit smoking about 5  years ago. His smoking use included cigarettes. He has a 30.00 pack-year smoking history. He quit smokeless tobacco use about 27 years ago.  His smokeless tobacco use included snuff. He reports that he does not currently use alcohol. He reports that he does not currently use drugs after having used the following drugs: Marijuana.  Allergies  Allergen Reactions   Mango Flavor Swelling and Other (See Comments)    LIPS SWELL   Rituximab Other (See Comments)    Blood pressure went extremely low.    Shellfish Allergy Anaphylaxis   Lactose Intolerance (Gi) Diarrhea    Family History  Problem Relation Age of Onset   Colon cancer Neg Hx    Colon polyps Neg Hx    Esophageal cancer Neg Hx    Stomach cancer Neg Hx    Rectal cancer Neg Hx      Prior to Admission medications   Medication Sig Start Date End Date Taking? Authorizing Provider  acetaminophen (TYLENOL) 325 MG tablet Take 650 mg by mouth daily as needed for moderate pain or headache.   Yes [provider]  ALPRAZolam (XANAX) 0.25 MG tablet Take 0.25 mg by mouth 2 (two) times daily as needed for anxiety. 12/15/21  Yes [provider]  aspirin EC 81 MG tablet Take 1 tablet (81 mg total) by mouth daily. Swallow whole. 12/05/21  Yes Marvel Plan, MD  cetirizine (ZYRTEC) 10 MG tablet Take 10 mg by mouth daily.   Yes [provider]  clopidogrel (PLAVIX) 75 MG tablet Take 1 tablet (75 mg total) by mouth daily. 04/06/22  Yes Georgie Chard D, NP  escitalopram (LEXAPRO) 5 MG tablet Take 5 mg by mouth daily. 03/20/22  Yes [provider]  FARXIGA 10 MG TABS tablet Take 10 mg by mouth daily. 11/20/21  Yes [provider]  metoprolol succinate (TOPROL-XL) 25 MG 24 hr tablet TAKE 1 TABLET BY MOUTH DAILY. CAN TAKE ADDITIONAL HALF TABLET IF HEART RATE IS OVER 100 Patient taking differently: Take 25 mg by mouth daily. 04/10/22  Yes James Pyo, MD  rosuvastatin (CRESTOR) 40 MG tablet Take 1 tablet (40 mg  total) by mouth daily. 02/04/22  Yes James Pyo, MD  amoxicillin (AMOXIL) 500 MG capsule Take 4 capsules (2,000 mg total) by mouth as directed. Take 4 tablets 1 hour prior to dental work, including cleanings. Patient not taking: Reported on 06/03/2022 04/06/22   Georgie Chard D, NP  lidocaine-prilocaine (EMLA) cream Apply 1 application topically as needed. Apply to [facility] 1 hour prior to procedure. Patient not taking: Reported on 06/03/2022 11/08/19   Josph Macho, MD  metFORMIN (GLUCOPHAGE) 500 MG tablet Take 1 tablet (500 mg total) by mouth daily. Patient not taking: Reported on 06/03/2022 06/19/21   Langston Reusing, MD  multivitamin (ONE-A-DAY MEN'S) TABS tablet Take 1 tablet by mouth daily. Patient not taking: Reported on 06/03/2022    [provider]  polycarbophil (FIBERCON) 625 MG tablet Take 625 mg  by mouth daily. Patient not taking: Reported on 06/03/2022    [provider]    Physical Exam: Vitals:   06/03/22 1300 06/03/22 1315 06/03/22 1324 06/03/22 1326  BP: 102/62 (!) 101/59 109/63   Pulse: 97 100 92   Resp: 18  15 19   Temp:      TempSrc:      SpO2: 96% 97% 96%     Constitutional: NAD, calm, comfortable Vitals:   06/03/22 1300 06/03/22 1315 06/03/22 1324 06/03/22 1326  BP: 102/62 (!) 101/59 109/63   Pulse: 97 100 92   Resp: 18  15 19   Temp:      TempSrc:      SpO2: 96% 97% 96%    Eyes: PERRL, lids and conjunctivae normal ENMT: Mucous membranes are dry. Posterior pharynx clear of any exudate or lesions.Normal dentition.  Neck: normal, supple, no masses, no thyromegaly Respiratory: clear to auscultation bilaterally, no wheezing, no crackles. Normal respiratory effort. No accessory muscle use.  Cardiovascular: Regular rate and rhythm, no murmurs / rubs / gallops. No extremity edema. 2+ pedal pulses. No carotid bruits.  Abdomen: no tenderness, no masses palpated. No hepatosplenomegaly. Bowel sounds positive.  Musculoskeletal: no clubbing / cyanosis. No  joint deformity upper and lower extremities. Good ROM, no contractures. Normal muscle tone.  Skin: no rashes, lesions, ulcers. No induration Neurologic: CN 2-12 grossly intact. Sensation intact, DTR normal. Strength 5/5 in all 4.  Psychiatric: Normal judgment and insight. Alert and oriented x 3. Normal mood.    Labs on Admission: I have personally reviewed following labs and imaging studies  CBC: Recent Labs  Lab 06/03/22 1128  WBC 17.4*  NEUTROABS 13.6*  HGB 13.5  HCT 39.9  MCV 90.9  PLT 164   Basic Metabolic Panel: Recent Labs  Lab 06/03/22 1128  NA 132*  K 3.2*  CL 96*  CO2 24  GLUCOSE 167*  BUN 16  CREATININE 1.47*  CALCIUM 8.5*  PHOS 2.0*   GFR: CrCl cannot be calculated (Unknown ideal weight.). Liver Function Tests: Recent Labs  Lab 06/03/22 1128  AST 19  ALT 12  ALKPHOS 81  BILITOT 1.4*  PROT 6.1*  ALBUMIN 3.3*   No results for input(s): "LIPASE", "AMYLASE" in the last 168 hours. No results for input(s): "AMMONIA" in the last 168 hours. Coagulation Profile: No results for input(s): "INR", "PROTIME" in the last 168 hours. Cardiac Enzymes: No results for input(s): "CKTOTAL", "CKMB", "CKMBINDEX", "TROPONINI" in the last 168 hours. BNP (last 3 results) No results for input(s): "PROBNP" in the last 8760 hours. HbA1C: No results for input(s): "HGBA1C" in the last 72 hours. CBG: No results for input(s): "GLUCAP" in the last 168 hours. Lipid Profile: No results for input(s): "CHOL", "HDL", "LDLCALC", "TRIG", "CHOLHDL", "LDLDIRECT" in the last 72 hours. Thyroid Function Tests: No results for input(s): "TSH", "T4TOTAL", "FREET4", "T3FREE", "THYROIDAB" in the last 72 hours. Anemia Panel: No results for input(s): "VITAMINB12", "FOLATE", "FERRITIN", "TIBC", "IRON", "RETICCTPCT" in the last 72 hours. Urine analysis:    Component Value Date/Time   COLORURINE YELLOW 06/03/2022 1300   APPEARANCEUR CLEAR 06/03/2022 1300   LABSPEC 1.020 06/03/2022 1300    PHURINE 5.0 06/03/2022 1300   GLUCOSEU >=500 (A) 06/03/2022 1300   HGBUR SMALL (A) 06/03/2022 1300   BILIRUBINUR NEGATIVE 06/03/2022 1300   KETONESUR NEGATIVE 06/03/2022 1300   PROTEINUR NEGATIVE 06/03/2022 1300   NITRITE NEGATIVE 06/03/2022 1300   LEUKOCYTESUR NEGATIVE 06/03/2022 1300    Radiological Exams on Admission: DG Chest 2 View  Result Date: 06/03/2022 CLINICAL DATA:  Fever, cough. EXAM: CHEST - 2 VIEW COMPARISON:  Chest radiograph 04/30/2020 FINDINGS: The right chest wall port is stable with tip terminating in the mid SVC. A presumed atrial septal closure device is noted. The cardiomediastinal silhouette is normal. There is confluent lingular opacity. There is no other focal opacity. There is no pulmonary edema. There is no pleural effusion or pneumothorax There is no acute osseous abnormality. IMPRESSION: Confluent lingular opacity concerning for pneumonia. Recommend follow-up radiographs in 6-8 weeks to assess for resolution. Electronically Signed   By: Lesia Hausen M.D.   On: 06/03/2022 12:09    EKG: Independently reviewed.  Sinus tachycardia, no acute ST changes.  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 (For example, if patient is on BP meds at home and you resume or decide to hold them, it is a problem that needs to be her. Same for CAD, COPD, HLD and so on)  Sepsis, improved -Evidenced by tachycardia, elevated lactate, and leukocytosis, with signs of endorgan diatomaceous of AKI, source of infection considered to be the left lingula pneumonia. -Change antibiotics coverage to ceftriaxone and azithromycin -Sputum culture, atypical pneumonia study mycoplasma Legionella -Clinically appears to be volume contracted, continue IV fluid x 24 hours.  CAP -As above -Repeat chest x-ray in 8 weeks to  document resolution of left lingula pneumonia. -Other DDx, recent normal PET scan makes recurrent lymphoma unlikely.  His primary non-Hodgkin lymphoma appears to be below the diaphragm.  AKI -Secondary to sepsis, probably prerenal -Received IV bolus and continue maintenance IV fluid as above.   Hypokalemia-p.o. replacement  History of embolic stroke Oct 2023 -Status post PFO close H February 2024, on dual antiplatelet aspirin Plavix for 6 months.  IIDM -SSI for now -Hold off Farxiga due to kidney function surge  Hyponatremia -Hypovolemic, likely secondary to AKI.   -Volume resuscitation, reevaluate sodium level tomorrow.  DVT prophylaxis: Lovenox Code Status: Full Family Communication: None at bedside Disposition Plan: Expect less than 2 midnight hospital stay Consults called: None Admission status: Tele obs   Emeline General MD [facility] Pager [phone]  06/03/2022, 3:16 PM

## 2022-06-03 NOTE — Telephone Encounter (Signed)
Call received from patient's wife, Victorino Dike to notify Dr. Myna Hidalgo that pt is currently in the [facility] ER and will be admitted with pneumonia.  Dr. Myna Hidalgo notified.

## 2022-06-03 NOTE — Progress Notes (Signed)
Elink following code sepsis °

## 2022-06-04 DIAGNOSIS — A419 Sepsis, unspecified organism: Secondary | ICD-10-CM | POA: Diagnosis not present

## 2022-06-04 DIAGNOSIS — R6521 Severe sepsis with septic shock: Secondary | ICD-10-CM

## 2022-06-04 DIAGNOSIS — C8223 Follicular lymphoma grade III, unspecified, intra-abdominal lymph nodes: Secondary | ICD-10-CM

## 2022-06-04 DIAGNOSIS — N179 Acute kidney failure, unspecified: Secondary | ICD-10-CM

## 2022-06-04 DIAGNOSIS — Z1152 Encounter for screening for COVID-19: Secondary | ICD-10-CM | POA: Diagnosis not present

## 2022-06-04 DIAGNOSIS — J189 Pneumonia, unspecified organism: Secondary | ICD-10-CM | POA: Diagnosis not present

## 2022-06-04 DIAGNOSIS — I10 Essential (primary) hypertension: Secondary | ICD-10-CM | POA: Diagnosis not present

## 2022-06-04 LAB — BASIC METABOLIC PANEL
Anion gap: 10 (ref 5–15)
BUN: 14 mg/dL (ref 6–20)
CO2: 24 mmol/L (ref 22–32)
Calcium: 8.1 mg/dL — ABNORMAL LOW (ref 8.9–10.3)
Chloride: 100 mmol/L (ref 98–111)
Creatinine, Ser: 1.21 mg/dL (ref 0.61–1.24)
GFR, Estimated: >60 mL/min (ref >60)
Glucose, Bld: 147 mg/dL — ABNORMAL HIGH (ref 70–99)
Potassium: 3.6 mmol/L (ref 3.5–5.1)
Sodium: 134 mmol/L — ABNORMAL LOW (ref 135–145)

## 2022-06-04 LAB — CBC
HCT: 33.9 % — ABNORMAL LOW (ref 39.0–52.0)
Hemoglobin: 12 g/dL — ABNORMAL LOW (ref 13.0–17.0)
MCH: 31.7 pg (ref 26.0–34.0)
MCHC: 35.4 g/dL (ref 30.0–36.0)
MCV: 89.4 fL (ref 80.0–100.0)
Platelets: 134 10*3/uL — ABNORMAL LOW (ref 150–400)
RBC: 3.79 MIL/uL — ABNORMAL LOW (ref 4.22–5.81)
RDW: 13.2 % (ref 11.5–15.5)
WBC: 10.3 10*3/uL (ref 4.0–10.5)
nRBC: 0 % (ref 0.0–0.2)

## 2022-06-04 LAB — CULTURE, BLOOD (ROUTINE X 2): Culture: NO GROWTH

## 2022-06-04 LAB — MAGNESIUM: Magnesium: 1.8 mg/dL (ref 1.7–2.4)

## 2022-06-04 LAB — PHOSPHORUS: Phosphorus: 2.3 mg/dL — ABNORMAL LOW (ref 2.5–4.6)

## 2022-06-04 LAB — URINE CULTURE: Culture: NO GROWTH

## 2022-06-04 LAB — GLUCOSE, CAPILLARY
Glucose-Capillary: 124 mg/dL — ABNORMAL HIGH (ref 70–99)
Glucose-Capillary: 128 mg/dL — ABNORMAL HIGH (ref 70–99)

## 2022-06-04 LAB — MYCOPLASMA PNEUMONIAE ANTIBODY, IGM: Mycoplasma pneumo IgM: <770 U/mL (ref 0–769)

## 2022-06-04 MED ORDER — DOXYCYCLINE MONOHYDRATE 100 MG PO TABS: 100.0000 mg | ORAL_TABLET | Freq: Two times a day (BID) | ORAL | 0 refills | Status: AC

## 2022-06-04 MED ORDER — CHLORHEXIDINE GLUCONATE CLOTH 2 % EX PADS
6.0000 | MEDICATED_PAD | Freq: Every day | CUTANEOUS | Status: DC
  Administered 2022-06-04: 6 via TOPICAL

## 2022-06-04 MED ORDER — POTASSIUM PHOSPHATES 15 MMOLE/5ML IV SOLN
15.0000 mmol | Freq: Once | INTRAVENOUS | Status: AC
  Administered 2022-06-04: 15 mmol via INTRAVENOUS
  Filled 2022-06-04: qty 5

## 2022-06-04 MED ORDER — AMOXICILLIN-POT CLAVULANATE 875-125 MG PO TABS: 1.0000 | ORAL_TABLET | Freq: Two times a day (BID) | ORAL | 0 refills | Status: AC

## 2022-06-04 MED ORDER — SODIUM CHLORIDE 0.9 % IV SOLN: INTRAVENOUS | Status: DC | PRN

## 2022-06-04 MED ORDER — SODIUM CHLORIDE 0.9% FLUSH
10.0000 mL | Freq: Two times a day (BID) | INTRAVENOUS | Status: DC
  Administered 2022-06-04: 10 mL

## 2022-06-04 MED ORDER — HEPARIN SOD (PORK) LOCK FLUSH 100 UNIT/ML IV SOLN
500.0000 [IU] | INTRAVENOUS | Status: AC | PRN
  Administered 2022-06-04: 500 [IU]

## 2022-06-04 NOTE — Progress Notes (Signed)
Pt refused wheelchair and felt well enough to walk out. Chest port was deaccessed by IV team.

## 2022-06-04 NOTE — Discharge Summary (Signed)
Physician Discharge Summary  Patient ID: James Jennings MRN: [id number] DOB/AGE: [birth year] 52 y.o.  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  Patient presented with fever, chills and cough.  Patient was worked up and managed for possible sepsis/SIRS, community-acquired pneumonia, acute kidney injury and electrolyte abnormalities.  Patient was treated with IV Rocephin and azithromycin.  Fever has resolved.  Leukocytosis is resolved.  Patient has improved significantly.  Patient is eager to be discharged back home.  Patient be discharged back home to the care of the primary care provider and the oncology team.  The oncology team wants to assess patient on discharge to determine if there is need for IVIG.  Sepsis, improved -Evidenced by tachycardia, elevated lactate, and leukocytosis, with signs of endorgan damage, as patient had AKI. -Source of infection considered to be the left lingula pneumonia. -Patient was treated with IV ceftriaxone and azithromycin.   -Cultures are negative to date.    -Patient will be discharged back home on Augmentin and doxycycline.     CAP -As above -Repeat chest x-ray in 8 weeks to document resolution of left lingula pneumonia. -Other DDx, recent normal PET scan makes recurrent lymphoma unlikely.  His primary non-Hodgkin lymphoma appears to be below the diaphragm.   AKI -Secondary to sepsis, probably prerenal -Received IV bolus and continue maintenance IV fluid as above. -AKI has  resolved.     Hypokalemia -Resolved. -Potassium prior to discharge was 3.6. -Continue to monitor renal function and electrolytes closely.   History of embolic stroke Oct 2023 -Status post PFO close H February 2024, on dual antiplatelet aspirin Plavix for 6 months.   IIDM -SSI  -Restart Farxiga.   Hyponatremia -Improving. -Monitor renal function and electrolytes.    Consults: hematology/oncology  Significant Diagnostic Studies:  Cultures: Negative to date.  Chest x-ray revealed:Confluent lingular opacity concerning for pneumonia. Recommend follow-up radiographs in 6-8 weeks to assess for resolution".   Blood pressure (!) 107/59, pulse 96, temperature 98.2 F (36.8 C), temperature source Oral, resp. rate 16, SpO2 97 %.   Disposition: Discharge disposition: 01-Home or Self Care       Discharge Instructions     Ambulatory Referral for Lung Cancer Scre   Complete by: As directed    Diet - low sodium heart healthy   Complete by: As directed    Increase activity slowly   Complete by: As directed       Allergies as of 06/04/2022       Reactions   Mango Flavor Swelling, Other (See Comments)   LIPS SWELL   Rituximab Other (See Comments)   Blood pressure went extremely low.    Shellfish Allergy Anaphylaxis   Lactose Intolerance (gi) Diarrhea        Medication List     STOP taking these medications    amoxicillin 500 MG capsule Commonly known as: AMOXIL   lidocaine-prilocaine cream Commonly known as: EMLA   metFORMIN 500 MG tablet Commonly known as: GLUCOPHAGE   multivitamin Tabs tablet   polycarbophil 625 MG tablet Commonly known as: FIBERCON  TAKE these medications    acetaminophen 325 MG tablet Commonly known as: TYLENOL Take 650 mg by mouth daily as needed for moderate pain or headache.   ALPRAZolam 0.25 MG tablet Commonly known as: XANAX Take 0.25 mg by mouth 2 (two) times daily as needed for anxiety.   amoxicillin-clavulanate  875-125 MG tablet Commonly known as: AUGMENTIN Take 1 tablet by mouth 2 (two) times daily for 5 days.   aspirin EC 81 MG tablet Take 1 tablet (81 mg total) by mouth daily. Swallow whole.   cetirizine 10 MG tablet Commonly known as: ZYRTEC Take 10 mg by mouth daily.   clopidogrel 75 MG tablet Commonly known as: PLAVIX Take 1 tablet (75 mg total) by mouth daily.   doxycycline 100 MG tablet Commonly known as: ADOXA Take 1 tablet (100 mg total) by mouth 2 (two) times daily for 5 days.   escitalopram 5 MG tablet Commonly known as: LEXAPRO Take 5 mg by mouth daily.   Farxiga 10 MG Tabs tablet Generic drug: dapagliflozin propanediol Take 10 mg by mouth daily.   metoprolol succinate 25 MG 24 hr tablet Commonly known as: TOPROL-XL TAKE 1 TABLET BY MOUTH DAILY. CAN TAKE ADDITIONAL HALF TABLET IF HEART RATE IS OVER 100 What changed: See the new instructions.   rosuvastatin 40 MG tablet Commonly known as: CRESTOR Take 1 tablet (40 mg total) by mouth daily.        Time spent: 35 minutes.  SignedBarnetta Chapel 06/04/2022, 1:53 PM

## 2022-06-04 NOTE — Progress Notes (Signed)
Tele called at 0307 that patient had 5.5 second pause at 0304. Care RN checked on patient. Pt stated he felt little "woozy in the head" when he was reaching for his drink not to long ago. VSS, patient denied CP or SOB. No longer experiencing any symptoms. On call provider Virgel Manifold NP notified. See orders. EKG obtained and reported to provider for review. Awaiting on IV team to come draw the STAT labs. Phlebotomy unable to draw them due to patient having port in place.

## 2022-06-04 NOTE — Consult Note (Signed)
James Jennings is well-known to me.  He is very nice 52 year old white male.  He has history of recurrent follicular B cell non-Hodgkin's lymphoma.  He currently is only getting Gazyva as a maintenance therapy.  We last saw him back in March.    He does have other health issues.  Diabetes is the biggest issue.  He is doing a good job in managing this.  He did have a stroke back in January.  He was found to have a patent foramina ovale.  I think this was ultimately closed on 03/06/2022.  He says that on Tuesday he felt well and then as the day progressed he started to feel tired and was less stamina.  He did have a temperature of 103 degrees.  The next day, he began to feel worse.  Had a little bit of a cough.  Had little bit of nausea but no vomiting.  He had no diarrhea.  There were no rashes.  He had no bleeding.  He called his family doctor who recommended that he go to the ER.  He has had problems with hypotension.  As such, he was admitted.  He had a chest x-ray which showed an infiltrate in the lingula.  His labs when he was admitted showed a white count of 17.4.  Hemoglobin 13.5.  Platelet count 164,000.  He had normal white cell differential.  Today, his white count is 10.3.  Hemoglobin 12.  Hematocrit 33.9.  Platelet count 134,000.  He is on antibiotics with azithromycin and Rocephin.  Cultures have been taken.  He he feels pretty good.  Again his blood pressure has been on the low side.  He is getting some IV fluids.  [name] says he has been eating fairly well.  1 problem that we may have his had his IgG level might be on the low side.  We checked his immunoglobulins back in November 2023.  This showed IgG level of 585 mg/dL.  His IgA level was 133 mg/dL.  Overall, I would say that his performance status is probably ECOG 1.     His vital signs show temperature of 97.8.  Pulse 72.  Blood pressure 91/59.  Head and neck exam shows no scleral icterus.  He has no oral lesions.  There is no  adenopathy in the neck.  Lungs sound relatively clear bilaterally.  He has good air movement bilaterally.  Cardiac exam regular rate and rhythm.  There are no murmurs, rubs or bruits.  Abdomen is soft.  Bowel sounds are present.  There is no fluid wave.  There is no guarding or rebound tenderness.  There is no palpable hepatosplenomegaly.  Extremity shows no clubbing, cyanosis or edema.  Neurological exam shows no focal neurological deficits.   James Jennings is a 52 year old gentleman with follicular B cell non-Hodgkin's lymphoma.  He has had relapsed disease.  He was treated successfully with Bendamustine/Gazyva.  He currently is on maintenance Gazyva.  He gets this every 2 months.  I would think that his cultures should be negative.  He she looks quite good.  I would be worried about his IgG level.  This will be interesting to see how low it is.  We may have to think about giving him supplemental IVIG.  He says he actually might go home today.  If he does go home and his IgG level is on the low side, then we will have to get him back into the office to see about supplemental IVIG.  I know that he will get outstanding care from everybody on [address].  Christin Bach, MD  Psalm 62:1

## 2022-06-05 LAB — CULTURE, BLOOD (ROUTINE X 2): Culture: NO GROWTH

## 2022-06-06 LAB — IGG, IGA, IGM
IgA: 101 mg/dL (ref 90–386)
IgG (Immunoglobin G), Serum: 478 mg/dL — ABNORMAL LOW (ref 603–1613)
IgM (Immunoglobulin M), Srm: 11 mg/dL — ABNORMAL LOW (ref 20–172)

## 2022-06-06 LAB — CULTURE, BLOOD (ROUTINE X 2)

## 2022-06-07 LAB — CULTURE, BLOOD (ROUTINE X 2)

## 2022-06-08 LAB — LEGIONELLA PNEUMOPHILA SEROGP 1 UR AG: L. pneumophila Serogp 1 Ur Ag: NEGATIVE

## 2022-06-15 ENCOUNTER — Ambulatory Visit (INDEPENDENT_AMBULATORY_CARE_PROVIDER_SITE_OTHER): Payer: BC Managed Care – PPO | Admitting: Neurology

## 2022-06-15 ENCOUNTER — Inpatient Hospital Stay (HOSPITAL_BASED_OUTPATIENT_CLINIC_OR_DEPARTMENT_OTHER): Payer: BC Managed Care – PPO | Admitting: Hematology & Oncology

## 2022-06-15 ENCOUNTER — Inpatient Hospital Stay: Payer: BC Managed Care – PPO

## 2022-06-15 ENCOUNTER — Encounter: Payer: Self-pay | Admitting: Neurology

## 2022-06-15 ENCOUNTER — Other Ambulatory Visit: Payer: Self-pay

## 2022-06-15 ENCOUNTER — Encounter: Payer: Self-pay | Admitting: Hematology & Oncology

## 2022-06-15 ENCOUNTER — Inpatient Hospital Stay: Payer: BC Managed Care – PPO | Attending: Hematology & Oncology

## 2022-06-15 VITALS — BP 99/60 | HR 105 | Temp 98.4°F | Resp 18 | Ht 71.0 in | Wt 267.8 lb

## 2022-06-15 VITALS — BP 121/68 | HR 99 | Ht 71.0 in | Wt 267.0 lb

## 2022-06-15 VITALS — HR 97

## 2022-06-15 DIAGNOSIS — C8223 Follicular lymphoma grade III, unspecified, intra-abdominal lymph nodes: Secondary | ICD-10-CM | POA: Insufficient documentation

## 2022-06-15 DIAGNOSIS — R197 Diarrhea, unspecified: Secondary | ICD-10-CM | POA: Insufficient documentation

## 2022-06-15 DIAGNOSIS — Z79899 Other long term (current) drug therapy: Secondary | ICD-10-CM | POA: Diagnosis not present

## 2022-06-15 DIAGNOSIS — Z5112 Encounter for antineoplastic immunotherapy: Secondary | ICD-10-CM | POA: Insufficient documentation

## 2022-06-15 DIAGNOSIS — D801 Nonfamilial hypogammaglobulinemia: Secondary | ICD-10-CM | POA: Diagnosis not present

## 2022-06-15 DIAGNOSIS — Z8673 Personal history of transient ischemic attack (TIA), and cerebral infarction without residual deficits: Secondary | ICD-10-CM | POA: Diagnosis not present

## 2022-06-15 DIAGNOSIS — Q2112 Patent foramen ovale: Secondary | ICD-10-CM

## 2022-06-15 HISTORY — DX: Nonfamilial hypogammaglobulinemia: D80.1

## 2022-06-15 LAB — CMP (CANCER CENTER ONLY)
ALT: 8 U/L (ref 0–44)
AST: 10 U/L — ABNORMAL LOW (ref 15–41)
Albumin: 4 g/dL (ref 3.5–5.0)
Alkaline Phosphatase: 76 U/L (ref 38–126)
Anion gap: 9 (ref 5–15)
BUN: 12 mg/dL (ref 6–20)
CO2: 28 mmol/L (ref 22–32)
Calcium: 9.3 mg/dL (ref 8.9–10.3)
Chloride: 100 mmol/L (ref 98–111)
Creatinine: 1.03 mg/dL (ref 0.61–1.24)
GFR, Estimated: >60 mL/min (ref >60)
Glucose, Bld: 245 mg/dL — ABNORMAL HIGH (ref 70–99)
Potassium: 3.7 mmol/L (ref 3.5–5.1)
Sodium: 137 mmol/L (ref 135–145)
Total Bilirubin: 0.6 mg/dL (ref 0.3–1.2)
Total Protein: 6.5 g/dL (ref 6.5–8.1)

## 2022-06-15 LAB — CBC WITH DIFFERENTIAL (CANCER CENTER ONLY)
Abs Immature Granulocytes: 0.02 10*3/uL (ref 0.00–0.07)
Basophils Absolute: 0 10*3/uL (ref 0.0–0.1)
Basophils Relative: 0 %
Eosinophils Absolute: 0.1 10*3/uL (ref 0.0–0.5)
Eosinophils Relative: 1 %
HCT: 38.1 % — ABNORMAL LOW (ref 39.0–52.0)
Hemoglobin: 12.7 g/dL — ABNORMAL LOW (ref 13.0–17.0)
Immature Granulocytes: 0 %
Lymphocytes Relative: 9 %
Lymphs Abs: 0.7 10*3/uL (ref 0.7–4.0)
MCH: 30.3 pg (ref 26.0–34.0)
MCHC: 33.3 g/dL (ref 30.0–36.0)
MCV: 90.9 fL (ref 80.0–100.0)
Monocytes Absolute: 0.7 10*3/uL (ref 0.1–1.0)
Monocytes Relative: 8 %
Neutro Abs: 6.7 10*3/uL (ref 1.7–7.7)
Neutrophils Relative %: 82 %
Platelet Count: 303 10*3/uL (ref 150–400)
RBC: 4.19 MIL/uL — ABNORMAL LOW (ref 4.22–5.81)
RDW: 12.7 % (ref 11.5–15.5)
WBC Count: 8.2 10*3/uL (ref 4.0–10.5)
nRBC: 0 % (ref 0.0–0.2)

## 2022-06-15 LAB — LACTATE DEHYDROGENASE: LDH: 202 U/L — ABNORMAL HIGH (ref 98–192)

## 2022-06-15 MED ORDER — ACETAMINOPHEN 325 MG PO TABS
650.0000 mg | ORAL_TABLET | Freq: Once | ORAL | Status: AC
  Administered 2022-06-15: 650 mg via ORAL
  Filled 2022-06-15: qty 2

## 2022-06-15 MED ORDER — SODIUM CHLORIDE 0.9 % IV SOLN
1000.0000 mg | Freq: Once | INTRAVENOUS | Status: AC
  Administered 2022-06-15: 1000 mg via INTRAVENOUS
  Filled 2022-06-15: qty 40

## 2022-06-15 MED ORDER — SODIUM CHLORIDE 0.9 % IV SOLN
1000.0000 mg | Freq: Once | INTRAVENOUS | Status: DC
  Filled 2022-06-15: qty 40

## 2022-06-15 MED ORDER — SODIUM CHLORIDE 0.9 % IV SOLN: Freq: Once | INTRAVENOUS | Status: AC

## 2022-06-15 MED ORDER — HEPARIN SOD (PORK) LOCK FLUSH 100 UNIT/ML IV SOLN
500.0000 [IU] | Freq: Once | INTRAVENOUS | Status: AC | PRN
  Administered 2022-06-15: 500 [IU]

## 2022-06-15 MED ORDER — FAMOTIDINE IN NACL 20-0.9 MG/50ML-% IV SOLN
20.0000 mg | Freq: Once | INTRAVENOUS | Status: AC
  Administered 2022-06-15: 20 mg via INTRAVENOUS
  Filled 2022-06-15: qty 50

## 2022-06-15 MED ORDER — SODIUM CHLORIDE 0.9% FLUSH
10.0000 mL | INTRAVENOUS | Status: DC | PRN
  Administered 2022-06-15: 10 mL

## 2022-06-15 MED ORDER — DIPHENHYDRAMINE HCL 50 MG/ML IJ SOLN
12.5000 mg | Freq: Once | INTRAMUSCULAR | Status: AC
  Administered 2022-06-15: 12.5 mg via INTRAVENOUS
  Filled 2022-06-15: qty 1

## 2022-06-15 NOTE — Progress Notes (Signed)
Hematology and Oncology Follow Up Visit  James Jennings [id number] 1970/03/20 52 y.o. 06/15/2022   Principle Diagnosis:  Follicular B- cell non-Hodgkin's lymphoma - Relapsed Hypogammaglobulinemia   Past Therapy:             Rituxan/bendamustine-s/p cycle 6 - completed on 07/03/2016   Current Therapy:  Gazyva  - Cytoxan/ Vincristine/Prednisone  - started 11/07/2019, s/p cycle #4 --  D/c on 03/26/2020 G-CHOP -- s/p cycle #6-- started on 04/03/2020 -completed on 07/13/2020 Maintenance Gazyva-s/p cycle 10/12 --  start on 09/05/2020    Interim History:  James Jennings is here today for follow-up.  He was recently in the hospital because of pneumonia.  I suspect this might be secondary to him having little bit of low immunoglobulin level.  When we checked his IgG level in the hospital, it was on the lower side at 478 mg/dL.  I am rechecking this again today.  He does feel tired.  He still is on some antibiotics.  He has had blood sugars on the higher side.  I am sure this probably is somewhat contributing to the fact that he had pneumonia.  He has had no problems with bowel or bladder habits.  He does have little bit of diarrhea which might be from the antibiotics.  He says he takes Imodium and probiotic.  He has had no rashes.  He has had no leg swelling.  He says he has a little bit of a temperature this morning.  There has been no bleeding.  Overall, I would say that his performance status is probably ECOG 1.  Medications:  Allergies as of 06/15/2022       Reactions   Mango Flavor Swelling, Other (See Comments)   LIPS SWELL   Rituximab Other (See Comments)   Blood pressure went extremely low.    Shellfish Allergy Anaphylaxis   Lactose Intolerance (gi) Diarrhea        Medication List        Accurate as of Jun 15, 2022 10:18 AM. If you have any questions, ask your nurse or doctor.          acetaminophen 325 MG tablet Commonly known as: TYLENOL Take 650 mg by mouth daily as  needed for moderate pain or headache.   ALPRAZolam 0.25 MG tablet Commonly known as: XANAX Take 0.25 mg by mouth 2 (two) times daily as needed for anxiety.   amoxicillin 500 MG capsule Commonly known as: AMOXIL Take 4 capsules (2,000 mg total) by mouth as directed. Take 4 tablets 1 hour prior to dental work, including cleanings.   aspirin EC 81 MG tablet Take 1 tablet (81 mg total) by mouth daily. Swallow whole.   cetirizine 10 MG tablet Commonly known as: ZYRTEC Take 10 mg by mouth daily.   clopidogrel 75 MG tablet Commonly known as: PLAVIX Take 1 tablet (75 mg total) by mouth daily.   doxycycline 100 MG capsule Commonly known as: VIBRAMYCIN Take 100 mg by mouth 2 (two) times daily.   escitalopram 5 MG tablet Commonly known as: LEXAPRO Take 5 mg by mouth daily.   Farxiga 10 MG Tabs tablet Generic drug: dapagliflozin propanediol Take 10 mg by mouth daily.   HYDROcodone bit-homatropine 5-1.5 MG/5ML syrup Commonly known as: HYCODAN Take 5 mLs by mouth every 6 (six) hours as needed for cough.   metoprolol succinate 25 MG 24 hr tablet Commonly known as: TOPROL-XL TAKE 1 TABLET BY MOUTH DAILY. CAN TAKE ADDITIONAL HALF TABLET IF HEART RATE IS  OVER 100 What changed: See the new instructions.   rosuvastatin 40 MG tablet Commonly known as: CRESTOR Take 1 tablet (40 mg total) by mouth daily.        Allergies:  Allergies  Allergen Reactions   Mango Flavor Swelling and Other (See Comments)    LIPS SWELL   Rituximab Other (See Comments)    Blood pressure went extremely low.    Shellfish Allergy Anaphylaxis   Lactose Intolerance (Gi) Diarrhea    Past Medical History, Surgical history, Social history, and Family History were reviewed and updated.  Review of Systems: Review of Systems  Constitutional: Negative.   HENT: Negative.    Eyes: Negative.   Respiratory: Negative.    Cardiovascular: Negative.   Gastrointestinal: Negative.   Genitourinary: Negative.    Musculoskeletal: Negative.   Skin: Negative.   Neurological: Negative.   Endo/Heme/Allergies: Negative.   Psychiatric/Behavioral: Negative.       Physical Exam:  height is 5\' 11"  (1.803 m) and weight is 267 lb 12.8 oz (121.5 kg). His oral temperature is 98.4 F (36.9 C). His blood pressure is 99/60 and his pulse is 105 (abnormal). His respiration is 18 and oxygen saturation is 95%.   Wt Readings from Last 3 Encounters:  06/15/22 267 lb 12.8 oz (121.5 kg)  04/20/22 269 lb 12.8 oz (122.4 kg)  04/06/22 269 lb 9.6 oz (122.3 kg)  His vital signs are temperature of 98.4.  Pulse 105.  Blood pressure 99/60.  Weight is 267 pounds.    Physical Exam Vitals reviewed.  HENT:     Head: Normocephalic and atraumatic.  Eyes:     Pupils: Pupils are equal, round, and reactive to light.  Cardiovascular:     Rate and Rhythm: Normal rate and regular rhythm.     Heart sounds: Normal heart sounds.  Pulmonary:     Effort: Pulmonary effort is normal.     Breath sounds: Normal breath sounds.  Abdominal:     General: Bowel sounds are normal.     Palpations: Abdomen is soft.  Musculoskeletal:        General: No tenderness or deformity. Normal range of motion.     Cervical back: Normal range of motion.  Lymphadenopathy:     Cervical: No cervical adenopathy.  Skin:    General: Skin is warm and dry.     Findings: No erythema or rash.  Neurological:     Mental Status: He is alert and oriented to person, place, and time.  Psychiatric:        Behavior: Behavior normal.        Thought Content: Thought content normal.        Judgment: Judgment normal.      Lab Results  Component Value Date   WBC 8.2 06/15/2022   HGB 12.7 (L) 06/15/2022   HCT 38.1 (L) 06/15/2022   MCV 90.9 06/15/2022   PLT 303 06/15/2022   No results found for: "FERRITIN", "IRON", "TIBC", "UIBC", "IRONPCTSAT" Lab Results  Component Value Date   RETICCTPCT 1.07 12/06/2015   RBC 4.19 (L) 06/15/2022   RETICCTABS 48.79  12/06/2015   No results found for: "KPAFRELGTCHN", "LAMBDASER", "KAPLAMBRATIO" Lab Results  Component Value Date   IGGSERUM 478 (L) 06/04/2022   IGA 101 06/04/2022   IGMSERUM 11 (L) 06/04/2022   Lab Results  Component Value Date   TOTALPROTELP 6.1 12/25/2020   ALBUMINELP 3.7 12/25/2020   A1GS 0.2 12/25/2020   A2GS 0.7 12/25/2020   BETS 1.0 12/25/2020  GAMS 0.5 12/25/2020   MSPIKE Not Observed 12/25/2020     Chemistry      Component Value Date/Time   NA 134 (L) 06/04/2022 0354   NA 143 02/04/2022 1506   NA 141 12/03/2016 1155   NA 141 02/07/2016 1149   K 3.6 06/04/2022 0354   K 3.8 12/03/2016 1155   K 4.3 02/07/2016 1149   CL 100 06/04/2022 0354   CL 100 12/03/2016 1155   CO2 24 06/04/2022 0354   CO2 27 12/03/2016 1155   CO2 27 02/07/2016 1149   BUN 14 06/04/2022 0354   BUN 11 02/04/2022 1506   BUN 10 12/03/2016 1155   BUN 10.9 02/07/2016 1149   CREATININE 1.21 06/04/2022 0354   CREATININE 1.08 04/20/2022 1011   CREATININE 1.0 12/03/2016 1155   CREATININE 0.9 02/07/2016 1149      Component Value Date/Time   CALCIUM 8.1 (L) 06/04/2022 0354   CALCIUM 9.1 12/03/2016 1155   CALCIUM 9.6 02/07/2016 1149   ALKPHOS 81 06/03/2022 1128   ALKPHOS 99 (H) 12/03/2016 1155   ALKPHOS 127 02/07/2016 1149   AST 19 06/03/2022 1128   AST 13 (L) 04/20/2022 1011   AST 17 02/07/2016 1149   ALT 12 06/03/2022 1128   ALT 13 04/20/2022 1011   ALT 24 12/03/2016 1155   ALT 16 02/07/2016 1149   BILITOT 1.4 (H) 06/03/2022 1128   BILITOT 0.5 04/20/2022 1011   BILITOT 0.42 02/07/2016 1149       Impression and Plan: Mr. Olm is a pleasant 52 yo caucasian gentleman with history of follicular large cell non-Hodgkin's lymphoma. He completed 6 cycles of chemotherapy with bendamustine on 07/03/2016 but was not tolerant of Rituxan (anaphylaxis). His lymphoma has  recurred.  We then treated him with CHOP.  This got him into remission.  He is on maintenance Gazyva now.    We are rechecking  the immunoglobulin levels.  He will have 1 final treatment after today.  We will do this at the end of June.   Josph Macho, MD 5/13/202410:18 AM

## 2022-06-15 NOTE — Patient Instructions (Signed)

## 2022-06-15 NOTE — Progress Notes (Signed)
[facility] [address] [place]. [place] [address] [phone]       OFFICE FOLLOW-UP VISIT NOTE  [name] Date of Birth:  03/31/1970 Medical Record Number:  [id number]   Referring MD: Marvel Plan  Reason for Referral: Stroke  HPI: Initial visit  01/29/2022 [name] is a 52 year old Caucasian male seen today for initial office consultation visit for stroke.  History is obtained from the patient and review of electronic medical records.  I personally reviewed pertinent available imaging films in PACS.  He has past medical history of diabetes, hyperlipidemia sleep apnea, obesity and non-Hodgkin's lymphoma.  He developed sudden onset of confusion and vision difficulties when he was about to sit in the car to drive back from his son's school.  He fortunately called his wife who was nearby who came and drove him to [facility] emergency room.  EMS called a code stroke.  He was evaluated by Dr. Raynald Kemp and felt to have right homonymous hemianopsia and was given IV TNK.  His confusion resolved quickly and peripheral vision took several days to improve.  CT scan of the head showed no acute abnormality and CT angiogram of the neck and brain showed no large vessel stenosis or occlusion.  MRI scan of the brain showed an acute infarct involving left occipital cortex and hippocampal scale in the PCA distribution without any hemorrhage.  Echocardiogram shows ejection fraction of 60 to 65% and TEE showed patent foramen ovale with atrial septal aneurysm.  His rope score was5-6 and he has been referred for outpatient PFO closure evaluation to Dr. Lynnette Caffey.  He is currently wearing a 30-day heart monitor on which she had a brief episode of ventricular tachycardia and has been started on low-dose Toprol-XL 25 mg daily.  LDL cholesterol was 77 mg percent and hemoglobin A1c of 6.4.  He was started on aspirin and Plavix which she is tolerating well without bruising or bleeding.  He states his  peripheral vision also seems to have improved.  He is returned back to his baseline and has no respiratory deficits.  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  He has had some mild short-term memory difficulties for several years and feels that may have gotten slightly worse.  He is tolerating Lipitor well without muscle aches and pains..  He has no new complaints today.  He does have obstructive sleep apnea and has  been using his CPAP regularly. Update 06/15/2022 : He returns for follow-up after last visit 4 and half months ago.  He states he is doing well.  He denies known recurrent stroke or TIA symptoms.  He had elective endovascular PFO closed on 03/06/2022 by Dr. Lynnette Caffey and procedure went well without any complications.  He is presently on aspirin and Plavix and tolerating it well with minor bruising and no bleeding.  He has an upcoming appointment with Dr. Lynnette Caffey next month.  He had 4-week external cardiac monitor from 03/16/2022 to 04/14/2022 which was negative for paroxysmal A-fib.  He remains on Crestor which is tolerating well without muscle aches and pains.  He was briefly hospitalized last week to the hospital with pneumonia and is on antibiotics and feeling a lot better.  He states he is using his CPAP regularly for his sleep apnea.  ROS:   14 system review of systems is positive for pneumonia, confusion, vision difficulties and all other systems negative  PMH:  Past Medical History:  Diagnosis Date   Anxiety    Bilateral swelling of feet    Diabetes mellitus without complication ([name])    High cholesterol    Hypogammaglobulinemia ([name]) 06/15/2022   Joint pain    Lactose intolerance    Large cell, follicular non-Hodgkin's lymphoma ([name])    Lymphoma, follicular ([name]) dx'd 01/2016   Multiple food allergies    Other fatigue    Palpitations    PONV (postoperative nausea and vomiting)    Pre-diabetes    Prediabetes    Shortness of breath on exertion     Sleep apnea    "dx'd ~ 2008; never RX'd mask" (02/28/2016)    Social History:  Social History   Socioeconomic History   Marital status: Married    Spouse name: Densil Lare   Number of children: 1   Years of education: Not on file   Highest education level: Not on file  Occupational History   Occupation: auditor  Tobacco Use   Smoking status: Former    Packs/day: 1.00    Years: 30.00    Additional pack years: 0.00    Total pack years: 30.00    Types: Cigarettes    Quit date: 05/05/2017    Years since quitting: 5.1   Smokeless tobacco: Former    Types: Snuff    Quit date: 1997   Tobacco comments:    11/12/2015 "quit using chew in ~ 1997"  Vaping Use   Vaping Use: Never used  Substance and Sexual Activity   Alcohol use: Not Currently   Drug use: Not Currently    Types: Marijuana    Comment: "in the early 1990s; recreational"   Sexual activity: Yes    Partners: Female  Other Topics Concern   Not on file  Social History Narrative   Not on file   Social Determinants of Health   Financial Resource Strain: Not on file  Food Insecurity: No Food Insecurity (06/03/2022)   Hunger Vital Sign    Worried About Running Out of Food in the Last Year: Never true    Ran Out of Food in the Last Year: Never true  Transportation Needs: No Transportation Needs (06/03/2022)   PRAPARE - Administrator, Civil Service (Medical): No    Lack of Transportation (Non-Medical): No  Physical Activity: Not on file  Stress: Not on file  Social Connections: Not on file  Intimate Partner Violence: Not At Risk (06/03/2022)   Humiliation, Afraid, Rape, and Kick questionnaire    Fear of Current or Ex-Partner: No    Emotionally Abused: No    Physically Abused: No    Sexually Abused: No    Medications:   Current Outpatient Medications on File Prior to Visit  Medication Sig Dispense Refill   acetaminophen (TYLENOL) 325 MG tablet Take 650 mg by mouth daily as needed for moderate pain or  headache.     ALPRAZolam (XANAX) 0.25 MG tablet Take 0.25 mg by mouth 2 (two) times daily as needed for anxiety.     amoxapine (ASENDIN) 25 MG tablet Take 5 mg by mouth 2 (two) times daily.     amoxicillin (AMOXIL) 500 MG capsule Take 4 capsules (2,000 mg total) by mouth as directed. Take 4 tablets 1 hour prior to dental work, including cleanings. 12 capsule 12   aspirin EC 81 MG tablet Take 1 tablet (81 mg total) by mouth daily. Swallow whole. 30 tablet 12   cetirizine (ZYRTEC) 10 MG tablet Take 10 mg by mouth  daily.     clopidogrel (PLAVIX) 75 MG tablet Take 1 tablet (75 mg total) by mouth daily. 30 tablet 11   escitalopram (LEXAPRO) 5 MG tablet Take 5 mg by mouth daily.     FARXIGA 10 MG TABS tablet Take 10 mg by mouth daily.     metoprolol succinate (TOPROL-XL) 25 MG 24 hr tablet TAKE 1 TABLET BY MOUTH DAILY. CAN TAKE ADDITIONAL HALF TABLET IF HEART RATE IS OVER 100 (Patient taking differently: Take 25 mg by mouth daily.) 135 tablet 3   rosuvastatin (CRESTOR) 40 MG tablet Take 1 tablet (40 mg total) by mouth daily. 90 tablet 3   No current facility-administered medications on file prior to visit.    Allergies:   Allergies  Allergen Reactions   Mango Flavor Swelling and Other (See Comments)    LIPS SWELL   Rituximab Other (See Comments)    Blood pressure went extremely low.    Shellfish Allergy Anaphylaxis   Lactose Intolerance (Gi) Diarrhea    Physical Exam General: Mildly obese middle-aged Caucasian male, seated, in no evident distress Head: head normocephalic and atraumatic.   Neck: supple with no carotid or supraclavicular bruits Cardiovascular: regular rate and rhythm, no murmurs Musculoskeletal: no deformity Skin:  no rash/petichiae Vascular:  Normal pulses all extremities  Neurologic Exam Mental Status: Awake and fully alert. Oriented to place and time. Recent and remote memory intact. Attention span, concentration and fund of knowledge appropriate. Mood and affect  appropriate.  Recall 3/3.  Clock drawing 4/4.  Able to name 15 animals which can walk on 4 legs. Cranial Nerves: Fundoscopic exam reveals sharp disc margins. Pupils equal, briskly reactive to light. Extraocular movements full without nystagmus. Visual fields full to confrontation. Hearing intact. Facial sensation intact. Face, tongue, palate moves normally and symmetrically.  Motor: Normal bulk and tone. Normal strength in all tested extremity muscles. Sensory.: intact to touch , pinprick , position and vibratory sensation.  Coordination: Rapid alternating movements normal in all extremities. Finger-to-nose and heel-to-shin performed accurately bilaterally. Gait and Station: Arises from chair without difficulty. Stance is normal. Gait demonstrates normal stride length and balance . Able to heel, toe and tandem walk without difficulty.  Reflexes: 1+ and symmetric. Toes downgoing.   NIHSS  0 Modified Rankin  0   ASSESSMENT: 52 year old Caucasian male with left PCA branch infarct October 2023 of cryptogenic etiology treated with IV TNK with excellent clinical recovery no residual deficits.  Vascular risk factors of obesity, sleep apnea, diabetes, hypertension, hyperlipidemia and patent foramen ovale.     PLAN:I had a long d/w patient about his recent cryptogenic stroke, PFO closure,risk for recurrent stroke/TIAs, personally independently reviewed imaging studies and stroke evaluation results and answered questions.Continue aspirin 81 mg daily and clopidogrel 75 mg daily  for secondary stroke prevention for now but discussed with Dr.  Lynnette Caffey at upcoming visit about stopping one of them and maintain strict control of hypertension with blood pressure goal below 130/90, diabetes with hemoglobin A1c goal below 6.5% and lipids with LDL cholesterol goal below 70 mg/dL. I also advised the patient to eat a healthy diet with plenty of whole grains, cereals, fruits and vegetables, exercise regularly and  maintain ideal body weight.   he was also counseled to start using CPAP regularly for his sleep apnea .check follow-up with PCP bubble study to gauge the adequacy of endovascular PFO closure.  Followup in the future with me in 1 year or call earlier if necessary. Greater than 50% time during  this 35-minute   visit was spent on counseling and coordination of care about his cryptogenic stroke and PFO and discussion about stroke prevention and answering questions Delia Heady, MD Note: This document was prepared with digital dictation and possible smart phrase technology. Any transcriptional errors that result from this process are unintentional.

## 2022-06-15 NOTE — Patient Instructions (Signed)
I had a long d/w patient about his recent cryptogenic stroke, PFO closure,risk for recurrent stroke/TIAs, personally independently reviewed imaging studies and stroke evaluation results and answered questions.Continue aspirin 81 mg daily and clopidogrel 75 mg daily  for secondary stroke prevention for now but discussed with Dr.  Lynnette Caffey at upcoming visit about stopping one of them and maintain strict control of hypertension with blood pressure goal below 130/90, diabetes with hemoglobin A1c goal below 6.5% and lipids with LDL cholesterol goal below 70 mg/dL. I also advised the patient to eat a healthy diet with plenty of whole grains, cereals, fruits and vegetables, exercise regularly and maintain ideal body weight.   he was also counseled to start using CPAP regularly for his sleep apnea .check follow-up with PCP bubble study to gauge the adequacy of endovascular PFO closure.  Followup in the future with me in 1 year or call earlier if necessary.

## 2022-06-15 NOTE — Patient Instructions (Signed)
[facility]  Discharge Instructions: Thank you for choosing [facility] to provide your oncology and hematology care.   If you have a lab appointment with the Cancer Center, please go directly to the Cancer Center and check in at the registration area.  Wear comfortable clothing and clothing appropriate for easy access to any Portacath or PICC line.   We strive to give you quality time with your provider. You may need to reschedule your appointment if you arrive late (15 or more minutes).  Arriving late affects you and other patients whose appointments are after yours.  Also, if you miss three or more appointments without notifying the office, you may be dismissed from the clinic at the provider's discretion.      For prescription refill requests, have your pharmacy contact our office and allow 72 hours for refills to be completed.    Today you received the following chemotherapy and/or immunotherapy agents Gazyva.      To help prevent nausea and vomiting after your treatment, we encourage you to take your nausea medication as directed.  BELOW ARE SYMPTOMS THAT SHOULD BE REPORTED IMMEDIATELY: *FEVER GREATER THAN 100.4 F (38 C) OR HIGHER *CHILLS OR SWEATING *NAUSEA AND VOMITING THAT IS NOT CONTROLLED WITH YOUR NAUSEA MEDICATION *UNUSUAL SHORTNESS OF BREATH *UNUSUAL BRUISING OR BLEEDING *URINARY PROBLEMS (pain or burning when urinating, or frequent urination) *BOWEL PROBLEMS (unusual diarrhea, constipation, pain near the anus) TENDERNESS IN MOUTH AND THROAT WITH OR WITHOUT PRESENCE OF ULCERS (sore throat, sores in mouth, or a toothache) UNUSUAL RASH, SWELLING OR PAIN  UNUSUAL VAGINAL DISCHARGE OR ITCHING   Items with * indicate a potential emergency and should be followed up as soon as possible or go to the Emergency Department if any problems should occur.  Please show the CHEMOTHERAPY ALERT CARD or IMMUNOTHERAPY ALERT CARD at check-in  to the Emergency Department and triage nurse. Should you have questions after your visit or need to cancel or reschedule your appointment, please contact [facility]  [phone] and follow the prompts.  Office hours are 8:00 a.m. to 4:30 p.m. Monday - Friday. Please note that voicemails left after 4:00 p.m. may not be returned until the following business day.  We are closed weekends and major holidays. You have access to a nurse at all times for urgent questions. Please call the main number to the clinic [phone] and follow the prompts.  For any non-urgent questions, you may also contact your provider using MyChart. We now offer e-Visits for anyone 18 and older to request care online for non-urgent symptoms. For details visit [email or web address].   Also download the MyChart app! Go to the app store, search "MyChart", open the app, select [facility], and log in with your MyChart username and password.   

## 2022-06-16 ENCOUNTER — Other Ambulatory Visit: Payer: Self-pay

## 2022-06-16 ENCOUNTER — Encounter: Payer: Self-pay | Admitting: Hematology & Oncology

## 2022-06-16 LAB — KAPPA/LAMBDA LIGHT CHAINS
Kappa free light chain: 21.3 mg/L — ABNORMAL HIGH (ref 3.3–19.4)
Kappa, lambda light chain ratio: 1.38 (ref 0.26–1.65)
Lambda free light chains: 15.4 mg/L (ref 5.7–26.3)

## 2022-06-17 LAB — IGG, IGA, IGM
IgA: 121 mg/dL (ref 90–386)
IgG (Immunoglobin G), Serum: 550 mg/dL — ABNORMAL LOW (ref 603–1613)
IgM (Immunoglobulin M), Srm: 12 mg/dL — ABNORMAL LOW (ref 20–172)

## 2022-06-18 LAB — PROTEIN ELECTROPHORESIS, SERUM, WITH REFLEX
A/G Ratio: 1.2 (ref 0.7–1.7)
Albumin ELP: 3.1 g/dL (ref 2.9–4.4)
Alpha-1-Globulin: 0.3 g/dL (ref 0.0–0.4)
Alpha-2-Globulin: 0.9 g/dL (ref 0.4–1.0)
Beta Globulin: 1 g/dL (ref 0.7–1.3)
Gamma Globulin: 0.4 g/dL (ref 0.4–1.8)
Globulin, Total: 2.6 g/dL (ref 2.2–3.9)
Total Protein ELP: 5.7 g/dL — ABNORMAL LOW (ref 6.0–8.5)

## 2022-06-19 DIAGNOSIS — A419 Sepsis, unspecified organism: Secondary | ICD-10-CM | POA: Diagnosis not present

## 2022-06-30 DIAGNOSIS — E7800 Pure hypercholesterolemia, unspecified: Secondary | ICD-10-CM | POA: Diagnosis not present

## 2022-06-30 DIAGNOSIS — Z125 Encounter for screening for malignant neoplasm of prostate: Secondary | ICD-10-CM | POA: Diagnosis not present

## 2022-06-30 DIAGNOSIS — E119 Type 2 diabetes mellitus without complications: Secondary | ICD-10-CM | POA: Diagnosis not present

## 2022-07-21 ENCOUNTER — Ambulatory Visit (HOSPITAL_COMMUNITY)
Admission: RE | Admit: 2022-07-21 | Discharge: 2022-07-21 | Disposition: A | Discharge status: Home / Self Care | Payer: BC Managed Care – PPO | Source: Ambulatory Visit | Attending: Neurology | Admitting: Neurology

## 2022-07-21 DIAGNOSIS — Q2112 Patent foramen ovale: Secondary | ICD-10-CM | POA: Insufficient documentation

## 2022-07-27 ENCOUNTER — Inpatient Hospital Stay: Payer: BC Managed Care – PPO | Attending: Hematology & Oncology

## 2022-07-27 ENCOUNTER — Inpatient Hospital Stay (HOSPITAL_BASED_OUTPATIENT_CLINIC_OR_DEPARTMENT_OTHER): Payer: BC Managed Care – PPO | Admitting: Hematology & Oncology

## 2022-07-27 ENCOUNTER — Encounter: Payer: Self-pay | Admitting: Hematology & Oncology

## 2022-07-27 ENCOUNTER — Inpatient Hospital Stay: Payer: BC Managed Care – PPO

## 2022-07-27 ENCOUNTER — Other Ambulatory Visit: Payer: Self-pay

## 2022-07-27 VITALS — BP 113/73 | HR 85 | Resp 20

## 2022-07-27 DIAGNOSIS — Z5112 Encounter for antineoplastic immunotherapy: Secondary | ICD-10-CM | POA: Insufficient documentation

## 2022-07-27 DIAGNOSIS — D801 Nonfamilial hypogammaglobulinemia: Secondary | ICD-10-CM | POA: Insufficient documentation

## 2022-07-27 DIAGNOSIS — Z79899 Other long term (current) drug therapy: Secondary | ICD-10-CM | POA: Diagnosis not present

## 2022-07-27 DIAGNOSIS — Z9221 Personal history of antineoplastic chemotherapy: Secondary | ICD-10-CM | POA: Diagnosis not present

## 2022-07-27 DIAGNOSIS — C8223 Follicular lymphoma grade III, unspecified, intra-abdominal lymph nodes: Secondary | ICD-10-CM | POA: Diagnosis not present

## 2022-07-27 LAB — CBC WITH DIFFERENTIAL (CANCER CENTER ONLY)
Abs Immature Granulocytes: 0.02 10*3/uL (ref 0.00–0.07)
Basophils Absolute: 0 10*3/uL (ref 0.0–0.1)
Basophils Relative: 1 %
Eosinophils Absolute: 0.1 10*3/uL (ref 0.0–0.5)
Eosinophils Relative: 2 %
HCT: 41.8 % (ref 39.0–52.0)
Hemoglobin: 13.9 g/dL (ref 13.0–17.0)
Immature Granulocytes: 0 %
Lymphocytes Relative: 17 %
Lymphs Abs: 1.1 10*3/uL (ref 0.7–4.0)
MCH: 29.8 pg (ref 26.0–34.0)
MCHC: 33.3 g/dL (ref 30.0–36.0)
MCV: 89.7 fL (ref 80.0–100.0)
Monocytes Absolute: 0.9 10*3/uL (ref 0.1–1.0)
Monocytes Relative: 13 %
Neutro Abs: 4.4 10*3/uL (ref 1.7–7.7)
Neutrophils Relative %: 67 %
Platelet Count: 229 10*3/uL (ref 150–400)
RBC: 4.66 MIL/uL (ref 4.22–5.81)
RDW: 13.2 % (ref 11.5–15.5)
WBC Count: 6.6 10*3/uL (ref 4.0–10.5)
nRBC: 0 % (ref 0.0–0.2)

## 2022-07-27 LAB — CMP (CANCER CENTER ONLY)
ALT: 13 U/L (ref 0–44)
AST: 13 U/L — ABNORMAL LOW (ref 15–41)
Albumin: 4.4 g/dL (ref 3.5–5.0)
Alkaline Phosphatase: 117 U/L (ref 38–126)
Anion gap: 10 (ref 5–15)
BUN: 9 mg/dL (ref 6–20)
CO2: 27 mmol/L (ref 22–32)
Calcium: 9.4 mg/dL (ref 8.9–10.3)
Chloride: 104 mmol/L (ref 98–111)
Creatinine: 1.04 mg/dL (ref 0.61–1.24)
GFR, Estimated: >60 mL/min (ref >60)
Glucose, Bld: 131 mg/dL — ABNORMAL HIGH (ref 70–99)
Potassium: 4 mmol/L (ref 3.5–5.1)
Sodium: 141 mmol/L (ref 135–145)
Total Bilirubin: 0.6 mg/dL (ref 0.3–1.2)
Total Protein: 6.6 g/dL (ref 6.5–8.1)

## 2022-07-27 LAB — LACTATE DEHYDROGENASE: LDH: 131 U/L (ref 98–192)

## 2022-07-27 MED ORDER — HEPARIN SOD (PORK) LOCK FLUSH 100 UNIT/ML IV SOLN
500.0000 [IU] | Freq: Once | INTRAVENOUS | Status: AC | PRN
  Administered 2022-07-27: 500 [IU]

## 2022-07-27 MED ORDER — SODIUM CHLORIDE 0.9 % IV SOLN: Freq: Once | INTRAVENOUS | Status: AC

## 2022-07-27 MED ORDER — SODIUM CHLORIDE 0.9 % IV SOLN
1000.0000 mg | Freq: Once | INTRAVENOUS | Status: AC
  Administered 2022-07-27: 1000 mg via INTRAVENOUS
  Filled 2022-07-27: qty 40

## 2022-07-27 MED ORDER — ACETAMINOPHEN 325 MG PO TABS
650.0000 mg | ORAL_TABLET | Freq: Once | ORAL | Status: AC
  Administered 2022-07-27: 650 mg via ORAL
  Filled 2022-07-27: qty 2

## 2022-07-27 MED ORDER — FAMOTIDINE IN NACL 20-0.9 MG/50ML-% IV SOLN
20.0000 mg | Freq: Once | INTRAVENOUS | Status: AC
  Administered 2022-07-27: 20 mg via INTRAVENOUS
  Filled 2022-07-27: qty 50

## 2022-07-27 MED ORDER — SODIUM CHLORIDE 0.9% FLUSH
10.0000 mL | INTRAVENOUS | Status: DC | PRN
  Administered 2022-07-27: 10 mL

## 2022-07-27 MED ORDER — DIPHENHYDRAMINE HCL 50 MG/ML IJ SOLN
12.5000 mg | Freq: Once | INTRAMUSCULAR | Status: AC
  Administered 2022-07-27: 12.5 mg via INTRAVENOUS
  Filled 2022-07-27: qty 1

## 2022-07-27 NOTE — Patient Instructions (Signed)

## 2022-07-27 NOTE — Progress Notes (Signed)
Hematology and Oncology Follow Up Visit  James Jennings [id number] 01-11-1971 52 y.o. 07/27/2022   Principle Diagnosis:  Follicular B- cell non-Hodgkin's lymphoma - Relapsed Hypogammaglobulinemia   Past Therapy:             Rituxan/bendamustine-s/p cycle 6 - completed on 07/03/2016   Current Therapy:  Gazyva  - Cytoxan/ Vincristine/Prednisone  - started 11/07/2019, s/p cycle #4 --  D/c on 03/26/2020 G-CHOP -- s/p cycle #6-- started on 04/03/2020 -completed on 07/13/2020 Maintenance Gazyva-s/p cycle 10/12 --  start on 09/05/2020    Interim History:  James Jennings is here today for follow-up.  This will be his last dose of Gazyva.  He is on incredibly well.  So far, has been no evidence of recurrence of his lymphoma.  He is watching his weight.  His weights come down a little bit.  He is trying to stay active.  He had a nice time at the beach with his family.  He had his PFO closed about a week ago.  This really I think was essential so that he does not have another cerebrovascular event.  His last IgG level back in May was 550 mg/dL.  He has had no fever.  There is been no change in bowel or bladder habits.  He has had no rashes.  There is been no leg swelling.  He has had no headache.  Overall, I would say that his performance status is ECOG 1.   Medications:  Allergies as of 07/27/2022       Reactions   Mango Flavor Swelling, Other (See Comments)   LIPS SWELL   Rituximab Other (See Comments)   Blood pressure went extremely low.    Shellfish Allergy Anaphylaxis   Lactose Intolerance (gi) Diarrhea        Medication List        Accurate as of July 27, 2022 10:43 AM. If you have any questions, ask your nurse or doctor.          STOP taking these medications    amoxapine 25 MG tablet Commonly known as: ASENDIN Stopped by: Josph Macho, MD   amoxicillin 500 MG capsule Commonly known as: AMOXIL Stopped by: Josph Macho, MD   doxycycline 100 MG capsule Commonly  known as: VIBRAMYCIN Stopped by: Josph Macho, MD   HYDROcodone bit-homatropine 5-1.5 MG/5ML syrup Commonly known as: HYCODAN Stopped by: Josph Macho, MD       TAKE these medications    acetaminophen 325 MG tablet Commonly known as: TYLENOL Take 650 mg by mouth daily as needed for moderate pain or headache.   ALPRAZolam 0.25 MG tablet Commonly known as: XANAX Take 0.25 mg by mouth 2 (two) times daily as needed for anxiety.   aspirin EC 81 MG tablet Take 1 tablet (81 mg total) by mouth daily. Swallow whole.   cetirizine 10 MG tablet Commonly known as: ZYRTEC Take 10 mg by mouth daily.   clopidogrel 75 MG tablet Commonly known as: PLAVIX Take 1 tablet (75 mg total) by mouth daily.   escitalopram 5 MG tablet Commonly known as: LEXAPRO Take 5 mg by mouth daily.   Farxiga 10 MG Tabs tablet Generic drug: dapagliflozin propanediol Take 10 mg by mouth daily.   ipratropium 0.03 % nasal spray Commonly known as: ATROVENT Place 2 sprays into both nostrils 2 (two) times daily.   lidocaine-prilocaine cream Commonly known as: EMLA Apply 1 Application topically as needed. Apply to [facility] 1 hour prior to  procedure.   metFORMIN 500 MG tablet Commonly known as: GLUCOPHAGE Take 500 mg by mouth daily.   metoprolol succinate 25 MG 24 hr tablet Commonly known as: TOPROL-XL TAKE 1 TABLET BY MOUTH DAILY. CAN TAKE ADDITIONAL HALF TABLET IF HEART RATE IS OVER 100 What changed: See the new instructions.   rosuvastatin 40 MG tablet Commonly known as: CRESTOR Take 1 tablet (40 mg total) by mouth daily.        Allergies:  Allergies  Allergen Reactions   Mango Flavor Swelling and Other (See Comments)    LIPS SWELL   Rituximab Other (See Comments)    Blood pressure went extremely low.    Shellfish Allergy Anaphylaxis   Lactose Intolerance (Gi) Diarrhea    Past Medical History, Surgical history, Social history, and Family History were reviewed and updated.  Review of  Systems: Review of Systems  Constitutional: Negative.   HENT: Negative.    Eyes: Negative.   Respiratory: Negative.    Cardiovascular: Negative.   Gastrointestinal: Negative.   Genitourinary: Negative.   Musculoskeletal: Negative.   Skin: Negative.   Neurological: Negative.   Endo/Heme/Allergies: Negative.   Psychiatric/Behavioral: Negative.       Physical Exam:  height is 5\' 11"  (1.803 m) and weight is 264 lb (119.7 kg). His oral temperature is 99.2 F (37.3 C). His blood pressure is 121/77 and his pulse is 74. His respiration is 19 and oxygen saturation is 96%.   Wt Readings from Last 3 Encounters:  07/27/22 264 lb (119.7 kg)  06/15/22 267 lb (121.1 kg)  06/15/22 267 lb 12.8 oz (121.5 kg)  His vital signs are temperature of 98.4.  Pulse 105.  Blood pressure 99/60.  Weight is 267 pounds.    Physical Exam Vitals reviewed.  HENT:     Head: Normocephalic and atraumatic.  Eyes:     Pupils: Pupils are equal, round, and reactive to light.  Cardiovascular:     Rate and Rhythm: Normal rate and regular rhythm.     Heart sounds: Normal heart sounds.  Pulmonary:     Effort: Pulmonary effort is normal.     Breath sounds: Normal breath sounds.  Abdominal:     General: Bowel sounds are normal.     Palpations: Abdomen is soft.  Musculoskeletal:        General: No tenderness or deformity. Normal range of motion.     Cervical back: Normal range of motion.  Lymphadenopathy:     Cervical: No cervical adenopathy.  Skin:    General: Skin is warm and dry.     Findings: No erythema or rash.  Neurological:     Mental Status: He is alert and oriented to person, place, and time.  Psychiatric:        Behavior: Behavior normal.        Thought Content: Thought content normal.        Judgment: Judgment normal.      Lab Results  Component Value Date   WBC 6.6 07/27/2022   HGB 13.9 07/27/2022   HCT 41.8 07/27/2022   MCV 89.7 07/27/2022   PLT 229 07/27/2022   No results found for:  "FERRITIN", "IRON", "TIBC", "UIBC", "IRONPCTSAT" Lab Results  Component Value Date   RETICCTPCT 1.07 12/06/2015   RBC 4.66 07/27/2022   RETICCTABS 48.79 12/06/2015   Lab Results  Component Value Date   KPAFRELGTCHN 21.3 (H) 06/15/2022   LAMBDASER 15.4 06/15/2022   KAPLAMBRATIO 1.38 06/15/2022   Lab Results  Component Value  Date   IGGSERUM 550 (L) 06/15/2022   IGA 121 06/15/2022   IGMSERUM 12 (L) 06/15/2022   Lab Results  Component Value Date   TOTALPROTELP 5.7 (L) 06/15/2022   ALBUMINELP 3.1 06/15/2022   A1GS 0.3 06/15/2022   A2GS 0.9 06/15/2022   BETS 1.0 06/15/2022   GAMS 0.4 06/15/2022   MSPIKE Not Observed 06/15/2022     Chemistry      Component Value Date/Time   NA 141 07/27/2022 0950   NA 143 02/04/2022 1506   NA 141 12/03/2016 1155   NA 141 02/07/2016 1149   K 4.0 07/27/2022 0950   K 3.8 12/03/2016 1155   K 4.3 02/07/2016 1149   CL 104 07/27/2022 0950   CL 100 12/03/2016 1155   CO2 27 07/27/2022 0950   CO2 27 12/03/2016 1155   CO2 27 02/07/2016 1149   BUN 9 07/27/2022 0950   BUN 11 02/04/2022 1506   BUN 10 12/03/2016 1155   BUN 10.9 02/07/2016 1149   CREATININE 1.04 07/27/2022 0950   CREATININE 1.0 12/03/2016 1155   CREATININE 0.9 02/07/2016 1149      Component Value Date/Time   CALCIUM 9.4 07/27/2022 0950   CALCIUM 9.1 12/03/2016 1155   CALCIUM 9.6 02/07/2016 1149   ALKPHOS 117 07/27/2022 0950   ALKPHOS 99 (H) 12/03/2016 1155   ALKPHOS 127 02/07/2016 1149   AST 13 (L) 07/27/2022 0950   AST 17 02/07/2016 1149   ALT 13 07/27/2022 0950   ALT 24 12/03/2016 1155   ALT 16 02/07/2016 1149   BILITOT 0.6 07/27/2022 0950   BILITOT 0.42 02/07/2016 1149       Impression and Plan: James Jennings is a pleasant 52 yo caucasian gentleman with history of follicular large cell non-Hodgkin's lymphoma. He completed 6 cycles of chemotherapy with bendamustine on 07/03/2016 but was not tolerant of Rituxan (anaphylaxis). His lymphoma has  recurred.  We then treated him  with CHOP.  This got him into remission.  He is on maintenance Gazyva now.    This will be his last dose of Gazyva.  I will set him up with a PET scan in about 2 months.  We will see what his IgG level is.  I would have to think that this should be okay.  At this point, we will just follow-up closely.  Will set him up with a follow-up probably close to Labor Day.   Josph Macho, MD 6/24/202410:43 AM

## 2022-07-28 LAB — KAPPA/LAMBDA LIGHT CHAINS
Kappa free light chain: 14.7 mg/L (ref 3.3–19.4)
Kappa, lambda light chain ratio: 1.11 (ref 0.26–1.65)
Lambda free light chains: 13.2 mg/L (ref 5.7–26.3)

## 2022-07-28 LAB — BETA 2 MICROGLOBULIN, SERUM: Beta-2 Microglobulin: 1.6 mg/L (ref 0.6–2.4)

## 2022-07-29 DIAGNOSIS — Z1212 Encounter for screening for malignant neoplasm of rectum: Secondary | ICD-10-CM | POA: Diagnosis not present

## 2022-07-29 LAB — IGG, IGA, IGM
IgA: 116 mg/dL (ref 90–386)
IgG (Immunoglobin G), Serum: 599 mg/dL — ABNORMAL LOW (ref 603–1613)
IgM (Immunoglobulin M), Srm: 12 mg/dL — ABNORMAL LOW (ref 20–172)

## 2022-07-30 DIAGNOSIS — C8223 Follicular lymphoma grade III, unspecified, intra-abdominal lymph nodes: Secondary | ICD-10-CM | POA: Diagnosis not present

## 2022-07-30 DIAGNOSIS — Z1339 Encounter for screening examination for other mental health and behavioral disorders: Secondary | ICD-10-CM | POA: Diagnosis not present

## 2022-07-30 DIAGNOSIS — Z1331 Encounter for screening for depression: Secondary | ICD-10-CM | POA: Diagnosis not present

## 2022-07-30 DIAGNOSIS — R82998 Other abnormal findings in urine: Secondary | ICD-10-CM | POA: Diagnosis not present

## 2022-07-30 DIAGNOSIS — Z0000 Encounter for general adult medical examination without abnormal findings: Secondary | ICD-10-CM | POA: Diagnosis not present

## 2022-07-31 NOTE — Progress Notes (Signed)
Kindly inform patient that TCD Bubble study was negative

## 2022-09-07 DIAGNOSIS — H1012 Acute atopic conjunctivitis, left eye: Secondary | ICD-10-CM | POA: Diagnosis not present

## 2022-09-17 ENCOUNTER — Ambulatory Visit (HOSPITAL_COMMUNITY)
Admission: RE | Admit: 2022-09-17 | Discharge: 2022-09-17 | Disposition: A | Discharge status: Home / Self Care | Payer: BC Managed Care – PPO | Source: Ambulatory Visit | Attending: Hematology & Oncology | Admitting: Hematology & Oncology

## 2022-09-17 DIAGNOSIS — C8590 Non-Hodgkin lymphoma, unspecified, unspecified site: Secondary | ICD-10-CM | POA: Diagnosis not present

## 2022-09-17 DIAGNOSIS — C8223 Follicular lymphoma grade III, unspecified, intra-abdominal lymph nodes: Secondary | ICD-10-CM | POA: Insufficient documentation

## 2022-09-17 DIAGNOSIS — Z452 Encounter for adjustment and management of vascular access device: Secondary | ICD-10-CM | POA: Diagnosis not present

## 2022-09-17 DIAGNOSIS — J439 Emphysema, unspecified: Secondary | ICD-10-CM | POA: Diagnosis not present

## 2022-09-17 DIAGNOSIS — I700 Atherosclerosis of aorta: Secondary | ICD-10-CM | POA: Diagnosis not present

## 2022-09-17 LAB — GLUCOSE, CAPILLARY: Glucose-Capillary: 153 mg/dL — ABNORMAL HIGH (ref 70–99)

## 2022-09-17 MED ORDER — FLUDEOXYGLUCOSE F - 18 (FDG) INJECTION
12.2000 | Freq: Once | INTRAVENOUS | Status: AC
  Administered 2022-09-17: 13.29 via INTRAVENOUS

## 2022-09-22 ENCOUNTER — Other Ambulatory Visit: Payer: BC Managed Care – PPO

## 2022-09-22 ENCOUNTER — Encounter: Payer: Self-pay | Admitting: Gastroenterology

## 2022-09-22 ENCOUNTER — Ambulatory Visit (INDEPENDENT_AMBULATORY_CARE_PROVIDER_SITE_OTHER): Payer: BC Managed Care – PPO | Admitting: Gastroenterology

## 2022-09-22 VITALS — BP 122/66 | HR 78 | Ht 71.75 in | Wt 270.0 lb

## 2022-09-22 DIAGNOSIS — K921 Melena: Secondary | ICD-10-CM

## 2022-09-22 DIAGNOSIS — Z7902 Long term (current) use of antithrombotics/antiplatelets: Secondary | ICD-10-CM

## 2022-09-22 DIAGNOSIS — R194 Change in bowel habit: Secondary | ICD-10-CM | POA: Diagnosis not present

## 2022-09-22 DIAGNOSIS — K649 Unspecified hemorrhoids: Secondary | ICD-10-CM | POA: Diagnosis not present

## 2022-09-22 DIAGNOSIS — R140 Abdominal distension (gaseous): Secondary | ICD-10-CM | POA: Diagnosis not present

## 2022-09-22 DIAGNOSIS — R195 Other fecal abnormalities: Secondary | ICD-10-CM

## 2022-09-22 MED ORDER — HYDROCORTISONE ACETATE 25 MG RE SUPP: 25.0000 mg | Freq: Two times a day (BID) | RECTAL | 0 refills | Status: DC

## 2022-09-22 NOTE — Patient Instructions (Addendum)
Toileting tips to help with your constipation - Drink at least 64-80 ounces of water/liquid per day. - Establish a time to try to move your bowels every day.  For many people, this is after a cup of coffee or after a meal such as breakfast. - Sit all of the way back on the toilet keeping your back fairly straight and while sitting up, try to rest the tops of your forearms on your upper thighs.   - Raising your feet with a step stool/squatty potty can be helpful to improve the angle that allows your stool to pass through the rectum. - Relax the rectum feeling it bulge toward the toilet water.  If you feel your rectum raising toward your body, you are contracting rather than relaxing. - Breathe in and slowly exhale. "Belly breath" by expanding your belly towards your belly button. Keep belly expanded as you gently direct pressure down and back to the anus.  A low pitched GRRR sound can assist with increasing intra-abdominal pressure.  - Repeat 3-4 times. If unsuccessful, contract the pelvic floor to restore normal tone and get off the toilet.  Avoid excessive straining. - To reduce excessive wiping by teaching your anus to normally contract, place hands on outer aspect of knees and resist knee movement outward.  Hold 5-10 second then place hands just inside of knees and resist inward movement of knees.  Hold 5 seconds.  Repeat a few times each way.  You have been given a testing kit to check for small intestine bacterial overgrowth (SIBO) which is completed by a company named [facility]. Make sure to return your test in the mail using the return mailing label given to you along with the kit. The test order, your demographic and insurance information have all already been sent to the company. [facility] will collect an upfront charge of $99.74 for commercial insurance plans and $209.74 is you are paying cash. Make sure to discuss with [facility] PRIOR to having the test to see if they have  gotten information from your insurance company as to how much your testing will cost out of pocket, if any. Please contact [facility] at phone number [phone] to get instructions regarding how to perform the test as our office is unable to give specific testing instructions.  Restart Fiber Con once daily.   We have sent the following medications to your pharmacy for you to pick up at your convenience: Anusol Suppositories- Insert 1 suppository nightly for 1 week, then use every night until completion of prescription.   Your provider has requested that you go to the basement level for lab work before leaving today. Press "B" on the elevator. The lab is located at the first door on the left as you exit the elevator. Stool Kit , (please have other labs faxed to our office once completed.)    You have been scheduled for an endoscopy. Please follow written instructions given to you at your visit today.  If you use inhalers (even only as needed), please bring them with you on the day of your procedure.  If you take any of the following medications, they will need to be adjusted prior to your procedure:   DO NOT TAKE 7 DAYS PRIOR TO TEST- Trulicity (dulaglutide) Ozempic, Wegovy (semaglutide) Mounjaro (tirzepatide) Bydureon Bcise (exanatide extended release)  DO NOT TAKE 1 DAY PRIOR TO YOUR TEST Rybelsus (semaglutide) Adlyxin (lixisenatide) Victoza (liraglutide) Byetta (exanatide) ___________________________________________________________________________    Hemorrhoids Hemorrhoids are swollen veins in and around the  rectum or the opening of the butt (anus). There are two types of hemorrhoids: Internal. These occur in the veins just inside the rectum. They may poke through to the outside and become irritated and painful. External. These occur in the veins outside the anus. They can be felt as a painful swelling or hard lump near the anus. Most hemorrhoids do not cause severe  problems. Often, they can be treated at home with diet and lifestyle changes. If home treatments do not help, you may need a procedure to shrink or remove the hemorrhoids. What are the causes? Hemorrhoids are caused by pressure near the anus. This pressure may be caused by: Constipation or diarrhea. Straining to poop. Pregnancy. Obesity. Sitting or riding a bike for a long time. Heavy lifting or other things that cause you to strain. Anal sex. What are the signs or symptoms? Symptoms of this condition include: Pain. Anal itching or irritation. Bleeding from the rectum. Leakage of poop (stool). Swelling of the anus. One or more lumps around the anus. How is this diagnosed? Hemorrhoids can often be diagnosed through a visual exam. Other exams or tests may also be done, such as: A digital rectal exam. This is when your health care provider feels inside your rectum with a gloved finger. Anoscope. This is an exam of the anus using a small tube. A blood test, if you have lost a lot of blood. A sigmoidoscopy or colonoscopy. These are tests to look inside the colon using a tube with a camera on the end. How is this treated? In most cases, hemorrhoids can be treated at home with diet and lifestyle changes. If these changes do not help, you may need to have a procedure done. These procedures can make the hemorrhoids smaller or fully remove them. Common procedures include: Rubber band ligation. Rubber bands are placed at the base of the hemorrhoids to cut off their blood supply. Sclerotherapy. Medicine is put into the hemorrhoids to shrink them. Infrared coagulation. A type of light energy is used to get rid of the hemorrhoids. Hemorrhoidectomy surgery. The hemorrhoids are removed during surgery. Then, the veins that supply them are tied off. Stapled hemorrhoidopexy surgery. The base of the hemorrhoid is stapled to the wall of the rectum. Follow these instructions at home: Medicines Take  over-the-counter and prescription medicines only as told by your provider. Use medicated creams or medicines that are put in the rectum (suppositories) as told by your provider. Eating and drinking  Eat foods that are high in fiber, such as beans, whole grains, and fresh fruits and vegetables. Ask your provider about taking products that have fiber added to them (fiber supplements). Reduce the amount of fat in your diet. You can do this by eating low-fat dairy products, eating less red meat, and avoiding processed foods. Drink enough fluid to keep your pee (urine) pale yellow. Managing pain and swelling  Take warm sitz baths for 20 minutes, 3-4 times a day. This can help ease pain and discomfort. You may do this in a bathtub or you can use a portable sitz bath that fits over the toilet. If told, put ice on the affected area. It may help to use ice packs between sitz baths. Put ice in a plastic bag. Place a towel between your skin and the bag. Leave the ice on for 20 minutes, 2-3 times a day. If your skin turns bright red, remove the ice right away to prevent skin damage. The risk of damage is higher  if you cannot feel pain, heat, or cold. General instructions Exercise. Ask your provider how much and what kind of exercise is best for you. In general, you should do moderate exercise for at least 30 minutes on most days of the week (150 minutes each week). You may want to try walking, biking, or yoga. Go to the bathroom when you have the urge to poop. Do not wait. Avoid straining to poop. Keep the anus dry and clean. Use wet toilet paper or moist towelettes after you poop. Do not sit on the toilet for a long time. This can increase blood pooling and pain. Where to find more information [employer] and Digestive [employer] Kidney [employer]: [employer] Contact a health care provider if: You have more pain and swelling that do not get better with treatment. You have trouble pooping  or you are not able to poop. You have pain or inflammation outside the area of the hemorrhoids. Get help right away if: You are bleeding from your rectum and you cannot get it to stop. This information is not intended to replace advice given to you by your health care provider. Make sure you discuss any questions you have with your health care provider. Document Revised: 10/01/2021 Document Reviewed: 10/01/2021 Elsevier Patient Education  2024 [employer]   Thank you for choosing me and [facility].  Dr. Meridee Score

## 2022-09-22 NOTE — Progress Notes (Unsigned)
GASTROENTEROLOGY OUTPATIENT CLINIC VISIT   Primary Care Provider Garlan Fillers, MD [address] [place] [address] [phone]  Patient Profile: James Jennings is a 52 y.o. male with a pmh significant for prior CVA (on Plavix), status post PFO closure, diabetes, hyperlipidemia, follicular lymphoma, OSA, status post appendectomy, colon polyps (TA's and hyperplastic).  The patient presents to the [facility] for an evaluation and management of problem(s) noted below:  Problem List 1. Heme + stool   2. Hemorrhoids, unspecified hemorrhoid type   3. Change in stool caliber   4. Change in bowel habits   5. Hematochezia   6. Bloating symptom   7. Antiplatelet or antithrombotic long-term use     History of Present Illness The patient comes in for follow-up in clinic.  He was recently evaluated by his PCP and had a Hemoccult test performed.  This Hemoccult returned positive.  This was done during his normal physical examination and he had been experiencing issues with hemorrhoids and bleeding intermittently.  Patient describes having a pretty significant pneumonia during the spring of this year.  He was on multiple antibiotics during that time and the antibiotics caused significant changes in his bowel habits.  He has issues of loose stools at times and then more formed stools with watery stools as well intermixing depending on the day.  He has had some change in the caliber of his stool as well since the antibiotics.  The patient has noted blood on the toilet paper while wiping which she has attributed to hemorrhoids.  The patient has not used on a regular basis fiber supplementation as he used to [employer] had been 1 that he been using regularly previously).  The patient's most recent set of labs showed no evidence of persisting anemia though earlier this year did have a slight decrease in his hemoglobin.  Iron indices were not checked at that time.  The patient has not  used any topical therapies for his presumed hemorrhoidal bleeding.  Patient has never had an upper endoscopy.  GI Review of Systems Positive as above including bloating at times Negative for pyrosis, dysphagia, odynophagia, nausea, vomiting, melena  Review of Systems General: Denies fevers/chills/weight loss unintentionally Cardiovascular: Denies chest pain Pulmonary: Denies shortness of breath Gastroenterological: See HPI Genitourinary: Denies darkened urine Hematological: Denies easy bruising/bleeding Endocrine: Denies temperature intolerance Dermatological: Denies jaundice Psychological: Mood is stable   Medications Current Outpatient Medications  Medication Sig Dispense Refill   acetaminophen (TYLENOL) 325 MG tablet Take 650 mg by mouth daily as needed for moderate pain or headache.     ALPRAZolam (XANAX) 0.25 MG tablet Take 0.25 mg by mouth 2 (two) times daily as needed for anxiety.     aspirin EC 81 MG tablet Take 1 tablet (81 mg total) by mouth daily. Swallow whole. 30 tablet 12   cetirizine (ZYRTEC) 10 MG tablet Take 10 mg by mouth daily.     clopidogrel (PLAVIX) 75 MG tablet Take 1 tablet (75 mg total) by mouth daily. 30 tablet 11   escitalopram (LEXAPRO) 5 MG tablet Take 5 mg by mouth daily.     FARXIGA 10 MG TABS tablet Take 10 mg by mouth daily.     hydrocortisone (ANUSOL-HC) 25 MG suppository Place 1 suppository (25 mg total) rectally every 12 (twelve) hours. 12 suppository 0   ipratropium (ATROVENT) 0.03 % nasal spray Place 2 sprays into both nostrils 2 (two) times daily.     lidocaine-prilocaine (EMLA) cream Apply 1 Application  topically as needed. Apply to [facility] [place] [facility] 1 hour prior to procedure.     metFORMIN (GLUCOPHAGE) 500 MG tablet Take 500 mg by mouth daily.     metoprolol succinate (TOPROL-XL) 25 MG 24 hr tablet TAKE 1 TABLET BY MOUTH DAILY. CAN TAKE ADDITIONAL HALF TABLET IF HEART RATE IS OVER 100 (Patient taking differently: Take 25 mg by mouth daily.) 135 tablet 3    rosuvastatin (CRESTOR) 40 MG tablet Take 1 tablet (40 mg total) by mouth daily. 90 tablet 3   No current facility-administered medications for this visit.    Allergies Allergies  Allergen Reactions   Mango Flavor Swelling and Other (See Comments)    LIPS SWELL   Rituximab Other (See Comments)    Blood pressure went extremely low.    Shellfish Allergy Anaphylaxis   Lactose Intolerance (Gi) Diarrhea    Histories Past Medical History:  Diagnosis Date   Anxiety    Bilateral swelling of feet    Diabetes mellitus without complication ([name])    High cholesterol    Hypogammaglobulinemia ([name]) 06/15/2022   Joint pain    Lactose intolerance    Large cell, follicular non-Hodgkin's lymphoma ([name])    Lymphoma, follicular ([name]) dx'd 01/2016   Multiple food allergies    Other fatigue    Palpitations    PONV (postoperative nausea and vomiting)    Pre-diabetes    Prediabetes    Shortness of breath on exertion    Sleep apnea    "dx'd ~ 2008; never RX'd mask" (02/28/2016)   Past Surgical History:  Procedure Laterality Date   ACHILLES TENDON SURGERY Left ~ 2012   APPENDECTOMY  11/12/2015   lap appy   BUBBLE STUDY  12/04/2021   Procedure: BUBBLE STUDY;  Surgeon: Jodelle Red, MD;  Location: [facility] ENDOSCOPY;  Service: Cardiovascular;;   CLUB FOOT RELEASE Bilateral 1971   HERNIA REPAIR     INGUINAL LYMPH NODE BIOPSY Right 01/20/2016   Procedure: EXCISIONAL BIOPSY OF RIGHT INGUINAL LYMPH NODE;  Surgeon: Gaynelle Adu, MD;  Location: [facility] OR;  Service: General;  Laterality: Right;   IR IMAGING GUIDED PORT INSERTION  11/02/2019   LAPAROSCOPIC APPENDECTOMY N/A 11/12/2015   Procedure: APPENDECTOMY LAPAROSCOPIC;  Surgeon: Gaynelle Adu, MD;  Location: [facility] OR;  Service: General;  Laterality: N/A;   LYMPH NODE BIOPSY Left 03/20/2020   Procedure: EXCISIONAL BIOPSY LEFT INGUINAL LYMPH NODE;  Surgeon: Abigail Miyamoto, MD;  Location: [facility] OR;  Service: General;  Laterality: Left;  LMA   PATENT FORAMEN  OVALE(PFO) CLOSURE N/A 03/06/2022   Procedure: PATENT FORAMEN OVALE(PFO) CLOSURE;  Surgeon: Orbie Pyo, MD;  Location: [facility] INVASIVE CV LAB;  Service: Cardiovascular;  Laterality: N/A;   SUTURE REMOVAL Left ~ 2016-2017 X 3   "had to use permanent sutures w/my achilles OR; my body rejects them at times & I have to have them surgically removed; under anesthesia"   TEE WITHOUT CARDIOVERSION N/A 12/04/2021   Procedure: TRANSESOPHAGEAL ECHOCARDIOGRAM (TEE);  Surgeon: Jodelle Red, MD;  Location: [facility] ENDOSCOPY;  Service: Cardiovascular;  Laterality: N/A;   VASECTOMY Bilateral 07/2019   VENTRAL HERNIA REPAIR  1994   Social History   Socioeconomic History   Marital status: Married    Spouse name: Tyres Wierzba   Number of children: 1   Years of education: Not on file   Highest education level: Not on file  Occupational History   Occupation: auditor  Tobacco Use   Smoking status: Former    Current packs/day: 0.00  Average packs/day: 1 pack/day for 30.0 years (30.0 ttl pk-yrs)    Types: Cigarettes    Start date: 05/06/1987    Quit date: 05/05/2017    Years since quitting: 5.3   Smokeless tobacco: Former    Types: Snuff    Quit date: 1997   Tobacco comments:    11/12/2015 "quit using chew in ~ 1997"  Vaping Use   Vaping status: Never Used  Substance and Sexual Activity   Alcohol use: Not Currently   Drug use: Not Currently    Types: Marijuana    Comment: "in the early 1990s; recreational"   Sexual activity: Yes    Partners: Female  Other Topics Concern   Not on file  Social History Narrative   Not on file   Social Determinants of Health   Financial Resource Strain: Not on file  Food Insecurity: No Food Insecurity (06/03/2022)   Hunger Vital Sign    Worried About Running Out of Food in the Last Year: Never true    Ran Out of Food in the Last Year: Never true  Transportation Needs: No Transportation Needs (06/03/2022)   PRAPARE - Administrator, Civil Service  (Medical): No    Lack of Transportation (Non-Medical): No  Physical Activity: Not on file  Stress: Not on file  Social Connections: Not on file  Intimate Partner Violence: Not At Risk (06/03/2022)   Humiliation, Afraid, Rape, and Kick questionnaire    Fear of Current or Ex-Partner: No    Emotionally Abused: No    Physically Abused: No    Sexually Abused: No   Family History  Problem Relation Age of Onset   Colon cancer Neg Hx    Colon polyps Neg Hx    Esophageal cancer Neg Hx    Stomach cancer Neg Hx    Rectal cancer Neg Hx    Inflammatory bowel disease Neg Hx    Liver disease Neg Hx    Pancreatic cancer Neg Hx    I have reviewed his medical, social, and family history in detail and updated the electronic medical record as necessary.    PHYSICAL EXAMINATION  BP 122/66   Pulse 78   Ht 5' 11.75" (1.822 m)   Wt 270 lb (122.5 kg)   BMI 36.87 kg/m  Wt Readings from Last 3 Encounters:  09/22/22 270 lb (122.5 kg)  07/27/22 264 lb (119.7 kg)  06/15/22 267 lb (121.1 kg)  GEN: NAD, appears stated age, doesn't appear chronically ill PSYCH: Cooperative, without pressured speech EYE: Conjunctivae pink, sclerae anicteric ENT: MMM CV: Nontachycardic RESP: No audible wheezing GI: NABS, soft, NT, protuberant abdomen, without rebound or guarding GU: DRE shows very small skin tags externally, palpation of 2 columns of internal hemorrhoids and no palpable rectal lesions or masses, brown stool in vault, normal perineal descent MSK/EXT: No significant lower extremity edema SKIN: No jaundice NEURO:  Alert & Oriented x 3, no focal deficits   REVIEW OF DATA  I reviewed the following data at the time of this encounter:  GI Procedures and Studies  February 2023 colonoscopy - Hemorrhoids found on digital rectal exam. - The examined portion of the ileum was normal. - Five 2 to 8 mm polyps in the rectum, in the descending colon and in the transverse colon, removed with a cold snare. Resected  and retrieved. - Diverticulosis in the recto-sigmoid colon and in the sigmoid colon. - Normal mucosa in the entire examined colon otherwise. - Non-bleeding non-thrombosed internal hemorrhoids.  Pathology Diagnosis Surgical [P], colon, sigmoid, descending, transverse, and rectum, polyp (5) TUBULAR ADENOMAS HYPERPLASTIC POLYP NEGATIVE FOR HIGH-GRADE DYSPLASIA AND CARCINOMA  Laboratory Studies  Reviewed those in epic  Imaging Studies  Pending PET CT scan   ASSESSMENT  Mr. Albrecht is a 52 y.o. male with a pmh significant for prior CVA (on Plavix), status post PFO closure, diabetes, hyperlipidemia, follicular lymphoma, OSA, status post appendectomy, colon polyps (TA's and hyperplastic).  The patient is seen today for evaluation and management of:  1. Heme + stool   2. Hemorrhoids, unspecified hemorrhoid type   3. Change in stool caliber   4. Change in bowel habits   5. Hematochezia   6. Bloating symptom   7. Antiplatelet or antithrombotic long-term use    The patient is hemodynamically stable.  From a GI perspective, his hemorrhoids certainly are potential reason for his recent Hemoccult positive stool based test.  His recent colonoscopy within the last year and a half helps me feel a little bit better in regards to the stool test having been completed but the potential role of not needing to pursue a colonoscopy as of yet.  An upper endoscopy does make sense just to make sure we are not missing anything else.  He had a recent anemia but was also hospitalized with a pneumonia earlier this year.  We will need to get approval to hold his Plavix for 5 days prior to endoscopy, we will ask his PCP +/- neurology for the okay of this (he will be 1 year out from his stroke by the time of his endoscopy).  I think the changes in his bowel habits or stool caliber are more likely a result of the antibiotics that he had during his spring hospitalization.  I recommend SIBO breath testing and EPI evaluation as  well we will pursue that.  I also recommended the patient reinitiate fiber supplementation regularly.  We are going to initiate the patient on Anusol suppositories and then he can transition to Calmol-4 and topical therapies as needed.  If he continues to have issues, then I will refer him to one of my partners to consider hemorrhoidal banding.  The risks and benefits of endoscopic evaluation were discussed with the patient; these include but are not limited to the risk of perforation, infection, bleeding, missed lesions, lack of diagnosis, severe illness requiring hospitalization, as well as anesthesia and sedation related illnesses.  The patient and/or family is agreeable to proceed.  All patient questions were answered to the best of my ability, and the patient agrees to the aforementioned plan of action with follow-up as indicated.   PLAN  Laboratories as outlined below Stool studies as outlined below Reinitiate FiberCon daily to twice daily Anusol suppositories - Nightly for 1 week and then every other night until completed Then patient can initiate Calmol-4 suppositories as needed Patient can also initiate topical Preparation H cream to the region If patient continues to have issues then we will need to consider hemorrhoidal banding protocol Proceed with scheduling EGD - Will obtain hold of Plavix for 5 days   Orders Placed This Encounter  Procedures   Pancreatic elastase, fecal   IBC + Ferritin   IgA   Tissue transglutaminase, IgA   Ambulatory referral to Gastroenterology    New Prescriptions   HYDROCORTISONE (ANUSOL-HC) 25 MG SUPPOSITORY    Place 1 suppository (25 mg total) rectally every 12 (twelve) hours.   Modified Medications   No medications on file  Planned Follow Up No follow-ups on file.   Total Time in Face-to-Face and in Coordination of Care for patient including independent/personal interpretation/review of prior testing, medical history, examination,  medication adjustment, communicating results with the patient directly, and documentation within the EHR is 30 minutes.   Corliss Parish, MD [facility] Office # [phone]

## 2022-09-23 ENCOUNTER — Encounter: Payer: Self-pay | Admitting: Gastroenterology

## 2022-09-23 DIAGNOSIS — Z7902 Long term (current) use of antithrombotics/antiplatelets: Secondary | ICD-10-CM | POA: Insufficient documentation

## 2022-09-23 DIAGNOSIS — K921 Melena: Secondary | ICD-10-CM | POA: Insufficient documentation

## 2022-09-23 DIAGNOSIS — H9202 Otalgia, left ear: Secondary | ICD-10-CM | POA: Diagnosis not present

## 2022-09-23 DIAGNOSIS — R140 Abdominal distension (gaseous): Secondary | ICD-10-CM | POA: Insufficient documentation

## 2022-09-23 DIAGNOSIS — R195 Other fecal abnormalities: Secondary | ICD-10-CM | POA: Insufficient documentation

## 2022-09-23 DIAGNOSIS — K649 Unspecified hemorrhoids: Secondary | ICD-10-CM | POA: Insufficient documentation

## 2022-09-23 DIAGNOSIS — R194 Change in bowel habit: Secondary | ICD-10-CM | POA: Insufficient documentation

## 2022-09-24 ENCOUNTER — Encounter: Payer: Self-pay | Admitting: Gastroenterology

## 2022-09-24 ENCOUNTER — Encounter: Payer: Self-pay | Admitting: *Deleted

## 2022-09-24 ENCOUNTER — Telehealth: Payer: Self-pay

## 2022-09-24 NOTE — Telephone Encounter (Signed)
No issues for the stool study.  But if SIBO breath testing, there needs to be a hold to do that until 4-weeks after completion of the antibiotics. Thanks. GM

## 2022-09-24 NOTE — Telephone Encounter (Signed)
   Ivory Thorman [birth year] [id number]  Dear Dr Eloise Harman:  We have scheduled the above named patient for a(n) Endoscopy procedure. Our records show that (s)he is on anticoagulation therapy.  Please advise as to whether the patient may come off their therapy of Plavix 5 days prior to their procedure which is scheduled for 11/24/22.  Please route your response to Lucky Rathke, CMA  or fax response to [phone].  Sincerely,  Lucky Rathke, [facility]   [facility]

## 2022-09-24 NOTE — Telephone Encounter (Signed)
Dr Meridee Score is there any need to wait for stool studies due to abx?

## 2022-09-28 ENCOUNTER — Telehealth: Payer: Self-pay | Admitting: Gastroenterology

## 2022-09-28 ENCOUNTER — Inpatient Hospital Stay: Payer: BC Managed Care – PPO | Attending: Hematology & Oncology

## 2022-09-28 ENCOUNTER — Inpatient Hospital Stay: Payer: BC Managed Care – PPO

## 2022-09-28 ENCOUNTER — Inpatient Hospital Stay (HOSPITAL_BASED_OUTPATIENT_CLINIC_OR_DEPARTMENT_OTHER): Payer: BC Managed Care – PPO | Admitting: Hematology & Oncology

## 2022-09-28 ENCOUNTER — Other Ambulatory Visit: Payer: Self-pay | Admitting: Gastroenterology

## 2022-09-28 ENCOUNTER — Encounter: Payer: Self-pay | Admitting: Hematology & Oncology

## 2022-09-28 VITALS — BP 118/73 | HR 70 | Temp 97.8°F | Resp 17 | Ht 71.0 in | Wt 269.0 lb

## 2022-09-28 DIAGNOSIS — R194 Change in bowel habit: Secondary | ICD-10-CM | POA: Diagnosis not present

## 2022-09-28 DIAGNOSIS — K921 Melena: Secondary | ICD-10-CM | POA: Diagnosis not present

## 2022-09-28 DIAGNOSIS — Z7984 Long term (current) use of oral hypoglycemic drugs: Secondary | ICD-10-CM | POA: Insufficient documentation

## 2022-09-28 DIAGNOSIS — Z95828 Presence of other vascular implants and grafts: Secondary | ICD-10-CM

## 2022-09-28 DIAGNOSIS — Z7902 Long term (current) use of antithrombotics/antiplatelets: Secondary | ICD-10-CM | POA: Diagnosis not present

## 2022-09-28 DIAGNOSIS — D801 Nonfamilial hypogammaglobulinemia: Secondary | ICD-10-CM | POA: Insufficient documentation

## 2022-09-28 DIAGNOSIS — C8223 Follicular lymphoma grade III, unspecified, intra-abdominal lymph nodes: Secondary | ICD-10-CM | POA: Insufficient documentation

## 2022-09-28 DIAGNOSIS — Z9221 Personal history of antineoplastic chemotherapy: Secondary | ICD-10-CM | POA: Diagnosis not present

## 2022-09-28 DIAGNOSIS — R195 Other fecal abnormalities: Secondary | ICD-10-CM | POA: Diagnosis not present

## 2022-09-28 DIAGNOSIS — Z79899 Other long term (current) drug therapy: Secondary | ICD-10-CM | POA: Insufficient documentation

## 2022-09-28 DIAGNOSIS — K649 Unspecified hemorrhoids: Secondary | ICD-10-CM | POA: Diagnosis not present

## 2022-09-28 LAB — CMP (CANCER CENTER ONLY)
ALT: 15 U/L (ref 0–44)
AST: 14 U/L — ABNORMAL LOW (ref 15–41)
Albumin: 4.4 g/dL (ref 3.5–5.0)
Alkaline Phosphatase: 111 U/L (ref 38–126)
Anion gap: 7 (ref 5–15)
BUN: 10 mg/dL (ref 6–20)
CO2: 29 mmol/L (ref 22–32)
Calcium: 9 mg/dL (ref 8.9–10.3)
Chloride: 104 mmol/L (ref 98–111)
Creatinine: 1.01 mg/dL (ref 0.61–1.24)
GFR, Estimated: >60 mL/min (ref >60)
Glucose, Bld: 110 mg/dL — ABNORMAL HIGH (ref 70–99)
Potassium: 4 mmol/L (ref 3.5–5.1)
Sodium: 140 mmol/L (ref 135–145)
Total Bilirubin: 0.5 mg/dL (ref 0.3–1.2)
Total Protein: 6.5 g/dL (ref 6.5–8.1)

## 2022-09-28 LAB — CBC WITH DIFFERENTIAL (CANCER CENTER ONLY)
Abs Immature Granulocytes: 0.02 10*3/uL (ref 0.00–0.07)
Basophils Absolute: 0 10*3/uL (ref 0.0–0.1)
Basophils Relative: 1 %
Eosinophils Absolute: 0.1 10*3/uL (ref 0.0–0.5)
Eosinophils Relative: 2 %
HCT: 41.6 % (ref 39.0–52.0)
Hemoglobin: 13.8 g/dL (ref 13.0–17.0)
Immature Granulocytes: 0 %
Lymphocytes Relative: 17 %
Lymphs Abs: 1 10*3/uL (ref 0.7–4.0)
MCH: 29.8 pg (ref 26.0–34.0)
MCHC: 33.2 g/dL (ref 30.0–36.0)
MCV: 89.8 fL (ref 80.0–100.0)
Monocytes Absolute: 0.7 10*3/uL (ref 0.1–1.0)
Monocytes Relative: 12 %
Neutro Abs: 4 10*3/uL (ref 1.7–7.7)
Neutrophils Relative %: 68 %
Platelet Count: 213 10*3/uL (ref 150–400)
RBC: 4.63 MIL/uL (ref 4.22–5.81)
RDW: 13 % (ref 11.5–15.5)
WBC Count: 5.8 10*3/uL (ref 4.0–10.5)
nRBC: 0 % (ref 0.0–0.2)

## 2022-09-28 LAB — LACTATE DEHYDROGENASE: LDH: 153 U/L (ref 98–192)

## 2022-09-28 MED ORDER — HEPARIN SOD (PORK) LOCK FLUSH 100 UNIT/ML IV SOLN
500.0000 [IU] | Freq: Once | INTRAVENOUS | Status: AC
  Administered 2022-09-28: 500 [IU] via INTRAVENOUS

## 2022-09-28 MED ORDER — SODIUM CHLORIDE 0.9% FLUSH
10.0000 mL | INTRAVENOUS | Status: DC | PRN
  Administered 2022-09-28: 10 mL via INTRAVENOUS

## 2022-09-28 NOTE — Progress Notes (Signed)
Hematology and Oncology Follow Up Visit  James Jennings [id number] 1970/10/25 52 y.o. 09/28/2022   Principle Diagnosis:  Follicular B- cell non-Hodgkin's lymphoma - Relapsed Hypogammaglobulinemia   Past Therapy:             Rituxan/bendamustine-s/p cycle 6 - completed on 07/03/2016   Current Therapy:  Gazyva  - Cytoxan/ Vincristine/Prednisone  - started 11/07/2019, s/p cycle #4 --  D/c on 03/26/2020 G-CHOP -- s/p cycle #6-- started on 04/03/2020 -completed on 07/13/2020 Maintenance Gazyva-s/p cycle 10/12 --  start on 09/05/2020 --completed on 07/27/2022    Interim History:  James Jennings is here today for follow-up.  He is doing quite well.  He is getting over the fact that he have the CVA.  He did have the patent foramen ovale which was close I think back in June.  Hopefully, he will be able to be a little more active and lose a little bit of weight.  I know this will help him more than anything.  James Jennings did have a PET scan that was done.  The PET scan was done on 09/17/2022.  The PET scan did not show any obvious active lymphadenopathy.  However, there was some slight wall thickening in the rectum with associated hypermetabolism.  However, he had a colonoscopy a year ago which was unremarkable.  He also is going to see the gastroenterologist and have a upper endoscopy in October.  His last IgG level was okay at 600 mg/dL.  He has had no fever.  He has had no rashes.  He said little bit of leg swelling which is chronic.  There is no change in bowel or bladder habits.  I think he has had some issues with diarrhea.  Overall, I would say that his performance status is ECOG 1.     Medications:  Allergies as of 09/28/2022       Reactions   Mango Flavor Swelling, Other (See Comments)   LIPS SWELL   Rituximab Other (See Comments)   Blood pressure went extremely low.    Shellfish Allergy Anaphylaxis   Lactose Intolerance (gi) Diarrhea        Medication List        Accurate as of September 28, 2022 11:01 AM. If you have any questions, ask your nurse or doctor.          acetaminophen 325 MG tablet Commonly known as: TYLENOL Take 650 mg by mouth daily as needed for moderate pain or headache.   ALPRAZolam 0.25 MG tablet Commonly known as: XANAX Take 0.25 mg by mouth 2 (two) times daily as needed for anxiety.   amoxicillin-clavulanate 875-125 MG tablet Commonly known as: AUGMENTIN 1 tablet Orally every 12 hrs for 10 days   aspirin EC 81 MG tablet Take 1 tablet (81 mg total) by mouth daily. Swallow whole.   cetirizine 10 MG tablet Commonly known as: ZYRTEC Take 10 mg by mouth daily.   clopidogrel 75 MG tablet Commonly known as: PLAVIX Take 1 tablet (75 mg total) by mouth daily.   escitalopram 5 MG tablet Commonly known as: LEXAPRO Take 5 mg by mouth daily.   Farxiga 10 MG Tabs tablet Generic drug: dapagliflozin propanediol Take 10 mg by mouth daily.   hydrocortisone 25 MG suppository Commonly known as: ANUSOL-HC Place 1 suppository (25 mg total) rectally every 12 (twelve) hours.   ipratropium 0.03 % nasal spray Commonly known as: ATROVENT Place 2 sprays into both nostrils 2 (two) times daily.   lidocaine-prilocaine cream  Commonly known as: EMLA Apply 1 Application topically as needed. Apply to [facility] 1 hour prior to procedure.   metFORMIN 500 MG tablet Commonly known as: GLUCOPHAGE Take 500 mg by mouth daily.   metoprolol succinate 25 MG 24 hr tablet Commonly known as: TOPROL-XL TAKE 1 TABLET BY MOUTH DAILY. CAN TAKE ADDITIONAL HALF TABLET IF HEART RATE IS OVER 100 What changed: See the new instructions.   rosuvastatin 40 MG tablet Commonly known as: CRESTOR Take 1 tablet (40 mg total) by mouth daily.        Allergies:  Allergies  Allergen Reactions   Mango Flavor Swelling and Other (See Comments)    LIPS SWELL   Rituximab Other (See Comments)    Blood pressure went extremely low.    Shellfish Allergy Anaphylaxis   Lactose Intolerance  (Gi) Diarrhea    Past Medical History, Surgical history, Social history, and Family History were reviewed and updated.  Review of Systems: Review of Systems  Constitutional: Negative.   HENT: Negative.    Eyes: Negative.   Respiratory: Negative.    Cardiovascular: Negative.   Gastrointestinal: Negative.   Genitourinary: Negative.   Musculoskeletal: Negative.   Skin: Negative.   Neurological: Negative.   Endo/Heme/Allergies: Negative.   Psychiatric/Behavioral: Negative.       Physical Exam:  height is 5\' 11"  (1.803 m) and weight is 269 lb (122 kg). His oral temperature is 97.8 F (36.6 C). His blood pressure is 118/73 and his pulse is 70. His respiration is 17 and oxygen saturation is 98%.   Wt Readings from Last 3 Encounters:  09/28/22 269 lb (122 kg)  09/22/22 270 lb (122.5 kg)  07/27/22 264 lb (119.7 kg)  His vital signs are temperature of 98.4.  Pulse 105.  Blood pressure 99/60.  Weight is 269 pounds.    Physical Exam Vitals reviewed.  HENT:     Head: Normocephalic and atraumatic.  Eyes:     Pupils: Pupils are equal, round, and reactive to light.  Cardiovascular:     Rate and Rhythm: Normal rate and regular rhythm.     Heart sounds: Normal heart sounds.  Pulmonary:     Effort: Pulmonary effort is normal.     Breath sounds: Normal breath sounds.  Abdominal:     General: Bowel sounds are normal.     Palpations: Abdomen is soft.  Musculoskeletal:        General: No tenderness or deformity. Normal range of motion.     Cervical back: Normal range of motion.  Lymphadenopathy:     Cervical: No cervical adenopathy.  Skin:    General: Skin is warm and dry.     Findings: No erythema or rash.  Neurological:     Mental Status: He is alert and oriented to person, place, and time.  Psychiatric:        Behavior: Behavior normal.        Thought Content: Thought content normal.        Judgment: Judgment normal.      Lab Results  Component Value Date   WBC 5.8  09/28/2022   HGB 13.8 09/28/2022   HCT 41.6 09/28/2022   MCV 89.8 09/28/2022   PLT 213 09/28/2022   No results found for: "FERRITIN", "IRON", "TIBC", "UIBC", "IRONPCTSAT" Lab Results  Component Value Date   RETICCTPCT 1.07 12/06/2015   RBC 4.63 09/28/2022   RETICCTABS 48.79 12/06/2015   Lab Results  Component Value Date   KPAFRELGTCHN 14.7 07/27/2022   LAMBDASER  13.2 07/27/2022   KAPLAMBRATIO 1.11 07/27/2022   Lab Results  Component Value Date   IGGSERUM 599 (L) 07/27/2022   IGA 116 07/27/2022   IGMSERUM 12 (L) 07/27/2022   Lab Results  Component Value Date   TOTALPROTELP 5.7 (L) 06/15/2022   ALBUMINELP 3.1 06/15/2022   A1GS 0.3 06/15/2022   A2GS 0.9 06/15/2022   BETS 1.0 06/15/2022   GAMS 0.4 06/15/2022   MSPIKE Not Observed 06/15/2022     Chemistry      Component Value Date/Time   NA 140 09/28/2022 1025   NA 143 02/04/2022 1506   NA 141 12/03/2016 1155   NA 141 02/07/2016 1149   K 4.0 09/28/2022 1025   K 3.8 12/03/2016 1155   K 4.3 02/07/2016 1149   CL 104 09/28/2022 1025   CL 100 12/03/2016 1155   CO2 29 09/28/2022 1025   CO2 27 12/03/2016 1155   CO2 27 02/07/2016 1149   BUN 10 09/28/2022 1025   BUN 11 02/04/2022 1506   BUN 10 12/03/2016 1155   BUN 10.9 02/07/2016 1149   CREATININE 1.01 09/28/2022 1025   CREATININE 1.0 12/03/2016 1155   CREATININE 0.9 02/07/2016 1149      Component Value Date/Time   CALCIUM 9.0 09/28/2022 1025   CALCIUM 9.1 12/03/2016 1155   CALCIUM 9.6 02/07/2016 1149   ALKPHOS 111 09/28/2022 1025   ALKPHOS 99 (H) 12/03/2016 1155   ALKPHOS 127 02/07/2016 1149   AST 14 (L) 09/28/2022 1025   AST 17 02/07/2016 1149   ALT 15 09/28/2022 1025   ALT 24 12/03/2016 1155   ALT 16 02/07/2016 1149   BILITOT 0.5 09/28/2022 1025   BILITOT 0.42 02/07/2016 1149       Impression and Plan: Mr. Wadler is a pleasant 52 yo caucasian gentleman with history of follicular large cell non-Hodgkin's lymphoma. He completed 6 cycles of chemotherapy  with bendamustine on 07/03/2016 but was not tolerant of Rituxan (anaphylaxis). His lymphoma has  recurred.  We then treated him with CHOP.  This got him into remission.  He has completed all of his treatment for the recurrent follicular large cell lymphoma.  I think that this does come back again, he will probably be a good candidate for CAR-T therapy.  Will be interesting to see what the upper endoscopy shows.  We will plan to get him back in another couple months.  Again, we will have to do another PET scan on him before the end of the year.    Josph Macho, MD 8/26/202411:01 AM

## 2022-09-28 NOTE — Telephone Encounter (Signed)
Returned call to Cordelia Pen- informed her that labs could be sent to [facility].

## 2022-09-28 NOTE — Telephone Encounter (Signed)
Please call Sherry at [phone]

## 2022-09-28 NOTE — Telephone Encounter (Signed)
Sherry at [employer] [facility] is calling about outside lab work and wants to know should it be sent to [facility] or [facility]. Please advise.

## 2022-09-29 LAB — IGG, IGA, IGM
IgA: 115 mg/dL (ref 90–386)
IgG (Immunoglobin G), Serum: 578 mg/dL — ABNORMAL LOW (ref 603–1613)
IgM (Immunoglobulin M), Srm: 12 mg/dL — ABNORMAL LOW (ref 20–172)

## 2022-09-29 LAB — FERRITIN: Ferritin: 55 ng/mL (ref 30–400)

## 2022-09-29 LAB — TISSUE TRANSGLUTAMINASE, IGA: (tTG) Ab, IgA: <1 U/mL

## 2022-09-29 LAB — IRON AND TIBC
Iron Saturation: 24 % (ref 15–55)
Iron: 84 ug/dL (ref 38–169)
Total Iron Binding Capacity: 347 ug/dL (ref 250–450)
UIBC: 263 ug/dL (ref 111–343)

## 2022-09-29 LAB — IGA: Immunoglobulin A: 118 mg/dL (ref 47–310)

## 2022-10-07 ENCOUNTER — Other Ambulatory Visit: Payer: BC Managed Care – PPO

## 2022-10-07 DIAGNOSIS — R195 Other fecal abnormalities: Secondary | ICD-10-CM

## 2022-10-07 DIAGNOSIS — K649 Unspecified hemorrhoids: Secondary | ICD-10-CM

## 2022-10-07 DIAGNOSIS — K921 Melena: Secondary | ICD-10-CM | POA: Diagnosis not present

## 2022-10-07 DIAGNOSIS — R194 Change in bowel habit: Secondary | ICD-10-CM

## 2022-10-15 LAB — PANCREATIC ELASTASE, FECAL: Pancreatic Elastase-1, Stool: >500 ug/g

## 2022-10-22 DIAGNOSIS — H93292 Other abnormal auditory perceptions, left ear: Secondary | ICD-10-CM | POA: Diagnosis not present

## 2022-10-22 DIAGNOSIS — H6993 Unspecified Eustachian tube disorder, bilateral: Secondary | ICD-10-CM | POA: Diagnosis not present

## 2022-10-23 ENCOUNTER — Telehealth: Payer: Self-pay | Admitting: Gastroenterology

## 2022-10-23 NOTE — Telephone Encounter (Signed)
CMA returned call phone Dr Eloise Harman office. Gave updated fax number for request to be sent to.

## 2022-10-23 NOTE — Telephone Encounter (Signed)
Request re-faxed to update fax number given.

## 2022-10-23 NOTE — Telephone Encounter (Addendum)
Left voicemail on Dr Eloise Harman [facility] phone asking for update on clearance request for Plavix. Request has been faxed x2.

## 2022-10-23 NOTE — Telephone Encounter (Signed)
Dr. Norval Gable office is calling about the surgical clearance that they did not receive it. Please resend to [phone] attn: Dr. Jarold Motto.

## 2022-10-27 NOTE — Telephone Encounter (Signed)
Recd fax back from [facility] - per Dr Eloise Harman okay for patient to hold Plavix for 5 days prior to procedure. Left detailed message for patient.

## 2022-10-28 DIAGNOSIS — H90A22 Sensorineural hearing loss, unilateral, left ear, with restricted hearing on the contralateral side: Secondary | ICD-10-CM | POA: Diagnosis not present

## 2022-10-28 DIAGNOSIS — H9312 Tinnitus, left ear: Secondary | ICD-10-CM | POA: Diagnosis not present

## 2022-11-02 NOTE — Telephone Encounter (Signed)
Left another detailed message for patient. I will also send my chart message to patient.

## 2022-11-11 ENCOUNTER — Other Ambulatory Visit: Payer: Self-pay | Admitting: Physical Medicine and Rehabilitation

## 2022-11-11 DIAGNOSIS — H90A22 Sensorineural hearing loss, unilateral, left ear, with restricted hearing on the contralateral side: Secondary | ICD-10-CM

## 2022-11-12 DIAGNOSIS — R140 Abdominal distension (gaseous): Secondary | ICD-10-CM | POA: Diagnosis not present

## 2022-11-20 ENCOUNTER — Other Ambulatory Visit: Payer: Self-pay

## 2022-11-20 ENCOUNTER — Encounter: Payer: Self-pay | Admitting: Gastroenterology

## 2022-11-20 MED ORDER — METRONIDAZOLE 250 MG PO TABS: 500.0000 mg | ORAL_TABLET | Freq: Three times a day (TID) | ORAL | 0 refills | Status: AC

## 2022-11-20 NOTE — Progress Notes (Signed)
[facility] SIBO breath testing  Hydrogen 43 ppm Methane 17 ppm Combined hydrogen/methane 60 ppm Analysis of the data suggest bacterial overgrowth is suspected  Recommend patient initiate Xifaxan 550 mg 3 times daily x 14 days.  If this is not financially feasible then we will consider antibiotics such as Augmentin or Flagyl which have less efficacy in the data  Will forward this to my team and will have it scanned into the chart and mailed to the patient has a copy for his records and for my team to offer him antibiotic therapy.  Corliss Parish, MD [facility] Office # [phone]

## 2022-11-20 NOTE — Progress Notes (Signed)
The pt has been advised of the results.  Prescription has been sent to the pharmacy for flagyl. He has been advised to avoid ETOH while taking.

## 2022-11-20 NOTE — Progress Notes (Signed)
Left message on machine to call back  

## 2022-11-24 ENCOUNTER — Ambulatory Visit (AMBULATORY_SURGERY_CENTER): Payer: BC Managed Care – PPO | Admitting: Gastroenterology

## 2022-11-24 ENCOUNTER — Encounter: Payer: Self-pay | Admitting: Gastroenterology

## 2022-11-24 VITALS — BP 90/51 | HR 70 | Temp 97.8°F | Resp 10 | Ht 71.75 in | Wt 270.0 lb

## 2022-11-24 DIAGNOSIS — K2950 Unspecified chronic gastritis without bleeding: Secondary | ICD-10-CM | POA: Diagnosis not present

## 2022-11-24 DIAGNOSIS — R195 Other fecal abnormalities: Secondary | ICD-10-CM

## 2022-11-24 DIAGNOSIS — K2970 Gastritis, unspecified, without bleeding: Secondary | ICD-10-CM | POA: Diagnosis not present

## 2022-11-24 MED ORDER — SODIUM CHLORIDE 0.9 % IV SOLN: 500.0000 mL | Freq: Once | INTRAVENOUS | Status: DC

## 2022-11-24 NOTE — Progress Notes (Signed)
Called to room to assist during endoscopic procedure.  Patient ID and intended procedure confirmed with present staff. Received instructions for my participation in the procedure from the performing physician.  

## 2022-11-24 NOTE — Progress Notes (Signed)
GASTROENTEROLOGY PROCEDURE H&P NOTE   Primary Care Physician: Garlan Fillers, MD  HPI: James Jennings is a 52 y.o. male who presents for EGD for further evaluation of positive FOBT and bloating.  Past Medical History:  Diagnosis Date   Anxiety    Bilateral swelling of feet    Diabetes mellitus without complication ([name])    High cholesterol    Hypogammaglobulinemia ([name]) 06/15/2022   Joint pain    Lactose intolerance    Large cell, follicular non-Hodgkin's lymphoma ([name])    Lymphoma, follicular ([name]) dx'd 01/2016   Multiple food allergies    Other fatigue    Palpitations    PONV (postoperative nausea and vomiting)    Pre-diabetes    Prediabetes    Shortness of breath on exertion    Sleep apnea    "dx'd ~ 2008; never RX'd mask" (02/28/2016)   Past Surgical History:  Procedure Laterality Date   ACHILLES TENDON SURGERY Left ~ 2012   APPENDECTOMY  11/12/2015   lap appy   BUBBLE STUDY  12/04/2021   Procedure: BUBBLE STUDY;  Surgeon: Jodelle Red, MD;  Location: [facility] ENDOSCOPY;  Service: Cardiovascular;;   CLUB FOOT RELEASE Bilateral 1971   HERNIA REPAIR     INGUINAL LYMPH NODE BIOPSY Right 01/20/2016   Procedure: EXCISIONAL BIOPSY OF RIGHT INGUINAL LYMPH NODE;  Surgeon: Gaynelle Adu, MD;  Location: [facility] OR;  Service: General;  Laterality: Right;   IR IMAGING GUIDED PORT INSERTION  11/02/2019   LAPAROSCOPIC APPENDECTOMY N/A 11/12/2015   Procedure: APPENDECTOMY LAPAROSCOPIC;  Surgeon: Gaynelle Adu, MD;  Location: [facility] OR;  Service: General;  Laterality: N/A;   LYMPH NODE BIOPSY Left 03/20/2020   Procedure: EXCISIONAL BIOPSY LEFT INGUINAL LYMPH NODE;  Surgeon: Abigail Miyamoto, MD;  Location: [facility] OR;  Service: General;  Laterality: Left;  LMA   PATENT FORAMEN OVALE(PFO) CLOSURE N/A 03/06/2022   Procedure: PATENT FORAMEN OVALE(PFO) CLOSURE;  Surgeon: Orbie Pyo, MD;  Location: [facility] INVASIVE CV LAB;  Service: Cardiovascular;  Laterality: N/A;   SUTURE REMOVAL Left ~ 2016-2017 X  3   "had to use permanent sutures w/my achilles OR; my body rejects them at times & I have to have them surgically removed; under anesthesia"   TEE WITHOUT CARDIOVERSION N/A 12/04/2021   Procedure: TRANSESOPHAGEAL ECHOCARDIOGRAM (TEE);  Surgeon: Jodelle Red, MD;  Location: [facility] ENDOSCOPY;  Service: Cardiovascular;  Laterality: N/A;   VASECTOMY Bilateral 07/2019   VENTRAL HERNIA REPAIR  1994   Current Outpatient Medications  Medication Sig Dispense Refill   acetaminophen (TYLENOL) 325 MG tablet Take 650 mg by mouth daily as needed for moderate pain or headache.     ALPRAZolam (XANAX) 0.25 MG tablet Take 0.25 mg by mouth 2 (two) times daily as needed for anxiety.     amoxicillin-clavulanate (AUGMENTIN) 875-125 MG tablet 1 tablet Orally every 12 hrs for 10 days     aspirin EC 81 MG tablet Take 1 tablet (81 mg total) by mouth daily. Swallow whole. 30 tablet 12   cetirizine (ZYRTEC) 10 MG tablet Take 10 mg by mouth daily.     clopidogrel (PLAVIX) 75 MG tablet Take 1 tablet (75 mg total) by mouth daily. 30 tablet 11   escitalopram (LEXAPRO) 5 MG tablet Take 5 mg by mouth daily.     FARXIGA 10 MG TABS tablet Take 10 mg by mouth daily.     hydrocortisone (ANUSOL-HC) 25 MG suppository Place 1 suppository (25 mg total) rectally every 12 (twelve) hours. (Patient not taking:  Reported on 09/28/2022) 12 suppository 0   ipratropium (ATROVENT) 0.03 % nasal spray Place 2 sprays into both nostrils 2 (two) times daily.     lidocaine-prilocaine (EMLA) cream Apply 1 Application topically as needed. Apply to [facility] 1 hour prior to procedure.     metFORMIN (GLUCOPHAGE) 500 MG tablet Take 500 mg by mouth daily.     metoprolol succinate (TOPROL-XL) 25 MG 24 hr tablet TAKE 1 TABLET BY MOUTH DAILY. CAN TAKE ADDITIONAL HALF TABLET IF HEART RATE IS OVER 100 (Patient taking differently: Take 25 mg by mouth daily.) 135 tablet 3   metroNIDAZOLE (FLAGYL) 250 MG tablet Take 2 tablets (500 mg total) by mouth 3 (three) times  daily for 14 days. 84 tablet 0   rosuvastatin (CRESTOR) 40 MG tablet Take 1 tablet (40 mg total) by mouth daily. 90 tablet 3   No current facility-administered medications for this visit.    Current Outpatient Medications:    acetaminophen (TYLENOL) 325 MG tablet, Take 650 mg by mouth daily as needed for moderate pain or headache., Disp: , Rfl:    ALPRAZolam (XANAX) 0.25 MG tablet, Take 0.25 mg by mouth 2 (two) times daily as needed for anxiety., Disp: , Rfl:    amoxicillin-clavulanate (AUGMENTIN) 875-125 MG tablet, 1 tablet Orally every 12 hrs for 10 days, Disp: , Rfl:    aspirin EC 81 MG tablet, Take 1 tablet (81 mg total) by mouth daily. Swallow whole., Disp: 30 tablet, Rfl: 12   cetirizine (ZYRTEC) 10 MG tablet, Take 10 mg by mouth daily., Disp: , Rfl:    clopidogrel (PLAVIX) 75 MG tablet, Take 1 tablet (75 mg total) by mouth daily., Disp: 30 tablet, Rfl: 11   escitalopram (LEXAPRO) 5 MG tablet, Take 5 mg by mouth daily., Disp: , Rfl:    FARXIGA 10 MG TABS tablet, Take 10 mg by mouth daily., Disp: , Rfl:    hydrocortisone (ANUSOL-HC) 25 MG suppository, Place 1 suppository (25 mg total) rectally every 12 (twelve) hours. (Patient not taking: Reported on 09/28/2022), Disp: 12 suppository, Rfl: 0   ipratropium (ATROVENT) 0.03 % nasal spray, Place 2 sprays into both nostrils 2 (two) times daily., Disp: , Rfl:    lidocaine-prilocaine (EMLA) cream, Apply 1 Application topically as needed. Apply to [facility] 1 hour prior to procedure., Disp: , Rfl:    metFORMIN (GLUCOPHAGE) 500 MG tablet, Take 500 mg by mouth daily., Disp: , Rfl:    metoprolol succinate (TOPROL-XL) 25 MG 24 hr tablet, TAKE 1 TABLET BY MOUTH DAILY. CAN TAKE ADDITIONAL HALF TABLET IF HEART RATE IS OVER 100 (Patient taking differently: Take 25 mg by mouth daily.), Disp: 135 tablet, Rfl: 3   metroNIDAZOLE (FLAGYL) 250 MG tablet, Take 2 tablets (500 mg total) by mouth 3 (three) times daily for 14 days., Disp: 84 tablet, Rfl: 0   rosuvastatin  (CRESTOR) 40 MG tablet, Take 1 tablet (40 mg total) by mouth daily., Disp: 90 tablet, Rfl: 3 Allergies  Allergen Reactions   Mango Flavor Swelling and Other (See Comments)    LIPS SWELL   Rituximab Other (See Comments)    Blood pressure went extremely low.    Shellfish Allergy Anaphylaxis   Lactose Intolerance (Gi) Diarrhea   Family History  Problem Relation Age of Onset   Colon cancer Neg Hx    Colon polyps Neg Hx    Esophageal cancer Neg Hx    Stomach cancer Neg Hx    Rectal cancer Neg Hx    Inflammatory bowel disease  Neg Hx    Liver disease Neg Hx    Pancreatic cancer Neg Hx    Social History   Socioeconomic History   Marital status: Married    Spouse name: Karlis Courtenay   Number of children: 1   Years of education: Not on file   Highest education level: Not on file  Occupational History   Occupation: auditor  Tobacco Use   Smoking status: Former    Current packs/day: 0.00    Average packs/day: 1 pack/day for 30.0 years (30.0 ttl pk-yrs)    Types: Cigarettes    Start date: 05/06/1987    Quit date: 05/05/2017    Years since quitting: 5.5   Smokeless tobacco: Former    Types: Snuff    Quit date: 1997   Tobacco comments:    11/12/2015 "quit using chew in ~ 1997"  Vaping Use   Vaping status: Never Used  Substance and Sexual Activity   Alcohol use: Not Currently   Drug use: Not Currently    Types: Marijuana    Comment: "in the early 1990s; recreational"   Sexual activity: Yes    Partners: Female  Other Topics Concern   Not on file  Social History Narrative   Not on file   Social Determinants of Health   Financial Resource Strain: Not on file  Food Insecurity: No Food Insecurity (06/03/2022)   Hunger Vital Sign    Worried About Running Out of Food in the Last Year: Never true    Ran Out of Food in the Last Year: Never true  Transportation Needs: No Transportation Needs (06/03/2022)   PRAPARE - Administrator, Civil Service (Medical): No    Lack of  Transportation (Non-Medical): No  Physical Activity: Not on file  Stress: Not on file  Social Connections: Not on file  Intimate Partner Violence: Not At Risk (06/03/2022)   Humiliation, Afraid, Rape, and Kick questionnaire    Fear of Current or Ex-Partner: No    Emotionally Abused: No    Physically Abused: No    Sexually Abused: No    Physical Exam: Today's Vitals   11/24/22 0917  BP: 125/87  Pulse: (!) 58  Temp: 97.8 F (36.6 C)  TempSrc: Skin  SpO2: 96%  Weight: 270 lb (122.5 kg)  Height: 5' 11.75" (1.822 m)   Body mass index is 36.87 kg/m. GEN: NAD EYE: Sclerae anicteric ENT: MMM CV: Non-tachycardic GI: Soft, NT/ND NEURO:  Alert & Oriented x 3  Lab Results: No results for input(s): "WBC", "HGB", "HCT", "PLT" in the last 72 hours. BMET No results for input(s): "NA", "K", "CL", "CO2", "GLUCOSE", "BUN", "CREATININE", "CALCIUM" in the last 72 hours. LFT No results for input(s): "PROT", "ALBUMIN", "AST", "ALT", "ALKPHOS", "BILITOT", "BILIDIR", "IBILI" in the last 72 hours. PT/INR No results for input(s): "LABPROT", "INR" in the last 72 hours.   Impression / Plan: This is a 52 y.o.male who presents for EGD for further evaluation of positive FOBT and bloating.  The risks and benefits of endoscopic evaluation/treatment were discussed with the patient and/or family; these include but are not limited to the risk of perforation, infection, bleeding, missed lesions, lack of diagnosis, severe illness requiring hospitalization, as well as anesthesia and sedation related illnesses.  The patient's history has been reviewed, patient examined, no change in status, and deemed stable for procedure.  The patient and/or family is agreeable to proceed.    Corliss Parish, MD [facility] Office # [phone]

## 2022-11-24 NOTE — Progress Notes (Signed)
Vss nad trans to pacu 

## 2022-11-24 NOTE — Patient Instructions (Signed)
RESUME PREVIOUS DIET AND MEDICATIONS. START OMEPRAZOLE 20 MG DAILY. AWAITING PATHOLOGY RESULTS. MAY RESTART PLAVIX ON 10/23.  YOU HAD AN ENDOSCOPIC PROCEDURE TODAY AT THE [facility]:   Refer to the procedure report that was given to you for any specific questions about what was found during the examination.  If the procedure report does not answer your questions, please call your gastroenterologist to clarify.  If you requested that your care partner not be given the details of your procedure findings, then the procedure report has been included in a sealed envelope for you to review at your convenience later.  YOU SHOULD EXPECT: Some feelings of bloating in the abdomen. Passage of more gas than usual.  Walking can help get rid of the air that was put into your GI tract during the procedure and reduce the bloating. If you had a lower endoscopy (such as a colonoscopy or flexible sigmoidoscopy) you may notice spotting of blood in your stool or on the toilet paper. If you underwent a bowel prep for your procedure, you may not have a normal bowel movement for a few days.  Please Note:  You might notice some irritation and congestion in your nose or some drainage.  This is from the oxygen used during your procedure.  There is no need for concern and it should clear up in a day or so.  SYMPTOMS TO REPORT IMMEDIATELY:  Following upper endoscopy (EGD)  Vomiting of blood or coffee ground material  New chest pain or pain under the shoulder blades  Painful or persistently difficult swallowing  New shortness of breath  Fever of 100F or higher  Black, tarry-looking stools  For urgent or emergent issues, a gastroenterologist can be reached at any hour by calling [phone]. Do not use MyChart messaging for urgent concerns.    DIET:  We do recommend a small meal at first, but then you may proceed to your regular diet.  Drink plenty of fluids but you should avoid alcoholic beverages for 24  hours.  ACTIVITY:  You should plan to take it easy for the rest of today and you should NOT DRIVE or use heavy machinery until tomorrow (because of the sedation medicines used during the test).    FOLLOW UP: Our staff will call the number listed on your records the next business day following your procedure.  We will call around 7:15- 8:00 am to check on you and address any questions or concerns that you may have regarding the information given to you following your procedure. If we do not reach you, we will leave a message.     If any biopsies were taken you will be contacted by phone or by letter within the next 1-3 weeks.  Please call us at [phone] if you have not heard about the biopsies in 3 weeks.    SIGNATURES/CONFIDENTIALITY: You and/or your care partner have signed paperwork which will be entered into your electronic medical record.  These signatures attest to the fact that that the information above on your After Visit Summary has been reviewed and is understood.  Full responsibility of the confidentiality of this discharge information lies with you and/or your care-partner.

## 2022-11-24 NOTE — Op Note (Signed)
[facility] Patient Name: James Jennings Procedure Date: 11/24/2022 9:37 AM MRN: [id number] Endoscopist: Corliss Parish , MD, [id number] Age: 52 Referring MD:  Date of Birth: 1970-10-16 Gender: Male Account #: [id number] Procedure:                Upper GI endoscopy Indications:              Heme positive stool, Abdominal bloating Medicines:                Monitored Anesthesia Care Procedure:                Pre-Anesthesia Assessment:                           - Prior to the procedure, a History and Physical                            was performed, and patient medications and                            allergies were reviewed. The patient's tolerance of                            previous anesthesia was also reviewed. The risks                            and benefits of the procedure and the sedation                            options and risks were discussed with the patient.                            All questions were answered, and informed consent                            was obtained. Prior Anticoagulants: The patient has                            taken Plavix (clopidogrel), last dose was 5 days                            prior to procedure. ASA Grade Assessment: III - A                            patient with severe systemic disease. After                            reviewing the risks and benefits, the patient was                            deemed in satisfactory condition to undergo the                            procedure.  After obtaining informed consent, the endoscope was                            passed under direct vision. Throughout the                            procedure, the patient's blood pressure, pulse, and                            oxygen saturations were monitored continuously. The                            GIF [id number] #[id number] was introduced through the                            mouth, and advanced to the second part of  duodenum.                            The upper GI endoscopy was accomplished without                            difficulty. The patient tolerated the procedure. Scope In: Scope Out: Findings:                 No gross lesions were noted in the entire esophagus.                           The Z-line was irregular and was found 40 cm from                            the incisors.                           Patchy mild inflammation characterized by erosions                            and erythema was found in the gastric antrum.                           No other gross lesions were noted in the entire                            examined stomach. Biopsies were taken with a cold                            forceps for histology and Helicobacter pylori                            testing.                           No gross lesions were noted in the duodenal bulb,  in the first portion of the duodenum and in the                            second portion of the duodenum. Biopsies were taken                            with a cold forceps for histology. Complications:            No immediate complications. Estimated Blood Loss:     Estimated blood loss was minimal. Impression:               - No gross lesions in the entire esophagus. Z-line                            irregular, 40 cm from the incisors.                           - Antral gastritis. No other gross lesions in the                            entire stomach. Biopsied.                           - No gross lesions in the duodenal bulb, in the                            first portion of the duodenum and in the second                            portion of the duodenum. Biopsied. Recommendation:           - The patient will be observed post-procedure,                            until all discharge criteria are met.                           - Discharge patient to home.                           - Patient has a contact  number available for                            emergencies. The signs and symptoms of potential                            delayed complications were discussed with the                            patient. Return to normal activities tomorrow.                            Written discharge instructions were provided to the  patient.                           - Resume previous diet.                           - Omeprazole 20 mg daily to be initiated.                           - May restart Plavix on 10/23.                           - Continue present medications otherwise.                           - Await pathology results.                           - The findings and recommendations were discussed                            with the patient.                           - The findings and recommendations were discussed                            with the patient's family. Corliss Parish, MD 11/24/2022 9:59:43 AM

## 2022-11-25 ENCOUNTER — Telehealth: Payer: Self-pay

## 2022-11-25 DIAGNOSIS — H90A22 Sensorineural hearing loss, unilateral, left ear, with restricted hearing on the contralateral side: Secondary | ICD-10-CM | POA: Diagnosis not present

## 2022-11-25 DIAGNOSIS — H9122 Sudden idiopathic hearing loss, left ear: Secondary | ICD-10-CM | POA: Diagnosis not present

## 2022-11-25 DIAGNOSIS — H6993 Unspecified Eustachian tube disorder, bilateral: Secondary | ICD-10-CM | POA: Diagnosis not present

## 2022-11-25 DIAGNOSIS — H9312 Tinnitus, left ear: Secondary | ICD-10-CM | POA: Diagnosis not present

## 2022-11-25 NOTE — Telephone Encounter (Signed)
  Follow up Call-     11/24/2022    9:25 AM 04/01/2021    7:23 AM  Call back number  Post procedure Call Back phone  # [phone]  Permission to leave phone message Yes Yes     Patient questions:  Do you have a fever, pain , or abdominal swelling? No. Pain Score  0 *  Have you tolerated food without any problems? Yes.    Have you been able to return to your normal activities? Yes.    Do you have any questions about your discharge instructions: Diet   No. Medications  No. Follow up visit  No.  Do you have questions or concerns about your Care? No.  Actions: * If pain score is 4 or above: No action needed, pain <4.

## 2022-11-26 LAB — SURGICAL PATHOLOGY

## 2022-11-27 ENCOUNTER — Encounter: Payer: Self-pay | Admitting: Gastroenterology

## 2022-11-30 ENCOUNTER — Encounter: Payer: Self-pay | Admitting: Hematology & Oncology

## 2022-11-30 ENCOUNTER — Inpatient Hospital Stay (HOSPITAL_BASED_OUTPATIENT_CLINIC_OR_DEPARTMENT_OTHER): Payer: BC Managed Care – PPO | Admitting: Hematology & Oncology

## 2022-11-30 ENCOUNTER — Inpatient Hospital Stay: Payer: BC Managed Care – PPO | Attending: Hematology & Oncology

## 2022-11-30 ENCOUNTER — Other Ambulatory Visit: Payer: Self-pay

## 2022-11-30 ENCOUNTER — Inpatient Hospital Stay: Payer: BC Managed Care – PPO

## 2022-11-30 ENCOUNTER — Other Ambulatory Visit (HOSPITAL_BASED_OUTPATIENT_CLINIC_OR_DEPARTMENT_OTHER): Payer: Self-pay

## 2022-11-30 VITALS — BP 111/69 | HR 82 | Temp 97.8°F | Resp 16 | Ht 71.75 in | Wt 276.0 lb

## 2022-11-30 DIAGNOSIS — E119 Type 2 diabetes mellitus without complications: Secondary | ICD-10-CM | POA: Diagnosis not present

## 2022-11-30 DIAGNOSIS — Z7902 Long term (current) use of antithrombotics/antiplatelets: Secondary | ICD-10-CM | POA: Diagnosis not present

## 2022-11-30 DIAGNOSIS — Z7984 Long term (current) use of oral hypoglycemic drugs: Secondary | ICD-10-CM | POA: Diagnosis not present

## 2022-11-30 DIAGNOSIS — C8223 Follicular lymphoma grade III, unspecified, intra-abdominal lymph nodes: Secondary | ICD-10-CM | POA: Insufficient documentation

## 2022-11-30 DIAGNOSIS — Z8489 Family history of other specified conditions: Secondary | ICD-10-CM | POA: Diagnosis not present

## 2022-11-30 DIAGNOSIS — D801 Nonfamilial hypogammaglobulinemia: Secondary | ICD-10-CM | POA: Insufficient documentation

## 2022-11-30 DIAGNOSIS — Z95828 Presence of other vascular implants and grafts: Secondary | ICD-10-CM

## 2022-11-30 DIAGNOSIS — Z8673 Personal history of transient ischemic attack (TIA), and cerebral infarction without residual deficits: Secondary | ICD-10-CM | POA: Diagnosis not present

## 2022-11-30 LAB — CMP (CANCER CENTER ONLY)
ALT: 17 U/L (ref 0–44)
AST: 12 U/L — ABNORMAL LOW (ref 15–41)
Albumin: 4.2 g/dL (ref 3.5–5.0)
Alkaline Phosphatase: 93 U/L (ref 38–126)
Anion gap: 7 (ref 5–15)
BUN: 17 mg/dL (ref 6–20)
CO2: 29 mmol/L (ref 22–32)
Calcium: 8.7 mg/dL — ABNORMAL LOW (ref 8.9–10.3)
Chloride: 102 mmol/L (ref 98–111)
Creatinine: 1.17 mg/dL (ref 0.61–1.24)
GFR, Estimated: >60 mL/min (ref >60)
Glucose, Bld: 350 mg/dL — ABNORMAL HIGH (ref 70–99)
Potassium: 4.2 mmol/L (ref 3.5–5.1)
Sodium: 138 mmol/L (ref 135–145)
Total Bilirubin: 0.4 mg/dL (ref 0.3–1.2)
Total Protein: 5.6 g/dL — ABNORMAL LOW (ref 6.5–8.1)

## 2022-11-30 LAB — CBC WITH DIFFERENTIAL (CANCER CENTER ONLY)
Abs Immature Granulocytes: 0.04 10*3/uL (ref 0.00–0.07)
Basophils Absolute: 0 10*3/uL (ref 0.0–0.1)
Basophils Relative: 1 %
Eosinophils Absolute: 0 10*3/uL (ref 0.0–0.5)
Eosinophils Relative: 0 %
HCT: 42.3 % (ref 39.0–52.0)
Hemoglobin: 14.1 g/dL (ref 13.0–17.0)
Immature Granulocytes: 1 %
Lymphocytes Relative: 7 %
Lymphs Abs: 0.6 10*3/uL — ABNORMAL LOW (ref 0.7–4.0)
MCH: 30.3 pg (ref 26.0–34.0)
MCHC: 33.3 g/dL (ref 30.0–36.0)
MCV: 91 fL (ref 80.0–100.0)
Monocytes Absolute: 0.3 10*3/uL (ref 0.1–1.0)
Monocytes Relative: 4 %
Neutro Abs: 6.8 10*3/uL (ref 1.7–7.7)
Neutrophils Relative %: 87 %
Platelet Count: 211 10*3/uL (ref 150–400)
RBC: 4.65 MIL/uL (ref 4.22–5.81)
RDW: 13.6 % (ref 11.5–15.5)
WBC Count: 7.8 10*3/uL (ref 4.0–10.5)
nRBC: 0 % (ref 0.0–0.2)

## 2022-11-30 LAB — LACTATE DEHYDROGENASE: LDH: 127 U/L (ref 98–192)

## 2022-11-30 MED ORDER — HEPARIN SOD (PORK) LOCK FLUSH 100 UNIT/ML IV SOLN
500.0000 [IU] | Freq: Once | INTRAVENOUS | Status: AC
  Administered 2022-11-30: 500 [IU] via INTRAVENOUS

## 2022-11-30 MED ORDER — SODIUM CHLORIDE 0.9% FLUSH
10.0000 mL | Freq: Once | INTRAVENOUS | Status: AC
  Administered 2022-11-30: 10 mL via INTRAVENOUS

## 2022-11-30 MED ORDER — INSULIN ASPART 100 UNIT/ML IJ SOLN
15.0000 [IU] | Freq: Once | INTRAMUSCULAR | Status: DC
Start: 2022-11-30 — End: 2022-11-30
  Filled 2022-11-30: qty 0.15

## 2022-11-30 MED ORDER — INSULIN ASPART 100 UNIT/ML IJ SOLN
15.0000 [IU] | Freq: Once | INTRAMUSCULAR | Status: AC
  Administered 2022-11-30: 15 [IU] via SUBCUTANEOUS
  Filled 2022-11-30: qty 0.15

## 2022-11-30 MED ORDER — FARXIGA 10 MG PO TABS
10.0000 mg | ORAL_TABLET | Freq: Every day | ORAL | 3 refills | Status: AC
  Filled 2022-11-30: qty 30, 30d supply, fill #0
  Filled 2022-12-25: qty 30, 30d supply, fill #1

## 2022-11-30 NOTE — Progress Notes (Signed)
Hematology and Oncology Follow Up Visit  James Jennings [id number] 04-17-1970 52 y.o. 11/30/2022   Principle Diagnosis:  Follicular B- cell non-Hodgkin's lymphoma - Relapsed Hypogammaglobulinemia   Past Therapy:             Rituxan/bendamustine-s/p cycle 6 - completed on 07/03/2016   Current Therapy:  Gazyva  - Cytoxan/ Vincristine/Prednisone  - started 11/07/2019, s/p cycle #4 --  D/c on 03/26/2020 G-CHOP -- s/p cycle #6-- started on 04/03/2020 -completed on 07/13/2020 Maintenance Gazyva-s/p cycle 10/12 --  start on 09/05/2020 --completed on 07/27/2022    Interim History:  James Jennings is here today for follow-up.  Unfortunately, has been having some difficulties.  He has been a lot of problems with his left ear.  He has been seeing ENT.  He apparently has had issues and has had infection.  He has lost some of the hearing in his left ear.  He has been on a Medrol Dosepak.  Because this, his blood sugars are skyhigh.  Today, his blood sugar is 350.  We will go ahead and give him some insulin.  I will give him 15 units.  He was on [place].  He says he has not had this for 4 days.  He says his pharmacy cannot get this in for him.  We will see about having him take this and get it downstairs in our pharmacy.  He has had weight gain.  I know this is not a good thing for him.  He is still on Plavix.  He has CVA which was secondary to a PFO.  I think this was closed in June.  His last PET scan was done back in August.  Everything looked fine without any evidence of recurrent disease.  He has had no fever.  He has had no leg swelling.  He has had no obvious change in bowel or bladder habits.\  Of note, he did have upper endoscopy back on 11/24/2022.  This was negative for any Helicobacter.  He said that he has bacterial overgrowth in the small bowel.  He is on Flagyl for this.  Overall, I would say his performance status is probably ECOG 1.  Medications:  Allergies as of 11/30/2022        Reactions   Mango Flavor Swelling, Other (See Comments)   LIPS SWELL   Rituximab Other (See Comments)   Blood pressure went extremely low.    Shellfish Allergy Anaphylaxis   Lactose Intolerance (gi) Diarrhea        Medication List        Accurate as of November 30, 2022 11:18 AM. If you have any questions, ask your nurse or doctor.          acetaminophen 325 MG tablet Commonly known as: TYLENOL Take 650 mg by mouth daily as needed for moderate pain or headache.   ALPRAZolam 0.25 MG tablet Commonly known as: XANAX Take 0.25 mg by mouth 2 (two) times daily as needed for anxiety.   aspirin EC 81 MG tablet Take 1 tablet (81 mg total) by mouth daily. Swallow whole.   cetirizine 10 MG tablet Commonly known as: ZYRTEC Take 10 mg by mouth daily.   clopidogrel 75 MG tablet Commonly known as: PLAVIX Take 1 tablet (75 mg total) by mouth daily.   escitalopram 5 MG tablet Commonly known as: LEXAPRO Take 5 mg by mouth daily.   Farxiga 10 MG Tabs tablet Generic drug: dapagliflozin propanediol Take 10 mg by mouth daily.  ipratropium 0.03 % nasal spray Commonly known as: ATROVENT Place 2 sprays into both nostrils 2 (two) times daily.   lidocaine-prilocaine cream Commonly known as: EMLA Apply 1 Application topically as needed. Apply to [facility] 1 hour prior to procedure.   metFORMIN 500 MG tablet Commonly known as: GLUCOPHAGE Take 500 mg by mouth daily.   metoprolol succinate 25 MG 24 hr tablet Commonly known as: TOPROL-XL TAKE 1 TABLET BY MOUTH DAILY. CAN TAKE ADDITIONAL HALF TABLET IF HEART RATE IS OVER 100 What changed: See the new instructions.   metroNIDAZOLE 250 MG tablet Commonly known as: FLAGYL Take 2 tablets (500 mg total) by mouth 3 (three) times daily for 14 days.   predniSONE 20 MG tablet Commonly known as: DELTASONE Take 20 mg by mouth 3 (three) times daily.   rosuvastatin 40 MG tablet Commonly known as: CRESTOR Take 1 tablet (40 mg total) by mouth  daily.        Allergies:  Allergies  Allergen Reactions   Mango Flavor Swelling and Other (See Comments)    LIPS SWELL   Rituximab Other (See Comments)    Blood pressure went extremely low.    Shellfish Allergy Anaphylaxis   Lactose Intolerance (Gi) Diarrhea    Past Medical History, Surgical history, Social history, and Family History were reviewed and updated.  Review of Systems: Review of Systems  Constitutional: Negative.   HENT: Negative.    Eyes: Negative.   Respiratory: Negative.    Cardiovascular: Negative.   Gastrointestinal: Negative.   Genitourinary: Negative.   Musculoskeletal: Negative.   Skin: Negative.   Neurological: Negative.   Endo/Heme/Allergies: Negative.   Psychiatric/Behavioral: Negative.       Physical Exam:  height is 5' 11.75" (1.822 m) and weight is 276 lb (125.2 kg). His oral temperature is 97.8 F (36.6 C). His blood pressure is 111/69 and his pulse is 82. His respiration is 16.   Wt Readings from Last 3 Encounters:  11/30/22 276 lb (125.2 kg)  11/24/22 270 lb (122.5 kg)  09/28/22 269 lb (122 kg)    His vital signs are temperature of 97.8.  Pulse 82.  Blood pressure 111/69.  Weight is 276 pounds.   Physical Exam Vitals reviewed.  HENT:     Head: Normocephalic and atraumatic.  Eyes:     Pupils: Pupils are equal, round, and reactive to light.  Cardiovascular:     Rate and Rhythm: Normal rate and regular rhythm.     Heart sounds: Normal heart sounds.  Pulmonary:     Effort: Pulmonary effort is normal.     Breath sounds: Normal breath sounds.  Abdominal:     General: Bowel sounds are normal.     Palpations: Abdomen is soft.  Musculoskeletal:        General: No tenderness or deformity. Normal range of motion.     Cervical back: Normal range of motion.  Lymphadenopathy:     Cervical: No cervical adenopathy.  Skin:    General: Skin is warm and dry.     Findings: No erythema or rash.  Neurological:     Mental Status: He is  alert and oriented to person, place, and time.  Psychiatric:        Behavior: Behavior normal.        Thought Content: Thought content normal.        Judgment: Judgment normal.      Lab Results  Component Value Date   WBC 7.8 11/30/2022   HGB 14.1 11/30/2022  HCT 42.3 11/30/2022   MCV 91.0 11/30/2022   PLT 211 11/30/2022   Lab Results  Component Value Date   FERRITIN 55 09/28/2022   IRON 84 09/28/2022   TIBC 347 09/28/2022   UIBC 263 09/28/2022   IRONPCTSAT 24 09/28/2022   Lab Results  Component Value Date   RETICCTPCT 1.07 12/06/2015   RBC 4.65 11/30/2022   RETICCTABS 48.79 12/06/2015   Lab Results  Component Value Date   KPAFRELGTCHN 14.7 07/27/2022   LAMBDASER 13.2 07/27/2022   KAPLAMBRATIO 1.11 07/27/2022   Lab Results  Component Value Date   IGGSERUM 578 (L) 09/28/2022   IGA 115 09/28/2022   IGMSERUM 12 (L) 09/28/2022   Lab Results  Component Value Date   TOTALPROTELP 5.7 (L) 06/15/2022   ALBUMINELP 3.1 06/15/2022   A1GS 0.3 06/15/2022   A2GS 0.9 06/15/2022   BETS 1.0 06/15/2022   GAMS 0.4 06/15/2022   MSPIKE Not Observed 06/15/2022     Chemistry      Component Value Date/Time   NA 138 11/30/2022 1025   NA 143 02/04/2022 1506   NA 141 12/03/2016 1155   NA 141 02/07/2016 1149   K 4.2 11/30/2022 1025   K 3.8 12/03/2016 1155   K 4.3 02/07/2016 1149   CL 102 11/30/2022 1025   CL 100 12/03/2016 1155   CO2 29 11/30/2022 1025   CO2 27 12/03/2016 1155   CO2 27 02/07/2016 1149   BUN 17 11/30/2022 1025   BUN 11 02/04/2022 1506   BUN 10 12/03/2016 1155   BUN 10.9 02/07/2016 1149   CREATININE 1.17 11/30/2022 1025   CREATININE 1.0 12/03/2016 1155   CREATININE 0.9 02/07/2016 1149      Component Value Date/Time   CALCIUM 8.7 (L) 11/30/2022 1025   CALCIUM 9.1 12/03/2016 1155   CALCIUM 9.6 02/07/2016 1149   ALKPHOS 93 11/30/2022 1025   ALKPHOS 99 (H) 12/03/2016 1155   ALKPHOS 127 02/07/2016 1149   AST 12 (L) 11/30/2022 1025   AST 17 02/07/2016  1149   ALT 17 11/30/2022 1025   ALT 24 12/03/2016 1155   ALT 16 02/07/2016 1149   BILITOT 0.4 11/30/2022 1025   BILITOT 0.42 02/07/2016 1149       Impression and Plan: Mr. Sammarco is a pleasant 52 yo caucasian gentleman with history of follicular large cell non-Hodgkin's lymphoma. He completed 6 cycles of chemotherapy with bendamustine on 07/03/2016 but was not tolerant of Rituxan (anaphylaxis). His lymphoma has  recurred.  We then treated him with CHOP.  This got him into remission.  He has completed all of his treatment for the recurrent follicular large cell lymphoma.  I think that if this does come back again, he will probably be a good candidate for CAR-T therapy.  I really hate that he is having all these issues.  Hopefully, he will be able to get his diabetes under control.  Hopefully will be able to lose little bit of weight.  I will like to see him back probably in about 6 weeks or so.  I just feel bad that he is having these issues.   Josph Macho, MD 10/28/202411:18 AM

## 2022-11-30 NOTE — Patient Instructions (Signed)
Insulin Aspart Injection What is this medication? INSULIN ASPART (IN su lin AS part) treats diabetes. It works by increasing insulin levels in your body, which decreases your blood sugar (glucose). It belongs to a group of medications called rapid-acting insulins. Changes to diet and exercise are often combined with this medication. This medicine may be used for other purposes; ask your health care provider or pharmacist if you have questions. COMMON BRAND NAME(S): [place], [employer], [employer], [employer], NovoLog, NovoLog [place], NovoLog PenFill What should I tell my care team before I take this medication? They need to know if you have any of these conditions: Episodes of low blood sugar Eye disease, vision problems Kidney disease Liver disease An unusual or allergic reaction to insulin, metacresol, other medications, foods, dyes, or preservatives Pregnant or trying to get pregnant Breast-feeding How should I use this medication? This medication is injected under the skin. Use it exactly as directed. It is important to follow the directions given to you by your care team. If you are using Novolog, you should start your meal within 5 to 10 minutes after injection. If you are using Fiasp, you should start your meal at the time of injection or within 20 minutes after injection. Have food ready before injection. Do not delay eating. You will be taught how to use this medication and how to adjust doses for activities and illness. Do not use more insulin than prescribed. Do not use it more or less often than prescribed. Always check the appearance of your insulin before using it. This insulin should be clear and colorless like water. Do not use if it is cloudy, thickened, colored, or has solid particles in it. If you use a pen, be sure to take off the outer needle cover before using the dose. It is important that you put your used needles and syringes in a special sharps container. Do  not put them in a trash can. If you do not have a sharps container, call your pharmacist or care team to get one. This medication comes with INSTRUCTIONS FOR USE. Ask your pharmacist for directions on how to use this medication. Read the information carefully. Talk to your pharmacist or care team if you have questions. Talk to your care team about the use of this medication in children. While it may be prescribed for children as young as 2 years for selected conditions, precautions do apply. Overdosage: If you think you have taken too much of this medicine contact a poison control center or emergency room at once. NOTE: This medicine is only for you. Do not share this medicine with others. What if I miss a dose? It is important not to miss a dose. Your care team should discuss a plan for missed doses with you. If you do miss a dose, follow their plan. Do not take double doses. What may interact with this medication? Some medications may affect your blood sugar levels or hide the symptoms of low blood sugar (hypoglycemia). Talk with your care team about all of the medications you take. They may suggest changes to your insulin dose or checking your blood sugar levels more often. Medications that may affect your blood sugar levels include: Alcohol Certain antibiotics, such as ciprofloxacin, levofloxacin, sulfamethoxazole; trimethoprim Certain medications for blood pressure or heart disease, such as benazepril, enalapril, lisinopril, losartan, valsartan Certain medications for mental health conditions, such as fluoxetine or olanzapine Diuretics, such as hydrochlorothiazide (HCTZ) Estrogen and progestin hormones Other medications for diabetes  Steroid medications, such as prednisone or cortisone Testosterone Thyroid hormones Medications that may mask symptoms of low blood sugar include: Beta blockers, such as atenolol, metoprolol, propranolol Clonidine Guanethidine Reserpine This list may not  describe all possible interactions. Give your health care provider a list of all the medicines, herbs, non-prescription drugs, or dietary supplements you use. Also tell them if you smoke, drink alcohol, or use illegal drugs. Some items may interact with your medicine. What should I watch for while using this medication? Visit your care team for regular checks on your progress. Your care team will monitor your hemoglobin A1C. This is a simple blood test. It measures your average blood sugar level over the past 3 months. It will help you and your care team manage your diabetes. Learn how to check your blood sugar levels. Know the symptoms of low and high blood sugar and how to manage them. Always carry a source of quick sugar with you for symptoms of low blood sugar. Examples include glucose tablets, juice, or sugar candy. Teach your family members, friends, and others how to help you if your blood sugar is too low and you are not awake enough to treat it. Talk to your care team if you have high blood sugar. You may need to adjust your insulin dose. Many factors can cause high blood sugar, including illness, stress, or a change in activity. Do not skip meals. Ask your care team if you should avoid alcohol. Many cough and cold products contain sugar or alcohol. These can affect blood sugar levels. Make sure that you have the correct syringe for the type of insulin you use. Do not change the brand or type of insulin or syringe unless your care team tells you to. Switching insulin brand or type can affect your blood sugar enough to cause serious adverse effects. Always keep an extra supply of insulin and related supplies on hand. Only use syringes once. Get rid of syringes and needles in a closed container to prevent accidental needle sticks. Do not share insulin pens or cartridges with anyone, even if the needle is changed. Each pen should only be used by one person. Sharing could cause an infection. Do not  use a syringe to take insulin out of an insulin pen. Doing this may result in the wrong dose of insulin. Wear a medical ID bracelet or chain. Carry a card that describes your condition. List the medications and doses you take on the card. What side effects may I notice from receiving this medication? Side effects that you should report to your care team as soon as possible: Allergic reactions--skin rash, itching, hives, swelling of the face, lips, tongue, or throat Low blood sugar (hypoglycemia)--tremors or shaking, anxiety, sweating, cold or clammy skin, confusion, dizziness, rapid heartbeat Low potassium level--muscle pain or cramps, unusual weakness or fatigue, fast or irregular heartbeat, constipation Side effects that usually do not require medical attention (report to your care team if they continue or are bothersome): Lipodystrophy--hardening or scarring of tissue at injection site Pain, redness, or irritation at injection site Weight gain This list may not describe all possible side effects. Call your doctor for medical advice about side effects. You may report side effects to FDA at 1-800-FDA-1088. Where should I keep my medication? Keep out of the reach of children and pets. Storage and expiration dates for different insulin products may vary. Check the label for information on how to store your insulin. Talk to your care team if you have  any questions. Do not freeze. Protect from direct light and heat. Do not use insulin if it is exposed to temperatures above 37 degrees C (98.6 degrees F). Do not use insulin if it has been frozen. Fiasp multi-dose vials, Novolog multiple-dose vials, or Fiasp FlexTouch pen Unopened (not in-use): Store at room temperature up to 30 degrees C (86 degrees F) for up to 28 days, or refrigerated until the expiration date. Opened (in-use): Store at room temperature or refrigerated for up to 28 days. Fiasp PenFill cartridges, Novolog PenFill cartridges, Novolog  FlexPen, or Novolog FlexTouch Unopened (not in-use): Store at room temperature up to 30 degrees C (86 degrees F) for up to 28 days, or refrigerated until the expiration date. Opened (in-use): Store at room temperature for up to 28 days. Do not refrigerate. Fiasp PumpCart cartridges Unopened (not in-use): Store at room temperature up to 30 degrees C (86 degrees F) for up to 18 days (including 4 days in the pump), or refrigerated until the expiration date. Opened (in-use): Store at room temperature for up to 4 days. Do not refrigerate. Insulin Pump Users Change the insulin in your pump reservoir as directed by the pump user manual or the insulin label, whichever is first. To get rid of medications that are no longer needed or have expired: Take the medication to a medication take-back program. Check with your pharmacy or law enforcement to find a location. If you cannot return the medication, ask your pharmacist or care team how to get rid of this medication safely. NOTE: This sheet is a summary. It may not cover all possible information. If you have questions about this medicine, talk to your doctor, pharmacist, or health care provider.  2024 Elsevier/Gold Standard (2021-11-17 00:00:00)

## 2022-12-01 LAB — KAPPA/LAMBDA LIGHT CHAINS
Kappa free light chain: 11.7 mg/L (ref 3.3–19.4)
Kappa, lambda light chain ratio: 1.52 (ref 0.26–1.65)
Lambda free light chains: 7.7 mg/L (ref 5.7–26.3)

## 2022-12-01 LAB — IGG, IGA, IGM
IgA: 113 mg/dL (ref 90–386)
IgG (Immunoglobin G), Serum: 598 mg/dL — ABNORMAL LOW (ref 603–1613)
IgM (Immunoglobulin M), Srm: 13 mg/dL — ABNORMAL LOW (ref 20–172)

## 2022-12-02 ENCOUNTER — Encounter: Payer: Self-pay | Admitting: Hematology & Oncology

## 2022-12-02 DIAGNOSIS — G4733 Obstructive sleep apnea (adult) (pediatric): Secondary | ICD-10-CM | POA: Diagnosis not present

## 2022-12-02 LAB — PROTEIN ELECTROPHORESIS, SERUM, WITH REFLEX
A/G Ratio: 1.6 (ref 0.7–1.7)
Albumin ELP: 3.6 g/dL (ref 2.9–4.4)
Alpha-1-Globulin: 0.2 g/dL (ref 0.0–0.4)
Alpha-2-Globulin: 0.8 g/dL (ref 0.4–1.0)
Beta Globulin: 0.8 g/dL (ref 0.7–1.3)
Gamma Globulin: 0.5 g/dL (ref 0.4–1.8)
Globulin, Total: 2.3 g/dL (ref 2.2–3.9)
Total Protein ELP: 5.9 g/dL — ABNORMAL LOW (ref 6.0–8.5)

## 2022-12-03 DIAGNOSIS — G4733 Obstructive sleep apnea (adult) (pediatric): Secondary | ICD-10-CM | POA: Diagnosis not present

## 2022-12-09 DIAGNOSIS — H9122 Sudden idiopathic hearing loss, left ear: Secondary | ICD-10-CM | POA: Diagnosis not present

## 2022-12-09 DIAGNOSIS — H90A22 Sensorineural hearing loss, unilateral, left ear, with restricted hearing on the contralateral side: Secondary | ICD-10-CM | POA: Diagnosis not present

## 2022-12-18 DIAGNOSIS — E119 Type 2 diabetes mellitus without complications: Secondary | ICD-10-CM | POA: Diagnosis not present

## 2022-12-23 DIAGNOSIS — G4733 Obstructive sleep apnea (adult) (pediatric): Secondary | ICD-10-CM | POA: Diagnosis not present

## 2023-01-04 ENCOUNTER — Encounter: Payer: Self-pay | Admitting: Internal Medicine

## 2023-01-05 DIAGNOSIS — E119 Type 2 diabetes mellitus without complications: Secondary | ICD-10-CM | POA: Diagnosis not present

## 2023-01-08 DIAGNOSIS — G4733 Obstructive sleep apnea (adult) (pediatric): Secondary | ICD-10-CM | POA: Diagnosis not present

## 2023-01-15 DIAGNOSIS — E119 Type 2 diabetes mellitus without complications: Secondary | ICD-10-CM | POA: Diagnosis not present

## 2023-01-18 ENCOUNTER — Other Ambulatory Visit: Payer: Self-pay | Admitting: Cardiology

## 2023-01-18 MED ORDER — ASPIRIN 81 MG PO TBEC: 81.0000 mg | DELAYED_RELEASE_TABLET | Freq: Every day | ORAL | 12 refills | Status: AC

## 2023-01-22 ENCOUNTER — Other Ambulatory Visit: Payer: Self-pay

## 2023-01-22 ENCOUNTER — Inpatient Hospital Stay: Payer: BC Managed Care – PPO | Attending: Hematology & Oncology

## 2023-01-22 ENCOUNTER — Inpatient Hospital Stay (HOSPITAL_BASED_OUTPATIENT_CLINIC_OR_DEPARTMENT_OTHER): Payer: BC Managed Care – PPO | Admitting: Hematology & Oncology

## 2023-01-22 ENCOUNTER — Inpatient Hospital Stay: Payer: BC Managed Care – PPO

## 2023-01-22 VITALS — BP 121/76 | HR 77 | Temp 97.7°F | Resp 17 | Ht 71.0 in | Wt 265.0 lb

## 2023-01-22 DIAGNOSIS — C8223 Follicular lymphoma grade III, unspecified, intra-abdominal lymph nodes: Secondary | ICD-10-CM | POA: Insufficient documentation

## 2023-01-22 DIAGNOSIS — D801 Nonfamilial hypogammaglobulinemia: Secondary | ICD-10-CM | POA: Insufficient documentation

## 2023-01-22 DIAGNOSIS — Z8489 Family history of other specified conditions: Secondary | ICD-10-CM

## 2023-01-22 DIAGNOSIS — G4733 Obstructive sleep apnea (adult) (pediatric): Secondary | ICD-10-CM | POA: Diagnosis not present

## 2023-01-22 LAB — CMP (CANCER CENTER ONLY)
ALT: 21 U/L (ref 0–44)
AST: 18 U/L (ref 15–41)
Albumin: 4.4 g/dL (ref 3.5–5.0)
Alkaline Phosphatase: 97 U/L (ref 38–126)
Anion gap: 8 (ref 5–15)
BUN: 13 mg/dL (ref 6–20)
CO2: 28 mmol/L (ref 22–32)
Calcium: 9.2 mg/dL (ref 8.9–10.3)
Chloride: 105 mmol/L (ref 98–111)
Creatinine: 0.94 mg/dL (ref 0.61–1.24)
GFR, Estimated: >60 mL/min (ref >60)
Glucose, Bld: 110 mg/dL — ABNORMAL HIGH (ref 70–99)
Potassium: 3.9 mmol/L (ref 3.5–5.1)
Sodium: 141 mmol/L (ref 135–145)
Total Bilirubin: 0.6 mg/dL (ref <1.2)
Total Protein: 6.6 g/dL (ref 6.5–8.1)

## 2023-01-22 LAB — CBC WITH DIFFERENTIAL (CANCER CENTER ONLY)
Abs Immature Granulocytes: 0.02 10*3/uL (ref 0.00–0.07)
Basophils Absolute: 0 10*3/uL (ref 0.0–0.1)
Basophils Relative: 1 %
Eosinophils Absolute: 0.2 10*3/uL (ref 0.0–0.5)
Eosinophils Relative: 3 %
HCT: 41.8 % (ref 39.0–52.0)
Hemoglobin: 14.4 g/dL (ref 13.0–17.0)
Immature Granulocytes: 0 %
Lymphocytes Relative: 14 %
Lymphs Abs: 0.9 10*3/uL (ref 0.7–4.0)
MCH: 31.6 pg (ref 26.0–34.0)
MCHC: 34.4 g/dL (ref 30.0–36.0)
MCV: 91.7 fL (ref 80.0–100.0)
Monocytes Absolute: 1.1 10*3/uL — ABNORMAL HIGH (ref 0.1–1.0)
Monocytes Relative: 16 %
Neutro Abs: 4.4 10*3/uL (ref 1.7–7.7)
Neutrophils Relative %: 66 %
Platelet Count: 214 10*3/uL (ref 150–400)
RBC: 4.56 MIL/uL (ref 4.22–5.81)
RDW: 13.2 % (ref 11.5–15.5)
WBC Count: 6.6 10*3/uL (ref 4.0–10.5)
nRBC: 0 % (ref 0.0–0.2)

## 2023-01-22 LAB — LACTATE DEHYDROGENASE: LDH: 145 U/L (ref 98–192)

## 2023-01-22 NOTE — Patient Instructions (Signed)

## 2023-01-22 NOTE — Progress Notes (Signed)
Hematology and Oncology Follow Up Visit  James Jennings [id number] 05-Oct-1970 52 y.o. 01/22/2023   Principle Diagnosis:  Follicular B- cell non-Hodgkin's lymphoma - Relapsed Hypogammaglobulinemia   Past Therapy:             Rituxan/bendamustine-s/p cycle 6 - completed on 07/03/2016   Current Therapy:  Gazyva  - Cytoxan/ Vincristine/Prednisone  - started 11/07/2019, s/p cycle #4 --  D/c on 03/26/2020 G-CHOP -- s/p cycle #6-- started on 04/03/2020 -completed on 07/13/2020 Maintenance Gazyva-s/p cycle 10/12 --  start on 09/05/2020 --completed on 07/27/2022    Interim History:  James Jennings is here today for follow-up.  Unfortunately, he is lost hearing in the left ear.  He has been seen by ENT.  He has a hearing aid coming in next month.  He is doing okay otherwise.  His blood sugars are doing much better.  He is back on Ozempic.  He is losing some weight.  He is now is off Plavix.  I am happy that he is off Plavix.  Things got have another echocardiogram to assess the PFO next month or so.  He has had no issues with fever.  He has had no change in bowel or bladder habits.  He has had no rashes.  He has had no leg swelling.  He has had no cough or shortness of breath.  There is been no bleeding.  Overall, I would have to say that his performance status is probably ECOG 1.   Medications:  Allergies as of 01/22/2023       Reactions   Mango Flavoring Agent (non-screening) Swelling, Other (See Comments)   LIPS SWELL   Rituximab Other (See Comments)   Blood pressure went extremely low.    Shellfish Allergy Anaphylaxis   Lactose Intolerance (gi) Diarrhea        Medication List        Accurate as of January 22, 2023 11:49 AM. If you have any questions, ask your nurse or doctor.          STOP taking these medications    clopidogrel 75 MG tablet Commonly known as: PLAVIX Stopped by: Josph Macho       TAKE these medications    acetaminophen 325 MG tablet Commonly known  as: TYLENOL Take 650 mg by mouth daily as needed for moderate pain or headache.   ALPRAZolam 0.25 MG tablet Commonly known as: XANAX Take 0.25 mg by mouth 2 (two) times daily as needed for anxiety.   aspirin EC 81 MG tablet Take 1 tablet (81 mg total) by mouth daily. Swallow whole.   cetirizine 10 MG tablet Commonly known as: ZYRTEC Take 10 mg by mouth daily.   escitalopram 5 MG tablet Commonly known as: LEXAPRO Take 5 mg by mouth daily.   Farxiga 10 MG Tabs tablet Generic drug: dapagliflozin propanediol Take 1 tablet (10 mg total) by mouth daily.   ipratropium 0.03 % nasal spray Commonly known as: ATROVENT Place 2 sprays into both nostrils 2 (two) times daily.   lidocaine-prilocaine cream Commonly known as: EMLA Apply 1 Application topically as needed. Apply to [facility] 1 hour prior to procedure.   metFORMIN 500 MG tablet Commonly known as: GLUCOPHAGE Take 500 mg by mouth daily.   metoprolol succinate 25 MG 24 hr tablet Commonly known as: TOPROL-XL TAKE 1 TABLET BY MOUTH DAILY. CAN TAKE ADDITIONAL HALF TABLET IF HEART RATE IS OVER 100 What changed: See the new instructions.   OZEMPIC (1 MG/DOSE) [place] Inject  into the skin.   predniSONE 20 MG tablet Commonly known as: DELTASONE Take 20 mg by mouth 3 (three) times daily.   rosuvastatin 40 MG tablet Commonly known as: CRESTOR Take 1 tablet (40 mg total) by mouth daily.        Allergies:  Allergies  Allergen Reactions   Mango Flavoring Agent (Non-Screening) Swelling and Other (See Comments)    LIPS SWELL   Rituximab Other (See Comments)    Blood pressure went extremely low.    Shellfish Allergy Anaphylaxis   Lactose Intolerance (Gi) Diarrhea    Past Medical History, Surgical history, Social history, and Family History were reviewed and updated.  Review of Systems: Review of Systems  Constitutional: Negative.   HENT: Negative.    Eyes: Negative.   Respiratory: Negative.    Cardiovascular: Negative.    Gastrointestinal: Negative.   Genitourinary: Negative.   Musculoskeletal: Negative.   Skin: Negative.   Neurological: Negative.   Endo/Heme/Allergies: Negative.   Psychiatric/Behavioral: Negative.       Physical Exam:  height is 5\' 11"  (1.803 m) and weight is 265 lb (120.2 kg). His oral temperature is 97.7 F (36.5 C). His blood pressure is 121/76 and his pulse is 77. His respiration is 17 and oxygen saturation is 100%.   Wt Readings from Last 3 Encounters:  01/22/23 265 lb (120.2 kg)  11/30/22 276 lb (125.2 kg)  11/24/22 270 lb (122.5 kg)    His vital signs are temperature of 97.8.  Pulse 82.  Blood pressure 111/69.  Weight is 276 pounds.   Physical Exam Vitals reviewed.  HENT:     Head: Normocephalic and atraumatic.  Eyes:     Pupils: Pupils are equal, round, and reactive to light.  Cardiovascular:     Rate and Rhythm: Normal rate and regular rhythm.     Heart sounds: Normal heart sounds.  Pulmonary:     Effort: Pulmonary effort is normal.     Breath sounds: Normal breath sounds.  Abdominal:     General: Bowel sounds are normal.     Palpations: Abdomen is soft.  Musculoskeletal:        General: No tenderness or deformity. Normal range of motion.     Cervical back: Normal range of motion.  Lymphadenopathy:     Cervical: No cervical adenopathy.  Skin:    General: Skin is warm and dry.     Findings: No erythema or rash.  Neurological:     Mental Status: He is alert and oriented to person, place, and time.  Psychiatric:        Behavior: Behavior normal.        Thought Content: Thought content normal.        Judgment: Judgment normal.     Lab Results  Component Value Date   WBC 6.6 01/22/2023   HGB 14.4 01/22/2023   HCT 41.8 01/22/2023   MCV 91.7 01/22/2023   PLT 214 01/22/2023   Lab Results  Component Value Date   FERRITIN 55 09/28/2022   IRON 84 09/28/2022   TIBC 347 09/28/2022   UIBC 263 09/28/2022   IRONPCTSAT 24 09/28/2022   Lab Results   Component Value Date   RETICCTPCT 1.07 12/06/2015   RBC 4.56 01/22/2023   RETICCTABS 48.79 12/06/2015   Lab Results  Component Value Date   KPAFRELGTCHN 11.7 11/30/2022   LAMBDASER 7.7 11/30/2022   KAPLAMBRATIO 1.52 11/30/2022   Lab Results  Component Value Date   IGGSERUM 598 (L) 11/30/2022  IGA 113 11/30/2022   IGMSERUM 13 (L) 11/30/2022   Lab Results  Component Value Date   TOTALPROTELP 5.9 (L) 11/30/2022   ALBUMINELP 3.6 11/30/2022   A1GS 0.2 11/30/2022   A2GS 0.8 11/30/2022   BETS 0.8 11/30/2022   GAMS 0.5 11/30/2022   MSPIKE Not Observed 11/30/2022     Chemistry      Component Value Date/Time   NA 141 01/22/2023 1015   NA 143 02/04/2022 1506   NA 141 12/03/2016 1155   NA 141 02/07/2016 1149   K 3.9 01/22/2023 1015   K 3.8 12/03/2016 1155   K 4.3 02/07/2016 1149   CL 105 01/22/2023 1015   CL 100 12/03/2016 1155   CO2 28 01/22/2023 1015   CO2 27 12/03/2016 1155   CO2 27 02/07/2016 1149   BUN 13 01/22/2023 1015   BUN 11 02/04/2022 1506   BUN 10 12/03/2016 1155   BUN 10.9 02/07/2016 1149   CREATININE 0.94 01/22/2023 1015   CREATININE 1.0 12/03/2016 1155   CREATININE 0.9 02/07/2016 1149      Component Value Date/Time   CALCIUM 9.2 01/22/2023 1015   CALCIUM 9.1 12/03/2016 1155   CALCIUM 9.6 02/07/2016 1149   ALKPHOS 97 01/22/2023 1015   ALKPHOS 99 (H) 12/03/2016 1155   ALKPHOS 127 02/07/2016 1149   AST 18 01/22/2023 1015   AST 17 02/07/2016 1149   ALT 21 01/22/2023 1015   ALT 24 12/03/2016 1155   ALT 16 02/07/2016 1149   BILITOT 0.6 01/22/2023 1015   BILITOT 0.42 02/07/2016 1149       Impression and Plan: James Jennings is a pleasant 52 yo caucasian gentleman with history of follicular large cell non-Hodgkin's lymphoma. He completed 6 cycles of chemotherapy with bendamustine on 07/03/2016 but was not tolerant of Rituxan (anaphylaxis). His lymphoma has  recurred.  We then treated him with CHOP.  This got him into remission.  He has completed all of  his treatment for the recurrent follicular large cell lymphoma.  I think that if this does come back again, he will probably be a good candidate for CAR-T therapy.  I am glad that his blood sugars doing much better.  I know that the Ozempic will really help him out.  That the weight loss will also help him out.  I think we probably do need to get another PET scan on him.  I think the last 1 was done back in August 2024.  Will see about getting 1 set of for him in couple months.  He still has a Port-A-Cath in.  We will continue to flush this every 2 months.   Josph Macho, MD 12/20/202411:49 AM

## 2023-01-24 LAB — IGG, IGA, IGM
IgA: 116 mg/dL (ref 90–386)
IgG (Immunoglobin G), Serum: 542 mg/dL — ABNORMAL LOW (ref 603–1613)
IgM (Immunoglobulin M), Srm: 11 mg/dL — ABNORMAL LOW (ref 20–172)

## 2023-01-30 ENCOUNTER — Other Ambulatory Visit: Payer: Self-pay | Admitting: Internal Medicine

## 2023-02-01 ENCOUNTER — Other Ambulatory Visit: Payer: Self-pay | Admitting: Cardiology

## 2023-02-10 DIAGNOSIS — G4733 Obstructive sleep apnea (adult) (pediatric): Secondary | ICD-10-CM | POA: Diagnosis not present

## 2023-02-15 DIAGNOSIS — R197 Diarrhea, unspecified: Secondary | ICD-10-CM | POA: Diagnosis not present

## 2023-02-15 DIAGNOSIS — K529 Noninfective gastroenteritis and colitis, unspecified: Secondary | ICD-10-CM | POA: Diagnosis not present

## 2023-02-17 ENCOUNTER — Encounter: Payer: Self-pay | Admitting: Hematology & Oncology

## 2023-02-26 ENCOUNTER — Inpatient Hospital Stay: Payer: BC Managed Care – PPO | Attending: Hematology & Oncology

## 2023-02-26 ENCOUNTER — Other Ambulatory Visit: Payer: Self-pay

## 2023-02-26 VITALS — BP 144/93 | HR 69 | Temp 98.1°F | Resp 17

## 2023-02-26 DIAGNOSIS — D801 Nonfamilial hypogammaglobulinemia: Secondary | ICD-10-CM | POA: Diagnosis not present

## 2023-02-26 DIAGNOSIS — Z452 Encounter for adjustment and management of vascular access device: Secondary | ICD-10-CM | POA: Diagnosis not present

## 2023-02-26 DIAGNOSIS — C8223 Follicular lymphoma grade III, unspecified, intra-abdominal lymph nodes: Secondary | ICD-10-CM | POA: Insufficient documentation

## 2023-02-26 MED ORDER — SODIUM CHLORIDE 0.9% FLUSH
10.0000 mL | INTRAVENOUS | Status: DC | PRN
  Administered 2023-02-26: 10 mL via INTRAVENOUS

## 2023-02-26 MED ORDER — HEPARIN SOD (PORK) LOCK FLUSH 100 UNIT/ML IV SOLN
500.0000 [IU] | Freq: Once | INTRAVENOUS | Status: AC
  Administered 2023-02-26: 500 [IU] via INTRAVENOUS

## 2023-02-26 MED ORDER — LIDOCAINE-PRILOCAINE 2.5-2.5 % EX CREA: 1.0000 | TOPICAL_CREAM | CUTANEOUS | 3 refills | Status: AC | PRN

## 2023-02-26 NOTE — Patient Instructions (Signed)
Implanted [facility] Guide An implanted port is a device that is placed under the skin. It is usually placed in the chest. The device may vary based on the need. Implanted ports can be used to give IV medicine, to take blood, or to give fluids. You may have an implanted port if: You need IV medicine that would be irritating to the small veins in your hands or arms. You need IV medicines, such as chemotherapy, for a long period of time. You need IV nutrition for a long period of time. You may have fewer limitations when using a port than you would if you used other types of long-term IVs. You will also likely be able to return to normal activities after your incision heals. An implanted port has two main parts: Reservoir. The reservoir is the part where a needle is inserted to give medicines or draw blood. The reservoir is round. After the port is placed, it appears as a small, raised area under your skin. Catheter. The catheter is a small, thin tube that connects the reservoir to a vein. Medicine that is inserted into the reservoir goes into the catheter and then into the vein. How is my port accessed? To access your port: A numbing cream may be placed on the skin over the port site. Your health care provider will put on a mask and sterile gloves. The skin over your port will be cleaned carefully with a germ-killing soap and allowed to dry. Your health care provider will gently pinch the port and insert a needle into it. Your health care provider will check for a blood return to make sure the port is in the vein and is still working (patent). If your port needs to remain accessed to get medicine continuously (constant infusion), your health care provider will place a clear bandage (dressing) over the needle site. The dressing and needle will need to be changed every week, or as told by your health care provider. What is flushing? Flushing helps keep the port working. Follow instructions from your  health care provider about how and when to flush the port. Ports are usually flushed with saline solution or a medicine called heparin. The need for flushing will depend on how the port is used: If the port is only used from time to time to give medicines or draw blood, the port may need to be flushed: Before and after medicines have been given. Before and after blood has been drawn. As part of routine maintenance. Flushing may be recommended every 4-6 weeks. If a constant infusion is running, the port may not need to be flushed. Throw away any syringes in a disposal container that is meant for sharp items (sharps container). You can buy a sharps container from a pharmacy, or you can make one by using an empty hard plastic bottle with a cover. How long will my port stay implanted? The port can stay in for as long as your health care provider thinks it is needed. When it is time for the port to come out, a surgery will be done to remove it. The surgery will be similar to the procedure that was done to put the port in. Follow these instructions at home: Caring for your port and port site Flush your port as told by your health care provider. If you need an infusion over several days, follow instructions from your health care provider about how to take care of your port site. Make sure you: Change your  dressing as told by your health care provider. Wash your hands with soap and water for at least 20 seconds before and after you change your dressing. If soap and water are not available, use alcohol-based hand sanitizer. Place any used dressings or infusion bags into a plastic bag. Throw that bag in the trash. Keep the dressing that covers the needle clean and dry. Do not get it wet. Do not use scissors or sharp objects near the infusion tubing. Keep any external tubes clamped, unless they are being used. Check your port site every day for signs of infection. Check for: Redness, swelling, or  pain. Fluid or blood. Warmth. Pus or a bad smell. Protect the skin around the port site. Avoid wearing bra straps that rub or irritate the site. Protect the skin around your port from seat belts. Place a soft pad over your chest if needed. Bathe or shower as told by your health care provider. The site may get wet as long as you are not actively receiving an infusion. General instructions  Return to your normal activities as told by your health care provider. Ask your health care provider what activities are safe for you. Carry a medical alert card or wear a medical alert bracelet at all times. This will let health care providers know that you have an implanted port in case of an emergency. Where to find more information American Cancer Society: www.cancer.org American Society of Clinical Oncology: www.cancer.net Contact a health care provider if: You have a fever or chills. You have redness, swelling, or pain at the port site. You have fluid or blood coming from your port site. Your incision feels warm to the touch. You have pus or a bad smell coming from the port site. Summary Implanted ports are usually placed in the chest for long-term IV access. Follow instructions from your health care provider about flushing the port and changing bandages (dressings). Take care of the area around your port by avoiding clothing that puts pressure on the area, and by watching for signs of infection. Protect the skin around your port from seat belts. Place a soft pad over your chest if needed. Contact a health care provider if you have a fever or you have redness, swelling, pain, fluid, or a bad smell at the port site. This information is not intended to replace advice given to you by your health care provider. Make sure you discuss any questions you have with your health care provider. Document Revised: 07/23/2020 Document Reviewed: 07/23/2020 Elsevier Patient Education  2024 [employer].

## 2023-03-01 ENCOUNTER — Other Ambulatory Visit: Payer: Self-pay | Admitting: Internal Medicine

## 2023-03-08 ENCOUNTER — Ambulatory Visit: Payer: BC Managed Care – PPO | Attending: Cardiology

## 2023-03-08 ENCOUNTER — Ambulatory Visit (INDEPENDENT_AMBULATORY_CARE_PROVIDER_SITE_OTHER): Payer: BC Managed Care – PPO | Admitting: Cardiology

## 2023-03-08 VITALS — BP 96/68 | HR 88 | Ht 71.0 in | Wt 262.4 lb

## 2023-03-08 DIAGNOSIS — I10 Essential (primary) hypertension: Secondary | ICD-10-CM | POA: Insufficient documentation

## 2023-03-08 DIAGNOSIS — Q2112 Patent foramen ovale: Secondary | ICD-10-CM | POA: Insufficient documentation

## 2023-03-08 DIAGNOSIS — I253 Aneurysm of heart: Secondary | ICD-10-CM | POA: Diagnosis not present

## 2023-03-08 DIAGNOSIS — I951 Orthostatic hypotension: Secondary | ICD-10-CM | POA: Insufficient documentation

## 2023-03-08 DIAGNOSIS — I63432 Cerebral infarction due to embolism of left posterior cerebral artery: Secondary | ICD-10-CM | POA: Diagnosis not present

## 2023-03-08 LAB — ECHOCARDIOGRAM COMPLETE BUBBLE STUDY
Area-P 1/2: 4.96 cm2
Calc EF: 57.2 %
S' Lateral: 3.17 cm
Single Plane A2C EF: 58.4 %
Single Plane A4C EF: 56.1 %

## 2023-03-08 MED ORDER — PERFLUTREN LIPID MICROSPHERE
1.0000 mL | INTRAVENOUS | Status: AC | PRN
  Administered 2023-03-08: 5 mL via INTRAVENOUS

## 2023-03-08 NOTE — Patient Instructions (Signed)
Medication Instructions:  Please discontinue your Metoprolol. Continue all other medications as listed.  *If you need a refill on your cardiac medications before your next appointment, please call your pharmacy*   Follow-Up: At [facility], you and your health needs are our priority.  As part of our continuing mission to provide you with exceptional heart care, we have created designated Provider Care Teams.  These Care Teams include your primary Cardiologist (physician) and Advanced Practice Providers (APPs -  Physician Assistants and Nurse Practitioners) who all work together to provide you with the care you need, when you need it.  We recommend signing up for the patient portal called "MyChart".  Sign up information is provided on this After Visit Summary.  MyChart is used to connect with patients for Virtual Visits (Telemedicine).  Patients are able to view lab/test results, encounter notes, upcoming appointments, etc.  Non-urgent messages can be sent to your provider as well.   To learn more about what you can do with MyChart, go to [email or web address].    Your next appointment:   Follow up as instructed with Noreene Larsson.

## 2023-03-08 NOTE — Progress Notes (Signed)
   [facility]   MULTIDISCIPLINARY HEART VALVE TEAM  Structural Heart Office Note:  .    Date:  03/08/2023  ID:  Marja Kays, DOB May 11, 1970, MRN [id number] PCP: Garlan Fillers, MD  [facility] Providers Cardiologist: Dr. Geralynn Rile, MD (PFO)  History of Present Illness: .   James Jennings is a 53 y.o. male with a hx of left PCA ischemic CVA, PFO with atrial septal aneurysm, HTN, aortic atherosclerosis, non-Hodgkin's lymphoma disease in remission), hyperlipidemia, DM type II, sleep apnea who is now s/p PFO closure and being seen today for follow up.    Mr. Lehigh was initially seen by Dr. Anne Fu for the evaluation of PVCs and tachycardia. He presented to the ED 12/01/2021 with acute stroke symptoms treated with IV TNK. He was started on Plavix and ASA. 2D echo was completed 12/02/2021 with EF 60-65%.  He underwent TEE on 11/2 that showed small PFO with aneurysmal septum. He was referred to structural heart and was seen by Dr.Thukkani for evaluation. He was given a 30-day event monitor to rule out possible atrial fibrillation and plan for transcatheter closure if no AF is present. Monitoring device company sent alert on 12/12 indicating episode of VT. Beta blocker was initiated.    He is now s/p PFO closure with successful PFO closure with 30mm Gore Cardioform device with recommendations for DAPT with ASA and Plavix for 6 months and then aspirin monotherapy indefinitely.    Today he is here with his wife. He has been feeling well since he was last seen although noted to have very soft BP today at 96/68 with orthostatic symptoms. Otherwise he denies chest pain, SOB, palpitations, LE edema, orthopnea, PND, or syncope.   Physical Exam:   VS:  BP 96/68   Pulse 88   Ht 5\' 11"  (1.803 m)   Wt 262 lb 6.4 oz (119 kg)   SpO2 97%   BMI 36.60 kg/m    Wt Readings from Last 3 Encounters:  03/08/23 262 lb 6.4 oz (119 kg)  01/22/23 265 lb (120.2 kg)  11/30/22 276 lb (125.2 kg)     General: Well developed, well nourished, NAD Lungs:Clear to ausculation bilaterally. No wheezes, rales, or rhonchi. Breathing is unlabored. Cardiovascular: RRR with S1 S2. No murmurs Extremities: No edema.  Neuro: Alert and oriented. No focal deficits. No facial asymmetry. MAE spontaneously. Psych: Responds to questions appropriately with normal affect.    ASSESSMENT AND PLAN: .    PFO: s/p PFO closure 03/06/22 with 30mm Gore Cardioform device. Continue ASA 81mg  indefinitely. No longer requires dental SBE. Echo with bubble with no intraatrial shunting noted however awaiting final cardiology read. Plan 1-2 week follow up with myself after medication change for orthostatic hypotension as below.    Palpitations: Post op ZIO with intermittent SVT and underlying SR. No recurrence. Suspect from cardiac manipulation from PFO closure. Stop Toprol given hypotension and follow in 1-2 weeks.    Hypotension: BP today at 96/68. Stop Toprol and follow symptoms in 1-2 weeks.    Stroke: No residual noted. Continues to follow with neurology. Continue ASA, statin  I spent 20 minutes caring for this patient today including face-to-face discussions, ordering and reviewing labs, reviewing records from [facility] and other outside facilities, documenting in the record, and arranging for follow up.    Signed, Georgie Chard, NP

## 2023-03-11 NOTE — Progress Notes (Signed)
Virtual Visit via Telephone Note   Because of James Jennings's co-morbid illnesses, he is at least at moderate risk for complications without adequate follow up.  This format is felt to be most appropriate for this patient at this time.  The patient did not have access to video technology/had technical difficulties with video requiring transitioning to audio format only (telephone).  All issues noted in this document were discussed and addressed.  No physical exam could be performed with this format.  Please refer to the patient's chart for his consent to telehealth for [facility].   Date:  03/17/2023   ID:  James Jennings, DOB 08-Apr-1970, MRN [id number] The patient was identified using 2 identifiers.  Patient Location: Home Provider Location: Office/Clinic  PCP:  Garlan Fillers, MD   [facility] Providers Cardiologist:  Dr. Lynnette Caffey, MD (PFO)  Evaluation Performed:  Follow-Up Visit  Chief Complaint:  Hypotension  History of Present Illness:    James Jennings is a 53 y.o. male with  a hx of left PCA ischemic CVA, PFO with atrial septal aneurysm, HTN, aortic atherosclerosis, non-Hodgkin's lymphoma disease in remission), hyperlipidemia, DM type II, sleep apnea who is now s/p PFO closure and being seen today for follow up after recent medication changes.    [name] was initially seen by Dr. Anne Fu for the evaluation of PVCs and tachycardia. He presented to the ED 12/01/2021 with acute stroke symptoms treated with IV TNK. He was started on Plavix and ASA. 2D echo was completed 12/02/2021 with EF 60-65%.  He underwent TEE on 11/2 that showed small PFO with aneurysmal septum. He was referred to structural heart and was seen by Dr.Thukkani for evaluation. He was given a 30-day event monitor to rule out possible atrial fibrillation and plan for transcatheter closure if no AF is present. Monitoring device company sent alert on 12/12 indicating episode of VT. Beta blocker was initiated.     He is now s/p PFO closure with successful PFO closure with 30mm Gore Cardioform device with recommendations for DAPT with ASA and Plavix for 6 months and then aspirin monotherapy indefinitely.    He was recently seen in follow up and was doing well overall however BP was noted to be very soft at 96/68 with orthostatic symptoms. Plan was to stop Toprol with close follow up.   Today he reports his BP and symptoms of dizziness have improved since stopping the Toprol. Otherwise he denies chest pain, SOB, palpitations, LE edema, orthopnea, PND, dizziness, or syncope.   Past Medical History:  Diagnosis Date   Anxiety    Bilateral swelling of feet    Diabetes mellitus without complication ([name])    High cholesterol    Hypogammaglobulinemia ([name]) 06/15/2022   Joint pain    Lactose intolerance    Large cell, follicular non-Hodgkin's lymphoma ([name])    Lymphoma, follicular ([name]) dx'd 01/2016   Multiple food allergies    Other fatigue    Palpitations    PONV (postoperative nausea and vomiting)    Pre-diabetes    Prediabetes    Shortness of breath on exertion    Sleep apnea    "dx'd ~ 2008; never RX'd mask" (02/28/2016)   Stroke [facility])    Past Surgical History:  Procedure Laterality Date   ACHILLES TENDON SURGERY Left ~ 2012   APPENDECTOMY  11/12/2015   lap appy   BUBBLE STUDY  12/04/2021   Procedure: BUBBLE STUDY;  Surgeon: Jodelle Red, MD;  Location: [facility] ENDOSCOPY;  Service: Cardiovascular;;   CLUB FOOT RELEASE Bilateral 1971   HERNIA REPAIR     INGUINAL LYMPH NODE BIOPSY Right 01/20/2016   Procedure: EXCISIONAL BIOPSY OF RIGHT INGUINAL LYMPH NODE;  Surgeon: Gaynelle Adu, MD;  Location: [facility] OR;  Service: General;  Laterality: Right;   IR IMAGING GUIDED PORT INSERTION  11/02/2019   LAPAROSCOPIC APPENDECTOMY N/A 11/12/2015   Procedure: APPENDECTOMY LAPAROSCOPIC;  Surgeon: Gaynelle Adu, MD;  Location: [facility] OR;  Service: General;  Laterality: N/A;   LYMPH NODE BIOPSY Left 03/20/2020    Procedure: EXCISIONAL BIOPSY LEFT INGUINAL LYMPH NODE;  Surgeon: Abigail Miyamoto, MD;  Location: [facility] OR;  Service: General;  Laterality: Left;  LMA   PATENT FORAMEN OVALE(PFO) CLOSURE N/A 03/06/2022   Procedure: PATENT FORAMEN OVALE(PFO) CLOSURE;  Surgeon: Orbie Pyo, MD;  Location: [facility] INVASIVE CV LAB;  Service: Cardiovascular;  Laterality: N/A;   SUTURE REMOVAL Left ~ 2016-2017 X 3   "had to use permanent sutures w/my achilles OR; my body rejects them at times & I have to have them surgically removed; under anesthesia"   TEE WITHOUT CARDIOVERSION N/A 12/04/2021   Procedure: TRANSESOPHAGEAL ECHOCARDIOGRAM (TEE);  Surgeon: Jodelle Red, MD;  Location: [facility] ENDOSCOPY;  Service: Cardiovascular;  Laterality: N/A;   VASECTOMY Bilateral 07/2019   VENTRAL HERNIA REPAIR  1994    Current Meds  Medication Sig   acetaminophen (TYLENOL) 325 MG tablet Take 650 mg by mouth daily as needed for moderate pain or headache.   ALPRAZolam (XANAX) 0.25 MG tablet Take 0.25 mg by mouth 2 (two) times daily as needed for anxiety.   aspirin EC 81 MG tablet Take 1 tablet (81 mg total) by mouth daily. Swallow whole.   cetirizine (ZYRTEC) 10 MG tablet Take 10 mg by mouth daily.   escitalopram (LEXAPRO) 5 MG tablet Take 5 mg by mouth daily.   FARXIGA 10 MG TABS tablet Take 1 tablet (10 mg total) by mouth daily.   lidocaine-prilocaine (EMLA) cream Apply 1 Application topically as needed. Apply to [facility] 1 hour prior to procedure.   metFORMIN (GLUCOPHAGE) 500 MG tablet Take 500 mg by mouth daily.   ondansetron (ZOFRAN-ODT) 4 MG disintegrating tablet Take 4 mg by mouth as needed.   OZEMPIC, 2 MG/DOSE, 8 MG/3ML SOPN Inject 2 mg into the skin once a week.   rosuvastatin (CRESTOR) 40 MG tablet TAKE 1 TABLET BY MOUTH EVERY DAY    Allergies:   Mango flavoring agent (non-screening), Rituximab, Shellfish allergy, and Lactose intolerance (gi)   Social History   Tobacco Use   Smoking status: Former    Current packs/day:  0.00    Average packs/day: 1 pack/day for 30.0 years (30.0 ttl pk-yrs)    Types: Cigarettes    Start date: 05/06/1987    Quit date: 05/05/2017    Years since quitting: 5.8   Smokeless tobacco: Former    Types: Snuff    Quit date: 1997   Tobacco comments:    11/12/2015 "quit using chew in ~ 1997"  Vaping Use   Vaping status: Never Used  Substance Use Topics   Alcohol use: Not Currently   Drug use: Not Currently    Types: Marijuana    Comment: "in the early 1990s; recreational"    Family Hx: The patient's family history is negative for Colon cancer, Colon polyps, Esophageal cancer, Stomach cancer, Rectal cancer, Inflammatory bowel disease, Liver disease, and Pancreatic cancer.  ROS:   Please see the history of present illness.     All  other systems reviewed and are negative.  Prior CV studies:   The following studies were reviewed today:  Cardiac Studies & Procedures   ______________________________________________________________________________________________ CARDIAC CATHETERIZATION  CARDIAC CATHETERIZATION 03/06/2022  Narrative 1.  Successful PFO closure with 30 mm Gore Cardioform device.  Recommendation: Dual antiplatelet for 6 months and then aspirin monotherapy indefinitely; antibiotic prophylaxis for any dental work or dental cleanings for 6 months.     ECHOCARDIOGRAM  ECHOCARDIOGRAM COMPLETE BUBBLE STUDY 03/08/2023  Narrative ECHOCARDIOGRAM REPORT    Patient Name:   Jermy Laguna     Date of Exam: 03/08/2023 Medical Rec #:  [id number]     Height:       71.0 in Accession #:    [id number]    Weight:       265.0 lb Date of Birth:  09-Apr-1970    BSA:          2.376 m Patient Age:    52 years      BP:           144/93 mmHg Patient Gender: M             HR:           101 bpm. Exam Location:  [address]  Procedure: 2D Echo, Cardiac Doppler, Color Doppler, Saline Contrast Bubble Study and Intracardiac Opacification Agent  Indications:    PFO (patent formamen ovale)  745.5 / Q21.1  History:        Patient has prior history of Echocardiogram examinations, most recent 02/21/2021. Stroke, Signs/Symptoms:Shortness of Breath; Risk Factors:Diabetes and Sleep Apnea. PFO Closure 03/06/22.  Sonographer:    Eulah Pont RDCS Referring Phys: [phone] Jonavon Trieu D Cashis Rill  IMPRESSIONS   1. Left ventricular ejection fraction, by estimation, is 60 to 65%. The left ventricle has normal function. The left ventricle has no regional wall motion abnormalities. Left ventricular diastolic parameters were normal. 2. Right ventricular systolic function is normal. The right ventricular size is normal. Tricuspid regurgitation signal is inadequate for assessing PA pressure. 3. The mitral valve is grossly normal. No evidence of mitral valve regurgitation. No evidence of mitral stenosis. 4. The aortic valve is tricuspid. Aortic valve regurgitation is not visualized. Aortic valve sclerosis is present, with no evidence of aortic valve stenosis. 5. The inferior vena cava is normal in size with greater than 50% respiratory variability, suggesting right atrial pressure of 3 mmHg. 6. Agitated saline contrast bubble study was negative, with no evidence of any interatrial shunt. S/P 30 mm Gore Cardioform device. Agitated saline contrast bubble study was negative, with no evidence of any interatrial shunt.  FINDINGS Left Ventricle: Left ventricular ejection fraction, by estimation, is 60 to 65%. The left ventricle has normal function. The left ventricle has no regional wall motion abnormalities. Definity contrast agent was given IV to delineate the left ventricular endocardial borders. The left ventricular internal cavity size was normal in size. There is no left ventricular hypertrophy. Left ventricular diastolic parameters were normal.  Right Ventricle: The right ventricular size is normal. No increase in right ventricular wall thickness. Right ventricular systolic function is normal. Tricuspid  regurgitation signal is inadequate for assessing PA pressure.  Left Atrium: Left atrial size was normal in size.  Right Atrium: Right atrial size was normal in size.  Pericardium: Trivial pericardial effusion is present.  Mitral Valve: The mitral valve is grossly normal. There is mild calcification of the mitral valve leaflet(s). Normal mobility of the mitral valve leaflets. Mild mitral annular calcification. No evidence  of mitral valve regurgitation. No evidence of mitral valve stenosis.  Tricuspid Valve: The tricuspid valve is normal in structure. Tricuspid valve regurgitation is trivial. No evidence of tricuspid stenosis.  Aortic Valve: The aortic valve is tricuspid. Aortic valve regurgitation is not visualized. Aortic valve sclerosis is present, with no evidence of aortic valve stenosis.  Pulmonic Valve: The pulmonic valve was grossly normal. Pulmonic valve regurgitation is not visualized. No evidence of pulmonic stenosis.  Aorta: The aortic root and ascending aorta are structurally normal, with no evidence of dilitation.  Venous: The inferior vena cava is normal in size with greater than 50% respiratory variability, suggesting right atrial pressure of 3 mmHg.  IAS/Shunts: No atrial level shunt detected by color flow Doppler. Agitated saline contrast was given intravenously to evaluate for intracardiac shunting. Agitated saline contrast bubble study was negative, with no evidence of any interatrial shunt. S/P 30 mm Gore Cardioform device. Agitated saline contrast bubble study was negative, with no evidence of any interatrial shunt.   LEFT VENTRICLE PLAX 2D LVIDd:         4.45 cm      Diastology LVIDs:         3.17 cm      LV e' medial:    6.85 cm/s LV PW:         0.97 cm      LV E/e' medial:  6.3 LV IVS:        1.05 cm      LV e' lateral:   11.20 cm/s LVOT diam:     2.03 cm      LV E/e' lateral: 3.8 LV SV:         42 LV SV Index:   18 LVOT Area:     3.24 cm  LV Volumes  (MOD) LV vol d, MOD A2C: 109.0 ml LV vol d, MOD A4C: 84.3 ml LV vol s, MOD A2C: 45.3 ml LV vol s, MOD A4C: 37.0 ml LV SV MOD A2C:     63.7 ml LV SV MOD A4C:     84.3 ml LV SV MOD BP:      55.8 ml  RIGHT VENTRICLE RV S prime:     14.10 cm/s TAPSE (M-mode): 1.8 cm  LEFT ATRIUM             Index        RIGHT ATRIUM           Index LA diam:        3.31 cm 1.39 cm/m   RA Area:     15.60 cm LA Vol (A2C):   34.0 ml 14.31 ml/m  RA Volume:   37.70 ml  15.86 ml/m LA Vol (A4C):   28.6 ml 12.03 ml/m LA Biplane Vol: 32.3 ml 13.59 ml/m AORTIC VALVE LVOT Vmax:   79.90 cm/s LVOT Vmean:  55.300 cm/s LVOT VTI:    0.129 m  AORTA Ao Root diam: 3.75 cm Ao Asc diam:  3.42 cm  MITRAL VALVE MV Area (PHT): 4.96 cm    SHUNTS MV Decel Time: 153 msec    Systemic VTI:  0.13 m MV E velocity: 43.00 cm/s  Systemic Diam: 2.03 cm MV A velocity: 55.10 cm/s MV E/A ratio:  0.78  Sunit Tolia Electronically signed by Tessa Lerner Signature Date/Time: 03/08/2023/11:31:51 AM    Final   TEE  ECHO TEE 12/04/2021  Narrative TRANSESOPHOGEAL ECHO REPORT    Patient Name:   Juwaun Omary  Date of Exam: 12/04/2021 Medical  Rec #:  [id number]  Height:       70.0 in Accession #:    [id number] Weight:       271.4 lb Date of Birth:  May 20, 1970 BSA:          2.376 m Patient Age:    50 years   BP:           109/93 mmHg Patient Gender: M          HR:           87 bpm. Exam Location:  Inpatient  Procedure: 3D Echo, Transesophageal Echo, Color Doppler and Saline Contrast Bubble Study  Indications:     Stroke  History:         Patient has prior history of Echocardiogram examinations, most recent 12/02/2021. Abnormal ECG, Stroke, Pulmonic Valve Disease, Signs/Symptoms:Bacteremia; Risk Factors:Current Smoker.  Sonographer:     Sheralyn Boatman RDCS Referring Phys:  Perlie Gold Diagnosing Phys: Jodelle Red MD  PROCEDURE: After discussion of the risks and benefits of a TEE, an informed consent was  obtained from the patient. The transesophogeal probe was passed without difficulty through the esophogus of the patient. Imaged were obtained with the patient in a left lateral decubitus position. Sedation performed by different physician. The patient was monitored while under deep sedation. Anesthestetic sedation was provided intravenously by Anesthesiology: 305mg  of Propofol, 60mg  of Lidocaine. Image quality was good. The patient's vital signs; including heart rate, blood pressure, and oxygen saturation; remained stable throughout the procedure. The patient developed no complications during the procedure.  IMPRESSIONS   1. Left ventricular ejection fraction, by estimation, is 60 to 65%. The left ventricle has normal function. The left ventricle has no regional wall motion abnormalities. 2. Right ventricular systolic function is normal. The right ventricular size is normal. 3. No left atrial/left atrial appendage thrombus was detected. 4. A small pericardial effusion is present. 5. The mitral valve is abnormal. Trivial mitral valve regurgitation. No evidence of mitral stenosis. 6. The aortic valve is tricuspid. Aortic valve regurgitation is not visualized. No aortic stenosis is present. 7. There is mild (Grade II) plaque involving the descending aorta. 8. Evidence of atrial level shunting detected by color flow Doppler. Agitated saline contrast bubble study was negative, with no evidence of any interatrial shunt. There is a small patent foramen ovale with bidirectional shunting across atrial septum.  Conclusion(s)/Recommendation(s): Findings are concerning for an interatrial shunt as detailed above.  FINDINGS Left Ventricle: Left ventricular ejection fraction, by estimation, is 60 to 65%. The left ventricle has normal function. The left ventricle has no regional wall motion abnormalities. The left ventricular internal cavity size was normal in size.  Right Ventricle: The right ventricular size  is normal. No increase in right ventricular wall thickness. Right ventricular systolic function is normal.  Left Atrium: Left atrial size was normal in size. No left atrial/left atrial appendage thrombus was detected.  Right Atrium: Right atrial size was normal in size.  Pericardium: A small pericardial effusion is present.  Mitral Valve: Bowing of posterior leaflet without frank prolapse. The mitral valve is abnormal. Trivial mitral valve regurgitation. No evidence of mitral valve stenosis.  Tricuspid Valve: The tricuspid valve is normal in structure. Tricuspid valve regurgitation is trivial. No evidence of tricuspid stenosis.  Aortic Valve: The aortic valve is tricuspid. Aortic valve regurgitation is not visualized. No aortic stenosis is present.  Pulmonic Valve: The pulmonic valve was grossly normal. Pulmonic valve regurgitation is trivial. No evidence of pulmonic stenosis.  Aorta: The aortic root is normal in size and structure. There is mild (Grade II) plaque involving the descending aorta.  IAS/Shunts: The interatrial septum is aneurysmal. Evidence of atrial level shunting detected by color flow Doppler. Agitated saline contrast was given intravenously to evaluate for intracardiac shunting. Agitated saline contrast bubble study was negative, with no evidence of any interatrial shunt. A small patent foramen ovale is detected with bidirectional shunting across atrial septum.  Jodelle Red MD Electronically signed by Jodelle Red MD Signature Date/Time: 12/22/2021/2:21:46 PM    Final  MONITORS  LONG TERM MONITOR (3-14 DAYS) 04/14/2022  Narrative Patch Wear Time:  13 days and 21 hours (2024-02-16T06:15:56-0500 to 2024-03-01T03:56:51-0500)  Patient had a min HR of 53 bpm, max HR of 226 bpm, and avg HR of 92 bpm. Predominant underlying rhythm was Sinus Rhythm.  EVENTS: -13 Supraventricular Tachycardia runs occurred, the run with the fastest interval lasting 8.6  secs with a max rate of 226 bpm, the longest lasting 15.7 secs with an avg rate of 127 bpm. -Isolated SVEs were rare (<1.0%), SVE Couplets were rare (<1.0%), and SVE Triplets were rare (<1.0%). -Isolated VEs were rare (<1.0%), VE Couplets were rare (<1.0%), and no VE Triplets were present.  Ventricular Trigeminy was present.  No atrial fibrillation, sustained ventricular tachyarrhythmias, or bradyarrhythmias were detected.  Patient triggered events corresponded with sinus rhythm.       ______________________________________________________________________________________________       Labs/Other Tests and Data Reviewed:    EKG:  No ECG reviewed.  Recent Labs: 06/04/2022: Magnesium 1.8 01/22/2023: ALT 21; BUN 13; Creatinine 0.94; Hemoglobin 14.4; Platelet Count 214; Potassium 3.9; Sodium 141   Recent Lipid Panel Lab Results  Component Value Date/Time   CHOL 132 12/02/2021 04:23 AM   CHOL 171 12/18/2020 08:17 AM   TRIG 92 12/02/2021 04:23 AM   HDL 37 (L) 12/02/2021 04:23 AM   HDL 42 12/18/2020 08:17 AM   CHOLHDL 3.6 12/02/2021 04:23 AM   LDLCALC 77 12/02/2021 04:23 AM   LDLCALC 110 (H) 12/18/2020 08:17 AM   Wt Readings from Last 3 Encounters:  03/17/23 264 lb (119.7 kg)  03/08/23 262 lb 6.4 oz (119 kg)  01/22/23 265 lb (120.2 kg)    Objective:    Vital Signs:  BP 124/82   Pulse 95   Ht 5' 11.75" (1.822 m)   Wt 264 lb (119.7 kg)   BMI 36.05 kg/m    VITAL SIGNS:  reviewed GEN:  no acute distress  ASSESSMENT & PLAN:    PFO: s/p PFO closure 03/06/22 with 30mm Gore Cardioform device. Continue ASA 81mg  indefinitely. No longer requires dental SBE. Echo with bubble with no intraatrial shunting and negative bubble with well seated PFO closure device. Continue DAPT with ASA and Plavix x 6 months post implant. Dental SBE discussed and patient will defer cleanings until after 6 month post implant.    Palpitations: Post op ZIO with intermittent SVT and underlying SR. No recurrence.  Suspect from cardiac manipulation from PFO closure. No recurrence.    Hypotension: BP and orthostatic symptoms improved on follow up today at 124/82. Will continue regimen without Toprol for now.    Stroke: No residual noted. Continues to follow with neurology. Continue ASA, statin.  Time:   Today, I have spent 15 minutes with the patient with telehealth technology discussing the above problems.     Medication Adjustments/Labs and Tests Ordered: Current medicines are reviewed at length with the patient today.  Concerns regarding medicines are outlined  above.   Tests Ordered: No orders of the defined types were placed in this encounter.   Medication Changes: No orders of the defined types were placed in this encounter.   Follow Up:  In Person  6-12 months with structural heart team   Signed, Georgie Chard, NP  03/17/2023 8:09 AM    [facility]

## 2023-03-13 IMAGING — CT NM PET TUM IMG INITIAL (PI) SKULL BASE T - THIGH
1 of 7 series · 1 of 25 positions shown · non-contrast
Comparison: 05/13/2020

CLINICAL DATA: Subsequent treatment strategy for non-Hodgkin's
lymphoma.

EXAM:
NUCLEAR MEDICINE PET SKULL BASE TO THIGH
TECHNIQUE: 13.2 mCi F-18 FDG was injected intravenously. Full-ring PET imaging
was performed from the skull base to thigh after the radiotracer. CT
data was obtained and used for attenuation correction and anatomic
localization.
Fasting blood glucose: 142 mg/dl

[Series 4: ct sk_thigh 5.0 hd_fov · axial · 5.0mm · 1.17mm/px · 1 of 245 slices shown]
[im 245/245  brain]
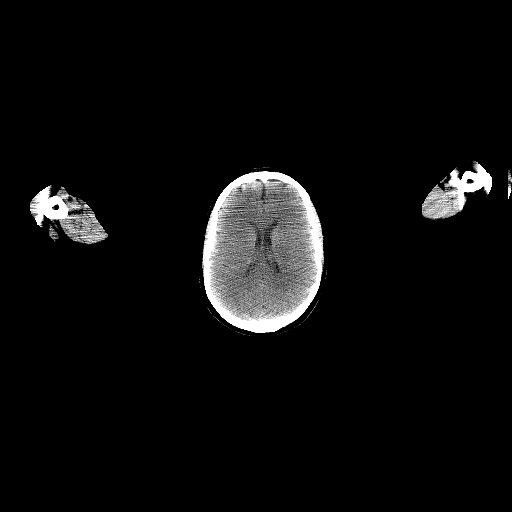

[1 of 25 positions shown; findings below may reference images not displayed]

FINDINGS: Mediastinal blood pool activity: SUV max

Liver activity: SUV max

NECK: No hypermetabolic lymph nodes in the neck.

Incidental CT findings: none

CHEST: No hypermetabolic mediastinal or hilar nodes. No suspicious
pulmonary nodules on the CT scan.

Incidental CT findings: Aortic atherosclerosis. Coronary artery
calcification. Multiple calcified mediastinal and hilar lymph nodes.
Calcified granuloma identified within the left lower lobe.
Paraseptal emphysema

ABDOMEN/PELVIS: No abnormal hypermetabolic activity within the
liver, pancreas, adrenal glands, or spleen. No hypermetabolic lymph
nodes in the abdomen or pelvis.

Incidental CT findings: Aortic atherosclerosis. Previous
appendectomy. Surgical clips are again seen within bilateral
inguinal regions.

SKELETON: No focal hypermetabolic activity to suggest skeletal
metastasis.

Incidental CT findings: None
IMPRESSION: 1. No signs of residual or recurrent FDG avid tumor.
2. Aortic Atherosclerosis (444BJ-USM.M) and Emphysema (444BJ-DQ0.W).

## 2023-03-17 ENCOUNTER — Ambulatory Visit: Payer: BC Managed Care – PPO | Attending: Cardiology | Admitting: Cardiology

## 2023-03-17 ENCOUNTER — Encounter: Payer: Self-pay | Admitting: Cardiology

## 2023-03-17 ENCOUNTER — Telehealth: Payer: Self-pay | Admitting: *Deleted

## 2023-03-17 VITALS — BP 124/82 | HR 95 | Ht 71.75 in | Wt 264.0 lb

## 2023-03-17 DIAGNOSIS — Q2112 Patent foramen ovale: Secondary | ICD-10-CM

## 2023-03-17 DIAGNOSIS — I63432 Cerebral infarction due to embolism of left posterior cerebral artery: Secondary | ICD-10-CM | POA: Diagnosis not present

## 2023-03-17 DIAGNOSIS — E119 Type 2 diabetes mellitus without complications: Secondary | ICD-10-CM

## 2023-03-17 DIAGNOSIS — I951 Orthostatic hypotension: Secondary | ICD-10-CM | POA: Diagnosis not present

## 2023-03-17 DIAGNOSIS — I950 Idiopathic hypotension: Secondary | ICD-10-CM

## 2023-03-17 DIAGNOSIS — I10 Essential (primary) hypertension: Secondary | ICD-10-CM

## 2023-03-17 NOTE — Telephone Encounter (Signed)
  Patient Consent for Virtual Visit        James Jennings has provided verbal consent on 03/17/2023 for a virtual visit (video or telephone).   CONSENT FOR VIRTUAL VISIT FOR:  James Jennings  By participating in this virtual visit I agree to the following:  I hereby voluntarily request, consent and authorize [facility] and its employed or contracted physicians, physician assistants, nurse practitioners or other licensed health care professionals (the Practitioner), to provide me with telemedicine health care services (the "Services") as deemed necessary by the treating Practitioner. I acknowledge and consent to receive the Services by the Practitioner via telemedicine. I understand that the telemedicine visit will involve communicating with the Practitioner through live audiovisual communication technology and the disclosure of certain medical information by electronic transmission. I acknowledge that I have been given the opportunity to request an in-person assessment or other available alternative prior to the telemedicine visit and am voluntarily participating in the telemedicine visit.  I understand that I have the right to withhold or withdraw my consent to the use of telemedicine in the course of my care at any time, without affecting my right to future care or treatment, and that the Practitioner or I may terminate the telemedicine visit at any time. I understand that I have the right to inspect all information obtained and/or recorded in the course of the telemedicine visit and may receive copies of available information for a reasonable fee.  I understand that some of the potential risks of receiving the Services via telemedicine include:  Delay or interruption in medical evaluation due to technological equipment failure or disruption; Information transmitted may not be sufficient (e.g. poor resolution of images) to allow for appropriate medical decision making by the Practitioner; and/or   In rare instances, security protocols could fail, causing a breach of personal health information.  Furthermore, I acknowledge that it is my responsibility to provide information about my medical history, conditions and care that is complete and accurate to the best of my ability. I acknowledge that Practitioner's advice, recommendations, and/or decision may be based on factors not within their control, such as incomplete or inaccurate data provided by me or distortions of diagnostic images or specimens that may result from electronic transmissions. I understand that the practice of medicine is not an exact science and that Practitioner makes no warranties or guarantees regarding treatment outcomes. I acknowledge that a copy of this consent can be made available to me via my patient portal [facility] MyChart), or I can request a printed copy by calling the office of [facility].    I understand that my insurance will be billed for this visit.   I have read or had this consent read to me. I understand the contents of this consent, which adequately explains the benefits and risks of the Services being provided via telemedicine.  I have been provided ample opportunity to ask questions regarding this consent and the Services and have had my questions answered to my satisfaction. I give my informed consent for the services to be provided through the use of telemedicine in my medical care

## 2023-03-25 DIAGNOSIS — G4733 Obstructive sleep apnea (adult) (pediatric): Secondary | ICD-10-CM | POA: Diagnosis not present

## 2023-03-30 DIAGNOSIS — Z23 Encounter for immunization: Secondary | ICD-10-CM | POA: Diagnosis not present

## 2023-03-30 DIAGNOSIS — E119 Type 2 diabetes mellitus without complications: Secondary | ICD-10-CM | POA: Diagnosis not present

## 2023-04-12 ENCOUNTER — Inpatient Hospital Stay: Payer: BC Managed Care – PPO | Attending: Hematology & Oncology

## 2023-04-12 ENCOUNTER — Inpatient Hospital Stay: Payer: BC Managed Care – PPO

## 2023-04-12 ENCOUNTER — Telehealth: Payer: Self-pay

## 2023-04-12 ENCOUNTER — Inpatient Hospital Stay (HOSPITAL_BASED_OUTPATIENT_CLINIC_OR_DEPARTMENT_OTHER): Payer: BC Managed Care – PPO | Admitting: Hematology & Oncology

## 2023-04-12 ENCOUNTER — Other Ambulatory Visit: Payer: Self-pay

## 2023-04-12 ENCOUNTER — Encounter: Payer: Self-pay | Admitting: Hematology & Oncology

## 2023-04-12 VITALS — BP 111/72 | HR 79 | Temp 98.7°F | Resp 20 | Ht 71.0 in | Wt 268.0 lb

## 2023-04-12 DIAGNOSIS — Z452 Encounter for adjustment and management of vascular access device: Secondary | ICD-10-CM | POA: Insufficient documentation

## 2023-04-12 DIAGNOSIS — C8223 Follicular lymphoma grade III, unspecified, intra-abdominal lymph nodes: Secondary | ICD-10-CM | POA: Insufficient documentation

## 2023-04-12 DIAGNOSIS — D801 Nonfamilial hypogammaglobulinemia: Secondary | ICD-10-CM | POA: Insufficient documentation

## 2023-04-12 DIAGNOSIS — Z9221 Personal history of antineoplastic chemotherapy: Secondary | ICD-10-CM | POA: Diagnosis not present

## 2023-04-12 LAB — CMP (CANCER CENTER ONLY)
ALT: 19 U/L (ref 0–44)
AST: 18 U/L (ref 15–41)
Albumin: 4.4 g/dL (ref 3.5–5.0)
Alkaline Phosphatase: 90 U/L (ref 38–126)
Anion gap: 8 (ref 5–15)
BUN: 13 mg/dL (ref 6–20)
CO2: 28 mmol/L (ref 22–32)
Calcium: 9 mg/dL (ref 8.9–10.3)
Chloride: 103 mmol/L (ref 98–111)
Creatinine: 0.95 mg/dL (ref 0.61–1.24)
GFR, Estimated: >60 mL/min (ref >60)
Glucose, Bld: 124 mg/dL — ABNORMAL HIGH (ref 70–99)
Potassium: 3.9 mmol/L (ref 3.5–5.1)
Sodium: 139 mmol/L (ref 135–145)
Total Bilirubin: 0.6 mg/dL (ref 0.0–1.2)
Total Protein: 6.7 g/dL (ref 6.5–8.1)

## 2023-04-12 LAB — CBC WITH DIFFERENTIAL (CANCER CENTER ONLY)
Abs Immature Granulocytes: 0.04 10*3/uL (ref 0.00–0.07)
Basophils Absolute: 0 10*3/uL (ref 0.0–0.1)
Basophils Relative: 0 %
Eosinophils Absolute: 0.1 10*3/uL (ref 0.0–0.5)
Eosinophils Relative: 2 %
HCT: 43.2 % (ref 39.0–52.0)
Hemoglobin: 14.6 g/dL (ref 13.0–17.0)
Immature Granulocytes: 1 %
Lymphocytes Relative: 17 %
Lymphs Abs: 1.1 10*3/uL (ref 0.7–4.0)
MCH: 31 pg (ref 26.0–34.0)
MCHC: 33.8 g/dL (ref 30.0–36.0)
MCV: 91.7 fL (ref 80.0–100.0)
Monocytes Absolute: 0.7 10*3/uL (ref 0.1–1.0)
Monocytes Relative: 10 %
Neutro Abs: 4.9 10*3/uL (ref 1.7–7.7)
Neutrophils Relative %: 70 %
Platelet Count: 213 10*3/uL (ref 150–400)
RBC: 4.71 MIL/uL (ref 4.22–5.81)
RDW: 12.5 % (ref 11.5–15.5)
WBC Count: 6.9 10*3/uL (ref 4.0–10.5)
nRBC: 0 % (ref 0.0–0.2)

## 2023-04-12 LAB — LACTATE DEHYDROGENASE: LDH: 144 U/L (ref 98–192)

## 2023-04-12 MED ORDER — SODIUM CHLORIDE 0.9% FLUSH
10.0000 mL | INTRAVENOUS | Status: DC | PRN
  Administered 2023-04-12: 10 mL via INTRAVENOUS

## 2023-04-12 MED ORDER — HEPARIN SOD (PORK) LOCK FLUSH 100 UNIT/ML IV SOLN
500.0000 [IU] | Freq: Once | INTRAVENOUS | Status: AC
  Administered 2023-04-12: 500 [IU] via INTRAVENOUS

## 2023-04-12 NOTE — Patient Instructions (Signed)

## 2023-04-12 NOTE — Telephone Encounter (Signed)
 Advised via MyChart.

## 2023-04-12 NOTE — Telephone Encounter (Signed)
-----   Message from James Jennings sent at 04/12/2023  2:20 PM EDT ----- Please call and let him know that the chemistries all look pretty good.  His blood sugar is only 124.  Good job.  [name]

## 2023-04-12 NOTE — Progress Notes (Signed)
 Hematology and Oncology Follow Up Visit  Anuj Summons [id number] May 26, 1970 53 y.o. 04/12/2023   Principle Diagnosis:  Follicular B- cell non-Hodgkin's lymphoma - Relapsed Hypogammaglobulinemia   Past Therapy:             Rituxan/bendamustine-s/p cycle 6 - completed on 07/03/2016   Current Therapy:  Gazyva  - Cytoxan/ Vincristine/Prednisone  - started 11/07/2019, s/p cycle #4 --  D/c on 03/26/2020 G-CHOP -- s/p cycle #6-- started on 04/03/2020 -completed on 07/13/2020 Maintenance Gazyva-s/p cycle 10/12 --  start on 09/05/2020 --completed on 07/27/2022    Interim History:  Mr. Rickett is here today for follow-up.  He is looking quite good.  I am happy for him.  I know he has been quite busy.  Looks like he is lost a little bit of weight.  He had been on Ozempic.  He and his family had just gotten back from [place].  They are down there for a little bit of a vacation.  It sounds like had a wonderful time.  Thankfully, he is off blood thinner now.  He had a follow-up echocardiogram which showed closure of the PFO.  He has had no problems with swollen lymph nodes.  He has had no rashes.  He has had no change in bowel or bladder habits.  He has had no leg swelling.  He is due for a PET scan later this month.  Currently, I would say that his performance status is probably ECOG 0.    Medications:  Allergies as of 04/12/2023       Reactions   Mango Flavoring Agent (non-screening) Swelling, Other (See Comments)   LIPS SWELL   Rituximab Other (See Comments)   Blood pressure went extremely low.    Shellfish Allergy Anaphylaxis   Lactose Intolerance (gi) Diarrhea        Medication List        Accurate as of April 12, 2023  1:35 PM. If you have any questions, ask your nurse or doctor.          acetaminophen 325 MG tablet Commonly known as: TYLENOL Take 650 mg by mouth daily as needed for moderate pain or headache.   ALPRAZolam 0.25 MG tablet Commonly known as: XANAX Take 0.25 mg  by mouth 2 (two) times daily as needed for anxiety.   aspirin EC 81 MG tablet Take 1 tablet (81 mg total) by mouth daily. Swallow whole.   cetirizine 10 MG tablet Commonly known as: ZYRTEC Take 10 mg by mouth daily.   escitalopram 5 MG tablet Commonly known as: LEXAPRO Take 5 mg by mouth daily.   Farxiga 10 MG Tabs tablet Generic drug: dapagliflozin propanediol Take 1 tablet (10 mg total) by mouth daily.   ipratropium 0.03 % nasal spray Commonly known as: ATROVENT Place 2 sprays into both nostrils 2 (two) times daily.   lidocaine-prilocaine cream Commonly known as: EMLA Apply 1 Application topically as needed. Apply to [facility] 1 hour prior to procedure.   metFORMIN 500 MG tablet Commonly known as: GLUCOPHAGE Take 500 mg by mouth daily.   ondansetron 4 MG disintegrating tablet Commonly known as: ZOFRAN-ODT Take 4 mg by mouth as needed.   Ozempic (2 MG/DOSE) 8 MG/3ML Sopn Generic drug: Semaglutide (2 MG/DOSE) Inject 2 mg into the skin once a week.   rosuvastatin 40 MG tablet Commonly known as: CRESTOR TAKE 1 TABLET BY MOUTH EVERY DAY        Allergies:  Allergies  Allergen Reactions   Mango  Flavoring Agent (Non-Screening) Swelling and Other (See Comments)    LIPS SWELL   Rituximab Other (See Comments)    Blood pressure went extremely low.    Shellfish Allergy Anaphylaxis   Lactose Intolerance (Gi) Diarrhea    Past Medical History, Surgical history, Social history, and Family History were reviewed and updated.  Review of Systems: Review of Systems  Constitutional: Negative.   HENT: Negative.    Eyes: Negative.   Respiratory: Negative.    Cardiovascular: Negative.   Gastrointestinal: Negative.   Genitourinary: Negative.   Musculoskeletal: Negative.   Skin: Negative.   Neurological: Negative.   Endo/Heme/Allergies: Negative.   Psychiatric/Behavioral: Negative.       Physical Exam:  height is 5\' 11"  (1.803 m) and weight is 268 lb (121.6 kg). His oral  temperature is 98.7 F (37.1 C). His blood pressure is 111/72 and his pulse is 79. His respiration is 20 and oxygen saturation is 100%.   Wt Readings from Last 3 Encounters:  04/12/23 268 lb (121.6 kg)  03/17/23 264 lb (119.7 kg)  03/08/23 262 lb 6.4 oz (119 kg)    His vital signs are temperature of 97.8.  Pulse 82.  Blood pressure 111/69.  Weight is 276 pounds.   Physical Exam Vitals reviewed.  HENT:     Head: Normocephalic and atraumatic.  Eyes:     Pupils: Pupils are equal, round, and reactive to light.  Cardiovascular:     Rate and Rhythm: Normal rate and regular rhythm.     Heart sounds: Normal heart sounds.  Pulmonary:     Effort: Pulmonary effort is normal.     Breath sounds: Normal breath sounds.  Abdominal:     General: Bowel sounds are normal.     Palpations: Abdomen is soft.  Musculoskeletal:        General: No tenderness or deformity. Normal range of motion.     Cervical back: Normal range of motion.  Lymphadenopathy:     Cervical: No cervical adenopathy.  Skin:    General: Skin is warm and dry.     Findings: No erythema or rash.  Neurological:     Mental Status: He is alert and oriented to person, place, and time.  Psychiatric:        Behavior: Behavior normal.        Thought Content: Thought content normal.        Judgment: Judgment normal.      Lab Results  Component Value Date   WBC 6.9 04/12/2023   HGB 14.6 04/12/2023   HCT 43.2 04/12/2023   MCV 91.7 04/12/2023   PLT 213 04/12/2023   Lab Results  Component Value Date   FERRITIN 55 09/28/2022   IRON 84 09/28/2022   TIBC 347 09/28/2022   UIBC 263 09/28/2022   IRONPCTSAT 24 09/28/2022   Lab Results  Component Value Date   RETICCTPCT 1.07 12/06/2015   RBC 4.71 04/12/2023   RETICCTABS 48.79 12/06/2015   Lab Results  Component Value Date   KPAFRELGTCHN 11.7 11/30/2022   LAMBDASER 7.7 11/30/2022   KAPLAMBRATIO 1.52 11/30/2022   Lab Results  Component Value Date   IGGSERUM 542 (L)  01/22/2023   IGA 116 01/22/2023   IGMSERUM 11 (L) 01/22/2023   Lab Results  Component Value Date   TOTALPROTELP 5.9 (L) 11/30/2022   ALBUMINELP 3.6 11/30/2022   A1GS 0.2 11/30/2022   A2GS 0.8 11/30/2022   BETS 0.8 11/30/2022   GAMS 0.5 11/30/2022   MSPIKE Not Observed  11/30/2022     Chemistry      Component Value Date/Time   NA 139 04/12/2023 1145   NA 143 02/04/2022 1506   NA 141 12/03/2016 1155   NA 141 02/07/2016 1149   K 3.9 04/12/2023 1145   K 3.8 12/03/2016 1155   K 4.3 02/07/2016 1149   CL 103 04/12/2023 1145   CL 100 12/03/2016 1155   CO2 28 04/12/2023 1145   CO2 27 12/03/2016 1155   CO2 27 02/07/2016 1149   BUN 13 04/12/2023 1145   BUN 11 02/04/2022 1506   BUN 10 12/03/2016 1155   BUN 10.9 02/07/2016 1149   CREATININE 0.95 04/12/2023 1145   CREATININE 1.0 12/03/2016 1155   CREATININE 0.9 02/07/2016 1149      Component Value Date/Time   CALCIUM 9.0 04/12/2023 1145   CALCIUM 9.1 12/03/2016 1155   CALCIUM 9.6 02/07/2016 1149   ALKPHOS 90 04/12/2023 1145   ALKPHOS 99 (H) 12/03/2016 1155   ALKPHOS 127 02/07/2016 1149   AST 18 04/12/2023 1145   AST 17 02/07/2016 1149   ALT 19 04/12/2023 1145   ALT 24 12/03/2016 1155   ALT 16 02/07/2016 1149   BILITOT 0.6 04/12/2023 1145   BILITOT 0.42 02/07/2016 1149       Impression and Plan: Mr. Lindahl is a pleasant 53 yo caucasian gentleman with history of follicular large cell non-Hodgkin's lymphoma. He completed 6 cycles of chemotherapy with bendamustine on 07/03/2016 but was not tolerant of Rituxan (anaphylaxis). His lymphoma has  recurred.  We then treated him with CHOP.  This got him into remission.  He has completed all of his treatment for the recurrent follicular large cell lymphoma.  I think that if this does come back again, he will probably be a good candidate for CAR-T therapy.  We will see what the PET scan shows.  I have to leave that he is going to be in remission.  For right now, we will plan to get him  back in 3 months.  If all looks good in 3 months, then we will deftly get him through the Summer.  He still has his Port-A-Cath in.  I will have to make sure that we flush this in 6 weeks.   Josph Macho, MD 3/10/20251:35 PM

## 2023-04-14 ENCOUNTER — Other Ambulatory Visit: Payer: Self-pay

## 2023-04-20 DIAGNOSIS — R0981 Nasal congestion: Secondary | ICD-10-CM | POA: Diagnosis not present

## 2023-04-20 DIAGNOSIS — U071 COVID-19: Secondary | ICD-10-CM | POA: Diagnosis not present

## 2023-04-22 ENCOUNTER — Ambulatory Visit (HOSPITAL_COMMUNITY): Payer: BC Managed Care – PPO

## 2023-04-22 DIAGNOSIS — G4733 Obstructive sleep apnea (adult) (pediatric): Secondary | ICD-10-CM | POA: Diagnosis not present

## 2023-04-30 ENCOUNTER — Other Ambulatory Visit: Payer: Self-pay | Admitting: Internal Medicine

## 2023-05-03 ENCOUNTER — Ambulatory Visit (HOSPITAL_COMMUNITY)
Admission: RE | Admit: 2023-05-03 | Discharge: 2023-05-03 | Disposition: A | Discharge status: Home / Self Care | Source: Ambulatory Visit | Attending: Hematology & Oncology | Admitting: Hematology & Oncology

## 2023-05-03 DIAGNOSIS — R935 Abnormal findings on diagnostic imaging of other abdominal regions, including retroperitoneum: Secondary | ICD-10-CM | POA: Diagnosis not present

## 2023-05-03 DIAGNOSIS — C8223 Follicular lymphoma grade III, unspecified, intra-abdominal lymph nodes: Secondary | ICD-10-CM | POA: Insufficient documentation

## 2023-05-03 LAB — GLUCOSE, CAPILLARY: Glucose-Capillary: 122 mg/dL — ABNORMAL HIGH (ref 70–99)

## 2023-05-03 MED ORDER — FLUDEOXYGLUCOSE F - 18 (FDG) INJECTION
13.8000 | Freq: Once | INTRAVENOUS | Status: AC
  Administered 2023-05-03: 12.94 via INTRAVENOUS

## 2023-05-03 MED ORDER — HEPARIN SOD (PORK) LOCK FLUSH 100 UNIT/ML IV SOLN
500.0000 [IU] | Freq: Once | INTRAVENOUS | Status: AC
  Administered 2023-05-03: 500 [IU] via INTRAVENOUS
  Filled 2023-05-03: qty 5

## 2023-05-23 DIAGNOSIS — G4733 Obstructive sleep apnea (adult) (pediatric): Secondary | ICD-10-CM | POA: Diagnosis not present

## 2023-05-25 ENCOUNTER — Inpatient Hospital Stay: Attending: Hematology & Oncology

## 2023-05-25 VITALS — BP 118/65 | HR 90 | Temp 98.2°F | Resp 18 | Wt 268.0 lb

## 2023-05-25 DIAGNOSIS — C8223 Follicular lymphoma grade III, unspecified, intra-abdominal lymph nodes: Secondary | ICD-10-CM | POA: Diagnosis not present

## 2023-05-25 DIAGNOSIS — D801 Nonfamilial hypogammaglobulinemia: Secondary | ICD-10-CM | POA: Diagnosis not present

## 2023-05-25 DIAGNOSIS — Z452 Encounter for adjustment and management of vascular access device: Secondary | ICD-10-CM | POA: Diagnosis not present

## 2023-05-25 DIAGNOSIS — Z95828 Presence of other vascular implants and grafts: Secondary | ICD-10-CM

## 2023-05-25 MED ORDER — SODIUM CHLORIDE 0.9% FLUSH
10.0000 mL | Freq: Once | INTRAVENOUS | Status: AC
  Administered 2023-05-25: 10 mL via INTRAVENOUS

## 2023-05-25 MED ORDER — HEPARIN SOD (PORK) LOCK FLUSH 100 UNIT/ML IV SOLN
500.0000 [IU] | Freq: Once | INTRAVENOUS | Status: AC
  Administered 2023-05-25: 500 [IU] via INTRAVENOUS

## 2023-06-22 ENCOUNTER — Encounter: Payer: Self-pay | Admitting: Hematology & Oncology

## 2023-06-22 DIAGNOSIS — G4733 Obstructive sleep apnea (adult) (pediatric): Secondary | ICD-10-CM | POA: Diagnosis not present

## 2023-06-25 ENCOUNTER — Telehealth: Payer: Self-pay | Admitting: Neurology

## 2023-06-25 NOTE — Telephone Encounter (Signed)
 rs appointment

## 2023-06-29 ENCOUNTER — Ambulatory Visit: Payer: BC Managed Care – PPO | Admitting: Neurology

## 2023-07-07 ENCOUNTER — Other Ambulatory Visit: Payer: Self-pay

## 2023-07-07 ENCOUNTER — Encounter: Payer: Self-pay | Admitting: Hematology & Oncology

## 2023-07-07 ENCOUNTER — Inpatient Hospital Stay (HOSPITAL_BASED_OUTPATIENT_CLINIC_OR_DEPARTMENT_OTHER): Admitting: Hematology & Oncology

## 2023-07-07 ENCOUNTER — Inpatient Hospital Stay: Attending: Hematology & Oncology

## 2023-07-07 ENCOUNTER — Inpatient Hospital Stay

## 2023-07-07 VITALS — BP 122/81 | HR 81 | Temp 97.9°F | Resp 18 | Ht 71.0 in | Wt 268.0 lb

## 2023-07-07 DIAGNOSIS — Z452 Encounter for adjustment and management of vascular access device: Secondary | ICD-10-CM | POA: Diagnosis not present

## 2023-07-07 DIAGNOSIS — D801 Nonfamilial hypogammaglobulinemia: Secondary | ICD-10-CM | POA: Insufficient documentation

## 2023-07-07 DIAGNOSIS — C8223 Follicular lymphoma grade III, unspecified, intra-abdominal lymph nodes: Secondary | ICD-10-CM

## 2023-07-07 DIAGNOSIS — Z95828 Presence of other vascular implants and grafts: Secondary | ICD-10-CM

## 2023-07-07 LAB — CMP (CANCER CENTER ONLY)
ALT: 18 U/L (ref 0–44)
AST: 18 U/L (ref 15–41)
Albumin: 4.1 g/dL (ref 3.5–5.0)
Alkaline Phosphatase: 103 U/L (ref 38–126)
Anion gap: 7 (ref 5–15)
BUN: 10 mg/dL (ref 6–20)
CO2: 29 mmol/L (ref 22–32)
Calcium: 8.7 mg/dL — ABNORMAL LOW (ref 8.9–10.3)
Chloride: 105 mmol/L (ref 98–111)
Creatinine: 0.89 mg/dL (ref 0.61–1.24)
GFR, Estimated: >60 mL/min (ref >60)
Glucose, Bld: 97 mg/dL (ref 70–99)
Potassium: 3.8 mmol/L (ref 3.5–5.1)
Sodium: 141 mmol/L (ref 135–145)
Total Bilirubin: 0.6 mg/dL (ref 0.0–1.2)
Total Protein: 6.3 g/dL — ABNORMAL LOW (ref 6.5–8.1)

## 2023-07-07 LAB — CBC WITH DIFFERENTIAL (CANCER CENTER ONLY)
Abs Immature Granulocytes: 0.02 10*3/uL (ref 0.00–0.07)
Basophils Absolute: 0 10*3/uL (ref 0.0–0.1)
Basophils Relative: 1 %
Eosinophils Absolute: 0.1 10*3/uL (ref 0.0–0.5)
Eosinophils Relative: 2 %
HCT: 39.9 % (ref 39.0–52.0)
Hemoglobin: 13.7 g/dL (ref 13.0–17.0)
Immature Granulocytes: 0 %
Lymphocytes Relative: 21 %
Lymphs Abs: 1.1 10*3/uL (ref 0.7–4.0)
MCH: 31.1 pg (ref 26.0–34.0)
MCHC: 34.3 g/dL (ref 30.0–36.0)
MCV: 90.5 fL (ref 80.0–100.0)
Monocytes Absolute: 0.5 10*3/uL (ref 0.1–1.0)
Monocytes Relative: 9 %
Neutro Abs: 3.6 10*3/uL (ref 1.7–7.7)
Neutrophils Relative %: 67 %
Platelet Count: 215 10*3/uL (ref 150–400)
RBC: 4.41 MIL/uL (ref 4.22–5.81)
RDW: 13.4 % (ref 11.5–15.5)
WBC Count: 5.4 10*3/uL (ref 4.0–10.5)
nRBC: 0 % (ref 0.0–0.2)

## 2023-07-07 LAB — LACTATE DEHYDROGENASE: LDH: 128 U/L (ref 98–192)

## 2023-07-07 MED ORDER — HEPARIN SOD (PORK) LOCK FLUSH 100 UNIT/ML IV SOLN
500.0000 [IU] | Freq: Once | INTRAVENOUS | Status: AC
  Administered 2023-07-07: 500 [IU] via INTRAVENOUS

## 2023-07-07 MED ORDER — SODIUM CHLORIDE 0.9% FLUSH
10.0000 mL | INTRAVENOUS | Status: DC | PRN
  Administered 2023-07-07: 10 mL via INTRAVENOUS

## 2023-07-07 NOTE — Progress Notes (Signed)
 Hematology and Oncology Follow Up Visit  James Jennings [id number] [birth year] 53 y.o. 07/07/2023   Principle Diagnosis:  Follicular B- cell non-Hodgkin's lymphoma - Relapsed Hypogammaglobulinemia   Past Therapy:             Rituxan /bendamustine -s/p cycle 6 - completed on 07/03/2016   Current Therapy:  Gazyva   - Cytoxan / Vincristine /Prednisone   - started 11/07/2019, s/p cycle #4 --  D/c on 03/26/2020 G-CHOP -- s/p cycle #6-- started on 04/03/2020 -completed on 07/13/2020 Maintenance Gazyva -s/p cycle 10/12 --  start on 09/05/2020 --completed on 07/27/2022    Interim History:  James Jennings is here today for follow-up.  So far, everything is going quite well for him.  He really has had no complaints.  He has had no problems with abdominal pain.  He has had some issues with sinuses.  However, this does not appear to be a sinus infection..  He has last PET scan the end of March.  Everything looked fine on the PET scan without any evidence of recurrent lymphoma.  He has had no problems with cough or shortness of breath.  He has had no nausea or vomiting.  Had no change in bowel or bladder habits.  He has not really lost much weight.  I think he has been on Ozempic ..  He has had no leg swelling.  He has had no rashes.  Overall, I would have to say that his performance status for now is probably ECOG 0.     Medications:  Allergies as of 07/07/2023       Reactions   Mango Flavoring Agent (non-screening) Swelling, Other (See Comments)   LIPS SWELL   Rituximab  Other (See Comments)   Blood pressure went extremely low.    Shellfish Allergy Anaphylaxis   Lactose Intolerance (gi) Diarrhea        Medication List        Accurate as of July 07, 2023  1:12 PM. If you have any questions, ask your nurse or doctor.          acetaminophen  325 MG tablet Commonly known as: TYLENOL  Take 650 mg by mouth daily as needed for moderate pain or headache.   ALPRAZolam  0.25 MG tablet Commonly known as:  XANAX  Take 0.25 mg by mouth 2 (two) times daily as needed for anxiety.   aspirin  EC 81 MG tablet Take 1 tablet (81 mg total) by mouth daily. Swallow whole.   cetirizine 10 MG tablet Commonly known as: ZYRTEC Take 10 mg by mouth daily.   escitalopram  5 MG tablet Commonly known as: LEXAPRO  Take 5 mg by mouth daily.   Farxiga  10 MG Tabs tablet Generic drug: dapagliflozin  propanediol Take 1 tablet (10 mg total) by mouth daily.   ipratropium 0.03 % nasal spray Commonly known as: ATROVENT Place 2 sprays into both nostrils 2 (two) times daily.   lidocaine -prilocaine  cream Commonly known as: EMLA  Apply 1 Application topically as needed. Apply to [facility] 1 hour prior to procedure.   metFORMIN  500 MG tablet Commonly known as: GLUCOPHAGE  Take 500 mg by mouth daily.   ondansetron  4 MG disintegrating tablet Commonly known as: ZOFRAN -ODT Take 4 mg by mouth as needed.   Ozempic  (2 MG/DOSE) 8 MG/3ML Sopn Generic drug: Semaglutide  (2 MG/DOSE) Inject 2 mg into the skin once a week.   rosuvastatin  40 MG tablet Commonly known as: CRESTOR  TAKE 1 TABLET BY MOUTH EVERY DAY        Allergies:  Allergies  Allergen Reactions   Mango Flavoring  Agent (Non-Screening) Swelling and Other (See Comments)    LIPS SWELL   Rituximab  Other (See Comments)    Blood pressure went extremely low.    Shellfish Allergy Anaphylaxis   Lactose Intolerance (Gi) Diarrhea    Past Medical History, Surgical history, Social history, and Family History were reviewed and updated.  Review of Systems: Review of Systems  Constitutional: Negative.   HENT: Negative.    Eyes: Negative.   Respiratory: Negative.    Cardiovascular: Negative.   Gastrointestinal: Negative.   Genitourinary: Negative.   Musculoskeletal: Negative.   Skin: Negative.   Neurological: Negative.   Endo/Heme/Allergies: Negative.   Psychiatric/Behavioral: Negative.       Physical Exam:  height is 5\' 11"  (1.803 m) and weight is 268 lb  (121.6 kg). His oral temperature is 97.9 F (36.6 C). His blood pressure is 122/81 and his pulse is 81. His respiration is 18 and oxygen  saturation is 100%.   Wt Readings from Last 3 Encounters:  07/07/23 268 lb (121.6 kg)  05/25/23 268 lb (121.6 kg)  05/03/23 262 lb (118.8 kg)    His vital signs are temperature of 97.8.  Pulse 82.  Blood pressure 111/69.  Weight is 276 pounds.   Physical Exam Vitals reviewed.  HENT:     Head: Normocephalic and atraumatic.  Eyes:     Pupils: Pupils are equal, round, and reactive to light.  Cardiovascular:     Rate and Rhythm: Normal rate and regular rhythm.     Heart sounds: Normal heart sounds.  Pulmonary:     Effort: Pulmonary effort is normal.     Breath sounds: Normal breath sounds.  Abdominal:     General: Bowel sounds are normal.     Palpations: Abdomen is soft.  Musculoskeletal:        General: No tenderness or deformity. Normal range of motion.     Cervical back: Normal range of motion.  Lymphadenopathy:     Cervical: No cervical adenopathy.  Skin:    General: Skin is warm and dry.     Findings: No erythema or rash.  Neurological:     Mental Status: He is alert and oriented to person, place, and time.  Psychiatric:        Behavior: Behavior normal.        Thought Content: Thought content normal.        Judgment: Judgment normal.     Lab Results  Component Value Date   WBC 5.4 07/07/2023   HGB 13.7 07/07/2023   HCT 39.9 07/07/2023   MCV 90.5 07/07/2023   PLT 215 07/07/2023   Lab Results  Component Value Date   FERRITIN 55 09/28/2022   IRON 84 09/28/2022   TIBC 347 09/28/2022   UIBC 263 09/28/2022   IRONPCTSAT 24 09/28/2022   Lab Results  Component Value Date   RETICCTPCT 1.07 12/06/2015   RBC 4.41 07/07/2023   RETICCTABS 48.79 12/06/2015   Lab Results  Component Value Date   KPAFRELGTCHN 11.7 11/30/2022   LAMBDASER 7.7 11/30/2022   KAPLAMBRATIO 1.52 11/30/2022   Lab Results  Component Value Date    IGGSERUM 542 (L) 01/22/2023   IGA 116 01/22/2023   IGMSERUM 11 (L) 01/22/2023   Lab Results  Component Value Date   TOTALPROTELP 5.9 (L) 11/30/2022   ALBUMINELP 3.6 11/30/2022   A1GS 0.2 11/30/2022   A2GS 0.8 11/30/2022   BETS 0.8 11/30/2022   GAMS 0.5 11/30/2022   MSPIKE Not Observed 11/30/2022  Chemistry      Component Value Date/Time   NA 141 07/07/2023 1225   NA 143 02/04/2022 1506   NA 141 12/03/2016 1155   NA 141 02/07/2016 1149   K 3.8 07/07/2023 1225   K 3.8 12/03/2016 1155   K 4.3 02/07/2016 1149   CL 105 07/07/2023 1225   CL 100 12/03/2016 1155   CO2 29 07/07/2023 1225   CO2 27 12/03/2016 1155   CO2 27 02/07/2016 1149   BUN 10 07/07/2023 1225   BUN 11 02/04/2022 1506   BUN 10 12/03/2016 1155   BUN 10.9 02/07/2016 1149   CREATININE 0.89 07/07/2023 1225   CREATININE 1.0 12/03/2016 1155   CREATININE 0.9 02/07/2016 1149      Component Value Date/Time   CALCIUM  8.7 (L) 07/07/2023 1225   CALCIUM  9.1 12/03/2016 1155   CALCIUM  9.6 02/07/2016 1149   ALKPHOS 103 07/07/2023 1225   ALKPHOS 99 (H) 12/03/2016 1155   ALKPHOS 127 02/07/2016 1149   AST 18 07/07/2023 1225   AST 17 02/07/2016 1149   ALT 18 07/07/2023 1225   ALT 24 12/03/2016 1155   ALT 16 02/07/2016 1149   BILITOT 0.6 07/07/2023 1225   BILITOT 0.42 02/07/2016 1149       Impression and Plan: James Jennings is a pleasant 53 yo caucasian gentleman with history of follicular large cell non-Hodgkin's lymphoma. He completed 6 cycles of chemotherapy with bendamustine  on 07/03/2016 but was not tolerant of Rituxan  (anaphylaxis). His lymphoma has  recurred.  We then treated him with CHOP.  This got him into remission.  He has completed all of his treatment for the recurrent follicular large cell lymphoma.  I think that if this does come back again, he will probably be a good candidate for CAR-T therapy.  Again, the PET scan does not show any evidence of recurrence of his lymphoma.  We will probably plan for  another PET scan in about 6 months.  We will get him through the Summer.  I would like to see him back after Labor Day.   Ivor Mars, MD 6/4/20251:12 PM

## 2023-07-07 NOTE — Patient Instructions (Signed)

## 2023-07-23 DIAGNOSIS — G4733 Obstructive sleep apnea (adult) (pediatric): Secondary | ICD-10-CM | POA: Diagnosis not present

## 2023-07-28 DIAGNOSIS — G4733 Obstructive sleep apnea (adult) (pediatric): Secondary | ICD-10-CM | POA: Diagnosis not present

## 2023-07-29 DIAGNOSIS — G4733 Obstructive sleep apnea (adult) (pediatric): Secondary | ICD-10-CM | POA: Diagnosis not present

## 2023-08-02 ENCOUNTER — Other Ambulatory Visit: Payer: Self-pay

## 2023-08-03 DIAGNOSIS — E7800 Pure hypercholesterolemia, unspecified: Secondary | ICD-10-CM | POA: Diagnosis not present

## 2023-08-03 DIAGNOSIS — E119 Type 2 diabetes mellitus without complications: Secondary | ICD-10-CM | POA: Diagnosis not present

## 2023-08-03 DIAGNOSIS — Z125 Encounter for screening for malignant neoplasm of prostate: Secondary | ICD-10-CM | POA: Diagnosis not present

## 2023-08-10 DIAGNOSIS — Z0000 Encounter for general adult medical examination without abnormal findings: Secondary | ICD-10-CM | POA: Diagnosis not present

## 2023-08-10 DIAGNOSIS — E119 Type 2 diabetes mellitus without complications: Secondary | ICD-10-CM | POA: Diagnosis not present

## 2023-08-10 DIAGNOSIS — R82998 Other abnormal findings in urine: Secondary | ICD-10-CM | POA: Diagnosis not present

## 2023-08-10 DIAGNOSIS — Z1331 Encounter for screening for depression: Secondary | ICD-10-CM | POA: Diagnosis not present

## 2023-08-10 DIAGNOSIS — E7800 Pure hypercholesterolemia, unspecified: Secondary | ICD-10-CM | POA: Diagnosis not present

## 2023-08-10 DIAGNOSIS — Z1339 Encounter for screening examination for other mental health and behavioral disorders: Secondary | ICD-10-CM | POA: Diagnosis not present

## 2023-08-16 DIAGNOSIS — H15011 Anterior scleritis, right eye: Secondary | ICD-10-CM | POA: Diagnosis not present

## 2023-08-18 ENCOUNTER — Inpatient Hospital Stay: Attending: Hematology & Oncology

## 2023-08-18 VITALS — BP 126/80 | HR 78 | Temp 98.3°F | Resp 18

## 2023-08-18 DIAGNOSIS — Z452 Encounter for adjustment and management of vascular access device: Secondary | ICD-10-CM | POA: Diagnosis not present

## 2023-08-18 DIAGNOSIS — C8223 Follicular lymphoma grade III, unspecified, intra-abdominal lymph nodes: Secondary | ICD-10-CM | POA: Insufficient documentation

## 2023-08-18 DIAGNOSIS — D801 Nonfamilial hypogammaglobulinemia: Secondary | ICD-10-CM | POA: Insufficient documentation

## 2023-08-18 MED ORDER — HEPARIN SOD (PORK) LOCK FLUSH 100 UNIT/ML IV SOLN
500.0000 [IU] | Freq: Once | INTRAVENOUS | Status: AC
  Administered 2023-08-18: 500 [IU] via INTRAVENOUS

## 2023-08-18 MED ORDER — SODIUM CHLORIDE 0.9% FLUSH
10.0000 mL | Freq: Once | INTRAVENOUS | Status: AC
  Administered 2023-08-18: 10 mL via INTRAVENOUS

## 2023-08-18 NOTE — Patient Instructions (Signed)

## 2023-08-22 DIAGNOSIS — G4733 Obstructive sleep apnea (adult) (pediatric): Secondary | ICD-10-CM | POA: Diagnosis not present

## 2023-08-24 DIAGNOSIS — H15011 Anterior scleritis, right eye: Secondary | ICD-10-CM | POA: Diagnosis not present

## 2023-09-22 DIAGNOSIS — G4733 Obstructive sleep apnea (adult) (pediatric): Secondary | ICD-10-CM | POA: Diagnosis not present

## 2023-10-06 ENCOUNTER — Encounter: Payer: Self-pay | Admitting: Hematology & Oncology

## 2023-10-06 ENCOUNTER — Inpatient Hospital Stay: Attending: Hematology & Oncology

## 2023-10-06 ENCOUNTER — Inpatient Hospital Stay

## 2023-10-06 ENCOUNTER — Inpatient Hospital Stay (HOSPITAL_BASED_OUTPATIENT_CLINIC_OR_DEPARTMENT_OTHER): Admitting: Hematology & Oncology

## 2023-10-06 VITALS — BP 120/71 | HR 84 | Temp 98.0°F | Resp 20 | Ht 71.0 in | Wt 266.0 lb

## 2023-10-06 DIAGNOSIS — D801 Nonfamilial hypogammaglobulinemia: Secondary | ICD-10-CM | POA: Diagnosis not present

## 2023-10-06 DIAGNOSIS — M25472 Effusion, left ankle: Secondary | ICD-10-CM | POA: Diagnosis not present

## 2023-10-06 DIAGNOSIS — Z9221 Personal history of antineoplastic chemotherapy: Secondary | ICD-10-CM | POA: Insufficient documentation

## 2023-10-06 DIAGNOSIS — Z7982 Long term (current) use of aspirin: Secondary | ICD-10-CM | POA: Diagnosis not present

## 2023-10-06 DIAGNOSIS — C8223 Follicular lymphoma grade III, unspecified, intra-abdominal lymph nodes: Secondary | ICD-10-CM

## 2023-10-06 DIAGNOSIS — Z79899 Other long term (current) drug therapy: Secondary | ICD-10-CM | POA: Diagnosis not present

## 2023-10-06 DIAGNOSIS — H109 Unspecified conjunctivitis: Secondary | ICD-10-CM | POA: Insufficient documentation

## 2023-10-06 DIAGNOSIS — Z7984 Long term (current) use of oral hypoglycemic drugs: Secondary | ICD-10-CM | POA: Diagnosis not present

## 2023-10-06 LAB — CBC WITH DIFFERENTIAL (CANCER CENTER ONLY)
Abs Immature Granulocytes: 0.02 K/uL (ref 0.00–0.07)
Basophils Absolute: 0 K/uL (ref 0.0–0.1)
Basophils Relative: 0 %
Eosinophils Absolute: 0.1 K/uL (ref 0.0–0.5)
Eosinophils Relative: 2 %
HCT: 41.6 % (ref 39.0–52.0)
Hemoglobin: 14.2 g/dL (ref 13.0–17.0)
Immature Granulocytes: 0 %
Lymphocytes Relative: 21 %
Lymphs Abs: 1.1 K/uL (ref 0.7–4.0)
MCH: 31.4 pg (ref 26.0–34.0)
MCHC: 34.1 g/dL (ref 30.0–36.0)
MCV: 92 fL (ref 80.0–100.0)
Monocytes Absolute: 0.6 K/uL (ref 0.1–1.0)
Monocytes Relative: 11 %
Neutro Abs: 3.6 K/uL (ref 1.7–7.7)
Neutrophils Relative %: 66 %
Platelet Count: 203 K/uL (ref 150–400)
RBC: 4.52 MIL/uL (ref 4.22–5.81)
RDW: 12.7 % (ref 11.5–15.5)
WBC Count: 5.5 K/uL (ref 4.0–10.5)
nRBC: 0 % (ref 0.0–0.2)

## 2023-10-06 LAB — CMP (CANCER CENTER ONLY)
ALT: 25 U/L (ref 0–44)
AST: 27 U/L (ref 15–41)
Albumin: 4.4 g/dL (ref 3.5–5.0)
Alkaline Phosphatase: 122 U/L (ref 38–126)
Anion gap: 10 (ref 5–15)
BUN: 13 mg/dL (ref 6–20)
CO2: 26 mmol/L (ref 22–32)
Calcium: 9.2 mg/dL (ref 8.9–10.3)
Chloride: 105 mmol/L (ref 98–111)
Creatinine: 1.01 mg/dL (ref 0.61–1.24)
GFR, Estimated: >60 mL/min (ref >60)
Glucose, Bld: 82 mg/dL (ref 70–99)
Potassium: 4.2 mmol/L (ref 3.5–5.1)
Sodium: 140 mmol/L (ref 135–145)
Total Bilirubin: 0.5 mg/dL (ref 0.0–1.2)
Total Protein: 6.7 g/dL (ref 6.5–8.1)

## 2023-10-06 LAB — LACTATE DEHYDROGENASE: LDH: 180 U/L (ref 98–192)

## 2023-10-06 MED ORDER — TOBRAMYCIN 0.3 % OP SOLN
2.0000 [drp] | Freq: Four times a day (QID) | OPHTHALMIC | 0 refills | Status: DC
Start: 2023-10-06 — End: 2023-12-02

## 2023-10-06 NOTE — Patient Instructions (Signed)

## 2023-10-06 NOTE — Progress Notes (Signed)
 Hematology and Oncology Follow Up Visit  James Jennings [id number] 1970/11/01 53 y.o. 10/06/2023   Principle Diagnosis:  Follicular B- cell non-Hodgkin's lymphoma - Relapsed Hypogammaglobulinemia   Past Therapy:             Rituxan /bendamustine -s/p cycle 6 - completed on 07/03/2016   Current Therapy:  Gazyva   - Cytoxan / Vincristine /Prednisone   - started 11/07/2019, s/p cycle #4 --  D/c on 03/26/2020 G-CHOP -- s/p cycle #6-- started on 04/03/2020 -completed on 07/13/2020 Maintenance Gazyva -s/p cycle 10/12 --  start on 09/05/2020 --completed on 07/27/2022    Interim History:  James Jennings is here today for follow-up.  We last saw him 3 months ago.  Since then, the problems that he has looks like a conjunctivitis in the right eye.  He has had this for quite a while.  He has put on some steroids.  He was then put on some antibiotics.  It seemed to get better but then would come back.  Maybe, we should try some antibiotic eyedrops.  I will send in some Tobrex  for him.  Otherwise, he is on Mounjaro  now.  He still losing weight.  I am very happy for him.  He has had no problems with nausea or vomiting.  He said no change in bowel or bladder habits.  He and his family had a wonderful vacation down the [facility].  They really enjoyed themselves.  They were in the water almost all the time.  He has had no problems with rashes.  He has little bit of swelling in the left ankle.  He has had no headache.  He has had no mouth sores.  Overall, his performance status is ECOG 1.   Medications:  Allergies as of 10/06/2023       Reactions   Mango Flavoring Agent (non-screening) Swelling, Other (See Comments)   LIPS SWELL   Rituximab  Other (See Comments)   Blood pressure went extremely low.    Shellfish Allergy Anaphylaxis   Lactose Intolerance (gi) Diarrhea        Medication List        Accurate as of October 06, 2023  1:04 PM. If you have any questions, ask your nurse or doctor.           STOP taking these medications    Ozempic  (2 MG/DOSE) 8 MG/3ML Sopn Generic drug: Semaglutide  (2 MG/DOSE) Stopped by: Maude JONELLE Crease       TAKE these medications    acetaminophen  325 MG tablet Commonly known as: TYLENOL  Take 650 mg by mouth daily as needed for moderate pain or headache.   ALPRAZolam  0.25 MG tablet Commonly known as: XANAX  Take 0.25 mg by mouth 2 (two) times daily as needed for anxiety.   aspirin  EC 81 MG tablet Take 1 tablet (81 mg total) by mouth daily. Swallow whole.   cetirizine 10 MG tablet Commonly known as: ZYRTEC Take 10 mg by mouth daily.   escitalopram  5 MG tablet Commonly known as: LEXAPRO  Take 5 mg by mouth daily.   Farxiga  10 MG Tabs tablet Generic drug: dapagliflozin  propanediol Take 1 tablet (10 mg total) by mouth daily.   ipratropium 0.03 % nasal spray Commonly known as: ATROVENT Place 2 sprays into both nostrils 2 (two) times daily.   lidocaine -prilocaine  cream Commonly known as: EMLA  Apply 1 Application topically as needed. Apply to [facility] 1 hour prior to procedure.   metFORMIN  500 MG tablet Commonly known as: GLUCOPHAGE  Take 500 mg by mouth daily.  Mounjaro  10 MG/0.5ML Pen Generic drug: tirzepatide  Inject 10 mg into the skin once a week.   ondansetron  4 MG disintegrating tablet Commonly known as: ZOFRAN -ODT Take 4 mg by mouth as needed.   predniSONE  10 MG tablet Commonly known as: DELTASONE  Take 10 mg by mouth daily as needed.   rosuvastatin  40 MG tablet Commonly known as: CRESTOR  TAKE 1 TABLET BY MOUTH EVERY DAY        Allergies:  Allergies  Allergen Reactions   Mango Flavoring Agent (Non-Screening) Swelling and Other (See Comments)    LIPS SWELL   Rituximab  Other (See Comments)    Blood pressure went extremely low.    Shellfish Allergy Anaphylaxis   Lactose Intolerance (Gi) Diarrhea    Past Medical History, Surgical history, Social history, and Family History were reviewed and updated.  Review of  Systems: Review of Systems  Constitutional: Negative.   HENT: Negative.    Eyes: Negative.   Respiratory: Negative.    Cardiovascular: Negative.   Gastrointestinal: Negative.   Genitourinary: Negative.   Musculoskeletal: Negative.   Skin: Negative.   Neurological: Negative.   Endo/Heme/Allergies: Negative.   Psychiatric/Behavioral: Negative.       Physical Exam:  height is 5' 11 (1.803 m) and weight is 266 lb (120.7 kg). His oral temperature is 98 F (36.7 C). His blood pressure is 120/71 and his pulse is 84. His respiration is 20 and oxygen  saturation is 98%.   Wt Readings from Last 3 Encounters:  10/06/23 266 lb (120.7 kg)  07/07/23 268 lb (121.6 kg)  05/25/23 268 lb (121.6 kg)    His vital signs are temperature of 97.8.  Pulse 82.  Blood pressure 111/69.  Weight is 276 pounds.   Physical Exam Vitals reviewed.  HENT:     Head: Normocephalic and atraumatic.  Eyes:     Pupils: Pupils are equal, round, and reactive to light.     Comments: His ocular exam does show some conjunctival irritation of the right eye.  He has good range of motion of the muscles.  He has good pupillary reflex.  Cardiovascular:     Rate and Rhythm: Normal rate and regular rhythm.     Heart sounds: Normal heart sounds.  Pulmonary:     Effort: Pulmonary effort is normal.     Breath sounds: Normal breath sounds.  Abdominal:     General: Bowel sounds are normal.     Palpations: Abdomen is soft.  Musculoskeletal:        General: No tenderness or deformity. Normal range of motion.     Cervical back: Normal range of motion.  Lymphadenopathy:     Cervical: No cervical adenopathy.  Skin:    General: Skin is warm and dry.     Findings: No erythema or rash.  Neurological:     Mental Status: He is alert and oriented to person, place, and time.  Psychiatric:        Behavior: Behavior normal.        Thought Content: Thought content normal.        Judgment: Judgment normal.      Lab Results   Component Value Date   WBC 5.5 10/06/2023   HGB 14.2 10/06/2023   HCT 41.6 10/06/2023   MCV 92.0 10/06/2023   PLT 203 10/06/2023   Lab Results  Component Value Date   FERRITIN 55 09/28/2022   IRON 84 09/28/2022   TIBC 347 09/28/2022   UIBC 263 09/28/2022   IRONPCTSAT 24  09/28/2022   Lab Results  Component Value Date   RETICCTPCT 1.07 12/06/2015   RBC 4.52 10/06/2023   RETICCTABS 48.79 12/06/2015   Lab Results  Component Value Date   KPAFRELGTCHN 11.7 11/30/2022   LAMBDASER 7.7 11/30/2022   KAPLAMBRATIO 1.52 11/30/2022   Lab Results  Component Value Date   IGGSERUM 542 (L) 01/22/2023   IGA 116 01/22/2023   IGMSERUM 11 (L) 01/22/2023   Lab Results  Component Value Date   TOTALPROTELP 5.9 (L) 11/30/2022   ALBUMINELP 3.6 11/30/2022   A1GS 0.2 11/30/2022   A2GS 0.8 11/30/2022   BETS 0.8 11/30/2022   GAMS 0.5 11/30/2022   MSPIKE Not Observed 11/30/2022     Chemistry      Component Value Date/Time   NA 140 10/06/2023 1157   NA 143 02/04/2022 1506   NA 141 12/03/2016 1155   NA 141 02/07/2016 1149   K 4.2 10/06/2023 1157   K 3.8 12/03/2016 1155   K 4.3 02/07/2016 1149   CL 105 10/06/2023 1157   CL 100 12/03/2016 1155   CO2 26 10/06/2023 1157   CO2 27 12/03/2016 1155   CO2 27 02/07/2016 1149   BUN 13 10/06/2023 1157   BUN 11 02/04/2022 1506   BUN 10 12/03/2016 1155   BUN 10.9 02/07/2016 1149   CREATININE 1.01 10/06/2023 1157   CREATININE 1.0 12/03/2016 1155   CREATININE 0.9 02/07/2016 1149      Component Value Date/Time   CALCIUM  9.2 10/06/2023 1157   CALCIUM  9.1 12/03/2016 1155   CALCIUM  9.6 02/07/2016 1149   ALKPHOS 122 10/06/2023 1157   ALKPHOS 99 (H) 12/03/2016 1155   ALKPHOS 127 02/07/2016 1149   AST 27 10/06/2023 1157   AST 17 02/07/2016 1149   ALT 25 10/06/2023 1157   ALT 24 12/03/2016 1155   ALT 16 02/07/2016 1149   BILITOT 0.5 10/06/2023 1157   BILITOT 0.42 02/07/2016 1149       Impression and Plan: James Jennings is a pleasant 53 yo  caucasian gentleman with history of follicular large cell non-Hodgkin's lymphoma. He completed 6 cycles of chemotherapy with bendamustine  on 07/03/2016 but was not tolerant of Rituxan  (anaphylaxis). His lymphoma has  recurred.  We then treated him with CHOP.  This got him into remission.  He has completed all of his treatment for the recurrent follicular large cell lymphoma.  I think that if this does come back again, he will probably be a good candidate for CAR-T therapy.  Again, the last PET scan did not show any evidence of recurrence of his lymphoma.  Hopefully, the Tobrex  eyedrops will help with this ocular inflammation.  I will plan to see him back in 3 months.  We will then see about maybe another PET scan.   Maude JONELLE Crease, MD 9/3/20251:04 PM

## 2023-10-07 LAB — IGG, IGA, IGM
IgA: 143 mg/dL (ref 90–386)
IgG (Immunoglobin G), Serum: 586 mg/dL — ABNORMAL LOW (ref 603–1613)
IgM (Immunoglobulin M), Srm: 23 mg/dL (ref 20–172)

## 2023-11-17 ENCOUNTER — Inpatient Hospital Stay: Attending: Hematology & Oncology

## 2023-11-17 DIAGNOSIS — Z452 Encounter for adjustment and management of vascular access device: Secondary | ICD-10-CM | POA: Insufficient documentation

## 2023-11-17 DIAGNOSIS — D801 Nonfamilial hypogammaglobulinemia: Secondary | ICD-10-CM | POA: Diagnosis not present

## 2023-11-17 DIAGNOSIS — C8223 Follicular lymphoma grade III, unspecified, intra-abdominal lymph nodes: Secondary | ICD-10-CM | POA: Insufficient documentation

## 2023-11-17 DIAGNOSIS — C801 Malignant (primary) neoplasm, unspecified: Secondary | ICD-10-CM | POA: Insufficient documentation

## 2023-11-17 NOTE — Patient Instructions (Signed)

## 2023-12-02 ENCOUNTER — Encounter: Payer: Self-pay | Admitting: Neurology

## 2023-12-02 ENCOUNTER — Ambulatory Visit: Admitting: Neurology

## 2023-12-02 VITALS — BP 126/85 | HR 115 | Resp 14 | Ht 71.75 in | Wt 269.2 lb

## 2023-12-02 DIAGNOSIS — Z8673 Personal history of transient ischemic attack (TIA), and cerebral infarction without residual deficits: Secondary | ICD-10-CM

## 2023-12-02 DIAGNOSIS — G4733 Obstructive sleep apnea (adult) (pediatric): Secondary | ICD-10-CM | POA: Diagnosis not present

## 2023-12-02 DIAGNOSIS — Q2112 Patent foramen ovale: Secondary | ICD-10-CM

## 2023-12-02 NOTE — Patient Instructions (Signed)
 I had a long d/w patient about his recent cryptogenic stroke, PFO closure,risk for recurrent stroke/TIAs, personally independently reviewed imaging studies and stroke evaluation results and answered questions.Continue aspirin  81 mg daily and clopidogrel  75 mg daily  for secondary stroke prevention   and maintain strict control of hypertension with blood pressure goal below 130/90, diabetes with hemoglobin A1c goal below 6.5% and lipids with LDL cholesterol goal below 70 mg/dL. I also advised the patient to eat a healthy diet with plenty of whole grains, cereals, fruits and vegetables, exercise regularly and maintain ideal body weight.   he was also counseled to start using CPAP regularly for his sleep apnea   Followup in the future with me in future only as necessary  or call earlier if necessary.

## 2023-12-02 NOTE — Progress Notes (Signed)
 [facility] [address] [place]. [place] [address] [phone]       OFFICE FOLLOW-UP VISIT NOTE  [name] Date of Birth:  1970/09/09 Medical Record Number:  [id number]   Referring MD: Ary Cummins  Reason for Referral: Stroke  HPI: Initial visit  01/29/2022 [name] is a 53 year old Caucasian male seen today for initial office consultation visit for stroke.  History is obtained from the patient and review of electronic medical records.  I personally reviewed pertinent available imaging films in PACS.  He has past medical history of diabetes, hyperlipidemia sleep apnea, obesity and non-Hodgkin's lymphoma.  He developed sudden onset of confusion and vision difficulties when he was about to sit in the car to drive back from his son's school.  He fortunately called his wife who was nearby who came and drove him to [facility] emergency room.  EMS called a code stroke.  He was evaluated by Dr. Michiel and felt to have right homonymous hemianopsia and was given IV TNK.  His confusion resolved quickly and peripheral vision took several days to improve.  CT scan of the head showed no acute abnormality and CT angiogram of the neck and brain showed no large vessel stenosis or occlusion.  MRI scan of the brain showed an acute infarct involving left occipital cortex and hippocampal scale in the PCA distribution without any hemorrhage.  Echocardiogram shows ejection fraction of 60 to 65% and TEE showed patent foramen ovale with atrial septal aneurysm.  His rope score was5-6 and he has been referred for outpatient PFO closure evaluation to Dr. Wendel.  He is currently wearing a 30-day heart monitor on which she had a brief episode of ventricular tachycardia and has been started on low-dose Toprol -XL 25 mg daily.  LDL cholesterol was 77 mg percent and hemoglobin A1c of 6.4.  He was started on aspirin  and Plavix  which she is tolerating well without bruising or bleeding.  He states his  peripheral vision also seems to have improved.  He is returned back to his baseline and has no respiratory deficits.  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  He has had some mild short-term memory difficulties for several years and feels that may have gotten slightly worse.  He is tolerating Lipitor  well without muscle aches and pains..  He has no new complaints today.  He does have obstructive sleep apnea and has  been using his CPAP regularly. Update 06/15/2022 : He returns for follow-up after last visit 4 and half months ago.  He states he is doing well.  He denies known recurrent stroke or TIA symptoms.  He had elective endovascular PFO closed on 03/06/2022 by Dr. Wendel and procedure went well without any complications.  He is presently on aspirin  and Plavix  and tolerating it well with minor bruising and no bleeding.  He has an upcoming appointment with Dr. Wendel next month.  He had 4-week external cardiac monitor from 03/16/2022 to 04/14/2022 which was negative for paroxysmal A-fib.  He remains on Crestor  which is tolerating well without muscle aches and pains.  He was briefly hospitalized last week to the hospital with pneumonia and is on antibiotics and feeling a lot better.  He states he is using his CPAP regularly for his sleep apnea. Update 12/02/2023 : He returns for follow-up after last visit nearly a year and a half ago.  Continues to do well.  Has had no recurrent TIA  or stroke symptoms.  He remains on aspirin  she is tolerating well without bruising or bleeding.  He had follow-up TCD bubble study done on 07/21/2022 which was negative for right-to-left shunt.  He recently had an echocardiogram also done on 03/08/2023 which showed normal ejection fraction and good positioning of the PFO closure device without any residual shunt.  He is tolerating Crestor  well without muscle aches and pains.  He had lab work done last summer which was satisfactory.  He started Mounjaro   for his diabetes for last 2 months and has lost some weight.  His fasting sugars are all good.  Last hemoglobin A1c was 5.6.  His blood pressure is under good control today it is 126/83.  He states his quite regular with using his CPAP for his sleep apnea and been much better.  He has no complaints today. ROS:   14 system review of systems is positive for no complaints today.  And all other systems negative  PMH:  Past Medical History:  Diagnosis Date   Anxiety    Bilateral swelling of feet    Diabetes mellitus without complication ([name])    High cholesterol    Hypogammaglobulinemia 06/15/2022   Joint pain    Lactose intolerance    Large cell, follicular non-Hodgkin's lymphoma ([name])    Lymphoma, follicular ([name]) dx'd 01/2016   Multiple food allergies    Other fatigue    Palpitations    PONV (postoperative nausea and vomiting)    Pre-diabetes    Prediabetes    Shortness of breath on exertion    Sleep apnea    dx'd ~ 2008; never RX'd mask (02/28/2016)   Stroke [facility])     Social History:  Social History   Socioeconomic History   Marital status: Married    Spouse name: Erhardt Dada   Number of children: 1   Years of education: Not on file   Highest education level: Not on file  Occupational History   Occupation: auditor  Tobacco Use   Smoking status: Former    Current packs/day: 0.00    Average packs/day: 1 pack/day for 30.0 years (30.0 ttl pk-yrs)    Types: Cigarettes    Start date: 05/06/1987    Quit date: 05/05/2017    Years since quitting: 6.5   Smokeless tobacco: Former    Types: Snuff    Quit date: 1997   Tobacco comments:    11/12/2015 quit using chew in ~ 1997  Vaping Use   Vaping status: Never Used  Substance and Sexual Activity   Alcohol  use: Not Currently   Drug use: Not Currently    Types: Marijuana    Comment: in the early 1990s; recreational   Sexual activity: Yes    Partners: Female  Other Topics Concern   Not on file  Social History Narrative    Not on file   Social Drivers of Health   Financial Resource Strain: Not on file  Food Insecurity: No Food Insecurity (06/03/2022)   Hunger Vital Sign    Worried About Running Out of Food in the Last Year: Never true    Ran Out of Food in the Last Year: Never true  Transportation Needs: No Transportation Needs (06/03/2022)   PRAPARE - Administrator, Civil Service (Medical): No    Lack of Transportation (Non-Medical): No  Physical Activity: Not on file  Stress: Not on file  Social Connections: Not on file  Intimate Partner Violence: Not At Risk (06/03/2022)  Humiliation, Afraid, Rape, and Kick questionnaire    Fear of Current or Ex-Partner: No    Emotionally Abused: No    Physically Abused: No    Sexually Abused: No    Medications:   Current Outpatient Medications on File Prior to Visit  Medication Sig Dispense Refill   acetaminophen  (TYLENOL ) 325 MG tablet Take 650 mg by mouth daily as needed for moderate pain or headache.     ALPRAZolam  (XANAX ) 0.25 MG tablet Take 0.25 mg by mouth 2 (two) times daily as needed for anxiety.     aspirin  EC 81 MG tablet Take 1 tablet (81 mg total) by mouth daily. Swallow whole. 90 tablet 12   cetirizine (ZYRTEC) 10 MG tablet Take 10 mg by mouth daily.     FARXIGA  10 MG TABS tablet Take 1 tablet (10 mg total) by mouth daily. 30 tablet 3   ipratropium (ATROVENT) 0.03 % nasal spray Place 2 sprays into both nostrils 2 (two) times daily.     lidocaine -prilocaine  (EMLA ) cream Apply 1 Application topically as needed. Apply to [facility] 1 hour prior to procedure. 30 g 3   metFORMIN  (GLUCOPHAGE ) 500 MG tablet Take 500 mg by mouth daily.     MOUNJARO  10 MG/0.5ML Pen Inject 10 mg into the skin once a week.     ondansetron  (ZOFRAN -ODT) 4 MG disintegrating tablet Take 4 mg by mouth as needed.     rosuvastatin  (CRESTOR ) 40 MG tablet TAKE 1 TABLET BY MOUTH EVERY DAY 90 tablet 3   No current facility-administered medications on file prior to visit.     Allergies:   Allergies  Allergen Reactions   Mango Flavoring Agent (Non-Screening) Swelling and Other (See Comments)    LIPS SWELL   Rituximab  Other (See Comments)    Blood pressure went extremely low.    Shellfish Allergy Anaphylaxis   Lactose Intolerance (Gi) Diarrhea    Physical Exam General: Mildly obese middle-aged Caucasian male, seated, in no evident distress Head: head normocephalic and atraumatic.   Neck: supple with no carotid or supraclavicular bruits Cardiovascular: regular rate and rhythm, no murmurs Musculoskeletal: no deformity Skin:  no rash/petichiae Vascular:  Normal pulses all extremities  Neurologic Exam Mental Status: Awake and fully alert. Oriented to place and time. Recent and remote memory intact. Attention span, concentration and fund of knowledge appropriate. Mood and affect appropriate.  Cranial Nerves: Fundoscopic exam not done. Pupils equal, briskly reactive to light. Extraocular movements full without nystagmus. Visual fields full to confrontation. Hearing intact. Facial sensation intact. Face, tongue, palate moves normally and symmetrically.  Motor: Normal bulk and tone. Normal strength in all tested extremity muscles. Sensory.: intact to touch , pinprick , position and vibratory sensation.  Coordination: Rapid alternating movements normal in all extremities. Finger-to-nose and heel-to-shin performed accurately bilaterally. Gait and Station: Arises from chair without difficulty. Stance is normal. Gait demonstrates normal stride length and balance . Able to heel, toe and tandem walk without difficulty.  Reflexes: 1+ and symmetric. Toes downgoing.    ASSESSMENT: 53 year old Caucasian male with left PCA branch infarct October 2023 of cryptogenic etiology treated with IV TNK with excellent clinical recovery no residual deficits.  Vascular risk factors of obesity, sleep apnea, diabetes, hypertension, hyperlipidemia and patent foramen ovale.  Patient  continues to do well without recurrent neurovascular symptoms     PLAN:I had a long d/w patient about his recent cryptogenic stroke, PFO closure,risk for recurrent stroke/TIAs, personally independently reviewed imaging studies and stroke evaluation results and answered questions.Continue  aspirin  81 mg daily and clopidogrel  75 mg daily  for secondary stroke prevention   and maintain strict control of hypertension with blood pressure goal below 130/90, diabetes with hemoglobin A1c goal below 6.5% and lipids with LDL cholesterol goal below 70 mg/dL. I also advised the patient to eat a healthy diet with plenty of whole grains, cereals, fruits and vegetables, exercise regularly and maintain ideal body weight.   he was also counseled to start using CPAP regularly for his sleep apnea   Followup in the future with me in future only as necessary  or call earlier if necessary.   I personally spent a total of 35 minutes in the care of the patient today including getting/reviewing separately obtained history, performing a medically appropriate exam/evaluation, counseling and educating, placing orders, referring and communicating with other health care professionals, documenting clinical information in the EHR, independently interpreting results, and coordinating care.        Eather Popp, MD Note: This document was prepared with digital dictation and possible smart phrase technology. Any transcriptional errors that result from this process are unintentional.

## 2024-01-05 ENCOUNTER — Inpatient Hospital Stay: Attending: Hematology & Oncology

## 2024-01-05 ENCOUNTER — Inpatient Hospital Stay

## 2024-01-05 ENCOUNTER — Inpatient Hospital Stay: Admitting: Hematology & Oncology

## 2024-01-05 VITALS — BP 110/75 | HR 80 | Temp 98.1°F | Resp 16 | Wt 267.0 lb

## 2024-01-05 DIAGNOSIS — C8223 Follicular lymphoma grade III, unspecified, intra-abdominal lymph nodes: Secondary | ICD-10-CM | POA: Diagnosis not present

## 2024-01-05 DIAGNOSIS — D801 Nonfamilial hypogammaglobulinemia: Secondary | ICD-10-CM | POA: Diagnosis not present

## 2024-01-05 DIAGNOSIS — Z9221 Personal history of antineoplastic chemotherapy: Secondary | ICD-10-CM | POA: Insufficient documentation

## 2024-01-05 LAB — CMP (CANCER CENTER ONLY)
ALT: 22 U/L (ref 0–44)
AST: 26 U/L (ref 15–41)
Albumin: 4.4 g/dL (ref 3.5–5.0)
Alkaline Phosphatase: 120 U/L (ref 38–126)
Anion gap: 12 (ref 5–15)
BUN: 11 mg/dL (ref 6–20)
CO2: 25 mmol/L (ref 22–32)
Calcium: 9.1 mg/dL (ref 8.9–10.3)
Chloride: 106 mmol/L (ref 98–111)
Creatinine: 1.04 mg/dL (ref 0.61–1.24)
GFR, Estimated: >60 mL/min (ref >60)
Glucose, Bld: 115 mg/dL — ABNORMAL HIGH (ref 70–99)
Potassium: 4.2 mmol/L (ref 3.5–5.1)
Sodium: 142 mmol/L (ref 135–145)
Total Bilirubin: 0.6 mg/dL (ref 0.0–1.2)
Total Protein: 6.8 g/dL (ref 6.5–8.1)

## 2024-01-05 LAB — CBC WITH DIFFERENTIAL (CANCER CENTER ONLY)
Abs Immature Granulocytes: 0.02 K/uL (ref 0.00–0.07)
Basophils Absolute: 0 K/uL (ref 0.0–0.1)
Basophils Relative: 1 %
Eosinophils Absolute: 0.1 K/uL (ref 0.0–0.5)
Eosinophils Relative: 2 %
HCT: 43.5 % (ref 39.0–52.0)
Hemoglobin: 14.7 g/dL (ref 13.0–17.0)
Immature Granulocytes: 0 %
Lymphocytes Relative: 22 %
Lymphs Abs: 1.2 K/uL (ref 0.7–4.0)
MCH: 30.9 pg (ref 26.0–34.0)
MCHC: 33.8 g/dL (ref 30.0–36.0)
MCV: 91.6 fL (ref 80.0–100.0)
Monocytes Absolute: 0.6 K/uL (ref 0.1–1.0)
Monocytes Relative: 10 %
Neutro Abs: 3.7 K/uL (ref 1.7–7.7)
Neutrophils Relative %: 65 %
Platelet Count: 200 K/uL (ref 150–400)
RBC: 4.75 MIL/uL (ref 4.22–5.81)
RDW: 12.5 % (ref 11.5–15.5)
WBC Count: 5.7 K/uL (ref 4.0–10.5)
nRBC: 0 % (ref 0.0–0.2)

## 2024-01-05 LAB — LACTATE DEHYDROGENASE: LDH: 165 U/L (ref 105–235)

## 2024-01-05 NOTE — Patient Instructions (Signed)

## 2024-01-05 NOTE — Progress Notes (Signed)
 Hematology and Oncology Follow Up Visit  James Jennings [id number] May 13, 1970 53 y.o. 01/05/2024   Principle Diagnosis:  Follicular B- cell non-Hodgkin's lymphoma - Relapsed Hypogammaglobulinemia   Past Therapy:             Rituxan /bendamustine -s/p cycle 6 - completed on 07/03/2016   Current Therapy:  Gazyva   - Cytoxan / Vincristine /Prednisone   - started 11/07/2019, s/p cycle #4 --  D/c on 03/26/2020 G-CHOP -- s/p cycle #6-- started on 04/03/2020 -completed on 07/13/2020 Maintenance Gazyva -s/p cycle 10/12 --  start on 09/05/2020 --completed on 07/27/2022    Interim History:  James Jennings is here today for follow-up.  We last saw him 3 months ago.  Overall, I think is doing pretty well.  He did have an episode of gastroenteritis when he was in [place].  He was sick for about a day.  He has had no problems with fever.  He has had no rashes.  He has had no cough or shortness of breath.  He does have a little bit of fullness he feels in the inguinal area.  I know that this is where he has had a recurrence in the past of his lymphoma.  As such, we may have to think about a PET scan for him.  He has had no problems with headache.  He has had no problems with the Mounjaro .  He is still losing some weight.  He has had no leg swelling.  There is been no neuropathy.  Overall, I will say that his performance status is probably ECOG 1.   Medications:  Allergies as of 01/05/2024       Reactions   Mango Flavoring Agent (non-screening) Swelling, Other (See Comments)   LIPS SWELL   Rituximab  Other (See Comments)   Blood pressure went extremely low.    Shellfish Allergy Anaphylaxis   Lactose Intolerance (gi) Diarrhea        Medication List        Accurate as of January 05, 2024 12:25 PM. If you have any questions, ask your nurse or doctor.          acetaminophen  325 MG tablet Commonly known as: TYLENOL  Take 650 mg by mouth daily as needed for moderate pain or headache.   ALPRAZolam  0.25  MG tablet Commonly known as: XANAX  Take 0.25 mg by mouth 2 (two) times daily as needed for anxiety.   aspirin  EC 81 MG tablet Take 1 tablet (81 mg total) by mouth daily. Swallow whole.   cetirizine 10 MG tablet Commonly known as: ZYRTEC Take 10 mg by mouth daily.   Farxiga  10 MG Tabs tablet Generic drug: dapagliflozin  propanediol Take 1 tablet (10 mg total) by mouth daily.   ipratropium 0.03 % nasal spray Commonly known as: ATROVENT Place 2 sprays into both nostrils 2 (two) times daily.   lidocaine -prilocaine  cream Commonly known as: EMLA  Apply 1 Application topically as needed. Apply to [facility] 1 hour prior to procedure.   metFORMIN  500 MG tablet Commonly known as: GLUCOPHAGE  Take 500 mg by mouth daily.   Mounjaro  10 MG/0.5ML Pen Generic drug: tirzepatide  Inject 10 mg into the skin once a week.   ondansetron  4 MG disintegrating tablet Commonly known as: ZOFRAN -ODT Take 4 mg by mouth as needed.   rosuvastatin  40 MG tablet Commonly known as: CRESTOR  TAKE 1 TABLET BY MOUTH EVERY DAY        Allergies:  Allergies  Allergen Reactions   Mango Flavoring Agent (Non-Screening) Swelling and Other (See Comments)  LIPS SWELL   Rituximab  Other (See Comments)    Blood pressure went extremely low.    Shellfish Allergy Anaphylaxis   Lactose Intolerance (Gi) Diarrhea    Past Medical History, Surgical history, Social history, and Family History were reviewed and updated.  Review of Systems: Review of Systems  Constitutional: Negative.   HENT: Negative.    Eyes: Negative.   Respiratory: Negative.    Cardiovascular: Negative.   Gastrointestinal: Negative.   Genitourinary: Negative.   Musculoskeletal: Negative.   Skin: Negative.   Neurological: Negative.   Endo/Heme/Allergies: Negative.   Psychiatric/Behavioral: Negative.       Physical Exam:  weight is 267 lb (121.1 kg). His oral temperature is 98.1 F (36.7 C). His blood pressure is 110/75 and his pulse is 80.  His respiration is 16 and oxygen  saturation is 98%.   Wt Readings from Last 3 Encounters:  01/05/24 267 lb (121.1 kg)  12/02/23 269 lb 3.2 oz (122.1 kg)  10/06/23 266 lb (120.7 kg)     Physical Exam Vitals reviewed.  HENT:     Head: Normocephalic and atraumatic.  Eyes:     Pupils: Pupils are equal, round, and reactive to light.     Comments: His ocular exam does show some conjunctival irritation of the right eye.  He has good range of motion of the muscles.  He has good pupillary reflex.  Cardiovascular:     Rate and Rhythm: Normal rate and regular rhythm.     Heart sounds: Normal heart sounds.  Pulmonary:     Effort: Pulmonary effort is normal.     Breath sounds: Normal breath sounds.  Abdominal:     General: Bowel sounds are normal.     Palpations: Abdomen is soft.     Comments: In the inguinal area, he may have a little bit of fullness, more so on the right side than on the left side.  I cannot palpate any distinct adenopathy.  Musculoskeletal:        General: No tenderness or deformity. Normal range of motion.     Cervical back: Normal range of motion.  Lymphadenopathy:     Cervical: No cervical adenopathy.  Skin:    General: Skin is warm and dry.     Findings: No erythema or rash.  Neurological:     Mental Status: He is alert and oriented to person, place, and time.  Psychiatric:        Behavior: Behavior normal.        Thought Content: Thought content normal.        Judgment: Judgment normal.      Lab Results  Component Value Date   WBC 5.7 01/05/2024   HGB 14.7 01/05/2024   HCT 43.5 01/05/2024   MCV 91.6 01/05/2024   PLT 200 01/05/2024   Lab Results  Component Value Date   FERRITIN 55 09/28/2022   IRON 84 09/28/2022   TIBC 347 09/28/2022   UIBC 263 09/28/2022   IRONPCTSAT 24 09/28/2022   Lab Results  Component Value Date   RETICCTPCT 1.07 12/06/2015   RBC 4.75 01/05/2024   RETICCTABS 48.79 12/06/2015   Lab Results  Component Value Date    KPAFRELGTCHN 11.7 11/30/2022   LAMBDASER 7.7 11/30/2022   KAPLAMBRATIO 1.52 11/30/2022   Lab Results  Component Value Date   IGGSERUM 586 (L) 10/06/2023   IGA 143 10/06/2023   IGMSERUM 23 10/06/2023   Lab Results  Component Value Date   TOTALPROTELP 5.9 (L) 11/30/2022  ALBUMINELP 3.6 11/30/2022   A1GS 0.2 11/30/2022   A2GS 0.8 11/30/2022   BETS 0.8 11/30/2022   GAMS 0.5 11/30/2022   MSPIKE Not Observed 11/30/2022     Chemistry      Component Value Date/Time   NA 140 10/06/2023 1157   NA 143 02/04/2022 1506   NA 141 12/03/2016 1155   NA 141 02/07/2016 1149   K 4.2 10/06/2023 1157   K 3.8 12/03/2016 1155   K 4.3 02/07/2016 1149   CL 105 10/06/2023 1157   CL 100 12/03/2016 1155   CO2 26 10/06/2023 1157   CO2 27 12/03/2016 1155   CO2 27 02/07/2016 1149   BUN 13 10/06/2023 1157   BUN 11 02/04/2022 1506   BUN 10 12/03/2016 1155   BUN 10.9 02/07/2016 1149   CREATININE 1.01 10/06/2023 1157   CREATININE 1.0 12/03/2016 1155   CREATININE 0.9 02/07/2016 1149      Component Value Date/Time   CALCIUM  9.2 10/06/2023 1157   CALCIUM  9.1 12/03/2016 1155   CALCIUM  9.6 02/07/2016 1149   ALKPHOS 122 10/06/2023 1157   ALKPHOS 99 (H) 12/03/2016 1155   ALKPHOS 127 02/07/2016 1149   AST 27 10/06/2023 1157   AST 17 02/07/2016 1149   ALT 25 10/06/2023 1157   ALT 24 12/03/2016 1155   ALT 16 02/07/2016 1149   BILITOT 0.5 10/06/2023 1157   BILITOT 0.42 02/07/2016 1149       Impression and Plan: Mr. Matuszak is a pleasant 53 yo caucasian gentleman with history of follicular large cell non-Hodgkin's lymphoma. He completed 6 cycles of chemotherapy with bendamustine  on 07/03/2016 but was not tolerant of Rituxan  (anaphylaxis). His lymphoma has  recurred.  We then treated him with CHOP.  This got him into remission.  He has completed all of his treatment for the recurrent follicular large cell lymphoma.  I think that if this does come back again, he will probably be a good candidate for CAR-T  therapy.  Again, there may be a little bit of fullness in the inguinal region.  I think that we probably do need to do a PET scan on him since this is where he had his last recurrence.  We will set 1 up for him in January.  I think this would be reasonable.  I will plan to see him back in February of next year.  He still has his Port-A-Cath and so we had to make sure we keep this flushed.    Maude JONELLE Crease, MD 12/3/202512:25 PM

## 2024-01-12 DIAGNOSIS — G4733 Obstructive sleep apnea (adult) (pediatric): Secondary | ICD-10-CM | POA: Diagnosis not present

## 2024-01-15 ENCOUNTER — Encounter: Payer: Self-pay | Admitting: Hematology & Oncology

## 2024-01-17 ENCOUNTER — Ambulatory Visit (HOSPITAL_COMMUNITY): Admission: RE | Admit: 2024-01-17 | Source: Ambulatory Visit

## 2024-01-17 DIAGNOSIS — C8223 Follicular lymphoma grade III, unspecified, intra-abdominal lymph nodes: Secondary | ICD-10-CM

## 2024-01-17 DIAGNOSIS — C8590 Non-Hodgkin lymphoma, unspecified, unspecified site: Secondary | ICD-10-CM | POA: Diagnosis not present

## 2024-01-17 LAB — GLUCOSE, CAPILLARY: Glucose-Capillary: 135 mg/dL — ABNORMAL HIGH (ref 70–99)

## 2024-01-17 MED ORDER — HEPARIN SOD (PORK) LOCK FLUSH 100 UNIT/ML IV SOLN
500.0000 [IU] | Freq: Once | INTRAVENOUS | Status: AC
  Administered 2024-01-17: 08:00:00 500 [IU] via INTRAVENOUS

## 2024-01-17 MED ORDER — FLUDEOXYGLUCOSE F - 18 (FDG) INJECTION
12.0000 | Freq: Once | INTRAVENOUS | Status: AC
  Administered 2024-01-17: 08:00:00 13.25 via INTRAVENOUS

## 2024-01-24 ENCOUNTER — Telehealth: Payer: Self-pay | Admitting: Hematology & Oncology

## 2024-01-24 ENCOUNTER — Other Ambulatory Visit: Payer: Self-pay | Admitting: Hematology & Oncology

## 2024-01-24 MED ORDER — XOFLUZA (80 MG DOSE) 1 X 80 MG PO TBPK: 80.0000 mg | ORAL_TABLET | Freq: Once | ORAL | 0 refills | Status: AC

## 2024-01-24 NOTE — Telephone Encounter (Signed)
 I called James Jennings this evening.  I called him about the PET scan results.  Currently, he says he can out the flu.  He got it from his son.  His symptoms started today.  I will call in Xofluza  for him.  I think this would be appropriate.  He just takes 1 dose of 80 mg.  I will have to speak with his gastroenterologist.  On the PET scan, there is some activity in the anorectal region.  Again it may be an internal hemorrhoid which he says he has.  As far as the throat, I think we just have to watch this.  I cannot imagine that he is lymphoma came back in the tonsil/cervical region.  This could all be inflammatory from his influenza.  He is thankful that I can call something in for him.  I told him that I just want him to feel better for Christmas.   Jeralyn Crease, MD

## 2024-01-25 ENCOUNTER — Encounter: Payer: Self-pay | Admitting: Gastroenterology

## 2024-01-25 ENCOUNTER — Telehealth: Payer: Self-pay

## 2024-01-25 ENCOUNTER — Encounter: Payer: Self-pay | Admitting: Hematology & Oncology

## 2024-01-25 NOTE — Telephone Encounter (Signed)
 LEC. GM

## 2024-01-25 NOTE — Telephone Encounter (Signed)
 Can this be done in the [facility] or does it need to be hosp?

## 2024-01-25 NOTE — Telephone Encounter (Signed)
 Patient returned call and is now scheduled for pre-visit 01/31/24 and colon 02/11/24 Please advise  Thank you

## 2024-01-25 NOTE — Telephone Encounter (Signed)
 Can one of you please set up colon and previsit for the first of the year please? Thank you

## 2024-01-25 NOTE — Telephone Encounter (Signed)
-----   Message from Aloha Finner, MD sent at 01/25/2024  5:31 AM EST ----- PRE, Thanks for reaching out. Happy to help. He is due this year for full colonoscopy surveillance. I could just do a flex sigmoidoscopy but I think just makes sense to get full colonoscopy done if he is up for it. I'll have my team reach out to get this taken care of (I have a couple of open slots now for the beginning of the year if he is available. Thanks. GM  Arthea Nobel, Please reach out to patient. He is due for colonoscopy surveillance of previous polyps and since his recent PET-CT showed some activity in the anorectal region, we feel that we want to make sure/exclude any other issues that are present currently. Can you please offer him surveillance colonoscopy for the beginning of the year and see if my newly opened slots work for him? Thanks. GM ----- Message ----- From: Timmy Maude SAUNDERS, MD Sent: 01/24/2024   5:56 PM EST To: Aloha Finner Raddle., MD  Gabe: I was wondering if you could take a look at Mr.Maahs's PET scan.  There is some activity in the anorectal region.  I know that you scoped him I think a couple years ago.  He says he has an internal hemorrhoid.  I do not know if he needs to have a anoscopy or maybe flex sig to make sure nothing is going on down there.  He has a history of recurrent follicular lymphoma.  It would be unusual for it to come back in the rectal or anal region, but yet it is always a possibility.  I know you are incredibly busy.  I do thank you so much for helping all of my patients this year.  I know that we kept you quite busy.  I hope that you and your family will have a very Merry Christmas!!!!    Jeralyn

## 2024-01-25 NOTE — Telephone Encounter (Signed)
 Called patient, no answer left vm to call back and schedule

## 2024-01-31 ENCOUNTER — Ambulatory Visit

## 2024-01-31 VITALS — Ht 71.0 in | Wt 265.0 lb

## 2024-01-31 DIAGNOSIS — Z860100 Personal history of colon polyps, unspecified: Secondary | ICD-10-CM

## 2024-01-31 MED ORDER — NA SULFATE-K SULFATE-MG SULF 17.5-3.13-1.6 GM/177ML PO SOLN: 1.0000 | Freq: Once | ORAL | 0 refills | Status: AC

## 2024-01-31 NOTE — Progress Notes (Signed)

## 2024-02-08 ENCOUNTER — Encounter: Payer: Self-pay | Admitting: Gastroenterology

## 2024-02-11 ENCOUNTER — Encounter: Payer: Self-pay | Admitting: Gastroenterology

## 2024-02-11 ENCOUNTER — Ambulatory Visit: Admitting: Gastroenterology

## 2024-02-11 VITALS — BP 120/76 | HR 79 | Temp 98.6°F | Resp 10 | Ht 71.0 in | Wt 265.0 lb

## 2024-02-11 DIAGNOSIS — Z860101 Personal history of adenomatous and serrated colon polyps: Secondary | ICD-10-CM | POA: Diagnosis not present

## 2024-02-11 DIAGNOSIS — K641 Second degree hemorrhoids: Secondary | ICD-10-CM

## 2024-02-11 DIAGNOSIS — Z1211 Encounter for screening for malignant neoplasm of colon: Secondary | ICD-10-CM

## 2024-02-11 DIAGNOSIS — K5730 Diverticulosis of large intestine without perforation or abscess without bleeding: Secondary | ICD-10-CM

## 2024-02-11 DIAGNOSIS — K5140 Inflammatory polyps of colon without complications: Secondary | ICD-10-CM | POA: Diagnosis not present

## 2024-02-11 DIAGNOSIS — D127 Benign neoplasm of rectosigmoid junction: Secondary | ICD-10-CM

## 2024-02-11 DIAGNOSIS — Z860100 Personal history of colon polyps, unspecified: Secondary | ICD-10-CM

## 2024-02-11 MED ORDER — SODIUM CHLORIDE 0.9 % IV SOLN: 500.0000 mL | Freq: Once | INTRAVENOUS | Status: AC

## 2024-02-11 NOTE — Progress Notes (Signed)
 "  GASTROENTEROLOGY PROCEDURE H&P NOTE   Primary Care Physician: Yolande Toribio MATSU, MD  HPI: James Jennings is a 54 y.o. male who presents for Colonoscopy for surveillance of previous adenomas.  Past Medical History:  Diagnosis Date   Allergy    Anxiety    Bilateral swelling of feet    Diabetes mellitus without complication ([name])    High cholesterol    Hypogammaglobulinemia 06/15/2022   Joint pain    Lactose intolerance    Large cell, follicular non-Hodgkin's lymphoma ([name])    Lymphoma, follicular ([name]) dx'd 01/2016   Multiple food allergies    Other fatigue    Palpitations    PONV (postoperative nausea and vomiting)    Pre-diabetes    Prediabetes    Shortness of breath on exertion    Sleep apnea    dx'd ~ 2008; never RX'd mask (02/28/2016)   Stroke [facility])    Past Surgical History:  Procedure Laterality Date   ACHILLES TENDON SURGERY Left ~ 2012   APPENDECTOMY  11/12/2015   lap appy   BUBBLE STUDY  12/04/2021   Procedure: BUBBLE STUDY;  Surgeon: Lonni Slain, MD;  Location: [facility] ENDOSCOPY;  Service: Cardiovascular;;   CLUB FOOT RELEASE Bilateral 1971   HERNIA REPAIR     INGUINAL LYMPH NODE BIOPSY Right 01/20/2016   Procedure: EXCISIONAL BIOPSY OF RIGHT INGUINAL LYMPH NODE;  Surgeon: Camellia Blush, MD;  Location: [facility] OR;  Service: General;  Laterality: Right;   IR IMAGING GUIDED PORT INSERTION  11/02/2019   LAPAROSCOPIC APPENDECTOMY N/A 11/12/2015   Procedure: APPENDECTOMY LAPAROSCOPIC;  Surgeon: Camellia Blush, MD;  Location: [facility] OR;  Service: General;  Laterality: N/A;   LYMPH NODE BIOPSY Left 03/20/2020   Procedure: EXCISIONAL BIOPSY LEFT INGUINAL LYMPH NODE;  Surgeon: Vernetta Berg, MD;  Location: [facility] OR;  Service: General;  Laterality: Left;  LMA   PATENT FORAMEN OVALE(PFO) CLOSURE N/A 03/06/2022   Procedure: PATENT FORAMEN OVALE(PFO) CLOSURE;  Surgeon: Wendel Lurena POUR, MD;  Location: [facility] INVASIVE CV LAB;  Service: Cardiovascular;  Laterality: N/A;   SUTURE REMOVAL  Left ~ 2016-2017 X 3   had to use permanent sutures w/my achilles OR; my body rejects them at times & I have to have them surgically removed; under anesthesia   TEE WITHOUT CARDIOVERSION N/A 12/04/2021   Procedure: TRANSESOPHAGEAL ECHOCARDIOGRAM (TEE);  Surgeon: Lonni Slain, MD;  Location: [facility] ENDOSCOPY;  Service: Cardiovascular;  Laterality: N/A;   VASECTOMY Bilateral 07/2019   VENTRAL HERNIA REPAIR  1994   Current Outpatient Medications  Medication Sig Dispense Refill   acetaminophen  (TYLENOL ) 325 MG tablet Take 650 mg by mouth daily as needed for moderate pain or headache.     ALPRAZolam  (XANAX ) 0.25 MG tablet Take 0.25 mg by mouth 2 (two) times daily as needed for anxiety.     aspirin  EC 81 MG tablet Take 1 tablet (81 mg total) by mouth daily. Swallow whole. 90 tablet 12   cetirizine (ZYRTEC) 10 MG tablet Take 10 mg by mouth daily.     FARXIGA  10 MG TABS tablet Take 1 tablet (10 mg total) by mouth daily. 30 tablet 3   ipratropium (ATROVENT) 0.03 % nasal spray Place 2 sprays into both nostrils 2 (two) times daily.     lidocaine -prilocaine  (EMLA ) cream Apply 1 Application topically as needed. Apply to [facility] 1 hour prior to procedure. 30 g 3   metFORMIN  (GLUCOPHAGE ) 500 MG tablet Take 500 mg by mouth daily.     MOUNJARO  10 MG/0.5ML Pen  Inject 10 mg into the skin once a week.     ondansetron  (ZOFRAN -ODT) 4 MG disintegrating tablet Take 4 mg by mouth as needed.     rosuvastatin  (CRESTOR ) 40 MG tablet TAKE 1 TABLET BY MOUTH EVERY DAY 90 tablet 3   No current facility-administered medications for this visit.   Current Medications[1] Allergies[2] Family History  Problem Relation Age of Onset   Colon cancer Neg Hx    Colon polyps Neg Hx    Esophageal cancer Neg Hx    Stomach cancer Neg Hx    Rectal cancer Neg Hx    Inflammatory bowel disease Neg Hx    Liver disease Neg Hx    Pancreatic cancer Neg Hx    Social History   Socioeconomic History   Marital status: Married     Spouse name: Rajon Bisig   Number of children: 1   Years of education: Not on file   Highest education level: Not on file  Occupational History   Occupation: auditor  Tobacco Use   Smoking status: Former    Current packs/day: 0.00    Average packs/day: 1 pack/day for 30.0 years (30.0 ttl pk-yrs)    Types: Cigarettes    Start date: 05/06/1987    Quit date: 05/05/2017    Years since quitting: 6.7   Smokeless tobacco: Former    Types: Snuff    Quit date: 1997   Tobacco comments:    11/12/2015 quit using chew in ~ 1997  Vaping Use   Vaping status: Never Used  Substance and Sexual Activity   Alcohol  use: Not Currently   Drug use: Not Currently    Types: Marijuana    Comment: in the early 1990s; recreational   Sexual activity: Yes    Partners: Female  Other Topics Concern   Not on file  Social History Narrative   Not on file   Social Drivers of Health   Tobacco Use: Medium Risk (01/31/2024)   Patient History    Smoking Tobacco Use: Former    Smokeless Tobacco Use: Former    Passive Exposure: Not on Actuary Strain: Not on file  Food Insecurity: No Food Insecurity (06/03/2022)   Hunger Vital Sign    Worried About Running Out of Food in the Last Year: Never true    Ran Out of Food in the Last Year: Never true  Transportation Needs: No Transportation Needs (06/03/2022)   PRAPARE - Administrator, Civil Service (Medical): No    Lack of Transportation (Non-Medical): No  Physical Activity: Not on file  Stress: Not on file  Social Connections: Not on file  Intimate Partner Violence: Not At Risk (06/03/2022)   Humiliation, Afraid, Rape, and Kick questionnaire    Fear of Current or Ex-Partner: No    Emotionally Abused: No    Physically Abused: No    Sexually Abused: No  Depression (PHQ2-9): Low Risk (01/05/2024)   Depression (PHQ2-9)    PHQ-2 Score: 0  Alcohol  Screen: Not on file  Housing: Low Risk (06/03/2022)   Housing    Last Housing Risk Score:  0  Utilities: Not At Risk (06/03/2022)   AHC Utilities    Threatened with loss of utilities: No  Health Literacy: Not on file    Physical Exam: There were no vitals filed for this visit. There is no height or weight on file to calculate BMI. GEN: NAD EYE: Sclerae anicteric ENT: MMM CV: Non-tachycardic GI: Soft, NT/ND NEURO:  Alert &  Oriented x 3  Lab Results: No results for input(s): WBC, HGB, HCT, PLT in the last 72 hours. BMET No results for input(s): NA, K, CL, CO2, GLUCOSE, BUN, CREATININE, CALCIUM  in the last 72 hours. LFT No results for input(s): PROT, ALBUMIN, AST, ALT, ALKPHOS, BILITOT, BILIDIR, IBILI in the last 72 hours. PT/INR No results for input(s): LABPROT, INR in the last 72 hours.   Impression / Plan: This is a 54 y.o.male who presents for Colonoscopy for surveillance of previous adenomas.  The risks and benefits of endoscopic evaluation/treatment were discussed with the patient and/or family; these include but are not limited to the risk of perforation, infection, bleeding, missed lesions, lack of diagnosis, severe illness requiring hospitalization, as well as anesthesia and sedation related illnesses.  The patient's history has been reviewed, patient examined, no change in status, and deemed stable for procedure.  The patient and/or family was provided an opportunity to ask questions and all were answered.  The patient and/or family is agreeable to proceed.    Aloha Finner, MD [facility] Office # [phone]     [1]  Current Outpatient Medications:    acetaminophen  (TYLENOL ) 325 MG tablet, Take 650 mg by mouth daily as needed for moderate pain or headache., Disp: , Rfl:    ALPRAZolam  (XANAX ) 0.25 MG tablet, Take 0.25 mg by mouth 2 (two) times daily as needed for anxiety., Disp: , Rfl:    aspirin  EC 81 MG tablet, Take 1 tablet (81 mg total) by mouth daily. Swallow whole.,  Disp: 90 tablet, Rfl: 12   cetirizine (ZYRTEC) 10 MG tablet, Take 10 mg by mouth daily., Disp: , Rfl:    FARXIGA  10 MG TABS tablet, Take 1 tablet (10 mg total) by mouth daily., Disp: 30 tablet, Rfl: 3   ipratropium (ATROVENT) 0.03 % nasal spray, Place 2 sprays into both nostrils 2 (two) times daily., Disp: , Rfl:    lidocaine -prilocaine  (EMLA ) cream, Apply 1 Application topically as needed. Apply to [facility] 1 hour prior to procedure., Disp: 30 g, Rfl: 3   metFORMIN  (GLUCOPHAGE ) 500 MG tablet, Take 500 mg by mouth daily., Disp: , Rfl:    MOUNJARO  10 MG/0.5ML Pen, Inject 10 mg into the skin once a week., Disp: , Rfl:    ondansetron  (ZOFRAN -ODT) 4 MG disintegrating tablet, Take 4 mg by mouth as needed., Disp: , Rfl:    rosuvastatin  (CRESTOR ) 40 MG tablet, TAKE 1 TABLET BY MOUTH EVERY DAY, Disp: 90 tablet, Rfl: 3 [2]  Allergies Allergen Reactions   Mango Flavoring Agent (Non-Screening) Swelling and Other (See Comments)    LIPS SWELL   Rituximab  Other (See Comments)    Blood pressure went extremely low.    Shellfish Allergy Anaphylaxis   Lactose Intolerance (Gi) Diarrhea   "

## 2024-02-11 NOTE — Progress Notes (Signed)
 Report to PACU, RN, vss, BBS= Clear.

## 2024-02-11 NOTE — Patient Instructions (Signed)
 YOU HAD AN ENDOSCOPIC PROCEDURE TODAY AT THE [facility]:   Refer to the procedure report that was given to you for any specific questions about what was found during the examination.  If the procedure report does not answer your questions, please call your gastroenterologist to clarify.  If you requested that your care partner not be given the details of your procedure findings, then the procedure report has been included in a sealed envelope for you to review at your convenience later.  YOU SHOULD EXPECT: Some feelings of bloating in the abdomen. Passage of more gas than usual.  Walking can help get rid of the air that was put into your GI tract during the procedure and reduce the bloating. If you had a lower endoscopy (such as a colonoscopy or flexible sigmoidoscopy) you may notice spotting of blood in your stool or on the toilet paper. If you underwent a bowel prep for your procedure, you may not have a normal bowel movement for a few days.  Please Note:  You might notice some irritation and congestion in your nose or some drainage.  This is from the oxygen  used during your procedure.  There is no need for concern and it should clear up in a day or so.  SYMPTOMS TO REPORT IMMEDIATELY:  Following lower endoscopy (colonoscopy or flexible sigmoidoscopy):  Excessive amounts of blood in the stool  Significant tenderness or worsening of abdominal pains  Swelling of the abdomen that is new, acute  Fever of 100F or higher   For urgent or emergent issues, a gastroenterologist can be reached at any hour by calling [phone]. Do not use MyChart messaging for urgent concerns.    DIET:  We do recommend a small meal at first, but then you may proceed to your regular diet.  Drink plenty of fluids but you should avoid alcoholic beverages for 24 hours. High Fiber Diet.  MEDICATIONS: Continue present medications.  Use FiberCon 1-2 tablets by mouth daily.  FOLLOW UP: Await pathology  results. Repeat colonoscopy in 5-7 years for surveillance (based on prior history of adenomas as well).  Educational handouts given to patient: High Fiber Diet, Polyps, Diverticulosis, Hemorrhoids.  Thank you for allowing us  to provide for your healthcare needs today.    ACTIVITY:  You should plan to take it easy for the rest of today and you should NOT DRIVE or use heavy machinery until tomorrow (because of the sedation medicines used during the test).    FOLLOW UP: Our staff will call the number listed on your records the next business day following your procedure.  We will call around 7:15- 8:00 am to check on you and address any questions or concerns that you may have regarding the information given to you following your procedure. If we do not reach you, we will leave a message.     If any biopsies were taken you will be contacted by phone or by letter within the next 1-3 weeks.  Please call us  at [phone] if you have not heard about the biopsies in 3 weeks.    SIGNATURES/CONFIDENTIALITY: You and/or your care partner have signed paperwork which will be entered into your electronic medical record.  These signatures attest to the fact that that the information above on your After Visit Summary has been reviewed and is understood.  Full responsibility of the confidentiality of this discharge information lies with you and/or your care-partner.

## 2024-02-11 NOTE — Progress Notes (Signed)
 Called to room to assist during endoscopic procedure.  Patient ID and intended procedure confirmed with present staff. Received instructions for my participation in the procedure from the performing physician.

## 2024-02-11 NOTE — Op Note (Signed)
 [facility] Patient Name: James Jennings Procedure Date: 02/11/2024 10:13 AM MRN: [id number] Endoscopist: Aloha Finner , MD, [id number] Age: 54 Referring MD:  Date of Birth: 07-17-1970 Gender: Male Account #: [id number] Procedure:                Colonoscopy Indications:              High risk colon cancer surveillance: Personal                            history of adenoma less than 10 mm in size Medicines:                Monitored Anesthesia Care Procedure:                Pre-Anesthesia Assessment:                           - Prior to the procedure, a History and Physical                            was performed, and patient medications and                            allergies were reviewed. The patient's tolerance of                            previous anesthesia was also reviewed. The risks                            and benefits of the procedure and the sedation                            options and risks were discussed with the patient.                            All questions were answered, and informed consent                            was obtained. Prior Anticoagulants: The patient has                            taken no anticoagulant or antiplatelet agents                            except for aspirin . ASA Grade Assessment: II - A                            patient with mild systemic disease. After reviewing                            the risks and benefits, the patient was deemed in                            satisfactory condition to undergo the procedure.  After obtaining informed consent, the colonoscope                            was passed under direct vision. Throughout the                            procedure, the patient's blood pressure, pulse, and                            oxygen  saturations were monitored continuously. The                            Colonoscope was introduced through the anus and                             advanced to the 3 cm into the ileum. The                            colonoscopy was performed without difficulty. The                            patient tolerated the procedure. The quality of the                            bowel preparation was adequate. The terminal ileum,                            ileocecal valve, appendiceal orifice, and rectum                            were photographed. Scope In: 10:34:50 AM Scope Out: 10:46:32 AM Scope Withdrawal Time: 0 hours 9 minutes 21 seconds  Total Procedure Duration: 0 hours 11 minutes 42 seconds  Findings:                 The digital rectal exam findings include                            hemorrhoids. Pertinent negatives include no                            palpable rectal lesions.                           The terminal ileum and ileocecal valve appeared                            normal.                           A 4 mm polyp was found in the recto-sigmoid colon.                            The polyp was sessile. The polyp was removed with a  cold snare. Resection and retrieval were complete.                           Multiple small-mouthed diverticula were found in                            the recto-sigmoid colon and sigmoid colon.                           Normal mucosa was found in the entire colon                            otherwise.                           Non-bleeding non-thrombosed internal hemorrhoids                            were found during retroflexion, during perianal                            exam and during digital exam. The hemorrhoids were                            Grade II (internal hemorrhoids that prolapse but                            reduce spontaneously). Complications:            No immediate complications. Estimated Blood Loss:     Estimated blood loss was minimal. Impression:               - Hemorrhoids found on digital rectal exam.                           - The examined  portion of the ileum was normal.                           - One 4 mm polyp at the recto-sigmoid colon,                            removed with a cold snare. Resected and retrieved.                           - Diverticulosis in the recto-sigmoid colon and in                            the sigmoid colon.                           - Normal mucosa in the entire examined colon                            otherwise.                           -  Non-bleeding non-thrombosed internal hemorrhoids. Recommendation:           - The patient will be observed post-procedure,                            until all discharge criteria are met.                           - Discharge patient to home.                           - Patient has a contact number available for                            emergencies. The signs and symptoms of potential                            delayed complications were discussed with the                            patient. Return to normal activities tomorrow.                            Written discharge instructions were provided to the                            patient.                           - High fiber diet.                           - Use FiberCon 1-2 tablets PO daily.                           - Continue present medications.                           - Await pathology results.                           - Repeat colonoscopy in 5-7 years for surveillance                            (based on prior history of adenomas as well).                           - The findings and recommendations were discussed                            with the patient.                           - The findings and recommendations were discussed                            with the patient's  family. Aloha Finner, MD 02/11/2024 10:50:14 AM

## 2024-02-11 NOTE — Progress Notes (Signed)
 Pt's states no medical or surgical changes since previsit or office visit.

## 2024-02-14 ENCOUNTER — Telehealth: Payer: Self-pay | Admitting: *Deleted

## 2024-02-14 NOTE — Telephone Encounter (Signed)
" °  Follow up Call-     02/11/2024    9:54 AM 11/24/2022    9:25 AM  Call back number  Post procedure Call Back phone  # [phone]  Permission to leave phone message Yes Yes    Post procedure follow up phone call. No answer at number given.  Left message on voicemail.  "

## 2024-02-17 ENCOUNTER — Inpatient Hospital Stay: Attending: Hematology & Oncology

## 2024-02-17 ENCOUNTER — Encounter: Payer: Self-pay | Admitting: Hematology & Oncology

## 2024-02-17 DIAGNOSIS — C8223 Follicular lymphoma grade III, unspecified, intra-abdominal lymph nodes: Secondary | ICD-10-CM | POA: Insufficient documentation

## 2024-02-17 DIAGNOSIS — Z452 Encounter for adjustment and management of vascular access device: Secondary | ICD-10-CM | POA: Insufficient documentation

## 2024-02-17 DIAGNOSIS — D801 Nonfamilial hypogammaglobulinemia: Secondary | ICD-10-CM | POA: Insufficient documentation

## 2024-02-17 LAB — SURGICAL PATHOLOGY

## 2024-02-17 NOTE — Patient Instructions (Signed)

## 2024-02-23 NOTE — Telephone Encounter (Signed)
 Spoke with wife who is going to get pt to take an OTC flu and Covid test. Once they get results she will send a message on MyChart. If they are both negative, we will place orders for a UA and culture per Dr Melvern request and we will bring him in for a lab only appointment. If positive, no appointment will be needed.

## 2024-02-24 ENCOUNTER — Inpatient Hospital Stay

## 2024-02-24 ENCOUNTER — Other Ambulatory Visit: Payer: Self-pay

## 2024-02-24 ENCOUNTER — Ambulatory Visit: Payer: Self-pay | Admitting: Hematology & Oncology

## 2024-02-24 DIAGNOSIS — C8223 Follicular lymphoma grade III, unspecified, intra-abdominal lymph nodes: Secondary | ICD-10-CM

## 2024-02-24 DIAGNOSIS — D801 Nonfamilial hypogammaglobulinemia: Secondary | ICD-10-CM | POA: Diagnosis not present

## 2024-02-24 LAB — URINALYSIS, COMPLETE (UACMP) WITH MICROSCOPIC
Bacteria, UA: NONE SEEN
Bilirubin Urine: NEGATIVE
Glucose, UA: >=500 mg/dL — AB
Hgb urine dipstick: NEGATIVE
Ketones, ur: NEGATIVE mg/dL
Leukocytes,Ua: NEGATIVE
Nitrite: NEGATIVE
Protein, ur: NEGATIVE mg/dL
Specific Gravity, Urine: 1.02 (ref 1.005–1.030)
pH: 5.5 (ref 5.0–8.0)

## 2024-02-25 ENCOUNTER — Ambulatory Visit: Payer: Self-pay | Admitting: Gastroenterology

## 2024-02-25 ENCOUNTER — Encounter: Payer: Self-pay | Admitting: Gastroenterology

## 2024-02-25 ENCOUNTER — Encounter: Payer: Self-pay | Admitting: Hematology & Oncology

## 2024-02-25 LAB — URINE CULTURE: Culture: NO GROWTH

## 2024-02-25 NOTE — Telephone Encounter (Signed)
 Advised via MyChart.

## 2024-02-25 NOTE — Telephone Encounter (Signed)
 Same for me JMP

## 2024-02-25 NOTE — Telephone Encounter (Signed)
-----   Message from Maude Crease, MD sent at 02/25/2024  1:34 PM EST ----- Please call and let him know that his urine culture is negative.  Great job.  Hope you feel better.  Jeralyn

## 2024-02-25 NOTE — Telephone Encounter (Signed)
-----   Message from Maude Crease, MD sent at 02/24/2024  5:26 PM EST ----- Please call and let him know that outside of a massive amount of sugar in his urine, there is no obvious infection.  Jeralyn

## 2024-02-28 NOTE — Telephone Encounter (Signed)
 I agree with this plan of action. Rule out a new fissure or other process and if necessary imaging prior to hemorrhoidal banding. Thanks to APP seeing him tomorrow. GM

## 2024-02-29 ENCOUNTER — Other Ambulatory Visit

## 2024-02-29 ENCOUNTER — Encounter: Payer: Self-pay | Admitting: Hematology & Oncology

## 2024-02-29 ENCOUNTER — Encounter: Payer: Self-pay | Admitting: Physician Assistant

## 2024-02-29 ENCOUNTER — Ambulatory Visit: Admitting: Physician Assistant

## 2024-02-29 VITALS — BP 140/90 | HR 109 | Ht 71.0 in | Wt 261.0 lb

## 2024-02-29 DIAGNOSIS — K6289 Other specified diseases of anus and rectum: Secondary | ICD-10-CM

## 2024-02-29 DIAGNOSIS — R509 Fever, unspecified: Secondary | ICD-10-CM

## 2024-02-29 DIAGNOSIS — K625 Hemorrhage of anus and rectum: Secondary | ICD-10-CM

## 2024-02-29 DIAGNOSIS — K641 Second degree hemorrhoids: Secondary | ICD-10-CM

## 2024-02-29 LAB — COMPREHENSIVE METABOLIC PANEL WITH GFR
ALT: 22 U/L (ref 3–53)
AST: 23 U/L (ref 5–37)
Albumin: 4.2 g/dL (ref 3.5–5.2)
Alkaline Phosphatase: 102 U/L (ref 39–117)
BUN: 13 mg/dL (ref 6–23)
CO2: 25 meq/L (ref 19–32)
Calcium: 9.1 mg/dL (ref 8.4–10.5)
Chloride: 101 meq/L (ref 96–112)
Creatinine, Ser: 1.05 mg/dL (ref 0.40–1.50)
GFR: 80.08 mL/min
Glucose, Bld: 135 mg/dL — ABNORMAL HIGH (ref 70–99)
Potassium: 3.9 meq/L (ref 3.5–5.1)
Sodium: 136 meq/L (ref 135–145)
Total Bilirubin: 0.8 mg/dL (ref 0.2–1.2)
Total Protein: 7 g/dL (ref 6.0–8.3)

## 2024-02-29 LAB — CBC
HCT: 43.2 % (ref 39.0–52.0)
Hemoglobin: 14.7 g/dL (ref 13.0–17.0)
MCHC: 33.9 g/dL (ref 30.0–36.0)
MCV: 89.5 fl (ref 78.0–100.0)
Platelets: 231 10*3/uL (ref 150.0–400.0)
RBC: 4.83 Mil/uL (ref 4.22–5.81)
RDW: 13.6 % (ref 11.5–15.5)
WBC: 6.8 10*3/uL (ref 4.0–10.5)

## 2024-02-29 MED ORDER — TRAMADOL HCL 50 MG PO TABS: 50.0000 mg | ORAL_TABLET | Freq: Four times a day (QID) | ORAL | 0 refills | Status: AC | PRN

## 2024-02-29 MED ORDER — LIDOCAINE 5 % EX OINT: 1.0000 | TOPICAL_OINTMENT | CUTANEOUS | 0 refills | Status: AC | PRN

## 2024-02-29 NOTE — Progress Notes (Signed)
"   Attending Physician's Attestation   I have reviewed the chart.   I agree with the Advanced Practitioner's note, impression, and recommendations with any updates as below. Thank you for seeing the patient urgently in follow-up.  Agree with imaging in the setting of what you have noted on anoscopy.  Likely will need to reschedule next weeks hemorrhoidal banding but hopefully will see what the pelvic CT shows before rescheduling at this point.  Please keep Dr. San and I up-to-date with the findings on imaging.   Aloha Finner, MD [facility] Advanced Endoscopy Office # [phone]  "

## 2024-02-29 NOTE — Progress Notes (Signed)
 "  Chief Complaint: Rectal pain  HPI:    James Jennings is a 54 year old male with a past medical history as listed below including diabetes, who was referred to me by Timmy Maude SAUNDERS, MD for a complaint of rectal pain.    01/17/2024 PET scan for lymphoma follow-up with bilateral tonsillar hypermetabolism with apparent soft tissue thickening on CT and small hypermetabolic bilateral level 2 cervical lymph node, otherwise no new evidence of disease progression, new focal anorectal hypermetabolism, indeterminate.  Emphysema.    02/11/2024 colonoscopy with Dr. Wilhelmenia done for history of an adenoma less than 10 mm with hemorrhoids, grade 2, 1 full 4 mm polyp at the rectosigmoid colon and diverticulosis in the rectosigmoid and sigmoid colon.  Pathology showed inflammatory polyp and repeat recommended in 3 years.    02/25/2024 patient noted and described hemorrhoid bleeding.  02/28/2024 removed and described that he had constant pressure and uncomfortable when pooping.  Also a fever.  Discussed that there was may be infection.  Recommend he come in for office visit before banding which is scheduled on 2/2.  Discussed the use of AI scribe software for clinical note transcription with the patient, who gave verbal consent to proceed.  History of Present Illness James Jennings is a 54 year old male with grade II internal hemorrhoids who presents with worsening rectal pain following a recent colonoscopy.  Rectal pain, bleeding, and fever have progressively worsened since a colonoscopy in early January, during which grade II internal hemorrhoids were identified. Prior to the procedure, symptoms were intermittent and less severe.  Current symptoms include constant rectal pressure with a sensation of swelling and persistent urge to defecate. Pressure radiates to the scrotal area. Discomfort intensifies after bowel movements, which are infrequent and characterized by very loose or pencil-thin stools, increased rectal  bleeding, and mucous-like discharge. Pain accompanies every bowel movement, though bowel movements do not occur daily. On symptomatic days, appetite is decreased.  Episodes of fever ranging from 100F to 101F occur approximately once per week, typically in the afternoon on days with severe symptoms. Laying down provides the most relief. Home management with warm baths, topical Preparation H, and hydrocortisone  suppositories has provided minimal relief.  No abdominal pain or cramping reported. Symptoms have not previously reached this severity prior to the colonoscopy.  Denies nausea, vomiting or abdominal pain.   Past Medical History:  Diagnosis Date   Allergy    Anxiety    Bilateral swelling of feet    Diabetes mellitus without complication ([name])    High cholesterol    Hypogammaglobulinemia 06/15/2022   Joint pain    Lactose intolerance    Large cell, follicular non-Hodgkin's lymphoma ([name])    Lymphoma, follicular ([name]) dx'd 01/2016   Multiple food allergies    Other fatigue    Palpitations    PONV (postoperative nausea and vomiting)    Pre-diabetes    Prediabetes    Shortness of breath on exertion    Sleep apnea    dx'd ~ 2008; never RX'd mask (02/28/2016)   Stroke ([name])    12-01-21    Past Surgical History:  Procedure Laterality Date   ACHILLES TENDON SURGERY Left ~ 2012   APPENDECTOMY  11/12/2015   lap appy   BUBBLE STUDY  12/04/2021   Procedure: BUBBLE STUDY;  Surgeon: Lonni Slain, MD;  Location: [facility] ENDOSCOPY;  Service: Cardiovascular;;   CLUB FOOT RELEASE Bilateral 1971   COLONOSCOPY     HERNIA REPAIR     INGUINAL  LYMPH NODE BIOPSY Right 01/20/2016   Procedure: EXCISIONAL BIOPSY OF RIGHT INGUINAL LYMPH NODE;  Surgeon: Camellia Blush, MD;  Location: [facility] OR;  Service: General;  Laterality: Right;   IR IMAGING GUIDED PORT INSERTION  11/02/2019   LAPAROSCOPIC APPENDECTOMY N/A 11/12/2015   Procedure: APPENDECTOMY LAPAROSCOPIC;  Surgeon: Camellia Blush, MD;   Location: [facility] OR;  Service: General;  Laterality: N/A;   LYMPH NODE BIOPSY Left 03/20/2020   Procedure: EXCISIONAL BIOPSY LEFT INGUINAL LYMPH NODE;  Surgeon: Vernetta Berg, MD;  Location: [facility] OR;  Service: General;  Laterality: Left;  LMA   PATENT FORAMEN OVALE(PFO) CLOSURE N/A 03/06/2022   Procedure: PATENT FORAMEN OVALE(PFO) CLOSURE;  Surgeon: Wendel Lurena POUR, MD;  Location: [facility] INVASIVE CV LAB;  Service: Cardiovascular;  Laterality: N/A;   SUTURE REMOVAL Left ~ 2016-2017 X 3   had to use permanent sutures w/my achilles OR; my body rejects them at times & I have to have them surgically removed; under anesthesia   TEE WITHOUT CARDIOVERSION N/A 12/04/2021   Procedure: TRANSESOPHAGEAL ECHOCARDIOGRAM (TEE);  Surgeon: Lonni Slain, MD;  Location: [facility] ENDOSCOPY;  Service: Cardiovascular;  Laterality: N/A;   UPPER GASTROINTESTINAL ENDOSCOPY     VASECTOMY Bilateral 07/2019   VENTRAL HERNIA REPAIR  1994    Current Outpatient Medications  Medication Sig Dispense Refill   acetaminophen  (TYLENOL ) 325 MG tablet Take 650 mg by mouth daily as needed for moderate pain or headache.     ALPRAZolam  (XANAX ) 0.25 MG tablet Take 0.25 mg by mouth 2 (two) times daily as needed for anxiety.     aspirin  EC 81 MG tablet Take 1 tablet (81 mg total) by mouth daily. Swallow whole. 90 tablet 12   cetirizine (ZYRTEC) 10 MG tablet Take 10 mg by mouth daily.     escitalopram  (LEXAPRO ) 10 MG tablet Take 10 mg by mouth daily.     FARXIGA  10 MG TABS tablet Take 1 tablet (10 mg total) by mouth daily. 30 tablet 3   ipratropium (ATROVENT ) 0.03 % nasal spray Place 2 sprays into both nostrils 2 (two) times daily.     lidocaine -prilocaine  (EMLA ) cream Apply 1 Application topically as needed. Apply to [facility] 1 hour prior to procedure. 30 g 3   metFORMIN  (GLUCOPHAGE ) 500 MG tablet Take 500 mg by mouth daily.     MOUNJARO  10 MG/0.5ML Pen Inject 10 mg into the skin once a week.     ondansetron  (ZOFRAN -ODT) 4 MG  disintegrating tablet Take 4 mg by mouth as needed.     rosuvastatin  (CRESTOR ) 40 MG tablet TAKE 1 TABLET BY MOUTH EVERY DAY 90 tablet 3   Current Facility-Administered Medications  Medication Dose Route Frequency Provider Last Rate Last Admin   0.9 %  sodium chloride  infusion  500 mL Intravenous Once Mansouraty, Aloha Raddle., MD        Allergies as of 02/29/2024 - Review Complete 02/11/2024  Allergen Reaction Noted   Mango flavoring agent (non-screening) Swelling and Other (See Comments) 01/17/2016   Rituximab  Other (See Comments) 12/15/2019   Shellfish allergy Anaphylaxis 11/12/2015   Lactose intolerance (gi) Diarrhea 11/12/2015    Family History  Problem Relation Age of Onset   Colon cancer Neg Hx    Colon polyps Neg Hx    Esophageal cancer Neg Hx    Stomach cancer Neg Hx    Rectal cancer Neg Hx    Inflammatory bowel disease Neg Hx    Liver disease Neg Hx    Pancreatic cancer Neg Hx  Social History   Socioeconomic History   Marital status: Married    Spouse name: Hartman Minahan   Number of children: 1   Years of education: Not on file   Highest education level: Not on file  Occupational History   Occupation: auditor  Tobacco Use   Smoking status: Former    Current packs/day: 0.00    Average packs/day: 1 pack/day for 30.0 years (30.0 ttl pk-yrs)    Types: Cigarettes    Start date: 05/06/1987    Quit date: 05/05/2017    Years since quitting: 6.8   Smokeless tobacco: Former    Types: Snuff    Quit date: 1997   Tobacco comments:    11/12/2015 quit using chew in ~ 1997  Vaping Use   Vaping status: Never Used  Substance and Sexual Activity   Alcohol  use: Not Currently   Drug use: Not Currently    Types: Marijuana    Comment: in the early 1990s; recreational   Sexual activity: Yes    Partners: Female  Other Topics Concern   Not on file  Social History Narrative   Not on file   Social Drivers of Health   Tobacco Use: Medium Risk (02/11/2024)   Patient  History    Smoking Tobacco Use: Former    Smokeless Tobacco Use: Former    Passive Exposure: Not on Actuary Strain: Not on file  Food Insecurity: No Food Insecurity (06/03/2022)   Hunger Vital Sign    Worried About Running Out of Food in the Last Year: Never true    Ran Out of Food in the Last Year: Never true  Transportation Needs: No Transportation Needs (06/03/2022)   PRAPARE - Administrator, Civil Service (Medical): No    Lack of Transportation (Non-Medical): No  Physical Activity: Not on file  Stress: Not on file  Social Connections: Not on file  Intimate Partner Violence: Not At Risk (06/03/2022)   Humiliation, Afraid, Rape, and Kick questionnaire    Fear of Current or Ex-Partner: No    Emotionally Abused: No    Physically Abused: No    Sexually Abused: No  Depression (PHQ2-9): Low Risk (01/05/2024)   Depression (PHQ2-9)    PHQ-2 Score: 0  Alcohol  Screen: Not on file  Housing: Low Risk (06/03/2022)   Housing    Last Housing Risk Score: 0  Utilities: Not At Risk (06/03/2022)   AHC Utilities    Threatened with loss of utilities: No  Health Literacy: Not on file    Review of Systems:    Constitutional: No weight loss Cardiovascular: No chest pain Respiratory: No SOB  Gastrointestinal: See HPI and otherwise negative   Physical Exam:  Vital signs: BP (!) 140/90   Pulse (!) 109   Ht 5' 11 (1.803 m)   Wt 261 lb (118.4 kg)   BMI 36.40 kg/m    Constitutional:   Pleasant overweight Caucasian male appears to be in NAD, Well developed, Well nourished, alert and cooperative Respiratory: Respirations even and unlabored. Lungs clear to auscultation bilaterally.   No wheezes, crackles, or rhonchi.  Cardiovascular: Normal S1, S2. No MRG. Regular rate and rhythm. No peripheral edema, cyanosis or pallor.  Gastrointestinal:  Soft, nondistended, nontender. No rebound or guarding. Normal bowel sounds. No appreciable masses or hepatomegaly. Rectal:  See  below Psychiatric: Oriented to person, place and time. Demonstrates good judgement and reason without abnormal affect or behaviors.   ANOSCOPY PROCEDURE REPORT:  INDICATION:  The  nature of anoscopy procedure and rationale for use was explained to the patient and they understood and agreed to proceed.  The lubricated lighted anoscope was inserted with the patient in the left lateral decubitus position and the anus and distal rectum was examined.  FINDINGS: External: Erythema around the rectum, some tenderness to palpation around the external rectum, no visible fissure or hemorrhoids; internal: Tenderness to palpation anteriorly most significant and posteriorly, no mass, no residue; anoscopy: Inserted with a large amount of a brownish-white discharge that poured from the rectum and obscured vision, very tender for the patient, swelling of tissue inside  RELEVANT LABS AND IMAGING: CBC    Component Value Date/Time   WBC 5.7 01/05/2024 1139   WBC 10.3 06/04/2022 0354   RBC 4.75 01/05/2024 1139   HGB 14.7 01/05/2024 1139   HGB 14.5 02/04/2022 1506   HGB 14.4 12/03/2016 1155   HGB 14.2 12/06/2015 1606   HCT 43.5 01/05/2024 1139   HCT 44.8 02/04/2022 1506   HCT 41.1 12/03/2016 1155   HCT 40.9 12/06/2015 1606   PLT 200 01/05/2024 1139   PLT 265 02/04/2022 1506   MCV 91.6 01/05/2024 1139   MCV 94 02/04/2022 1506   MCV 92 12/03/2016 1155   MCV 89.7 12/06/2015 1606   MCH 30.9 01/05/2024 1139   MCHC 33.8 01/05/2024 1139   RDW 12.5 01/05/2024 1139   RDW 13.2 02/04/2022 1506   RDW 12.9 12/03/2016 1155   RDW 13.0 12/06/2015 1606   LYMPHSABS 1.2 01/05/2024 1139   LYMPHSABS 1.5 08/10/2018 1604   LYMPHSABS 1.1 12/03/2016 1155   LYMPHSABS 3.1 12/06/2015 1606   MONOABS 0.6 01/05/2024 1139   MONOABS 0.7 12/06/2015 1606   EOSABS 0.1 01/05/2024 1139   EOSABS 0.2 08/10/2018 1604   EOSABS 0.3 12/03/2016 1155   BASOSABS 0.0 01/05/2024 1139   BASOSABS 0.0 08/10/2018 1604   BASOSABS 0.0  12/03/2016 1155   BASOSABS 0.0 12/06/2015 1606    CMP     Component Value Date/Time   NA 142 01/05/2024 1139   NA 143 02/04/2022 1506   NA 141 12/03/2016 1155   NA 141 02/07/2016 1149   K 4.2 01/05/2024 1139   K 3.8 12/03/2016 1155   K 4.3 02/07/2016 1149   CL 106 01/05/2024 1139   CL 100 12/03/2016 1155   CO2 25 01/05/2024 1139   CO2 27 12/03/2016 1155   CO2 27 02/07/2016 1149   GLUCOSE 115 (H) 01/05/2024 1139   GLUCOSE 128 (H) 12/03/2016 1155   BUN 11 01/05/2024 1139   BUN 11 02/04/2022 1506   BUN 10 12/03/2016 1155   BUN 10.9 02/07/2016 1149   CREATININE 1.04 01/05/2024 1139   CREATININE 1.0 12/03/2016 1155   CREATININE 0.9 02/07/2016 1149   CALCIUM  9.1 01/05/2024 1139   CALCIUM  9.1 12/03/2016 1155   CALCIUM  9.6 02/07/2016 1149   PROT 6.8 01/05/2024 1139   PROT 6.5 12/18/2020 0817   PROT 7.1 12/03/2016 1155   PROT 7.4 02/07/2016 1149   ALBUMIN 4.4 01/05/2024 1139   ALBUMIN 4.4 12/18/2020 0817   ALBUMIN 3.9 02/07/2016 1149   AST 26 01/05/2024 1139   AST 17 02/07/2016 1149   ALT 22 01/05/2024 1139   ALT 24 12/03/2016 1155   ALT 16 02/07/2016 1149   ALKPHOS 120 01/05/2024 1139   ALKPHOS 99 (H) 12/03/2016 1155   ALKPHOS 127 02/07/2016 1149   BILITOT 0.6 01/05/2024 1139   BILITOT 0.42 02/07/2016 1149   GFRNONAA >60 01/05/2024 1139  GFRAA 107 03/14/2020 0839   GFRAA >60 10/31/2019 0850    Assessment and Plan Assessment & Plan Grade II internal hemorrhoids He has acutely worsened symptoms post-colonoscopy, including persistent pain, rectal pressure, and bleeding refractory to conservative management.  An anoscopy today revealed brownish-white discharge concerning for possible abscess. - Prescribed lidocaine  ointment as needed for rectal pain. - Prescribed Tramadol  50 mg every 4-6 hours as needed for pain, #15, no refills. - Recommended daily stool softener to maintain soft, regular bowel movements.  Suspected anorectal abscess or complication Escalation of  pain, pressure, mucous discharge, and intermittent fever following colonoscopy raises concern for an acute infectious or inflammatory process. - Ordered STAT CT pelvis with and without contrast to evaluate for abscess or other complication. - Ordered CBC and CMP. - Will consider exam under anesthesia and/or antibiotics pending CT results.  Fever associated with anorectal symptoms Intermittent fevers temporally associated with exacerbations of anorectal symptoms suggest underlying infection or inflammation. - Ordered CBC and CMP to evaluate for infection or systemic inflammation. - Will consider antibiotics pending CT and laboratory results.  Patient to follow in clinic per recommendations after imaging and labs above.  Will likely need to consider rescheduling his banding appointment next week.  Delon Failing, PA-C [facility] 02/29/2024, 11:22 AM  Cc: Timmy Maude SAUNDERS, MD  "

## 2024-02-29 NOTE — Patient Instructions (Addendum)
 We have sent the following medications to your pharmacy for you to pick up at your convenience: Tramadol , Lidocaine    Continue or start daily stool softener as needed.    Your provider has requested that you go to the basement level for lab work before leaving today. Press B on the elevator. The lab is located at the first door on the left as you exit the elevator.  You have been scheduled for a CT scan of the abdomen and pelvis at [facility], 1st floor Radiology. You are scheduled on 03/01/24 at 10:00 am . You should arrive  at 7:45 am to your appointment time for registration and to drink oral contrast.   Please follow the written instructions below on the day of your exam:   1) Do not eat anything after 4:00 am  (4 hours prior to your test)   You may take any medications as prescribed with a small amount of water, if necessary. If you take any of the following medications: METFORMIN , GLUCOPHAGE , GLUCOVANCE, AVANDAMET, RIOMET , FORTAMET , ACTOPLUS MET, JANUMET, GLUMETZA  or METAGLIP, you MAY be asked to HOLD this medication 48 hours AFTER the exam.   The purpose of you drinking the oral contrast is to aid in the visualization of your intestinal tract. The contrast solution may cause some diarrhea. Depending on your individual set of symptoms, you may also receive an intravenous injection of x-ray contrast/dye. Plan on being at [facility] for 45 minutes or longer, depending on the type of exam you are having performed.   If you have any questions regarding your exam or if you need to reschedule, you may call [facility] at [phone] between the hours of 8:00 am and 5:00 pm, Monday-Friday.   _______________________________________________________  If your blood pressure at your visit was 140/90 or greater, please contact your primary care physician to follow up on this.  _______________________________________________________  If you are age 55 or older, your body mass  index should be between 23-30. Your Body mass index is 36.4 kg/m. If this is out of the aforementioned range listed, please consider follow up with your Primary Care Provider.  If you are age 50 or younger, your body mass index should be between 19-25. Your Body mass index is 36.4 kg/m. If this is out of the aformentioned range listed, please consider follow up with your Primary Care Provider.   ________________________________________________________  The [facility] providers would like to encourage you to use MYCHART to communicate with providers for non-urgent requests or questions.  Due to long hold times on the telephone, sending your provider a message by [facility] may be a faster and more efficient way to get a response.  Please allow 48 business hours for a response.  Please remember that this is for non-urgent requests.  _______________________________________________________  [facility] is using a team-based approach to care.  Your team is made up of your doctor and two to three APPS. Our APPS (Nurse Practitioners and Physician Assistants) work with your physician to ensure care continuity for you. They are fully qualified to address your health concerns and develop a treatment plan. They communicate directly with your gastroenterologist to care for you. Seeing the Advanced Practice Practitioners on your physician's team can help you by facilitating care more promptly, often allowing for earlier appointments, access to diagnostic testing, procedures, and other specialty referrals.   _______________________________________________________  If your blood pressure at your visit was 140/90 or greater, please contact your primary care physician to  follow up on this.  _______________________________________________________  If you are age 39 or older, your body mass index should be between 23-30. Your Body mass index is 36.4 kg/m. If this is out of the aforementioned range listed,  please consider follow up with your Primary Care Provider.  If you are age 63 or younger, your body mass index should be between 19-25. Your Body mass index is 36.4 kg/m. If this is out of the aformentioned range listed, please consider follow up with your Primary Care Provider.   ________________________________________________________  The [facility] providers would like to encourage you to use MYCHART to communicate with providers for non-urgent requests or questions.  Due to long hold times on the telephone, sending your provider a message by [facility] may be a faster and more efficient way to get a response.  Please allow 48 business hours for a response.  Please remember that this is for non-urgent requests.  _______________________________________________________  [facility] is using a team-based approach to care.  Your team is made up of your doctor and two to three APPS. Our APPS (Nurse Practitioners and Physician Assistants) work with your physician to ensure care continuity for you. They are fully qualified to address your health concerns and develop a treatment plan. They communicate directly with your gastroenterologist to care for you. Seeing the Advanced Practice Practitioners on your physician's team can help you by facilitating care more promptly, often allowing for earlier appointments, access to diagnostic testing, procedures, and other specialty referrals.   Thank you for choosing me and [facility].  Delon Failing, PA-C

## 2024-03-01 ENCOUNTER — Other Ambulatory Visit: Payer: Self-pay | Admitting: Physician Assistant

## 2024-03-01 ENCOUNTER — Ambulatory Visit (HOSPITAL_COMMUNITY)
Admission: RE | Admit: 2024-03-01 | Discharge: 2024-03-01 | Disposition: A | Discharge status: Home / Self Care | Source: Ambulatory Visit | Attending: Physician Assistant | Admitting: Physician Assistant

## 2024-03-01 DIAGNOSIS — R509 Fever, unspecified: Secondary | ICD-10-CM | POA: Insufficient documentation

## 2024-03-01 DIAGNOSIS — K6289 Other specified diseases of anus and rectum: Secondary | ICD-10-CM

## 2024-03-01 DIAGNOSIS — K625 Hemorrhage of anus and rectum: Secondary | ICD-10-CM | POA: Diagnosis present

## 2024-03-01 MED ORDER — IOHEXOL 9 MG/ML PO SOLN
1000.0000 mL | ORAL | Status: AC
  Administered 2024-03-01: 1000 mL via ORAL

## 2024-03-01 MED ORDER — IOHEXOL 300 MG/ML  SOLN
100.0000 mL | Freq: Once | INTRAMUSCULAR | Status: AC | PRN
  Administered 2024-03-01: 100 mL via INTRAVENOUS

## 2024-03-01 MED ORDER — HEPARIN SOD (PORK) LOCK FLUSH 100 UNIT/ML IV SOLN
INTRAVENOUS | Status: AC
  Filled 2024-03-01: qty 5

## 2024-03-01 MED ORDER — HEPARIN SOD (PORK) LOCK FLUSH 100 UNIT/ML IV SOLN
500.0000 [IU] | Freq: Once | INTRAVENOUS | Status: AC
  Administered 2024-03-01: 500 [IU] via INTRAVENOUS

## 2024-03-02 ENCOUNTER — Encounter: Payer: Self-pay | Admitting: Hematology & Oncology

## 2024-03-02 ENCOUNTER — Other Ambulatory Visit: Payer: Self-pay

## 2024-03-02 ENCOUNTER — Ambulatory Visit: Payer: Self-pay | Admitting: Physician Assistant

## 2024-03-02 ENCOUNTER — Observation Stay (HOSPITAL_COMMUNITY)
Admission: EM | Admit: 2024-03-02 | Discharge: 2024-03-03 | Disposition: A | Discharge status: Home / Self Care | Source: Ambulatory Visit

## 2024-03-02 ENCOUNTER — Telehealth: Payer: Self-pay

## 2024-03-02 DIAGNOSIS — K611 Rectal abscess: Principal | ICD-10-CM | POA: Diagnosis present

## 2024-03-02 LAB — CBC WITH DIFFERENTIAL/PLATELET
Abs Immature Granulocytes: 0.01 10*3/uL (ref 0.00–0.07)
Basophils Absolute: 0 10*3/uL (ref 0.0–0.1)
Basophils Relative: 1 %
Eosinophils Absolute: 0.2 10*3/uL (ref 0.0–0.5)
Eosinophils Relative: 4 %
HCT: 39.8 % (ref 39.0–52.0)
Hemoglobin: 13.5 g/dL (ref 13.0–17.0)
Immature Granulocytes: 0 %
Lymphocytes Relative: 28 %
Lymphs Abs: 1.7 10*3/uL (ref 0.7–4.0)
MCH: 30.8 pg (ref 26.0–34.0)
MCHC: 33.9 g/dL (ref 30.0–36.0)
MCV: 90.7 fL (ref 80.0–100.0)
Monocytes Absolute: 0.8 10*3/uL (ref 0.1–1.0)
Monocytes Relative: 13 %
Neutro Abs: 3.3 10*3/uL (ref 1.7–7.7)
Neutrophils Relative %: 54 %
Platelets: 224 10*3/uL (ref 150–400)
RBC: 4.39 MIL/uL (ref 4.22–5.81)
RDW: 13 % (ref 11.5–15.5)
WBC: 6 10*3/uL (ref 4.0–10.5)
nRBC: 0 % (ref 0.0–0.2)

## 2024-03-02 LAB — BASIC METABOLIC PANEL WITH GFR
Anion gap: 10 (ref 5–15)
BUN: 10 mg/dL (ref 6–20)
CO2: 27 mmol/L (ref 22–32)
Calcium: 8.9 mg/dL (ref 8.9–10.3)
Chloride: 101 mmol/L (ref 98–111)
Creatinine, Ser: 0.91 mg/dL (ref 0.61–1.24)
GFR, Estimated: >60 mL/min
Glucose, Bld: 109 mg/dL — ABNORMAL HIGH (ref 70–99)
Potassium: 3.7 mmol/L (ref 3.5–5.1)
Sodium: 138 mmol/L (ref 135–145)

## 2024-03-02 LAB — GLUCOSE, CAPILLARY
Glucose-Capillary: 93 mg/dL (ref 70–99)
Glucose-Capillary: 109 mg/dL — ABNORMAL HIGH (ref 70–99)

## 2024-03-02 LAB — CBG MONITORING, ED: Glucose-Capillary: 122 mg/dL — ABNORMAL HIGH (ref 70–99)

## 2024-03-02 MED ORDER — ALPRAZOLAM 0.25 MG PO TABS: 0.2500 mg | ORAL_TABLET | Freq: Two times a day (BID) | ORAL | Status: DC | PRN

## 2024-03-02 MED ORDER — ESCITALOPRAM OXALATE 10 MG PO TABS: 10.0000 mg | ORAL_TABLET | Freq: Every day | ORAL | Status: DC

## 2024-03-02 MED ORDER — DIPHENHYDRAMINE HCL 50 MG/ML IJ SOLN: 12.5000 mg | Freq: Four times a day (QID) | INTRAMUSCULAR | Status: DC | PRN

## 2024-03-02 MED ORDER — ONDANSETRON 4 MG PO TBDP: 4.0000 mg | ORAL_TABLET | Freq: Four times a day (QID) | ORAL | Status: DC | PRN

## 2024-03-02 MED ORDER — IPRATROPIUM BROMIDE 0.03 % NA SOLN: 2.0000 | Freq: Two times a day (BID) | NASAL | Status: DC | PRN

## 2024-03-02 MED ORDER — OXYCODONE HCL 5 MG PO TABS: 5.0000 mg | ORAL_TABLET | Freq: Four times a day (QID) | ORAL | Status: DC | PRN

## 2024-03-02 MED ORDER — ESCITALOPRAM OXALATE 10 MG PO TABS
5.0000 mg | ORAL_TABLET | Freq: Every day | ORAL | Status: DC
  Administered 2024-03-03: 5 mg via ORAL
  Filled 2024-03-02 (×2): qty 1

## 2024-03-02 MED ORDER — SODIUM CHLORIDE 0.9% FLUSH
10.0000 mL | Freq: Two times a day (BID) | INTRAVENOUS | Status: DC
  Administered 2024-03-02 – 2024-03-03 (×3): 10 mL

## 2024-03-02 MED ORDER — PIPERACILLIN-TAZOBACTAM 3.375 G IVPB
3.3750 g | Freq: Three times a day (TID) | INTRAVENOUS | Status: DC
  Administered 2024-03-02 – 2024-03-03 (×3): 3.375 g via INTRAVENOUS
  Filled 2024-03-02 (×4): qty 50

## 2024-03-02 MED ORDER — INSULIN ASPART 100 UNIT/ML IJ SOLN
0.0000 [IU] | INTRAMUSCULAR | Status: DC
  Administered 2024-03-03: 2 [IU] via SUBCUTANEOUS
  Filled 2024-03-02: qty 2

## 2024-03-02 MED ORDER — POLYETHYLENE GLYCOL 3350 17 G PO PACK: 17.0000 g | PACK | Freq: Every day | ORAL | Status: DC | PRN

## 2024-03-02 MED ORDER — CHLORHEXIDINE GLUCONATE CLOTH 2 % EX PADS
6.0000 | MEDICATED_PAD | Freq: Every day | CUTANEOUS | Status: DC
  Administered 2024-03-03: 6 via TOPICAL

## 2024-03-02 MED ORDER — ACETAMINOPHEN 650 MG RE SUPP: 650.0000 mg | Freq: Four times a day (QID) | RECTAL | Status: DC | PRN

## 2024-03-02 MED ORDER — AMOXICILLIN-POT CLAVULANATE 875-125 MG PO TABS: 1.0000 | ORAL_TABLET | Freq: Two times a day (BID) | ORAL | 0 refills | Status: AC

## 2024-03-02 MED ORDER — ENOXAPARIN SODIUM 40 MG/0.4ML IJ SOSY
40.0000 mg | PREFILLED_SYRINGE | INTRAMUSCULAR | Status: DC
  Administered 2024-03-02: 40 mg via SUBCUTANEOUS
  Filled 2024-03-02: qty 0.4

## 2024-03-02 MED ORDER — MUPIROCIN 2 % EX OINT
1.0000 | TOPICAL_OINTMENT | Freq: Two times a day (BID) | CUTANEOUS | Status: DC
  Administered 2024-03-02 – 2024-03-03 (×2): 1 via NASAL
  Filled 2024-03-02 (×2): qty 22

## 2024-03-02 MED ORDER — KCL IN DEXTROSE-NACL 20-5-0.45 MEQ/L-%-% IV SOLN
INTRAVENOUS | Status: DC
  Filled 2024-03-02 (×2): qty 1000

## 2024-03-02 MED ORDER — ACETAMINOPHEN 325 MG PO TABS: 650.0000 mg | ORAL_TABLET | Freq: Four times a day (QID) | ORAL | Status: DC | PRN

## 2024-03-02 MED ORDER — SIMETHICONE 80 MG PO CHEW: 40.0000 mg | CHEWABLE_TABLET | Freq: Four times a day (QID) | ORAL | Status: DC | PRN

## 2024-03-02 MED ORDER — DIPHENHYDRAMINE HCL 12.5 MG/5ML PO ELIX: 12.5000 mg | ORAL_SOLUTION | Freq: Four times a day (QID) | ORAL | Status: DC | PRN

## 2024-03-02 MED ORDER — ONDANSETRON HCL 4 MG/2ML IJ SOLN: 4.0000 mg | Freq: Four times a day (QID) | INTRAMUSCULAR | Status: DC | PRN

## 2024-03-02 MED ORDER — METOPROLOL TARTRATE 5 MG/5ML IV SOLN: 5.0000 mg | Freq: Four times a day (QID) | INTRAVENOUS | Status: DC | PRN

## 2024-03-02 MED ORDER — MORPHINE SULFATE (PF) 2 MG/ML IV SOLN: 2.0000 mg | INTRAVENOUS | Status: DC | PRN

## 2024-03-02 NOTE — ED Triage Notes (Signed)
 Patient reports rectal pain for 3 weeks, had CT done which showed rectal abscess, fevers, nausea. Patient is alert and oriented x 4. Airway patent, respirations even and unlabored. Skin normal, warm and dry.

## 2024-03-02 NOTE — H&P (Addendum)
 "  Reason for Consult:perianal pain Referring Provider: Aloha Jennings  James Jennings is an 54 y.o. male.  HPI: 54 yo male with 3 weeks of rectal pain. Pain started shortly after endoscopy. Pain is worse with defecation and he sometimes feels febrile after bowel movements. Pain is sharp in nature. He has some drainage today and is wearing a pad. He last ate solid food 2 hours ago.  Past Medical History:  Diagnosis Date   Allergy    Anxiety    Bilateral swelling of feet    Diabetes mellitus without complication ([name])    High cholesterol    Hypogammaglobulinemia 06/15/2022   Joint pain    Lactose intolerance    Large cell, follicular non-Hodgkin's lymphoma ([name])    Lymphoma, follicular ([name]) dx'd 01/2016   Multiple food allergies    Other fatigue    Palpitations    PONV (postoperative nausea and vomiting)    Pre-diabetes    Prediabetes    Shortness of breath on exertion    Sleep apnea    dx'd ~ 2008; never RX'd mask (02/28/2016)   Stroke ([name])    12-01-21    Past Surgical History:  Procedure Laterality Date   ACHILLES TENDON SURGERY Left ~ 2012   APPENDECTOMY  11/12/2015   lap appy   BUBBLE STUDY  12/04/2021   Procedure: BUBBLE STUDY;  Surgeon: James Slain, MD;  Location: [facility] ENDOSCOPY;  Service: Cardiovascular;;   CLUB FOOT RELEASE Bilateral 1971   COLONOSCOPY     HERNIA REPAIR     INGUINAL LYMPH NODE BIOPSY Right 01/20/2016   Procedure: EXCISIONAL BIOPSY OF RIGHT INGUINAL LYMPH NODE;  Surgeon: James Blush, MD;  Location: [facility] OR;  Service: General;  Laterality: Right;   IR IMAGING GUIDED PORT INSERTION  11/02/2019   LAPAROSCOPIC APPENDECTOMY N/A 11/12/2015   Procedure: APPENDECTOMY LAPAROSCOPIC;  Surgeon: James Blush, MD;  Location: [facility] OR;  Service: General;  Laterality: N/A;   LYMPH NODE BIOPSY Left 03/20/2020   Procedure: EXCISIONAL BIOPSY LEFT INGUINAL LYMPH NODE;  Surgeon: James Berg, MD;  Location: [facility] OR;  Service: General;  Laterality: Left;  LMA    PATENT FORAMEN OVALE(PFO) CLOSURE N/A 03/06/2022   Procedure: PATENT FORAMEN OVALE(PFO) CLOSURE;  Surgeon: James Lurena POUR, MD;  Location: [facility] INVASIVE CV LAB;  Service: Cardiovascular;  Laterality: N/A;   SUTURE REMOVAL Left ~ 2016-2017 X 3   had to use permanent sutures w/my achilles OR; my body rejects them at times & I have to have them surgically removed; under anesthesia   TEE WITHOUT CARDIOVERSION N/A 12/04/2021   Procedure: TRANSESOPHAGEAL ECHOCARDIOGRAM (TEE);  Surgeon: James Slain, MD;  Location: [facility] ENDOSCOPY;  Service: Cardiovascular;  Laterality: N/A;   UPPER GASTROINTESTINAL ENDOSCOPY     VASECTOMY Bilateral 07/2019   VENTRAL HERNIA REPAIR  1994    Family History  Problem Relation Age of Onset   Colon cancer Neg Hx    Colon polyps Neg Hx    Esophageal cancer Neg Hx    Stomach cancer Neg Hx    Rectal cancer Neg Hx    Inflammatory bowel disease Neg Hx    Liver disease Neg Hx    Pancreatic cancer Neg Hx     Social History:  reports that he quit smoking about 6 years ago. His smoking use included cigarettes. He started smoking about 36 years ago. He has a 30 pack-year smoking history. He quit smokeless tobacco use about 29 years ago.  His smokeless tobacco use included snuff. He  reports that he does not currently use alcohol . He reports that he does not currently use drugs after having used the following drugs: Marijuana.  Allergies: Allergies[1]  Medications: I have reviewed the patient's current medications.  No results found for this or any previous visit (from the past 48 hours).  PE Blood pressure (!) 139/90, pulse 86, temperature 98 F (36.7 C), temperature source Oral, resp. rate 18, SpO2 98%. Constitutional: NAD; conversant; no deformities Eyes: Moist conjunctiva; no lid lag; anicteric; PERRL Neck: Trachea midline; no thyromegaly Lungs: Normal respiratory effort; no tactile fremitus CV: RRR; no palpable thrills; no pitting edema GI: Abd soft; no  palpable hepatosplenomegaly MSK: Normal gait; no clubbing/cyanosis Rectal: tenderness throughout, no easily appreciable fluctuance though so deeper firmness on the right Psychiatric: Appropriate affect; alert and oriented x3 Lymphatic: No palpable cervical or axillary lymphadenopathy Skin: No major subcutaneous nodules. Warm and dry   Assessment/Plan: 54 yo male with perirectal abscess -observe on surgical floor for antibiotics -plan for OR tomorrow for I+D perirectal abscess  Diabetes Mellitus -sliding scale while in hospital -hold metformin  and farxiga  -carb modified diet tonight and NPO for procedure tomorrow  Anxiety -resume lexapro  and benzo  I reviewed last 24 h vitals and pain scores, last 48 h intake and output, last 24 h labs and trends, and last 24 h imaging results.  This care required moderate level of medical decision making.   James Jennings James Jennings 03/02/2024, 3:48 PM     [1]  Allergies Allergen Reactions   Mango Flavoring Agent (Non-Screening) Swelling and Other (See Comments)    LIPS SWELL   Rituximab  Other (See Comments)    Blood pressure went extremely low.    Shellfish Allergy Anaphylaxis   Lactose Intolerance (Gi) Diarrhea   "

## 2024-03-02 NOTE — Progress Notes (Signed)
 Delon please advise (see Dr Mansouraty's note)

## 2024-03-02 NOTE — Progress Notes (Signed)
 Pt arrived to unit, alert and oriented x4, stable, denies pain, IV antibiotics infusing, paged on call services for [facility] for clarification on diet orders. Per provider okay for patient to have clear liquids and NPO at midnight. Provider disconnected call before this RN was able to obtain name of on call provider. Attempted to return phone call. Unable to reach or obtain name.

## 2024-03-02 NOTE — Telephone Encounter (Signed)
-----   Message from [place], [place] sent at 03/02/2024  9:12 AM EST ----- Regarding: See messages Please ensure that patient has been called.  Antibiotics need to be started, banding needs to be canceled and stat referral to surgical team.  Thanks, JL L ----- Message ----- From: [place] Aloha Raddle., MD Sent: 03/02/2024   6:01 AM EST To: Delon Hendricks Failing, PA; Sandor Flatter V#  VC, Agree with this plan of action. We will need to hold off on hemorrhoidal banding.  JLL, Have you communicated this to the patient or do you need Pod C nurses to? Agree with the need for colorectal surgery evaluation and antibiotic therapy. Would start him on Augmentin  875 mg twice daily x 14 days in the interim. GM ----- Message ----- From: Flatter Sandor GAILS, DO Sent: 03/01/2024   1:28 PM EST To: Delon Hendricks Failing, PA; Aloha Mansourat#  CT pelvis was done earlier today and does look like an abscess.  Based on anoscopy description, hopefully this is now draining internally, but I recommend course of antibiotics and would consider sending over to the Colorectal Surgery for expedited evaluation as well.  Recommend canceling next weeks hemorrhoid banding.  Sandor Flatter, DO, [facility] ----- Message ----- From: [place] Aloha Raddle., MD Sent: 02/29/2024   4:32 PM EST To: Delon Hendricks Failing, PA; Sandor Flatter V#

## 2024-03-02 NOTE — Telephone Encounter (Signed)
 See My Chart message for further communication.  Referral made to [facility], prescription sent, banding cancelled.

## 2024-03-02 NOTE — ED Provider Triage Note (Signed)
 Emergency Medicine Provider Triage Evaluation Note  James Jennings , a 54 y.o. male  was evaluated in triage.  Pt complains of a anorectal abscess.  Patient reportedly had imaging today that showed an abscess in the rectum causing pain.  Report has been having intermittent fevers off and on for the last several days.  Has had some nausea but no reported vomiting.  States that he had imaging today and was advised come in the emergency department for evaluation and management by surgery.  Review of Systems  Positive: As above Negative: As above  Physical Exam  BP (!) 139/90 (BP Location: Left Arm)   Pulse 86   Temp 98 F (36.7 C) (Oral)   Resp 18   SpO2 98%  Gen:   Awake, no distress   Resp:  Normal effort  MSK:   Moves extremities without difficulty  Other:    Medical Decision Making  Medically screening exam initiated at 3:48 PM.  Appropriate orders placed.  James Jennings was informed that the remainder of the evaluation will be completed by another provider, this initial triage assessment does not replace that evaluation, and the importance of remaining in the ED until their evaluation is complete.     James Jennings A, PA-C 03/02/24 1549

## 2024-03-03 ENCOUNTER — Observation Stay (HOSPITAL_COMMUNITY): Admitting: Certified Registered Nurse Anesthetist

## 2024-03-03 ENCOUNTER — Encounter (HOSPITAL_COMMUNITY): Payer: Self-pay

## 2024-03-03 ENCOUNTER — Encounter (HOSPITAL_COMMUNITY): Admission: EM | Disposition: A | Discharge status: Home / Self Care | Payer: Self-pay | Source: Ambulatory Visit

## 2024-03-03 LAB — COMPREHENSIVE METABOLIC PANEL WITH GFR
ALT: 14 U/L (ref 0–44)
AST: 16 U/L (ref 15–41)
Albumin: 3.6 g/dL (ref 3.5–5.0)
Alkaline Phosphatase: 102 U/L (ref 38–126)
Anion gap: 8 (ref 5–15)
BUN: 9 mg/dL (ref 6–20)
CO2: 28 mmol/L (ref 22–32)
Calcium: 8.8 mg/dL — ABNORMAL LOW (ref 8.9–10.3)
Chloride: 103 mmol/L (ref 98–111)
Creatinine, Ser: 0.91 mg/dL (ref 0.61–1.24)
GFR, Estimated: >60 mL/min
Glucose, Bld: 146 mg/dL — ABNORMAL HIGH (ref 70–99)
Potassium: 3.6 mmol/L (ref 3.5–5.1)
Sodium: 139 mmol/L (ref 135–145)
Total Bilirubin: 0.5 mg/dL (ref 0.0–1.2)
Total Protein: 5.7 g/dL — ABNORMAL LOW (ref 6.5–8.1)

## 2024-03-03 LAB — CBC
HCT: 38.8 % — ABNORMAL LOW (ref 39.0–52.0)
Hemoglobin: 13.1 g/dL (ref 13.0–17.0)
MCH: 30.9 pg (ref 26.0–34.0)
MCHC: 33.8 g/dL (ref 30.0–36.0)
MCV: 91.5 fL (ref 80.0–100.0)
Platelets: 209 10*3/uL (ref 150–400)
RBC: 4.24 MIL/uL (ref 4.22–5.81)
RDW: 13.1 % (ref 11.5–15.5)
WBC: 4.7 10*3/uL (ref 4.0–10.5)
nRBC: 0 % (ref 0.0–0.2)

## 2024-03-03 LAB — SURGICAL PCR SCREEN
MRSA, PCR: NEGATIVE
Staphylococcus aureus: NEGATIVE

## 2024-03-03 LAB — HEMOGLOBIN A1C
Hgb A1c MFr Bld: 6.4 % — ABNORMAL HIGH (ref 4.8–5.6)
Mean Plasma Glucose: 136.98 mg/dL

## 2024-03-03 LAB — GLUCOSE, CAPILLARY
Glucose-Capillary: 133 mg/dL — ABNORMAL HIGH (ref 70–99)
Glucose-Capillary: 142 mg/dL — ABNORMAL HIGH (ref 70–99)
Glucose-Capillary: 127 mg/dL — ABNORMAL HIGH (ref 70–99)
Glucose-Capillary: 107 mg/dL — ABNORMAL HIGH (ref 70–99)
Glucose-Capillary: 93 mg/dL (ref 70–99)

## 2024-03-03 MED ORDER — ORAL CARE MOUTH RINSE: 15.0000 mL | Freq: Once | OROMUCOSAL | Status: AC

## 2024-03-03 MED ORDER — HYDROMORPHONE HCL 1 MG/ML IJ SOLN
INTRAMUSCULAR | Status: AC
  Filled 2024-03-03: qty 1

## 2024-03-03 MED ORDER — ACETAMINOPHEN 10 MG/ML IV SOLN
INTRAVENOUS | Status: AC
  Filled 2024-03-03: qty 100

## 2024-03-03 MED ORDER — ACETAMINOPHEN 500 MG PO TABS: 1000.0000 mg | ORAL_TABLET | Freq: Four times a day (QID) | ORAL | Status: AC | PRN

## 2024-03-03 MED ORDER — BUPIVACAINE-EPINEPHRINE (PF) 0.25% -1:200000 IJ SOLN
INTRAMUSCULAR | Status: AC
  Filled 2024-03-03: qty 30

## 2024-03-03 MED ORDER — FENTANYL CITRATE (PF) 100 MCG/2ML IJ SOLN
INTRAMUSCULAR | Status: DC | PRN
  Administered 2024-03-03: 50 ug via INTRAVENOUS
  Administered 2024-03-03: 100 ug via INTRAVENOUS
  Administered 2024-03-03: 50 ug via INTRAVENOUS

## 2024-03-03 MED ORDER — FENTANYL CITRATE (PF) 100 MCG/2ML IJ SOLN
INTRAMUSCULAR | Status: AC
  Filled 2024-03-03: qty 2

## 2024-03-03 MED ORDER — OXYCODONE HCL 5 MG PO TABS: 5.0000 mg | ORAL_TABLET | Freq: Once | ORAL | Status: DC | PRN

## 2024-03-03 MED ORDER — DROPERIDOL 2.5 MG/ML IJ SOLN: 0.6250 mg | Freq: Once | INTRAMUSCULAR | Status: DC | PRN

## 2024-03-03 MED ORDER — HEPARIN SOD (PORK) LOCK FLUSH 100 UNIT/ML IV SOLN
500.0000 [IU] | INTRAVENOUS | Status: AC | PRN
  Administered 2024-03-03: 500 [IU]
  Filled 2024-03-03: qty 5

## 2024-03-03 MED ORDER — MIDAZOLAM HCL (PF) 2 MG/2ML IJ SOLN
INTRAMUSCULAR | Status: DC | PRN
  Administered 2024-03-03: 2 mg via INTRAVENOUS

## 2024-03-03 MED ORDER — CHLORHEXIDINE GLUCONATE 0.12 % MT SOLN
15.0000 mL | Freq: Once | OROMUCOSAL | Status: AC
  Administered 2024-03-03: 15 mL via OROMUCOSAL

## 2024-03-03 MED ORDER — IBUPROFEN 400 MG PO TABS: 400.0000 mg | ORAL_TABLET | Freq: Four times a day (QID) | ORAL | Status: AC | PRN

## 2024-03-03 MED ORDER — POLYETHYLENE GLYCOL 3350 17 G PO PACK: 17.0000 g | PACK | Freq: Every day | ORAL | Status: AC | PRN

## 2024-03-03 MED ORDER — ACETAMINOPHEN 10 MG/ML IV SOLN
1000.0000 mg | Freq: Once | INTRAVENOUS | Status: DC | PRN
  Administered 2024-03-03: 1000 mg via INTRAVENOUS

## 2024-03-03 MED ORDER — PROPOFOL 10 MG/ML IV BOLUS
INTRAVENOUS | Status: AC
  Filled 2024-03-03: qty 20

## 2024-03-03 MED ORDER — LACTATED RINGERS IV SOLN: INTRAVENOUS | Status: DC

## 2024-03-03 MED ORDER — LIDOCAINE 2% (20 MG/ML) 5 ML SYRINGE
INTRAMUSCULAR | Status: DC | PRN
  Administered 2024-03-03: 60 mg via INTRAVENOUS

## 2024-03-03 MED ORDER — OXYCODONE HCL 5 MG/5ML PO SOLN: 5.0000 mg | Freq: Once | ORAL | Status: DC | PRN

## 2024-03-03 MED ORDER — 0.9 % SODIUM CHLORIDE (POUR BTL) OPTIME
TOPICAL | Status: DC | PRN
  Administered 2024-03-03: 1000 mL

## 2024-03-03 MED ORDER — ONDANSETRON HCL 4 MG/2ML IJ SOLN
INTRAMUSCULAR | Status: AC
  Filled 2024-03-03: qty 2

## 2024-03-03 MED ORDER — PROPOFOL 10 MG/ML IV BOLUS
INTRAVENOUS | Status: DC | PRN
  Administered 2024-03-03: 200 mg via INTRAVENOUS

## 2024-03-03 MED ORDER — MEPERIDINE HCL 25 MG/ML IJ SOLN: 6.2500 mg | INTRAMUSCULAR | Status: DC | PRN

## 2024-03-03 MED ORDER — BUPIVACAINE-EPINEPHRINE (PF) 0.25% -1:200000 IJ SOLN
INTRAMUSCULAR | Status: DC | PRN
  Administered 2024-03-03: 20 mL

## 2024-03-03 MED ORDER — IBUPROFEN 200 MG PO TABS
400.0000 mg | ORAL_TABLET | Freq: Four times a day (QID) | ORAL | Status: DC
  Administered 2024-03-03: 400 mg via ORAL
  Filled 2024-03-03: qty 2

## 2024-03-03 MED ORDER — MIDAZOLAM HCL 2 MG/2ML IJ SOLN
INTRAMUSCULAR | Status: AC
  Filled 2024-03-03: qty 2

## 2024-03-03 MED ORDER — ONDANSETRON HCL 4 MG/2ML IJ SOLN
INTRAMUSCULAR | Status: DC | PRN
  Administered 2024-03-03: 4 mg via INTRAVENOUS

## 2024-03-03 MED ORDER — INSULIN ASPART 100 UNIT/ML IJ SOLN: 0.0000 [IU] | INTRAMUSCULAR | Status: DC | PRN

## 2024-03-03 MED ORDER — HYDROMORPHONE HCL 1 MG/ML IJ SOLN
0.2500 mg | INTRAMUSCULAR | Status: DC | PRN
  Administered 2024-03-03 (×3): 0.5 mg via INTRAVENOUS

## 2024-03-03 NOTE — Transfer of Care (Signed)
 Immediate Anesthesia Transfer of Care Note  Patient: James Jennings  Procedure(s) Performed: INCISION AND DRAINAGE, ABSCESS, PERIRECTAL  Patient Location: PACU  Anesthesia Type:General  Level of Consciousness: sedated, patient cooperative, and responds to stimulation  Airway & Oxygen  Therapy: Patient Spontanous Breathing and Patient connected to face mask oxygen   Post-op Assessment: Report given to RN and Post -op Vital signs reviewed and stable  Post vital signs: Reviewed and stable  Last Vitals:  Vitals Value Taken Time  BP 137/76 03/03/24 11:31  Temp    Pulse 74 03/03/24 11:33  Resp 9 03/03/24 11:33  SpO2 100 % 03/03/24 11:33  Vitals shown include unfiled device data.  Last Pain:  Vitals:   03/03/24 0904  TempSrc: Oral  PainSc: 0-No pain      Patients Stated Pain Goal: 4 (03/03/24 0904)  Complications: No notable events documented.

## 2024-03-03 NOTE — Anesthesia Procedure Notes (Signed)
 Procedure Name: LMA Insertion Date/Time: 03/03/2024 10:42 AM  Performed by: Won Kreuzer, Corean BROCKS, CRNAPre-anesthesia Checklist: Patient identified, Emergency Drugs available, Suction available and Patient being monitored Patient Re-evaluated:Patient Re-evaluated prior to induction Oxygen  Delivery Method: Circle system utilized Preoxygenation: Pre-oxygenation with 100% oxygen  Induction Type: IV induction Ventilation: Mask ventilation without difficulty LMA: LMA inserted LMA Size: 4.0 Number of attempts: 1 Airway Equipment and Method: Bite block Placement Confirmation: positive ETCO2 Tube secured with: Tape Dental Injury: Teeth and Oropharynx as per pre-operative assessment

## 2024-03-03 NOTE — Progress Notes (Signed)
 Pre Procedure note for inpatients:   James Jennings has been scheduled for Procedures: INCISION AND DRAINAGE, ABSCESS, PERIRECTAL (N/A) today. The various methods of treatment have been discussed with the patient. After consideration of the risks, benefits and treatment options the patient has consented to the planned procedure.   The patient has been seen and labs reviewed. There are no changes in the patients condition to prevent proceeding with the planned procedure today.  Recent labs:  Lab Results  Component Value Date   WBC 4.7 03/03/2024   HGB 13.1 03/03/2024   HCT 38.8 (L) 03/03/2024   PLT 209 03/03/2024   GLUCOSE 146 (H) 03/03/2024   CHOL 132 12/02/2021   TRIG 92 12/02/2021   HDL 37 (L) 12/02/2021   LDLCALC 77 12/02/2021   ALT 14 03/03/2024   AST 16 03/03/2024   NA 139 03/03/2024   K 3.6 03/03/2024   CL 103 03/03/2024   CREATININE 0.91 03/03/2024   BUN 9 03/03/2024   CO2 28 03/03/2024   TSH 2.603 07/16/2020   INR 0.9 12/01/2021   HGBA1C 6.4 (H) 03/02/2024    Herlene Beverley Bureau, MD 03/03/2024 9:50 AM

## 2024-03-03 NOTE — Op Note (Signed)
 Preoperative diagnosis: perirectal abscess  Postoperative diagnosis: same   Procedure: rectal exam under anesthesia  Surgeon: Herlene Bureau, M.D.  Asst: none  Anesthesia: general LMA  Indications for procedure: James Jennings is a 54 y.o. year old male with symptoms of perianal pain. He had a CT scan concerning for abscess and was brought into the operating room.  Description of procedure: The patient was brought into the operative suite. Anesthesia was administered with General LMA anesthesia. WHO checklist was applied. The patient was then placed in lithotomy position. The area was prepped and draped in the usual sterile fashion.  Next, the area was palpated and digital rectal exam performed. There was a thickening over the posterior right side. Local was injected into the area. A stab incision was made and hemostat used to palpate the deeper area. No purulence was identified. Palpation was continued with internal digital palpation performed to help guidance and the firmness was entered but no drainage or purulent expression. Next, a anal retractor was placed and the canal inspected in all areas. No internal opening was identified. No purulence was seen. No masses or polyps were seen. A small incision was made over the firmness internally with no expression of pus. The incision was closed with 2-0 vicryl. Gelfoam was put into the anal canal. He awoke from anesthesia and was brought to PACU in stable condition.  Findings: no abscess identified  Specimen: none  Implant: gelfoam packing   Blood loss: 20 ml  Local anesthesia: 10 ml marcaine    Complications: none  Herlene Bureau, M.D. General, Bariatric, & Minimally Invasive Surgery [facility]

## 2024-03-03 NOTE — Discharge Summary (Signed)
 [facility] Discharge Summary   Patient ID: James Jennings MRN: [id number] DOB/AGE: 07-06-1970 54 y.o.  Admit date: 03/02/2024 Discharge date: 03/03/2024  Admitting Diagnosis: Perirectal abscess  Discharge Diagnosis Patient Active Problem List   Diagnosis Date Noted   Perirectal abscess 03/02/2024   Hematochezia 09/23/2022   Bloating symptom 09/23/2022   Change in bowel habits 09/23/2022   Hemorrhoids 09/23/2022   Heme + stool 09/23/2022   Change in stool caliber 09/23/2022   Antiplatelet or antithrombotic long-term use 09/23/2022   Hypogammaglobulinemia 06/15/2022   AKI (acute kidney injury) 06/04/2022   PNA (pneumonia) 06/03/2022   Stroke (cerebrum) ([name]) 12/01/2021   Acute ischemic stroke ([name]) 12/01/2021   Palpitations 05/08/2021   Febrile illness    Septic shock ([name]) 02/26/2016   Follicular lymphoma grade III of intra-abdominal lymph nodes ([name]) 01/30/2016   Acute appendicitis with rupture 11/13/2015   Acute appendicitis 11/12/2015   Tobacco use 06/25/2011   ACHILLES BURSITIS OR TENDINITIS 10/28/2009    Consultants None  Imaging: No results found.  Procedures Dr. Stevie 03/03/24 - rectal exam under anesthesia   Hospital Course:   54 yo male with 3 weeks of rectal pain. Pain started shortly after endoscopy. Pain is worse with defecation and he sometimes feels febrile after bowel movements. Pain is sharp in nature. CT performed as an outpatient 1/28 showed possible right perirectal abscess. He has some drainage per rectum at home and is wearing a pad.  Patient was admitted and underwent procedure listed above.  Tolerated procedure well and was transferred to the floor.  Diet was advanced as tolerated.  On POD#0, the patient was voiding well, tolerating diet, ambulating well, pain well controlled, vital signs stable, incisions c/d/i and felt stable for discharge home. He will complete his outpatient prescription for augmentin . Patient will follow up in our  office as below and knows to call with questions or concerns.      Physical Exam: General:  Alert, NAD, pleasant, comfortable Abd:  Soft, ND  Allergies as of 03/03/2024       Reactions   Mango Flavoring Agent (non-screening) Swelling, Other (See Comments)   LIPS SWELL   Rituximab  Other (See Comments)   Blood pressure went extremely low.    Shellfish Allergy Anaphylaxis   Lactose Intolerance (gi) Diarrhea        Medication List     STOP taking these medications    Advil  Dual Action 125-250 MG Tabs Generic drug: Ibuprofen -Acetaminophen        TAKE these medications    acetaminophen  500 MG tablet Commonly known as: TYLENOL  Take 2 tablets (1,000 mg total) by mouth every 6 (six) hours as needed.   ALPRAZolam  0.25 MG tablet Commonly known as: XANAX  Take 0.25 mg by mouth 2 (two) times daily as needed for anxiety.   amoxicillin -clavulanate 875-125 MG tablet Commonly known as: AUGMENTIN  Take 1 tablet by mouth 2 (two) times daily for 14 days.   aspirin  EC 81 MG tablet Take 1 tablet (81 mg total) by mouth daily. Swallow whole.   cetirizine 10 MG tablet Commonly known as: ZYRTEC Take 10 mg by mouth daily as needed for allergies.   escitalopram  5 MG tablet Commonly known as: LEXAPRO  Take 5 mg by mouth daily.   Farxiga  10 MG Tabs tablet Generic drug: dapagliflozin  propanediol Take 1 tablet (10 mg total) by mouth daily.   ibuprofen  400 MG tablet Commonly known as: ADVIL  Take 1 tablet (400 mg total) by mouth every 6 (six) hours as needed.  ipratropium 0.03 % nasal spray Commonly known as: ATROVENT  Place 2 sprays into both nostrils 2 (two) times daily as needed for rhinitis.   lidocaine  5 % ointment Commonly known as: XYLOCAINE  Apply 1 Application topically as needed.   lidocaine -prilocaine  cream Commonly known as: EMLA  Apply 1 Application topically as needed. Apply to [facility] 1 hour prior to procedure.   metFORMIN  500 MG tablet Commonly known as:  GLUCOPHAGE  Take 500 mg by mouth daily.   Mounjaro  10 MG/0.5ML Pen Generic drug: tirzepatide  Inject 10 mg into the skin once a week.   ondansetron  4 MG disintegrating tablet Commonly known as: ZOFRAN -ODT Take 4 mg by mouth as needed.   polyethylene glycol 17 g packet Commonly known as: MIRALAX  / GLYCOLAX  Take 17 g by mouth daily as needed for mild constipation.   rosuvastatin  40 MG tablet Commonly known as: CRESTOR  TAKE 1 TABLET BY MOUTH EVERY DAY   traMADol  50 MG tablet Commonly known as: ULTRAM  Take 1 tablet (50 mg total) by mouth every 6 (six) hours as needed.          Follow-up Information     Maczis, Puja Gosai, PA-C. Go on 03/23/2024.   Specialty: General Surgery Why: at 3:30 PM for post-operative follow up. please arrive 20 minutes prior to your appointment. Contact information: [address] [place] [address] [phone]                 Signed: Almarie Pringle, [facility] 03/03/2024, 2:34 PM

## 2024-03-03 NOTE — Anesthesia Preprocedure Evaluation (Signed)
"                                    Anesthesia Evaluation  Patient identified by MRN, date of birth, ID band Patient awake    Reviewed: Allergy & Precautions, NPO status , Patient's Chart, lab work & pertinent test results  History of Anesthesia Complications (+) PONV and history of anesthetic complications  Airway Mallampati: III  TM Distance: <3 FB     Dental  (+) Teeth Intact, Dental Advisory Given   Pulmonary sleep apnea , former smoker   breath sounds clear to auscultation       Cardiovascular negative cardio ROS  Rhythm:Regular Rate:Normal     Neuro/Psych   Anxiety     CVA    GI/Hepatic negative GI ROS, Neg liver ROS,,,  Endo/Other  diabetes    Renal/GU Renal disease     Musculoskeletal negative musculoskeletal ROS (+)    Abdominal   Peds  Hematology negative hematology ROS (+)   Anesthesia Other Findings   Reproductive/Obstetrics                              Anesthesia Physical Anesthesia Plan  ASA: 2  Anesthesia Plan: General   Post-op Pain Management: Tylenol  PO (pre-op)*, Toradol  IV (intra-op)* and Dilaudid  IV   Induction: Intravenous  PONV Risk Score and Plan: 4 or greater and Ondansetron , Dexamethasone , Midazolam  and Scopolamine patch - Pre-op  Airway Management Planned: LMA  Additional Equipment: None  Intra-op Plan:   Post-operative Plan: Extubation in OR  Informed Consent: I have reviewed the patients History and Physical, chart, labs and discussed the procedure including the risks, benefits and alternatives for the proposed anesthesia with the patient or authorized representative who has indicated his/her understanding and acceptance.     Dental advisory given  Plan Discussed with: CRNA  Anesthesia Plan Comments:         Anesthesia Quick Evaluation  "

## 2024-03-03 NOTE — Plan of Care (Signed)

## 2024-03-04 ENCOUNTER — Encounter (HOSPITAL_COMMUNITY): Payer: Self-pay | Admitting: General Surgery

## 2024-03-06 ENCOUNTER — Encounter: Admitting: Gastroenterology

## 2024-03-06 LAB — MISC LABCORP TEST (SEND OUT): Labcorp test code: 83935

## 2024-04-05 ENCOUNTER — Inpatient Hospital Stay

## 2024-04-05 ENCOUNTER — Inpatient Hospital Stay: Admitting: Hematology & Oncology

## 2024-04-18 ENCOUNTER — Ambulatory Visit: Admitting: Neurology
